# Patient Record
Sex: Female | Born: 1970
Health system: Southern US, Community
[De-identification: ages and names within clinical notes are randomized; demographics above are authoritative.]

## PROBLEM LIST (undated history)

## (undated) DIAGNOSIS — E7800 Pure hypercholesterolemia, unspecified: Secondary | ICD-10-CM

## (undated) DIAGNOSIS — R112 Nausea with vomiting, unspecified: Secondary | ICD-10-CM

## (undated) DIAGNOSIS — G4733 Obstructive sleep apnea (adult) (pediatric): Secondary | ICD-10-CM

## (undated) DIAGNOSIS — K449 Diaphragmatic hernia without obstruction or gangrene: Secondary | ICD-10-CM

## (undated) DIAGNOSIS — E282 Polycystic ovarian syndrome: Secondary | ICD-10-CM

## (undated) DIAGNOSIS — I10 Essential (primary) hypertension: Secondary | ICD-10-CM

## (undated) DIAGNOSIS — G43909 Migraine, unspecified, not intractable, without status migrainosus: Secondary | ICD-10-CM

## (undated) DIAGNOSIS — F419 Anxiety disorder, unspecified: Secondary | ICD-10-CM

## (undated) DIAGNOSIS — F329 Major depressive disorder, single episode, unspecified: Secondary | ICD-10-CM

## (undated) DIAGNOSIS — K219 Gastro-esophageal reflux disease without esophagitis: Secondary | ICD-10-CM

## (undated) DIAGNOSIS — K529 Noninfective gastroenteritis and colitis, unspecified: Secondary | ICD-10-CM

## (undated) DIAGNOSIS — Z9989 Dependence on other enabling machines and devices: Secondary | ICD-10-CM

## (undated) DIAGNOSIS — M1990 Unspecified osteoarthritis, unspecified site: Secondary | ICD-10-CM

## (undated) DIAGNOSIS — Z87820 Personal history of traumatic brain injury: Secondary | ICD-10-CM

## (undated) DIAGNOSIS — E785 Hyperlipidemia, unspecified: Secondary | ICD-10-CM

## (undated) DIAGNOSIS — G4730 Sleep apnea, unspecified: Secondary | ICD-10-CM

## (undated) DIAGNOSIS — Z9889 Other specified postprocedural states: Secondary | ICD-10-CM

## (undated) DIAGNOSIS — E119 Type 2 diabetes mellitus without complications: Secondary | ICD-10-CM

## (undated) DIAGNOSIS — K760 Fatty (change of) liver, not elsewhere classified: Secondary | ICD-10-CM

## (undated) DIAGNOSIS — F319 Bipolar disorder, unspecified: Secondary | ICD-10-CM

## (undated) DIAGNOSIS — I2510 Atherosclerotic heart disease of native coronary artery without angina pectoris: Secondary | ICD-10-CM

## (undated) DIAGNOSIS — R51 Headache: Secondary | ICD-10-CM

## (undated) DIAGNOSIS — K5730 Diverticulosis of large intestine without perforation or abscess without bleeding: Secondary | ICD-10-CM

## (undated) DIAGNOSIS — Z8719 Personal history of other diseases of the digestive system: Secondary | ICD-10-CM

## (undated) DIAGNOSIS — T4145XA Adverse effect of unspecified anesthetic, initial encounter: Secondary | ICD-10-CM

## (undated) HISTORY — PX: ANAL FISSURE REPAIR: SHX2312

## (undated) HISTORY — PX: KNEE ARTHROSCOPY: SUR90

## (undated) HISTORY — PX: LAPAROSCOPIC CHOLECYSTECTOMY: SUR755

## (undated) HISTORY — DX: Sleep apnea, unspecified: G47.30

## (undated) HISTORY — DX: Headache: R51

## (undated) HISTORY — PX: EVALUATION UNDER ANESTHESIA WITH TEAR DUCT PROBING: SHX5620

---

## 1992-07-26 HISTORY — PX: KNEE ARTHROSCOPY: SUR90

## 2002-06-20 ENCOUNTER — Ambulatory Visit (HOSPITAL_COMMUNITY): Admission: RE | Admit: 2002-06-20 | Discharge: 2002-06-20 | Payer: Self-pay | Admitting: General Surgery

## 2002-07-26 HISTORY — PX: CHOLECYSTECTOMY: SHX55

## 2002-08-06 ENCOUNTER — Ambulatory Visit (HOSPITAL_COMMUNITY): Admission: RE | Admit: 2002-08-06 | Discharge: 2002-08-06 | Payer: Self-pay | Admitting: General Surgery

## 2007-07-27 HISTORY — PX: BACK SURGERY: SHX140

## 2007-08-04 ENCOUNTER — Ambulatory Visit (HOSPITAL_COMMUNITY): Admission: RE | Admit: 2007-08-04 | Discharge: 2007-08-05 | Payer: Self-pay | Admitting: Neurosurgery

## 2007-10-04 ENCOUNTER — Encounter: Admission: RE | Admit: 2007-10-04 | Discharge: 2007-10-04 | Payer: Self-pay | Admitting: Neurosurgery

## 2007-11-20 ENCOUNTER — Emergency Department (HOSPITAL_COMMUNITY): Admission: EM | Admit: 2007-11-20 | Discharge: 2007-11-20 | Payer: Self-pay | Admitting: Emergency Medicine

## 2008-03-05 ENCOUNTER — Ambulatory Visit (HOSPITAL_COMMUNITY): Admission: RE | Admit: 2008-03-05 | Discharge: 2008-03-05 | Payer: Self-pay | Admitting: Neurology

## 2009-08-13 ENCOUNTER — Encounter: Admission: RE | Admit: 2009-08-13 | Discharge: 2009-08-13 | Payer: Self-pay | Admitting: Neurology

## 2010-12-08 NOTE — Op Note (Signed)
Jamie [name], Jamie [name]               ACCOUNT NO.:  [id number]   MEDICAL RECORD NO.:  [id number]          PATIENT TYPE:  AMB   LOCATION:  SDS                          FACILITY:  [facility]   PHYSICIAN:  Hilda Lias, M.D.   DATE OF BIRTH:  1971/06/25   DATE OF PROCEDURE:  08/04/2007  DATE OF DISCHARGE:                               OPERATIVE REPORT   PREOPERATIVE DIAGNOSES:  1. Right L4-L5 herniated disk with radiculopathy.  2. Morbid obesity.   POSTOPERATIVE DIAGNOSES:  1. Right L4-L5 herniated disk with radiculopathy.  2. Morbid obesity.   PROCEDURE:  1. Right L4-5 diskectomy.  2. Decompression of the L5 nerve root.  3. Microscope.   SURGEON:  Botero   ASSISTANT:  Dr. Newell Coral.   CLINICAL HISTORY:  [name] is a lady who came to see me in my office a  few days ago because of back pain rushing through the right leg.  The  patient has failed conservative treatment.  X-rays showed that she has a  herniated disk compromising the L5 nerve root.  The patient wanted to  proceed with surgery.  The risks were explained to her in the history  and physical.   PROCEDURE:  [name] was taken to the OR and she was positioned in a  prone manner.  The skin was prepped with DuraPrep.  It was difficult to  feel any spinous process.  Nevertheless, with an incision right below  the iliac crest line, incision was carried down through the skin,  subcutaneous tissues, throughout the deep adipose tissue about 3 inches  all the way down to the fascia.  Muscles and fascia were retracted  laterally.  X-ray showed __________ L4-5 and __________ L5-S1.  We  brought the microscope into the area and with the drill, we did a  laminotomy of L4-5.  The yellow ligament also was excised.  What we  found was that the nerve root was completely adherent to the floor.  There was a large herniated disk with __________ incorporated into one  calcified and one soft.  Incision was made and 3 fragments were removed.  We entered disk space and using the microcurette, we were able to remove  the part of the lateral calcified disk.  At the end, total gross  diskectomy was achieved.  We had plenty of room for the L4 and L5 nerve  root.  The area was irrigated and Valsalva maneuver was negative.  Fentanyl and Depo-Medrol were left in the epidural space and the wound  was closed with Vicryl and Steri-Strips.          ______________________________  Hilda Lias, M.D.    EB/MEDQ  D:  08/04/2007  T:  08/05/2007  Job:  [id number]

## 2010-12-11 NOTE — Op Note (Signed)
Jamie [name], GAVITT                         ACCOUNT NO.:  [id number]   MEDICAL RECORD NO.:  [id number]                   PATIENT TYPE:  AMB   LOCATION:  DAY                                  FACILITY:  [facility]   PHYSICIAN:  Dalia Heading, M.D.               DATE OF BIRTH:  June 07, 1971   DATE OF PROCEDURE:  08/06/2002  DATE OF DISCHARGE:                                 OPERATIVE REPORT   PREOPERATIVE DIAGNOSIS:  Chronic cholecystitis.   POSTOPERATIVE DIAGNOSIS:  Chronic cholecystitis.   PROCEDURE:  Laparoscopic cholecystectomy.   ASSISTANT:  Bernerd Limbo. Leona Carry, M.D.   ANESTHESIA:  General endotracheal.   INDICATIONS:  The patient is a 40 year old white female who presents with  nausea and vomiting secondary to chronic cholecystitis.  The risks and  benefits of the procedure, including bleeding, infection, hepatobiliary  injury, and the possibility of an open procedure were fully explained to the  patient, who gave informed consent.   DESCRIPTION OF PROCEDURE:  The patient was placed in the supine position.  After induction of general endotracheal anesthesia, the abdomen was prepped  and draped using the usual sterile technique with Betadine.   A supraumbilical incision was made down to the fascia.  A Veress needle was  introduced into the abdominal cavity and confirmation of placement was done  using the saline drop test.  The abdomen was then insufflated to 16 mmHg  pressure.  An 11-mm trocar was introduced into the abdominal cavity under  direct visualization without difficulty.  The patient was placed in reversed  Trendelenburg position and an additional 11-mm trocar was placed in the  epigastric region and 5-mm trocars were placed in the right upper quadrant  and right flank regions.  The liver was inspected and noted to be within  normal limits.  The gallbladder was retracted superiorly and laterally.  The  dissection was begun around the infundibulum of the gallbladder.   The cystic  duct was first identified.  Its junction to the infundibulum was well  identified.  Endo clips were placed proximally and distally on the cystic  duct and the cystic duct was divided.  This was likewise done on the cystic  artery.  The gallbladder was then freed away from the gallbladder fossa  using Bovie electrocautery.  The gallbladder was delivered through the  epigastric trocar site without difficulty.  The gallbladder fossa was  inspected and no abnormal bleeding or bile leakage was noted.  Surgicel was  placed on the gallbladder fossa.  The subhepatic space as well as the right  hepatic gutter were irrigated with normal saline.  All fluid and air were  then evacuated from the abdominal cavity prior to removal of the trocars.   All wounds were irrigated with normal saline.  All wounds were injected with  0.5% Sensorcaine.  The supraumbilical fascia was reapproximated using an 0  Vicryl interrupted suture.  All skin incisions were closed using staples.  Betadine ointment and dry sterile dressings were applied.   All instrument and needle counts were correct at the end of the procedure.  The patient was extubated in the operating room and went back to the  recovery room awake and in stable condition.   COMPLICATIONS:  None.   SPECIMEN:  Gallbladder.   BLOOD LOSS:  Minimal.                                                Dalia Heading, M.D.    MAJ/MEDQ  D:  08/06/2002  T:  08/06/2002  Job:  [id number]   cc:   [name]  [address] [place]  [place] [address]  Fax: [phone]

## 2010-12-11 NOTE — H&P (Signed)
   NAMEADALINA, Jamie [name]                           ACCOUNT NO.:  [id number]   MEDICAL RECORD NO.:  [id number]                  PATIENT TYPE:   LOCATION:                                       FACILITY:   PHYSICIAN:  Dalia Heading, M.D.               DATE OF BIRTH:   DATE OF ADMISSION:  DATE OF DISCHARGE:                                HISTORY & PHYSICAL   CHIEF COMPLAINT:  Chronic cholecystitis.   HISTORY OF PRESENT ILLNESS:  The patient is a 40 year old white female who  is referred for evaluation and treatment of chronic cholecystitis.  She has  been having increased nausea and intermittent right upper quadrant abdominal  pain recently.  This was occurring with all foods.  No fever, chills, or  jaundice had been noted.   PAST MEDICAL HISTORY:  1. Non-insulin-dependent diabetes mellitus.  2. Hypertension.   PAST SURGICAL HISTORY:  1. Knee surgery.  2. EGD and colonoscopy in November 2003.   CURRENT MEDICATIONS:  1. Altace 2.5 mg p.o. daily.  2. Nexium 40 mg p.o. daily.  3. Glucophage XR 500 mg p.o. two tablets once daily.   ALLERGIES:  CODEINE but she can take Vicodin without difficulty.   REVIEW OF SYSTEMS:  Noncontributory.   PHYSICAL EXAMINATION:  GENERAL:  The patient is a well-developed, well-  nourished white female in no acute distress.  VITAL SIGNS:  She is afebrile and vital signs are stable.  LUNGS:  Clear to auscultation with equal breath sounds bilaterally.  HEART:  Regular rate and rhythm without S3, S4, or murmurs.  ABDOMEN:  Slightly tender in the right upper quadrant to palpation.  No  hepatosplenomegaly, masses, or hernias are noted.   LABORATORY DATA:  Ultrasound of the gallbladder is negative.  Hepatobiliary  scan reveals chronic cholecystitis with a low gallbladder ejection fraction.   IMPRESSION:  Chronic cholecystitis.   PLAN:  The patient is scheduled for laparoscopic cholecystectomy on August 06, 2002.  The risks and benefits of the  procedure including bleeding,  infection, hepatobiliary injury, and the possibility of an open procedure  were fully explained to the patient, who gave informed consent.                                               Dalia Heading, M.D.    MAJ/MEDQ  D:  08/02/2002  T:  08/02/2002  Job:  [id number]   cc:   [name]  [address] [place]  [place] [address]  Fax: [phone]

## 2010-12-11 NOTE — H&P (Signed)
   Jamie [name], MATNEY                           ACCOUNT NO.:  [id number]   MEDICAL RECORD NO.:  [id number]                  PATIENT TYPE:   LOCATION:                                       FACILITY:   PHYSICIAN:  Dalia Heading, M.D.               DATE OF BIRTH:  05/03/1971   DATE OF ADMISSION:  DATE OF DISCHARGE:                                HISTORY & PHYSICAL   CHIEF COMPLAINT:  Hematochezia, hematemesis.   HISTORY OF PRESENT ILLNESS:  The patient is a 40 year old white female who  is referred for endoscopic evaluation.  She has been having episodes of  hematochezia for the past month.  Also, last week she had two episodes of  hematemesis.  She denies any lightheadedness, weight loss, fever, abdominal  pain, constipation, diarrhea, or melena.  She has never had a colonoscopy.  She denies any hemorrhoidal problems or immediate family history of colon  carcinoma.   PAST MEDICAL HISTORY:  Hypertension, non-insulin-dependent diabetes  mellitus.   PAST SURGICAL HISTORY:  Knee surgery.   CURRENT MEDICATIONS:  Altace, Glucophage.   ALLERGIES:  CODEINE.   REVIEW OF SYSTEMS:  Noncontributory.   PHYSICAL EXAMINATION:  GENERAL:  The patient is a well-developed, well-  nourished white female in no acute distress.  VITAL SIGNS:  She is afebrile and vital signs are stable.  LUNGS:  Clear to auscultation with equal breath sounds bilaterally.  HEART:  Regular rate and rhythm without S3, S4, or murmurs.  ABDOMEN:  Soft, nontender, nondistended.  No hepatosplenomegaly or masses  are noted.  RECTAL:  Deferred until the procedure.   IMPRESSION:  Hematochezia, hematemesis.    PLAN:  The patient was scheduled for an EGD and colonoscopy on June 20, 2002.  The risks and benefits of the procedures including bleeding and  perforation were fully explained to the patient, gave informed consent.                                               Dalia Heading, M.D.    MAJ/MEDQ  D:   06/14/2002  T:  06/14/2002  Job:  [id number]   cc:   Donzetta Sprung  [address]  [place]  [place] [address]  Fax: [phone]

## 2010-12-22 DIAGNOSIS — K219 Gastro-esophageal reflux disease without esophagitis: Secondary | ICD-10-CM | POA: Insufficient documentation

## 2011-04-15 LAB — CBC
HCT: 42.3
MCHC: 34.6
MCV: 83
Platelets: 245
RDW: 14.2

## 2011-04-15 LAB — BASIC METABOLIC PANEL
BUN: 14
CO2: 25
Calcium: 9.7
GFR calc Af Amer: >60
Glucose, Bld: 121 — ABNORMAL HIGH

## 2011-04-20 LAB — PREGNANCY, URINE: Preg Test, Ur: NEGATIVE

## 2011-04-20 LAB — URINALYSIS, ROUTINE W REFLEX MICROSCOPIC
Glucose, UA: NEGATIVE
Specific Gravity, Urine: >1.03 — ABNORMAL HIGH

## 2011-04-20 LAB — URINE MICROSCOPIC-ADD ON

## 2011-04-23 LAB — CREATININE, SERUM: GFR calc non Af Amer: >60

## 2011-04-23 LAB — BUN: BUN: 11

## 2011-06-07 ENCOUNTER — Encounter: Payer: Self-pay | Admitting: *Deleted

## 2011-06-07 ENCOUNTER — Ambulatory Visit (HOSPITAL_COMMUNITY)
Admission: RE | Admit: 2011-06-07 | Discharge: 2011-06-07 | Disposition: A | Discharge status: Home / Self Care | Payer: Medicare Other | Attending: Psychiatry | Admitting: Psychiatry

## 2011-06-07 ENCOUNTER — Emergency Department (HOSPITAL_COMMUNITY)
Admission: EM | Admit: 2011-06-07 | Discharge: 2011-06-08 | Disposition: A | Discharge status: Other Acute Inpatient Hospital | Payer: Medicare Other | Source: Home / Self Care | Attending: Emergency Medicine | Admitting: Emergency Medicine

## 2011-06-07 DIAGNOSIS — E7800 Pure hypercholesterolemia, unspecified: Secondary | ICD-10-CM | POA: Insufficient documentation

## 2011-06-07 DIAGNOSIS — R45851 Suicidal ideations: Secondary | ICD-10-CM | POA: Insufficient documentation

## 2011-06-07 DIAGNOSIS — F341 Dysthymic disorder: Secondary | ICD-10-CM | POA: Insufficient documentation

## 2011-06-07 DIAGNOSIS — I10 Essential (primary) hypertension: Secondary | ICD-10-CM | POA: Insufficient documentation

## 2011-06-07 DIAGNOSIS — F319 Bipolar disorder, unspecified: Secondary | ICD-10-CM | POA: Insufficient documentation

## 2011-06-07 DIAGNOSIS — F332 Major depressive disorder, recurrent severe without psychotic features: Secondary | ICD-10-CM | POA: Insufficient documentation

## 2011-06-07 DIAGNOSIS — Z79899 Other long term (current) drug therapy: Secondary | ICD-10-CM | POA: Insufficient documentation

## 2011-06-07 HISTORY — DX: Essential (primary) hypertension: I10

## 2011-06-07 HISTORY — DX: Major depressive disorder, single episode, unspecified: F32.9

## 2011-06-07 HISTORY — DX: Anxiety disorder, unspecified: F41.9

## 2011-06-07 HISTORY — DX: Bipolar disorder, unspecified: F31.9

## 2011-06-07 HISTORY — DX: Pure hypercholesterolemia, unspecified: E78.00

## 2011-06-07 LAB — CBC
HCT: 38.7 % (ref 36.0–46.0)
MCHC: 33.1 g/dL (ref 30.0–36.0)
MCV: 88.6 fL (ref 78.0–100.0)
RDW: 14.2 % (ref 11.5–15.5)

## 2011-06-07 NOTE — ED Notes (Signed)
Pt in from [facility], requesting medical clearance, pt c/o SI x2 months and increased today, pt states she was recently at [facility]

## 2011-06-07 NOTE — BH Assessment (Signed)
Assessment Note   Jamie [name] is an 40 y.o. female who presents to [facility] for assessment due to suicidal ideation. Patient states that she broke up with her boyfriend 2 months ago and has since had "overwhelming" suicidal thoughts. She states that she has been admitted to [facility] inpatient twice in the past 2 months. She states that each time was due to her feeling overwhelmed with wanting to commit suicide. Patient states that she has never attempted suicide but states that she "wants to die." Patient has no current plan but stated that she has access to a lot of medication. Patient reports that she lives alone. She states that she receives social from her sister, mother, and best friend. Patient reports having a decreased appetite, stating that she has lost 40 lbs in the past 12 months, 20 lbs of which were in the last 2 months. Patient states she has been unable to sleep, stating she is currently getting between 4 and 6 hours a night. Patient denies any homicidal thoughts, any previous abuse, substance abuse, and visiual hallucinations. Patient reports that she use to have auditory hallucinations 2 years ago but has since been put on medication. She states she has not had any auditory hallucinations since that time. Patient was unable to contract for safety. She also states that she is not comfortable with intensive outpatient therapy, stating she feels she needs more structure and supervision at this time.       Axis I: Major Depression, Recurrent severe Axis II: Deferred Axis III: No past medical history on file. Axis IV: problems related to social environment Axis V: 31-40 impairment in reality testing  Past Medical History: No past medical history on file.  No past surgical history on file.  Family History: No family history on file.  Social History:  does not have a smoking history on file. She does not have any smokeless tobacco history on file. Her alcohol and drug histories  not on file.  Allergies:  Allergies  Allergen Reactions  . Codeine     Home Medications:  No current outpatient prescriptions on file as of 06/07/2011.   No current facility-administered medications on file as of 06/07/2011.    OB/GYN Status:  No LMP recorded.  General Assessment Data Assessment Number: 1  Living Arrangements: Alone Can pt return to current living arrangement?: Yes Admission Status: Voluntary Is patient capable of signing voluntary admission?: Yes Transfer from: Home Referral Source: Psychiatrist  Risk to self Suicidal Ideation: Yes-Currently Present Suicidal Intent: Yes-Currently Present Is patient at risk for suicide?: Yes Suicidal Plan?: No (s) Access to Means: Yes Specify Access to Suicidal Means: medication What has been your use of drugs/alcohol within the last 12 months?: none Other Self Harm Risks: none known Triggers for Past Attempts: Other personal contacts (break up with boyfriend) Intentional Self Injurious Behavior: None Factors that decrease suicide risk: Positive social support Family Suicide History: Unknown Recent stressful life event(s): Other (Comment) (break up with boyfriend) Persecutory voices/beliefs?: No Depression: Yes Depression Symptoms: Tearfulness;Loss of interest in usual pleasures Substance abuse history and/or treatment for substance abuse?: No Suicide prevention information given to non-admitted patients: Not applicable  Risk to Others Homicidal Ideation: No Thoughts of Harm to Others: No Current Homicidal Intent: No Current Homicidal Plan: No Access to Homicidal Means: No Identified Victim: none History of harm to others?: No Assessment of Violence: None Noted Violent Behavior Description: none Does patient have access to weapons?: No Criminal Charges Pending?: No Does  patient have a court date: No  Mental Status Report Appear/Hygiene: Disheveled Eye Contact: Fair Motor Activity:  (slowed) Speech:  Logical/coherent Level of Consciousness: Alert;Drowsy Mood: Depressed (hopelessness) Affect:  (flat) Anxiety Level: None Thought Processes: Coherent;Relevant Judgement: Unimpaired Orientation: Person;Place;Time;Situation Obsessive Compulsive Thoughts/Behaviors: None  Cognitive Functioning Concentration: Normal Memory: Recent Intact IQ: Average Insight: Poor Impulse Control: Poor Appetite: Poor Weight Loss: 40  Weight Gain: 0  Sleep: Decreased Total Hours of Sleep: 4  Vegetative Symptoms: None  Prior Inpatient/Outpatient Therapy Prior Therapy: Inpatient Prior Therapy Dates: last 2 months Prior Therapy Facilty/Provider(s): [facility] Reason for Treatment: sucidial ideation  ADL Screening (condition at time of admission) Patient's cognitive ability adequate to safely complete daily activities?: Yes Patient able to express need for assistance with ADLs?: Yes Independently performs ADLs?: Yes Weakness of Legs: None Weakness of Arms/Hands: None  Home Assistive Devices/Equipment Home Assistive Devices/Equipment: None    Abuse/Neglect Assessment (Assessment to be complete while patient is alone) Physical Abuse: Denies Verbal Abuse: Denies Sexual Abuse: Denies Exploitation of patient/patient's resources: Denies Self-Neglect: Denies Values / Beliefs Cultural Requests During Hospitalization: None Spiritual Requests During Hospitalization: None        Additional Information 1:1 In Past 12 Months?: No CIRT Risk: No Elopement Risk: No Does patient have medical clearance?: No     Disposition:  Disposition Disposition of Patient: Referred to [facility] for medical clearence ) Patient referred to:  [facility] for medical clearence)  Discussed patient with Thurman Coyer who cleared patient for Dr. Dan Humphreys at 21:00. Dr.Walker advised patient  Be transported to [facility] for medical clearance at 21:15.     On Site Evaluation by:   Reviewed with Physician:      Marjean Donna 06/07/2011 10:19 PM

## 2011-06-08 ENCOUNTER — Encounter (HOSPITAL_COMMUNITY): Payer: Self-pay | Admitting: *Deleted

## 2011-06-08 ENCOUNTER — Inpatient Hospital Stay (HOSPITAL_COMMUNITY)
Admission: AD | Admit: 2011-06-08 | Discharge: 2011-06-10 | DRG: 885 | Disposition: A | Discharge status: Home / Self Care | Payer: Medicare Other | Source: Ambulatory Visit | Attending: Psychiatry | Admitting: Psychiatry

## 2011-06-08 DIAGNOSIS — Z9181 History of falling: Secondary | ICD-10-CM

## 2011-06-08 DIAGNOSIS — E7800 Pure hypercholesterolemia, unspecified: Secondary | ICD-10-CM

## 2011-06-08 DIAGNOSIS — R51 Headache: Secondary | ICD-10-CM

## 2011-06-08 DIAGNOSIS — F319 Bipolar disorder, unspecified: Secondary | ICD-10-CM

## 2011-06-08 DIAGNOSIS — F3289 Other specified depressive episodes: Secondary | ICD-10-CM

## 2011-06-08 DIAGNOSIS — F251 Schizoaffective disorder, depressive type: Secondary | ICD-10-CM

## 2011-06-08 DIAGNOSIS — F259 Schizoaffective disorder, unspecified: Principal | ICD-10-CM

## 2011-06-08 DIAGNOSIS — M549 Dorsalgia, unspecified: Secondary | ICD-10-CM

## 2011-06-08 DIAGNOSIS — R45851 Suicidal ideations: Secondary | ICD-10-CM

## 2011-06-08 DIAGNOSIS — F411 Generalized anxiety disorder: Secondary | ICD-10-CM | POA: Diagnosis present

## 2011-06-08 DIAGNOSIS — F329 Major depressive disorder, single episode, unspecified: Secondary | ICD-10-CM

## 2011-06-08 DIAGNOSIS — I10 Essential (primary) hypertension: Secondary | ICD-10-CM

## 2011-06-08 DIAGNOSIS — Z87820 Personal history of traumatic brain injury: Secondary | ICD-10-CM

## 2011-06-08 LAB — COMPREHENSIVE METABOLIC PANEL
ALT: 10 U/L (ref 0–35)
Albumin: 3.8 g/dL (ref 3.5–5.2)
Alkaline Phosphatase: 52 U/L (ref 39–117)
Potassium: 4.4 mEq/L (ref 3.5–5.1)
Sodium: 139 mEq/L (ref 135–145)
Total Protein: 7 g/dL (ref 6.0–8.3)

## 2011-06-08 LAB — RAPID URINE DRUG SCREEN, HOSP PERFORMED
Amphetamines: NOT DETECTED
Barbiturates: NOT DETECTED
Cocaine: NOT DETECTED
Tetrahydrocannabinol: NOT DETECTED

## 2011-06-08 MED ORDER — MAGNESIUM HYDROXIDE 400 MG/5ML PO SUSP
30.0000 mL | Freq: Every day | ORAL | Status: DC | PRN
Start: 2011-06-08 — End: 2011-06-10

## 2011-06-08 MED ORDER — ALUM & MAG HYDROXIDE-SIMETH 200-200-20 MG/5ML PO SUSP: 30.0000 mL | ORAL | Status: DC | PRN

## 2011-06-08 MED ORDER — METFORMIN HCL 500 MG PO TABS: 2000.0000 mg | ORAL_TABLET | Freq: Every day | ORAL | Status: DC

## 2011-06-08 MED ORDER — ACETAMINOPHEN 325 MG PO TABS: 650.0000 mg | ORAL_TABLET | Freq: Four times a day (QID) | ORAL | Status: DC | PRN

## 2011-06-08 MED ORDER — CARBAMAZEPINE 200 MG PO TABS
200.0000 mg | ORAL_TABLET | Freq: Two times a day (BID) | ORAL | Status: DC
  Administered 2011-06-08 – 2011-06-10 (×4): 200 mg via ORAL
  Filled 2011-06-08 (×8): qty 1

## 2011-06-08 MED ORDER — CARBAMAZEPINE ER 200 MG PO CP12: 200.0000 mg | ORAL_CAPSULE | Freq: Two times a day (BID) | ORAL | Status: DC

## 2011-06-08 MED ORDER — ONDANSETRON 4 MG PO TBDP
4.0000 mg | ORAL_TABLET | Freq: Once | ORAL | Status: AC
  Administered 2011-06-08: 4 mg via ORAL
  Filled 2011-06-08: qty 1

## 2011-06-08 MED ORDER — PROPRANOLOL HCL 40 MG PO TABS
40.0000 mg | ORAL_TABLET | Freq: Three times a day (TID) | ORAL | Status: DC
  Administered 2011-06-08: 40 mg via ORAL

## 2011-06-08 MED ORDER — TOPIRAMATE 25 MG PO TABS
50.0000 mg | ORAL_TABLET | ORAL | Status: DC
  Administered 2011-06-08 – 2011-06-10 (×5): 50 mg via ORAL
  Filled 2011-06-08 (×2): qty 2
  Filled 2011-06-08: qty 1
  Filled 2011-06-08 (×2): qty 2
  Filled 2011-06-08: qty 1
  Filled 2011-06-08 (×2): qty 2

## 2011-06-08 MED ORDER — LISINOPRIL 20 MG PO TABS
20.0000 mg | ORAL_TABLET | Freq: Every day | ORAL | Status: DC
  Administered 2011-06-08 – 2011-06-10 (×3): 20 mg via ORAL
  Filled 2011-06-08 (×4): qty 1

## 2011-06-08 MED ORDER — CLONAZEPAM 1 MG PO TABS: 1.0000 mg | ORAL_TABLET | Freq: Two times a day (BID) | ORAL | Status: DC | PRN

## 2011-06-08 MED ORDER — CARBAMAZEPINE ER 200 MG PO CP12: 200.0000 mg | ORAL_CAPSULE | ORAL | Status: DC

## 2011-06-08 MED ORDER — METFORMIN HCL ER 500 MG PO TB24
2000.0000 mg | ORAL_TABLET | Freq: Every day | ORAL | Status: DC
  Administered 2011-06-08 – 2011-06-10 (×3): 2000 mg via ORAL
  Filled 2011-06-08 (×3): qty 4
  Filled 2011-06-08 (×2): qty 12
  Filled 2011-06-08: qty 4

## 2011-06-08 MED ORDER — PANTOPRAZOLE SODIUM 40 MG PO TBEC
40.0000 mg | DELAYED_RELEASE_TABLET | Freq: Every day | ORAL | Status: DC
  Administered 2011-06-08 – 2011-06-09 (×2): 40 mg via ORAL
  Filled 2011-06-08 (×3): qty 1

## 2011-06-08 MED ORDER — ROSIGLITAZONE-METFORMIN 2-1000 MG PO TABS: 1.0000 | ORAL_TABLET | Freq: Two times a day (BID) | ORAL | Status: DC

## 2011-06-08 MED ORDER — BUPROPION HCL ER (SR) 150 MG PO TB12
150.0000 mg | ORAL_TABLET | ORAL | Status: DC
  Administered 2011-06-08 – 2011-06-10 (×5): 150 mg via ORAL
  Filled 2011-06-08 (×2): qty 6
  Filled 2011-06-08: qty 1
  Filled 2011-06-08: qty 6
  Filled 2011-06-08 (×2): qty 1
  Filled 2011-06-08: qty 6
  Filled 2011-06-08 (×2): qty 1

## 2011-06-08 MED ORDER — MAGNESIUM HYDROXIDE 400 MG/5ML PO SUSP: 30.0000 mL | Freq: Every day | ORAL | Status: DC | PRN

## 2011-06-08 MED ORDER — PROPRANOLOL HCL 40 MG PO TABS
40.0000 mg | ORAL_TABLET | ORAL | Status: DC
  Administered 2011-06-08 – 2011-06-10 (×7): 40 mg via ORAL
  Filled 2011-06-08 (×12): qty 1

## 2011-06-08 MED ORDER — ROSUVASTATIN CALCIUM 20 MG PO TABS
20.0000 mg | ORAL_TABLET | Freq: Every day | ORAL | Status: DC
  Administered 2011-06-08 – 2011-06-10 (×3): 20 mg via ORAL
  Filled 2011-06-08 (×5): qty 1

## 2011-06-08 MED ORDER — ESCITALOPRAM OXALATE 10 MG PO TABS
10.0000 mg | ORAL_TABLET | Freq: Every day | ORAL | Status: DC
  Administered 2011-06-08 – 2011-06-10 (×3): 10 mg via ORAL
  Filled 2011-06-08 (×7): qty 1

## 2011-06-08 MED ORDER — ZIPRASIDONE HCL 60 MG PO CAPS
60.0000 mg | ORAL_CAPSULE | Freq: Two times a day (BID) | ORAL | Status: DC
  Administered 2011-06-08 – 2011-06-10 (×4): 60 mg via ORAL
  Filled 2011-06-08 (×7): qty 1

## 2011-06-08 NOTE — ED Notes (Signed)
Pt signed E signature to transfer to [facility]. Pt alert and oriented x4, ambulatory. Respirations even and unlabored. In no acute distress. Skin warm and dry. Denies needs.

## 2011-06-08 NOTE — ED Provider Notes (Signed)
History     CSN: [id number] Arrival date & time: 06/07/2011 11:04 PM   First MD Initiated Contact with Patient 06/08/11 0025      Chief Complaint  Patient presents with  . Medical Clearance    (Consider location/radiation/quality/duration/timing/severity/associated sxs/prior treatment) HPI Comments: Patient with chronic suicidal thoughts sees a psychiatrics and therapist on a regular basis was recently at [facility], discharged 10 24/12 Went to [facility] and sent here for labs and medical clerance  The history is provided by the patient.    Past Medical History  Diagnosis Date  . Hypertension   . High cholesterol   . Bipolar 1 disorder   . Depression   . Anxiety     History reviewed. No pertinent past surgical history.  History reviewed. No pertinent family history.  History  Substance Use Topics  . Smoking status: Never Smoker   . Smokeless tobacco: Not on file  . Alcohol Use: No    OB History    Grav Para Term Preterm Abortions TAB SAB Ect Mult Living                  Review of Systems  Constitutional: Negative.   HENT: Negative.   Eyes: Negative.   Respiratory: Negative.   Cardiovascular: Negative.   Genitourinary: Negative.   Musculoskeletal: Negative.   Neurological: Negative.   Hematological: Negative.   Psychiatric/Behavioral: Positive for suicidal ideas.    Allergies  Codeine  Home Medications   Current Outpatient Rx  Name Route Sig Dispense Refill  . BUPROPION HCL 100 MG PO TABS Oral Take 100 mg by mouth 2 (two) times daily.      [name] Kitchen CARBAMAZEPINE (ANTIPSYCHOTIC) 200 MG PO CP12 Oral Take 200 mg by mouth 2 (two) times daily.      [name] Kitchen CLONAZEPAM 1 MG PO TABS Oral Take 1 mg by mouth 2 (two) times daily as needed.      [name] Kitchen ESCITALOPRAM OXALATE 10 MG PO TABS Oral Take 10 mg by mouth daily.      [name] Kitchen LISINOPRIL 20 MG PO TABS Oral Take 20 mg by mouth daily.      [name] Kitchen METFORMIN HCL 500 MG PO TABS Oral Take 500 mg by mouth 2 (two) times daily with a meal.        . OMEPRAZOLE 20 MG PO CPDR Oral Take 20 mg by mouth daily.      [name] Kitchen PROPRANOLOL HCL 40 MG PO TABS Oral Take 40 mg by mouth 3 (three) times daily.      [name] Kitchen ROSUVASTATIN CALCIUM 20 MG PO TABS Oral Take 20 mg by mouth daily.      [name] Kitchen TIZANIDINE HCL 2 MG PO CAPS Oral Take 2 mg by mouth 3 (three) times daily.      . TOPIRAMATE 25 MG PO CPSP Oral Take 25 mg by mouth 2 (two) times daily.      . TRAZODONE HCL 100 MG PO TABS Oral Take 100 mg by mouth at bedtime.      [name] Kitchen ZIPRASIDONE HCL 60 MG PO CAPS Oral Take 60 mg by mouth 2 (two) times daily with a meal.      . ZOLPIDEM TARTRATE 10 MG PO TABS Oral Take 10 mg by mouth at bedtime as needed.      . BUPROPION HCL 100 MG PO TABS Oral Take 100 mg by mouth 2 (two) times daily.      [name] Kitchen CARBAMAZEPINE 200 MG PO TABS Oral Take 200 mg by mouth 2 (two)  times daily.      [name] Kitchen CLONAZEPAM 1 MG PO TABS Oral Take 1 mg by mouth 3 (three) times daily.      [name] Kitchen ESCITALOPRAM OXALATE 10 MG PO TABS Oral Take 10 mg by mouth daily.      [name] Kitchen LISINOPRIL 20 MG PO TABS Oral Take 20 mg by mouth daily.      [name] Kitchen OMEPRAZOLE 20 MG PO CPDR Oral Take 20 mg by mouth daily.      [name] Kitchen PROPRANOLOL HCL 40 MG PO TABS Oral Take 40 mg by mouth 2 (two) times daily.      [name] Kitchen ROSIGLITAZONE-METFORMIN 08-998 MG PO TABS Oral Take 1 tablet by mouth 2 (two) times daily with a meal.      . ROSUVASTATIN CALCIUM 20 MG PO TABS Oral Take 20 mg by mouth daily.      [name] Kitchen TIZANIDINE HCL 2 MG PO CAPS Oral Take 2 mg by mouth 3 (three) times daily.      . TOPIRAMATE 25 MG PO TABS Oral Take 50 mg by mouth 2 times daily at 12 noon and 4 pm.      . ZIPRASIDONE HCL 60 MG PO CAPS Oral Take 60 mg by mouth 2 (two) times daily with a meal.      . ZOLPIDEM TARTRATE 10 MG PO TABS Oral Take 10 mg by mouth at bedtime as needed.        BP 125/84  Pulse 76  Temp(Src) 99.4 F (37.4 C) (Oral)  Resp 20  SpO2 96%  Physical Exam  Constitutional: She is oriented to person, place, and time. She appears well-developed and well-nourished.  HENT:   Head: Atraumatic.  Eyes: EOM are normal.  Cardiovascular: Regular rhythm.   Pulmonary/Chest: Breath sounds normal.  Musculoskeletal: Normal range of motion.  Neurological: She is oriented to person, place, and time.  Skin: Skin is warm and dry.  Psychiatric: She has a normal mood and affect.    ED Course  Procedures (including critical care time)  Labs Reviewed  COMPREHENSIVE METABOLIC PANEL - Abnormal; Notable for the following:    Total Bilirubin 0.1 (*)    All other components within normal limits  CBC  ETHANOL  URINE RAPID DRUG SCREEN (HOSP PERFORMED)  POCT PREGNANCY, URINE  POCT PREGNANCY, URINE   No results found.   No diagnosis found.    MDM  suicidial        Arman Filter, NP 06/08/11 1478

## 2011-06-08 NOTE — ED Notes (Signed)
Pt c/o nausea, NP notified with orders received, upon entering room pt noted to be diaphoretic, pt sat back in bed, given cool cloth, VS obtained and WNL, pt given zofran as ordered, will reassess

## 2011-06-08 NOTE — Progress Notes (Signed)
[facility] Group Notes:  (Counselor/Nursing/MHT/Case Management/Adjunct)  06/08/2011 3:44 PM  Type of Therapy:  Group Therapy  Participation Level:  None  Participation Quality:  Attentive  Affect:  Depressed  Cognitive:  Appropriate  Insight:  None  Engagement in Group:  Limited  Engagement in Therapy:  None  Modes of Intervention:  Support and Exploration  Summary of Progress/Problems: Jamie [name] was attentive but not engaged in group processing. She later stated that she does nol like group work because when she hears others talking about their problems she gets worried about them and doesn't focus on herself.    [name] 06/08/2011, 3:44 PM

## 2011-06-08 NOTE — Progress Notes (Addendum)
  40 yo white female voluntary admission.  Had been in [facility] twice in the last  Two months for two weeks at a time.  Was discharged 05/19/11.   She has chronic SI thought that have increased the last two days. Sister brought her to hospital.  Stated that she has been  Depressed since her break-up two months ago. Says that she hears voices ( none at this time).  [place] Spaniel that this is her 1st time being here.  On admission she was come and cooperative, no c/o distress.   She has a H/O  HTN,High  Cholesterol, Bipolar 1, depresseion, anxiety,  And is on diabetic medication.

## 2011-06-08 NOTE — ED Provider Notes (Signed)
Medical screening examination/treatment/procedure(s) were performed by non-physician practitioner and as supervising physician I was immediately available for consultation/collaboration. Devoria Albe, MD, [facility] Gang   Ward Givens, MD 06/08/11 [phone]

## 2011-06-08 NOTE — ED Provider Notes (Signed)
Per [facility] patient has been accepted at [facility] by Dr Reading. PT is in room with her mother, made aware of disposition and she is agreeable.   Ward Givens, MD 06/08/11 [phone]
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
## 2011-06-08 NOTE — Progress Notes (Addendum)
  Patient has been cooperative and pleasant.  Denied SI and HI.   Denied A/V hallucinations.  Denied pain.  Went to group this afternoon. Patient filled out self inventory sheet.  Has poor appetite, low energy level, good attention span.  Rated depression and hopelessness as #6.  Positive for SI, contracted for safety.  Denied SI while talking to nurse.  Has felt nausea today but no problems when talking to nurse.  Plans to use coping skills to deal with depression.  No problems taking medications after discharge.

## 2011-06-08 NOTE — Progress Notes (Signed)
[facility] Group Notes:  (Counselor/Nursing/MHT/Case Management/Adjunct)  06/08/2011 2:13 PM  Type of Therapy:  Psychoeducational Skills  Participation Level:  None  Participation Quality:  Attentive  Affect:  Flat  Cognitive:  Alert and Oriented  Insight:  None  Engagement in Group:  None  Engagement in Therapy:  None  Modes of Intervention:  Education, Problem-solving and Support  Summary of Progress/Problems: Jamie [name] observed the group session but did not get involved with the process of the group.  She was admitted right before group started and may still be trying to adjust to the atmosphere and her reason for admittance.   [name] 06/08/2011, 2:13 PM  Cosigned by: Angus Palms, LCSW

## 2011-06-08 NOTE — Progress Notes (Signed)
Jamie [name] is a new admit to the unit today.  She reports that she has attended groups today and participated with unit activities.  She denies SI/HI/AV at this time.  She has had increasing depression since breaking up with her boyfriend in September.  She has been at [facility] twice in the last few months.  This is her first admission at [facility].  Pt is soft-spoken and cooperative.  Pt is a fall risk as she had a recent fall in the shower with head trauma.  Pt voices no needs at this time.  Encouraged to make needs known to staff.  Safety maintained with q15 minute checks.
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
## 2011-06-09 DIAGNOSIS — F411 Generalized anxiety disorder: Secondary | ICD-10-CM | POA: Diagnosis present

## 2011-06-09 MED ORDER — TRAZODONE HCL 100 MG PO TABS
200.0000 mg | ORAL_TABLET | Freq: Every day | ORAL | Status: DC
  Administered 2011-06-09: 200 mg via ORAL
  Filled 2011-06-09 (×3): qty 2

## 2011-06-09 MED ORDER — ZOLPIDEM TARTRATE 10 MG PO TABS
10.0000 mg | ORAL_TABLET | Freq: Every day | ORAL | Status: DC
  Administered 2011-06-09: 10 mg via ORAL
  Filled 2011-06-09: qty 1

## 2011-06-09 MED ORDER — ONDANSETRON HCL 4 MG PO TABS
4.0000 mg | ORAL_TABLET | Freq: Three times a day (TID) | ORAL | Status: DC | PRN
  Administered 2011-06-09: 4 mg via ORAL
  Filled 2011-06-09: qty 1

## 2011-06-09 NOTE — Progress Notes (Addendum)
Patient seen during during d/c planning group and or treatment team.  She advised of admitting to the hospital due to [facility] following break up with boyfriend.  She denies any SI/HI today.  She rates her depression at six and anxiety at five.  She rates hopelessness at two and helplessness at five.  She advised of being followed by [facility] and [facility] for outpatient services.  Mccall reports living alone and plans to return to her home at discharge.  She advised her family lives near by and they along with friends are very supportive.  She endorses A/H at times but at this time.

## 2011-06-09 NOTE — Progress Notes (Signed)
Suicide Risk Assessment  Admission Assessment     Demographic factors:  Assessment Details Time of Assessment: Admission Information Obtained From: Patient Current Mental Status:  Current Mental Status: Suicidal ideation indicated by patient;Self-harm thoughts Loss Factors:  Loss Factors: Loss of significant relationship Historical Factors:  Historical Factors: Family history of mental illness or substance abuse Risk Reduction Factors:  Risk Reduction Factors: Sense of responsibility to family;Religious beliefs about death;Positive social support;Positive therapeutic relationship  CLINICAL FACTORS:   Severe Anxiety and/or Agitation Depression:   Anhedonia Hopelessness Impulsivity More than one psychiatric diagnosis Previous Psychiatric Diagnoses and Treatments  COGNITIVE FEATURES THAT CONTRIBUTE TO RISK:  Polarized thinking    Diagnosis:  Axis I: Schizoaffective Disorder - Depressed Type Generalized Anxiety Disorder   The patient was seen today and reports the following:  ADL's: Intact  Sleep: The patient reports to sleeping reasonably well last night. Appetite: The patient reports a decreased appetite.  Mild>(1-10) >Severe  Hopelessness (1-10): 6  Depression (1-10):  6  Anxiety (1-10): 5  Suicidal Ideation: The patient denies any suicidal ideations today but reports they were present prior to admission  Plan: No  Intent: No  Means: No Homicidal Ideation: The patient adamantly denies any homicidal ideations.  Plan: No  Intent: No.  Means: No   Motor Behavior: Normal Level of Consciousness: Alert  Speech: Normal  Eye Contact: Good  General Appearance /Behavior: Neat and Casual  Mental Status: AO x 3  Mood: Depressed - Moderate.  Affect: Moderately Constricted  Anxiety Level: Moderate.  Thought Process: Coherent  Thought Content: WNL. No auditory or visual hallucinations or delusional thinking.  Perception: Normal  Judgment: Fair to Good.  Insight: Fair to  Good  Cognition: Orientation time, place and person   Treatment Plan Summary:  1. Daily contact with patient to assess and evaluate symptoms and progress in treatment  2. Medication management  3. The patient will deny suicidal ideations or homicidal ideations for 48 hours prior to discharge and have a depression and anxiety rating of 3 or less. The patient will also deny any auditory or visual hallucinations or delusional thinking.   Plan:  1. The patient was continued on her current medications.  2. The medication Trazodone will be restarted at 200 mgs po qhs as well as Ambien 10 mgs po qhs for sleep as prescribed prior to admission. 3. Will continue to monitor.   SUICIDE RISK:    Mild:  Suicidal ideation of limited frequency, intensity, duration, and specificity.  There are no identifiable plans, no associated intent, mild dysphoria and related symptoms, good self-control (both objective and subjective assessment), few other risk factors, and identifiable protective factors, including available and accessible social support.   Jamie [name] 06/09/2011, 3:09 PM

## 2011-06-09 NOTE — Progress Notes (Signed)
Interdisciplinary Treatment Plan Update (Adult)  Date:  06/09/2011  Time Reviewed:  10:04 AM   Progress in Treatment: Attending groups:   Yes   Participating in groups:  Yes Taking medication as prescribed:  Yes Tolerating medication:  Yes Family/Significant othe contact made:  Patient understands diagnosis:  Yes Discussing patient identified problems/goals with staff: Yes Medical problems stabilized or resolved: Yes Denies suicidal/homicidal ideation:  Jamie [name] denies SI today Issues/concerns per patient self-inventory: Focus on triggers and avoiding if possible Other:  New problem(s) identified:  Reason for Continuation of Hospitalization: Anxiety Depression Medication stabilization  Interventions implemented related to continuation of hospitalization:  Medication Management; safety checks q 15 mins Additional comments:  Estimated length of stay: 2-3 days  Discharge Plan:  Home alone with follow-up with [facility] and [facility]  New goal(s):  Review of initial/current patient goals per problem list:   1.  Goal(s):  Eliminate SI  Met:  Yes  Target date:D/C  As evidenced by: Jamie [name] is denying SI today  2.  Goal (s): Reduce depression/anxiety  Met:  Yes  Jamie [name] reports depression rating at one today - rated at seven on admission.  She rates her anxiety at six today  Target date: D/C As evidenced by:  Anxiety rating to be reduced to a lower level  3.  Goal(s):Stabilize on meds  Met:  n0  Target date:  D/c  As evidenced by: Report that medications are working   4.  Goal(s):  MetTarget date:  As evidenced by:  Attendees: Patient:     Family:     Physician:  Franchot Gallo, MD 06/09/2011 10:04 AM   Nursing:    06/09/2011 10:04 AM   CaseManager:  Juline Patch, LCSW 06/09/2011 10:04 AM   Counselor:  Angus Palms, LCSW 06/09/2011 10:04 AM    06/09/2011 10:04 AM   Other:     Other:     Other:      Scribe for Treatment Team:     Wynn Banker, LCSW,  06/09/2011 10:04 AM

## 2011-06-09 NOTE — Progress Notes (Signed)
[facility] Group Notes:  (Counselor/Nursing/MHT/Case Management/Adjunct)  06/09/2011 3:33 PM  Type of Therapy:  Group Therapy  Participation Level:  Active  Participation Quality:  Attentive  Affect:  Appropriate  Cognitive:  Alert and Appropriate  Insight:    Engagement in Group:  Good  Engagement in Therapy:  Good  Modes of Intervention:  Support and Exploration  Summary of Progress/Problems: Jamie [name] participated in discussion of anxiety, and reported that she relates fear and being overwhelmed with the emotion. She talked about the need to calm herself before she gets into a panic but wasn't sure how to do so. She seemed receptive to the ideas of deep breathing and grounding techniques.   [name] 06/09/2011, 3:33 PM

## 2011-06-09 NOTE — Progress Notes (Signed)
  Pt is awake and active on the unit this AM.  Pt states that she slept well last night and is doing well at [facility]. Pt denies SI/HI and A/V hallucinations. Pt intends to continue seeing her psychiatrist after discharge and take her medications appropriately. Pt recently separated from her boyfriend of 18 years and they have no contact whatsoever. Pt became tearful when discussing their relationship. However, she states that she is grateful to be here and is optimistic about getting her life back on track.

## 2011-06-10 DIAGNOSIS — F259 Schizoaffective disorder, unspecified: Principal | ICD-10-CM

## 2011-06-10 LAB — GLUCOSE, CAPILLARY: Glucose-Capillary: 97 mg/dL (ref 70–99)

## 2011-06-10 MED ORDER — BUPROPION HCL ER (SR) 150 MG PO TB12: 150.0000 mg | ORAL_TABLET | ORAL | Status: DC

## 2011-06-10 MED ORDER — CARBAMAZEPINE 200 MG PO TABS: 200.0000 mg | ORAL_TABLET | Freq: Two times a day (BID) | ORAL | Status: DC

## 2011-06-10 MED ORDER — OMEPRAZOLE 20 MG PO CPDR: 20.0000 mg | DELAYED_RELEASE_CAPSULE | Freq: Every day | ORAL | Status: DC

## 2011-06-10 MED ORDER — METFORMIN HCL ER (OSM) 1000 MG PO TB24: 2000.0000 mg | ORAL_TABLET | Freq: Every day | ORAL | Status: DC

## 2011-06-10 NOTE — Progress Notes (Addendum)
Interdisciplinary Treatment Plan Update (Adult)  Date:  06/10/2011 Time Reviewed:  10:22 AM  Progress in Treatment: Attending groups: Yes. Participating in groups:  Yes. Taking medication as prescribed:  Yes. Tolerating medication:  Yes. Family/Significant othe contact made:  No, will contact:  sister, Johnny Bridge Patient understands diagnosis:  Yes. Discussing patient identified problems/goals with staff:  Yes. and As evidenced by:  talking about adjusting to breakup with boyfriend Medical problems stabilized or resolved:  Yes. Denies suicidal/homicidal ideation: Yes. Issues/concerns per patient self-inventory:  No. Other:  New problem(s) identified: No, Describe:  no new problems  Reason for Continuation of Hospitalization: Depression - Jamie [name] rates depression at five but feels safe to d/c home  Interventions implemented related to continuation of hospitalization:  Additional comments:  Estimated length of stay: Discharge home today  Discharge Plan:  Patient to discharge home alone and follow up with [facility] in [place] on  Tuesday, June 15, 2011.  [place] will contact her therapist by text to schedule f/u  New goal(s):  Review of initial/current patient goals per problem list:   1.  Goal(s): Eliminate SI  Met:  Yes  Target date: by discharge  As evidenced by: Juliette Alcide denying suicidality  2.  Goal (s): Reduce depression/anxiety  Met:  Yes  Target date: by discharge  As evidenced by: Anxiety reduced rating of anxiety.  Currently rates at six  3.  Goal(s): Stabilize on meds   Met:  No  Target date: by discharge  As evidenced by: Zoraida's report that medications are working without untoleratable side effects.   Attendees: Patient:  Jamie [name] 11/15/201210:22 AM  Family:   11/15/201210:22 AM  Physician:  Franchot Gallo, MD 11/15/201210:22 AM  Nursing:   Cato Mulligan, RN 11/15/201210:22 AM  Case Manager:  Juline Patch, LCSW 11/15/201210:22 AM    Counselor:  Angus Palms, LCSW 11/15/201210:22 AM  Other:  Michele Rockers, RN, Nursing Director 11/15/201210:22 AM  Other:  Estevan Ryder, LCSW-A 11/15/201210:22 AM  Other:  [name], Counseling Intern 11/15/201210:22 AM  Other:   11/15/201210:22 AM   Scribe for Treatment Team:   [name], 06/10/2011, 10:22 AM

## 2011-06-10 NOTE — Progress Notes (Signed)
[facility] Group Notes:  (Counselor/Nursing/MHT/Case Management/Adjunct)  06/10/2011 4:40 PM  Type of Therapy:  Group Therapy  Participation Level:  Active  Participation Quality:  Appropriate and Attentive  Affect:  Blunted  Cognitive:  Alert  Insight:  Limited  Engagement in Group:  Limited  Engagement in Therapy:  Limited  Modes of Intervention:  Support and Exploration  Summary of Progress/Problems: Jennipher was attentive and seemed to relate well to other group members but did not share her personal experiences.    [name] 06/10/2011, 4:40 PM

## 2011-06-10 NOTE — Progress Notes (Addendum)
  Discharge Note: Pt discharged this afternoon with all personal belongings in hand. Writer reviewed discharge instructions with pt including follow up appointments, medications and suicide precautions. Pt verbally acknowledged understanding of discharge instructions. Pt denies SI/HI and A/V hallucinations. Pt states that she has no reservations about leaving [facility] at this time.

## 2011-06-10 NOTE — Progress Notes (Signed)
[facility] Adult Inpatient Family/Significant Other Suicide Prevention Education  Suicide Prevention Education:  Education Completed; [name], sister,  (name of family member/significant other) has been identified by the patient as the family member/significant other with whom the patient will be residing, and identified as the person(s) who will aid the patient in the event of [name] mental health crisis (suicidal ideations/suicide attempt).  With written consent from the patient, the family member/significant other has been provided the following suicide prevention education, prior to the and/or following the discharge of the patient.  The suicide prevention education provided includes the following:  Suicide risk factors  Suicide prevention and interventions  National Suicide Hotline telephone number  [facility] assessment telephone number  [facility] Emergency Assistance 911  [facility] and/or Residential Mobile Crisis Unit telephone number  Request made of family/significant other to:  Remove weapons (e.g., guns, rifles, knives), all items previously/currently identified as safety concern.    Remove drugs/medications (over-the-counter, prescriptions, illicit drugs), all items previously/currently identified as [name] safety concern.  The family member/significant other verbalizes understanding of the suicide prevention education information provided.  The family member/significant other agrees to remove the items of safety concern listed above.  Sister reported she has concerns about Jamie [name]'s safety. She went on to say that Jamie [name] has not made any threats or statements that make her think Jamie [name] is suicidal, but that Jamie [name] never tells anyone until she needs hospitalizing. Sister indicated that Jamie [name] does not display any of the warning signs counselor mentioned, and that the family actually thought she was doing much better right up to the time that she was  hospitalized. She also mentioned that Jamie [name]'s ex boyfriend was married and Jamie [name] has [name] lot of anger at his wife. Counselor asked if Jamie [name] has made any statements about harming his wife, and sister said that she has not. Counselor consulted with psychiatrist, who does not believe that there is any reason to stop the discharge, as Jamie [name] adamantly denies any suicidal or homicidal thoughts. Counselor met with  Jamie [name] and discussed sister's concerns. Jamie [name] stated that she is not homicidal or suicidal, and that she has [name] plan for support if she has suicidal thoughts in the future. She reports having [name] friend she can call, as well as her family. Jamie [name] stated that she has her therapist's phone number to call 24 hours and will use it as needed.   [name] 06/10/2011, 3:27 PM

## 2011-06-10 NOTE — Progress Notes (Signed)
Suicide Risk Assessment  Discharge Assessment     Demographic factors: Assessment Details  Time of Assessment: Admission  Information Obtained From: Patient  Current Mental Status: No suicidal ideations. Loss Factors: Loss Factors: Loss of significant relationship  Historical Factors: Historical Factors: Family history of mental illness or substance abuse  Risk Reduction Factors: Risk Reduction Factors: Sense of responsibility to family;Religious beliefs about death;Positive social support;Positive therapeutic relationship   CLINICAL FACTORS:  Severe Anxiety and/or Agitation  Depression: Anhedonia  Hopelessness  Impulsivity  More than one psychiatric diagnosis  Previous Psychiatric Diagnoses and Treatments   COGNITIVE FEATURES THAT CONTRIBUTE TO RISK:  Polarized thinking   Diagnosis:  Axis I: Schizoaffective Disorder - Depressed Type  Generalized Anxiety Disorder   The patient was seen today and reports the following:   ADL's: Intact  Sleep: The patient reports to sleeping reasonably well last night.  Appetite: The patient reports a decreased appetite.   Mild>(1-10) >Severe  Hopelessness (1-10): 5  Depression (1-10): 5  Anxiety (1-10): 5   Suicidal Ideation: The patient adamantly denies any suicidal ideations today. Plan: No  Intent: No  Means: No Homicidal Ideation: The patient adamantly denies any homicidal ideations.  Plan: No  Intent: No.  Means: No  Speech: Normal  Eye Contact: Good  General Appearance /Behavior: Neat and Casual Motor Behavior: Normal Level of Consciousness: Alert  Mental Status: AO x 3  Mood: Depressed - Moderate.  Affect: Moderately Constricted  Anxiety Level: Moderate.  Thought Process: Coherent  Thought Content: WNL. No auditory or visual hallucinations or delusional thinking.  Perception: Normal  Judgment: Fair to Good.  Insight: Fair to Good  Cognition: Orientation time, place and person.  Treatment Plan Summary:  1. Daily  contact with patient to assess and evaluate symptoms and progress in treatment  2. Medication management  3. The patient will deny suicidal ideations or homicidal ideations for 48 hours prior to discharge and have a depression and anxiety rating of 3 or less. The patient will also deny any auditory or visual hallucinations or delusional thinking.   Plan:  1. The patient was continued on her current medications.  2. Will continue to monitor.  3. The patient will be discharged today at her request to follow-up with outpatient treatment.  SUICIDE RISK:   Minimal: No identifiable suicidal ideation.  Patients presenting with no risk factors but with morbid ruminations; may be classified as minimal risk based on the severity of the depressive symptoms   Randy Readling 06/10/2011, 1:33 PM

## 2011-06-10 NOTE — Progress Notes (Signed)
  Pt was in the hallway at the time of this assessment.  Pt appears sad and depressed, but reports feeling better now then when she was first admitted. Pt c/o having nausea, and was given Zofran 4 mg which did help. Pt denies any SI/HI,AVH, or pain. Support was given, Q15 checks continue, pt remains safe on the unit.

## 2011-06-10 NOTE — Progress Notes (Signed)
  Pt is awake and active on the unit today. Pt affect is sad and mood is depressed but pt states that she is ready to move past her current crisis. Pt stated that the previous relationship she lost is not worth sacrificing her wellbeing and that she is ready to put it all behind her. Pt is tearful when discussing the issue. Writer offered self and reassurance. Pt denies SI/HI and A/V hallucinations. Writer will continue to monitor.

## 2011-06-11 NOTE — Discharge Summary (Signed)
Physician Discharge Summary  Patient ID: Jamie [name] MRN: [id number] DOB/AGE: October 23, 1970 40 y.o.  Admit date: 06/08/2011 Discharge date: 06/11/2011  Admission Diagnoses: Schzoaffectve disorder, depressive type.                                          Generalized anxiety disorder  Discharge Diagnoses:  Principal Problem:  *Schizoaffective disorder, depressive type Active Problems:  Generalized anxiety disorder   Discharged Condition: fully alert, neat and casual. Denied any suicidal or homicidal thoughts. No side effects to medication  Hospital Course: Patient admitted to the adult milieu and assessed suicidality. Obtained collateral information.    Discharge Exam: Blood pressure 95/70, pulse 73, temperature 98.1 F (36.7 C), temperature source Oral, resp. rate 16, height 5' 2.5" (1.588 m), weight 104.327 kg (230 lb), last menstrual period 05/10/2011. Disposition: Home or Self Care  Discharge Orders    Future Orders Please Complete By Expires   Diet - low sodium heart healthy        Discharge Medication List as of 06/11/2011  1:31 PM    START taking these medications   Details  buPROPion (WELLBUTRIN SR) 150 MG 12 hr tablet Take 1 tablet (150 mg total) by mouth 2 (two) times daily in the am and at bedtime.., Starting 06/10/2011, Until Fri 06/09/12, Print    metFORMIN (FORTAMET) 1000 MG (OSM) 24 hr tablet Take 2 tablets (2,000 mg total) by mouth daily before breakfast., Starting 06/10/2011, Until Fri 06/09/12, Print      CONTINUE these medications which have CHANGED   Details  carbamazepine (TEGRETOL) 200 MG tablet Take 1 tablet (200 mg total) by mouth 2 (two) times daily., Starting 06/10/2011, Until Discontinued, Print    !! omeprazole (PRILOSEC) 20 MG capsule Take 1 capsule (20 mg total) by mouth daily., Starting 06/10/2011, Until Discontinued, OTC    !! omeprazole (PRILOSEC) 20 MG capsule Take 1 capsule (20 mg total) by mouth daily., Starting 06/10/2011, Until  Discontinued, OTC     !! - Potential duplicate medications found. Please discuss with provider.    CONTINUE these medications which have NOT CHANGED   Details  clonazePAM (KLONOPIN) 1 MG tablet Take 1 mg by mouth 2 (two) times daily as needed. For anxiety, Until Discontinued, Historical Med    escitalopram (LEXAPRO) 10 MG tablet Take 10 mg by mouth daily.  , Until Discontinued, Historical Med    lisinopril (PRINIVIL,ZESTRIL) 20 MG tablet Take 20 mg by mouth daily.  , Until Discontinued, Historical Med    propranolol (INDERAL) 40 MG tablet Take 40 mg by mouth 3 (three) times daily.  , Until Discontinued, Historical Med    rosuvastatin (CRESTOR) 20 MG tablet Take 20 mg by mouth daily.  , Until Discontinued, Historical Med    traZODone (DESYREL) 100 MG tablet Take 200 mg by mouth at bedtime. , Until Discontinued, Historical Med    ziprasidone (GEODON) 60 MG capsule Take 60 mg by mouth 2 (two) times daily with a meal.  , Until Discontinued, Historical Med    zolpidem (AMBIEN) 10 MG tablet Take 10 mg by mouth at bedtime as needed. For pain, Until Discontinued, Historical Med    topiramate (TOPAMAX) 25 MG capsule Take 50 mg by mouth 2 (two) times daily. , Until Discontinued, Historical Med      STOP taking these medications     buPROPion (WELLBUTRIN) 100 MG tablet  buPROPion (WELLBUTRIN) 100 MG tablet      carbamazepine (ANTIPSYCHOTIC - EQUETRO) 200 MG CP12      metFORMIN (GLUCOPHAGE) 500 MG tablet      rosiglitazone-metformin (AVANDAMET) 08-998 MG per tablet      tizanidine (ZANAFLEX) 2 MG capsule      tizanidine (ZANAFLEX) 2 MG capsule      topiramate (TOPAMAX) 25 MG tablet        Follow-up Information    Follow up with [facility] on 06/16/2011. (Your appointment with Floydene Flock is at 8:30 on Tuesday, June 15, 2011)    Contact information:   [address] [place], [place]  [address]  [phone]         Signed: Mechele Dawley 06/11/2011, 4:41 PM

## 2011-06-11 NOTE — Progress Notes (Signed)
Patient Discharge Instructions:  Dictated admission note faxed, Date faxed:  06/11/2011 D/C instructions faxed, Date faxed:  06/11/2011 Med. Rec. Form faxed, Date faxed:  06/11/2011  Wandra Scot, 06/11/2011, 3:46 PM

## 2011-08-25 DIAGNOSIS — F314 Bipolar disorder, current episode depressed, severe, without psychotic features: Secondary | ICD-10-CM | POA: Diagnosis not present

## 2011-08-30 DIAGNOSIS — R42 Dizziness and giddiness: Secondary | ICD-10-CM | POA: Diagnosis not present

## 2011-08-30 DIAGNOSIS — R55 Syncope and collapse: Secondary | ICD-10-CM | POA: Diagnosis not present

## 2011-08-30 DIAGNOSIS — R51 Headache: Secondary | ICD-10-CM | POA: Diagnosis not present

## 2011-08-30 DIAGNOSIS — R112 Nausea with vomiting, unspecified: Secondary | ICD-10-CM | POA: Diagnosis not present

## 2011-09-16 LAB — DRUG SCREEN, URINE
Barbiturates, Ur Screen: NEGATIVE (ref ?–200)
Benzodiazepine, Ur Scrn: NEGATIVE (ref ?–200)
Cannabinoid 50 Ng, Ur ~~LOC~~: NEGATIVE (ref ?–50)
MDMA (Ecstasy)Ur Screen: POSITIVE (ref ?–500)
Methadone, Ur Screen: NEGATIVE (ref ?–300)
Opiate, Ur Screen: NEGATIVE (ref ?–300)

## 2011-09-16 LAB — COMPREHENSIVE METABOLIC PANEL
Alkaline Phosphatase: 56 U/L (ref 50–136)
Anion Gap: 12 (ref 7–16)
Bilirubin,Total: 0.2 mg/dL (ref 0.2–1.0)
Calcium, Total: 9.1 mg/dL (ref 8.5–10.1)
Creatinine: 0.95 mg/dL (ref 0.60–1.30)
EGFR (Non-African Amer.): >60
Glucose: 93 mg/dL (ref 65–99)
Osmolality: 276 (ref 275–301)
Total Protein: 7.5 g/dL (ref 6.4–8.2)

## 2011-09-16 LAB — CBC
HCT: 39.3 % (ref 35.0–47.0)
MCHC: 33.6 g/dL (ref 32.0–36.0)
Platelet: 204 10*3/uL (ref 150–440)
RDW: 14.4 % (ref 11.5–14.5)
WBC: 8.5 10*3/uL (ref 3.6–11.0)

## 2011-09-16 LAB — URINALYSIS, COMPLETE
Bilirubin,UR: NEGATIVE
Ketone: NEGATIVE
Nitrite: NEGATIVE
Ph: 5 (ref 4.5–8.0)
Specific Gravity: 1.025 (ref 1.003–1.030)
Squamous Epithelial: 15
Transitional Epi: 1

## 2011-09-16 LAB — ETHANOL: Ethanol: <3 mg/dL

## 2011-09-16 LAB — TSH: Thyroid Stimulating Horm: 2.01 u[IU]/mL

## 2011-09-17 ENCOUNTER — Inpatient Hospital Stay: Payer: Self-pay | Admitting: Psychiatry

## 2011-09-17 DIAGNOSIS — Z7902 Long term (current) use of antithrombotics/antiplatelets: Secondary | ICD-10-CM | POA: Diagnosis not present

## 2011-09-17 DIAGNOSIS — R51 Headache: Secondary | ICD-10-CM | POA: Diagnosis not present

## 2011-09-17 DIAGNOSIS — F329 Major depressive disorder, single episode, unspecified: Secondary | ICD-10-CM | POA: Diagnosis not present

## 2011-09-17 DIAGNOSIS — R030 Elevated blood-pressure reading, without diagnosis of hypertension: Secondary | ICD-10-CM | POA: Diagnosis present

## 2011-09-17 DIAGNOSIS — E282 Polycystic ovarian syndrome: Secondary | ICD-10-CM | POA: Diagnosis not present

## 2011-09-17 DIAGNOSIS — R45851 Suicidal ideations: Secondary | ICD-10-CM | POA: Diagnosis not present

## 2011-09-17 DIAGNOSIS — E119 Type 2 diabetes mellitus without complications: Secondary | ICD-10-CM | POA: Diagnosis not present

## 2011-09-17 DIAGNOSIS — I10 Essential (primary) hypertension: Secondary | ICD-10-CM | POA: Diagnosis not present

## 2011-09-17 DIAGNOSIS — E785 Hyperlipidemia, unspecified: Secondary | ICD-10-CM | POA: Diagnosis not present

## 2011-09-17 DIAGNOSIS — K219 Gastro-esophageal reflux disease without esophagitis: Secondary | ICD-10-CM | POA: Diagnosis not present

## 2011-09-17 DIAGNOSIS — Z79899 Other long term (current) drug therapy: Secondary | ICD-10-CM | POA: Diagnosis not present

## 2011-09-17 DIAGNOSIS — F332 Major depressive disorder, recurrent severe without psychotic features: Secondary | ICD-10-CM | POA: Diagnosis not present

## 2011-09-17 DIAGNOSIS — Z885 Allergy status to narcotic agent status: Secondary | ICD-10-CM | POA: Diagnosis not present

## 2011-09-17 DIAGNOSIS — N390 Urinary tract infection, site not specified: Secondary | ICD-10-CM | POA: Diagnosis not present

## 2011-09-17 LAB — CARBAMAZEPINE LEVEL, TOTAL: Carbamazepine: 7.9 ug/mL (ref 4.0–12.0)

## 2011-09-19 LAB — URINE CULTURE

## 2011-09-24 DIAGNOSIS — F339 Major depressive disorder, recurrent, unspecified: Secondary | ICD-10-CM | POA: Diagnosis not present

## 2011-10-04 DIAGNOSIS — E781 Pure hyperglyceridemia: Secondary | ICD-10-CM | POA: Diagnosis not present

## 2011-10-04 DIAGNOSIS — I10 Essential (primary) hypertension: Secondary | ICD-10-CM | POA: Diagnosis not present

## 2011-10-04 DIAGNOSIS — R7309 Other abnormal glucose: Secondary | ICD-10-CM | POA: Diagnosis not present

## 2011-11-23 DIAGNOSIS — F332 Major depressive disorder, recurrent severe without psychotic features: Secondary | ICD-10-CM | POA: Diagnosis not present

## 2012-01-05 DIAGNOSIS — R7309 Other abnormal glucose: Secondary | ICD-10-CM | POA: Diagnosis not present

## 2012-01-05 DIAGNOSIS — I10 Essential (primary) hypertension: Secondary | ICD-10-CM | POA: Diagnosis not present

## 2012-01-05 DIAGNOSIS — E781 Pure hyperglyceridemia: Secondary | ICD-10-CM | POA: Diagnosis not present

## 2012-02-15 DIAGNOSIS — K5289 Other specified noninfective gastroenteritis and colitis: Secondary | ICD-10-CM | POA: Diagnosis not present

## 2012-03-09 DIAGNOSIS — F339 Major depressive disorder, recurrent, unspecified: Secondary | ICD-10-CM | POA: Diagnosis not present

## 2012-04-12 DIAGNOSIS — R45851 Suicidal ideations: Secondary | ICD-10-CM | POA: Diagnosis not present

## 2012-04-12 DIAGNOSIS — E282 Polycystic ovarian syndrome: Secondary | ICD-10-CM | POA: Diagnosis present

## 2012-04-12 DIAGNOSIS — E7800 Pure hypercholesterolemia, unspecified: Secondary | ICD-10-CM | POA: Diagnosis present

## 2012-04-12 DIAGNOSIS — I10 Essential (primary) hypertension: Secondary | ICD-10-CM | POA: Diagnosis present

## 2012-04-12 DIAGNOSIS — F333 Major depressive disorder, recurrent, severe with psychotic symptoms: Secondary | ICD-10-CM | POA: Diagnosis not present

## 2012-04-26 DIAGNOSIS — K219 Gastro-esophageal reflux disease without esophagitis: Secondary | ICD-10-CM | POA: Diagnosis not present

## 2012-04-26 DIAGNOSIS — E782 Mixed hyperlipidemia: Secondary | ICD-10-CM | POA: Diagnosis not present

## 2012-04-26 DIAGNOSIS — I10 Essential (primary) hypertension: Secondary | ICD-10-CM | POA: Diagnosis not present

## 2012-04-26 DIAGNOSIS — R7309 Other abnormal glucose: Secondary | ICD-10-CM | POA: Diagnosis not present

## 2012-04-26 DIAGNOSIS — E781 Pure hyperglyceridemia: Secondary | ICD-10-CM | POA: Diagnosis not present

## 2012-05-03 ENCOUNTER — Encounter (HOSPITAL_COMMUNITY): Payer: Self-pay | Admitting: Psychiatry

## 2012-05-03 ENCOUNTER — Ambulatory Visit (INDEPENDENT_AMBULATORY_CARE_PROVIDER_SITE_OTHER): Payer: Medicare Other | Admitting: Psychiatry

## 2012-05-03 VITALS — BP 80/51 | HR 84 | Ht 63.0 in | Wt 211.6 lb

## 2012-05-03 DIAGNOSIS — F333 Major depressive disorder, recurrent, severe with psychotic symptoms: Secondary | ICD-10-CM

## 2012-05-03 DIAGNOSIS — F323 Major depressive disorder, single episode, severe with psychotic features: Secondary | ICD-10-CM

## 2012-05-03 MED ORDER — ZIPRASIDONE HCL 60 MG PO CAPS: 40.0000 mg | ORAL_CAPSULE | Freq: Two times a day (BID) | ORAL | Status: DC

## 2012-05-03 MED ORDER — DULOXETINE HCL 60 MG PO CPEP: 120.0000 mg | ORAL_CAPSULE | Freq: Every day | ORAL | Status: DC

## 2012-05-03 MED ORDER — ZIPRASIDONE HCL 40 MG PO CAPS: 40.0000 mg | ORAL_CAPSULE | Freq: Two times a day (BID) | ORAL | Status: DC

## 2012-05-03 NOTE — Progress Notes (Signed)
Psychiatric Assessment Adult  Patient Identification:  Jamie [name] Date of Evaluation:  05/03/2012 Chief Complaint: begin care History of Chief Complaint:  No chief complaint on file. this patient is a 41 year old white single female who lives alone in [place]. This patient is diagnosed with a chronic mental illness. Actually she is on disability for back problems as well as psychological issues. The patient has had 5 psychiatric hospitalizations in the last one was 10 days ago in [facility] in [place]. She was hospitalized for 10 days. Previous to that she was hospitalized at [facility]. After being hospitalized at the [facility] she then went to see her outpatient psychiatrist Dr. Levi Aland who apparently she had a conflict with. She became suicidal started to hallucinate and depressed. She was referred and cared for at [facility]. She's been out of the hospital now 10 days. She's actually doing quite well now. She denies auditory visual hallucinations at this time. She denies daily depression. She is sleeping well on multiple medications. Her energy level is good, she can think and concentrate without problems and denies feeling worthless. She enjoys reading, coping with TV a dog 2 cats. She denies being suicidal now and has never made an actual attempt to end her life. The patient denies the use of alcohol or drugs. She denies ever being paranoid. She denies ever having visual hallucinations and is noted her auditory hallucinations have abated. She does describe past episode of major depression. The last one was 2 months ago associated with multiple vegetative symptoms. In a very close evaluation the patient denies symptoms consistent with mania. She denies symptoms of generalized anxiety disorder, panic disorder or OCD. The patient says the biggest trauma she's had was a year ago when she broke up with a boyfriend was unfaithful to her. The patient is in regular psychotherapy  in her community. Presently she's not in a relationship but she has 5 or 6 very) were very supportive. At this time she feels at her baseline. He only possible issue is the fact that she lost 20 pounds in 2 months for reasons that she does not know. Also noted that when she heard voices he was one voice that her boyfriend telling her to hurt herself. At this time she feels that she's back to her baseline and wishing to start a new relationship with another doctor. She's been on multiple psychotropic medications. She's never been on Abilify Remeron nor Effexor. When she was hospitalized the adjustments were that he increase her Geodon from 60 mg to 80 mg and that they increased her Cymbalta from 60 mg to 120 mg.  HPI Review of Systems Physical Exam  Depressive Symptoms: depressed mood,  (Hypo) Manic Symptoms:   Elevated Mood:  No Irritable Mood:  No Grandiosity:  No Distractibility:  No Labiality of Mood:  No Delusions:  No Hallucinations:  No Impulsivity:  No Sexually Inappropriate Behavior:  No Financial Extravagance:  No Flight of Ideas:  No  Anxiety Symptoms: Excessive Worry:  No Panic Symptoms:  No Agoraphobia:  No Obsessive Compulsive: No  Symptoms: None, Specific Phobias:  No Social Anxiety:  No  Psychotic Symptoms:  Hallucinations: No  Delusions:  No Paranoia:  No   Ideas of Reference:  No  PTSD Symptoms: Ever had a traumatic exposure:  No Had a traumatic exposure in the last month:  No Re-experiencing: No None Hypervigilance:  No Hyperarousal: No None Avoidance: No None  Traumatic Brain Injury: No   Past  Psychiatric History:5 x Major deprtession with psychosis  Hospitalizations: 5x  Outpatient Care: Dr Geanie Cooley @ [facility]  Substance Abuse Care:   Self-Mutilation:   Suicidal Attempts:   Violent Behaviors:    Past Medical History:   Past Medical History  Diagnosis Date  . Hypertension   . High cholesterol   . Bipolar 1 disorder   . Depression   . Anxiety     History of Loss of Consciousness:  No Seizure History:  No Cardiac History:  No Allergies:   Allergies  Allergen Reactions  . Codeine Hives and Swelling   Current Medications:  Current Outpatient Prescriptions  Medication Sig Dispense Refill  . buPROPion (WELLBUTRIN SR) 150 MG 12 hr tablet Take 1 tablet (150 mg total) by mouth 2 (two) times daily in the am and at bedtime..  60 tablet  0  . carbamazepine (TEGRETOL) 200 MG tablet Take 1 tablet (200 mg total) by mouth 2 (two) times daily.  60 tablet  0  . clonazePAM (KLONOPIN) 1 MG tablet Take 1 mg by mouth 2 (two) times daily as needed. For anxiety      . escitalopram (LEXAPRO) 10 MG tablet Take 10 mg by mouth daily.        [name] Kitchen lisinopril (PRINIVIL,ZESTRIL) 20 MG tablet Take 20 mg by mouth daily.        . metFORMIN (FORTAMET) 1000 MG (OSM) 24 hr tablet Take 2 tablets (2,000 mg total) by mouth daily before breakfast.  60 tablet  0  . omeprazole (PRILOSEC) 20 MG capsule Take 1 capsule (20 mg total) by mouth daily.  30 capsule  0  . omeprazole (PRILOSEC) 20 MG capsule Take 1 capsule (20 mg total) by mouth daily.  30 capsule  0  . propranolol (INDERAL) 40 MG tablet Take 40 mg by mouth 3 (three) times daily.        . rosuvastatin (CRESTOR) 20 MG tablet Take 20 mg by mouth daily.        [name] Kitchen topiramate (TOPAMAX) 25 MG capsule Take 50 mg by mouth 2 (two) times daily.       . traZODone (DESYREL) 100 MG tablet Take 200 mg by mouth at bedtime.       . ziprasidone (GEODON) 60 MG capsule Take 60 mg by mouth 2 (two) times daily with a meal.        . zolpidem (AMBIEN) 10 MG tablet Take 10 mg by mouth at bedtime as needed. For pain        Previous Psychotropic Medications:  Medication Dose                          Substance Abuse History in the last 12 months:   Medical Consequences of Substance Abuse:   Legal Consequences of Substance Abuse:   Family Consequences of Substance Abuse:   Blackouts:   DT's:   Withdrawal Symptoms:    None  Social History: Current Place of Residence: [place] of Birth:  Family Members:  Marital Status:  Single Children:   Sons:   Daughters:  Relationships:  Education:  [employer] Problems/Performance:  Religious Beliefs/Practices:  History of Abuse:  Teacher, music History:   Legal History:  Hobbies/Interests:   Family History:  No family history on file.  Mental Status Examination/Evaluation: Objective:  Appearance: Casual  Eye Contact::  Good  Speech:  Clear and Coherent  Volume:  Normal  Mood:  flat  Affect:  Flat  Thought Process:  Coherent  Orientation:  Full  Thought Content:  WDL  Suicidal Thoughts:  No  Homicidal Thoughts:  No  Judgement:  Good  Insight:  Good  Psychomotor Activity:  Decreased  Akathisia:  No  Handed:  Right  AIMS (if indicated):    Assets:      Laboratory/X-Ray Psychological Evaluation(s)        Assessment:  Axis I: Major Depression, Recurrent severe  AXIS I Major Depression, Recurrent severe  AXIS II No diagnosis  AXIS III Past Medical History  Diagnosis Date  . Hypertension   . High cholesterol   . Bipolar 1 disorder   . Depression   . Anxiety      AXIS IV problems related to social environment  AXIS V 61-70 mild symptoms   Treatment Plan/Recommendations:  Plan of Care: at this time the patient will continue all her medications. This includes Wellbutrin 150 mg slow release twice a day, Tegretol 200 mg twice a day, Klonopin 1 mg 3 times a day, Cymbalta 60 mg 2 at night, trazodone 100 mg at night, Geodon 80 mg at suppertime and Ambien 10 mg at night. On her next visit we'll obtain blood work including a Tegretol level. At this time she'll continue in her talking therapy and return to see me in 6 weeks.  Laboratory:    Psychotherapy:   Medications: continue Wellbutrin 150 mg slow-release twice a day, Tegretol 200 mg twice a day, Tegretol 1 mg 3 times a day, Cymbalta 60 mg 2 in the  morning, trazodone 100 mg at night, Ambien 10 mg at night and Geodon 80 mg at dinner.  Routine PRN Medications:    Consultations:   Safety Concerns:    Other:      Madicyn Mesina, [place], MD 10/9/20132:43 PM

## 2012-05-07 DIAGNOSIS — E860 Dehydration: Secondary | ICD-10-CM | POA: Diagnosis not present

## 2012-05-07 DIAGNOSIS — R112 Nausea with vomiting, unspecified: Secondary | ICD-10-CM | POA: Diagnosis not present

## 2012-05-08 DIAGNOSIS — K612 Anorectal abscess: Secondary | ICD-10-CM | POA: Diagnosis not present

## 2012-05-08 DIAGNOSIS — A419 Sepsis, unspecified organism: Secondary | ICD-10-CM | POA: Diagnosis not present

## 2012-05-08 DIAGNOSIS — E860 Dehydration: Secondary | ICD-10-CM | POA: Diagnosis present

## 2012-05-08 DIAGNOSIS — M549 Dorsalgia, unspecified: Secondary | ICD-10-CM | POA: Diagnosis present

## 2012-05-08 DIAGNOSIS — Z6839 Body mass index (BMI) 39.0-39.9, adult: Secondary | ICD-10-CM | POA: Diagnosis not present

## 2012-05-08 DIAGNOSIS — E282 Polycystic ovarian syndrome: Secondary | ICD-10-CM | POA: Diagnosis not present

## 2012-05-08 DIAGNOSIS — I10 Essential (primary) hypertension: Secondary | ICD-10-CM | POA: Diagnosis present

## 2012-05-08 DIAGNOSIS — G8929 Other chronic pain: Secondary | ICD-10-CM | POA: Diagnosis present

## 2012-05-08 DIAGNOSIS — Z885 Allergy status to narcotic agent status: Secondary | ICD-10-CM | POA: Diagnosis not present

## 2012-05-08 DIAGNOSIS — F411 Generalized anxiety disorder: Secondary | ICD-10-CM | POA: Diagnosis not present

## 2012-05-08 DIAGNOSIS — R55 Syncope and collapse: Secondary | ICD-10-CM | POA: Diagnosis not present

## 2012-05-08 DIAGNOSIS — F319 Bipolar disorder, unspecified: Secondary | ICD-10-CM | POA: Diagnosis not present

## 2012-05-08 DIAGNOSIS — R3919 Other difficulties with micturition: Secondary | ICD-10-CM | POA: Diagnosis not present

## 2012-05-08 DIAGNOSIS — Z79899 Other long term (current) drug therapy: Secondary | ICD-10-CM | POA: Diagnosis not present

## 2012-05-08 DIAGNOSIS — R112 Nausea with vomiting, unspecified: Secondary | ICD-10-CM | POA: Diagnosis not present

## 2012-05-08 DIAGNOSIS — E7800 Pure hypercholesterolemia, unspecified: Secondary | ICD-10-CM | POA: Diagnosis present

## 2012-05-08 DIAGNOSIS — Z2821 Immunization not carried out because of patient refusal: Secondary | ICD-10-CM | POA: Diagnosis not present

## 2012-05-08 DIAGNOSIS — E785 Hyperlipidemia, unspecified: Secondary | ICD-10-CM | POA: Diagnosis present

## 2012-05-08 DIAGNOSIS — E669 Obesity, unspecified: Secondary | ICD-10-CM | POA: Diagnosis present

## 2012-05-13 DIAGNOSIS — E119 Type 2 diabetes mellitus without complications: Secondary | ICD-10-CM | POA: Diagnosis not present

## 2012-05-13 DIAGNOSIS — I10 Essential (primary) hypertension: Secondary | ICD-10-CM | POA: Diagnosis not present

## 2012-05-13 DIAGNOSIS — F329 Major depressive disorder, single episode, unspecified: Secondary | ICD-10-CM | POA: Diagnosis not present

## 2012-05-13 DIAGNOSIS — K612 Anorectal abscess: Secondary | ICD-10-CM | POA: Diagnosis not present

## 2012-05-13 DIAGNOSIS — F319 Bipolar disorder, unspecified: Secondary | ICD-10-CM | POA: Diagnosis not present

## 2012-05-14 DIAGNOSIS — F319 Bipolar disorder, unspecified: Secondary | ICD-10-CM | POA: Diagnosis not present

## 2012-05-14 DIAGNOSIS — F329 Major depressive disorder, single episode, unspecified: Secondary | ICD-10-CM | POA: Diagnosis not present

## 2012-05-14 DIAGNOSIS — K612 Anorectal abscess: Secondary | ICD-10-CM | POA: Diagnosis not present

## 2012-05-14 DIAGNOSIS — E119 Type 2 diabetes mellitus without complications: Secondary | ICD-10-CM | POA: Diagnosis not present

## 2012-05-14 DIAGNOSIS — I10 Essential (primary) hypertension: Secondary | ICD-10-CM | POA: Diagnosis not present

## 2012-05-15 DIAGNOSIS — I10 Essential (primary) hypertension: Secondary | ICD-10-CM | POA: Diagnosis not present

## 2012-05-15 DIAGNOSIS — K612 Anorectal abscess: Secondary | ICD-10-CM | POA: Diagnosis not present

## 2012-05-15 DIAGNOSIS — F319 Bipolar disorder, unspecified: Secondary | ICD-10-CM | POA: Diagnosis not present

## 2012-05-15 DIAGNOSIS — F329 Major depressive disorder, single episode, unspecified: Secondary | ICD-10-CM | POA: Diagnosis not present

## 2012-05-15 DIAGNOSIS — E119 Type 2 diabetes mellitus without complications: Secondary | ICD-10-CM | POA: Diagnosis not present

## 2012-05-18 DIAGNOSIS — I10 Essential (primary) hypertension: Secondary | ICD-10-CM | POA: Diagnosis not present

## 2012-05-18 DIAGNOSIS — E119 Type 2 diabetes mellitus without complications: Secondary | ICD-10-CM | POA: Diagnosis not present

## 2012-05-18 DIAGNOSIS — F319 Bipolar disorder, unspecified: Secondary | ICD-10-CM | POA: Diagnosis not present

## 2012-05-18 DIAGNOSIS — K612 Anorectal abscess: Secondary | ICD-10-CM | POA: Diagnosis not present

## 2012-05-18 DIAGNOSIS — F329 Major depressive disorder, single episode, unspecified: Secondary | ICD-10-CM | POA: Diagnosis not present

## 2012-05-22 DIAGNOSIS — F319 Bipolar disorder, unspecified: Secondary | ICD-10-CM | POA: Diagnosis not present

## 2012-05-22 DIAGNOSIS — E119 Type 2 diabetes mellitus without complications: Secondary | ICD-10-CM | POA: Diagnosis not present

## 2012-05-22 DIAGNOSIS — F329 Major depressive disorder, single episode, unspecified: Secondary | ICD-10-CM | POA: Diagnosis not present

## 2012-05-22 DIAGNOSIS — I10 Essential (primary) hypertension: Secondary | ICD-10-CM | POA: Diagnosis not present

## 2012-05-22 DIAGNOSIS — K612 Anorectal abscess: Secondary | ICD-10-CM | POA: Diagnosis not present

## 2012-05-25 DIAGNOSIS — F329 Major depressive disorder, single episode, unspecified: Secondary | ICD-10-CM | POA: Diagnosis not present

## 2012-05-25 DIAGNOSIS — I10 Essential (primary) hypertension: Secondary | ICD-10-CM | POA: Diagnosis not present

## 2012-05-25 DIAGNOSIS — K612 Anorectal abscess: Secondary | ICD-10-CM | POA: Diagnosis not present

## 2012-05-25 DIAGNOSIS — E119 Type 2 diabetes mellitus without complications: Secondary | ICD-10-CM | POA: Diagnosis not present

## 2012-05-25 DIAGNOSIS — F319 Bipolar disorder, unspecified: Secondary | ICD-10-CM | POA: Diagnosis not present

## 2012-05-29 DIAGNOSIS — F329 Major depressive disorder, single episode, unspecified: Secondary | ICD-10-CM | POA: Diagnosis not present

## 2012-05-29 DIAGNOSIS — E119 Type 2 diabetes mellitus without complications: Secondary | ICD-10-CM | POA: Diagnosis not present

## 2012-05-29 DIAGNOSIS — K612 Anorectal abscess: Secondary | ICD-10-CM | POA: Diagnosis not present

## 2012-05-29 DIAGNOSIS — F319 Bipolar disorder, unspecified: Secondary | ICD-10-CM | POA: Diagnosis not present

## 2012-05-29 DIAGNOSIS — I10 Essential (primary) hypertension: Secondary | ICD-10-CM | POA: Diagnosis not present

## 2012-05-31 DIAGNOSIS — K612 Anorectal abscess: Secondary | ICD-10-CM | POA: Diagnosis not present

## 2012-05-31 DIAGNOSIS — E119 Type 2 diabetes mellitus without complications: Secondary | ICD-10-CM | POA: Diagnosis not present

## 2012-05-31 DIAGNOSIS — F329 Major depressive disorder, single episode, unspecified: Secondary | ICD-10-CM | POA: Diagnosis not present

## 2012-05-31 DIAGNOSIS — F319 Bipolar disorder, unspecified: Secondary | ICD-10-CM | POA: Diagnosis not present

## 2012-05-31 DIAGNOSIS — I10 Essential (primary) hypertension: Secondary | ICD-10-CM | POA: Diagnosis not present

## 2012-06-07 DIAGNOSIS — F319 Bipolar disorder, unspecified: Secondary | ICD-10-CM | POA: Diagnosis not present

## 2012-06-07 DIAGNOSIS — E119 Type 2 diabetes mellitus without complications: Secondary | ICD-10-CM | POA: Diagnosis not present

## 2012-06-07 DIAGNOSIS — I10 Essential (primary) hypertension: Secondary | ICD-10-CM | POA: Diagnosis not present

## 2012-06-07 DIAGNOSIS — K612 Anorectal abscess: Secondary | ICD-10-CM | POA: Diagnosis not present

## 2012-06-07 DIAGNOSIS — F329 Major depressive disorder, single episode, unspecified: Secondary | ICD-10-CM | POA: Diagnosis not present

## 2012-06-13 DIAGNOSIS — E119 Type 2 diabetes mellitus without complications: Secondary | ICD-10-CM | POA: Diagnosis not present

## 2012-06-13 DIAGNOSIS — I10 Essential (primary) hypertension: Secondary | ICD-10-CM | POA: Diagnosis not present

## 2012-06-13 DIAGNOSIS — F329 Major depressive disorder, single episode, unspecified: Secondary | ICD-10-CM | POA: Diagnosis not present

## 2012-06-13 DIAGNOSIS — F319 Bipolar disorder, unspecified: Secondary | ICD-10-CM | POA: Diagnosis not present

## 2012-06-13 DIAGNOSIS — K612 Anorectal abscess: Secondary | ICD-10-CM | POA: Diagnosis not present

## 2012-06-14 ENCOUNTER — Ambulatory Visit (INDEPENDENT_AMBULATORY_CARE_PROVIDER_SITE_OTHER): Payer: Medicare Other | Admitting: Psychiatry

## 2012-06-14 DIAGNOSIS — F3130 Bipolar disorder, current episode depressed, mild or moderate severity, unspecified: Secondary | ICD-10-CM

## 2012-06-14 NOTE — Progress Notes (Signed)
[facility] MD Progress Note  06/14/2012 4:22 PM Jamie [name]  MRN:  [id number]  Diagnosis:  Axis I: Bipolar, Depressed Patient came today 20 minutes late for a 30 minute session. The patient recently got out of the hospital for an infection and had a cyst. Is in the hospital for approximately one week. She had dehydration. Now she is back home and doing better. She now denies daily depression. She is sleeping and eating well. She is no evidence of psychosis. She is functioning fairly well. This patient will get a set of blood work done at a local hospital and received a prescription for that blood work. ADL's:  Intact  Sleep: Good  Appetite:  Good  Suicidal Ideation:  no Homicidal Ideation:  no  AEB (as evidenced by):  Mental Status Examination/Evaluation: Objective:  Appearance: Disheveled  Eye Contact::  Good  Speech:  Clear and Coherent  Volume:  Normal  Mood:  Euphoric  Affect:  Congruent  Thought Process:  Coherent  Orientation:  Full  Thought Content:  WDL  Suicidal Thoughts:  No  Homicidal Thoughts:  No  Memory:  nkl  Judgement:  Good  Insight:  Good  Psychomotor Activity:  Normal  Concentration:  Good  Recall:  Good  Akathisia:  No  Handed:  Right  AIMS (if indicated):     Assets:  Communication Skills  Sleep:      Vital Signs:There were no vitals taken for this visit. Current Medications: Current Outpatient Prescriptions  Medication Sig Dispense Refill  . buPROPion (WELLBUTRIN SR) 150 MG 12 hr tablet Take 1 tablet (150 mg total) by mouth 2 (two) times daily in the am and at bedtime..  60 tablet  0  . carbamazepine (TEGRETOL) 200 MG tablet Take 1 tablet (200 mg total) by mouth 2 (two) times daily.  60 tablet  0  . clonazePAM (KLONOPIN) 1 MG tablet Take 1 mg by mouth 2 (two) times daily as needed. For anxiety      . DULoxetine (CYMBALTA) 60 MG capsule Take 2 capsules (120 mg total) by mouth daily.  60 capsule  3  . lisinopril (PRINIVIL,ZESTRIL) 20 MG tablet Take  20 mg by mouth daily.        . metFORMIN (FORTAMET) 1000 MG (OSM) 24 hr tablet Take 2 tablets (2,000 mg total) by mouth daily before breakfast.  60 tablet  0  . omeprazole (PRILOSEC) 20 MG capsule Take 1 capsule (20 mg total) by mouth daily.  30 capsule  0  . omeprazole (PRILOSEC) 20 MG capsule Take 1 capsule (20 mg total) by mouth daily.  30 capsule  0  . propranolol (INDERAL) 40 MG tablet Take 40 mg by mouth 3 (three) times daily.        . rosuvastatin (CRESTOR) 20 MG tablet Take 20 mg by mouth daily.        [name] Kitchen topiramate (TOPAMAX) 25 MG capsule Take 50 mg by mouth 2 (two) times daily.       . traZODone (DESYREL) 100 MG tablet Take 200 mg by mouth at bedtime.       . ziprasidone (GEODON) 40 MG capsule Take 1 capsule (40 mg total) by mouth 2 (two) times daily with a meal.  60 capsule  3  . ziprasidone (GEODON) 60 MG capsule Take 1 capsule (60 mg total) by mouth 2 (two) times daily with a meal.  2 capsule  3  . zolpidem (AMBIEN) 10 MG tablet Take 10 mg by mouth at bedtime  as needed. For pain        Lab Results: No results found for this or any previous visit (from the past 48 hour(s)).  Physical Findings: AIMS:  , ,  ,  ,    CIWA:    COWS:     Treatment Plan Summary: At this time the patient will continue all her medications and will go to her local hospital to get a Tegretol blood level, conference of metabolic panel, a CBC and a TSH. The patient return to see me in 2 months. At this time the patient is actually doing quite well she is engaging and friendly and denies any distress at this time. Plan:  Lucas Mallow 06/14/2012, 4:22 PM

## 2012-06-19 DIAGNOSIS — I10 Essential (primary) hypertension: Secondary | ICD-10-CM | POA: Diagnosis not present

## 2012-06-19 DIAGNOSIS — K612 Anorectal abscess: Secondary | ICD-10-CM | POA: Diagnosis not present

## 2012-06-19 DIAGNOSIS — F319 Bipolar disorder, unspecified: Secondary | ICD-10-CM | POA: Diagnosis not present

## 2012-06-19 DIAGNOSIS — F329 Major depressive disorder, single episode, unspecified: Secondary | ICD-10-CM | POA: Diagnosis not present

## 2012-06-19 DIAGNOSIS — E119 Type 2 diabetes mellitus without complications: Secondary | ICD-10-CM | POA: Diagnosis not present

## 2012-07-05 DIAGNOSIS — F329 Major depressive disorder, single episode, unspecified: Secondary | ICD-10-CM | POA: Diagnosis not present

## 2012-07-05 DIAGNOSIS — F319 Bipolar disorder, unspecified: Secondary | ICD-10-CM | POA: Diagnosis not present

## 2012-07-05 DIAGNOSIS — E119 Type 2 diabetes mellitus without complications: Secondary | ICD-10-CM | POA: Diagnosis not present

## 2012-07-05 DIAGNOSIS — K612 Anorectal abscess: Secondary | ICD-10-CM | POA: Diagnosis not present

## 2012-07-05 DIAGNOSIS — I10 Essential (primary) hypertension: Secondary | ICD-10-CM | POA: Diagnosis not present

## 2012-08-09 DIAGNOSIS — F333 Major depressive disorder, recurrent, severe with psychotic symptoms: Secondary | ICD-10-CM | POA: Diagnosis not present

## 2012-08-09 DIAGNOSIS — F259 Schizoaffective disorder, unspecified: Secondary | ICD-10-CM | POA: Diagnosis not present

## 2012-08-09 DIAGNOSIS — Z79899 Other long term (current) drug therapy: Secondary | ICD-10-CM | POA: Diagnosis not present

## 2012-08-16 ENCOUNTER — Ambulatory Visit (INDEPENDENT_AMBULATORY_CARE_PROVIDER_SITE_OTHER): Payer: Medicare Other | Admitting: Psychiatry

## 2012-08-16 DIAGNOSIS — F3130 Bipolar disorder, current episode depressed, mild or moderate severity, unspecified: Secondary | ICD-10-CM | POA: Diagnosis not present

## 2012-08-16 DIAGNOSIS — F3131 Bipolar disorder, current episode depressed, mild: Secondary | ICD-10-CM

## 2012-08-16 MED ORDER — BUPROPION HCL ER (XL) 300 MG PO TB24: 300.0000 mg | ORAL_TABLET | Freq: Every day | ORAL | Status: DC

## 2012-08-16 MED ORDER — ZIPRASIDONE HCL 40 MG PO CAPS: 40.0000 mg | ORAL_CAPSULE | Freq: Two times a day (BID) | ORAL | Status: DC

## 2012-08-16 MED ORDER — CLONAZEPAM 1 MG PO TABS: 3.0000 mg | ORAL_TABLET | Freq: Two times a day (BID) | ORAL | Status: DC | PRN

## 2012-08-16 MED ORDER — DULOXETINE HCL 60 MG PO CPEP: 120.0000 mg | ORAL_CAPSULE | Freq: Every day | ORAL | Status: DC

## 2012-08-16 MED ORDER — TRAZODONE HCL 100 MG PO TABS: 200.0000 mg | ORAL_TABLET | Freq: Every day | ORAL | Status: DC

## 2012-08-16 MED ORDER — CARBAMAZEPINE 200 MG PO TABS: 200.0000 mg | ORAL_TABLET | Freq: Two times a day (BID) | ORAL | Status: DC

## 2012-08-16 MED ORDER — ZOLPIDEM TARTRATE 10 MG PO TABS: 10.0000 mg | ORAL_TABLET | Freq: Every evening | ORAL | Status: DC | PRN

## 2012-08-16 MED ORDER — ZIPRASIDONE HCL 80 MG PO CAPS: 160.0000 mg | ORAL_CAPSULE | Freq: Two times a day (BID) | ORAL | Status: DC

## 2012-08-16 NOTE — Progress Notes (Signed)
[facility] MD Progress Note  08/16/2012 2:34 PM LUNDEN STIEBER  MRN:  [id number] Subjective:  Feels well Diagnosis:  Axis I: Bipolar, Depressed Today the patient was seen one time. She says she feels very well. Physical emotional she is doing great. The patient is somewhat of a loner. She does have a couple of girlfriends presently in no relationship in a romantic way. The patient denies being depressed. She says she is sleeping and eating well. She enjoys quilting and reading and watching TV. She has good energy. She denies being suicidal. She takes all her medicines as prescribed. As patient is on disability for emotional problems and back issues. Patient had somewhat of a rough holidays but that seems to past. ADL's:  Intact  Sleep: Good  Appetite:  Good  Suicidal Ideation:  none Homicidal Ideation:  no AEB (as evidenced by):  Psychiatric Specialty Exam: ROS  There were no vitals taken for this visit.There is no height or weight on file to calculate BMI.  General Appearance: Fairly Groomed  Patent attorney::  Good  Speech:  NA  Volume:  Normal  Mood:  Euthymic  Affect:  Appropriate  Thought Process:  Coherent  Orientation:  Full (Time, Place, and Person)  Thought Content:  WDL  Suicidal Thoughts:  No  Homicidal Thoughts:  No  Memory:  nl  Judgement:  Good  Insight:  Good  Psychomotor Activity:  Normal  Concentration:  Good  Recall:  Good  Akathisia:  No  Handed:  Right  AIMS (if indicated):     Assets:  Desire for Improvement  Sleep:      Current Medications: Current Outpatient Prescriptions  Medication Sig Dispense Refill  . buPROPion (WELLBUTRIN SR) 150 MG 12 hr tablet Take 1 tablet (150 mg total) by mouth 2 (two) times daily in the am and at bedtime..  60 tablet  0  . buPROPion (WELLBUTRIN XL) 300 MG 24 hr tablet Take 1 tablet (300 mg total) by mouth daily.  30 tablet  5  . carbamazepine (TEGRETOL) 200 MG tablet Take 1 tablet (200 mg total) by mouth 2 (two) times daily.   60 tablet  5  . clonazePAM (KLONOPIN) 1 MG tablet Take 3 tablets (3 mg total) by mouth 2 (two) times daily as needed. For anxiety  90 tablet  5  . DULoxetine (CYMBALTA) 60 MG capsule Take 2 capsules (120 mg total) by mouth daily.  60 capsule  5  . lisinopril (PRINIVIL,ZESTRIL) 20 MG tablet Take 20 mg by mouth daily.        . metFORMIN (FORTAMET) 1000 MG (OSM) 24 hr tablet Take 2 tablets (2,000 mg total) by mouth daily before breakfast.  60 tablet  0  . omeprazole (PRILOSEC) 20 MG capsule Take 1 capsule (20 mg total) by mouth daily.  30 capsule  0  . omeprazole (PRILOSEC) 20 MG capsule Take 1 capsule (20 mg total) by mouth daily.  30 capsule  0  . propranolol (INDERAL) 40 MG tablet Take 40 mg by mouth 3 (three) times daily.        . rosuvastatin (CRESTOR) 20 MG tablet Take 20 mg by mouth daily.        [name] Kitchen topiramate (TOPAMAX) 25 MG capsule Take 50 mg by mouth 2 (two) times daily.       . traZODone (DESYREL) 100 MG tablet Take 2 tablets (200 mg total) by mouth at bedtime.  30 tablet  5  . ziprasidone (GEODON) 40 MG capsule Take 1  capsule (40 mg total) by mouth 2 (two) times daily with a meal.  60 capsule  3  . ziprasidone (GEODON) 80 MG capsule Take 2 capsules (160 mg total) by mouth 2 (two) times daily with a meal.  60 capsule  5  . zolpidem (AMBIEN) 10 MG tablet Take 1 tablet (10 mg total) by mouth at bedtime as needed. For pain  30 tablet  5    Lab Results: No results found for this or any previous visit (from the past 48 hour(s)).  Physical Findings: AIMS:  , ,  ,  ,    CIWA:    COWS:     Treatment Plan Summary:   Plan:at this time we'll continue all her medications.the patient will continue taking Tegretol 200 mg twice a day, Geodon 80 mg twice a day, Cymbalta 60 mg 2 in the morning, Wellbutrin 300 mg XL in the morning, Klonopin 1 mg 3 times a day, trazodone 100 mg at night. Recently the patient had all her blood work done we will check on that. The patient is doing quite well and return  to see me in 3 months.  Medical Decision Making Problem Points:  Established problem, worsening (2) Data Points:  Review of new medications or change in dosage (2)  I certify that inpatient services furnished can reasonably be expected to improve the patient's condition.   Lucas Mallow 08/16/2012, 2:34 PM

## 2012-08-22 DIAGNOSIS — Z01419 Encounter for gynecological examination (general) (routine) without abnormal findings: Secondary | ICD-10-CM | POA: Diagnosis not present

## 2012-09-04 DIAGNOSIS — Z1231 Encounter for screening mammogram for malignant neoplasm of breast: Secondary | ICD-10-CM | POA: Diagnosis not present

## 2012-09-16 DIAGNOSIS — Z79899 Other long term (current) drug therapy: Secondary | ICD-10-CM | POA: Diagnosis not present

## 2012-09-16 DIAGNOSIS — R112 Nausea with vomiting, unspecified: Secondary | ICD-10-CM | POA: Diagnosis not present

## 2012-09-16 DIAGNOSIS — I10 Essential (primary) hypertension: Secondary | ICD-10-CM | POA: Diagnosis not present

## 2012-09-17 DIAGNOSIS — I700 Atherosclerosis of aorta: Secondary | ICD-10-CM | POA: Diagnosis not present

## 2012-11-15 ENCOUNTER — Ambulatory Visit (INDEPENDENT_AMBULATORY_CARE_PROVIDER_SITE_OTHER): Payer: Medicare Other | Admitting: Psychiatry

## 2012-11-15 VITALS — BP 106/71 | HR 98 | Ht 62.0 in

## 2012-11-15 DIAGNOSIS — F329 Major depressive disorder, single episode, unspecified: Secondary | ICD-10-CM

## 2012-11-15 DIAGNOSIS — F3130 Bipolar disorder, current episode depressed, mild or moderate severity, unspecified: Secondary | ICD-10-CM

## 2012-11-15 DIAGNOSIS — F315 Bipolar disorder, current episode depressed, severe, with psychotic features: Secondary | ICD-10-CM | POA: Insufficient documentation

## 2012-11-15 DIAGNOSIS — F319 Bipolar disorder, unspecified: Secondary | ICD-10-CM | POA: Diagnosis not present

## 2012-11-15 NOTE — Progress Notes (Signed)
[facility] MD Progress Note  11/15/2012 3:48 PM Jamie [name]  MRN:  [id number] Subjective: well Diagnosis:  Axis I: Bipolar, Depressed Today the patient was seen alone. This patient is actually doing quite well. She does have one significant stressor in that she has a girlfriend who at this time has cancer and is getting chemotherapy. Other than this her social life is stable. The patient is somewhat sedentary in life. Her good news is she is getting another dog. This will make 2 dogs and 2 cats. The patient healthwise is doing great. She's lost 15 pounds. Her blood pressure medications have been reduced because her blood pressure is so well controlled. The patient feels that she's got energy and focus. It is noted that her last psychiatric hospitalization was in 2013 when in that year she had 5 psychiatric hospitalizations. At her last hospitalization they came up with the present regime that she's been on them for the last 2 years she's been completely stable. She takes his medicines regularly, does not feel oversedated and has no side effects. The patient denies being depressed. She is sleeping and eating well. She's got good energy. She didn't think and concentrate well. Her anxiety level is low. She denies auditory hallucinations or visual hallucinations. She denies paranoia. The patient denies any chest pain or shortness of breath. She has chronic back pain. She denies any visual changes or any focal neurological complaints. The patient has not fallen and does not feel oversedated. This patient has never had a seizure. It is suspected that she is on high-dose Cymbalta not only for depression for her pain. Today we reviewed her medications and talked about the pros and cons of these agents. The patient therefore agreed to continue taking his medicines as prescribed. She also agreed to return to see me in 3 months ADL's:  Intact  Sleep: Good  Appetite:  Good  Suicidal Ideation:  no Homicidal  Ideation:  none AEB (as evidenced by):  Psychiatric Specialty Exam: ROS  There were no vitals taken for this visit.There is no weight on file to calculate BMI.  General Appearance: Casual  Eye Contact::  Good  Speech:  Normal Rate  Volume:  Normal  Mood:  Euthymic  Affect:  Appropriate  Thought Process:  Coherent  Orientation:  Full (Time, Place, and Person)  Thought Content:  WDL  Suicidal Thoughts:  No  Homicidal Thoughts:  No  Memory:  NA  Judgement:  Good  Insight:  Good  Psychomotor Activity:  Normal  Concentration:  Good  Recall:  Good  Akathisia:  No  Handed:  Right  AIMS (if indicated):     Assets:  Desire for Improvement  Sleep:      Current Medications: Current Outpatient Prescriptions  Medication Sig Dispense Refill  . buPROPion (WELLBUTRIN SR) 150 MG 12 hr tablet Take 1 tablet (150 mg total) by mouth 2 (two) times daily in the am and at bedtime..  60 tablet  0  . buPROPion (WELLBUTRIN XL) 300 MG 24 hr tablet Take 1 tablet (300 mg total) by mouth daily.  30 tablet  5  . carbamazepine (TEGRETOL) 200 MG tablet Take 1 tablet (200 mg total) by mouth 2 (two) times daily.  60 tablet  5  . clonazePAM (KLONOPIN) 1 MG tablet Take 3 tablets (3 mg total) by mouth 2 (two) times daily as needed. For anxiety  90 tablet  5  . DULoxetine (CYMBALTA) 60 MG capsule Take 2 capsules (120 mg total) by  mouth daily.  60 capsule  5  . lisinopril (PRINIVIL,ZESTRIL) 20 MG tablet Take 20 mg by mouth daily.        . metFORMIN (FORTAMET) 1000 MG (OSM) 24 hr tablet Take 2 tablets (2,000 mg total) by mouth daily before breakfast.  60 tablet  0  . omeprazole (PRILOSEC) 20 MG capsule Take 1 capsule (20 mg total) by mouth daily.  30 capsule  0  . omeprazole (PRILOSEC) 20 MG capsule Take 1 capsule (20 mg total) by mouth daily.  30 capsule  0  . propranolol (INDERAL) 40 MG tablet Take 40 mg by mouth 3 (three) times daily.        . rosuvastatin (CRESTOR) 20 MG tablet Take 20 mg by mouth daily.         [name] Kitchen topiramate (TOPAMAX) 25 MG capsule Take 50 mg by mouth 2 (two) times daily.       . traZODone (DESYREL) 100 MG tablet Take 2 tablets (200 mg total) by mouth at bedtime.  30 tablet  5  . ziprasidone (GEODON) 40 MG capsule Take 1 capsule (40 mg total) by mouth 2 (two) times daily with a meal.  60 capsule  3  . ziprasidone (GEODON) 80 MG capsule Take 2 capsules (160 mg total) by mouth 2 (two) times daily with a meal.  60 capsule  5  . zolpidem (AMBIEN) 10 MG tablet Take 1 tablet (10 mg total) by mouth at bedtime as needed. For pain  30 tablet  5   No current facility-administered medications for this visit.    Lab Results: No results found for this or any previous visit (from the past 48 hour(s)).  Physical Findings: AIMS:  , ,  ,  ,    CIWA:    COWS:     Treatment Plan Summary: At this time the patient shall continue taking Tegretol 200 mg twice a day, Geodon 80 mg twice a day, Cymbalta 60 mg 2 in the morning, Wellbutrin 300 mg in the morning, Klonopin 1 mg 3 times a day and trazodone 100 mg at night. This patient return to see me in approximately 3 months.  Plan:  Medical Decision Making Problem Points:  Established problem, stable/improving (1) Data Points:  Review of new medications or change in dosage (2)  I certify that inpatient services furnished can reasonably be expected to improve the patient's condition.   Lucas Mallow 11/15/2012, 3:48 PM

## 2012-12-06 ENCOUNTER — Ambulatory Visit (INDEPENDENT_AMBULATORY_CARE_PROVIDER_SITE_OTHER): Payer: Medicaid Other | Admitting: Nurse Practitioner

## 2012-12-06 ENCOUNTER — Encounter: Payer: Self-pay | Admitting: Nurse Practitioner

## 2012-12-06 VITALS — BP 102/69 | HR 88 | Ht 63.0 in | Wt 217.0 lb

## 2012-12-06 DIAGNOSIS — R51 Headache: Secondary | ICD-10-CM

## 2012-12-06 DIAGNOSIS — R519 Headache, unspecified: Secondary | ICD-10-CM | POA: Insufficient documentation

## 2012-12-06 MED ORDER — TOPIRAMATE 25 MG PO CPSP: 50.0000 mg | ORAL_CAPSULE | Freq: Two times a day (BID) | ORAL | Status: DC

## 2012-12-06 NOTE — Progress Notes (Signed)
HPI: Patient returns for followup after last visit with Dr. Terrace Arabia 08/30/2011. She has a history of headaches which are currently well controlled on Topamax. She has maybe 2 headaches a month or less. She also has a past medical history of hypertension, diabetes, depression and anxiety treated by Dr. Donell Beers, hyperlipidemia, chronic low back pain, polycystic disease and obesity. She denies any motor or sensory deficits. MRI and MRV of the brain in the past have been normal. She has no new neurologic complaints  ROS:  See about visit information  Physical Exam General: well developed, obese female  seated, in no evident distress Head: head normocephalic and atraumatic. Oropharynx benign Neck: supple with no carotid or supraclavicular bruits Cardiovascular: regular rate and rhythm, no murmurs  Neurologic Exam Mental Status: Awake and fully alert. Oriented to place and time. Follows all commands, speech and language are normal   Cranial Nerves: Fundoscopic exam reveals sharp disc margins. Pupils equal, briskly reactive to light. Extraocular movements full without nystagmus. Visual fields full to confrontation. Hearing intact and symmetric to finger snap. Facial sensation intact. Face, tongue, palate move normally and symmetrically. Neck flexion and extension normal.  Motor: Normal bulk and tone. Normal strength in all tested extremity muscles. No focal weakness Sensory.: intact to touch and pinprick and vibratory.  Coordination: Rapid alternating movements normal in all extremities. Finger-to-nose and heel-to-shin performed accurately bilaterally. Gait and Station: Arises from chair without difficulty. Stance is normal. Gait demonstrates normal stride length and balance . Able to heel, toe and tandem walk without difficulty.  Reflexes: 2+ and symmetric. Toes downgoing.     ASSESSMENT: Headaches in good control on Topamax     PLAN: Continue Topamax 100 mg total dose daily Continue [place] when  necessary Followup yearly and when necessary Prescription will be renewed   Nilda Riggs, GNP-BC APRN

## 2012-12-06 NOTE — Patient Instructions (Addendum)
Headaches are stable Continue Topamax 100 mg total dose daily Continue Cambia when necessary Followup yearly and when necessary Prescriptions will be renewed

## 2012-12-15 DIAGNOSIS — Z01419 Encounter for gynecological examination (general) (routine) without abnormal findings: Secondary | ICD-10-CM | POA: Diagnosis not present

## 2012-12-15 DIAGNOSIS — E282 Polycystic ovarian syndrome: Secondary | ICD-10-CM | POA: Diagnosis not present

## 2012-12-15 DIAGNOSIS — K219 Gastro-esophageal reflux disease without esophagitis: Secondary | ICD-10-CM | POA: Diagnosis not present

## 2012-12-15 DIAGNOSIS — I10 Essential (primary) hypertension: Secondary | ICD-10-CM | POA: Diagnosis not present

## 2012-12-15 DIAGNOSIS — E781 Pure hyperglyceridemia: Secondary | ICD-10-CM | POA: Diagnosis not present

## 2012-12-15 DIAGNOSIS — M545 Low back pain: Secondary | ICD-10-CM | POA: Diagnosis not present

## 2012-12-15 DIAGNOSIS — R7309 Other abnormal glucose: Secondary | ICD-10-CM | POA: Diagnosis not present

## 2012-12-20 DIAGNOSIS — I10 Essential (primary) hypertension: Secondary | ICD-10-CM | POA: Diagnosis not present

## 2012-12-20 DIAGNOSIS — E282 Polycystic ovarian syndrome: Secondary | ICD-10-CM | POA: Diagnosis not present

## 2012-12-20 DIAGNOSIS — K219 Gastro-esophageal reflux disease without esophagitis: Secondary | ICD-10-CM | POA: Diagnosis not present

## 2012-12-20 DIAGNOSIS — R7309 Other abnormal glucose: Secondary | ICD-10-CM | POA: Diagnosis not present

## 2012-12-20 DIAGNOSIS — M545 Low back pain: Secondary | ICD-10-CM | POA: Diagnosis not present

## 2012-12-20 DIAGNOSIS — E781 Pure hyperglyceridemia: Secondary | ICD-10-CM | POA: Diagnosis not present

## 2013-01-05 ENCOUNTER — Ambulatory Visit (HOSPITAL_COMMUNITY)
Admission: RE | Admit: 2013-01-05 | Discharge: 2013-01-05 | Disposition: A | Discharge status: Home / Self Care | Payer: Disability Insurance | Source: Ambulatory Visit | Attending: Family Medicine | Admitting: Family Medicine

## 2013-01-05 ENCOUNTER — Other Ambulatory Visit (HOSPITAL_COMMUNITY): Payer: Self-pay | Admitting: *Deleted

## 2013-01-05 DIAGNOSIS — M545 Low back pain, unspecified: Secondary | ICD-10-CM | POA: Insufficient documentation

## 2013-01-29 DIAGNOSIS — K5732 Diverticulitis of large intestine without perforation or abscess without bleeding: Secondary | ICD-10-CM | POA: Diagnosis present

## 2013-01-29 DIAGNOSIS — E282 Polycystic ovarian syndrome: Secondary | ICD-10-CM | POA: Diagnosis present

## 2013-01-29 DIAGNOSIS — F333 Major depressive disorder, recurrent, severe with psychotic symptoms: Secondary | ICD-10-CM | POA: Diagnosis not present

## 2013-01-29 DIAGNOSIS — E785 Hyperlipidemia, unspecified: Secondary | ICD-10-CM | POA: Diagnosis present

## 2013-01-29 DIAGNOSIS — I10 Essential (primary) hypertension: Secondary | ICD-10-CM | POA: Diagnosis present

## 2013-01-29 DIAGNOSIS — R45851 Suicidal ideations: Secondary | ICD-10-CM | POA: Diagnosis not present

## 2013-01-29 DIAGNOSIS — F319 Bipolar disorder, unspecified: Secondary | ICD-10-CM | POA: Diagnosis present

## 2013-02-14 ENCOUNTER — Ambulatory Visit (INDEPENDENT_AMBULATORY_CARE_PROVIDER_SITE_OTHER): Payer: Medicare Other | Admitting: Psychiatry

## 2013-02-14 DIAGNOSIS — F332 Major depressive disorder, recurrent severe without psychotic features: Secondary | ICD-10-CM

## 2013-02-14 DIAGNOSIS — F333 Major depressive disorder, recurrent, severe with psychotic symptoms: Secondary | ICD-10-CM

## 2013-02-14 MED ORDER — DULOXETINE HCL 60 MG PO CPEP: 120.0000 mg | ORAL_CAPSULE | Freq: Every day | ORAL | Status: DC

## 2013-02-14 MED ORDER — CLONAZEPAM 1 MG PO TABS: 1.0000 mg | ORAL_TABLET | Freq: Three times a day (TID) | ORAL | Status: DC

## 2013-02-14 MED ORDER — ZIPRASIDONE HCL 40 MG PO CAPS: 40.0000 mg | ORAL_CAPSULE | Freq: Two times a day (BID) | ORAL | Status: DC

## 2013-02-14 MED ORDER — CARBAMAZEPINE 200 MG PO TABS: 200.0000 mg | ORAL_TABLET | Freq: Two times a day (BID) | ORAL | Status: DC

## 2013-02-14 MED ORDER — TRAZODONE HCL 100 MG PO TABS: 200.0000 mg | ORAL_TABLET | Freq: Every day | ORAL | Status: DC

## 2013-02-14 MED ORDER — BUPROPION HCL ER (XL) 300 MG PO TB24: 300.0000 mg | ORAL_TABLET | Freq: Every day | ORAL | Status: DC

## 2013-02-14 NOTE — Progress Notes (Signed)
[facility] MD Progress Note  02/14/2013 2:50 PM Jamie [name]  MRN:  [id number] Subjective:  fair Diagnosis:  Axis I: Major Depression, Recurrent severe Today this patient was seen alone and was on time. The patient had significant emotional traumas recently. She is a good friend who attempted suicide but survived. She is another good friend who is receiving chemotherapy. At this time but her friends are doing well. The patient became so upset that she saw a therapist about 2 weeks ago she felt suicidal. Her therapist to help her get into [facility] [place] where she stayed for one week and now has been out for one week. This time she is not suicidal. She denies being depressed. She is sleeping and eating fairly well. In the hospital they changed no medications. She said she really benefited by being in the hospital and talking to people and feeling safe. At this time the patient denies anxiety. She denies psychotic symptoms. This patient never drinks or uses drugs. She is alert and rational. It is noted the patient never made any suicide attempt but simply had suicidal thinking. There are multiple psychiatric hospitalizations her therapist probably felt she needed to be in the hospital. ADL's:  Intact  Sleep: Good  Appetite:  Good  Suicidal Ideation:  no Homicidal Ideation:  no AEB (as evidenced by):  Psychiatric Specialty Exam: ROS  There were no vitals taken for this visit.There is no weight on file to calculate BMI.  General Appearance: Casual  Eye Contact::  Fair  Speech:  Clear and Coherent  Volume:  Normal  Mood:  Euthymic  Affect:  Appropriate  Thought Process:  Coherent  Orientation:  Full (Time, Place, and Person)  Thought Content:  WDL  Suicidal Thoughts:  No  Homicidal Thoughts:  No  Memory:  nl  Judgement:  Good  Insight:  Good  Psychomotor Activity:  Decreased  Concentration:  Good  Recall:  Fair  Akathisia:  No  Handed:  Right  AIMS (if indicated):     Assets:   Communication Skills  Sleep:      Current Medications: Current Outpatient Prescriptions  Medication Sig Dispense Refill  . buPROPion (WELLBUTRIN SR) 150 MG 12 hr tablet Take 1 tablet (150 mg total) by mouth 2 (two) times daily in the am and at bedtime..  60 tablet  0  . buPROPion (WELLBUTRIN XL) 300 MG 24 hr tablet Take 1 tablet (300 mg total) by mouth daily.  30 tablet  5  . carbamazepine (TEGRETOL) 200 MG tablet Take 1 tablet (200 mg total) by mouth 2 (two) times daily.  60 tablet  5  . clonazePAM (KLONOPIN) 1 MG tablet Take 1 tablet (1 mg total) by mouth 3 (three) times daily.  90 tablet  5  . DULoxetine (CYMBALTA) 60 MG capsule Take 2 capsules (120 mg total) by mouth daily.  60 capsule  5  . lisinopril (PRINIVIL,ZESTRIL) 20 MG tablet Take 20 mg by mouth daily.        . metformin (FORTAMET) 1000 MG (OSM) 24 hr tablet Take 500 mg by mouth 3 (three) times daily.      [name] Kitchen omeprazole (PRILOSEC) 20 MG capsule Take 1 capsule (20 mg total) by mouth daily.  30 capsule  0  . propranolol (INDERAL) 40 MG tablet Take 40 mg by mouth 3 (three) times daily.        . rosuvastatin (CRESTOR) 20 MG tablet Take 20 mg by mouth daily.        [name] Kitchen  topiramate (TOPAMAX) 25 MG capsule Take 2 capsules (50 mg total) by mouth 2 (two) times daily.  120 capsule  11  . traZODone (DESYREL) 100 MG tablet Take 2 tablets (200 mg total) by mouth at bedtime.  60 tablet  5  . ziprasidone (GEODON) 40 MG capsule Take 1 capsule (40 mg total) by mouth 2 (two) times daily with a meal.  60 capsule  3  . zolpidem (AMBIEN) 10 MG tablet Take 10 mg by mouth daily.       No current facility-administered medications for this visit.    Lab Results: No results found for this or any previous visit (from the past 48 hour(s)).  Physical Findings: AIMS:  , ,  ,  ,    CIWA:    COWS:     Treatment Plan Summary: At this time this patient will continue taking all her prescribed medications. Includes Tegretol 200 mg twice a day, Geodon 80 mg 2 a  day, Cymbalta 60 mg 2 in the morning, Wellbutrin 300 mg in the morning, Klonopin 1 mg 3 times a day and trazodone 100 mg at night. Patient will stay in therapy and return to see me in approximately 7-8 weeks patient was instructed that should she start feeling suicidal again to call this office as soon as possible. I do believe this patient is doing much better and no longer is suicidal. I do not think she is a threat to herself.  Plan:  Medical Decision Making Problem Points:  Established problem, worsening (2) Data Points:  Review of medication regiment & side effects (2)  I certify that inpatient services furnished can reasonably be expected to improve the patient's condition.   Jamie [name] 02/14/2013, 2:50 PM

## 2013-02-16 ENCOUNTER — Telehealth (HOSPITAL_COMMUNITY): Payer: Self-pay

## 2013-02-20 ENCOUNTER — Other Ambulatory Visit (HOSPITAL_COMMUNITY): Payer: Self-pay | Admitting: *Deleted

## 2013-02-20 NOTE — Telephone Encounter (Signed)
Call [facility] pharmacy to confirm they did have medications on file. They do and will not need refills that were requested on 02/16/13.

## 2013-03-13 DIAGNOSIS — K603 Anal fistula: Secondary | ICD-10-CM | POA: Diagnosis not present

## 2013-03-16 DIAGNOSIS — K603 Anal fistula: Secondary | ICD-10-CM | POA: Diagnosis not present

## 2013-03-16 DIAGNOSIS — Z885 Allergy status to narcotic agent status: Secondary | ICD-10-CM | POA: Diagnosis not present

## 2013-03-16 DIAGNOSIS — E669 Obesity, unspecified: Secondary | ICD-10-CM | POA: Diagnosis not present

## 2013-03-16 DIAGNOSIS — K219 Gastro-esophageal reflux disease without esophagitis: Secondary | ICD-10-CM | POA: Diagnosis not present

## 2013-03-16 DIAGNOSIS — E781 Pure hyperglyceridemia: Secondary | ICD-10-CM | POA: Diagnosis not present

## 2013-03-16 DIAGNOSIS — I10 Essential (primary) hypertension: Secondary | ICD-10-CM | POA: Diagnosis not present

## 2013-03-16 DIAGNOSIS — Z6838 Body mass index (BMI) 38.0-38.9, adult: Secondary | ICD-10-CM | POA: Diagnosis not present

## 2013-03-16 DIAGNOSIS — Z79899 Other long term (current) drug therapy: Secondary | ICD-10-CM | POA: Diagnosis not present

## 2013-03-16 DIAGNOSIS — E282 Polycystic ovarian syndrome: Secondary | ICD-10-CM | POA: Diagnosis not present

## 2013-04-11 ENCOUNTER — Telehealth (HOSPITAL_COMMUNITY): Payer: Self-pay

## 2013-04-11 NOTE — Telephone Encounter (Signed)
04/11/13 1:36PM Patient called requesting a letter for a disability hearing with[employer]... Please call patient at [phone].[name] KitchenMarguerite Olea

## 2013-04-12 DIAGNOSIS — R7309 Other abnormal glucose: Secondary | ICD-10-CM | POA: Diagnosis not present

## 2013-04-12 DIAGNOSIS — K219 Gastro-esophageal reflux disease without esophagitis: Secondary | ICD-10-CM | POA: Diagnosis not present

## 2013-04-12 DIAGNOSIS — E781 Pure hyperglyceridemia: Secondary | ICD-10-CM | POA: Diagnosis not present

## 2013-04-12 DIAGNOSIS — I10 Essential (primary) hypertension: Secondary | ICD-10-CM | POA: Diagnosis not present

## 2013-04-17 DIAGNOSIS — E781 Pure hyperglyceridemia: Secondary | ICD-10-CM | POA: Diagnosis not present

## 2013-04-17 DIAGNOSIS — K219 Gastro-esophageal reflux disease without esophagitis: Secondary | ICD-10-CM | POA: Diagnosis not present

## 2013-04-17 DIAGNOSIS — Z23 Encounter for immunization: Secondary | ICD-10-CM | POA: Diagnosis not present

## 2013-04-17 DIAGNOSIS — I10 Essential (primary) hypertension: Secondary | ICD-10-CM | POA: Diagnosis not present

## 2013-04-17 DIAGNOSIS — E282 Polycystic ovarian syndrome: Secondary | ICD-10-CM | POA: Diagnosis not present

## 2013-04-17 DIAGNOSIS — M545 Low back pain: Secondary | ICD-10-CM | POA: Diagnosis not present

## 2013-04-17 DIAGNOSIS — R7309 Other abnormal glucose: Secondary | ICD-10-CM | POA: Diagnosis not present

## 2013-04-18 ENCOUNTER — Ambulatory Visit (HOSPITAL_COMMUNITY): Payer: Self-pay | Admitting: Psychiatry

## 2013-04-18 DIAGNOSIS — Z79899 Other long term (current) drug therapy: Secondary | ICD-10-CM | POA: Diagnosis not present

## 2013-04-18 DIAGNOSIS — Z885 Allergy status to narcotic agent status: Secondary | ICD-10-CM | POA: Diagnosis not present

## 2013-04-18 DIAGNOSIS — I10 Essential (primary) hypertension: Secondary | ICD-10-CM | POA: Diagnosis not present

## 2013-04-18 DIAGNOSIS — R7309 Other abnormal glucose: Secondary | ICD-10-CM | POA: Diagnosis not present

## 2013-04-18 DIAGNOSIS — E669 Obesity, unspecified: Secondary | ICD-10-CM | POA: Diagnosis not present

## 2013-04-18 DIAGNOSIS — Z6838 Body mass index (BMI) 38.0-38.9, adult: Secondary | ICD-10-CM | POA: Diagnosis not present

## 2013-04-18 DIAGNOSIS — K603 Anal fistula: Secondary | ICD-10-CM | POA: Diagnosis not present

## 2013-04-18 DIAGNOSIS — E781 Pure hyperglyceridemia: Secondary | ICD-10-CM | POA: Diagnosis not present

## 2013-04-18 DIAGNOSIS — M545 Low back pain: Secondary | ICD-10-CM | POA: Diagnosis not present

## 2013-04-18 DIAGNOSIS — E282 Polycystic ovarian syndrome: Secondary | ICD-10-CM | POA: Diagnosis not present

## 2013-04-18 DIAGNOSIS — K219 Gastro-esophageal reflux disease without esophagitis: Secondary | ICD-10-CM | POA: Diagnosis not present

## 2013-04-25 ENCOUNTER — Ambulatory Visit (INDEPENDENT_AMBULATORY_CARE_PROVIDER_SITE_OTHER): Payer: Medicare Other | Admitting: Psychiatry

## 2013-04-25 VITALS — BP 92/67 | HR 107 | Ht 63.0 in | Wt 213.6 lb

## 2013-04-25 DIAGNOSIS — F332 Major depressive disorder, recurrent severe without psychotic features: Secondary | ICD-10-CM | POA: Diagnosis not present

## 2013-04-25 DIAGNOSIS — F333 Major depressive disorder, recurrent, severe with psychotic symptoms: Secondary | ICD-10-CM

## 2013-04-25 MED ORDER — CLONAZEPAM 1 MG PO TABS: 1.0000 mg | ORAL_TABLET | Freq: Three times a day (TID) | ORAL | Status: DC

## 2013-04-25 MED ORDER — BUPROPION HCL ER (XL) 300 MG PO TB24: 300.0000 mg | ORAL_TABLET | Freq: Every day | ORAL | Status: DC

## 2013-04-25 MED ORDER — DULOXETINE HCL 60 MG PO CPEP: 120.0000 mg | ORAL_CAPSULE | Freq: Every day | ORAL | Status: DC

## 2013-04-25 MED ORDER — CARBAMAZEPINE 200 MG PO TABS: 200.0000 mg | ORAL_TABLET | Freq: Two times a day (BID) | ORAL | Status: DC

## 2013-04-25 MED ORDER — TRAZODONE HCL 100 MG PO TABS: 200.0000 mg | ORAL_TABLET | Freq: Every day | ORAL | Status: DC

## 2013-04-25 MED ORDER — ZIPRASIDONE HCL 40 MG PO CAPS: 40.0000 mg | ORAL_CAPSULE | Freq: Two times a day (BID) | ORAL | Status: DC

## 2013-04-25 NOTE — Progress Notes (Signed)
[facility] MD Progress Note  04/25/2013 2:12 PM SHARETA FISHBAUGH  MRN:  [id number] Subjective:  Feels good At this time the only issue do with anal surgery. She's had 3 procedures and sensing her. She is finally starting to heal. Her girlfriend has cancer is still in the middle treatment and unfortunately has metastatic brain. At this time otherwise the patient fairly stable. We reviewed process last time when she became suicidal and utilized her therapist and out of the hospital for one week. I shared with her that is very appropriate and I confirmed that she had a number for a suicide hotline. At this time the patient is sleeping and eating well. She denies being depressed. She enjoys quilting, watching television and enjoys her 2 dogs and 2 cats. The patient denies any problems thinking or concentrating. She denies being suicidal now. Noted is that she's had 2 past suicide attempts one last September. The patient is in therapy on irregular basis and sees her primary care Dr. routinely every 3 months. The patient has an appointment with her primary care doctor in the next and she was in his baseline labs from that visit. Gen. the patient is doing well. At this time she'll continue on all her medicines. Today reviewed pros and cons of her medications and she agreed take them as prescribed. Diagnosis:   DSM5: Schizophrenia Disorders:   Obsessive-Compulsive Disorders:   Trauma-Stressor Disorders:   Substance/Addictive Disorders:   Depressive Disorders:  Major Depressive Disorder - with Psychotic Features (296.24)  Axis I: Major Depression, Recurrent severe  ADL's:  Intact  Sleep: Good  Appetite:  Fair  Suicidal Ideation:  no Homicidal Ideation:  none AEB (as evidenced by):  Psychiatric Specialty Exam: ROS  Blood pressure 92/67, pulse 107, height 5\' 3"  (1.6 m), weight 213 lb 9.6 oz (96.888 kg).Body mass index is 37.85 kg/(m^2).  General Appearance: Casual  Eye Contact::  Good  Speech:  Clear  and Coherent  Volume:  Normal  Mood:  Euthymic  Affect:  Appropriate  Thought Process:  Coherent  Orientation:  Full (Time, Place, and Person)  Thought Content:  WDL  Suicidal Thoughts:  No  Homicidal Thoughts:  No  Memory:  NA  Judgement:  Good  Insight:  Good  Psychomotor Activity:  Normal  Concentration:  Good  Recall:  Good  Akathisia:  No  Handed:  Right  AIMS (if indicated):     Assets:  Desire for Improvement  Sleep:      Current Medications: Current Outpatient Prescriptions  Medication Sig Dispense Refill  . buPROPion (WELLBUTRIN SR) 150 MG 12 hr tablet Take 1 tablet (150 mg total) by mouth 2 (two) times daily in the am and at bedtime..  60 tablet  0  . buPROPion (WELLBUTRIN XL) 300 MG 24 hr tablet Take 1 tablet (300 mg total) by mouth daily.  30 tablet  5  . carbamazepine (TEGRETOL) 200 MG tablet Take 1 tablet (200 mg total) by mouth 2 (two) times daily.  60 tablet  5  . clonazePAM (KLONOPIN) 1 MG tablet Take 1 tablet (1 mg total) by mouth 3 (three) times daily.  90 tablet  3  . DULoxetine (CYMBALTA) 60 MG capsule Take 2 capsules (120 mg total) by mouth daily.  60 capsule  5  . lisinopril (PRINIVIL,ZESTRIL) 20 MG tablet Take 20 mg by mouth daily.        . metformin (FORTAMET) 1000 MG (OSM) 24 hr tablet Take 500 mg by mouth 3 (three)  times daily.      [name] Kitchen omeprazole (PRILOSEC) 20 MG capsule Take 1 capsule (20 mg total) by mouth daily.  30 capsule  0  . propranolol (INDERAL) 40 MG tablet Take 40 mg by mouth 3 (three) times daily.        . rosuvastatin (CRESTOR) 20 MG tablet Take 20 mg by mouth daily.        [name] Kitchen topiramate (TOPAMAX) 25 MG capsule Take 2 capsules (50 mg total) by mouth 2 (two) times daily.  120 capsule  11  . traZODone (DESYREL) 100 MG tablet Take 2 tablets (200 mg total) by mouth at bedtime.  60 tablet  5  . ziprasidone (GEODON) 40 MG capsule Take 1 capsule (40 mg total) by mouth 2 (two) times daily with a meal.  60 capsule  3  . zolpidem (AMBIEN) 10 MG tablet  Take 10 mg by mouth daily.       No current facility-administered medications for this visit.    Lab Results: No results found for this or any previous visit (from the past 48 hour(s)).  Physical Findings: AIMS:  , ,  ,  ,    CIWA:    COWS:     Treatment Plan Summary: At this time the patient shall continue taking all of her medications. This includes Tegretol 200 mg twice a day, Geodon 80 mg twice a day, Cymbalta 60 mg 2 pills in the morning Wellbutrin 300 mg in the morning and Klonopin 1 mg 3 times a day. Patient also takes trazodone for sleep. The patient will continue in one-to-one therapy and she'll return to see me in 3 months. The patient also received confirmation of the suicidal hotline if she needs it.  Plan:  Medical Decision Making Problem Points:  Established problem, stable/improving (1) Data Points:  Review of medication regiment & side effects (2)  I certify that inpatient services furnished can reasonably be expected to improve the patient's condition.   Takeria Marquina IRVING 04/25/2013, 2:12 PM

## 2013-08-01 ENCOUNTER — Ambulatory Visit (HOSPITAL_COMMUNITY): Payer: Self-pay | Admitting: Psychiatry

## 2013-08-06 ENCOUNTER — Other Ambulatory Visit (HOSPITAL_COMMUNITY): Payer: Self-pay | Admitting: Psychiatry

## 2013-08-10 ENCOUNTER — Other Ambulatory Visit (HOSPITAL_COMMUNITY): Payer: Self-pay | Admitting: Psychiatry

## 2013-08-10 MED ORDER — ZIPRASIDONE HCL 40 MG PO CAPS: 80.0000 mg | ORAL_CAPSULE | Freq: Two times a day (BID) | ORAL | Status: DC

## 2013-08-10 NOTE — Telephone Encounter (Signed)
This patient is now on call for times. Twice yesterday and twice today with no answer. Messages were left on her phone. At this time to clarify I would like her to take Geodon 80 mg twice a day. Today we contacted the pharmacist and clarified that she should get an 80 mg pill to a day taking one in the morning one at night. She received her prescription for 60 of them with 3 refills. If the patient should call back please clarify that with her that it's inconsistent with what she's been taking please contact me again.

## 2013-08-16 NOTE — Telephone Encounter (Signed)
Verified Geodon dose with patient - Take 80 mg in AM and 80 mg in PM.Patient states that is what she takes

## 2013-09-28 ENCOUNTER — Ambulatory Visit (HOSPITAL_COMMUNITY): Payer: Self-pay | Admitting: Psychiatry

## 2013-10-16 DIAGNOSIS — K219 Gastro-esophageal reflux disease without esophagitis: Secondary | ICD-10-CM | POA: Diagnosis not present

## 2013-10-16 DIAGNOSIS — R7309 Other abnormal glucose: Secondary | ICD-10-CM | POA: Diagnosis not present

## 2013-10-16 DIAGNOSIS — I10 Essential (primary) hypertension: Secondary | ICD-10-CM | POA: Diagnosis not present

## 2013-10-16 DIAGNOSIS — E282 Polycystic ovarian syndrome: Secondary | ICD-10-CM | POA: Diagnosis not present

## 2013-10-16 DIAGNOSIS — E781 Pure hyperglyceridemia: Secondary | ICD-10-CM | POA: Diagnosis not present

## 2013-10-23 DIAGNOSIS — M545 Low back pain, unspecified: Secondary | ICD-10-CM | POA: Diagnosis not present

## 2013-10-23 DIAGNOSIS — E781 Pure hyperglyceridemia: Secondary | ICD-10-CM | POA: Diagnosis not present

## 2013-10-23 DIAGNOSIS — R7309 Other abnormal glucose: Secondary | ICD-10-CM | POA: Diagnosis not present

## 2013-10-23 DIAGNOSIS — E282 Polycystic ovarian syndrome: Secondary | ICD-10-CM | POA: Diagnosis not present

## 2013-10-23 DIAGNOSIS — Z23 Encounter for immunization: Secondary | ICD-10-CM | POA: Diagnosis not present

## 2013-10-23 DIAGNOSIS — I10 Essential (primary) hypertension: Secondary | ICD-10-CM | POA: Diagnosis not present

## 2013-10-23 DIAGNOSIS — K219 Gastro-esophageal reflux disease without esophagitis: Secondary | ICD-10-CM | POA: Diagnosis not present

## 2013-10-25 DIAGNOSIS — M79609 Pain in unspecified limb: Secondary | ICD-10-CM | POA: Diagnosis not present

## 2013-10-25 DIAGNOSIS — M5126 Other intervertebral disc displacement, lumbar region: Secondary | ICD-10-CM | POA: Diagnosis not present

## 2013-10-25 DIAGNOSIS — M47817 Spondylosis without myelopathy or radiculopathy, lumbosacral region: Secondary | ICD-10-CM | POA: Diagnosis not present

## 2013-10-25 DIAGNOSIS — Z9889 Other specified postprocedural states: Secondary | ICD-10-CM | POA: Diagnosis not present

## 2013-10-25 DIAGNOSIS — M545 Low back pain, unspecified: Secondary | ICD-10-CM | POA: Diagnosis not present

## 2013-11-15 DIAGNOSIS — R55 Syncope and collapse: Secondary | ICD-10-CM | POA: Diagnosis not present

## 2013-11-15 DIAGNOSIS — F411 Generalized anxiety disorder: Secondary | ICD-10-CM | POA: Diagnosis not present

## 2013-11-15 DIAGNOSIS — E7800 Pure hypercholesterolemia, unspecified: Secondary | ICD-10-CM | POA: Diagnosis not present

## 2013-11-15 DIAGNOSIS — Z79899 Other long term (current) drug therapy: Secondary | ICD-10-CM | POA: Diagnosis not present

## 2013-11-15 DIAGNOSIS — E119 Type 2 diabetes mellitus without complications: Secondary | ICD-10-CM | POA: Diagnosis not present

## 2013-11-15 DIAGNOSIS — R5381 Other malaise: Secondary | ICD-10-CM | POA: Diagnosis not present

## 2013-11-15 DIAGNOSIS — Z885 Allergy status to narcotic agent status: Secondary | ICD-10-CM | POA: Diagnosis not present

## 2013-11-15 DIAGNOSIS — R5383 Other fatigue: Secondary | ICD-10-CM | POA: Diagnosis not present

## 2013-11-15 DIAGNOSIS — F319 Bipolar disorder, unspecified: Secondary | ICD-10-CM | POA: Diagnosis not present

## 2013-11-15 DIAGNOSIS — I10 Essential (primary) hypertension: Secondary | ICD-10-CM | POA: Diagnosis not present

## 2013-11-16 DIAGNOSIS — R55 Syncope and collapse: Secondary | ICD-10-CM | POA: Diagnosis not present

## 2013-11-17 DIAGNOSIS — R55 Syncope and collapse: Secondary | ICD-10-CM | POA: Diagnosis not present

## 2013-11-19 DIAGNOSIS — R5381 Other malaise: Secondary | ICD-10-CM | POA: Diagnosis not present

## 2013-11-19 DIAGNOSIS — R5383 Other fatigue: Secondary | ICD-10-CM | POA: Diagnosis not present

## 2013-11-21 ENCOUNTER — Ambulatory Visit (INDEPENDENT_AMBULATORY_CARE_PROVIDER_SITE_OTHER): Payer: Medicare Other | Admitting: Psychiatry

## 2013-11-21 VITALS — BP 109/73 | HR 117 | Ht 63.0 in | Wt 227.0 lb

## 2013-11-21 DIAGNOSIS — F333 Major depressive disorder, recurrent, severe with psychotic symptoms: Secondary | ICD-10-CM

## 2013-11-21 DIAGNOSIS — F332 Major depressive disorder, recurrent severe without psychotic features: Secondary | ICD-10-CM

## 2013-11-21 MED ORDER — CARBAMAZEPINE 200 MG PO TABS: ORAL_TABLET | ORAL | Status: DC

## 2013-11-21 MED ORDER — ZIPRASIDONE HCL 80 MG PO CAPS: ORAL_CAPSULE | ORAL | Status: DC

## 2013-11-21 MED ORDER — BUPROPION HCL ER (XL) 300 MG PO TB24: 300.0000 mg | ORAL_TABLET | Freq: Every day | ORAL | Status: DC

## 2013-11-21 MED ORDER — DULOXETINE HCL 60 MG PO CPEP: 120.0000 mg | ORAL_CAPSULE | Freq: Every day | ORAL | Status: DC

## 2013-11-21 MED ORDER — CLONAZEPAM 1 MG PO TABS: 1.0000 mg | ORAL_TABLET | Freq: Three times a day (TID) | ORAL | Status: DC

## 2013-11-21 MED ORDER — TRAZODONE HCL 100 MG PO TABS: 200.0000 mg | ORAL_TABLET | Freq: Every day | ORAL | Status: DC

## 2013-11-21 NOTE — Progress Notes (Signed)
[facility]  11/21/2013 2:56 PM Jamie BryantMelinda K Moore  MRN:  [id number] Subjective:  Well Patient is doing fairly well. She seems to be fairly stable. She missed her last meeting with me because she was medically ill. Physically she feels much better. The patient still has a good friend who has a brain metastatic disease and is still quite ill. She has another friend who was suicidal in the past but is doing much better now. The patient still enjoys 2 dogs 2 cats he did her mother's house often and enjoys her sister. The patient says she sleeping and eating well. She is good energy. The patient denies being persistently depressed. The patient denies any psychotic symptomatology. Interview overdiagnosis it is evident this patient has never had a manic episode hence I do not believe she is bipolar disorder. Her medications are mostly consistent with bipolar or major depression. The only agent she's taking his mood stabilizers Tegretol. She thinks the Tegretol voices but at its course is not. The patient continues taking Klonopin 1 mg 3 times a day and trazodone to sleep. Overall the patient is fairly stable. Diagnosis:   DSM5: Schizophrenia Disorders:   Obsessive-Compulsive Disorders:   Trauma-Stressor Disorders:   Substance/Addictive Disorders:   Depressive Disorders:  Major Depressive Disorder - with Psychotic Features (296.24) Total Time spent with patient: 30 minutes  Axis I: Major Depression, Recurrent severe  ADL's:  Intact  Sleep: Good  Appetite:  Good  Suicidal Ideation:  no Homicidal Ideation:  none AEB (as evidenced by):  Psychiatric Specialty Exam: Physical Exam  ROS  Blood pressure 109/73, pulse 117, height 5\' 3"  (1.6 m), weight 227 lb (102.967 kg).Body mass index is 40.22 kg/(m^2).  General Appearance: Casual  Eye Contact::  Good  Speech:  Clear and Coherent  Volume:  Normal  Mood:  Euthymic  Affect her l n  Thought Process:  Coherent  Orientation:  Full (Time,  Place, and Person)  Thought Content:  WDL  Suicidal Thoughts:  No  Homicidal Thoughts:  No  Memory:  NA  Judgement:  Good  Insight:  Good  Psychomotor Activity:  Normal  Concentration:  Good  Recall:  Good  Fund of Knowledge:Good  Language: Good  Akathisia:  No  Handed:  Right  AIMS (if indicated):     Assets:  Communication Skills  Sleep:      Musculoskeletal: Strength & Muscle Tone:  Gait & Station:  Patient leans:   Current Medications: Current Outpatient Prescriptions  Medication Sig Dispense Refill  . buPROPion (WELLBUTRIN SR) 150 MG 12 hr tablet Take 1 tablet (150 mg total) by mouth 2 (two) times daily in the am and at bedtime..  60 tablet  0  . buPROPion (WELLBUTRIN XL) 300 MG 24 hr tablet Take 1 tablet (300 mg total) by mouth daily.  30 tablet  5  . carbamazepine (TEGRETOL) 200 MG tablet 1 qhs  30 tablet  5  . clonazePAM (KLONOPIN) 1 MG tablet Take 1 tablet (1 mg total) by mouth 3 (three) times daily.  90 tablet  3  . DULoxetine (CYMBALTA) 60 MG capsule Take 2 capsules (120 mg total) by mouth daily.  60 capsule  5  . lisinopril (PRINIVIL,ZESTRIL) 20 MG tablet Take 20 mg by mouth daily.        . metformin (FORTAMET) 1000 MG (OSM) 24 hr tablet Take 500 mg by mouth 3 (three) times daily.      [name] Kitchen. omeprazole (PRILOSEC) 20 MG capsule Take 1  capsule (20 mg total) by mouth daily.  30 capsule  0  . propranolol (INDERAL) 40 MG tablet Take 40 mg by mouth 3 (three) times daily.        . rosuvastatin (CRESTOR) 20 MG tablet Take 20 mg by mouth daily.        [name] Kitchen. topiramate (TOPAMAX) 25 MG capsule Take 2 capsules (50 mg total) by mouth 2 (two) times daily.  120 capsule  11  . traZODone (DESYREL) 100 MG tablet Take 2 tablets (200 mg total) by mouth at bedtime.  60 tablet  5  . ziprasidone (GEODON) 40 MG capsule Take 2 capsules (80 mg total) by mouth 2 (two) times daily with a meal.  60 capsule  3  . ziprasidone (GEODON) 80 MG capsule TAKE 2 CAPSULES BY MOUTH TWO TIMES DAILY WITH A MEAL  60  capsule  5   No current facility-administered medications for this visit.    Lab Results: No results found for this or any previous visit (from the past 48 hour(s)).  Physical Findings: AIMS:  , ,  ,  ,    CIWA:    COWS:     Treatment Plan Summary: At this time the patient will continue all her medications except she'll reduce Tegretol from 200 mg twice a day just taking 200 mg at night. Her next visit we shall consider discontinuing Tegretol. She's been on it for over a decade for reasons that she's not clear of. The patient takes Geodon 80 mg twice a day with food which I think is probably very beneficial. The patient continues taking Wellbutrin 3 mg in the morning Klonopin 1 mg 3 times a day and trazodone 200 mg. The patient is an active therapy in her community. The issue is his how she will deal with the death of her) but seems inevitable. This patient to be seen again in 3 months.   Plan:  Medical Decision Making Problem Points:  Established problem, stable/improving (1) Data Points:  Review of medication regiment & side effects (2)  I certify that inpatient services furnished can reasonably be expected to improve the patient's condition.   Archer AsaGerald Mirtie Bastyr 11/21/2013, 2:56 PM

## 2013-12-06 ENCOUNTER — Ambulatory Visit: Payer: Medicaid Other | Admitting: Nurse Practitioner

## 2013-12-06 ENCOUNTER — Telehealth: Payer: Self-pay | Admitting: Nurse Practitioner

## 2013-12-06 NOTE — Telephone Encounter (Signed)
No show for scheduled appt 

## 2014-01-17 ENCOUNTER — Other Ambulatory Visit: Payer: Self-pay | Admitting: Neurosurgery

## 2014-01-17 DIAGNOSIS — Z6841 Body Mass Index (BMI) 40.0 and over, adult: Secondary | ICD-10-CM | POA: Diagnosis not present

## 2014-01-17 DIAGNOSIS — M5126 Other intervertebral disc displacement, lumbar region: Secondary | ICD-10-CM | POA: Diagnosis not present

## 2014-01-17 DIAGNOSIS — M545 Low back pain, unspecified: Secondary | ICD-10-CM | POA: Diagnosis not present

## 2014-01-18 NOTE — Pre-Procedure Instructions (Addendum)
Jamie [name]  01/18/2014   Your procedure is scheduled on:  Tuesday, June 30.  [facility]:00 AM.  Call this number if you have problems the morning of surgery: [phone]   Remember:   Do not eat food or drink liquids after midnight    Take these medicines the morning of surgery with A SIP OF WATER: buPROPion (WELLBUTRIN), clonazePAM (KLONOPIN), DULoxetine (CYMBALTA),  omeprazole (PRILOSEC), propranolol (INDERAL), topiramate (TOPAMAX).              DO NOT take medication for Diabetes the morning of surgery.             Stop taking Aspirin, Coumadin, Plavix, Effient and Herbal medications.  Do not take any NSAIDs ie: Ibuprofen,  Advil,Naproxen or any medication containing Aspirin.    Do not wear jewelry, make-up or nail polish.  Do not wear lotions, powders, or perfumes.  Do not shave 48 hours prior to surgery.  Do not bring valuables to the hospital.              [facility] not [facility] any belongings or valuables.               Contacts, dentures or bridgework may not be worn into surgery.  Leave suitcase in the car. After surgery it may be brought to your room.  For patients admitted to the hospital, discharge time is determined by your treatment team.               Patients discharged the day of surgery will not be allowed to drive home.  Name and phone number of your driver: -   Special Instructions: Review  [place] - Preparing For Surgery.   Please read over the following fact sheets that you were given: Pain Booklet, Coughing and Deep Breathing and Surgical Site Infection Prevention

## 2014-01-21 ENCOUNTER — Encounter (HOSPITAL_COMMUNITY): Payer: Self-pay

## 2014-01-21 ENCOUNTER — Encounter (HOSPITAL_COMMUNITY): Payer: Self-pay | Admitting: Pharmacy Technician

## 2014-01-21 ENCOUNTER — Encounter (HOSPITAL_COMMUNITY)
Admission: RE | Admit: 2014-01-21 | Discharge: 2014-01-21 | Disposition: A | Discharge status: Home / Self Care | Payer: Medicare Other | Source: Ambulatory Visit | Attending: Neurosurgery | Admitting: Neurosurgery

## 2014-01-21 DIAGNOSIS — F411 Generalized anxiety disorder: Secondary | ICD-10-CM | POA: Diagnosis present

## 2014-01-21 DIAGNOSIS — M5126 Other intervertebral disc displacement, lumbar region: Secondary | ICD-10-CM | POA: Diagnosis present

## 2014-01-21 DIAGNOSIS — M539 Dorsopathy, unspecified: Secondary | ICD-10-CM | POA: Diagnosis not present

## 2014-01-21 DIAGNOSIS — Z6841 Body Mass Index (BMI) 40.0 and over, adult: Secondary | ICD-10-CM | POA: Diagnosis not present

## 2014-01-21 DIAGNOSIS — M79609 Pain in unspecified limb: Secondary | ICD-10-CM | POA: Diagnosis not present

## 2014-01-21 DIAGNOSIS — F319 Bipolar disorder, unspecified: Secondary | ICD-10-CM | POA: Diagnosis present

## 2014-01-21 DIAGNOSIS — Z01818 Encounter for other preprocedural examination: Secondary | ICD-10-CM | POA: Diagnosis not present

## 2014-01-21 DIAGNOSIS — IMO0002 Reserved for concepts with insufficient information to code with codable children: Secondary | ICD-10-CM | POA: Diagnosis not present

## 2014-01-21 DIAGNOSIS — I10 Essential (primary) hypertension: Secondary | ICD-10-CM | POA: Diagnosis present

## 2014-01-21 DIAGNOSIS — Z01812 Encounter for preprocedural laboratory examination: Secondary | ICD-10-CM | POA: Diagnosis not present

## 2014-01-21 DIAGNOSIS — K219 Gastro-esophageal reflux disease without esophagitis: Secondary | ICD-10-CM | POA: Diagnosis not present

## 2014-01-21 DIAGNOSIS — E7800 Pure hypercholesterolemia, unspecified: Secondary | ICD-10-CM | POA: Diagnosis present

## 2014-01-21 HISTORY — DX: Gastro-esophageal reflux disease without esophagitis: K21.9

## 2014-01-21 HISTORY — DX: Adverse effect of unspecified anesthetic, initial encounter: T41.45XA

## 2014-01-21 HISTORY — DX: Polycystic ovarian syndrome: E28.2

## 2014-01-21 HISTORY — DX: Other specified postprocedural states: R11.2

## 2014-01-21 HISTORY — DX: Nausea with vomiting, unspecified: R11.2

## 2014-01-21 HISTORY — DX: Personal history of other diseases of the digestive system: Z87.19

## 2014-01-21 HISTORY — DX: Other specified postprocedural states: Z98.890

## 2014-01-21 LAB — BASIC METABOLIC PANEL
BUN: 17 mg/dL (ref 6–23)
CHLORIDE: 100 meq/L (ref 96–112)
CO2: 24 meq/L (ref 19–32)
Calcium: 9.6 mg/dL (ref 8.4–10.5)
Creatinine, Ser: 0.72 mg/dL (ref 0.50–1.10)
GFR calc Af Amer: >90 mL/min (ref >90)
GFR calc non Af Amer: >90 mL/min (ref >90)
Glucose, Bld: 96 mg/dL (ref 70–99)
Potassium: 4.5 mEq/L (ref 3.7–5.3)
SODIUM: 140 meq/L (ref 137–147)

## 2014-01-21 LAB — SURGICAL PCR SCREEN
MRSA, PCR: NEGATIVE
STAPHYLOCOCCUS AUREUS: NEGATIVE

## 2014-01-21 LAB — HCG, SERUM, QUALITATIVE: Preg, Serum: NEGATIVE

## 2014-01-21 LAB — CBC
HCT: 39.9 % (ref 36.0–46.0)
HEMOGLOBIN: 12.8 g/dL (ref 12.0–15.0)
MCH: 28.9 pg (ref 26.0–34.0)
MCHC: 32.1 g/dL (ref 30.0–36.0)
MCV: 90.1 fL (ref 78.0–100.0)
Platelets: 197 10*3/uL (ref 150–400)
RBC: 4.43 MIL/uL (ref 3.87–5.11)
RDW: 14 % (ref 11.5–15.5)
WBC: 7.8 10*3/uL (ref 4.0–10.5)
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
## 2014-01-22 ENCOUNTER — Inpatient Hospital Stay (HOSPITAL_COMMUNITY)
Admission: RE | Admit: 2014-01-22 | Discharge: 2014-01-26 | DRG: 519 | Disposition: A | Discharge status: Home / Self Care | Payer: Medicare Other | Source: Ambulatory Visit | Attending: Neurosurgery | Admitting: Neurosurgery

## 2014-01-22 ENCOUNTER — Encounter (HOSPITAL_COMMUNITY): Payer: Self-pay | Admitting: *Deleted

## 2014-01-22 ENCOUNTER — Encounter (HOSPITAL_COMMUNITY): Payer: Medicare Other | Admitting: Certified Registered"

## 2014-01-22 ENCOUNTER — Ambulatory Visit (HOSPITAL_COMMUNITY): Payer: Medicare Other

## 2014-01-22 ENCOUNTER — Encounter (HOSPITAL_COMMUNITY)
Admission: RE | Disposition: A | Discharge status: Home / Self Care | Payer: Self-pay | Source: Ambulatory Visit | Attending: Neurosurgery

## 2014-01-22 ENCOUNTER — Ambulatory Visit (HOSPITAL_COMMUNITY): Payer: Medicare Other | Admitting: Certified Registered"

## 2014-01-22 DIAGNOSIS — Z6841 Body Mass Index (BMI) 40.0 and over, adult: Secondary | ICD-10-CM | POA: Diagnosis not present

## 2014-01-22 DIAGNOSIS — I10 Essential (primary) hypertension: Secondary | ICD-10-CM | POA: Diagnosis present

## 2014-01-22 DIAGNOSIS — M539 Dorsopathy, unspecified: Secondary | ICD-10-CM | POA: Diagnosis not present

## 2014-01-22 DIAGNOSIS — M5126 Other intervertebral disc displacement, lumbar region: Principal | ICD-10-CM | POA: Diagnosis present

## 2014-01-22 DIAGNOSIS — E7800 Pure hypercholesterolemia, unspecified: Secondary | ICD-10-CM | POA: Diagnosis present

## 2014-01-22 DIAGNOSIS — Z01812 Encounter for preprocedural laboratory examination: Secondary | ICD-10-CM

## 2014-01-22 DIAGNOSIS — M79609 Pain in unspecified limb: Secondary | ICD-10-CM | POA: Diagnosis present

## 2014-01-22 DIAGNOSIS — K219 Gastro-esophageal reflux disease without esophagitis: Secondary | ICD-10-CM | POA: Diagnosis not present

## 2014-01-22 DIAGNOSIS — F319 Bipolar disorder, unspecified: Secondary | ICD-10-CM | POA: Diagnosis present

## 2014-01-22 DIAGNOSIS — F411 Generalized anxiety disorder: Secondary | ICD-10-CM | POA: Diagnosis present

## 2014-01-22 DIAGNOSIS — Z01818 Encounter for other preprocedural examination: Secondary | ICD-10-CM | POA: Diagnosis not present

## 2014-01-22 DIAGNOSIS — IMO0002 Reserved for concepts with insufficient information to code with codable children: Secondary | ICD-10-CM | POA: Diagnosis not present

## 2014-01-22 HISTORY — PX: LUMBAR LAMINECTOMY/DECOMPRESSION MICRODISCECTOMY: SHX5026

## 2014-01-22 LAB — GLUCOSE, CAPILLARY: Glucose-Capillary: 94 mg/dL (ref 70–99)

## 2014-01-22 SURGERY — LUMBAR LAMINECTOMY/DECOMPRESSION MICRODISCECTOMY 1 LEVEL
Anesthesia: General | Laterality: Right

## 2014-01-22 MED ORDER — ONDANSETRON HCL 4 MG/2ML IJ SOLN
INTRAMUSCULAR | Status: AC
  Filled 2014-01-22: qty 2

## 2014-01-22 MED ORDER — PHENOL 1.4 % MT LIQD: 1.0000 | OROMUCOSAL | Status: DC | PRN

## 2014-01-22 MED ORDER — OXYCODONE HCL 5 MG/5ML PO SOLN: 5.0000 mg | Freq: Once | ORAL | Status: DC | PRN

## 2014-01-22 MED ORDER — MIDAZOLAM HCL 5 MG/5ML IJ SOLN
INTRAMUSCULAR | Status: DC | PRN
  Administered 2014-01-22 (×2): 1 mg via INTRAVENOUS

## 2014-01-22 MED ORDER — 0.9 % SODIUM CHLORIDE (POUR BTL) OPTIME
TOPICAL | Status: DC | PRN
  Administered 2014-01-22: 1000 mL

## 2014-01-22 MED ORDER — SODIUM CHLORIDE 0.9 % IV SOLN: 250.0000 mL | INTRAVENOUS | Status: DC

## 2014-01-22 MED ORDER — PHENYLEPHRINE HCL 10 MG/ML IJ SOLN
INTRAMUSCULAR | Status: DC | PRN
  Administered 2014-01-22: 40 ug via INTRAVENOUS
  Administered 2014-01-22 (×3): 80 ug via INTRAVENOUS

## 2014-01-22 MED ORDER — CARBAMAZEPINE 200 MG PO TABS
200.0000 mg | ORAL_TABLET | Freq: Every day | ORAL | Status: DC
  Administered 2014-01-22 – 2014-01-25 (×4): 200 mg via ORAL
  Filled 2014-01-22 (×4): qty 1

## 2014-01-22 MED ORDER — LIDOCAINE HCL (CARDIAC) 20 MG/ML IV SOLN
INTRAVENOUS | Status: AC
  Filled 2014-01-22: qty 5

## 2014-01-22 MED ORDER — BACITRACIN ZINC 500 UNIT/GM EX OINT
TOPICAL_OINTMENT | CUTANEOUS | Status: DC | PRN
  Administered 2014-01-22: 1 via TOPICAL

## 2014-01-22 MED ORDER — HYDROMORPHONE HCL PF 1 MG/ML IJ SOLN
INTRAMUSCULAR | Status: AC
  Filled 2014-01-22: qty 1

## 2014-01-22 MED ORDER — TRAZODONE HCL 100 MG PO TABS
200.0000 mg | ORAL_TABLET | Freq: Every day | ORAL | Status: DC
  Administered 2014-01-22 – 2014-01-25 (×4): 200 mg via ORAL
  Filled 2014-01-22 (×4): qty 2

## 2014-01-22 MED ORDER — ROCURONIUM BROMIDE 100 MG/10ML IV SOLN
INTRAVENOUS | Status: DC | PRN
  Administered 2014-01-22: 50 mg via INTRAVENOUS

## 2014-01-22 MED ORDER — ZIPRASIDONE HCL 80 MG PO CAPS
160.0000 mg | ORAL_CAPSULE | Freq: Two times a day (BID) | ORAL | Status: DC
  Administered 2014-01-22 – 2014-01-26 (×8): 160 mg via ORAL
  Filled 2014-01-22 (×11): qty 2

## 2014-01-22 MED ORDER — GLYCOPYRROLATE 0.2 MG/ML IJ SOLN
INTRAMUSCULAR | Status: AC
  Filled 2014-01-22: qty 3

## 2014-01-22 MED ORDER — LIDOCAINE HCL (CARDIAC) 20 MG/ML IV SOLN
INTRAVENOUS | Status: DC | PRN
  Administered 2014-01-22: 40 mg via INTRAVENOUS

## 2014-01-22 MED ORDER — BUPROPION HCL ER (XL) 150 MG PO TB24
300.0000 mg | ORAL_TABLET | Freq: Every day | ORAL | Status: DC
  Administered 2014-01-22 – 2014-01-26 (×5): 300 mg via ORAL
  Filled 2014-01-22: qty 1
  Filled 2014-01-22 (×5): qty 2
  Filled 2014-01-22: qty 1

## 2014-01-22 MED ORDER — MEPERIDINE HCL 25 MG/ML IJ SOLN: 6.2500 mg | INTRAMUSCULAR | Status: DC | PRN

## 2014-01-22 MED ORDER — CEFAZOLIN SODIUM-DEXTROSE 2-3 GM-% IV SOLR
INTRAVENOUS | Status: AC
  Administered 2014-01-22: 2 g via INTRAVENOUS
  Filled 2014-01-22: qty 50

## 2014-01-22 MED ORDER — LISINOPRIL 20 MG PO TABS
20.0000 mg | ORAL_TABLET | Freq: Every day | ORAL | Status: DC
  Administered 2014-01-22: 20 mg via ORAL
  Filled 2014-01-22 (×2): qty 1

## 2014-01-22 MED ORDER — PROPRANOLOL HCL 40 MG PO TABS
40.0000 mg | ORAL_TABLET | Freq: Three times a day (TID) | ORAL | Status: DC
  Administered 2014-01-26: 40 mg via ORAL
  Filled 2014-01-22 (×14): qty 1

## 2014-01-22 MED ORDER — FENTANYL CITRATE 0.05 MG/ML IJ SOLN
INTRAMUSCULAR | Status: AC
  Filled 2014-01-22: qty 5

## 2014-01-22 MED ORDER — SODIUM CHLORIDE 0.9 % IJ SOLN: 3.0000 mL | INTRAMUSCULAR | Status: DC | PRN

## 2014-01-22 MED ORDER — LACTATED RINGERS IV SOLN
INTRAVENOUS | Status: DC
  Administered 2014-01-22: 08:00:00 via INTRAVENOUS

## 2014-01-22 MED ORDER — TOPIRAMATE 25 MG PO CPSP: 50.0000 mg | ORAL_CAPSULE | Freq: Two times a day (BID) | ORAL | Status: DC

## 2014-01-22 MED ORDER — OXYCODONE HCL 5 MG PO TABS: 5.0000 mg | ORAL_TABLET | Freq: Once | ORAL | Status: DC | PRN

## 2014-01-22 MED ORDER — GLYCOPYRROLATE 0.2 MG/ML IJ SOLN
INTRAMUSCULAR | Status: DC | PRN
  Administered 2014-01-22: .8 mg via INTRAVENOUS

## 2014-01-22 MED ORDER — HEMOSTATIC AGENTS (NO CHARGE) OPTIME
TOPICAL | Status: DC | PRN
  Administered 2014-01-22: 1 via TOPICAL

## 2014-01-22 MED ORDER — MIDAZOLAM HCL 2 MG/2ML IJ SOLN: 0.5000 mg | Freq: Once | INTRAMUSCULAR | Status: DC | PRN

## 2014-01-22 MED ORDER — SODIUM CHLORIDE 0.9 % IV SOLN
INTRAVENOUS | Status: DC
  Administered 2014-01-22 – 2014-01-25 (×5): via INTRAVENOUS

## 2014-01-22 MED ORDER — HYDROMORPHONE HCL PF 1 MG/ML IJ SOLN
0.2500 mg | INTRAMUSCULAR | Status: DC | PRN
  Administered 2014-01-22: 0.25 mg via INTRAVENOUS

## 2014-01-22 MED ORDER — ACETAMINOPHEN 325 MG PO TABS
650.0000 mg | ORAL_TABLET | ORAL | Status: DC | PRN
Start: 2014-01-22 — End: 2014-01-26
  Administered 2014-01-23 (×2): 650 mg via ORAL
  Filled 2014-01-22 (×2): qty 2

## 2014-01-22 MED ORDER — PANTOPRAZOLE SODIUM 40 MG PO TBEC
40.0000 mg | DELAYED_RELEASE_TABLET | Freq: Every day | ORAL | Status: DC
Start: 2014-01-22 — End: 2014-01-26
  Administered 2014-01-23 – 2014-01-26 (×4): 40 mg via ORAL
  Filled 2014-01-22 (×6): qty 1

## 2014-01-22 MED ORDER — ATORVASTATIN CALCIUM 10 MG PO TABS
20.0000 mg | ORAL_TABLET | Freq: Every day | ORAL | Status: DC
  Administered 2014-01-22 – 2014-01-25 (×4): 20 mg via ORAL
  Filled 2014-01-22: qty 1
  Filled 2014-01-22 (×2): qty 2
  Filled 2014-01-22: qty 1
  Filled 2014-01-22: qty 2

## 2014-01-22 MED ORDER — DULOXETINE HCL 60 MG PO CPEP
120.0000 mg | ORAL_CAPSULE | Freq: Every day | ORAL | Status: DC
  Administered 2014-01-23 – 2014-01-26 (×4): 120 mg via ORAL
  Filled 2014-01-22 (×4): qty 2

## 2014-01-22 MED ORDER — SCOPOLAMINE 1 MG/3DAYS TD PT72
MEDICATED_PATCH | TRANSDERMAL | Status: AC
  Administered 2014-01-22: 1.5 mg via TRANSDERMAL
  Filled 2014-01-22: qty 1

## 2014-01-22 MED ORDER — CEFAZOLIN SODIUM 1-5 GM-% IV SOLN
1.0000 g | Freq: Three times a day (TID) | INTRAVENOUS | Status: AC
  Administered 2014-01-22 – 2014-01-23 (×2): 1 g via INTRAVENOUS
  Filled 2014-01-22 (×2): qty 50

## 2014-01-22 MED ORDER — TOPIRAMATE 25 MG PO TABS
50.0000 mg | ORAL_TABLET | Freq: Two times a day (BID) | ORAL | Status: DC
  Administered 2014-01-22 – 2014-01-26 (×8): 50 mg via ORAL
  Filled 2014-01-22 (×8): qty 2

## 2014-01-22 MED ORDER — ONDANSETRON HCL 4 MG/2ML IJ SOLN
4.0000 mg | INTRAMUSCULAR | Status: DC | PRN
  Administered 2014-01-22 – 2014-01-25 (×10): 4 mg via INTRAVENOUS
  Filled 2014-01-22 (×10): qty 2

## 2014-01-22 MED ORDER — MIDAZOLAM HCL 2 MG/2ML IJ SOLN
INTRAMUSCULAR | Status: AC
  Filled 2014-01-22: qty 2

## 2014-01-22 MED ORDER — ALBUMIN HUMAN 5 % IV SOLN
INTRAVENOUS | Status: DC | PRN
  Administered 2014-01-22: 13:00:00 via INTRAVENOUS

## 2014-01-22 MED ORDER — NEOSTIGMINE METHYLSULFATE 10 MG/10ML IV SOLN
INTRAVENOUS | Status: AC
  Filled 2014-01-22: qty 1

## 2014-01-22 MED ORDER — PROPOFOL 10 MG/ML IV BOLUS
INTRAVENOUS | Status: AC
  Filled 2014-01-22: qty 20

## 2014-01-22 MED ORDER — METFORMIN HCL ER 750 MG PO TB24
1500.0000 mg | ORAL_TABLET | Freq: Every day | ORAL | Status: DC
  Administered 2014-01-23 – 2014-01-26 (×4): 1500 mg via ORAL
  Filled 2014-01-22 (×5): qty 2

## 2014-01-22 MED ORDER — PROMETHAZINE HCL 25 MG/ML IJ SOLN: 6.2500 mg | INTRAMUSCULAR | Status: DC | PRN

## 2014-01-22 MED ORDER — ROCURONIUM BROMIDE 50 MG/5ML IV SOLN
INTRAVENOUS | Status: AC
  Filled 2014-01-22: qty 1

## 2014-01-22 MED ORDER — SODIUM CHLORIDE 0.9 % IJ SOLN
3.0000 mL | Freq: Two times a day (BID) | INTRAMUSCULAR | Status: DC
  Administered 2014-01-22 – 2014-01-25 (×3): 3 mL via INTRAVENOUS

## 2014-01-22 MED ORDER — PROPOFOL 10 MG/ML IV BOLUS
INTRAVENOUS | Status: DC | PRN
  Administered 2014-01-22: 150 mg via INTRAVENOUS

## 2014-01-22 MED ORDER — MORPHINE SULFATE 2 MG/ML IJ SOLN
1.0000 mg | INTRAMUSCULAR | Status: DC | PRN
  Administered 2014-01-24: 2 mg via INTRAVENOUS
  Filled 2014-01-22: qty 1

## 2014-01-22 MED ORDER — PROPRANOLOL HCL 40 MG PO TABS
40.0000 mg | ORAL_TABLET | Freq: Once | ORAL | Status: AC
  Administered 2014-01-22: 40 mg via ORAL
  Filled 2014-01-22 (×2): qty 1

## 2014-01-22 MED ORDER — CLONAZEPAM 1 MG PO TABS
1.0000 mg | ORAL_TABLET | Freq: Three times a day (TID) | ORAL | Status: DC
  Administered 2014-01-22 – 2014-01-26 (×12): 1 mg via ORAL
  Filled 2014-01-22 (×12): qty 1

## 2014-01-22 MED ORDER — MENTHOL 3 MG MT LOZG: 1.0000 | LOZENGE | OROMUCOSAL | Status: DC | PRN

## 2014-01-22 MED ORDER — THROMBIN 5000 UNITS EX SOLR
CUTANEOUS | Status: DC | PRN
  Administered 2014-01-22 (×2): 5000 [IU] via TOPICAL

## 2014-01-22 MED ORDER — NEOSTIGMINE METHYLSULFATE 10 MG/10ML IV SOLN
INTRAVENOUS | Status: DC | PRN
  Administered 2014-01-22: 4 mg via INTRAVENOUS

## 2014-01-22 MED ORDER — OXYCODONE-ACETAMINOPHEN 5-325 MG PO TABS
1.0000 | ORAL_TABLET | ORAL | Status: DC | PRN
  Administered 2014-01-22 – 2014-01-23 (×3): 2 via ORAL
  Administered 2014-01-23: 1 via ORAL
  Administered 2014-01-23 (×2): 2 via ORAL
  Administered 2014-01-24 (×3): 1 via ORAL
  Administered 2014-01-25: 2 via ORAL
  Filled 2014-01-22: qty 1
  Filled 2014-01-22 (×2): qty 2
  Filled 2014-01-22: qty 1
  Filled 2014-01-22 (×2): qty 2
  Filled 2014-01-22 (×2): qty 1
  Filled 2014-01-22 (×3): qty 2

## 2014-01-22 MED ORDER — FENTANYL CITRATE 0.05 MG/ML IJ SOLN
INTRAMUSCULAR | Status: DC | PRN
  Administered 2014-01-22: 150 ug via INTRAVENOUS
  Administered 2014-01-22: 50 ug via INTRAVENOUS
  Administered 2014-01-22 (×2): 25 ug via INTRAVENOUS
  Administered 2014-01-22: 50 ug via INTRAVENOUS

## 2014-01-22 MED ORDER — DIAZEPAM 5 MG PO TABS
5.0000 mg | ORAL_TABLET | Freq: Four times a day (QID) | ORAL | Status: DC | PRN
  Administered 2014-01-23 – 2014-01-25 (×3): 5 mg via ORAL
  Filled 2014-01-22 (×3): qty 1

## 2014-01-22 MED ORDER — ONDANSETRON HCL 4 MG/2ML IJ SOLN
INTRAMUSCULAR | Status: DC | PRN
  Administered 2014-01-22: 4 mg via INTRAVENOUS

## 2014-01-22 MED ORDER — ACETAMINOPHEN 650 MG RE SUPP: 650.0000 mg | RECTAL | Status: DC | PRN

## 2014-01-22 MED ORDER — SCOPOLAMINE 1 MG/3DAYS TD PT72
1.0000 | MEDICATED_PATCH | TRANSDERMAL | Status: DC
  Administered 2014-01-22: 1.5 mg via TRANSDERMAL

## 2014-01-22 MED ORDER — LACTATED RINGERS IV SOLN
INTRAVENOUS | Status: DC | PRN
  Administered 2014-01-22 (×3): via INTRAVENOUS

## 2014-01-22 SURGICAL SUPPLY — 66 items
BENZOIN TINCTURE PRP APPL 2/3 (GAUZE/BANDAGES/DRESSINGS) ×2 IMPLANT
BLADE 10 SAFETY STRL DISP (BLADE) ×2 IMPLANT
BLADE SURG ROTATE 9660 (MISCELLANEOUS) IMPLANT
BUR ACORN 6.0 (BURR) ×2 IMPLANT
BUR MATCHSTICK NEURO 3.0 LAGG (BURR) IMPLANT
CANISTER SUCT 3000ML (MISCELLANEOUS) ×2 IMPLANT
CONT SPEC 4OZ CLIKSEAL STRL BL (MISCELLANEOUS) ×2 IMPLANT
DRAPE LAPAROTOMY 100X72X124 (DRAPES) ×2 IMPLANT
DRAPE MICROSCOPE LEICA (MISCELLANEOUS) ×2 IMPLANT
DRAPE POUCH INSTRU U-SHP 10X18 (DRAPES) ×2 IMPLANT
DURAPREP 26ML APPLICATOR (WOUND CARE) ×2 IMPLANT
DURASEAL APPLICATOR TIP (TIP) ×2 IMPLANT
DURASEAL SPINE SEALANT 3ML (MISCELLANEOUS) ×2 IMPLANT
ELECT BLADE 4.0 EZ CLEAN MEGAD (MISCELLANEOUS) ×2
ELECT REM PT RETURN 9FT ADLT (ELECTROSURGICAL) ×2
ELECTRODE BLDE 4.0 EZ CLN MEGD (MISCELLANEOUS) ×1 IMPLANT
ELECTRODE REM PT RTRN 9FT ADLT (ELECTROSURGICAL) ×1 IMPLANT
GAUZE SPONGE 4X4 16PLY XRAY LF (GAUZE/BANDAGES/DRESSINGS) IMPLANT
GLOVE BIOGEL M 8.0 STRL (GLOVE) ×2 IMPLANT
GLOVE BIOGEL PI IND STRL 7.0 (GLOVE) ×2 IMPLANT
GLOVE BIOGEL PI IND STRL 8 (GLOVE) ×1 IMPLANT
GLOVE BIOGEL PI IND STRL 8.5 (GLOVE) ×1 IMPLANT
GLOVE BIOGEL PI INDICATOR 7.0 (GLOVE) ×2
GLOVE BIOGEL PI INDICATOR 8 (GLOVE) ×1
GLOVE BIOGEL PI INDICATOR 8.5 (GLOVE) ×1
GLOVE ECLIPSE 7.5 STRL STRAW (GLOVE) ×10 IMPLANT
GLOVE ECLIPSE 8.5 STRL (GLOVE) ×2 IMPLANT
GLOVE EXAM NITRILE LRG STRL (GLOVE) IMPLANT
GLOVE EXAM NITRILE MD LF STRL (GLOVE) IMPLANT
GLOVE EXAM NITRILE XL STR (GLOVE) IMPLANT
GLOVE EXAM NITRILE XS STR PU (GLOVE) IMPLANT
GOWN STRL REUS W/ TWL LRG LVL3 (GOWN DISPOSABLE) ×1 IMPLANT
GOWN STRL REUS W/ TWL XL LVL3 (GOWN DISPOSABLE) IMPLANT
GOWN STRL REUS W/TWL 2XL LVL3 (GOWN DISPOSABLE) ×6 IMPLANT
GOWN STRL REUS W/TWL LRG LVL3 (GOWN DISPOSABLE) ×1
GOWN STRL REUS W/TWL XL LVL3 (GOWN DISPOSABLE)
KIT BASIN OR (CUSTOM PROCEDURE TRAY) ×2 IMPLANT
KIT ROOM TURNOVER OR (KITS) ×2 IMPLANT
NEEDLE HYPO 18GX1.5 BLUNT FILL (NEEDLE) IMPLANT
NEEDLE HYPO 21X1.5 SAFETY (NEEDLE) IMPLANT
NEEDLE HYPO 25X1 1.5 SAFETY (NEEDLE) IMPLANT
NEEDLE SPNL 20GX3.5 QUINCKE YW (NEEDLE) IMPLANT
NS IRRIG 1000ML POUR BTL (IV SOLUTION) ×2 IMPLANT
PACK LAMINECTOMY NEURO (CUSTOM PROCEDURE TRAY) ×2 IMPLANT
PAD ABD 8X10 STRL (GAUZE/BANDAGES/DRESSINGS) IMPLANT
PAD ARMBOARD 7.5X6 YLW CONV (MISCELLANEOUS) ×6 IMPLANT
PATTIES SURGICAL .5 X1 (DISPOSABLE) ×2 IMPLANT
RUBBERBAND STERILE (MISCELLANEOUS) ×4 IMPLANT
SPONGE GAUZE 4X4 12PLY (GAUZE/BANDAGES/DRESSINGS) ×2 IMPLANT
SPONGE LAP 4X18 X RAY DECT (DISPOSABLE) IMPLANT
SPONGE SURGIFOAM ABS GEL SZ50 (HEMOSTASIS) ×2 IMPLANT
STRIP CLOSURE SKIN 1/2X4 (GAUZE/BANDAGES/DRESSINGS) ×2 IMPLANT
SUT ETHILON 3 0 FSL (SUTURE) ×2 IMPLANT
SUT PROLENE 6 0 BV (SUTURE) ×8 IMPLANT
SUT VIC AB 0 CT1 18XCR BRD8 (SUTURE) ×1 IMPLANT
SUT VIC AB 0 CT1 8-18 (SUTURE) ×1
SUT VIC AB 2-0 CP2 18 (SUTURE) ×2 IMPLANT
SUT VIC AB 3-0 CP2 18 (SUTURE) ×2 IMPLANT
SUT VIC AB 3-0 SH 8-18 (SUTURE) ×2 IMPLANT
SYR 20CC LL (SYRINGE) IMPLANT
SYR 20ML ECCENTRIC (SYRINGE) ×2 IMPLANT
SYR 5ML LL (SYRINGE) IMPLANT
TAPE CLOTH SURG 4X10 WHT LF (GAUZE/BANDAGES/DRESSINGS) ×2 IMPLANT
TOWEL OR 17X24 6PK STRL BLUE (TOWEL DISPOSABLE) ×2 IMPLANT
TOWEL OR 17X26 10 PK STRL BLUE (TOWEL DISPOSABLE) ×2 IMPLANT
WATER STERILE IRR 1000ML POUR (IV SOLUTION) ×2 IMPLANT

## 2014-01-22 NOTE — Progress Notes (Signed)
Called office for Dr. Jeral FruitBotero to be paged. He has not signed orders in epic.

## 2014-01-22 NOTE — Anesthesia Postprocedure Evaluation (Signed)
  Anesthesia Post-op Note  Patient: Jamie [name]  Procedure(s) Performed: Procedure(s) with comments: LUMBAR FOUR TO FIVE LUMBAR LAMINECTOMY/DECOMPRESSION MICRODISCECTOMY 1 LEVEL (Right) - Right L45 diskectomy  Patient Location: PACU  Anesthesia Type:General  Level of Consciousness: awake, alert , oriented and patient cooperative  Airway and Oxygen Therapy: Patient Spontanous Breathing and Patient connected to nasal cannula oxygen  Post-op Pain: mild  Post-op Assessment: Post-op Vital signs reviewed, Patient's Cardiovascular Status Stable, Respiratory Function Stable, Patent Airway, No signs of Nausea or vomiting and Pain level controlled  Post-op Vital Signs: Reviewed and stable  Last Vitals:  Filed Vitals:   01/22/14 1430  BP: 104/69  Pulse: 83  Temp: 37.1 C  Resp: 18    Complications: No apparent anesthesia complications

## 2014-01-22 NOTE — Transfer of Care (Signed)
Immediate Anesthesia Transfer of Care Note  Patient: Jamie [name]  Procedure(s) Performed: Procedure(s) with comments: LUMBAR FOUR TO FIVE LUMBAR LAMINECTOMY/DECOMPRESSION MICRODISCECTOMY 1 LEVEL (Right) - Right L45 diskectomy  Patient Location: PACU  Anesthesia Type:General  Level of Consciousness: awake and alert   Airway & Oxygen Therapy: Patient Spontanous Breathing and Patient connected to face mask oxygen  Post-op Assessment: Report given to PACU RN and Post -op Vital signs reviewed and stable  Post vital signs: Reviewed and stable  Complications: No apparent anesthesia complications

## 2014-01-22 NOTE — Progress Notes (Signed)
Pt arrived to the unit via stretcher at 1425. Pt A&O x4; IV line intact and infusing; back incision dsg clean dry and intact; pain assessed; pt oriented to the room and unit policies & protocols; pt given pt hand book; pt due to void per report; pt on 4 liters oxygen d/t  Low O2 saturations; pt in bed with call light within reach will continue to monitor. Jamie MerlesP. Amo Kuffour RN.

## 2014-01-22 NOTE — Anesthesia Procedure Notes (Signed)
Procedure Name: Intubation Date/Time: 01/22/2014 10:38 AM Performed by: Armandina GemmaMIRARCHI, ANGELA Pre-anesthesia Checklist: Patient identified, Timeout performed, Emergency Drugs available, Suction available and Patient being monitored Patient Re-evaluated:Patient Re-evaluated prior to inductionOxygen Delivery Method: Circle system utilized Preoxygenation: Pre-oxygenation with 100% oxygen Intubation Type: IV induction Ventilation: Mask ventilation without difficulty Grade View: Grade I Tube type: Oral Tube size: 7.0 mm Number of attempts: 1 Airway Equipment and Method: Stylet and Video-laryngoscopy Placement Confirmation: breath sounds checked- equal and bilateral,  ETT inserted through vocal cords under direct vision and positive ETCO2 Secured at: 22 cm Tube secured with: Tape Dental Injury: Teeth and Oropharynx as per pre-operative assessment  Comments: IV induction Jackson - intubation AM CRNA- atraumatic- teeth and mouth as preop- + BS bilat Jackson- + ETCO2

## 2014-01-22 NOTE — Progress Notes (Signed)
1800 pt ambulated to BR with supervision; voided x1. Reported off to incoming RN. Dionne BucyP. Amo Kuffour RN

## 2014-01-22 NOTE — Anesthesia Preprocedure Evaluation (Addendum)
Anesthesia Evaluation  Patient identified by MRN, date of birth, ID band Patient awake    Reviewed: Allergy & Precautions, H&P , NPO status , Patient's Chart, lab work & pertinent test results, reviewed documented beta blocker date and time   History of Anesthesia Complications (+) PONV and history of anesthetic complications  Airway Mallampati: III TM Distance: >3 FB Neck ROM: Full  Mouth opening: Limited Mouth Opening  Dental  (+) Dental Advisory Given, Chipped   Pulmonary neg pulmonary ROS,  breath sounds clear to auscultation  Pulmonary exam normal       Cardiovascular hypertension, Pt. on medications and Pt. on home beta blockers - anginaRhythm:Regular Rate:Normal     Neuro/Psych Anxiety Depression Bipolar Disorder Chronic back pain: tylenol    GI/Hepatic Neg liver ROS, hiatal hernia, GERD-  Medicated and Controlled,  Endo/Other  negative endocrine ROS  Renal/GU negative Renal ROS     Musculoskeletal   Abdominal (+) + obese,   Peds  Hematology negative hematology ROS (+)   Anesthesia Other Findings   Reproductive/Obstetrics LMP 01/07/14 01/21/14 preg test: NEG Polycystic ovarian: metformin                          Anesthesia Physical Anesthesia Plan  ASA: III  Anesthesia Plan: General   Post-op Pain Management:    Induction: Intravenous  Airway Management Planned: Oral ETT and Video Laryngoscope Planned  Additional Equipment:   Intra-op Plan:   Post-operative Plan: Extubation in OR  Informed Consent: I have reviewed the patients History and Physical, chart, labs and discussed the procedure including the risks, benefits and alternatives for the proposed anesthesia with the patient or authorized representative who has indicated his/her understanding and acceptance.   Dental advisory given  Plan Discussed with: CRNA and Surgeon  Anesthesia Plan Comments: (Plan routine  monitors, GETA)        Anesthesia Quick Evaluation

## 2014-01-23 ENCOUNTER — Encounter (HOSPITAL_COMMUNITY): Payer: Self-pay | Admitting: Neurosurgery

## 2014-01-23 LAB — CBC WITH DIFFERENTIAL/PLATELET
Basophils Absolute: 0 10*3/uL (ref 0.0–0.1)
Basophils Relative: 0 % (ref 0–1)
Eosinophils Absolute: 0 10*3/uL (ref 0.0–0.7)
Eosinophils Relative: 0 % (ref 0–5)
HEMATOCRIT: 31 % — AB (ref 36.0–46.0)
Hemoglobin: 9.7 g/dL — ABNORMAL LOW (ref 12.0–15.0)
LYMPHS ABS: 1.8 10*3/uL (ref 0.7–4.0)
LYMPHS PCT: 22 % (ref 12–46)
MCH: 29 pg (ref 26.0–34.0)
MCHC: 31.3 g/dL (ref 30.0–36.0)
MCV: 92.8 fL (ref 78.0–100.0)
Monocytes Absolute: 0.9 10*3/uL (ref 0.1–1.0)
Monocytes Relative: 11 % (ref 3–12)
Neutro Abs: 5.5 10*3/uL (ref 1.7–7.7)
Neutrophils Relative %: 67 % (ref 43–77)
Platelets: 155 10*3/uL (ref 150–400)
RBC: 3.34 MIL/uL — ABNORMAL LOW (ref 3.87–5.11)
RDW: 14.5 % (ref 11.5–15.5)
WBC: 8.1 10*3/uL (ref 4.0–10.5)

## 2014-01-23 MED ORDER — ALBUMIN HUMAN 5 % IV SOLN
12.5000 g | Freq: Once | INTRAVENOUS | Status: AC
  Administered 2014-01-23: 12.5 g via INTRAVENOUS
  Filled 2014-01-23: qty 250

## 2014-01-23 MED ORDER — SODIUM CHLORIDE 0.9 % IV SOLN
Freq: Once | INTRAVENOUS | Status: AC
Start: 2014-01-23 — End: 2014-01-24
  Administered 2014-01-24: 04:00:00 via INTRAVENOUS

## 2014-01-23 MED ORDER — SODIUM CHLORIDE 0.9 % IV BOLUS (SEPSIS)
500.0000 mL | Freq: Once | INTRAVENOUS | Status: AC
  Administered 2014-01-23: 500 mL via INTRAVENOUS

## 2014-01-23 NOTE — Progress Notes (Signed)
Read, reviewed, edited and agree with student's findings and recommendations.  Doyt Castellana B. Normal Recinos, PT, DPT #[phone]  

## 2014-01-23 NOTE — Progress Notes (Signed)
Pt BP 81/49 with 101 pulse; pt asymptomatic and denies any dizziness as well. Dr Jeral FruitBotero notified and new orders received for CBC and bolus. Will continue to monitor pt quietly. P. Amo Allea Kassner Rn.

## 2014-01-23 NOTE — Progress Notes (Signed)
Patient ID: [name], female   DOB: 1971/05/07, 43 y.o.   MRN: [id number] C/o incisional pain with some numbnee right leg, no weakness. Blood pressure in the 80s. Cbc [pending. No headache

## 2014-01-23 NOTE — Progress Notes (Signed)
UR complete.  Naome Brigandi RN, MSN 

## 2014-01-23 NOTE — Progress Notes (Signed)
OT Cancellation Note  Patient Details Name: Jefm BryantMelinda K Moore MRN: [id number] DOB: Nov 28, 1970   Cancelled Treatment:    Reason Eval/Treat Not Completed: Patient not medically ready (low BP 73/39 and symptomatic currently)  Harolyn RutherfordJones, Laneka Mcgrory B Pager: [phone]: [phone]  01/23/2014, 2:16 PM

## 2014-01-23 NOTE — Progress Notes (Signed)
Pt BP continue to remain in the 80's; MD in to see pt and asked to call back with CBC results. MD called back with CBC results and new order received for Albumin 5% . Awaiting on medication and will continue to monitor pt quietly as pt continue to remain asymptomatic. P. Amo Yorel Redder RN.

## 2014-01-23 NOTE — Op Note (Signed)
[name], Jamie               ACCOUNT NO.:  [id number]  MEDICAL RECORD NO.:  [id number]  LOCATION:  [id number]                        FACILITY:  [facility]  PHYSICIAN:  Hilda LiasErnesto Jennifr Gaeta, M.D.   DATE OF BIRTH:  [birth year]  DATE OF PROCEDURE:  01/22/2014 DATE OF DISCHARGE:                              OPERATIVE REPORT   PREOPERATIVE DIAGNOSES:  Recurrent right L4-5 herniated disk.  Morbid obesity.  POSTOPERATIVE DIAGNOSES:  Recurrent right L4-5 herniated disk.  Morbid obesity.  PROCEDURE:  Right L4-5 diskectomy, decompression of the thecal sac. Foraminotomy.  Repair of the duratotomy.  Microscope.  SURGEON:  Hilda LiasErnesto Aceyn Kathol, M.D.  ASSISTANT:  Dr. Danielle DessElsner.  CLINICAL HISTORY:  Jamie [name] is a 43 year old female who about 5 years ago underwent right L4-5 diskectomy.  The patient did well but lately for the past 6 months she is getting worse.  The pain gets worse when she sits and when she walks.  It is mostly going to the right side.  She has failed with conservative treatment.  MRI shows recurrent calcified disk at the level of 4-5.  She knew the risk of the surgery including infection, CSF leak, no improvement whatsoever, and need for further surgery.  PROCEDURE:  The patient was taken to the OR, and after intubation, she was positioned on prone manner.  The skin was cleaned with DuraPrep. Because of her obesity, we were unable to feel any bony structure.  We opened following the previous incision through the skin, through a thick adipose tissue down to the lumbar spine.  We took 3 x-rays to be sure where we are.  The last one we were right at the 3-4.  From then on, after we did a lysis of adhesions, we dropped 1 level below.  Because of the depthness, we brought the microscope into the area.  With the drill, we drilled the lower lamina of L5 and the upper of L4.  Lysis of adhesions with decompression of the thecal sac was done.  Right at the level of the disk, the thecal sac was  completely adherent to the floor. Lysis was accomplished, and there was an opening into the dura with protrusion of the arachnoid.  We continued our retraction and we were able to get into the disk space.  We found after we did the retraction that patient has a large recurrent calcified disk.  We had to use chisel as well as the drill to be able to decompress the area.  We opened the disk at the end.  We did a total gross medial and lateral diskectomy. At the end, we had a good decompression of the L4, L5 as well as the thecal sac.  Because of the opening in the dura, we used a 6- 0 Prolene to close the dura matter.  This was accomplished and the Valsalva was up to 40 was negative.  Then, surgical glue was left in the epidural space and the wound was closed with different layer of Vicryl and nylon.  The patient is going to go to the recovery room.          ______________________________ Hilda LiasErnesto Yadier Bramhall, M.D.     EB/MEDQ  D:  01/22/2014  T:  01/23/2014  Job:  [id number]

## 2014-01-23 NOTE — Progress Notes (Signed)
PT Cancellation Note  Patient Details Name: Jamie BryantMelinda K [name] MRN: [id number] DOB: 06/16/1971   Cancelled Treatment:    Reason Eval/Treat Not Completed: Medical issues which prohibited therapy (Bp of 73/39 and symptomatic: dizziness, nausea)   Mardi MainlandMackenzie Grey Schlauch, SPT [phone]

## 2014-01-24 MED ORDER — MORPHINE SULFATE 2 MG/ML IJ SOLN
1.0000 mg | INTRAMUSCULAR | Status: DC | PRN
  Administered 2014-01-24 – 2014-01-25 (×4): 2 mg via INTRAVENOUS
  Filled 2014-01-24 (×5): qty 1

## 2014-01-24 NOTE — Progress Notes (Signed)
Patient ID: Jamie [name], female   DOB: [birth year], 43 y.o.   MRN: [id number] less incisional pain, noleg pain or headache. bp around 80. To stop lisinopril. Dc in am

## 2014-01-24 NOTE — Evaluation (Signed)
Physical Therapy Evaluation Patient Details Name: Jamie BryantMelinda K [name] MRN: [id number] DOB: Jul 25, 1971 Today's Date: 01/24/2014   History of Present Illness  s/p L4L5 diskectomy secondary to herniated disc   Clinical Impression  Patient is s/p surgery listed above resulting in the deficits listed below (see PT Problem List). Patient will benefit from skilled PT to increase their independence and safety with mobility (while adhering to their precautions) to allow discharge home with mother. No c/o dizziness or lightheadedness this session. Pt at supervision level for mobility; requires min (A) for bed mobility to achieve sitting position due to increased pain. Pt hopeful to D/C home with mother today. Will benefit from OT evaluation to address ADLs prior to D/C.      Follow Up Recommendations No PT follow up;Supervision/Assistance - 24 hour    Equipment Recommendations  Rolling walker with 5" wheels    Recommendations for Other Services OT consult     Precautions / Restrictions Precautions Precautions: Back Precaution Booklet Issued: Yes (comment) Precaution Comments: educated throughout on back precautions  Restrictions Weight Bearing Restrictions: No      Mobility  Bed Mobility Overal bed mobility: Needs Assistance Bed Mobility: Rolling;Sidelying to Sit;Sit to Sidelying Rolling: Supervision Sidelying to sit: Min assist     Sit to sidelying: Supervision General bed mobility comments: cues for log rolling technique; pt with difficulty elevating trunk due to pain; required min (A) to achieve sitting position  Transfers Overall transfer level: Needs assistance Equipment used: None;Rolling walker (2 wheeled) Transfers: Sit to/from Stand Sit to Stand: Supervision         General transfer comment: cues for hand placement and safety with RW  Ambulation/Gait Ambulation/Gait assistance: Supervision Ambulation Distance (Feet): 110 Feet Assistive device: None;Rolling walker (2  wheeled) Gait Pattern/deviations: Step-through pattern;Decreased stride length;Shuffle;Narrow base of support Gait velocity: decreased due to pain Gait velocity interpretation: Below normal speed for age/gender General Gait Details: initially ambulated with AD; pt with very significant short shuffled steps; demo increased stability and slightly longer step length with RW; cues for sequencing and to adhere to back precautions with directional changes  Stairs            Wheelchair Mobility    Modified Rankin (Stroke Patients Only)       Balance Overall balance assessment: No apparent balance deficits (not formally assessed) Sitting-balance support: Feet supported;No upper extremity supported Sitting balance-Leahy Scale: Fair     Standing balance support: During functional activity;No upper extremity supported Standing balance-Leahy Scale: Fair Standing balance comment: able to stand and weightshift minimally without UE support; guarded due to pain                              Pertinent Vitals/Pain 6/10 premedicated     Home Living Family/patient expects to be discharged to:: Private residence Living Arrangements: Parent Available Help at Discharge: Family;Available 24 hours/day Type of Home: House Home Access: Level entry     Home Layout: One level Home Equipment: Shower seat - built in Additional Comments: handicap bathroom at [employer] [facility] house    Prior Function Level of Independence: Independent               Hand Dominance   Dominant Hand: Right    Extremity/Trunk Assessment   Upper Extremity Assessment: Defer to OT evaluation           Lower Extremity Assessment: Generalized weakness (secondary to surgery)      Cervical /  Trunk Assessment: Other exceptions  Communication   Communication: No difficulties  Cognition Arousal/Alertness: Awake/alert Behavior During Therapy: WFL for tasks assessed/performed Overall Cognitive Status:  Within Functional Limits for tasks assessed                      General Comments      Exercises        Assessment/Plan    PT Assessment Patient needs continued PT services  PT Diagnosis Difficulty walking;Abnormality of gait   PT Problem List Decreased strength;Decreased mobility;Decreased knowledge of use of DME;Decreased knowledge of precautions;Pain  PT Treatment Interventions DME instruction;Gait training;Functional mobility training;Therapeutic activities;Therapeutic exercise;Balance training;Neuromuscular re-education;Patient/family education   PT Goals (Current goals can be found in the Care Plan section) Acute Rehab PT Goals Patient Stated Goal: to go home today PT Goal Formulation: With patient Time For Goal Achievement: 01/27/14 Potential to Achieve Goals: Good    Frequency Min 5X/week   Barriers to discharge        Co-evaluation               End of Session Equipment Utilized During Treatment: Gait belt Activity Tolerance: Patient tolerated treatment well Patient left: in bed;with call bell/phone within reach Nurse Communication: Mobility status         Time: 0823-0839 PT Time Calculation (min): 16 min   Charges:   PT Evaluation $Initial PT Evaluation Tier I: 1 Procedure PT Treatments $Gait Training: 8-22 mins   PT G CodesDonell Sievert:          [name], [place]  [phone] 01/24/2014, 9:22 AM

## 2014-01-24 NOTE — Evaluation (Signed)
Occupational Therapy Evaluation Patient Details Name: Jamie [name] MRN: [id number] DOB: 1971-01-28 Today's Date: 01/24/2014    History of Present Illness s/p L4L5 diskectomy secondary to herniated disc    Clinical Impression   Eval limited due to back pain (8/10) reported during bed mobility. Pt demonstrates decline in function and safety with ADLs and ADL mobility with decreased strength, balance and endurance. Pt would benefit from acute OT services to address impairments to increase level of function and safety    Follow Up Recommendations  No OT follow up;Supervision/Assistance - 24 hour    Equipment Recommendations  3 in 1 bedside comode;None recommended by OT;Other (comment) (ADL A/E kit)    Recommendations for Other Services       Precautions / Restrictions Precautions Precautions: Back Precaution Booklet Issued: Yes (comment) Precaution Comments: pt able to recall 2/3 back precautions, reviewed all back precautions with pt and her mother. Pt able to verbalize log roll technique to get in and out of bed Restrictions Weight Bearing Restrictions: No      Mobility Bed Mobility Overal bed mobility: Needs Assistance Bed Mobility: Rolling;Sidelying to Sit;Sit to Sidelying Rolling: Supervision Sidelying to sit: Min assist     Sit to sidelying: Supervision General bed mobility comments: attempted sup - sit x 3 before being about to sit upright due to pain, Pt sat EOB > 30 seconds before requesitng to return to supine  Transfers                 General transfer comment: pt declined due to back pain. Per PT note pt is sup with transfers using RW    Balance   Sitting-balance support: No upper extremity supported;Feet supported Sitting balance-Leahy Scale: Fair         Standing balance comment: declined standing                            ADL Overall ADL's : Needs assistance/impaired     Grooming: Min guard;Set up;Sitting   Upper Body  Bathing: Min guard;Set up;Sitting   Lower Body Bathing: Maximal assistance   Upper Body Dressing : Min guard;Set up;Sitting   Lower Body Dressing: Maximal assistance     Toilet Transfer Details (indicate cue type and reason): pt declined due to back pain. Per PT note pt is sup with transfers using RW       Tub/Shower Transfer Details (indicate cue type and reason): pt declined due to back pain. Per PT note pt is sup with transfers using RW   General ADL Comments: Pt reports 8/10 back pain during bed mobiluty to sit EOB. Pt moving at slow pace and require increased time to complete EOB tasks. Pt and her mother provided with education and demo of ADL A/E kit, educated on toileting aid and 3 in 1 for use at home     Vision  no change in baseline per pt report                   Perception Perception Perception Tested?: No   Praxis Praxis Praxis tested?: Not tested    Pertinent Vitals/Pain 8/10 back pain, VSS     Hand Dominance Right   Extremity/Trunk Assessment Upper Extremity Assessment Upper Extremity Assessment: Overall WFL for tasks assessed   Lower Extremity Assessment Lower Extremity Assessment: Defer to PT evaluation       Communication Communication Communication: No difficulties   Cognition Arousal/Alertness: Lethargic Behavior During Therapy:  WFL for tasks assessed/performed Overall Cognitive Status: Within Functional Limits for tasks assessed                     General Comments   Pt pleasant an cooperative                 Home Living Family/patient expects to be discharged to:: Private residence Living Arrangements: Parent Available Help at Discharge: Family;Available 24 hours/day Type of Home: House Home Access: Level entry     Home Layout: One level     Bathroom Shower/Tub: Occupational psychologist: Handicapped height     Home Equipment: Shower seat - built in;Cane - single point   Additional Comments:  handicap bathroom at [facility]      Prior Functioning/Environment  independent with assistive devices             OT Diagnosis: Generalized weakness;Acute pain   OT Problem List: Decreased strength;Decreased knowledge of use of DME or AE;Decreased activity tolerance;Pain;Impaired balance (sitting and/or standing)   OT Treatment/Interventions: Self-care/ADL training;Therapeutic exercise;Patient/family education;Neuromuscular education;Balance training;Therapeutic activities;DME and/or AE instruction    OT Goals(Current goals can be found in the care plan section) Acute Rehab OT Goals Patient Stated Goal: to go home today OT Goal Formulation: With patient/family Time For Goal Achievement: 01/31/14 Potential to Achieve Goals: Good ADL Goals Pt Will Perform Grooming: with min guard assist;with supervision;with set-up;standing Pt Will Perform Lower Body Bathing: with mod assist;with adaptive equipment Pt Will Perform Lower Body Dressing: with mod assist;with adaptive equipment;sit to/from stand Pt Will Transfer to Toilet: with supervision;with modified independence;ambulating;regular height toilet;grab bars (3 in 1 over toilet) Pt Will Perform Toileting - Clothing Manipulation and hygiene: with min assist;sit to/from stand Additional ADL Goal #1: pt will recall 3/3 back precautuions  OT Frequency: Min 2X/week   Barriers to D/C:    none                     End of Session    Activity Tolerance: Patient limited by pain Patient left: in bed;with call bell/phone within reach;with bed alarm set;with family/visitor present   Time: 4827-0786 OT Time Calculation (min): 26 min Charges:  OT General Charges $OT Visit: 1 Procedure OT Evaluation $Initial OT Evaluation Tier I: 1 Procedure OT Treatments $Therapeutic Activity: 8-22 mins G-Codes:    Britt Bottom 01/24/2014, 2:30 PM

## 2014-01-24 NOTE — Progress Notes (Signed)
Pt reporting of headache pain 9 on 0-10 scale; pt given prn percocet and valium but no relieve. Pt also c/o n/v and Dr. Jeral FruitBotero notified he ordered to give pt her prn morphine dose which has been modified. Morphine and zofran given. Pt instructed to lay flat with HOB at 0. Pt comfortable in bed with mother at bedside. Will continue to monitor pt quietly. Jamie MerlesP. Amo Kiondre Grenz RN.

## 2014-01-25 NOTE — Progress Notes (Signed)
Physical Therapy Treatment Patient Details Name: Jamie BryantMelinda K [name] MRN: [id number] DOB: Dec 24, 1970 Today's Date: 01/25/2014    History of Present Illness s/p L4L5 diskectomy secondary to herniated disc     PT Comments    Pt continues to progress slowly but is highly motivated to progress mobility and hopeful to D/C home today. Pt c/o nausea throughout night. C/o 8/10 back pain during session. Pt is at supervision level for mobility at this time. Will cont to follow as pt is in acute setting per POC.   Follow Up Recommendations  No PT follow up;Supervision/Assistance - 24 hour     Equipment Recommendations  Rolling walker with 5" wheels    Recommendations for Other Services OT consult     Precautions / Restrictions Precautions Precautions: Back Precaution Booklet Issued: Yes (comment) Precaution Comments: pt able to independently recall 1/3 back precautions; reviewed with pt Restrictions Weight Bearing Restrictions: No    Mobility  Bed Mobility Overal bed mobility: Modified Independent             General bed mobility comments: pt demo good log rolling technique; no physical (A) needed  Transfers Overall transfer level: Needs assistance Equipment used: Rolling walker (2 wheeled) Transfers: Sit to/from Stand Sit to Stand: Supervision         General transfer comment: cues for hadn placemen and safety with RW   Ambulation/Gait Ambulation/Gait assistance: Supervision Ambulation Distance (Feet): 140 Feet Assistive device: Rolling walker (2 wheeled) Gait Pattern/deviations: Step-through pattern;Decreased stride length;Narrow base of support Gait velocity: decreased due to pain Gait velocity interpretation: Below normal speed for age/gender General Gait Details: cues for upright posture and gt sequencing; pt continues to ambulate with short shuffled steps; guarded due to pain   Stairs            Wheelchair Mobility    Modified Rankin (Stroke Patients  Only)       Balance Overall balance assessment: Needs assistance Sitting-balance support: Feet supported;No upper extremity supported Sitting balance-Leahy Scale: Good     Standing balance support: During functional activity;Bilateral upper extremity supported;Single extremity supported Standing balance-Leahy Scale: Fair Standing balance comment: able to remove UEs from RW at times; no sway or LOB noted                    Cognition Arousal/Alertness: Awake/alert Behavior During Therapy: WFL for tasks assessed/performed Overall Cognitive Status: Within Functional Limits for tasks assessed       Memory: Decreased recall of precautions              Exercises General Exercises - Lower Extremity Ankle Circles/Pumps: AROM;Both;10 reps Long Arc Quad: AROM;Both;5 reps    General Comments        Pertinent Vitals/Pain 8/10; premedicated     Home Living                      Prior Function            PT Goals (current goals can now be found in the care plan section) Acute Rehab PT Goals Patient Stated Goal: to go home today PT Goal Formulation: With patient Time For Goal Achievement: 01/27/14 Potential to Achieve Goals: Good Progress towards PT goals: Progressing toward goals    Frequency  Min 5X/week    PT Plan Current plan remains appropriate    Co-evaluation             End of Session Equipment Utilized During Treatment: Gait belt  Activity Tolerance: Other (comment) (c/o nausea ) Patient left: in bed;with call bell/phone within reach;with bed alarm set     Time: [id number] PT Time Calculation (min): 24 min  Charges:  $Gait Training: 23-37 mins                    G Codes:      Donell SievertWest, Martyn Timme N , [place]  [phone]  01/25/2014, 8:41 AM

## 2014-01-25 NOTE — Progress Notes (Signed)
OT Cancellation Note  Patient Details Name: Jamie BryantMelinda K Moore MRN: [id number] DOB: 04-15-1971   Cancelled Treatment:    Reason Eval/Treat Not Completed: Patient's level of consciousness. Attempted to see pt x2 today, however pt asleep both times and unable to arouse fully for therapy. Will continue to follow-up with pt to progress towards OT goals.   Rae LipsMiller, Alain Deschene M [phone] 01/25/2014, 5:44 PM

## 2014-01-25 NOTE — Progress Notes (Signed)
Patient ID: [name], female   DOB: December 30, 1970, 43 y.o.   MRN: [id number] Afeb,vss No new neuro issues. Does not feel well today, and has a headache that is not particularly positional. Will keep another day, and see if she feels better tomorrow.

## 2014-01-26 NOTE — Discharge Summary (Signed)
Physician Discharge Summary  Patient ID: Jamie BryantMelinda K [name] MRN: [id number] DOB/AGE: 1970/08/29 43 y.o.  Admit date: 01/22/2014 Discharge date: 01/26/2014  Admission Diagnoses:recurrent lumbar HNP  Discharge Diagnoses:  Active Problems:   Lumbar herniated disc HIPOTENSION  Discharged Condition:NO LEG PAIN  Hospital Course: surgery  Consults:NONE  Significant Diagnostic Studies: mri  Treatments: discectomy. Repair duratotomy  Discharge Exam: Blood pressure 104/73, pulse 97, temperature 97.3 F (36.3 C), temperature source Oral, resp. rate 20, height 5\' 3"  (1.6 m), last menstrual period 01/07/2014, SpO2 99.00%. Ambulating.  Disposition: to see me in 10 days    Medication List    ASK your doctor about these medications       acetaminophen 500 MG tablet  Commonly known as:  TYLENOL  Take 1,000 mg by mouth every 6 (six) hours as needed for mild pain or moderate pain.     buPROPion 300 MG 24 hr tablet  Commonly known as:  WELLBUTRIN XL  Take 1 tablet (300 mg total) by mouth daily.     carbamazepine 200 MG tablet  Commonly known as:  TEGRETOL  Take 200 mg by mouth at bedtime.     clonazePAM 1 MG tablet  Commonly known as:  KLONOPIN  Take 1 tablet (1 mg total) by mouth 3 (three) times daily.     DULoxetine 60 MG capsule  Commonly known as:  CYMBALTA  Take 2 capsules (120 mg total) by mouth daily.     lisinopril 20 MG tablet  Commonly known as:  PRINIVIL,ZESTRIL  Take 20 mg by mouth daily.     metformin 500 MG (OSM) 24 hr tablet  Commonly known as:  FORTAMET  Take 1,500 mg by mouth daily with breakfast.     omeprazole 20 MG capsule  Commonly known as:  PRILOSEC  Take 1 capsule (20 mg total) by mouth daily.     propranolol 40 MG tablet  Commonly known as:  INDERAL  Take 40 mg by mouth 3 (three) times daily.     rosuvastatin 20 MG tablet  Commonly known as:  CRESTOR  Take 20 mg by mouth daily.     topiramate 25 MG capsule  Commonly known as:  TOPAMAX  Take  2 capsules (50 mg total) by mouth 2 (two) times daily.     traZODone 100 MG tablet  Commonly known as:  DESYREL  Take 2 tablets (200 mg total) by mouth at bedtime.     ziprasidone 80 MG capsule  Commonly known as:  GEODON  Take 160 mg by mouth 2 (two) times daily with a meal.         Signed: Moxie Kalil M 01/26/2014, 9:34 AM

## 2014-01-26 NOTE — Progress Notes (Signed)
Occupational Therapy Treatment Patient Details Name: Jamie [name] MRN: [id number] DOB: 1971-01-10 Today's Date: 01/26/2014    History of present illness Jamie [name] is a 43 y.o. Female s/p L4-L5 discectomy secondary to herniated disc on 01/22/14.   OT comments  Pt seen today for ADL session and functional mobility. Pt is moving slow and requires Supervision and verbal cues to maintain back precautions. Pt is motivated to go home today. Educated pt on incorporating back precautions into ADLs and fall prevention strategies.    Follow Up Recommendations  No OT follow up;Supervision/Assistance - 24 hour    Equipment Recommendations  3 in 1 bedside comode;None recommended by OT;Other (comment) (AE (hip kit))       Precautions / Restrictions Precautions Precautions: Back Precaution Comments: pt able to recall 2/3 back precautions. Educated pt on back precautions and incorporating into ADLs.  Restrictions Weight Bearing Restrictions: No       Mobility Bed Mobility Overal bed mobility: Needs Assistance Bed Mobility: Rolling;Sidelying to Sit;Sit to Sidelying Rolling: Supervision Sidelying to sit: Min assist (for truncal support off bed)     Sit to sidelying: Supervision General bed mobility comments: Pt attempted to get OOB using flat spin method, however pt was close to breaking back precautions (twisting) and verbally walked pt through sequencing of log roll. Pt requires min (A) for truncal support off bed to achieve sitting position.  Transfers Overall transfer level: Needs assistance Equipment used: Rolling walker (2 wheeled) Transfers: Sit to/from Stand Sit to Stand: Supervision         General transfer comment: VC's for hand placement and safety with RW        ADL Overall ADL's : Needs assistance/impaired     Grooming: Supervision/safety;Standing                   Toilet Transfer: Supervision/safety;Ambulation;BSC;RW Toilet Transfer Details  (indicate cue type and reason): VC's for hand placement and safety.  Toileting- Clothing Manipulation and Hygiene: Supervision/safety;Sit to/from stand Toileting - Clothing Manipulation Details (indicate cue type and reason): Pt able to perform hygiene while maintaining back precautions. Educated pt on use of tongs and baby wipes to increase access for toilet hygiene.       General ADL Comments: Pt moves slowly and requires multiple VC's for hand placement. Educated pt with demonstration on correct transfers and safety with DME. Pt required VC's throughout session to maintain back precautions.                Cognition  Arousal/Alertness: Awake/Alert Behavior During Therapy: Flat affect Overall Cognitive Status: Within Functional Limits for tasks assessed       Memory: Decreased recall of precautions                            Pertinent Vitals/ Pain       NAD; SPO2 95% (off oxygen, during activity), HR 100bpm during activity         Frequency Min 2X/week     Progress Toward Goals  OT Goals(current goals can now be found in the care plan section)  Progress towards OT goals: Progressing toward goals  Acute Rehab OT Goals Patient Stated Goal: to go home today  Plan Discharge plan remains appropriate       End of Session Equipment Utilized During Treatment: Rolling walker   Activity Tolerance Patient tolerated treatment well   Patient Left in bed;with call bell/phone within reach;with family/visitor present  Time: 2505-3976 OT Time Calculation (min): 23 min  Charges: OT General Charges $OT Visit: 1 Procedure OT Treatments $Self Care/Home Management : 23-37 mins  Juluis Rainier [phone] 01/26/2014, 9:57 AM

## 2014-01-26 NOTE — Progress Notes (Signed)
Wanted to D/C patient, but AVS will not print, med rec not completed. MD'S office notified.

## 2014-01-26 NOTE — Progress Notes (Signed)
Patient d/c home, discharge instruction given. Patient verbalized understanding.

## 2014-01-26 NOTE — Progress Notes (Signed)
AVS still not able to print, MD'S office called again for him to fix it. Will let patient know.

## 2014-02-01 DIAGNOSIS — I10 Essential (primary) hypertension: Secondary | ICD-10-CM | POA: Insufficient documentation

## 2014-02-20 ENCOUNTER — Ambulatory Visit (INDEPENDENT_AMBULATORY_CARE_PROVIDER_SITE_OTHER): Payer: Medicare Other | Admitting: Psychiatry

## 2014-02-20 VITALS — BP 125/91 | HR 109 | Ht 63.0 in | Wt 228.8 lb

## 2014-02-20 DIAGNOSIS — F323 Major depressive disorder, single episode, severe with psychotic features: Secondary | ICD-10-CM

## 2014-02-20 MED ORDER — CLONAZEPAM 1 MG PO TABS: 1.0000 mg | ORAL_TABLET | Freq: Three times a day (TID) | ORAL | Status: DC

## 2014-02-20 MED ORDER — BUPROPION HCL ER (XL) 300 MG PO TB24: 300.0000 mg | ORAL_TABLET | Freq: Every day | ORAL | Status: DC

## 2014-02-20 MED ORDER — TRAZODONE HCL 100 MG PO TABS: 200.0000 mg | ORAL_TABLET | Freq: Every day | ORAL | Status: DC

## 2014-02-20 MED ORDER — DULOXETINE HCL 60 MG PO CPEP: 120.0000 mg | ORAL_CAPSULE | Freq: Every day | ORAL | Status: DC

## 2014-02-20 MED ORDER — ZIPRASIDONE HCL 80 MG PO CAPS: 160.0000 mg | ORAL_CAPSULE | Freq: Two times a day (BID) | ORAL | Status: DC

## 2014-02-20 NOTE — Progress Notes (Signed)
[facility]  02/20/2014 2:05 PM Jefm BryantMelinda K Moore  MRN:  [id number] Subjective: Fair Today the patient actually shows significant resilience. Since I've seen her her father died. This is expected. He had end-stage heart disease. The patient moved in with her mom is living with her for now. The patient ultimately me back to her own home. Her mother actually is doing better. In fact the patient says that while she was very depressed when her dad died her mood has improved. At this time she sleeping and eating well. She gets good energy. She virtually still has a close friend who has metastatic disease is given 6 months to live. It should be noted the patient was hospitalized just year ago in a psychiatric unit. Therefore she's tolerated significant stresses. This also includes that last month she had back surgery. She was hospitalized medically for 4 days and is getting physical therapy at this time. The patient is able to concentrate well and has a good sense of worth. She denies being suicidal now. The patient has no evidence of psychosis. The patient seems to be surviving well. The patient is taking her medicines as prescribed. Today we reviewed all her medications in the pros and cons of each agent. As planned today we will discontinue her Tegretol. The patient will continue on many psychiatric medications.  Diagnosis:   DSM5: Schizophrenia Disorders:   Obsessive-Compulsive Disorders:   Trauma-Stressor Disorders:   Substance/Addictive Disorders:   Depressive Disorders:  Major Depressive Disorder - with Psychotic Features (296.24) Total Time spent with patient: 45 minutes  Axis I: Major Depression, Recurrent severe  ADL's:  Intact  Sleep: Good  Appetite:  Good  Suicidal Ideation:  no Homicidal Ideation:  none AEB (as evidenced by):  Psychiatric Specialty Exam: Physical Exam  ROS  Last menstrual period 01/07/2014.There is no weight on file to calculate BMI.  General Appearance:  Casual  Eye Contact::  Good  Speech:  Clear and Coherent  Volume:  Normal  Mood:  Depressed and Dysphoric  Affect:  Appropriate  Thought Process:  Coherent  Orientation:  Full (Time, Place, and Person)  Thought Content:  NA  Suicidal Thoughts:  No  Homicidal Thoughts:  No  Memory:  NA  Judgement:  Good  Insight:  Good  Psychomotor Activity:  Normal  Concentration:  Good  Recall:  Good  Fund of Knowledge:Good  Language: Good  Akathisia:  No  Handed:  Right  AIMS (if indicated):     Assets:  Communication Skills  Sleep:      Musculoskeletal: Strength & Muscle Tone:  Gait & Station:  Patient leans:   Current Medications: Current Outpatient Prescriptions  Medication Sig Dispense Refill  . acetaminophen (TYLENOL) 500 MG tablet Take 1,000 mg by mouth every 6 (six) hours as needed for mild pain or moderate pain.      [name] Kitchen. buPROPion (WELLBUTRIN XL) 300 MG 24 hr tablet Take 1 tablet (300 mg total) by mouth daily.  30 tablet  5  . carbamazepine (TEGRETOL) 200 MG tablet Take 200 mg by mouth at bedtime.      . clonazePAM (KLONOPIN) 1 MG tablet Take 1 tablet (1 mg total) by mouth 3 (three) times daily.  90 tablet  4  . DULoxetine (CYMBALTA) 60 MG capsule Take 2 capsules (120 mg total) by mouth daily.  60 capsule  5  . lisinopril (PRINIVIL,ZESTRIL) 20 MG tablet Take 20 mg by mouth daily.        . metformin (  FORTAMET) 500 MG (OSM) 24 hr tablet Take 1,500 mg by mouth daily with breakfast.      . omeprazole (PRILOSEC) 20 MG capsule Take 1 capsule (20 mg total) by mouth daily.  30 capsule  0  . propranolol (INDERAL) 40 MG tablet Take 40 mg by mouth 3 (three) times daily.        . rosuvastatin (CRESTOR) 20 MG tablet Take 20 mg by mouth daily.        [name] Kitchen topiramate (TOPAMAX) 25 MG capsule Take 2 capsules (50 mg total) by mouth 2 (two) times daily.  120 capsule  11  . traZODone (DESYREL) 100 MG tablet Take 2 tablets (200 mg total) by mouth at bedtime.  60 tablet  5  . ziprasidone (GEODON) 80 MG  capsule Take 2 capsules (160 mg total) by mouth 2 (two) times daily with a meal.  60 capsule  6   No current facility-administered medications for this visit.    Lab Results: No results found for this or any previous visit (from the past 48 hour(s)).  Physical Findings: AIMS:  , ,  ,  ,    CIWA:    COWS:     Treatment Plan Summary: This patient will continue in therapy with her community therapist. Doreatha Lew continue taking all her medications which included Klonopin 1 mg 3 times a day, Cymbalta 60 mg 2 at night, trazodone 200 mg, Geodon 80 mg twice a day and Wellbutrin 300 mg each morning. The patient is not suicidal. She is very engaging. This patient return to see me in approximately 2 and half months.  Plan:  Medical Decision Making Problem Points:  Established problem, stable/improving (1) Data Points:  Review of medication regiment & side effects (2)  I certify that inpatient services furnished can reasonably be expected to improve the patient's condition.   Senai Ramnath IRVING 02/20/2014, 2:05 PM

## 2014-02-22 ENCOUNTER — Other Ambulatory Visit (HOSPITAL_COMMUNITY): Payer: Self-pay | Admitting: *Deleted

## 2014-02-22 MED ORDER — ZIPRASIDONE HCL 80 MG PO CAPS: 80.0000 mg | ORAL_CAPSULE | Freq: Two times a day (BID) | ORAL | Status: DC

## 2014-02-22 NOTE — Telephone Encounter (Signed)
02/20/14:Received faxed request for PA for Ziprasidone 80 mg, TWO capsules BID, #60, 15 day supply, Reviewed chart, Dr. Donell BeersPlovsky note from 02/20/14-- "Geodon 80 mg twice a day" 02/22/14:Confirmed with Dr.Plovsky that RX - Ziprasidone 80 mg, TWO capsules BID, #60, 15 day supply was ordered in error Per Dr.Plovsky's orders, send new RX for  Ziprasidone 80 mg, ONE capsules BID, #60, 30 day supply

## 2014-03-29 ENCOUNTER — Telehealth: Payer: Self-pay | Admitting: Nurse Practitioner

## 2014-03-29 ENCOUNTER — Ambulatory Visit: Payer: Medicare Other | Admitting: Nurse Practitioner

## 2014-03-29 NOTE — Telephone Encounter (Signed)
No show for scheduled appointment 

## 2014-04-09 DIAGNOSIS — R7309 Other abnormal glucose: Secondary | ICD-10-CM | POA: Diagnosis not present

## 2014-04-09 DIAGNOSIS — I10 Essential (primary) hypertension: Secondary | ICD-10-CM | POA: Diagnosis not present

## 2014-04-09 DIAGNOSIS — K219 Gastro-esophageal reflux disease without esophagitis: Secondary | ICD-10-CM | POA: Diagnosis not present

## 2014-04-09 DIAGNOSIS — E781 Pure hyperglyceridemia: Secondary | ICD-10-CM | POA: Diagnosis not present

## 2014-04-23 DIAGNOSIS — I10 Essential (primary) hypertension: Secondary | ICD-10-CM | POA: Diagnosis not present

## 2014-04-23 DIAGNOSIS — E781 Pure hyperglyceridemia: Secondary | ICD-10-CM | POA: Diagnosis not present

## 2014-04-23 DIAGNOSIS — M545 Low back pain, unspecified: Secondary | ICD-10-CM | POA: Diagnosis not present

## 2014-04-23 DIAGNOSIS — Z23 Encounter for immunization: Secondary | ICD-10-CM | POA: Diagnosis not present

## 2014-04-23 DIAGNOSIS — R7309 Other abnormal glucose: Secondary | ICD-10-CM | POA: Diagnosis not present

## 2014-04-23 DIAGNOSIS — E282 Polycystic ovarian syndrome: Secondary | ICD-10-CM | POA: Diagnosis not present

## 2014-04-23 DIAGNOSIS — K219 Gastro-esophageal reflux disease without esophagitis: Secondary | ICD-10-CM | POA: Diagnosis not present

## 2014-04-29 DIAGNOSIS — Z1231 Encounter for screening mammogram for malignant neoplasm of breast: Secondary | ICD-10-CM | POA: Diagnosis not present

## 2014-05-01 ENCOUNTER — Encounter: Payer: Self-pay | Admitting: Nurse Practitioner

## 2014-05-01 ENCOUNTER — Encounter (INDEPENDENT_AMBULATORY_CARE_PROVIDER_SITE_OTHER): Payer: Self-pay

## 2014-05-01 ENCOUNTER — Ambulatory Visit (INDEPENDENT_AMBULATORY_CARE_PROVIDER_SITE_OTHER): Payer: Medicare Other | Admitting: Psychiatry

## 2014-05-01 ENCOUNTER — Ambulatory Visit (INDEPENDENT_AMBULATORY_CARE_PROVIDER_SITE_OTHER): Payer: Medicare Other | Admitting: Nurse Practitioner

## 2014-05-01 VITALS — BP 106/72 | HR 100 | Ht 63.0 in | Wt 239.4 lb

## 2014-05-01 VITALS — BP 103/72 | HR 94 | Ht 63.0 in | Wt 237.8 lb

## 2014-05-01 DIAGNOSIS — F411 Generalized anxiety disorder: Secondary | ICD-10-CM

## 2014-05-01 DIAGNOSIS — F319 Bipolar disorder, unspecified: Secondary | ICD-10-CM | POA: Diagnosis not present

## 2014-05-01 DIAGNOSIS — R51 Headache: Secondary | ICD-10-CM | POA: Diagnosis not present

## 2014-05-01 DIAGNOSIS — F3132 Bipolar disorder, current episode depressed, moderate: Secondary | ICD-10-CM

## 2014-05-01 DIAGNOSIS — R519 Headache, unspecified: Secondary | ICD-10-CM

## 2014-05-01 MED ORDER — DULOXETINE HCL 60 MG PO CPEP: 120.0000 mg | ORAL_CAPSULE | Freq: Every day | ORAL | Status: DC

## 2014-05-01 MED ORDER — ZIPRASIDONE HCL 80 MG PO CAPS
80.0000 mg | ORAL_CAPSULE | Freq: Two times a day (BID) | ORAL | Status: DC
Start: 2014-05-01 — End: 2014-08-07

## 2014-05-01 MED ORDER — TOPIRAMATE 50 MG PO TABS: 50.0000 mg | ORAL_TABLET | Freq: Two times a day (BID) | ORAL | Status: DC

## 2014-05-01 MED ORDER — TRAZODONE HCL 100 MG PO TABS: 200.0000 mg | ORAL_TABLET | Freq: Every day | ORAL | Status: DC

## 2014-05-01 MED ORDER — BUPROPION HCL ER (XL) 300 MG PO TB24: 300.0000 mg | ORAL_TABLET | Freq: Every day | ORAL | Status: DC

## 2014-05-01 MED ORDER — CLONAZEPAM 1 MG PO TABS: 1.0000 mg | ORAL_TABLET | Freq: Three times a day (TID) | ORAL | Status: DC

## 2014-05-01 NOTE — Patient Instructions (Signed)
Continue Topamax 50 mg twice daily Will refill Call if  headaches worsen Followup when necessary

## 2014-05-01 NOTE — Progress Notes (Signed)
[facility]  05/01/2014 3:18 PM Jamie BryantMelinda K [name]  MRN:  [id number] Subjective:  Great Today the patient is doing quite well. She's done well grieving the death of her father. She no longer is with her mother and now is back to her own home. She does check her mother regularly. The patient is not dating. The patient's good friend with metastatic cancer is actually stable at this time. The patient denies depression. She is sleeping and eating well. She's been good energy. She enjoys her 2 dogs 3 cats and quilting. The patient reads watches TV and enjoys life. She denies worthlessness. She denies suicidal thinking. The patient denies any psychotic symptoms at this time. She denies the use of alcohol or drugs. The patient is financially stable. Her current home or paid for. The patient is on so security disability for a psychotic disorder most likely that of schizoaffective disorder. The patient feels calm and stable. The patient does not have diabetes but she does have cholesterol which is well controlled. It is noted the patient has been hospitalized psychiatrically 6 times. The last time was in the summer of 2013. In essence she's been out of the hospital over a year and a half. In the previous years before that she been hospitalized almost a half a dozen times over a two-year period. Therefore this patient seems to have her chronic severe mental illness but actually at this time is doing very well. We reviewed all her medications. All her medicines seem reasonable but we discussed the possibility one day perhaps reducing her Geodon. The patient had aims scale which was negative. The patient is not oversedated. The patient takes Cymbalta at a high dose for her back pain. The patient recently recovered from some back surgery. She's doing very well with this. At this time the patient denies shortness of breath or chest pain or any neurological symptomatology. Diagnosis:   DSM5: Schizophrenia Disorders:    Obsessive-Compulsive Disorders:   Trauma-Stressor Disorders:   Substance/Addictive Disorders:   Depressive Disorders:   Total Time spent with patient:   Axis I: Bipolar, Depressed  ADL's:  Intact  Sleep: Good  Appetite:  Good  Suicidal Ideation:  no Homicidal Ideation:  none AEB (as evidenced by):  Psychiatric Specialty Exam: Physical Exam  ROS  Blood pressure 106/72, pulse 100, height 5\' 3"  (1.6 m), weight 239 lb 6.4 oz (108.591 kg).Body mass index is 42.42 kg/(m^2).  General Appearance: Casual  Eye Contact::  Good  Speech:  Clear and Coherent  Volume:  Normal  Mood:  NA  Affect:  Appropriate  Thought Process:  Coherent  Orientation:  Full (Time, Place, and Person)  Thought Content:  WDL  Suicidal Thoughts:  No  Homicidal Thoughts:  No  Memory:  NA  Judgement:  Good  Insight:  Good  Psychomotor Activity:  Normal  Concentration:  Good  Recall:  Good  Fund of Knowledge:Good  Language: Good  Akathisia:  No  Handed:  Right  AIMS (if indicated):     Assets:  Communication Skills  Sleep:      Musculoskeletal: Strength & Muscle Tone:  Gait & Station:  Patient leans:   Current Medications: Current Outpatient Prescriptions  Medication Sig Dispense Refill  . acetaminophen (TYLENOL) 500 MG tablet Take 1,000 mg by mouth every 6 (six) hours as needed for mild pain or moderate pain.      [name] Kitchen. buPROPion (WELLBUTRIN XL) 300 MG 24 hr tablet Take 1 tablet (300 mg total) by  mouth daily.  30 tablet  5  . clonazePAM (KLONOPIN) 1 MG tablet Take 1 tablet (1 mg total) by mouth 3 (three) times daily.  90 tablet  5  . DULoxetine (CYMBALTA) 60 MG capsule Take 2 capsules (120 mg total) by mouth daily.  60 capsule  5  . lisinopril (PRINIVIL,ZESTRIL) 5 MG tablet Take 5 mg by mouth daily.      . metformin (FORTAMET) 500 MG (OSM) 24 hr tablet Take 1,500 mg by mouth daily with breakfast.      . omeprazole (PRILOSEC) 20 MG capsule Take 1 capsule (20 mg total) by mouth daily.  30 capsule   0  . rosuvastatin (CRESTOR) 20 MG tablet Take 20 mg by mouth daily.        [name] Kitchen topiramate (TOPAMAX) 50 MG tablet Take 1 tablet (50 mg total) by mouth 2 (two) times daily.  60 tablet  11  . traZODone (DESYREL) 100 MG tablet Take 2 tablets (200 mg total) by mouth at bedtime.  60 tablet  5  . ziprasidone (GEODON) 80 MG capsule Take 1 capsule (80 mg total) by mouth 2 (two) times daily with a meal.  60 capsule  6   No current facility-administered medications for this visit.    Lab Results: No results found for this or any previous visit (from the past 48 hour(s)).  Physical Findings: AIMS:  , ,  ,  ,    CIWA:    COWS:     Treatment Plan Summary: At this time this patient will continue taking all her medications. This includes Klonopin 1 mg 3 times a day, Cymbalta 60 mg 2 at night, trazodone 200 mg at night Geodon 80 mg twice a day and Wellbutrin 300 mg each morning. The patient is very stable at this time. She'll return to see me in 3 months.  Plan:  Medical Decision Making Problem Points:  Established problem, stable/improving (1) Data Points:  Review of medication regiment & side effects (2)  I certify that inpatient services furnished can reasonably be expected to improve the patient's condition.   Jamie [name] 05/01/2014, 3:18 PM

## 2014-05-01 NOTE — Progress Notes (Signed)
[facility]  PATIENT: Jamie [name] DOB: [birth year]   REASON FOR VISIT: followup for headache    HISTORY OF PRESENT ILLNESS: Ms. Jamie [name], 43 year old female returns for followup. She has a history of headaches. She was last seen in this office 12/06/2012 her headaches are in fairly good control. She is currently taking Topamax 50 twice a day. She has significant bipolar disorder.She has maybe 2 headaches a month or less. She also has a past medical history of hypertension, diabetes, depression and anxiety treated by Dr. Donell Beers, hyperlipidemia, chronic low back pain, lumbar laminectomy in June of this year by Dr. Jeral Fruit. She also has a history of polycystic disease and obesity. She denies any motor or sensory deficits. MRI and MRV of the brain in the past have been normal. She has no new neurologic complaints. She returns for refill and reevaluation      REVIEW OF SYSTEMS: Full 14 system review of systems performed and notable only for those listed, all others are neg:  Constitutional: [name]  Cardiovascular: [name]  Ear/Nose/Throat: [name]  Skin: [name]  Eyes: [name]  Respiratory: [name]  Gastroitestinal: [name]  Hematology/Lymphatic: [name]  Endocrine: [name] Musculoskeletal: Back pain Allergy/Immunology: [name]  Neurological: [name] Psychiatric: Depression anxiety Sleep : NA   ALLERGIES: Allergies  Allergen Reactions  . Codeine Hives and Swelling  . Morphine And Related Swelling and Rash    7/2: Pt state is OK in small doses and is willing to take Morphine this admission    HOME MEDICATIONS: Outpatient Prescriptions Prior to Visit  Medication Sig Dispense Refill  . acetaminophen (TYLENOL) 500 MG tablet Take 1,000 mg by mouth every 6 (six) hours as needed for mild pain or moderate pain.      [name] Kitchen buPROPion (WELLBUTRIN XL) 300 MG 24 hr tablet Take 1 tablet (300 mg total) by mouth daily.  30 tablet  5  . clonazePAM (KLONOPIN) 1 MG tablet Take 1 tablet (1 mg total) by mouth 3 (three)  times daily.  90 tablet  4  . DULoxetine (CYMBALTA) 60 MG capsule Take 2 capsules (120 mg total) by mouth daily.  60 capsule  5  . metformin (FORTAMET) 500 MG (OSM) 24 hr tablet Take 1,500 mg by mouth daily with breakfast.      . omeprazole (PRILOSEC) 20 MG capsule Take 1 capsule (20 mg total) by mouth daily.  30 capsule  0  . rosuvastatin (CRESTOR) 20 MG tablet Take 20 mg by mouth daily.        [name] Kitchen topiramate (TOPAMAX) 25 MG capsule Take 2 capsules (50 mg total) by mouth 2 (two) times daily.  120 capsule  11  . traZODone (DESYREL) 100 MG tablet Take 2 tablets (200 mg total) by mouth at bedtime.  60 tablet  5  . ziprasidone (GEODON) 80 MG capsule Take 1 capsule (80 mg total) by mouth 2 (two) times daily with a meal.  60 capsule  6  . carbamazepine (TEGRETOL) 200 MG tablet Take 200 mg by mouth at bedtime.      [name] Kitchen lisinopril (PRINIVIL,ZESTRIL) 20 MG tablet Take 20 mg by mouth daily.       . propranolol (INDERAL) 40 MG tablet Take 40 mg by mouth 3 (three) times daily.         No facility-administered medications prior to visit.    PAST MEDICAL HISTORY: Past Medical History  Diagnosis Date  . Hypertension   . High cholesterol   . Bipolar 1 disorder   . Depression   .  Anxiety   . Headache(784.0)   . Complication of anesthesia   . PONV (postoperative nausea and vomiting)     nausea  . PCOS (polycystic ovarian syndrome)   . GERD (gastroesophageal reflux disease)   . H/O hiatal hernia     PAST SURGICAL HISTORY: Past Surgical History  Procedure Laterality Date  . Back surgery  2009  . Cholecystectomy  07/2002  . Anal fissure repair      x 3  . Knee arthroscopy Right   . Lumbar laminectomy/decompression microdiscectomy Right 01/22/2014    Procedure: LUMBAR FOUR TO FIVE LUMBAR LAMINECTOMY/DECOMPRESSION MICRODISCECTOMY 1 LEVEL;  Surgeon: Karn CassisErnesto M Botero, MD;  Location: [facility];  Service: Neurosurgery;  Laterality: Right;  Right L45 diskectomy    FAMILY HISTORY: Family History    Problem Relation Age of Onset  . Family history unknown: Yes    SOCIAL HISTORY: History   Social History  . Marital Status: Single    Spouse Name: [name]    Number of Children: 0  . Years of Education: college   Occupational History  . disabled    Social History Main Topics  . Smoking status: Never Smoker   . Smokeless tobacco: Never Used  . Alcohol Use: No  . Drug Use: No  . Sexual Activity: No   Other Topics Concern  . Not on file   Social History Narrative   Patient lives at home alone    Patient is right handed   Patient drinks soda's daily     PHYSICAL EXAM  Filed Vitals:   05/01/14 0844  BP: 103/72  Pulse: 94  Height: 5\' 3"  (1.6 m)  Weight: 237 lb 12.8 oz (107.865 kg)   Body mass index is 42.13 kg/(m^2). General: well developed, obese female seated, in no evident distress  Head: head normocephalic and atraumatic. Oropharynx benign  Neck: supple with no carotid or supraclavicular bruits  Cardiovascular: regular rate and rhythm, no murmurs  Neurologic Exam  Mental Status: Awake and fully alert. Oriented to place and time. Follows all commands, speech and language are normal  Cranial Nerves:  Pupils equal, briskly reactive to light. Extraocular movements full without nystagmus. Visual fields full to confrontation. Hearing intact and symmetric to finger snap. Facial sensation intact. Face, tongue, palate move normally and symmetrically. Neck flexion and extension normal.  Motor: Normal bulk and tone. Normal strength in all tested extremity muscles. No focal weakness  Sensory.: intact to touch and pinprick and vibratory.  Coordination: Rapid alternating movements normal in all extremities. Finger-to-nose and heel-to-shin performed accurately bilaterally.  Gait and Station: Arises from chair without difficulty. Stance is normal. Gait demonstrates normal stride length and balance . Able to heel, toe and tandem walk without difficulty.  Reflexes: 2+ and symmetric.  Toes downgoing.     DIAGNOSTIC DATA (LABS, IMAGING, TESTING) - I reviewed patient records, labs, notes, testing and imaging myself where available.  Lab Results  Component Value Date   WBC 8.1 01/23/2014   HGB 9.7* 01/23/2014   HCT 31.0* 01/23/2014   MCV 92.8 01/23/2014   PLT 155 01/23/2014      Component Value Date/Time   NA 140 01/21/2014 1415   K 4.5 01/21/2014 1415   CL 100 01/21/2014 1415   CO2 24 01/21/2014 1415   GLUCOSE 96 01/21/2014 1415   BUN 17 01/21/2014 1415   CREATININE 0.72 01/21/2014 1415   CALCIUM 9.6 01/21/2014 1415   PROT 7.0 06/07/2011 2324   ALBUMIN 3.8 06/07/2011 2324  AST 14 06/07/2011 2324   ALT 10 06/07/2011 2324   ALKPHOS 52 06/07/2011 2324   BILITOT 0.1* 06/07/2011 2324   GFRNONAA >90 01/21/2014 1415   GFRAA >90 01/21/2014 1415    ASSESSMENT AND PLAN  43 y.o. year old female  has a past medical history of Hypertension; High cholesterol; Bipolar 1 disorder; Depression; Anxiety; Headache(784.0); ; PCOS (polycystic ovarian syndrome); GERD (gastroesophageal reflux disease); and H/O hiatal hernia. here to followup for her headaches.  Continue Topamax 50 mg twice daily Will refill Call if  headaches worsen Followup when necessary Nilda Riggs, [facility], [facility], APRN  [facility] [address], [address] [place], [place] [address] [phone]

## 2014-06-10 DIAGNOSIS — M25572 Pain in left ankle and joints of left foot: Secondary | ICD-10-CM | POA: Diagnosis not present

## 2014-06-10 DIAGNOSIS — S298XXA Other specified injuries of thorax, initial encounter: Secondary | ICD-10-CM | POA: Diagnosis not present

## 2014-06-10 DIAGNOSIS — S90812A Abrasion, left foot, initial encounter: Secondary | ICD-10-CM | POA: Diagnosis not present

## 2014-06-10 DIAGNOSIS — E119 Type 2 diabetes mellitus without complications: Secondary | ICD-10-CM | POA: Diagnosis not present

## 2014-06-10 DIAGNOSIS — S99912A Unspecified injury of left ankle, initial encounter: Secondary | ICD-10-CM | POA: Diagnosis not present

## 2014-06-10 DIAGNOSIS — S2220XA Unspecified fracture of sternum, initial encounter for closed fracture: Secondary | ICD-10-CM | POA: Diagnosis not present

## 2014-06-10 DIAGNOSIS — S8991XA Unspecified injury of right lower leg, initial encounter: Secondary | ICD-10-CM | POA: Diagnosis not present

## 2014-06-10 DIAGNOSIS — Z79899 Other long term (current) drug therapy: Secondary | ICD-10-CM | POA: Diagnosis not present

## 2014-06-10 DIAGNOSIS — M25562 Pain in left knee: Secondary | ICD-10-CM | POA: Diagnosis not present

## 2014-06-10 DIAGNOSIS — F418 Other specified anxiety disorders: Secondary | ICD-10-CM | POA: Diagnosis not present

## 2014-06-10 DIAGNOSIS — S8992XA Unspecified injury of left lower leg, initial encounter: Secondary | ICD-10-CM | POA: Diagnosis not present

## 2014-06-10 DIAGNOSIS — M25561 Pain in right knee: Secondary | ICD-10-CM | POA: Diagnosis not present

## 2014-06-10 DIAGNOSIS — I10 Essential (primary) hypertension: Secondary | ICD-10-CM | POA: Diagnosis not present

## 2014-06-10 DIAGNOSIS — Z8249 Family history of ischemic heart disease and other diseases of the circulatory system: Secondary | ICD-10-CM | POA: Diagnosis not present

## 2014-06-10 DIAGNOSIS — M549 Dorsalgia, unspecified: Secondary | ICD-10-CM | POA: Diagnosis not present

## 2014-06-10 DIAGNOSIS — S299XXA Unspecified injury of thorax, initial encounter: Secondary | ICD-10-CM | POA: Diagnosis not present

## 2014-06-10 DIAGNOSIS — Z885 Allergy status to narcotic agent status: Secondary | ICD-10-CM | POA: Diagnosis not present

## 2014-06-10 DIAGNOSIS — Z836 Family history of other diseases of the respiratory system: Secondary | ICD-10-CM | POA: Diagnosis not present

## 2014-06-10 DIAGNOSIS — E780 Pure hypercholesterolemia: Secondary | ICD-10-CM | POA: Diagnosis not present

## 2014-06-10 DIAGNOSIS — S3991XA Unspecified injury of abdomen, initial encounter: Secondary | ICD-10-CM | POA: Diagnosis not present

## 2014-06-10 DIAGNOSIS — M545 Low back pain: Secondary | ICD-10-CM | POA: Diagnosis not present

## 2014-06-10 DIAGNOSIS — S3630XA Unspecified injury of stomach, initial encounter: Secondary | ICD-10-CM | POA: Diagnosis not present

## 2014-06-10 DIAGNOSIS — S99922A Unspecified injury of left foot, initial encounter: Secondary | ICD-10-CM | POA: Diagnosis not present

## 2014-06-10 DIAGNOSIS — M546 Pain in thoracic spine: Secondary | ICD-10-CM | POA: Diagnosis not present

## 2014-06-10 DIAGNOSIS — F319 Bipolar disorder, unspecified: Secondary | ICD-10-CM | POA: Diagnosis not present

## 2014-06-10 DIAGNOSIS — E282 Polycystic ovarian syndrome: Secondary | ICD-10-CM | POA: Diagnosis not present

## 2014-06-10 DIAGNOSIS — Z9889 Other specified postprocedural states: Secondary | ICD-10-CM | POA: Diagnosis not present

## 2014-06-26 DIAGNOSIS — S2220XD Unspecified fracture of sternum, subsequent encounter for fracture with routine healing: Secondary | ICD-10-CM | POA: Diagnosis not present

## 2014-08-01 DIAGNOSIS — M545 Low back pain: Secondary | ICD-10-CM | POA: Diagnosis not present

## 2014-08-01 DIAGNOSIS — Z6841 Body Mass Index (BMI) 40.0 and over, adult: Secondary | ICD-10-CM | POA: Diagnosis not present

## 2014-08-07 ENCOUNTER — Ambulatory Visit (INDEPENDENT_AMBULATORY_CARE_PROVIDER_SITE_OTHER): Payer: Medicare Other | Admitting: Psychiatry

## 2014-08-07 VITALS — BP 120/75 | HR 124 | Ht 63.0 in | Wt 242.6 lb

## 2014-08-07 DIAGNOSIS — F3162 Bipolar disorder, current episode mixed, moderate: Secondary | ICD-10-CM

## 2014-08-07 DIAGNOSIS — F3130 Bipolar disorder, current episode depressed, mild or moderate severity, unspecified: Secondary | ICD-10-CM | POA: Diagnosis not present

## 2014-08-07 MED ORDER — BUPROPION HCL ER (XL) 300 MG PO TB24: 300.0000 mg | ORAL_TABLET | Freq: Every day | ORAL | Status: DC

## 2014-08-07 MED ORDER — ZIPRASIDONE HCL 80 MG PO CAPS: 80.0000 mg | ORAL_CAPSULE | Freq: Two times a day (BID) | ORAL | Status: DC

## 2014-08-07 MED ORDER — DULOXETINE HCL 60 MG PO CPEP: 120.0000 mg | ORAL_CAPSULE | Freq: Every day | ORAL | Status: DC

## 2014-08-07 MED ORDER — TRAZODONE HCL 100 MG PO TABS: 200.0000 mg | ORAL_TABLET | Freq: Every day | ORAL | Status: DC

## 2014-08-07 MED ORDER — CLONAZEPAM 1 MG PO TABS: 1.0000 mg | ORAL_TABLET | Freq: Three times a day (TID) | ORAL | Status: DC
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
## 2014-08-08 DIAGNOSIS — M5126 Other intervertebral disc displacement, lumbar region: Secondary | ICD-10-CM | POA: Diagnosis not present

## 2014-08-08 DIAGNOSIS — M5186 Other intervertebral disc disorders, lumbar region: Secondary | ICD-10-CM | POA: Diagnosis not present

## 2014-08-08 DIAGNOSIS — M4806 Spinal stenosis, lumbar region: Secondary | ICD-10-CM | POA: Diagnosis not present

## 2014-08-08 DIAGNOSIS — Z9889 Other specified postprocedural states: Secondary | ICD-10-CM | POA: Diagnosis not present

## 2014-08-14 DIAGNOSIS — E781 Pure hyperglyceridemia: Secondary | ICD-10-CM | POA: Diagnosis not present

## 2014-08-14 DIAGNOSIS — K219 Gastro-esophageal reflux disease without esophagitis: Secondary | ICD-10-CM | POA: Diagnosis not present

## 2014-08-14 DIAGNOSIS — E282 Polycystic ovarian syndrome: Secondary | ICD-10-CM | POA: Diagnosis not present

## 2014-08-14 DIAGNOSIS — I10 Essential (primary) hypertension: Secondary | ICD-10-CM | POA: Diagnosis not present

## 2014-08-14 DIAGNOSIS — R739 Hyperglycemia, unspecified: Secondary | ICD-10-CM | POA: Diagnosis not present

## 2014-09-02 DIAGNOSIS — Z6841 Body Mass Index (BMI) 40.0 and over, adult: Secondary | ICD-10-CM | POA: Diagnosis not present

## 2014-09-02 DIAGNOSIS — M5126 Other intervertebral disc displacement, lumbar region: Secondary | ICD-10-CM | POA: Diagnosis not present

## 2014-09-03 DIAGNOSIS — E282 Polycystic ovarian syndrome: Secondary | ICD-10-CM | POA: Diagnosis not present

## 2014-09-03 DIAGNOSIS — R739 Hyperglycemia, unspecified: Secondary | ICD-10-CM | POA: Diagnosis not present

## 2014-09-03 DIAGNOSIS — Z1389 Encounter for screening for other disorder: Secondary | ICD-10-CM | POA: Diagnosis not present

## 2014-09-03 DIAGNOSIS — R7309 Other abnormal glucose: Secondary | ICD-10-CM | POA: Diagnosis not present

## 2014-09-03 DIAGNOSIS — I10 Essential (primary) hypertension: Secondary | ICD-10-CM | POA: Diagnosis not present

## 2014-09-03 DIAGNOSIS — K219 Gastro-esophageal reflux disease without esophagitis: Secondary | ICD-10-CM | POA: Diagnosis not present

## 2014-09-03 DIAGNOSIS — Z01419 Encounter for gynecological examination (general) (routine) without abnormal findings: Secondary | ICD-10-CM | POA: Diagnosis not present

## 2014-09-03 DIAGNOSIS — E782 Mixed hyperlipidemia: Secondary | ICD-10-CM | POA: Diagnosis not present

## 2014-09-03 DIAGNOSIS — M545 Low back pain: Secondary | ICD-10-CM | POA: Diagnosis not present

## 2014-10-07 DIAGNOSIS — M5416 Radiculopathy, lumbar region: Secondary | ICD-10-CM | POA: Diagnosis not present

## 2014-10-07 DIAGNOSIS — M4806 Spinal stenosis, lumbar region: Secondary | ICD-10-CM | POA: Diagnosis not present

## 2014-10-07 DIAGNOSIS — F329 Major depressive disorder, single episode, unspecified: Secondary | ICD-10-CM | POA: Diagnosis not present

## 2014-10-07 DIAGNOSIS — K210 Gastro-esophageal reflux disease with esophagitis: Secondary | ICD-10-CM | POA: Diagnosis not present

## 2014-10-07 DIAGNOSIS — F410 Panic disorder [episodic paroxysmal anxiety] without agoraphobia: Secondary | ICD-10-CM | POA: Diagnosis not present

## 2014-10-08 DIAGNOSIS — M4806 Spinal stenosis, lumbar region: Secondary | ICD-10-CM | POA: Diagnosis not present

## 2014-10-08 DIAGNOSIS — M5416 Radiculopathy, lumbar region: Secondary | ICD-10-CM | POA: Diagnosis not present

## 2014-11-06 ENCOUNTER — Ambulatory Visit (HOSPITAL_COMMUNITY): Payer: Self-pay | Admitting: Psychiatry

## 2014-11-06 DIAGNOSIS — M5416 Radiculopathy, lumbar region: Secondary | ICD-10-CM | POA: Diagnosis not present

## 2014-11-06 DIAGNOSIS — M4806 Spinal stenosis, lumbar region: Secondary | ICD-10-CM | POA: Diagnosis not present

## 2014-11-17 NOTE — Consult Note (Signed)
PATIENT NAME:  Jamie [name], TELFORD MR#:  [id number] DATE OF BIRTH:  1971-03-02  DATE OF CONSULTATION:  09/17/2011  REFERRING PHYSICIAN:  Dr. Weber Cooks CONSULTING PHYSICIAN:  Kelsie Kramp H. Posey Pronto, MD  PRIMARY CARE PHYSICIAN:  Nonlocal.   REASON FOR CONSULTATION: Abnormal urinalysis, opinion regarding the patient's hypertension, hyperlipidemia and gastroesophageal reflux disease.    HISTORY OF PRESENT ILLNESS: The patient is a 44 year old white female who is being hospitalized with severe major depression symptoms who was noted to have a urinalysis which suggested a urinary tract infection. The patient reports that she was treated a for urinary tract infection about one month ago. She thinks it was Bactrim prior to that. She was treated with another antibiotic. She has not had any urinary symptoms including burning, frequency, or hesitancy tendency. She denies any fevers or chills. Her, otherwise, main complaint is psychiatric related with depression. She, otherwise, denies any other complaints including abdominal pain, nausea, vomiting, or flank pain.   PAST MEDICAL HISTORY:  1. Depression and anxiety. 2. Bipolar disorder. 3. Hypercholesterolemia. 4. Hypertension. 5. Polycystic ovarian syndrome.   ALLERGIES: Codeine.   CURRENT MEDICATIONS:  1. Tylenol 650 mg every four hours p.r.n.  2. Maalox 30 mL every four hours p.r.n.  3. Lorazepam 1 mg every four hours p.r.n.  4. Milk of magnesia 30 mL p.r.n. for constipation.  5. Nicotine oral inhaler kit.  6. Ambien 5 at bedtime p.r.n.  7. Bupropion 150 mg daily.  8. Carbamazepine 200 mg p.o. b.i.d.  9. Cipro 500 p.o. daily.  10. Clonazepam 1 mg p.o. t.i.d.  11. Lexapro 20 daily.  12. Lisinopril 20 daily.  13. Metformin XR 200 daily.  14. Omeprazole 20 daily.  15. Propranolol 40 t.i.d.  16. Crestor 20 mg daily.  17. Tizanidine 2 mg p.o. b.i.d.  18. Topamax 50 mg p.o. b.i.d.  19. Trazodone 200 mg at bedtime.  20. Geodon 60 mg p.o. b.i.d.   21. Ambien 10 mg at bedtime.   SOCIAL HISTORY: She denies any alcohol use or recreational drugs. No smoking.   FAMILY HISTORY: Positive for hypertension.   REVIEW OF SYSTEMS: CONSTITUTIONAL: Denies any fevers or chills. No weight loss. No weight gain. No pain. HEENT: Denies any visual deficits. No double vision. No redness. No drainage. Nasal exam: Denies any nasal epistaxis. No nasal drainage. Oropharynx: Denies any mouth lesions. No difficulty with swallowing. CARDIOVASCULAR: Denies any chest pains, palpitations, has history of hypertension. No syncope. No arrhythmias. PULMONARY: Denies any wheezing, coughing. No hemoptysis. No chronic obstructive pulmonary disease. No tuberculosis. No pneumonia. GASTROINTESTINAL: Denies any nausea, vomiting, abdominal pain. No changes in bowel habits. No hematemesis or hematochezia. GU: Denies any burning, frequency, or urgency. Denies any history of renal stones. SKIN: Denies any skin rash or changes in mole, hair or skin lesions. MUSCULOSKELETAL: Denies any erythema, swelling or gout. NEUROLOGIC: Denies any numbness, cerebrovascular accident, transient ischemic attack or seizure disorder. PSYCHIATRIC: Has depression, bipolar disorder, anxiety disorder.   PHYSICAL EXAMINATION:  VITAL SIGNS: Temperature 98.2, pulse 97, respirations 20, blood pressure 112/80.   GENERAL: The patient is awake, alert, oriented, in no acute distress.   HEENT: Head atraumatic, normocephalic. Pupils equally round, reactive to light and accommodation. No scleral icterus. No conjunctival pallor. Extraocular movements intact. Nasal exam shows no ulceration or drainage. Oropharynx is clear without any exudates. Ear exam shows no erythema or swelling.   NECK: No thyromegaly. No carotid bruits.   HEART: Regular rate and rhythm. No murmurs, rubs, clicks, or gallops. PMI is  not displaced.   LUNGS: No accessory muscle use. Clear to auscultation bilaterally without any rales, rhonchi, or  wheezing.   ABDOMEN: Soft, nontender, nondistended. No hepatosplenomegaly.   EXTREMITIES: No clubbing, cyanosis, or edema.   SKIN: There is no rash.   LYMPHATICS: No lymph nodes palpable.   VASCULAR: Good DP, PT pulses.   NEUROLOGIC: Awake, alert, oriented x3. No focal deficits.   PSYCHIATRIC: Currently not anxious or depressed.   LABORATORY, DIAGNOSTIC, AND RADIOLOGICAL DATA: BMP: Glucose 93, BUN 14, creatinine 0.95, sodium 138, potassium 3.9, chloride 103, CO2 23, calcium 9.1. LFTs were normal. TSH 2.01. Tegretol level 7.9. TADs were negative except MDMA positive. WBC 8.5, hemoglobin 13.2, platelet count 204. Urinalysis showed 2+ blood, protein negative, leukocyte esterase 2+, RBCs 8, WBCs 22.   ASSESSMENT AND PLAN: Patient with severe depression noted to have abnormal urinalysis with possible urinary tract infection, has had treatment for urinary tract infection recently.  1. Urinary tract infection. The patient is asymptomatic, has been recently treated at this time. We will go ahead and check a urine culture. Place her on p.o. Cipro. If her cultures fail to grow anything, I would continue the antibiotics after three days.  2. Hypertension. The patient is on propranolol which she does not like the liquid form. Will change to p.o. Continue lisinopril as taking at home.  3. Polycystic ovarian disease. Continue metformin at the current dose that she was taking at home.  4. Hyperlipidemia. Continue Crestor at 20 mg daily.  5. Gastroesophageal reflux disease. Continue omeprazole as taking at home.  6. Miscellaneous. The patient is ambulatory so there is no need for deep vein thrombosis prophylaxis.   TIME SPENT: 35 minutes were spent on this consult.    ____________________________ Lafonda Mosses. Posey Pronto, MD shp:ap D: 09/17/2011 18:36:32 ET T: 09/18/2011 10:19:13 ET JOB#: [id number]  cc: Lief Palmatier H. Posey Pronto, MD, <Dictator> Alric Seton MD ELECTRONICALLY SIGNED 10/01/2011 7:52

## 2014-11-17 NOTE — Discharge Summary (Signed)
PATIENT NAME:  Jamie [name], [name] MR#:  [id number] DATE OF BIRTH:  1971-03-19  DATE OF ADMISSION:  09/17/2011 DATE OF DISCHARGE:  09/21/2011  HOSPITAL COURSE: See dictated History and Physical for details of admission. A 44 year old woman with a history of depression and anxiety, was referred to the hospital by her therapist because of suicidal ideation. On admission the patient reported that she was having worsening suicidal ideation, felt out of control. In the hospital she was initially more withdrawn and depressed but she did participate in groups appropriately. Some medication changes were made, especially the initiation of Cymbalta. Other medications were continued. The patient showed significant improvement in her affect. At no time did she engage in any suicidal behavior. She showed good insight and within a few days felt comfortable with discharge. She already has outpatient followup with a therapist arranged in the community, psychiatric followup with medication management to be arranged in the community as well.   DISCHARGE MEDICATIONS:  1. Ambien 5 mg at night as needed for sleep and 10 mg standing at night for sleep.  2. Geodon 60 mg twice a day.  3. Trazodone 200 mg at bedtime.  4. Topamax 50 mg twice a day.  5. Klonopin 1 mg 3 times a day.   6. Carbamazepine 200 mg twice a day.   7. Metformin 2000 mg extended release in the morning.   8. Zanaflex 2 mg twice a day.   9. Prilosec 20 mg in the morning.  10. Crestor 20 mg per day.  11. Wellbutrin SR 150 mg twice a day.   12. Cymbalta 30 mg per day.   13. Inderal 20 mg 3 times a day.   14. Zofran 4 mg q.4 hours p.r.n. for nausea and vomiting.   LABORATORY DATA:  Urinalysis possibly showed infection. Culture was mixed flora. Tegretol level 7.9. Thyroid stimulating hormone normal. Alcohol undetectable. Chemistry panel unremarkable. CBC unremarkable. Drug screen positive for MDMA which I would guess is probably due to the Wellbutrin. The  urinalysis did show 22 white blood cells in it. As noted previously it had only cultured out mixed flora. We initially were going to treat this but she has a history of recurrent urinary tract infections, currently with no symptoms. Consultation had been obtained and did not recommend further antibiotics.   MENTAL STATUS EXAM AT DISCHARGE: Somewhat disheveled woman. Looks older than her stated age. Decreased eye contact. Flattish affect. Speech quiet but easy to understand. Thoughts lucid. No evidence of loosening of associations or delusional thinking. Denies suicidal or homicidal ideation. Mood stated as better. Insight and judgment improved.   DIAGNOSIS PRINCIPLE AND PRIMARY:  AXIS I: Major depression, severe, recurrent.   SECONDARY DIAGNOSES:  AXIS I: Rule out bipolar disorder.  AXIS II: Deferred.  AXIS III: Chronic headaches, borderline diabetes, high blood pressure, elevated cholesterol, gastric reflux symptoms.  AXIS IV: Moderate to severe from chronic social isolation.  AXIS V: Functioning at time of discharge 56.     ____________________________ Audery AmelJohn T. Tritia Endo, MD jtc:vtd D: 09/24/2011 16:38:39 ET T: 09/25/2011 09:10:51 ET JOB#: [id number]  cc: Audery AmelJohn T. Corynn Solberg, MD, <Dictator> Audery AmelJOHN T Jojo Geving MD ELECTRONICALLY SIGNED 09/25/2011 9:55

## 2014-11-17 NOTE — H&P (Signed)
PATIENT NAME:  Jamie [name], Jamie [name] MR#:  [id number] DATE OF BIRTH:  [birth year]  DATE OF ADMISSION:  09/17/2011  IDENTIFYING INFORMATION: A 44 year old woman sent to the Emergency Room.   CHIEF COMPLAINT: "My therapist referred me."   HISTORY OF PRESENT ILLNESS: The patient went to see her therapist for her usual appointment yesterday and reported that she was having suicidal thoughts. She had been having thoughts about overdosing on her medicine but had not acted on it. Her depression has been worse. She has been feeling depressed almost all the time. Energy level is chronically low. Sleep is usually all right given the medication that she takes. Appetite is stable. She has very little enjoyment of anything in her life. She cries frequently. She feels hopeless. She is not currently having hallucinations or other psychotic symptoms. She has been compliant with her prescribed medications and has seen her therapist but feels that things have been getting worse or at least staying really bad ever since September. In September of 2012, she had a severe major depression that was occasioned by a breakup with a previous relationship. Since then, she has had four hospitalizations but does not feel like any of them have provided any consistent improvement. Her current psychiatric medications include Lexapro, Wellbutrin, Tegretol, Klonopin and Geodon. See full list later in dictation. She does not report any acute new stress in her life. She does not abuse substances.   PAST PSYCHIATRIC HISTORY: The patient says she has had problems with depression for at least 10 years, has had multiple hospitalizations. Over the last 10 years, she says she has been on "every medicine you can imagine." She feels like they would all help briefly but then stopped helping. She says she has never actually tried to kill herself. She seems to have had some improvement until September of 2012, and since then things have been worse. She has gone  through periods in the past of having auditory hallucinations that made negative comments about her. Now that she is taking an antipsychotic, she says that that no longer happens. We do not have old records yet, so I do not know specifically what diagnoses have been given to her. She denies any history of violence, denies any history of substance abuse.   SUBSTANCE ABUSE HISTORY: She denies that she drinks, or uses any recreational drugs, or has any history of abuse of alcohol or drugs.   SOCIAL HISTORY: The patient lives by herself with pets. Her mother lives across the street, and she has extended family who live in the area. She says her relationship with her family is great and not a stress for her. They are one of the positive things in her life. She was in a relationship that had been long-term until it broke up this summer and since then has been more depressed. No children of her own, never been married. She used to work until about three years ago when she had back surgery. Since that time she has been disabled.   FAMILY HISTORY: Not reported.   REVIEW OF SYSTEMS: The patient complains of feeling fatigued, feeling depressed, feeling hopeless. She is not currently having hallucinations, no evidence of delusions or paranoia. She has chronic headaches, no specific pain right now. No specific GI complaints.   MENTAL STATUS EXAM: The patient is a somewhat disheveled, not very well groomed, a woman who looks her stated age or older. She is cooperative with the interview. Eye contact is okay. Psychomotor activity is lethargic and  limited. Speech is quiet but easy to understand, decreased in total amount. Thoughts are slow and somewhat concrete. Mood is stated as depressed. Affect is flat. She denies hallucinations of any sort. She says she has suicidal thoughts but no intent of acting on it currently. No homicidal ideation. Questionable judgment, seems to have adequate insight.   PHYSICAL EXAMINATION:   VITAL SIGNS: Currently, temperature is 98.2, pulse 97, respirations 20, blood pressure 112/80.   GENERAL: The patient does not appear in any physical distress.   SKIN: No acute skin lesions are identified.   HEENT: Pupils are equal and reactive. Face is symmetric. Cranial nerves are all intact. Full range of motion at extremities. Normal gait.   LUNGS: Clear to auscultation with no wheezes.   HEART: Regular rate and rhythm.   ABDOMEN: Soft, nontender, normal bowel sounds.   CURRENT MEDICATIONS:  1. Ambien 10 mg per day.  2. Geodon 60 mg twice a day.  3. Trazodone 200 mg at night.  4. Topamax 50 mg twice a day.  5. Tizanidine  2 mg twice a day.  6. Propranolol 40 mg three times a day.  7. Omeprazole 20 mg per day.  8. Metformin Extended Release 2000 mg per day.  9. Lisinopril 20 mg per day.  10. Lexapro 10 mg per day.  11. Tegretol 200 mg twice a day.  12. Klonopin 1 mg three times a day. 13. Crestor 20 mg per day.  14. Wellbutrin 150 mg twice a day.   ALLERGIES: Codeine.   LABORATORY, DIAGNOSTIC AND RADIOLOGICAL DATA:  Drug screen is positive for MDMA. I would bet that is probably from the Wellbutrin.  Tegretol level is 7.9, which is in the normal range.  TSH is normal.  Alcohol level is negative.  Chemistries are all normal.  CBC is all normal.  Urinalysis is consistent with a urinary tract infection with positive white cells and red cells, trace bacteria and positive leukocyte esterase,   ASSESSMENT: A 44 year old woman with multiple symptoms of severe major depression. Symptoms have been persistent or worsened for the last several months. She is not currently psychotic. She has been compliant with the current medication regimen. She also has a recent significant social loss and some stress from chronic illness and disability. She is on a large number of medications. She currently is having active suicidal thoughts requiring hospitalization.   TREATMENT PLAN: Medication  regimen is quite complicated. I am not entirely sure what the Tegretol is for, although she says that it helps her with "racing thoughts." The Klonopin seems to be well-established. As far as the antidepressants, I would suggest that we increase the Lexapro to 20 mg a day. We can try making some changes to antidepressants. Currently ECT does not seem to be an easy option given the number of anticonvulsant medicines she is taking. The patient will be included in groups and activities on the unit. Psychoeducation and supportive therapy will be done. We will try and get collateral information and be in touch, if possible, with her family and outpatient providers.   DIAGNOSIS, PRINCIPAL AND PRIMARY:  AXIS I: Major depressive episode, severe, recurrent.    SECONDARY DIAGNOSIS:  AXIS I:  No further.   AXIS II: No diagnosis.   AXIS III:  1. Chronic headache. 2. Borderline diabetes. 3. High blood pressure. 4. Elevated cholesterol. 5. Gastric reflux.   AXIS IV: Moderate-to-severe  stress from social isolation.   AXIS V: Functioning at time of evaluation 26.  ____________________________  Audery AmelJohn T. Clapacs, MD jtc:cbb D: 09/17/2011 15:27:32 ET T: 09/17/2011 16:35:56 ET JOB#: [id number]  cc: Audery AmelJohn T. Clapacs, MD, <Dictator> Audery AmelJOHN T CLAPACS MD ELECTRONICALLY SIGNED 09/17/2011 22:16

## 2014-12-27 ENCOUNTER — Encounter (HOSPITAL_COMMUNITY): Payer: Self-pay | Admitting: Psychiatry

## 2014-12-27 ENCOUNTER — Ambulatory Visit (INDEPENDENT_AMBULATORY_CARE_PROVIDER_SITE_OTHER): Payer: Medicare Other | Admitting: Psychiatry

## 2014-12-27 VITALS — BP 116/78 | HR 112 | Ht 63.0 in | Wt 249.6 lb

## 2014-12-27 DIAGNOSIS — F3130 Bipolar disorder, current episode depressed, mild or moderate severity, unspecified: Secondary | ICD-10-CM

## 2014-12-27 DIAGNOSIS — F332 Major depressive disorder, recurrent severe without psychotic features: Secondary | ICD-10-CM

## 2014-12-27 MED ORDER — DULOXETINE HCL 60 MG PO CPEP: 120.0000 mg | ORAL_CAPSULE | Freq: Every day | ORAL | Status: DC

## 2014-12-27 MED ORDER — ZIPRASIDONE HCL 80 MG PO CAPS: 80.0000 mg | ORAL_CAPSULE | Freq: Two times a day (BID) | ORAL | Status: DC

## 2014-12-27 MED ORDER — BUPROPION HCL ER (XL) 300 MG PO TB24: 300.0000 mg | ORAL_TABLET | Freq: Every day | ORAL | Status: DC

## 2014-12-27 MED ORDER — TRAZODONE HCL 100 MG PO TABS: 200.0000 mg | ORAL_TABLET | Freq: Every day | ORAL | Status: DC

## 2014-12-27 MED ORDER — CLONAZEPAM 1 MG PO TABS: 1.0000 mg | ORAL_TABLET | Freq: Three times a day (TID) | ORAL | Status: DC
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
## 2015-01-03 DIAGNOSIS — R739 Hyperglycemia, unspecified: Secondary | ICD-10-CM | POA: Diagnosis not present

## 2015-01-03 DIAGNOSIS — K219 Gastro-esophageal reflux disease without esophagitis: Secondary | ICD-10-CM | POA: Diagnosis not present

## 2015-01-03 DIAGNOSIS — E282 Polycystic ovarian syndrome: Secondary | ICD-10-CM | POA: Diagnosis not present

## 2015-01-03 DIAGNOSIS — I10 Essential (primary) hypertension: Secondary | ICD-10-CM | POA: Diagnosis not present

## 2015-01-03 DIAGNOSIS — E782 Mixed hyperlipidemia: Secondary | ICD-10-CM | POA: Diagnosis not present

## 2015-01-08 DIAGNOSIS — E782 Mixed hyperlipidemia: Secondary | ICD-10-CM | POA: Diagnosis not present

## 2015-01-08 DIAGNOSIS — M545 Low back pain: Secondary | ICD-10-CM | POA: Diagnosis not present

## 2015-01-08 DIAGNOSIS — E282 Polycystic ovarian syndrome: Secondary | ICD-10-CM | POA: Diagnosis not present

## 2015-01-08 DIAGNOSIS — I10 Essential (primary) hypertension: Secondary | ICD-10-CM | POA: Diagnosis not present

## 2015-01-08 DIAGNOSIS — K219 Gastro-esophageal reflux disease without esophagitis: Secondary | ICD-10-CM | POA: Diagnosis not present

## 2015-01-08 DIAGNOSIS — Z01419 Encounter for gynecological examination (general) (routine) without abnormal findings: Secondary | ICD-10-CM | POA: Diagnosis not present

## 2015-02-04 DIAGNOSIS — M4806 Spinal stenosis, lumbar region: Secondary | ICD-10-CM | POA: Diagnosis not present

## 2015-02-04 DIAGNOSIS — M5416 Radiculopathy, lumbar region: Secondary | ICD-10-CM | POA: Diagnosis not present

## 2015-03-03 DIAGNOSIS — H6090 Unspecified otitis externa, unspecified ear: Secondary | ICD-10-CM | POA: Diagnosis not present

## 2015-04-08 DIAGNOSIS — M5416 Radiculopathy, lumbar region: Secondary | ICD-10-CM | POA: Diagnosis not present

## 2015-04-08 DIAGNOSIS — Z6841 Body Mass Index (BMI) 40.0 and over, adult: Secondary | ICD-10-CM | POA: Diagnosis not present

## 2015-04-08 DIAGNOSIS — M4806 Spinal stenosis, lumbar region: Secondary | ICD-10-CM | POA: Diagnosis not present

## 2015-04-09 ENCOUNTER — Ambulatory Visit (INDEPENDENT_AMBULATORY_CARE_PROVIDER_SITE_OTHER): Payer: Medicare Other | Admitting: Psychiatry

## 2015-04-09 VITALS — BP 126/84 | HR 103 | Ht 63.0 in | Wt 239.2 lb

## 2015-04-09 DIAGNOSIS — F323 Major depressive disorder, single episode, severe with psychotic features: Secondary | ICD-10-CM | POA: Diagnosis not present

## 2015-04-09 MED ORDER — TRAZODONE HCL 100 MG PO TABS: 200.0000 mg | ORAL_TABLET | Freq: Every day | ORAL | Status: DC

## 2015-04-09 MED ORDER — BUPROPION HCL ER (XL) 300 MG PO TB24: 300.0000 mg | ORAL_TABLET | Freq: Every day | ORAL | Status: DC

## 2015-04-09 MED ORDER — ZIPRASIDONE HCL 80 MG PO CAPS: 80.0000 mg | ORAL_CAPSULE | Freq: Two times a day (BID) | ORAL | Status: DC

## 2015-04-09 MED ORDER — CLONAZEPAM 1 MG PO TABS: 1.0000 mg | ORAL_TABLET | Freq: Three times a day (TID) | ORAL | Status: DC

## 2015-04-09 MED ORDER — DULOXETINE HCL 60 MG PO CPEP: 120.0000 mg | ORAL_CAPSULE | Freq: Every day | ORAL | Status: DC

## 2015-04-09 NOTE — Progress Notes (Signed)
[facility] MD Progress Note  04/09/2015 2:36 PM Jamie [name]  MRN:  [id number] Subjective: Doing well Principal Problem: Major depression with psychotic features Diagnosis:  Major depression with psychotic features At this time the patient is doing very well. She is cleaning up her mother's home. Her mother was hospitalized and is now back to her own setting once to sell the house and move into an apartment. The patient therefore is going through with her mother all their belongings and selling them off. This is a second traumatic event for the patient has her own reminds her of her father who died a year and a half ago. Other than that the patient seems to be stable. She is socially active. She takes her medicines as prescribed. She denies daily depression or anhedonia. She enjoys her 3 cats and 2 dogs. The patient likes TV and reads. She sleeping and eating well and has good concentration. The patient denies the use of alcohol or drugs. She denies any psychotic symptoms at all. She's not suicidal. She has some mild anxiety but she knows it's that short situationally based on her mom's situation. Eventually her mom move into an apartment and the patient will continue living in her own home with a half acre of which she owns. Patient is on disability is on a fixed income but does very well. The patient will continue taking her medications as prescribed. Patient Active Problem List   Diagnosis Date Noted  . Severe major depression with psychotic features, mood-congruent [F32.3] 05/03/2012    Priority: Medium  . Lumbar herniated disc [M51.26] 01/22/2014  . Major depressive disorder, recurrent episode, severe, specified as with psychotic behavior [F33.3] 02/14/2013  . Headache [R51] 12/06/2012  . Bipolar I disorder, most recent episode (or current) depressed, unspecified [F31.30] 11/15/2012  . Bipolar affective disorder, depressed, mild degree [F31.31] 08/16/2012  . Bipolar 1 disorder, depressed, mild  [F31.31] 08/16/2012  . Schizoaffective disorder, depressive type [F25.1] 06/09/2011  . Generalized anxiety disorder [F41.1] 06/09/2011   Total Time spent with patient: 30 minutes   Past Medical History:  Past Medical History  Diagnosis Date  . Hypertension   . High cholesterol   . Bipolar 1 disorder   . Depression   . Anxiety   . Headache(784.0)   . Complication of anesthesia   . PONV (postoperative nausea and vomiting)     nausea  . PCOS (polycystic ovarian syndrome)   . GERD (gastroesophageal reflux disease)   . H/O hiatal hernia     Past Surgical History  Procedure Laterality Date  . Back surgery  2009  . Cholecystectomy  07/2002  . Anal fissure repair      x 3  . Knee arthroscopy Right   . Lumbar laminectomy/decompression microdiscectomy Right 01/22/2014    Procedure: LUMBAR FOUR TO FIVE LUMBAR LAMINECTOMY/DECOMPRESSION MICRODISCECTOMY 1 LEVEL;  Surgeon: Karn Cassis, MD;  Location: [facility];  Service: Neurosurgery;  Laterality: Right;  Right L45 diskectomy   Family History:  Family History  Problem Relation Age of Onset  . Anxiety disorder Maternal Grandmother   . Depression Maternal Grandmother    Social History:  History  Alcohol Use No     History  Drug Use No    Social History   Social History  . Marital Status: Single    Spouse Name: [name]  . Number of Children: 0  . Years of Education: college   Occupational History  . disabled    Social History Main Topics  .  Smoking status: Never Smoker   . Smokeless tobacco: Never Used  . Alcohol Use: No  . Drug Use: No  . Sexual Activity: No   Other Topics Concern  . Not on file   Social History Narrative   Patient lives at home alone    Patient is right handed   Patient drinks soda's daily   Additional History:    Sleep: Good  Appetite:  Good   Assessment:   Musculoskeletal: Strength & Muscle Tone: within normal limits Gait & Station: normal Patient leans: Right   Psychiatric  Specialty Exam: Physical Exam  ROS  Blood pressure 126/84, pulse 103, height  (1.6 m), weight 239 lb 3.2 oz (108.5 kg).Body mass index is 42.38 kg/(m^2).  General Appearance: Casual  Eye Contact::  Good  Speech:  Clear and Coherent  Volume:  Normal  Mood:  Dysphoric  Affect:  NA and Appropriate  Thought Process:  Coherent  Orientation:  Full (Time, Place, and Person)  Thought Content:  WDL  Suicidal Thoughts:  No  Homicidal Thoughts:  No  Memory:  NA  Judgement:  Good  Insight:  Good  Psychomotor Activity:  Normal  Concentration:  Good  Recall:  Fair  Fund of Knowledge:Good  Language: Good  Akathisia:  No  Handed:  Right  AIMS (if indicated):     Assets:  Communication Skills  ADL's:  Good   Cognition: WNL  Sleep:        Current Medications: Current Outpatient Prescriptions  Medication Sig Dispense Refill  . acetaminophen (TYLENOL) 500 MG tablet Take 1,000 mg by mouth every 6 (six) hours as needed for mild pain or moderate pain.    [name] Kitchen buPROPion (WELLBUTRIN XL) 300 MG 24 hr tablet Take 1 tablet (300 mg total) by mouth daily. 30 tablet 5  . clonazePAM (KLONOPIN) 1 MG tablet Take 1 tablet (1 mg total) by mouth 3 (three) times daily. 90 tablet 5  . DULoxetine (CYMBALTA) 60 MG capsule Take 2 capsules (120 mg total) by mouth daily. 60 capsule 5  . lisinopril (PRINIVIL,ZESTRIL) 5 MG tablet Take 5 mg by mouth daily.    . metformin (FORTAMET) 500 MG (OSM) 24 hr tablet Take 1,500 mg by mouth daily with breakfast.    . omeprazole (PRILOSEC) 20 MG capsule Take 1 capsule (20 mg total) by mouth daily. 30 capsule 0  . rosuvastatin (CRESTOR) 20 MG tablet Take 20 mg by mouth daily.      [name] Kitchen topiramate (TOPAMAX) 50 MG tablet Take 1 tablet (50 mg total) by mouth 2 (two) times daily. 60 tablet 11  . traZODone (DESYREL) 100 MG tablet Take 2 tablets (200 mg total) by mouth at bedtime. 60 tablet 5  . ziprasidone (GEODON) 80 MG capsule Take 1 capsule (80 mg total) by mouth 2 (two) times daily  with a meal. 60 capsule 6   No current facility-administered medications for this visit.    Lab Results: No results found for this or any previous visit (from the past 48 hour(s)).  Physical Findings: AIMS:  , ,  ,  ,    CIWA:    COWS:     Treatment Plan Summary: At this time the patient continue taking all her medications are noted 1 probably of course be psychosis associated with depression. Her treatment is to take Geodon 80 mg twice a day. The patient shows no evidence of tardive dyskinesia. Given the fact the patient had a significant psychiatric hospitalization just last year I would  continue this medicine without any adjustments for at least another 1 or 2 years and consider lowering it. She was completely understanding this. It should be noted the patient has controlled hypertension and no diabetes no hypercholesterolemia. The patient will continue taking Cymbalta and Wellbutrin as prescribed for her depression which is her second problem. Her third problem is that of anxiety and poor sleep. The patient will continue taking Klonopin 1 mg 3 times a day and trazodone as ordered. Patient is doing very well. She is stable. She denies any chest pain or shortness of breath or neurological symptoms. The patient return to see me in 5 months.   Medical Decision Making:  Established Problem, Stable/Improving (1)     Lillyahna Hemberger IRVING 04/09/2015, 2:36 PM

## 2015-05-01 ENCOUNTER — Telehealth (HOSPITAL_COMMUNITY): Payer: Self-pay

## 2015-05-01 DIAGNOSIS — F323 Major depressive disorder, single episode, severe with psychotic features: Secondary | ICD-10-CM

## 2015-05-01 MED ORDER — DULOXETINE HCL 60 MG PO CPEP: 120.0000 mg | ORAL_CAPSULE | Freq: Every day | ORAL | Status: DC

## 2015-05-01 MED ORDER — ZIPRASIDONE HCL 80 MG PO CAPS: 80.0000 mg | ORAL_CAPSULE | Freq: Two times a day (BID) | ORAL | Status: DC

## 2015-05-01 MED ORDER — BUPROPION HCL ER (XL) 300 MG PO TB24: 300.0000 mg | ORAL_TABLET | Freq: Every day | ORAL | Status: DC

## 2015-05-01 NOTE — Telephone Encounter (Signed)
Medication refill request for 90 day orders of patient's prescribed Duloxetine, Bupropion XL, and Ziprasidone.  Dr. Donell Beers approved both orders and new 90 day orders e-scribed to patient's [facility] in [place], [place].  New orders for patient's Cymbalta, Wellbutrin XL and Geodon all e-scribed to patient's [facility] in [place] plus one refill as authorized by [facility] and patient request to change to 90 day prescription orders due to insurance.

## 2015-05-05 ENCOUNTER — Ambulatory Visit (INDEPENDENT_AMBULATORY_CARE_PROVIDER_SITE_OTHER): Payer: Medicare Other | Admitting: Nurse Practitioner

## 2015-05-05 ENCOUNTER — Encounter: Payer: Self-pay | Admitting: Nurse Practitioner

## 2015-05-05 VITALS — BP 120/78 | HR 101 | Ht 63.0 in | Wt 244.4 lb

## 2015-05-05 DIAGNOSIS — R51 Headache: Secondary | ICD-10-CM

## 2015-05-05 DIAGNOSIS — R519 Headache, unspecified: Secondary | ICD-10-CM

## 2015-05-05 MED ORDER — TOPIRAMATE 50 MG PO TABS
50.0000 mg | ORAL_TABLET | Freq: Two times a day (BID) | ORAL | Status: DC
Start: 2015-05-05 — End: 2015-07-03

## 2015-05-05 NOTE — Patient Instructions (Signed)
Continue Topamax at current dose will refill for one year Call for increase in headaches Follow-up yearly and when necessary

## 2015-05-05 NOTE — Progress Notes (Signed)
[facility]  PATIENT: Jamie [name] DOB: 04-Mar-1971   REASON FOR VISIT: episodic headache HISTORY FROM:patient    HISTORY OF PRESENT ILLNESS:Ms. Jamie [name], 44 year old female returns for followup. She has a history of headaches. She was last seen in this office 05/01/14.Headaches  are in good control. She is currently taking Topamax 50 twice a day. She has significant bipolar disorder and sees psychiatry Dr. Donell Beers.She has maybe 2 headaches a month or less. She also has a past medical history of hypertension, , depression and anxiety, hyperlipidemia, chronic low back pain, lumbar laminectomy in June 2016 of this year by Dr. Jeral Fruit. She also has a history of polycystic disease and obesity. She denies any motor or sensory deficits. MRI and MRV of the brain in the past have been normal. She has no new neurologic complaints. She returns for refill and reevaluation     REVIEW OF SYSTEMS: Full 14 system review of systems performed and notable only for those listed, all others are neg:  Constitutional: neg  Cardiovascular: neg Ear/Nose/Throat: neg  Skin: neg Eyes: neg Respiratory: neg Gastroitestinal: neg  Hematology/Lymphatic: neg  Endocrine: neg Musculoskeletal:back pain Allergy/Immunology: neg Neurological: neg Psychiatric: Depression anxiety, bipolar disorder Sleep : neg   ALLERGIES: Allergies  Allergen Reactions  . Codeine Hives and Swelling  . Morphine And Related Swelling and Rash    7/2: Pt state is OK in small doses and is willing to take Morphine this admission    HOME MEDICATIONS: Outpatient Prescriptions Prior to Visit  Medication Sig Dispense Refill  . acetaminophen (TYLENOL) 500 MG tablet Take 1,000 mg by mouth every 6 (six) hours as needed for mild pain or moderate pain.    [name] Kitchen buPROPion (WELLBUTRIN XL) 300 MG 24 hr tablet Take 1 tablet (300 mg total) by mouth daily. 90 tablet 1  . clonazePAM (KLONOPIN) 1 MG tablet Take 1 tablet (1 mg total) by  mouth 3 (three) times daily. 90 tablet 5  . DULoxetine (CYMBALTA) 60 MG capsule Take 2 capsules (120 mg total) by mouth daily. 180 capsule 1  . lisinopril (PRINIVIL,ZESTRIL) 5 MG tablet Take 5 mg by mouth daily.    . metformin (FORTAMET) 500 MG (OSM) 24 hr tablet Take 1,500 mg by mouth daily with breakfast.    . omeprazole (PRILOSEC) 20 MG capsule Take 1 capsule (20 mg total) by mouth daily. 30 capsule 0  . rosuvastatin (CRESTOR) 20 MG tablet Take 20 mg by mouth daily.      [name] Kitchen topiramate (TOPAMAX) 50 MG tablet Take 1 tablet (50 mg total) by mouth 2 (two) times daily. 60 tablet 11  . traZODone (DESYREL) 100 MG tablet Take 2 tablets (200 mg total) by mouth at bedtime. 60 tablet 5  . ziprasidone (GEODON) 80 MG capsule Take 1 capsule (80 mg total) by mouth 2 (two) times daily with a meal. 180 capsule 1   No facility-administered medications prior to visit.    PAST MEDICAL HISTORY: Past Medical History  Diagnosis Date  . Hypertension   . High cholesterol   . Bipolar 1 disorder (HCC)   . Depression   . Anxiety   . Headache(784.0)   . Complication of anesthesia   . PONV (postoperative nausea and vomiting)     nausea  . PCOS (polycystic ovarian syndrome)   . GERD (gastroesophageal reflux disease)   . H/O hiatal hernia     PAST SURGICAL HISTORY: Past Surgical History  Procedure Laterality Date  . Back surgery  2009  .  Cholecystectomy  07/2002  . Anal fissure repair      x 3  . Knee arthroscopy Right   . Lumbar laminectomy/decompression microdiscectomy Right 01/22/2014    Procedure: LUMBAR FOUR TO FIVE LUMBAR LAMINECTOMY/DECOMPRESSION MICRODISCECTOMY 1 LEVEL;  Surgeon: Karn Cassis, MD;  Location: [facility];  Service: Neurosurgery;  Laterality: Right;  Right L45 diskectomy    FAMILY HISTORY: Family History  Problem Relation Age of Onset  . Anxiety disorder Maternal Grandmother   . Depression Maternal Grandmother     SOCIAL HISTORY: Social History   Social History  .  Marital Status: Single    Spouse Name: [name]  . Number of Children: 0  . Years of Education: college   Occupational History  . disabled    Social History Main Topics  . Smoking status: Never Smoker   . Smokeless tobacco: Never Used  . Alcohol Use: No  . Drug Use: No  . Sexual Activity: No   Other Topics Concern  . Not on file   Social History Narrative   Patient lives at home alone    Patient is right handed   Patient drinks soda's daily     PHYSICAL EXAM  Filed Vitals:   05/05/15 1429  BP: 120/78  Pulse: 101  Height:  (1.6 m)  Weight: 244 lb 6.4 oz (110.859 kg)   Body mass index is 43.3 kg/(m^2). General: well developed, obese female seated, in no evident distress  Head: head normocephalic and atraumatic. Oropharynx benign  Neck: supple with no carotid or supraclavicular bruits  Cardiovascular: regular rate and rhythm, no murmurs  Neurologic Exam  Mental Status: Awake and fully alert. Oriented to place and time. Follows all commands, speech and language are normal  Cranial Nerves: Pupils equal, briskly reactive to light. Extraocular movements full without nystagmus. Visual fields full to confrontation. Hearing intact and symmetric to finger snap. Facial sensation intact. Face, tongue, palate move normally and symmetrically. Neck flexion and extension normal.  Motor: Normal bulk and tone. Normal strength in all tested extremity muscles. No focal weakness  Sensory.: intact to touch and pinprick and vibratory.  Coordination: Rapid alternating movements normal in all extremities. Finger-to-nose and heel-to-shin performed accurately bilaterally.  Gait and Station: Arises from chair without difficulty. Stance is normal. Gait demonstrates normal stride length and balance . Able to heel, toe and tandem walk without difficulty.  Reflexes: 2+ and symmetric. Toes downgoing.   DIAGNOSTIC DATA (LABS, IMAGING, TESTING)  ASSESSMENT AND PLAN  44 y.o. year old  female  has a past medical history of Hypertension; High cholesterol; Bipolar 1 disorder (HCC); Depression; Anxiety; Headache(784.0); PCOS (polycystic ovarian syndrome); GERD (gastroesophageal reflux disease); and H/O hiatal hernia. Here to follow up. Her headaches are well controlled on Topamax  Continue Topamax at current dose will refill for one year Call for increase in headaches Follow-up yearly and when necessary Nilda Riggs, [facility], [facility], APRN  [facility] [address] [place], [place] [address] [phone]

## 2015-05-06 DIAGNOSIS — M4806 Spinal stenosis, lumbar region: Secondary | ICD-10-CM | POA: Diagnosis not present

## 2015-05-06 DIAGNOSIS — K219 Gastro-esophageal reflux disease without esophagitis: Secondary | ICD-10-CM | POA: Diagnosis not present

## 2015-05-06 DIAGNOSIS — I10 Essential (primary) hypertension: Secondary | ICD-10-CM | POA: Diagnosis not present

## 2015-05-06 DIAGNOSIS — E782 Mixed hyperlipidemia: Secondary | ICD-10-CM | POA: Diagnosis not present

## 2015-05-06 DIAGNOSIS — R739 Hyperglycemia, unspecified: Secondary | ICD-10-CM | POA: Diagnosis not present

## 2015-05-06 DIAGNOSIS — M5416 Radiculopathy, lumbar region: Secondary | ICD-10-CM | POA: Diagnosis not present

## 2015-05-06 DIAGNOSIS — Z6841 Body Mass Index (BMI) 40.0 and over, adult: Secondary | ICD-10-CM | POA: Diagnosis not present

## 2015-05-07 DIAGNOSIS — E782 Mixed hyperlipidemia: Secondary | ICD-10-CM | POA: Diagnosis not present

## 2015-05-09 NOTE — Progress Notes (Signed)
I have reviewed and agreed above plan. 

## 2015-05-13 DIAGNOSIS — I10 Essential (primary) hypertension: Secondary | ICD-10-CM | POA: Diagnosis not present

## 2015-05-13 DIAGNOSIS — K219 Gastro-esophageal reflux disease without esophagitis: Secondary | ICD-10-CM | POA: Diagnosis not present

## 2015-05-13 DIAGNOSIS — Z23 Encounter for immunization: Secondary | ICD-10-CM | POA: Diagnosis not present

## 2015-05-13 DIAGNOSIS — E282 Polycystic ovarian syndrome: Secondary | ICD-10-CM | POA: Diagnosis not present

## 2015-05-13 DIAGNOSIS — M545 Low back pain: Secondary | ICD-10-CM | POA: Diagnosis not present

## 2015-05-19 DIAGNOSIS — Z1231 Encounter for screening mammogram for malignant neoplasm of breast: Secondary | ICD-10-CM | POA: Diagnosis not present

## 2015-05-21 ENCOUNTER — Other Ambulatory Visit: Payer: Self-pay | Admitting: Nurse Practitioner

## 2015-05-27 DIAGNOSIS — M4806 Spinal stenosis, lumbar region: Secondary | ICD-10-CM | POA: Diagnosis not present

## 2015-05-27 DIAGNOSIS — Z6841 Body Mass Index (BMI) 40.0 and over, adult: Secondary | ICD-10-CM | POA: Diagnosis not present

## 2015-05-27 DIAGNOSIS — M5416 Radiculopathy, lumbar region: Secondary | ICD-10-CM | POA: Diagnosis not present

## 2015-07-03 ENCOUNTER — Other Ambulatory Visit: Payer: Self-pay

## 2015-07-03 MED ORDER — TOPIRAMATE 50 MG PO TABS: 50.0000 mg | ORAL_TABLET | Freq: Two times a day (BID) | ORAL | Status: DC

## 2015-07-10 ENCOUNTER — Telehealth (HOSPITAL_COMMUNITY): Payer: Self-pay

## 2015-07-10 DIAGNOSIS — F323 Major depressive disorder, single episode, severe with psychotic features: Secondary | ICD-10-CM

## 2015-07-10 NOTE — Telephone Encounter (Signed)
Medication refill request - Fax request to change patient's Trazodone order to a 90 day supply. Last ordered 04/09/15 as a 30 day order plus 5 refills and patient returns on 09/10/15.

## 2015-07-11 MED ORDER — TRAZODONE HCL 100 MG PO TABS: 200.0000 mg | ORAL_TABLET | Freq: Every day | ORAL | Status: DC

## 2015-07-11 NOTE — Telephone Encounter (Signed)
Met with Dr. Casimiro Needle who approved a 90 day order for patient's requested Trazodone.  A new 90 day order [facility] [place], [place] as approved with request to discontinue any past orders.

## 2015-09-01 DIAGNOSIS — N390 Urinary tract infection, site not specified: Secondary | ICD-10-CM | POA: Diagnosis not present

## 2015-09-10 ENCOUNTER — Ambulatory Visit (INDEPENDENT_AMBULATORY_CARE_PROVIDER_SITE_OTHER): Payer: Medicare Other | Admitting: Psychiatry

## 2015-09-10 VITALS — BP 111/77 | HR 98 | Ht 63.0 in | Wt 230.6 lb

## 2015-09-10 DIAGNOSIS — F323 Major depressive disorder, single episode, severe with psychotic features: Secondary | ICD-10-CM

## 2015-09-10 DIAGNOSIS — F333 Major depressive disorder, recurrent, severe with psychotic symptoms: Secondary | ICD-10-CM

## 2015-09-10 MED ORDER — BUPROPION HCL ER (XL) 300 MG PO TB24: 300.0000 mg | ORAL_TABLET | Freq: Every day | ORAL | Status: DC

## 2015-09-10 MED ORDER — CLONAZEPAM 1 MG PO TABS: 1.0000 mg | ORAL_TABLET | Freq: Three times a day (TID) | ORAL | Status: DC

## 2015-09-10 MED ORDER — DULOXETINE HCL 60 MG PO CPEP: 120.0000 mg | ORAL_CAPSULE | Freq: Every day | ORAL | Status: DC

## 2015-09-10 MED ORDER — TRAZODONE HCL 100 MG PO TABS: ORAL_TABLET | ORAL | Status: DC

## 2015-09-10 NOTE — Progress Notes (Signed)
[facility] MD Progress Note  09/10/2015 1:42 PM Jamie [name]  MRN:  [id number] Subjective:  Feeling well. Principal Problem: Severe major depression with psychotic features Diagnosis:  Severe major depression with psychotic features Today the patient is doing very well. She's here with her mother. Her mother recently moved in with her. They together for the last year is doing actually quite well. The patient is happy with life. Resume she's in no interpersonal relationships. She remembers her last relationship was very traumatic made her very depressed and she got suicidal. She is very cautious about starting in a relationship. The patient denies daily depression. She is  eating very well. She's got good energy. She can concentrate without any problems. She is a good sense of worth. She denies being suicidal at this time. She denies any psychotic symptoms at all. She is alert lucid very friendly very engaging and doing great. She's making a special room for her 3 cats. She takes her medicines just as prescribed. The patient has some mild pain he gets an injection into her back. The patient has polycystic ovarian disease and takes metformin for this condition. The patient does claim however that she is having some problems sleeping a full night. The patient says she wakes up about 3:00 and take it back to bed. He says it does make her sleepy during the day. He said that just began about a month ago. The patient cannot define any significant psychosocial stressors. Medically she feels well. She handled the holidays well. She handled the snowstorm well. Overall she's doing quite well except for some minor problems with sleep. Patient Active Problem List   Diagnosis Date Noted  . Severe major depression with psychotic features, mood-congruent (HCC) [F32.3] 05/03/2012    Priority: Medium  . Lumbar herniated disc [M51.26] 01/22/2014  . Major depressive disorder, recurrent episode, severe, specified as with  psychotic behavior [F33.3] 02/14/2013  . Headache [R51] 12/06/2012  . Bipolar I disorder, most recent episode (or current) depressed, unspecified [F31.30] 11/15/2012  . Bipolar affective disorder, depressed, mild degree (HCC) [F31.31] 08/16/2012  . Bipolar 1 disorder, depressed, mild (HCC) [F31.31] 08/16/2012  . Schizoaffective disorder, depressive type (HCC) [F25.1] 06/09/2011  . Generalized anxiety disorder [F41.1] 06/09/2011   Total Time spent with patient: 30 minutes  Past Psychiatric History:   Past Medical History:  Past Medical History  Diagnosis Date  . Hypertension   . High cholesterol   . Bipolar 1 disorder (HCC)   . Depression   . Anxiety   . Headache(784.0)   . Complication of anesthesia   . PONV (postoperative nausea and vomiting)     nausea  . PCOS (polycystic ovarian syndrome)   . GERD (gastroesophageal reflux disease)   . H/O hiatal hernia     Past Surgical History  Procedure Laterality Date  . Back surgery  2009  . Cholecystectomy  07/2002  . Anal fissure repair      x 3  . Knee arthroscopy Right   . Lumbar laminectomy/decompression microdiscectomy Right 01/22/2014    Procedure: LUMBAR FOUR TO FIVE LUMBAR LAMINECTOMY/DECOMPRESSION MICRODISCECTOMY 1 LEVEL;  Surgeon: Karn Cassis, MD;  Location: [facility];  Service: Neurosurgery;  Laterality: Right;  Right L45 diskectomy   Family History:  Family History  Problem Relation Age of Onset  . Anxiety disorder Maternal Grandmother   . Depression Maternal Grandmother    Family Psychiatric  History:  Social History:  History  Alcohol Use No     History  Drug Use No    Social History   Social History  . Marital Status: Single    Spouse Name: [name]  . Number of Children: 0  . Years of Education: college   Occupational History  . disabled    Social History Main Topics  . Smoking status: Never Smoker   . Smokeless tobacco: Never Used  . Alcohol Use: No  . Drug Use: No  . Sexual Activity: No    Other Topics Concern  . Not on file   Social History Narrative   Patient lives at home alone    Patient is right handed   Patient drinks soda's daily   Additional Social History:                         Sleep: Fair  Appetite:  Good  Current Medications: Current Outpatient Prescriptions  Medication Sig Dispense Refill  . acetaminophen (TYLENOL) 500 MG tablet Take 1,000 mg by mouth every 6 (six) hours as needed for mild pain or moderate pain.    [name] Kitchen buPROPion (WELLBUTRIN XL) 300 MG 24 hr tablet Take 1 tablet (300 mg total) by mouth daily. 90 tablet 1  . clonazePAM (KLONOPIN) 1 MG tablet Take 1 tablet (1 mg total) by mouth 3 (three) times daily. 90 tablet 5  . DULoxetine (CYMBALTA) 60 MG capsule Take 2 capsules (120 mg total) by mouth daily. 180 capsule 1  . lisinopril (PRINIVIL,ZESTRIL) 5 MG tablet Take 5 mg by mouth daily.    . metformin (FORTAMET) 500 MG (OSM) 24 hr tablet Take 1,500 mg by mouth daily with breakfast.    . omeprazole (PRILOSEC) 20 MG capsule Take 1 capsule (20 mg total) by mouth daily. 30 capsule 0  . rosuvastatin (CRESTOR) 20 MG tablet Take 20 mg by mouth daily.      [name] Kitchen topiramate (TOPAMAX) 50 MG tablet TAKE 1 TABLET (50 MG TOTAL) BY MOUTH 2 (TWO) TIMES DAILY. 60 tablet 11  . topiramate (TOPAMAX) 50 MG tablet Take 1 tablet (50 mg total) by mouth 2 (two) times daily. 180 tablet 3  . traZODone (DESYREL) 100 MG tablet 3  qhs 270 tablet 1  . ziprasidone (GEODON) 80 MG capsule Take 1 capsule (80 mg total) by mouth 2 (two) times daily with a meal. 180 capsule 1   No current facility-administered medications for this visit.    Lab Results: No results found for this or any previous visit (from the past 48 hour(s)).  Physical Findings: AIMS:  , ,  ,  ,    CIWA:    COWS:     Musculoskeletal: Strength & Muscle Tone: within normal limits Gait & Station: normal Patient leans: Right  Psychiatric Specialty Exam: ROS  Blood pressure 111/77, pulse 98, height   (1.6 m), weight 230 lb 9.6 oz (104.599 kg).Body mass index is 40.86 kg/(m^2).  General Appearance: Casual  Eye Contact::  Good  Speech:  Clear and Coherent  Volume:  Normal  Mood:  Euthymic  Affect:  Appropriate  Thought Process:  Coherent  Orientation:  Full (Time, Place, and Person)  Thought Content:  NA and WDL  Suicidal Thoughts:  No  Homicidal Thoughts:  No  Memory:  Immediate;   NA  Judgement:  Good  Insight:  Good  Psychomotor Activity:  NA  Concentration:  Good  Recall:  Good  Fund of Knowledge:Good  Language: Good  Akathisia:  No  Handed:  Right  AIMS (if indicated):  Assets:  Desire for Improvement  ADL's:  Intact  Cognition: WNL  Sleep:      Treatment Plan Summary: At this time the patient is doing well. Her first problem is that of depression with psychosis. Since taking her medicines regularly as she says she is no problems at all with her mood or hallucinations. She'll continue taking Geodon 80 mg twice a day. The patient actually is lost some weight because she try to control her diet done a good job. Patient also takes Cymbalta 120 mg which is used also for her back pain. She does also for her psychotic symptoms. Her first problem is that of clinical major depression with psychosis is well-controlled Geodon and Cymbalta. She also takes Wellbutrin for her depression. The patient will continue taking Klonopin 1 mg 3 at night which do help today we will adjust her trazodone from taking 200 mg total for dose of 300 mg. This patient is doing very well and return to see me in 3 months. She denies any chest pain or shortness of breath and she has no neurological symptoms at this time.  Lucas Mallow, MD 09/10/2015, 1:42 PM

## 2015-09-11 DIAGNOSIS — E781 Pure hyperglyceridemia: Secondary | ICD-10-CM | POA: Diagnosis not present

## 2015-09-11 DIAGNOSIS — I10 Essential (primary) hypertension: Secondary | ICD-10-CM | POA: Diagnosis not present

## 2015-09-11 DIAGNOSIS — E782 Mixed hyperlipidemia: Secondary | ICD-10-CM | POA: Diagnosis not present

## 2015-09-11 DIAGNOSIS — R739 Hyperglycemia, unspecified: Secondary | ICD-10-CM | POA: Diagnosis not present

## 2015-09-17 DIAGNOSIS — I10 Essential (primary) hypertension: Secondary | ICD-10-CM | POA: Diagnosis not present

## 2015-09-17 DIAGNOSIS — K219 Gastro-esophageal reflux disease without esophagitis: Secondary | ICD-10-CM | POA: Diagnosis not present

## 2015-09-17 DIAGNOSIS — M545 Low back pain: Secondary | ICD-10-CM | POA: Diagnosis not present

## 2015-09-17 DIAGNOSIS — E282 Polycystic ovarian syndrome: Secondary | ICD-10-CM | POA: Diagnosis not present

## 2015-09-30 DIAGNOSIS — M4806 Spinal stenosis, lumbar region: Secondary | ICD-10-CM | POA: Diagnosis not present

## 2015-09-30 DIAGNOSIS — M5416 Radiculopathy, lumbar region: Secondary | ICD-10-CM | POA: Diagnosis not present

## 2015-09-30 DIAGNOSIS — Z6841 Body Mass Index (BMI) 40.0 and over, adult: Secondary | ICD-10-CM | POA: Diagnosis not present

## 2015-10-29 DIAGNOSIS — R51 Headache: Secondary | ICD-10-CM | POA: Diagnosis not present

## 2015-12-10 ENCOUNTER — Ambulatory Visit (INDEPENDENT_AMBULATORY_CARE_PROVIDER_SITE_OTHER): Payer: Medicare Other | Admitting: Psychiatry

## 2015-12-10 ENCOUNTER — Encounter (HOSPITAL_COMMUNITY): Payer: Self-pay | Admitting: Psychiatry

## 2015-12-10 VITALS — BP 122/70 | HR 109 | Ht 62.0 in | Wt 216.6 lb

## 2015-12-10 DIAGNOSIS — F323 Major depressive disorder, single episode, severe with psychotic features: Secondary | ICD-10-CM | POA: Diagnosis not present

## 2015-12-10 DIAGNOSIS — F3342 Major depressive disorder, recurrent, in full remission: Secondary | ICD-10-CM

## 2015-12-10 MED ORDER — DULOXETINE HCL 60 MG PO CPEP: 120.0000 mg | ORAL_CAPSULE | Freq: Every day | ORAL | Status: DC

## 2015-12-10 MED ORDER — CLONAZEPAM 1 MG PO TABS: 1.0000 mg | ORAL_TABLET | Freq: Three times a day (TID) | ORAL | Status: DC

## 2015-12-10 MED ORDER — ZIPRASIDONE HCL 80 MG PO CAPS: 80.0000 mg | ORAL_CAPSULE | Freq: Two times a day (BID) | ORAL | Status: DC

## 2015-12-10 MED ORDER — TRAZODONE HCL 100 MG PO TABS: ORAL_TABLET | ORAL | Status: DC

## 2015-12-11 NOTE — Progress Notes (Signed)
Patient ID: Jamie [name], female   DOB: 10-18-1970, 45 y.o.   MRN: [id number] [facility] MD Progress Note  12/11/2015 3:05 PM Jamie [name]  MRN:  [id number] Subjective:  Feeling well. Principal Problem: Severe major depression with psychotic features Diagnosis:  Severe major depression with psychotic features Today the patient is doing well. She's in good spirits. Her mother still getting over some illness that she is recovering. The patient is eating and sleeping well. She takes her medicines as prescribed. She is a good amount of energy. She still enjoys her cats. The patient has a small social life but she is connected to one or 2 close friends. She demonstrates no evidence of psychosis. The patient denies chest pain or shortness of breath. She denies any neurological symptoms. The patient is active very engaging and very friendly. She is no complaints today she takes her medicine as prescribed. She is very compliant with her care. The patient is certainly not suicidal he is functioning very well. Total Time spent with patient: 30 minutes  Past Psychiatric History:   Past Medical History:  Past Medical History  Diagnosis Date  . Hypertension   . High cholesterol   . Bipolar 1 disorder (HCC)   . Depression   . Anxiety   . Headache(784.0)   . Complication of anesthesia   . PONV (postoperative nausea and vomiting)     nausea  . PCOS (polycystic ovarian syndrome)   . GERD (gastroesophageal reflux disease)   . H/O hiatal hernia     Past Surgical History  Procedure Laterality Date  . Back surgery  2009  . Cholecystectomy  07/2002  . Anal fissure repair      x 3  . Knee arthroscopy Right   . Lumbar laminectomy/decompression microdiscectomy Right 01/22/2014    Procedure: LUMBAR FOUR TO FIVE LUMBAR LAMINECTOMY/DECOMPRESSION MICRODISCECTOMY 1 LEVEL;  Surgeon: Karn Cassis, MD;  Location: [facility];  Service: Neurosurgery;  Laterality: Right;  Right L45 diskectomy   Family History:   Family History  Problem Relation Age of Onset  . Anxiety disorder Maternal Grandmother   . Depression Maternal Grandmother    Family Psychiatric  History:  Social History:  History  Alcohol Use No     History  Drug Use No    Social History   Social History  . Marital Status: Single    Spouse Name: [name]  . Number of Children: 0  . Years of Education: college   Occupational History  . disabled    Social History Main Topics  . Smoking status: Never Smoker   . Smokeless tobacco: Never Used  . Alcohol Use: No  . Drug Use: No  . Sexual Activity: No   Other Topics Concern  . None   Social History Narrative   Patient lives at home alone    Patient is right handed   Patient drinks soda's daily   Additional Social History:                         Sleep: Fair  Appetite:  Good  Current Medications: Current Outpatient Prescriptions  Medication Sig Dispense Refill  . acetaminophen (TYLENOL) 500 MG tablet Take 1,000 mg by mouth every 6 (six) hours as needed for mild pain or moderate pain.    [name] Kitchen buPROPion (WELLBUTRIN XL) 300 MG 24 hr tablet Take 1 tablet (300 mg total) by mouth daily. 90 tablet 1  . clonazePAM (KLONOPIN) 1 MG tablet  Take 1 tablet (1 mg total) by mouth 3 (three) times daily. 90 tablet 5  . DULoxetine (CYMBALTA) 60 MG capsule Take 2 capsules (120 mg total) by mouth daily. 180 capsule 1  . lisinopril (PRINIVIL,ZESTRIL) 5 MG tablet Take 5 mg by mouth daily.    . metformin (FORTAMET) 500 MG (OSM) 24 hr tablet Take 1,500 mg by mouth daily with breakfast.    . omeprazole (PRILOSEC) 20 MG capsule Take 1 capsule (20 mg total) by mouth daily. 30 capsule 0  . rosuvastatin (CRESTOR) 20 MG tablet Take 20 mg by mouth daily.      [name] Kitchen. topiramate (TOPAMAX) 50 MG tablet TAKE 1 TABLET (50 MG TOTAL) BY MOUTH 2 (TWO) TIMES DAILY. 60 tablet 11  . topiramate (TOPAMAX) 50 MG tablet Take 1 tablet (50 mg total) by mouth 2 (two) times daily. 180 tablet 3  . traZODone  (DESYREL) 100 MG tablet 3  qhs 270 tablet 1  . ziprasidone (GEODON) 80 MG capsule Take 1 capsule (80 mg total) by mouth 2 (two) times daily with a meal. 180 capsule 1   No current facility-administered medications for this visit.    Lab Results: No results found for this or any previous visit (from the past 48 hour(s)).  Physical Findings: AIMS:  , ,  ,  ,    CIWA:    COWS:     Musculoskeletal: Strength & Muscle Tone: within normal limits Gait & Station: normal Patient leans: Right  Psychiatric Specialty Exam: ROS  Blood pressure 122/70, pulse 109, height 5\' 2"  (1.575 m), weight 216 lb 9.6 oz (98.249 kg).Body mass index is 39.61 kg/(m^2).  General Appearance: Casual  Eye Contact::  Good  Speech:  Clear and Coherent  Volume:  Normal  Mood:  Euthymic  Affect:  Appropriate  Thought Process:  Coherent  Orientation:  Full (Time, Place, and Person)  Thought Content:  NA and WDL  Suicidal Thoughts:  No  Homicidal Thoughts:  No  Memory:  Immediate;   NA  Judgement:  Good  Insight:  Good  Psychomotor Activity:  NA  Concentration:  Good  Recall:  Good  Fund of Knowledge:Good  Language: Good  Akathisia:  No  Handed:  Right  AIMS (if indicated):     Assets:  Desire for Improvement  ADL's:  Intact  Cognition: WNL  Sleep:      Treatment Plan Summary: Today the patient is doing very well. She's very pleased with herself that she lost 20 pounds. Her mood is good. She takes her Geodon 80 mg twice a day on a regular basis. She takes her antidepressant Cymbalta which seems to help her pain to some degree. At this time the patient is not in any psychotherapy. She is functioning independently and is doing very well. She's involved in the care of her mother she lives with. At this time we'll continue all her medications. We did suggest the possibility of reducing her trazodone from 300 mg down to 100 mg and she'll consider that. Other natural continue all her medications as prescribed.  Today we spent more than 50% of the time talking about her medications her disease condition and she agreed to continue taking her medicines as prescribed.  Lucas MallowGerald Irving Latica Hohmann, MD 12/11/2015, 3:05 PM

## 2016-01-12 DIAGNOSIS — R739 Hyperglycemia, unspecified: Secondary | ICD-10-CM | POA: Diagnosis not present

## 2016-01-12 DIAGNOSIS — E782 Mixed hyperlipidemia: Secondary | ICD-10-CM | POA: Diagnosis not present

## 2016-01-12 DIAGNOSIS — K219 Gastro-esophageal reflux disease without esophagitis: Secondary | ICD-10-CM | POA: Diagnosis not present

## 2016-01-12 DIAGNOSIS — I10 Essential (primary) hypertension: Secondary | ICD-10-CM | POA: Diagnosis not present

## 2016-01-14 DIAGNOSIS — K219 Gastro-esophageal reflux disease without esophagitis: Secondary | ICD-10-CM | POA: Diagnosis not present

## 2016-01-14 DIAGNOSIS — I10 Essential (primary) hypertension: Secondary | ICD-10-CM | POA: Diagnosis not present

## 2016-01-14 DIAGNOSIS — E282 Polycystic ovarian syndrome: Secondary | ICD-10-CM | POA: Diagnosis not present

## 2016-01-14 DIAGNOSIS — M545 Low back pain: Secondary | ICD-10-CM | POA: Diagnosis not present

## 2016-01-14 DIAGNOSIS — F331 Major depressive disorder, recurrent, moderate: Secondary | ICD-10-CM | POA: Diagnosis not present

## 2016-01-14 DIAGNOSIS — E782 Mixed hyperlipidemia: Secondary | ICD-10-CM | POA: Diagnosis not present

## 2016-01-14 DIAGNOSIS — Z1389 Encounter for screening for other disorder: Secondary | ICD-10-CM | POA: Diagnosis not present

## 2016-05-04 ENCOUNTER — Ambulatory Visit (INDEPENDENT_AMBULATORY_CARE_PROVIDER_SITE_OTHER): Payer: Medicare Other | Admitting: Nurse Practitioner

## 2016-05-04 ENCOUNTER — Encounter: Payer: Self-pay | Admitting: Nurse Practitioner

## 2016-05-04 VITALS — BP 114/81 | HR 87 | Ht 62.0 in | Wt 219.2 lb

## 2016-05-04 DIAGNOSIS — R519 Headache, unspecified: Secondary | ICD-10-CM

## 2016-05-04 DIAGNOSIS — R400 Somnolence: Secondary | ICD-10-CM | POA: Diagnosis not present

## 2016-05-04 DIAGNOSIS — R51 Headache: Secondary | ICD-10-CM | POA: Diagnosis not present

## 2016-05-04 MED ORDER — TOPIRAMATE 50 MG PO TABS: 50.0000 mg | ORAL_TABLET | Freq: Two times a day (BID) | ORAL | 3 refills | Status: DC

## 2016-05-04 NOTE — Patient Instructions (Addendum)
Continue Topamax at current dose will refill for one year Will obtain sleep study  Follow-up 6 months

## 2016-05-04 NOTE — Progress Notes (Signed)
[facility]  PATIENT: Jamie [name] DOB: [birth year]   REASON FOR VISIT: episodic headache, morning headaches  HISTORY FROM:patient    HISTORY OF PRESENT ILLNESS:Ms. Jamie [name], 45 year old female returns for yearly followup. She has a history of headaches. .Headaches  are in fair  control. She is having 5-6 headaches per month usually waking up with headache. She also has daytime drowsiness. She is currently taking Topamax 50 twice a day. She has significant bipolar disorder and sees psychiatry Dr. Donell Beers. She also has a past medical history of hypertension, , depression and anxiety, hyperlipidemia, chronic low back pain, lumbar laminectomy in June 2016  by Dr. Jeral Fruit. She also has a history of polycystic disease and obesity. She denies any motor or sensory deficits. MRI and MRV of the brain in the past have been normal.  She returns for refill and reevaluation . She claims she had a sleep study years ago was negative    REVIEW OF SYSTEMS: Full 14 system review of systems performed and notable only for those listed, all others are neg:  Constitutional: neg  Cardiovascular: neg Ear/Nose/Throat: neg  Skin: neg Eyes: Blurred vision Respiratory: neg Gastroitestinal: neg  Hematology/Lymphatic: neg  Endocrine: neg Musculoskeletal:back pain Allergy/Immunology: neg Neurological: neg Psychiatric: Depression anxiety, bipolar disorder Sleep : Daytime somnolence   ALLERGIES: Allergies  Allergen Reactions  . Codeine Hives and Swelling  . Morphine And Related Swelling and Rash    7/2: Pt state is OK in small doses and is willing to take Morphine this admission    HOME MEDICATIONS: Outpatient Medications Prior to Visit  Medication Sig Dispense Refill  . acetaminophen (TYLENOL) 500 MG tablet Take 1,000 mg by mouth every 6 (six) hours as needed for mild pain or moderate pain.    [name] Kitchen buPROPion (WELLBUTRIN XL) 300 MG 24 hr tablet Take 1 tablet (300 mg total) by mouth  daily. 90 tablet 1  . clonazePAM (KLONOPIN) 1 MG tablet Take 1 tablet (1 mg total) by mouth 3 (three) times daily. 90 tablet 5  . DULoxetine (CYMBALTA) 60 MG capsule Take 2 capsules (120 mg total) by mouth daily. 180 capsule 1  . lisinopril (PRINIVIL,ZESTRIL) 5 MG tablet Take 5 mg by mouth daily.    . metformin (FORTAMET) 500 MG (OSM) 24 hr tablet Take 1,500 mg by mouth daily with breakfast.    . omeprazole (PRILOSEC) 20 MG capsule Take 1 capsule (20 mg total) by mouth daily. 30 capsule 0  . rosuvastatin (CRESTOR) 20 MG tablet Take 20 mg by mouth daily.      [name] Kitchen topiramate (TOPAMAX) 50 MG tablet TAKE 1 TABLET (50 MG TOTAL) BY MOUTH 2 (TWO) TIMES DAILY. 60 tablet 11  . topiramate (TOPAMAX) 50 MG tablet Take 1 tablet (50 mg total) by mouth 2 (two) times daily. 180 tablet 3  . traZODone (DESYREL) 100 MG tablet 3  qhs 270 tablet 1  . ziprasidone (GEODON) 80 MG capsule Take 1 capsule (80 mg total) by mouth 2 (two) times daily with a meal. 180 capsule 1   No facility-administered medications prior to visit.     PAST MEDICAL HISTORY: Past Medical History:  Diagnosis Date  . Anxiety   . Bipolar 1 disorder (HCC)   . Complication of anesthesia   . Depression   . GERD (gastroesophageal reflux disease)   . H/O hiatal hernia   . Headache(784.0)   . High cholesterol   . Hypertension   . PCOS (polycystic ovarian syndrome)   . PONV (postoperative  nausea and vomiting)    nausea    PAST SURGICAL HISTORY: Past Surgical History:  Procedure Laterality Date  . ANAL FISSURE REPAIR     x 3  . BACK SURGERY  2009  . CHOLECYSTECTOMY  07/2002  . KNEE ARTHROSCOPY Right   . LUMBAR LAMINECTOMY/DECOMPRESSION MICRODISCECTOMY Right 01/22/2014   Procedure: LUMBAR FOUR TO FIVE LUMBAR LAMINECTOMY/DECOMPRESSION MICRODISCECTOMY 1 LEVEL;  Surgeon: Karn Cassis, MD;  Location: [facility];  Service: Neurosurgery;  Laterality: Right;  Right L45 diskectomy    FAMILY HISTORY: Family History  Problem Relation Age  of Onset  . Anxiety disorder Maternal Grandmother   . Depression Maternal Grandmother     SOCIAL HISTORY: Social History   Social History  . Marital status: Single    Spouse name: [name]  . Number of children: 0  . Years of education: college   Occupational History  . disabled    Social History Main Topics  . Smoking status: Never Smoker  . Smokeless tobacco: Never Used  . Alcohol use No  . Drug use: No  . Sexual activity: No   Other Topics Concern  . Not on file   Social History Narrative   Patient lives at home alone    Patient is right handed   Patient drinks soda's daily     PHYSICAL EXAM  Vitals:   05/04/16 1349  BP: 114/81  Pulse: 87  Weight: 219 lb 3.2 oz (99.4 kg)  Height: 5\' 2"  (1.575 m)   Body mass index is 40.09 kg/m. General: well developed, obese female seated, in no evident distress  Head: head normocephalic and atraumatic. Oropharynx benign  Neck: supple with no carotid  bruits  Cardiovascular: regular rate and rhythm, no murmurs  Neurologic Exam  Mental Status: Awake and fully alert. Oriented to place and time. Follows all commands, speech and language are normal ESS 12 Cranial Nerves: Pupils equal, briskly reactive to light. Extraocular movements full without nystagmus. Visual fields full to confrontation. Hearing intact and symmetric to finger snap. Facial sensation intact. Face, tongue, palate move normally and symmetrically. Neck flexion and extension normal.  Motor: Normal bulk and tone. Normal strength in all tested extremity muscles. No focal weakness  Sensory.: intact to touch and pinprick and vibratory in the upper and lower extremities.  Coordination: Rapid alternating movements normal in all extremities. Finger-to-nose and heel-to-shin performed accurately bilaterally.  Gait and Station: Arises from chair without difficulty. Stance is normal. Gait demonstrates normal stride length and balance . Able to heel, toe and tandem walk  without difficulty.  Reflexes: 2+ and symmetric. Toes downgoing.   DIAGNOSTIC DATA (LABS, IMAGING, TESTING)  ASSESSMENT AND PLAN  45 y.o. year old female  has a past medical history of Hypertension; High cholesterol; Bipolar 1 disorder (HCC); Depression; Anxiety; Headache(784.0); PCOS (polycystic ovarian syndrome); here to follow up. Her headaches are in fair control  on Topamax. She has a new complaint of morning headaches and daytime somnolence. ESS 12, BMI 40.09  PLAN:Continue Topamax at current dose will refill for one year Will obtain sleep study  I explained in particular the risks and ramifications of untreated moderate to severe OSA, especially with respect to cardiovascular disease  including congestive heart failure, difficult to treat hypertension, cardiac arrhythmias, or stroke. Even type 2 diabetes has, in part, been linked to untreated OSA. Symptoms of untreated OSA include daytime sleepiness, memory problems, mood irritability and mood disorder such as depression and anxiety, lack of energy, as well as recurrent  headaches, especially morning headaches. We talked about trying to maintain a healthy lifestyle in general, as well as the importance of weight control. She was congratulated for her 30 pound weight loss I encouraged the patient to eat healthy, exercise daily and keep well hydrated, to keep a scheduled bedtime and wake time routine, to not skip any meals and eat healthy snacks in between meals Follow-up 6 months Vst time 25 min Nilda RiggsNancy Carolyn Tinsley Lomas, [facility] HospitalGNP, [facility] LtdBC, APRN  [facility] [address], [address] [place], [place] [address] [phone]

## 2016-05-12 ENCOUNTER — Ambulatory Visit (HOSPITAL_COMMUNITY): Payer: Self-pay | Admitting: Psychiatry

## 2016-05-14 NOTE — Progress Notes (Signed)
I have reviewed and agreed above plan. 

## 2016-05-17 ENCOUNTER — Institutional Professional Consult (permissible substitution): Payer: Medicare Other | Admitting: Neurology

## 2016-05-19 DIAGNOSIS — E781 Pure hyperglyceridemia: Secondary | ICD-10-CM | POA: Diagnosis not present

## 2016-05-19 DIAGNOSIS — R739 Hyperglycemia, unspecified: Secondary | ICD-10-CM | POA: Diagnosis not present

## 2016-05-19 DIAGNOSIS — E782 Mixed hyperlipidemia: Secondary | ICD-10-CM | POA: Diagnosis not present

## 2016-05-19 DIAGNOSIS — I10 Essential (primary) hypertension: Secondary | ICD-10-CM | POA: Diagnosis not present

## 2016-05-19 DIAGNOSIS — F331 Major depressive disorder, recurrent, moderate: Secondary | ICD-10-CM | POA: Diagnosis not present

## 2016-05-19 DIAGNOSIS — K219 Gastro-esophageal reflux disease without esophagitis: Secondary | ICD-10-CM | POA: Diagnosis not present

## 2016-05-21 DIAGNOSIS — M545 Low back pain: Secondary | ICD-10-CM | POA: Diagnosis not present

## 2016-05-21 DIAGNOSIS — Z23 Encounter for immunization: Secondary | ICD-10-CM | POA: Diagnosis not present

## 2016-05-21 DIAGNOSIS — I10 Essential (primary) hypertension: Secondary | ICD-10-CM | POA: Diagnosis not present

## 2016-05-21 DIAGNOSIS — K219 Gastro-esophageal reflux disease without esophagitis: Secondary | ICD-10-CM | POA: Diagnosis not present

## 2016-05-21 DIAGNOSIS — E782 Mixed hyperlipidemia: Secondary | ICD-10-CM | POA: Diagnosis not present

## 2016-05-21 DIAGNOSIS — E282 Polycystic ovarian syndrome: Secondary | ICD-10-CM | POA: Diagnosis not present

## 2016-05-25 ENCOUNTER — Ambulatory Visit (INDEPENDENT_AMBULATORY_CARE_PROVIDER_SITE_OTHER): Payer: Medicaid Other | Admitting: Psychiatry

## 2016-05-25 ENCOUNTER — Encounter (HOSPITAL_COMMUNITY): Payer: Self-pay | Admitting: Psychiatry

## 2016-05-25 VITALS — BP 104/66 | HR 104 | Ht 63.0 in | Wt 215.6 lb

## 2016-05-25 DIAGNOSIS — F333 Major depressive disorder, recurrent, severe with psychotic symptoms: Secondary | ICD-10-CM | POA: Diagnosis not present

## 2016-05-25 DIAGNOSIS — F323 Major depressive disorder, single episode, severe with psychotic features: Secondary | ICD-10-CM

## 2016-05-25 DIAGNOSIS — Z818 Family history of other mental and behavioral disorders: Secondary | ICD-10-CM | POA: Diagnosis not present

## 2016-05-25 DIAGNOSIS — Z79899 Other long term (current) drug therapy: Secondary | ICD-10-CM

## 2016-05-25 MED ORDER — DULOXETINE HCL 60 MG PO CPEP: 120.0000 mg | ORAL_CAPSULE | Freq: Every day | ORAL | 7 refills | Status: DC

## 2016-05-25 MED ORDER — BUPROPION HCL ER (XL) 300 MG PO TB24: 300.0000 mg | ORAL_TABLET | Freq: Every day | ORAL | 8 refills | Status: DC

## 2016-05-25 MED ORDER — ZIPRASIDONE HCL 80 MG PO CAPS: 80.0000 mg | ORAL_CAPSULE | Freq: Two times a day (BID) | ORAL | 8 refills | Status: DC

## 2016-05-25 MED ORDER — TRAZODONE HCL 100 MG PO TABS: ORAL_TABLET | ORAL | 8 refills | Status: DC

## 2016-05-25 MED ORDER — CLONAZEPAM 1 MG PO TABS: 1.0000 mg | ORAL_TABLET | Freq: Three times a day (TID) | ORAL | 5 refills | Status: DC

## 2016-05-25 MED ORDER — TRAZODONE HCL 100 MG PO TABS: ORAL_TABLET | ORAL | 1 refills | Status: DC

## 2016-05-25 NOTE — Progress Notes (Signed)
Patient ID: Jamie [name], female   DOB: 1970-10-29, 45 y.o.   MRN: [id number] [facility] MD Progress Note  05/25/2016 3:24 PM Jamie [name]  MRN:  [id number] Subjective:  Feeling well. Principal Problem: Past Psychiatric History:  Today the patient is doing well. She did have a trauma and that her aunt who lives next door at age 61 died suddenly. She apparently was not medically well but her death was unexpected. The funeral or any occur 2 weeks ago the patient did well. The patient's mother who is the sister of her aunt is doing well. Noted is the patient does actually have a boyfriend since March of this year. She's never met him personally but she communicated or Line. He is in the [employer]. In the next few months he'll call back to this country.at this time the patient's mood is good. She denies daily depression. She is sleeping and eating well. She's got good energy. She is a good sense of worth. She is not suicidal. She enjoys her cats and quilting. She enjoys TV she likes to read. She likes fiction and not infection. The patient denies any symptoms of psychosis. She denies any paranoia. Patient denies use of alcohol or drugs. The patient is lost weight by reducing her intake of food but she does not really exercise. The patient continues in psychotherapy with [facility]. This interview she seen for 5 years and she's been psychiatrically discharged from hospital. Patient is actually very stable. She continues to lose weight her efforts. She's got good energy. Today the patient had an aims scale which demonstrated no evidence of tardive dyskinesia. Past Medical History:  Past Medical History:  Diagnosis Date  . Anxiety   . Bipolar 1 disorder ([place])   . Complication of anesthesia   . Depression   . GERD (gastroesophageal reflux disease)   . H/O hiatal hernia   . Headache(784.0)   . High cholesterol   . Hypertension   . PCOS (polycystic ovarian syndrome)   . PONV (postoperative nausea and  vomiting)    nausea    Past Surgical History:  Procedure Laterality Date  . ANAL FISSURE REPAIR     x 3  . BACK SURGERY  2009  . CHOLECYSTECTOMY  07/2002  . KNEE ARTHROSCOPY Right   . LUMBAR LAMINECTOMY/DECOMPRESSION MICRODISCECTOMY Right 01/22/2014   Procedure: LUMBAR FOUR TO FIVE LUMBAR LAMINECTOMY/DECOMPRESSION MICRODISCECTOMY 1 LEVEL;  Surgeon: Floyce Stakes, MD;  Location: [place] [facility];  Service: Neurosurgery;  Laterality: Right;  Right L45 diskectomy   Family History:  Family History  Problem Relation Age of Onset  . Anxiety disorder Maternal Grandmother   . Depression Maternal Grandmother    Family Psychiatric  History:  Social History:  History  Alcohol Use No     History  Drug Use No    Social History   Social History  . Marital status: Single    Spouse name: [name]  . Number of children: 0  . Years of education: college   Occupational History  . disabled    Social History Main Topics  . Smoking status: Never Smoker  . Smokeless tobacco: Never Used  . Alcohol use No  . Drug use: No  . Sexual activity: No   Other Topics Concern  . None   Social History Narrative   Patient lives at home alone    Patient is right handed   Patient drinks soda's daily   Additional Social History:  Sleep: Fair  Appetite:  Good  Current Medications: Current Outpatient Prescriptions  Medication Sig Dispense Refill  . acetaminophen (TYLENOL) 500 MG tablet Take 1,000 mg by mouth every 6 (six) hours as needed for mild pain or moderate pain.    [name] Kitchen buPROPion (WELLBUTRIN XL) 300 MG 24 hr tablet Take 1 tablet (300 mg total) by mouth daily. 90 tablet 8  . clonazePAM (KLONOPIN) 1 MG tablet Take 1 tablet (1 mg total) by mouth 3 (three) times daily. 90 tablet 5  . DULoxetine (CYMBALTA) 60 MG capsule Take 2 capsules (120 mg total) by mouth daily. 180 capsule 7  . lisinopril (PRINIVIL,ZESTRIL) 5 MG tablet Take 5 mg by mouth daily.    . metformin  (FORTAMET) 500 MG (OSM) 24 hr tablet Take 1,500 mg by mouth daily with breakfast.    . omeprazole (PRILOSEC) 20 MG capsule Take 1 capsule (20 mg total) by mouth daily. 30 capsule 0  . rosuvastatin (CRESTOR) 20 MG tablet Take 20 mg by mouth daily.      [name] Kitchen topiramate (TOPAMAX) 50 MG tablet Take 1 tablet (50 mg total) by mouth 2 (two) times daily. 180 tablet 3  . traZODone (DESYREL) 100 MG tablet 3  qhs 270 tablet 1  . traZODone (DESYREL) 100 MG tablet 3  qhs 270 tablet 8  . ziprasidone (GEODON) 80 MG capsule Take 1 capsule (80 mg total) by mouth 2 (two) times daily with a meal. 180 capsule 8   No current facility-administered medications for this visit.     Lab Results: No results found for this or any previous visit (from the past 48 hour(s)).  Physical Findings: AIMS:  , ,  ,  ,    CIWA:    COWS:     Musculoskeletal: Strength & Muscle Tone: within normal limits Gait & Station: normal Patient leans: Right  Psychiatric Specialty Exam: ROS  Blood pressure 104/66, pulse (!) 104, height '5\' 3"'  (1.6 m), weight 215 lb 9.6 oz (97.8 kg).Body mass index is 38.19 kg/m.  General Appearance: Casual  Eye Contact::  Good  Speech:  Clear and Coherent  Volume:  Normal  Mood:  Euthymic  Affect:  Appropriate  Thought Process:  Coherent  Orientation:  Full (Time, Place, and Person)  Thought Content:  NA and WDL  Suicidal Thoughts:  No  Homicidal Thoughts:  No  Memory:  Immediate;   NA  Judgement:  Good  Insight:  Good  Psychomotor Activity:  NA  Concentration:  Good  Recall:  Good  Fund of Knowledge:Good  Language: Good  Akathisia:  No  Handed:  Right  AIMS (if indicated):     Assets:  Desire for Improvement  ADL's:  Intact  Cognition: WNL  Sleep:      Treatment Plan Summary: At this timethis patient lives with her mother. She had her mother move in. Patient is tolerating the death of her aunt who she was close with fairly well. Her problem of breathing seems to be relatively  controlled by being in therapy at this time. The patient continues taking Geodon 80 mg twice a day. She continues on Cymbalta 60 mg 2 a day. She continues taking trazodone 300 mg which helps her sleep a great deal. The patient takes Klonopin 1 mg 3 times a day. The patient is #1 problem is clinical depression with psychosis. The patient shows no evidence of psychosis at this time her mood is well-controlled. The patient demonstrates significant resilience in that she's not gotten depressed  because her aunt's death. The patient is maintaining a relationship through the computer was some individual who she'll likely meet next 6 months. Generally she is enjoying life. Patient will continue taking her medicines as prescribed. She'll return to see me in 4 months for a 30 minute visit.  Haskel Schroeder, MD 05/25/2016, 3:24 PM

## 2016-06-01 ENCOUNTER — Encounter: Payer: Self-pay | Admitting: Neurology

## 2016-06-01 ENCOUNTER — Ambulatory Visit (INDEPENDENT_AMBULATORY_CARE_PROVIDER_SITE_OTHER): Payer: Medicare Other | Admitting: Neurology

## 2016-06-01 VITALS — BP 110/80 | HR 92 | Resp 20 | Ht 63.0 in | Wt 215.0 lb

## 2016-06-01 DIAGNOSIS — R0683 Snoring: Secondary | ICD-10-CM | POA: Diagnosis not present

## 2016-06-01 DIAGNOSIS — G4733 Obstructive sleep apnea (adult) (pediatric): Secondary | ICD-10-CM

## 2016-06-01 DIAGNOSIS — G4719 Other hypersomnia: Secondary | ICD-10-CM | POA: Diagnosis not present

## 2016-06-01 DIAGNOSIS — R51 Headache: Secondary | ICD-10-CM | POA: Diagnosis not present

## 2016-06-01 DIAGNOSIS — R519 Headache, unspecified: Secondary | ICD-10-CM

## 2016-06-01 NOTE — Patient Instructions (Signed)
Please remember to try to maintain good sleep hygiene, which means: Keep a regular sleep and wake schedule, try not to exercise or have a meal within 2 hours of your bedtime, try to keep your bedroom conducive for sleep, that is, cool and dark, without light distractors such as an illuminated alarm clock, and refrain from watching TV right before sleep or in the middle of the night and do not keep the TV or radio on during the night. Also, try not to use or play on electronic devices at bedtime, such as your cell phone, tablet PC or laptop. If you like to read at bedtime on an electronic device, try to dim the background light as much as possible. Do not eat in the middle of the night.   We will request a sleep study.    We will look for leg twitching and snoring or sleep apnea.   For chronic insomnia, you are best followed by a psychiatrist and/or sleep psychologist.   We will call you with the sleep study results and make a follow up appointment if needed.     Insomnia Insomnia is a sleep disorder that makes it difficult to fall asleep or to stay asleep. Insomnia can cause tiredness (fatigue), low energy, difficulty concentrating, mood swings, and poor performance at work or school.  There are three different ways to classify insomnia:  Difficulty falling asleep.  Difficulty staying asleep.  Waking up too early in the morning. Any type of insomnia can be long-term (chronic) or short-term (acute). Both are common. Short-term insomnia usually lasts for three months or less. Chronic insomnia occurs at least three times a week for longer than three months. CAUSES  Insomnia may be caused by another condition, situation, or substance, such as:  Anxiety.  Certain medicines.  Gastroesophageal reflux disease (GERD) or other gastrointestinal conditions.  Asthma or other breathing conditions.  Restless legs syndrome, sleep apnea, or other sleep disorders.  Chronic pain.  Menopause.  This may include hot flashes.  Stroke.  Abuse of alcohol, tobacco, or illegal drugs.  Depression.  Caffeine.   Neurological disorders, such as Alzheimer disease.  An overactive thyroid (hyperthyroidism). The cause of insomnia may not be known. RISK FACTORS Risk factors for insomnia include:  Gender. Women are more commonly affected than men.  Age. Insomnia is more common as you get older.  Stress. This may involve your professional or personal life.  Income. Insomnia is more common in people with lower income.  Lack of exercise.   Irregular work schedule or night shifts.  Traveling between different time zones. SIGNS AND SYMPTOMS If you have insomnia, trouble falling asleep or trouble staying asleep is the main symptom. This may lead to other symptoms, such as:  Feeling fatigued.  Feeling nervous about going to sleep.  Not feeling rested in the morning.  Having trouble concentrating.  Feeling irritable, anxious, or depressed. TREATMENT  Treatment for insomnia depends on the cause. If your insomnia is caused by an underlying condition, treatment will focus on addressing the condition. Treatment may also include:   Medicines to help you sleep.  Counseling or therapy.  Lifestyle adjustments. HOME CARE INSTRUCTIONS   Take medicines only as directed by your health care provider.  Keep regular sleeping and waking hours. Avoid naps.  Keep a sleep diary to help you and your health care provider figure out what could be causing your insomnia. Include:   When you sleep.  When you wake up during the night.    How well you sleep.   How rested you feel the next day.  Any side effects of medicines you are taking.  What you eat and drink.   Make your bedroom a comfortable place where it is easy to fall asleep:  Put up shades or special blackout curtains to block light from outside.  Use a white noise machine to block noise.  Keep the temperature cool.    Exercise regularly as directed by your health care provider. Avoid exercising right before bedtime.  Use relaxation techniques to manage stress. Ask your health care provider to suggest some techniques that may work well for you. These may include:  Breathing exercises.  Routines to release muscle tension.  Visualizing peaceful scenes.  Cut back on alcohol, caffeinated beverages, and cigarettes, especially close to bedtime. These can disrupt your sleep.  Do not overeat or eat spicy foods right before bedtime. This can lead to digestive discomfort that can make it hard for you to sleep.  Limit screen use before bedtime. This includes:  Watching TV.  Using your smartphone, tablet, and computer.  Stick to a routine. This can help you fall asleep faster. Try to do a quiet activity, brush your teeth, and go to bed at the same time each night.  Get out of bed if you are still awake after 15 minutes of trying to sleep. Keep the lights down, but try reading or doing a quiet activity. When you feel sleepy, go back to bed.  Make sure that you drive carefully. Avoid driving if you feel very sleepy.  Keep all follow-up appointments as directed by your health care provider. This is important. SEEK MEDICAL CARE IF:   You are tired throughout the day or have trouble in your daily routine due to sleepiness.  You continue to have sleep problems or your sleep problems get worse. SEEK IMMEDIATE MEDICAL CARE IF:   You have serious thoughts about hurting yourself or someone else.   This information is not intended to replace advice given to you by your health care provider. Make sure you discuss any questions you have with your health care provider.   Document Released: 07/09/2000 Document Revised: 04/02/2015 Document Reviewed: 04/12/2014 Elsevier Interactive Patient Education 2016 Elsevier Inc[employer]  

## 2016-06-01 NOTE — Progress Notes (Signed)
SLEEP MEDICINE CLINIC   Provider:  Melvyn Novasarmen  Birtie Fellman, M D  Referring Provider:  Dr. Terrace ArabiaYan / Darrol Angelarolyn Martin, NP  Primary Care Physician:  Estanislado PandySASSER,PAUL W, MD  Chief Complaint  Patient presents with  . New Patient (Initial Visit)    has had sleep study 10 years ago    HPI:  Jamie BryantMelinda K [name] is a 45 y.o. female , seen here as a referral from Dr. Terrace ArabiaYan and Darrol Angelarolyn Martin, NP,  [name] has been established patient of Dr. Zannie CoveYan's whom she sees yearly for follow-up. She has a history of chronic headaches which are only fairly controlled. Usually 5-6 times a month she wakes up with a headache. She also has daytime excessive sleepiness endorsing the Epworth score at 13 points, she is currently treated with Topamax 50 mg twice a day and she is treated for bipolar disorder by Dr. Donell BeersPlovsky,  further carries a diagnosis of hypertension, anxiety, hyperlipidemia, morbid obesity, chronic lower back pain, status post lumbar laminectomy in June 2016 performed by Dr. Hilda LiasErnesto Botero. She has a history of polycystic ovarian disease. Recent MRIs and MR venogram of the brain have been normal she saw Rayfield CitizenCaroline last on 05/04/2016 and received refills for her medications. She is here today to be evaluated for possible sleep apnea-insomnia.  She has primary insomnia having trouble to go to sleep as well as having trouble staying asleep. This is a chronic insomnia it has been present for longer than 10 years, and has been associated with her bipolar disorder. Dr. Olevia BowensPoplawski is treating her for it. She has been prescribed trazodone helps with sleep initiation but it doesn't help as well with sleep maintenance. She is also treated with Geodon, Topamax, Prilosec, Crestor, metformin, Cymbalta, Klonopin, Wellbutrin, Tylenol, and Prinivil.   Sleep habits are as follows: She goes to bed as early as 7:30 PM and is usually asleep within 2 hours after taking trazodone. She usually watches TV before going to the bedroom. Her bedroom as core,  quiet and dark, she sleeps on her sides, she is using one pillow for head support. She does not complain of heartburn or acid reflux, since taking medicine for it. She wakes up at night twice to go to the bathroom, sometimes she cannot go back to sleep. She does not wake up from pain or headaches, but she also wakes up with a feeling of shortness of breath , breath holding spells. She rises in the morning at about 7 AM her dog usually wakes her at that time. She does have headaches and a dry mouth usually in the morning at that time. Usually she has to take medication for the headache and the headaches will resolve as the day goes on. Usually she feels energized, refreshed and restored in the mornings. She was told by friends and family that she snores loudly, that she talks in her sleep, sometimes stops to breath - she is not known to act out or is restless in her sleep,she has never slept walked.   Sleep medical history and family sleep history: Her father suffered from sleep apnea, Mrs. [name] was evaluated over 6 years ago in a sleep study - referred by Dr. Terrace ArabiaYan on 03/19/2010. At the time she had a BMI of 43, Becks inventory endorsed at 22 points, and slept for 90% of the recorded time. There was no significant apnea noted the overall AHI was 0.1 and the oxygen nadir 92, there were no PLMS noted. No follow up. Insomnia was not  verified.  Social history: single, living with her widowed mother, the patient is on disability. 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 She does not use tobacco products, does not use are cool, she does not drink caffeine in any form. He does not eat chocolates.  [name] referral nore :  45 y.o. year old female  has a past medical history of Hypertension; High cholesterol; Bipolar 1 disorder ([facility]); Depression; Anxiety; Headache(784.0); PCOS (polycystic ovarian syndrome); here to follow up. Her headaches are in fair control  on Topamax. She has a new  complaint of morning headaches and daytime somnolence. ESS 12, BMI 40.09  PLAN:Continue Topamax at current dose will refill for one year Will obtain sleep study  I explained in particular the risks and ramifications of untreated moderate to severe OSA, especially with respect to cardiovascular disease  including congestive heart failure, difficult to treat hypertension, cardiac arrhythmias, or stroke. Even type 2 diabetes has, in part, been linked to untreated OSA. Symptoms of untreated OSA include daytime sleepiness, memory problems, mood irritability and mood disorder such as depression and anxiety, lack of energy, as well as recurrent headaches, especially morning headaches. We talked about trying to maintain a healthy lifestyle in general, as well as the importance of weight control. She was congratulated for her 30 pound weight loss I encouraged the patient to eat healthy, exercise daily and keep well hydrated, to keep a scheduled bedtime and wake time routine, to not skip any meals and eat healthy snacks in between meals   Review of Systems: Out of a complete 14 system review, the patient complains of only the following symptoms, and all other reviewed systems are negative.   Epworth score 13  Fatigue severity score 56  , depression score n/a . See Dr.  Caprice Renshaw reports.   Social History   Social History  . Marital status: Single    Spouse name: [name]  . Number of children: 0  . Years of education: college   Occupational History  . disabled    Social History Main Topics  . Smoking status: Never Smoker  . Smokeless tobacco: Never Used  . Alcohol use No  . Drug use: No  . Sexual activity: No   Other Topics Concern  . Not on file   Social History Narrative   Patient lives at home alone    Patient is right handed   Patient drinks soda's daily    Family History  Problem Relation Age of Onset  . Anxiety disorder Maternal Grandmother   . Depression Maternal Grandmother      Past Medical History:  Diagnosis Date  . Anxiety   . Bipolar 1 disorder ([facility])   . Complication of anesthesia   . Depression   . GERD (gastroesophageal reflux disease)   . H/O hiatal hernia   . Headache(784.0)   . High cholesterol   . Hypertension   . PCOS (polycystic ovarian syndrome)   . PONV (postoperative nausea and vomiting)    nausea    Past Surgical History:  Procedure Laterality Date  . ANAL FISSURE REPAIR     x 3  . BACK SURGERY  2009  . CHOLECYSTECTOMY  07/2002  . KNEE ARTHROSCOPY Right   . LUMBAR LAMINECTOMY/DECOMPRESSION MICRODISCECTOMY Right 01/22/2014   Procedure: LUMBAR FOUR TO FIVE LUMBAR LAMINECTOMY/DECOMPRESSION MICRODISCECTOMY 1 LEVEL;  Surgeon: Karn Cassis, MD;  Location: [facility];  Service: Neurosurgery;  Laterality: Right;  Right L45 diskectomy    Current  Outpatient Prescriptions  Medication Sig Dispense Refill  . acetaminophen (TYLENOL) 500 MG tablet Take 1,000 mg by mouth every 6 (six) hours as needed for mild pain or moderate pain.    [name] Kitchen. buPROPion (WELLBUTRIN XL) 300 MG 24 hr tablet Take 1 tablet (300 mg total) by mouth daily. 90 tablet 8  . clonazePAM (KLONOPIN) 1 MG tablet Take 1 tablet (1 mg total) by mouth 3 (three) times daily. 90 tablet 5  . DULoxetine (CYMBALTA) 60 MG capsule Take 2 capsules (120 mg total) by mouth daily. 180 capsule 7  . lisinopril (PRINIVIL,ZESTRIL) 5 MG tablet Take 5 mg by mouth daily.    . metformin (FORTAMET) 500 MG (OSM) 24 hr tablet Take 1,500 mg by mouth daily with breakfast.    . omeprazole (PRILOSEC) 20 MG capsule Take 1 capsule (20 mg total) by mouth daily. 30 capsule 0  . rosuvastatin (CRESTOR) 20 MG tablet Take 20 mg by mouth daily.      [name] Kitchen. topiramate (TOPAMAX) 50 MG tablet Take 1 tablet (50 mg total) by mouth 2 (two) times daily. 180 tablet 3  . traZODone (DESYREL) 100 MG tablet 3  qhs 270 tablet 8  . ziprasidone (GEODON) 80 MG capsule Take 1 capsule (80 mg total) by mouth 2 (two) times daily with a meal.  180 capsule 8   No current facility-administered medications for this visit.     Allergies as of 06/01/2016 - Review Complete 06/01/2016  Allergen Reaction Noted  . Codeine Hives and Swelling 06/07/2011  . Morphine and related Swelling and Rash 01/21/2014    Vitals: BP 110/80   Pulse 92   Resp 20   Ht 5\' 3"  (1.6 m)   Wt 215 lb (97.5 kg)   BMI 38.09 kg/m  Last Weight:  Wt Readings from Last 1 Encounters:  06/01/16 215 lb (97.5 kg)   MVH:QIONBMI:Body mass index is 38.09 kg/m.     Last Height:   Ht Readings from Last 1 Encounters:  06/01/16 5\' 3"  (1.6 m)    Physical exam:  General: The patient is awake, alert and appears not in acute distress. The patient is well groomed. Head: Normocephalic, atraumatic. Neck is supple. Mallampati 5, uvula not visible. ,  neck circumference: 19 inches . Nasal airflow congested , patient has rhinophyma.  Crowded dental status, never worn braces.  Cardiovascular:  Regular rate and rhythm, without  murmurs or carotid bruit, and without distended neck veins. Respiratory: Lungs are clear to auscultation. Skin:  Without evidence of edema, or rash Trunk: BMI is 38 now.   Neurologic exam : The patient is demure, not talking.     Attention span & concentration ability appears normal.  Speech is fluent,  With  dysphonia .  Mood and affect are depressed .  Cranial nerves: Pupils are equal and briskly reactive to light.  Visual fields by finger perimetry are intact. Hearing to finger rub intact. Facial sensation intact to fine touch.Facial motor strength is symmetric and tongue and uvula move midline. Shoulder shrug was symmetrical.    The patient was advised of the nature of the diagnosed sleep disorder , the treatment options and risks for general a health and wellness arising from not treating the condition.  I spent more than 30 minutes of face to face time with the patient. Greater than 50% of time was spent in counseling and coordination of care. We  have discussed the diagnosis and differential and I answered the patient's questions.     Assessment:  After physical and neurologic examination, review of laboratory studies,  Personal review of imaging studies, reports of other /same  Imaging studies ,  Results of polysomnography/ neurophysiology testing and pre-existing records as far as provided in visit., my assessment is   1) Mrs. Jamie Constant has lost 30 pounds since she underwent a sleep study in August 2011, at the time she already presented with headaches, bipolar depression and anxiety, chronic pain, and reported excessive daytime sleepiness. New is at this time but her mother who is now living with her has witnessed her to stop breathing in her sleep and has witnessed her to snore. Her excessive daytime sleepiness is again confirmed and actually to a higher degree than 6 years ago her Epworth score rose from 10 to now 13 points. I would like for the patient to undergo unattended sleep study was possible split-night polysomnography. Since she has sleep related headaches headaches that wake her and headaches that she wakes up with in the mornings, I will order a capnography. Attention to hypoxemia.    Plan:  Treatment plan and additional workup :  SPLIt with CO2, Rv after sleep study.      Porfirio Mylar Kashara Blocher MD  06/01/2016   CC: Dr Terrace Arabia and Darrol Angel, NP  Dr Donell Beers Dr Neita Carp

## 2016-06-28 ENCOUNTER — Ambulatory Visit (INDEPENDENT_AMBULATORY_CARE_PROVIDER_SITE_OTHER): Payer: Medicare Other | Admitting: Neurology

## 2016-06-28 DIAGNOSIS — R519 Headache, unspecified: Secondary | ICD-10-CM

## 2016-06-28 DIAGNOSIS — G4733 Obstructive sleep apnea (adult) (pediatric): Secondary | ICD-10-CM

## 2016-06-28 DIAGNOSIS — G4719 Other hypersomnia: Secondary | ICD-10-CM

## 2016-06-28 DIAGNOSIS — R0683 Snoring: Secondary | ICD-10-CM

## 2016-06-28 DIAGNOSIS — R51 Headache: Secondary | ICD-10-CM

## 2016-07-06 ENCOUNTER — Telehealth: Payer: Self-pay | Admitting: Neurology

## 2016-07-06 DIAGNOSIS — R0902 Hypoxemia: Secondary | ICD-10-CM

## 2016-07-06 DIAGNOSIS — G4733 Obstructive sleep apnea (adult) (pediatric): Secondary | ICD-10-CM

## 2016-07-06 NOTE — Procedures (Signed)
PATIENT'S NAME:  Jamie [name], Jamie [name] DOB:      [birth year]      MR#:    [id number]     DATE OF RECORDING: 06/28/2016 REFERRING M.D.:  Fara ChutePaul Sasser, MD Study Performed:   Baseline Polysomnogram HISTORY:  Patient is a 45 year old female with a diagnosis of hypertension, anxiety, hyperlipidemia, morbid obesity, chronic lower back pain, status post lumbar laminectomy in June 2016, performed by Dr. Hilda LiasErnesto Botero. She has a history of polycystic ovarian disease. She is here today to be evaluated for possible sleep apnea-insomnia.  (Anxiety, bipolar depression, GERD, hernia, headaches, hypertension, PCOS and PONV)  She has primary insomnia having trouble to go to sleep as well as having trouble staying asleep. This is a chronic insomnia it has been present for longer than 10 years, and has been associated with her bipolar disorder. Dr. Donell BeersPlovsky is treating her - he prescribed trazodone to help with sleep initiation.  The patient endorsed the Epworth Sleepiness Scale at 13/24 points.  The patient's weight 215 pounds with a height of 63 (inches), resulting in a BMI of 38.3 kg/m2.The patient's neck circumference measured 19 inches.  CURRENT MEDICATIONS: Acetaminophen, Bupropion, Clonazepam, Duloxetine, Lisinopril, Metformin, Omeprazole, Rosuvastatin, Topiramate, Trazodone and Ziprasidone   PROCEDURE:  This is a multichannel digital polysomnogram utilizing the Somnostar 11.2 system.  Electrodes and sensors were applied and monitored per AASM Specifications.   EEG, EOG, Chin and Limb EMG, were sampled at 200 Hz.  ECG, Snore and Nasal Pressure, Thermal Airflow, Respiratory Effort, CPAP Flow and Pressure, Oximetry was sampled at 50 Hz. Digital video and audio were recorded.      BASELINE STUDY  Lights Out was at 21:09 and Lights On at 05:08.  Total recording time (TRT) was 479.5 minutes, with a total sleep time (TST) of 431.5 minutes.  The patient's sleep latency was 30 minutes.  REM latency was 425.5 minutes.  The sleep  efficiency was 90. 1%.     SLEEP ARCHITECTURE: WASO (Wake after sleep onset) was 33 minutes.  There were 29.5 minutes in Stage N1, 364.5 minutes Stage N2, 0 minutes Stage N3 and 37.5 minutes in Stage REM.  The percentage of Stage N1 was 6.8%, Stage N2 was 84.5%, Stage N3 was 0% and Stage R (REM sleep) was 8.7%.   RESPIRATORY ANALYSIS:  There were a total of 69 respiratory events:  0 apneas and 69 hypopneas 0 respiratory event related arousals (RERAs).  The total APNEA/HYPOPNEA INDEX (AHI) was 9.6/hour and the total RESPIRATORY DISTURBANCE INDEX was 9.6 /hour.  9 events occurred in REM sleep and 120 events in NREM. The REM AHI was 14.4 /hour, versus a non-REM AHI of 9.1. The patient spent 97 minutes of total sleep time in the supine position and 335 minutes in non-supine. The supine AHI was 13.0 versus a non-supine AHI of 8.6.  OXYGEN SATURATION & C02:  The Wake baseline 02 saturation was 90%, with the lowest being 85%. Time spent below 89% saturation equaled 62 minutes.  PERIODIC LIMB MOVEMENTS:   The patient had a total of 0 Periodic Limb Movements.    EKG was in keeping with normal sinus rhythm (NSR). The patient snored.  The patient had no bathroom breaks.   IMPRESSION:  1. Mild Obstructive Sleep Apnea (OSA), with supine and REM accentuation and prolonged hypoxemia. 2. Primary Snoring, independent of sleep position. 3. No evidence of insomnia- sleep perception may be disturbed. 4. Reduced REM sleep proportion, likely medication related ( SSRI )   RECOMMENDATIONS:  1. Advise full-night, attended, CPAP titration study to optimize therapy. This will be an opportunity to titrate to eliminate apnea . If hypoxemia does not improve under CPAP use, oxygen can be titrated in lab.   2. Further information regarding OSA may be obtained from [employer] (www.sleepfoundation.org) or American Sleep Apnea Association (www.sleepapnea.org). 3. Oxygen therapy at 2-4 l/min. may be of  help. 4. Reduce narcotic or tranquilizer dose or discontinue if possible. 5. Avoid caffeine-containing beverages and chocolate. 6. Dedicated sleep psychology/ psychiatry follow up if insomnia is of clinical concern.   7. A follow up appointment will be scheduled in [facility]. The referring provider will be notified of the results.      I certify that I have reviewed the entire raw data recording prior to the issuance of this report in accordance with the Standards of Accreditation of the American Academy of Sleep Medicine (AASM)     Melvyn Novasarmen Bitania Shankland, MD 07-06-2016 Diplomat, American Board of Psychiatry and Neurology  Diplomat, American Board of Sleep Medicine Medical Director, [facility]

## 2016-07-07 ENCOUNTER — Encounter: Payer: Self-pay | Admitting: Neurology

## 2016-07-07 NOTE — Telephone Encounter (Signed)
Note closed. Results for sleep study were filed.

## 2016-07-08 NOTE — Telephone Encounter (Signed)
-----   Message from Melvyn Novasarmen Dohmeier, MD sent at 07/07/2016 12:20 PM EST ----- I ordered CPAP titration based on this patients  OSA pattern and co-morbidities and symptoms. CD

## 2016-07-08 NOTE — Telephone Encounter (Signed)
I spoke to pt and advised her that Dr. Vickey Hugerohmeier found mild osa with hypoxemia during her sleep study and that Dr. Vickey Hugerohmeier recommends coming in for a cpap titration because of this and her co-morbidities and symptoms. Pt is agreeable to this study. I explained the process for a cpap titration and that our sleep lab will call her to get it scheduled. Pt verbalized understanding of results. Pt had no questions at this time but was encouraged to call back if questions arise.

## 2016-07-22 ENCOUNTER — Ambulatory Visit (INDEPENDENT_AMBULATORY_CARE_PROVIDER_SITE_OTHER): Payer: Medicare Other | Admitting: Neurology

## 2016-07-22 DIAGNOSIS — G4733 Obstructive sleep apnea (adult) (pediatric): Secondary | ICD-10-CM | POA: Diagnosis not present

## 2016-07-22 DIAGNOSIS — R0902 Hypoxemia: Secondary | ICD-10-CM

## 2016-07-23 ENCOUNTER — Telehealth: Payer: Self-pay | Admitting: Neurology

## 2016-07-23 DIAGNOSIS — G4733 Obstructive sleep apnea (adult) (pediatric): Secondary | ICD-10-CM

## 2016-07-23 NOTE — Telephone Encounter (Signed)
Please call:  OSA treated with CPAP. I ordered  CPAP at 7 cm water with a Res Med Air Fit P10 nasal pillows, small apparatus. CD

## 2016-07-23 NOTE — Procedures (Signed)
PATIENT'S NAME:  Jamie [name], Jamie K. DOB:      Sep 27, 1970      MR#:    [id number]     DATE OF RECORDING: 07/22/2016 REFERRING M.D.:  Fara ChutePaul Sasser, MD and Dr. Donell BeersPlovsky Study Performed:   CPAP  Titration HISTORY:  Pt returning for titration following a [facility]  on 06/28/2016, which resulted in an  AHI of 9.6, REM AHI of 14.4 and supine AHI of 13.0 and SPO2 nadir of 85%.The patient had prolonged periods of hypoxemia , total desaturation time was  62 minutes at or below 88% SpO2. The patient endorsed the Epworth Sleepiness Scale at 13/24 points.   The patient's weight 216 pounds with a height of 63 (inches), resulting in a BMI of 38.3 kg/m2. The patient's neck circumference measured 19 inches.  CURRENT MEDICATIONS: Acetaminophen, Bupropion, Clonazepam, Duloxetine, Lisinopril, Metformin, Omeprazole, Rosuvastatin, Topiramate, Trazodone and Ziprasidone  PROCEDURE:  This is a multichannel digital polysomnogram utilizing the SomnoStar 11.2 system.  Electrodes and sensors were applied and monitored per AASM Specifications.   EEG, EOG, Chin and Limb EMG, were sampled at 200 Hz.  ECG, Snore and Nasal Pressure, Thermal Airflow, Respiratory Effort, CPAP Flow and Pressure, Oximetry was sampled at 50 Hz. Digital video and audio were recorded.      CPAP was initiated at 5 cmH20 with heated humidity per AASM split night standards and pressure was advanced to 7/7cmH20 because of hypopneas, apneas and desaturations.  At a PAP pressure of 7 cmH20, there was a reduction of the AHI to 0.0 with improvement of the above symptoms of obstructive sleep apnea.    Lights Out was at 21:10 and Lights On at 05:07. Total recording time (TRT) was 478 minutes, with a total sleep time (TST) of 441.5 minutes. The patient's sleep latency was 22.5 minutes with 0 minutes of wake time after sleep onset. REM latency was 176.5 minutes.  The sleep efficiency was 92.4 %.    SLEEP ARCHITECTURE: WASO (Wake after sleep  onset) was 14 minutes.  There were 24.5 minutes in Stage N1, 157.5 minutes Stage N2, 189.5 minutes Stage N3 and 70 minutes in Stage REM.  The percentage of Stage N1 was 5.5%, Stage N2 was 35.7%, Stage N3 was 42.9% and Stage R (REM sleep) was 15.9%.   The arousals were noted as: 13 were spontaneous, 0 were associated with PLMs, and 0 were associated with respiratory events. Audio and video analysis did not show any abnormal or unusual movements, behaviors, phonations or vocalizations.   EKG was in keeping with normal sinus rhythm (NSR).  RESPIRATORY ANALYSIS:  There were 0 respiratory events. The patient also had 0 respiratory event related arousals (RERAs).     The total APNEA/HYPOPNEA INDEX  (AHI) was 0.0 /hour and the total RESPIRATORY DISTURBANCE INDEX was 0 .hour  0 events occurred in REM sleep and 0 events in NREM. The REM AHI was 0 /hour versus a non-REM AHI of 0.0 /hour.  The patient spent 177 minutes of total sleep time in the supine position and 265 minutes in non-supine. The supine AHI was 0.0, versus a non-supine AHI of 0.0.  OXYGEN SATURATION & C02:  The baseline 02 saturation was 94%, with the lowest being 90%. Time spent below 89% saturation equaled 0 minutes.  PERIODIC LIMB MOVEMENTS:    The patient had a total of 0 Periodic Limb Movements   DIAGNOSIS : OSA treated with CPAP. Excellent response to CPAP titration with complete alleviation of  OSA, AHI was 0.0 in REM and in supine sleep. CPAP at 7 cm water with a Res Med Air Fit P10 nasal pillows, small apparatus.    PLANS/RECOMMENDATIONS:  A follow up appointment will be scheduled within 30-65 days after CPAP was issued  in the Sleep Clinic at [facility].   Please call [phone] with any questions.      I certify that I have reviewed the entire raw data recording prior to the issuance of this report in accordance with the Standards of Accreditation of the American Academy of Sleep Medicine  (AASM)   Melvyn Novasarmen Guenther Dunshee, M.D.  07-23-2016 Diplomat, American Board of Psychiatry and Neurology  Diplomat, American Board of Sleep Medicine Medical Director, [facility]

## 2016-07-30 ENCOUNTER — Other Ambulatory Visit (HOSPITAL_COMMUNITY): Payer: Self-pay | Admitting: Psychiatry

## 2016-08-02 NOTE — Telephone Encounter (Signed)
I spoke to patient and she is aware of results and recommendations. She is willing to start treatment. I sent copy to PCP. We will [facility]. Patient will get a letter reminding her to make f/u appt and stress the importance of compliance.

## 2016-08-02 NOTE — Telephone Encounter (Signed)
-----   Message from Melvyn Novasarmen Dohmeier, MD sent at 07/23/2016 10:53 AM EST ----- DIAGNOSIS : OSA treated with CPAP. Excellent response to CPAP titration with complete alleviation of OSA, AHI was 0.0 in REM and in supine sleep. CPAP at 7 cm water with a Res Med Air Fit P10 nasal pillows, small apparatus. Orders placed on 07-23-2016

## 2016-08-03 NOTE — Telephone Encounter (Signed)
[facility] is unable to accept Medicare so pt will need to switch to another DME. I called pt to discuss. No answer, left a message asking her to call me back.

## 2016-08-04 NOTE — Telephone Encounter (Signed)
Pt returned my call. I advised her that [facility] does not accept Medicare and that I would need to switch her DME to one that does accept Medicare. Pt is agreeable to using [facility].  I advised her that I will send this referral to [facility] and that she should hear from them within a week. If she does not hear from me, she will call me back. Pt is also agreeable to a follow up appt with Dr. Vickey Hugerohmeier on 10/18/2016 at 3:30pm. Pt verbalized understanding of new appt date and time. Will send pt an updated letter.

## 2016-09-13 DIAGNOSIS — I10 Essential (primary) hypertension: Secondary | ICD-10-CM | POA: Diagnosis not present

## 2016-09-13 DIAGNOSIS — E782 Mixed hyperlipidemia: Secondary | ICD-10-CM | POA: Diagnosis not present

## 2016-09-13 DIAGNOSIS — K219 Gastro-esophageal reflux disease without esophagitis: Secondary | ICD-10-CM | POA: Diagnosis not present

## 2016-09-13 DIAGNOSIS — R739 Hyperglycemia, unspecified: Secondary | ICD-10-CM | POA: Diagnosis not present

## 2016-09-15 DIAGNOSIS — I10 Essential (primary) hypertension: Secondary | ICD-10-CM | POA: Diagnosis not present

## 2016-09-15 DIAGNOSIS — E782 Mixed hyperlipidemia: Secondary | ICD-10-CM | POA: Diagnosis not present

## 2016-09-15 DIAGNOSIS — F331 Major depressive disorder, recurrent, moderate: Secondary | ICD-10-CM | POA: Diagnosis not present

## 2016-09-15 DIAGNOSIS — M545 Low back pain: Secondary | ICD-10-CM | POA: Diagnosis not present

## 2016-09-15 DIAGNOSIS — E282 Polycystic ovarian syndrome: Secondary | ICD-10-CM | POA: Diagnosis not present

## 2016-09-15 DIAGNOSIS — K219 Gastro-esophageal reflux disease without esophagitis: Secondary | ICD-10-CM | POA: Diagnosis not present

## 2016-09-22 ENCOUNTER — Encounter (HOSPITAL_COMMUNITY): Payer: Self-pay | Admitting: Psychiatry

## 2016-09-22 ENCOUNTER — Ambulatory Visit (INDEPENDENT_AMBULATORY_CARE_PROVIDER_SITE_OTHER): Payer: Medicare Other | Admitting: Psychiatry

## 2016-09-22 VITALS — BP 122/74 | HR 93 | Ht 63.0 in | Wt 214.6 lb

## 2016-09-22 DIAGNOSIS — Z79899 Other long term (current) drug therapy: Secondary | ICD-10-CM

## 2016-09-22 DIAGNOSIS — Z818 Family history of other mental and behavioral disorders: Secondary | ICD-10-CM

## 2016-09-22 DIAGNOSIS — F3342 Major depressive disorder, recurrent, in full remission: Secondary | ICD-10-CM

## 2016-09-22 DIAGNOSIS — F323 Major depressive disorder, single episode, severe with psychotic features: Secondary | ICD-10-CM

## 2016-09-22 MED ORDER — ZIPRASIDONE HCL 80 MG PO CAPS: 80.0000 mg | ORAL_CAPSULE | Freq: Two times a day (BID) | ORAL | 8 refills | Status: DC

## 2016-09-22 MED ORDER — BUPROPION HCL ER (XL) 300 MG PO TB24: 300.0000 mg | ORAL_TABLET | Freq: Every day | ORAL | 8 refills | Status: DC

## 2016-09-22 MED ORDER — CLONAZEPAM 1 MG PO TABS: ORAL_TABLET | ORAL | 5 refills | Status: DC

## 2016-09-22 MED ORDER — TRAZODONE HCL 100 MG PO TABS: ORAL_TABLET | ORAL | 8 refills | Status: DC

## 2016-09-22 NOTE — Progress Notes (Signed)
Patient ID: Jamie [name], female   DOB: 08/20/1970, 46 y.o.   MRN: [id number] [facility] MD Progress Note  09/22/2016 2:53 PM JOYCELYNN FRITSCHE  MRN:  [id number] Subjective:  Feeling well. Principal Problem: Past Psychiatric History:  The patient is doing well. Today she feels healthy. She has met the man she's been talking on line yet. But is [place]. He isn't Science writer in [place]. He lives 0 and will be her next month or 2. Patient says she doing well. She denies being depressed. The patient is sleeping and eating well. She's good energy. She enjoys reading watching TV and playing with her daughter 1. The patient is a good sense of worth. She denies being suicidal. Her pain in her lower back is significantly reduced taking Cymbalta. She denies any psychotic symptoms this time. She denies the use of alcohol or drugs. She is engaging and friendly. Her anxiety is very well controlled. The patient continues in therapy monthly with Vernell Leep.  Past Medical History:  Past Medical History:  Diagnosis Date  . Anxiety   . Bipolar 1 disorder ([place])   . Complication of anesthesia   . Depression   . GERD (gastroesophageal reflux disease)   . H/O hiatal hernia   . Headache(784.0)   . High cholesterol   . Hypertension   . PCOS (polycystic ovarian syndrome)   . PONV (postoperative nausea and vomiting)    nausea    Past Surgical History:  Procedure Laterality Date  . ANAL FISSURE REPAIR     x 3  . BACK SURGERY  2009  . CHOLECYSTECTOMY  07/2002  . KNEE ARTHROSCOPY Right   . LUMBAR LAMINECTOMY/DECOMPRESSION MICRODISCECTOMY Right 01/22/2014   Procedure: LUMBAR FOUR TO FIVE LUMBAR LAMINECTOMY/DECOMPRESSION MICRODISCECTOMY 1 LEVEL;  Surgeon: Floyce Stakes, MD;  Location: [facility];  Service: Neurosurgery;  Laterality: Right;  Right L45 diskectomy   Family History:  Family History  Problem Relation Age of Onset  . Anxiety disorder Maternal Grandmother   . Depression Maternal  Grandmother    Family Psychiatric  History:  Social History:  History  Alcohol Use No     History  Drug Use No    Social History   Social History  . Marital status: Single    Spouse name: [name]  . Number of children: 0  . Years of education: college   Occupational History  . disabled    Social History Main Topics  . Smoking status: Never Smoker  . Smokeless tobacco: Never Used  . Alcohol use No  . Drug use: No  . Sexual activity: No   Other Topics Concern  . None   Social History Narrative   Patient lives at home alone    Patient is right handed   Patient drinks soda's daily   Additional Social History:                         Sleep: Fair  Appetite:  Good  Current Medications: Current Outpatient Prescriptions  Medication Sig Dispense Refill  . acetaminophen (TYLENOL) 500 MG tablet Take 1,000 mg by mouth every 6 (six) hours as needed for mild pain or moderate pain.    [name] Kitchen buPROPion (WELLBUTRIN XL) 300 MG 24 hr tablet Take 1 tablet (300 mg total) by mouth daily. 30 tablet 8  . clonazePAM (KLONOPIN) 1 MG tablet 1  qam   1  q midday   Half qhs 75 tablet 5  .  DULoxetine (CYMBALTA) 60 MG capsule Take 2 capsules (120 mg total) by mouth daily. 180 capsule 7  . lisinopril (PRINIVIL,ZESTRIL) 5 MG tablet Take 5 mg by mouth daily.    . metformin (FORTAMET) 500 MG (OSM) 24 hr tablet Take 1,500 mg by mouth daily with breakfast.    . omeprazole (PRILOSEC) 20 MG capsule Take 1 capsule (20 mg total) by mouth daily. 30 capsule 0  . rosuvastatin (CRESTOR) 20 MG tablet Take 20 mg by mouth daily.      [name] Kitchen topiramate (TOPAMAX) 50 MG tablet Take 1 tablet (50 mg total) by mouth 2 (two) times daily. 180 tablet 3  . traZODone (DESYREL) 100 MG tablet 3  qhs 90 tablet 8  . ziprasidone (GEODON) 80 MG capsule Take 1 capsule (80 mg total) by mouth 2 (two) times daily with a meal. 60 capsule 8   No current facility-administered medications for this visit.     Lab Results: No results  found for this or any previous visit (from the past 48 hour(s)).  Physical Findings: AIMS:  , ,  ,  ,    CIWA:    COWS:     Musculoskeletal: Strength & Muscle Tone: within normal limits Gait & Station: normal Patient leans: Right  Psychiatric Specialty Exam: ROS  Blood pressure 122/74, pulse 93, height '5\' 3"'  (1.6 m), weight 214 lb 9.6 oz (97.3 kg).Body mass index is 38.01 kg/m.  General Appearance: Casual  Eye Contact::  Good  Speech:  Clear and Coherent  Volume:  Normal  Mood:  Euthymic  Affect:  Appropriate  Thought Process:  Coherent  Orientation:  Full (Time, Place, and Person)  Thought Content:  NA and WDL  Suicidal Thoughts:  No  Homicidal Thoughts:  No  Memory:  Immediate;   NA  Judgement:  Good  Insight:  Good  Psychomotor Activity:  NA  Concentration:  Good  Recall:  Good  Fund of Knowledge:Good  Language: Good  Akathisia:  No  Handed:  Right  AIMS (if indicated):     Assets:  Desire for Improvement  ADL's:  Intact  Cognition: WNL  Sleep:      Treatment Plan Summary: 09/22/2016, 2:53 PM Today the patient is stable. She said she has felt this well for about a year now. Today we talked about the possibility reducing her medicines. Interestingly the patient has been on this group of medicines 2013 when she was psychiatric hospital she's not had any changes at all. Today we'll start reducing her Klonopin to taking 1 mg morning day and a half a milligram. When she returns in 3 months we will slowly reduce her down to a half of a 1 mg 3 times a day after this visit. For now the patient continue Geodon 80 mg twice a day. For the years out and start reducing Geodon as well. The patient will continue taking her Cymbalta 60 mg the believe she is also on Wellbutrin which will continue. The patient takes trazodone 300 mg. The patient feels good about life. It should be noted that she's lost 5 more pounds. She shows no evidence of tardive dyskinesia. She is very stable.

## 2016-10-16 ENCOUNTER — Encounter: Payer: Self-pay | Admitting: Nurse Practitioner

## 2016-10-18 ENCOUNTER — Ambulatory Visit (INDEPENDENT_AMBULATORY_CARE_PROVIDER_SITE_OTHER): Payer: Medicare Other | Admitting: Neurology

## 2016-10-18 ENCOUNTER — Encounter: Payer: Self-pay | Admitting: Neurology

## 2016-10-18 VITALS — BP 117/80 | HR 96 | Resp 20 | Ht 63.0 in | Wt 211.0 lb

## 2016-10-18 DIAGNOSIS — Z9989 Dependence on other enabling machines and devices: Secondary | ICD-10-CM | POA: Diagnosis not present

## 2016-10-18 DIAGNOSIS — G4733 Obstructive sleep apnea (adult) (pediatric): Secondary | ICD-10-CM

## 2016-10-18 DIAGNOSIS — G4734 Idiopathic sleep related nonobstructive alveolar hypoventilation: Secondary | ICD-10-CM | POA: Diagnosis not present

## 2016-10-18 NOTE — Progress Notes (Signed)
[facility]   Provider:  Melvyn [name], M D  Referring Provider:  Dr. Terrace [name] / Jamie Angel, NP  Primary Care Physician:  Jamie Pandy, MD  Chief Complaint  Patient presents with  . Follow-up    feels better with cpap    HPI:  Jamie [name] is a 46 y.o. female , seen here as a referral from Dr. Terrace [name] and Jamie Angel, NP,  I had the pleasure of seeing Jamie [name] today following up on 10/18/2016 from a baseline polysomnogram from 06/28/2016. The patient presented for an in lab sleep study with chronic daily headaches, excessive daytime sleepiness, morbid obesity, observed apnea and snoring. She had endorsed the Epworth score at 13 points and was diagnosed with a mild dish apnea of only 9.6 per hour AHI. In supine sleep position there was a slight exacerbation to 13 apneas in our, during REM sleep to 14.4 per hour. It was the REM sleep distribution and 62 minutes of desaturation time that make me recommend CPAP therapy for the patient. She returned for an attended titration on 07/22/2016 and was titrated to 7 cm water. Besides reviewing the 2 sleep studies with her I can also today discussed her first compliance download. She has used the machine every day but she often fails to use it for 4 hours or longer. Overall she feels better than she uses CPAP and her sleepiness and daytime has decreased. Her average user time is only 4 hours and 45 minutes for the last 30 days she still has a lot of obstructive apneas and HEENT the seems to be artificially related to high air leaks. My goal is today to review the mask fit and to place her on an auto titrate her. Her machine should be able to provide this service for her I will ask her to use the machine every day for at least 4 hours at night.      Last visit note , CD  Jamie [name] has been established patient of Dr. Zannie [name] whom she sees yearly for follow-up. She has a history of chronic headaches which are only fairly controlled. Usually  5-6 times a month she wakes up with a headache. She also has daytime excessive sleepiness endorsing the Epworth score at 13 points, she is currently treated with Topamax 50 mg twice a day and she is treated for bipolar disorder by Dr. Donell [name],  further carries a diagnosis of hypertension, anxiety, hyperlipidemia, morbid obesity, chronic lower back pain, status post lumbar laminectomy in June 2016 performed by Dr. Hilda [name]. She has a history of polycystic ovarian disease. Recent MRIs and MR venogram of the brain have been normal she saw Jamie [name] last on 05/04/2016 and received refills for her medications. She is here today to be evaluated for possible sleep apnea-insomnia.  She has primary insomnia having trouble to go to sleep as well as having trouble staying asleep. This is a chronic insomnia it has been present for longer than 10 years, and has been associated with her bipolar disorder. Dr. Olevia [name] is treating her for it. She has been prescribed trazodone helps with sleep initiation but it doesn't help as well with sleep maintenance. She is also treated with Geodon, Topamax, Prilosec, Crestor, metformin, Cymbalta, Klonopin, Wellbutrin, Tylenol, and Prinivil. Sleep habits are as follows: She goes to bed as early as 7:30 PM and is usually asleep within 2 hours after taking trazodone. She usually watches TV before going to the bedroom. Her bedroom as core, quiet  and dark, she sleeps on her sides, she is using one pillow for head support. She does not complain of heartburn or acid reflux, since taking medicine for it. She wakes up at night twice to go to the bathroom, sometimes she cannot go back to sleep. She does not wake up from pain or headaches, but she also wakes up with a feeling of shortness of breath , breath holding spells. She rises in the morning at about 7 AM her dog usually wakes her at that time. She does have headaches and a dry mouth usually in the morning at that time. Usually she has to  take medication for the headache and the headaches will resolve as the day goes on. Usually she feels energized, refreshed and restored in the mornings. She was told by friends and family that she snores loudly, that she talks in her sleep, sometimes stops to breath - she is not known to act out or is restless in her sleep,she has never slept walked. Sleep medical history and family sleep history: Her father suffered from sleep apnea, [name] was evaluated over 6 years ago in a sleep study - referred by Dr. Terrace ArabiaYan on 03/19/2010. At the time she had a BMI of 43, Becks inventory endorsed at 22 points, and slept for 90% of the recorded time. There was no significant apnea noted the overall AHI was 0.1 and the oxygen nadir 92, there were no PLMS noted. No follow up. Insomnia was not verified.  Social history: single, living with her widowed mother, the patient is on disability. 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 She does not use tobacco products, does not use are cool, she does not drink caffeine in any form. He does not eat chocolates.  [name] referral nore :  46 y.o. year old female  has a past medical history of Hypertension; High cholesterol; Bipolar 1 disorder (HCC); Depression; Anxiety; Headache(784.0); PCOS (polycystic ovarian syndrome); here to follow up. Her headaches are in fair control  on Topamax. She has a new complaint of morning headaches and daytime somnolence. ESS 12, BMI 40.09  PLAN:Continue Topamax at current dose will refill for one year Will obtain sleep study  I explained in particular the risks and ramifications of untreated moderate to severe OSA, especially with respect to cardiovascular disease  including congestive heart failure, difficult to treat hypertension, cardiac arrhythmias, or stroke. Even type 2 diabetes has, in part, been linked to untreated OSA. Symptoms of untreated OSA include daytime sleepiness, memory problems, mood irritability and mood  disorder such as depression and anxiety, lack of energy, as well as recurrent headaches, especially morning headaches. We talked about trying to maintain a healthy lifestyle in general, as well as the importance of weight control. She was congratulated for her 30 pound weight loss I encouraged the patient to eat healthy, exercise daily and keep well hydrated, to keep a scheduled bedtime and wake time routine, to not skip any meals and eat healthy snacks in between meals   Review of Systems: Out of a complete 14 system review, the patient complains of only the following symptoms, and all other reviewed systems are negative. She still reports some nocturia, Epworth on CPAP at 8 points, from 13 and a fatigue severity score at 38 points, down from 56.    Social History   Social History  . Marital status: Single    Spouse name: [name]  . Number of children: 0  . Years  of education: college   Occupational History  . disabled    Social History Main Topics  . Smoking status: Never Smoker  . Smokeless tobacco: Never Used  . Alcohol use No  . Drug use: No  . Sexual activity: No   Other Topics Concern  . Not on file   Social History Narrative   Patient lives at home alone    Patient is right handed   Patient drinks soda's daily    Family History  Problem Relation Age of Onset  . Anxiety disorder Maternal Grandmother   . Depression Maternal Grandmother     Past Medical History:  Diagnosis Date  . Anxiety   . Bipolar 1 disorder (HCC)   . Complication of anesthesia   . Depression   . GERD (gastroesophageal reflux disease)   . H/O hiatal hernia   . Headache(784.0)   . High cholesterol   . Hypertension   . PCOS (polycystic ovarian syndrome)   . PONV (postoperative nausea and vomiting)    nausea    Past Surgical History:  Procedure Laterality Date  . ANAL FISSURE REPAIR     x 3  . BACK SURGERY  2009  . CHOLECYSTECTOMY  07/2002  . KNEE ARTHROSCOPY Right   . LUMBAR  LAMINECTOMY/DECOMPRESSION MICRODISCECTOMY Right 01/22/2014   Procedure: LUMBAR FOUR TO FIVE LUMBAR LAMINECTOMY/DECOMPRESSION MICRODISCECTOMY 1 LEVEL;  Surgeon: Karn Cassis, MD;  Location: [facility];  Service: Neurosurgery;  Laterality: Right;  Right L45 diskectomy    Current Outpatient Prescriptions  Medication Sig Dispense Refill  . acetaminophen (TYLENOL) 500 MG tablet Take 1,000 mg by mouth every 6 (six) hours as needed for mild pain or moderate pain.    [name] Kitchen buPROPion (WELLBUTRIN XL) 300 MG 24 hr tablet Take 1 tablet (300 mg total) by mouth daily. 30 tablet 8  . clonazePAM (KLONOPIN) 1 MG tablet 1  qam   1  q midday   Half qhs 75 tablet 5  . DULoxetine (CYMBALTA) 60 MG capsule Take 2 capsules (120 mg total) by mouth daily. 180 capsule 7  . lisinopril (PRINIVIL,ZESTRIL) 5 MG tablet Take 5 mg by mouth daily.    . metformin (FORTAMET) 500 MG (OSM) 24 hr tablet Take 1,500 mg by mouth daily with breakfast.    . omeprazole (PRILOSEC) 20 MG capsule Take 1 capsule (20 mg total) by mouth daily. 30 capsule 0  . rosuvastatin (CRESTOR) 20 MG tablet Take 20 mg by mouth daily.      [name] Kitchen topiramate (TOPAMAX) 50 MG tablet Take 1 tablet (50 mg total) by mouth 2 (two) times daily. 180 tablet 3  . traZODone (DESYREL) 100 MG tablet 3  qhs 90 tablet 8  . ziprasidone (GEODON) 80 MG capsule Take 1 capsule (80 mg total) by mouth 2 (two) times daily with a meal. 60 capsule 8   No current facility-administered medications for this visit.     Allergies as of 10/18/2016 - Review Complete 10/18/2016  Allergen Reaction Noted  . Codeine Hives and Swelling 06/07/2011  . Morphine and related Swelling and Rash 01/21/2014    Vitals: BP 117/80   Pulse 96   Resp 20   Ht 5\' 3"  (1.6 m)   Wt 211 lb (95.7 kg)   BMI 37.38 kg/m  Last Weight:  Wt Readings from Last 1 Encounters:  10/18/16 211 lb (95.7 kg)   ZOX:WRUE mass index is 37.38 kg/m.     Last Height:   Ht Readings from Last 1 Encounters:  10/18/16 5\' 3"   (1.6 m)    Physical exam:  General: The patient is awake, alert and appears not in acute distress. The patient is well groomed. Head: Normocephalic, atraumatic. Neck is supple. Mallampati 5, uvula not visible. neck circumference: 19 inches . Nasal airflow congested , patient has rhinophyma. Crowded dental status, never worn braces.  Trunk: BMI is 38 now.   Neurologic exam :  Mood and affect are depressed .  Cranial nerves: Pupils are equal and briskly reactive to light.  Visual fields by finger perimetry are intact. Hearing to finger rub intact. Facial sensation intact to fine touch.Facial motor strength is symmetric and tongue and uvula move midline. Shoulder shrug was symmetrical.   The patient was advised of the nature of the diagnosed sleep disorder , the treatment options and risks for general a health and wellness arising from not treating the condition.  I spent more than 20 minutes of face to face time with the patient. Greater than 50% of time was spent in counseling and coordination of care. We have discussed the diagnosis and differential and I answered the patient's questions.     Assessment:  After physical and neurologic examination, review of laboratory studies,  Personal review of imaging studies, reports of other /same  Imaging studies ,  Results of polysomnography/ neurophysiology testing and pre-existing records as far as provided in visit., my assessment is   1)  OSA ; Jamie [name] has mild apnea, hypoxemia, and her apnea was supine and REM sleep dependent. I will ask her to continue CPAP, but I changed today CPAP from Aricept pressure of 7 cm water to an AutoSet treating 5 and 10 cm water. 2) compliance and air leaks)  Since all residual apnea seem to be obstructive in nature on higher pressure should be eliminating those.  The patient will use the machine for more than 5 hours each day.  We will follow-up at 90 days either with me or the nurse practitioner to assure  compliance on the new settings. I will offer her an alternative interface as she has very high air leaks. Order will go out to her DME, namely  [facility]   RV with [employer] , Np in 60-90 days.    Porfirio Mylar Tyrie Porzio MD  10/18/2016   CC: Dr Jamie [name] and Jamie Angel, NP  Dr Jamie [name] Dr Neita Carp

## 2016-10-18 NOTE — Patient Instructions (Signed)
Congratulation on your weight loss of 50 pounds !    CPAP and BiPAP Information CPAP and BiPAP are methods of helping a person breathe with the use of air pressure. CPAP stands for "continuous positive airway pressure." BiPAP stands for "bi-level positive airway pressure." In both methods, air is blown through your nose or mouth and into your air passages to help you breathe well. CPAP and BiPAP use different amounts of pressure to blow air. With CPAP, the amount of pressure stays the same while you breathe in and out. With BiPAP, the amount of pressure is increased when you breathe in (inhale) so that you can take larger breaths. Your health care provider will recommend whether CPAP or BiPAP would be more helpful for you. Why are CPAP and BiPAP treatments used? CPAP or BiPAP can be helpful if you have:  Sleep apnea.  Chronic obstructive pulmonary disease (COPD).  Heart failure.  Medical conditions that weaken the muscles of the chest including muscular dystrophy, or neurological diseases such as amyotrophic lateral sclerosis (ALS).  Other problems that cause breathing to be weak, abnormal, or difficult. CPAP is most commonly used for obstructive sleep apnea (OSA) to keep the airways from collapsing when the muscles relax during sleep. How is CPAP or BiPAP administered? Both CPAP and BiPAP are provided by a small machine with a flexible plastic tube that attaches to a plastic mask. You wear the mask. Air is blown through the mask into your nose or mouth. The amount of pressure that is used to blow the air can be adjusted on the machine. Your health care provider will determine the pressure setting that should be used based on your individual needs. When should CPAP or BiPAP be used? In most cases, the mask only needs to be worn during sleep. Generally, the mask needs to be worn throughout the night and during any daytime naps. People with certain medical conditions may also need to wear the  mask at other times when they are awake. Follow instructions from your health care provider about when to use the machine. What are some tips for using the mask?  Because the mask needs to be snug, some people feel trapped or closed-in (claustrophobic) when first using the mask. If you feel this way, you may need to get used to the mask. One way to do this is by holding the mask loosely over your nose or mouth and then gradually applying the mask more snugly. You can also gradually increase the amount of time that you use the mask.  Masks are available in various types and sizes. Some fit over your mouth and nose while others fit over just your nose. If your mask does not fit well, talk with your health care provider about getting a different one.  If you are using a mask that fits over your nose and you tend to breathe through your mouth, a chin strap may be applied to help keep your mouth closed.  The CPAP and BiPAP machines have alarms that may sound if the mask comes off or develops a leak.  If you have trouble with the mask, it is very important that you talk with your health care provider about finding a way to make the mask easier to tolerate. Do not stop using the mask. Stopping the use of the mask could have a negative impact on your health. What are some tips for using the machine?  Place your CPAP or BiPAP machine on a secure table  or stand near an electrical outlet.  Know where the on/off switch is located on the machine.  Follow instructions from your health care provider about how to set the pressure on your machine and when you should use it.  Do not eat or drink while the CPAP or BiPAP machine is on. Food or fluids could get pushed into your lungs by the pressure of the CPAP or BiPAP.  Do not smoke. Tobacco smoke residue can damage the machine.  For home use, CPAP and BiPAP machines can be rented or purchased through home health care companies. Many different brands of  machines are available. Renting a machine before purchasing may help you find out which particular machine works well for you.  Keep the CPAP or BiPAP machine and attachments clean. Ask your health care provider for specific instructions. Get help right away if:  You have redness or open areas around your nose or mouth where the mask fits.  You have trouble using the CPAP or BiPAP machine.  You cannot tolerate wearing the CPAP or BiPAP mask.  You have pain, discomfort, and bloating in your abdomen. Summary  CPAP and BiPAP are methods of helping a person breathe with the use of air pressure.  Both CPAP and BiPAP are provided by a small machine with a flexible plastic tube that attaches to a plastic mask.  If you have trouble with the mask, it is very important that you talk with your health care provider about finding a way to make the mask easier to tolerate. This information is not intended to replace advice given to you by your health care provider. Make sure you discuss any questions you have with your health care provider. Document Released: 04/09/2004 Document Revised: 05/31/2016 Document Reviewed: 05/31/2016 Elsevier Interactive Patient Education  2017 [employer]

## 2016-11-03 ENCOUNTER — Encounter: Payer: Self-pay | Admitting: Nurse Practitioner

## 2016-11-03 ENCOUNTER — Ambulatory Visit (INDEPENDENT_AMBULATORY_CARE_PROVIDER_SITE_OTHER): Payer: Medicare Other | Admitting: Nurse Practitioner

## 2016-11-03 DIAGNOSIS — G4733 Obstructive sleep apnea (adult) (pediatric): Secondary | ICD-10-CM | POA: Diagnosis not present

## 2016-11-03 DIAGNOSIS — Z9989 Dependence on other enabling machines and devices: Secondary | ICD-10-CM

## 2016-11-03 DIAGNOSIS — G43009 Migraine without aura, not intractable, without status migrainosus: Secondary | ICD-10-CM

## 2016-11-03 NOTE — Patient Instructions (Signed)
Continue Topamax at current dose  Continue CPAP at current settings Call for worsening of headaches Exercise for overall health and well being  Congratulations on weight loss F/U with me in 6 months

## 2016-11-03 NOTE — Progress Notes (Signed)
[facility]  PATIENT: Jamie [name] DOB: [birth year]   REASON FOR VISIT: episodic headache, morning headaches  HISTORY FROM:patient    HISTORY OF PRESENT ILLNESS:Ms. Jamie [name], 46 year old female returns for  followup. She has a history of headaches. .Headaches  are in good  control. She was asked to get a sleep study at her last visit . She does have obstructive sleep apnea and now she is using CPAP. Overall her sleepiness and daytime drowsiness has decreased significantly. Her headaches are much better control  She is currently taking Topamax 50 twice a day. She has significant bipolar disorder and sees psychiatry Dr. Donell Beers. She also has a past medical history of hypertension, , depression and anxiety, hyperlipidemia, chronic low back pain, lumbar laminectomy in June 2016  by Dr. Jeral Fruit. She also has a history of polycystic disease and obesity. She denies any motor or sensory deficits.   She returns for  reevaluation .   REVIEW OF SYSTEMS: Full 14 system review of systems performed and notable only for those listed, all others are neg:  Constitutional: neg  Cardiovascular: neg Ear/Nose/Throat: neg  Skin: neg Eyes: Blurred vision Respiratory: neg Gastroitestinal: neg  Hematology/Lymphatic: neg  Endocrine: neg Musculoskeletal:back pain Allergy/Immunology: neg Neurological: Headache improved Psychiatric: Depression anxiety, bipolar disorder Sleep : Obstructive sleep apnea with CPAP   ALLERGIES: Allergies  Allergen Reactions  . Codeine Hives and Swelling  . Morphine And Related Swelling and Rash    7/2: Pt state is OK in small doses and is willing to take Morphine this admission    HOME MEDICATIONS: Outpatient Medications Prior to Visit  Medication Sig Dispense Refill  . acetaminophen (TYLENOL) 500 MG tablet Take 1,000 mg by mouth every 6 (six) hours as needed for mild pain or moderate pain.    [name] Kitchen buPROPion (WELLBUTRIN XL) 300 MG 24 hr tablet Take 1 tablet  (300 mg total) by mouth daily. 30 tablet 8  . clonazePAM (KLONOPIN) 1 MG tablet 1  qam   1  q midday   Half qhs 75 tablet 5  . DULoxetine (CYMBALTA) 60 MG capsule Take 2 capsules (120 mg total) by mouth daily. 180 capsule 7  . lisinopril (PRINIVIL,ZESTRIL) 5 MG tablet Take 5 mg by mouth daily.    . metformin (FORTAMET) 500 MG (OSM) 24 hr tablet Take 1,500 mg by mouth daily with breakfast.    . omeprazole (PRILOSEC) 20 MG capsule Take 1 capsule (20 mg total) by mouth daily. 30 capsule 0  . rosuvastatin (CRESTOR) 20 MG tablet Take 20 mg by mouth daily.      [name] Kitchen topiramate (TOPAMAX) 50 MG tablet Take 1 tablet (50 mg total) by mouth 2 (two) times daily. 180 tablet 3  . traZODone (DESYREL) 100 MG tablet 3  qhs 90 tablet 8  . ziprasidone (GEODON) 80 MG capsule Take 1 capsule (80 mg total) by mouth 2 (two) times daily with a meal. 60 capsule 8   No facility-administered medications prior to visit.     PAST MEDICAL HISTORY: Past Medical History:  Diagnosis Date  . Anxiety   . Bipolar 1 disorder (HCC)   . Complication of anesthesia   . Depression   . GERD (gastroesophageal reflux disease)   . H/O hiatal hernia   . Headache(784.0)   . High cholesterol   . Hypertension   . PCOS (polycystic ovarian syndrome)   . PONV (postoperative nausea and vomiting)    nausea  . Sleep apnea     PAST SURGICAL  HISTORY: Past Surgical History:  Procedure Laterality Date  . ANAL FISSURE REPAIR     x 3  . BACK SURGERY  2009  . CHOLECYSTECTOMY  07/2002  . KNEE ARTHROSCOPY Right   . LUMBAR LAMINECTOMY/DECOMPRESSION MICRODISCECTOMY Right 01/22/2014   Procedure: LUMBAR FOUR TO FIVE LUMBAR LAMINECTOMY/DECOMPRESSION MICRODISCECTOMY 1 LEVEL;  Surgeon: Karn Cassis, MD;  Location: [facility];  Service: Neurosurgery;  Laterality: Right;  Right L45 diskectomy    FAMILY HISTORY: Family History  Problem Relation Age of Onset  . Anxiety disorder Maternal Grandmother   . Depression Maternal Grandmother      SOCIAL HISTORY: Social History   Social History  . Marital status: Single    Spouse name: [name]  . Number of children: 0  . Years of education: college   Occupational History  . disabled    Social History Main Topics  . Smoking status: Never Smoker  . Smokeless tobacco: Never Used  . Alcohol use No  . Drug use: No  . Sexual activity: No   Other Topics Concern  . Not on file   Social History Narrative   Patient lives at home alone    Patient is right handed   Patient drinks soda's daily     PHYSICAL EXAM  Vitals:   11/03/16 1345  BP: 113/78  Pulse: 97  Weight: 211 lb 3.2 oz (95.8 kg)  Height:  (1.6 m)   Body mass index is 37.41 kg/m. General: well developed, obese female seated, in no evident distress  Head: head normocephalic and atraumatic. Oropharynx benign  Neck: supple with no carotid  bruits  Cardiovascular: regular rate and rhythm, no murmurs  Neurologic Exam  Mental Status: Awake and fully alert. Oriented to place and time. Follows all commands, speech and language are normal  Cranial Nerves: Pupils equal, briskly reactive to light. Extraocular movements full without nystagmus. Visual fields full to confrontation. Hearing intact and symmetric to finger snap. Facial sensation intact. Face, tongue, palate move normally and symmetrically. Neck flexion and extension normal.  Motor: Normal bulk and tone. Normal strength in all tested extremity muscles. No focal weakness  Sensory.: intact to touch and pinprick and vibratory in the upper and lower extremities.  Coordination: Rapid alternating movements normal in all extremities. Finger-to-nose and heel-to-shin performed accurately bilaterally.  Gait and Station: Arises from chair without difficulty. Stance is normal. Gait demonstrates normal stride length and balance . Able to heel, toe and tandem walk without difficulty.  Reflexes: 2+ and symmetric. Toes downgoing.   DIAGNOSTIC DATA (LABS,  IMAGING, TESTING)  ASSESSMENT AND PLAN  46 y.o. year old female  has a past medical history of Hypertension; High cholesterol; Bipolar 1 disorder (HCC); Depression; Anxiety; Headache(784.0); PCOS (polycystic ovarian syndrome); here to follow up. Her headaches are in good  control  on Topamax. Sleep study confirmed obstructive sleep apnea she is now on CPAP    PLAN:Continue Topamax at current dose  Continue CPAP at current settings Call for worsening of headaches Exercise for overall health and well being  Congratulations on weight loss F/U with me in 6 months I spent 20 min  in total face to face time with the patient more than 50% of which was spent counseling and coordination of care, reviewing test results reviewing medications and discussing and reviewing the diagnosis of migraine and we may try to reduce her medications in the future once she has adjusted to her CPAP since her headaches are so much better , Harriett Sine  Cecille Rubin, [facility], [facility], APRN  [facility] [address], [place], [place] [address] [phone]

## 2016-11-10 NOTE — Progress Notes (Signed)
I have reviewed and agreed above plan. 

## 2016-11-14 ENCOUNTER — Encounter: Payer: Self-pay | Admitting: Nurse Practitioner

## 2016-11-25 ENCOUNTER — Other Ambulatory Visit (HOSPITAL_COMMUNITY): Payer: Self-pay

## 2016-11-25 ENCOUNTER — Other Ambulatory Visit (HOSPITAL_COMMUNITY): Payer: Self-pay | Admitting: Psychiatry

## 2016-11-25 NOTE — Telephone Encounter (Signed)
Medication managememt - Faxes received from [facility] in [place] requesting 90 day medication orders for patient's prescribed WellbutrinXL and Geodon.  Last orders were 30 days for both with 8 refills on 09/22/16.

## 2016-11-25 NOTE — Telephone Encounter (Signed)
Not by patient.  Please ask Dr Driscilla GrammesPolosky

## 2016-11-30 NOTE — Telephone Encounter (Signed)
I called the pharmacy and converted the refills to 90 day supplies

## 2017-01-07 DIAGNOSIS — R739 Hyperglycemia, unspecified: Secondary | ICD-10-CM | POA: Diagnosis not present

## 2017-01-07 DIAGNOSIS — E782 Mixed hyperlipidemia: Secondary | ICD-10-CM | POA: Diagnosis not present

## 2017-01-07 DIAGNOSIS — K219 Gastro-esophageal reflux disease without esophagitis: Secondary | ICD-10-CM | POA: Diagnosis not present

## 2017-01-07 DIAGNOSIS — F331 Major depressive disorder, recurrent, moderate: Secondary | ICD-10-CM | POA: Diagnosis not present

## 2017-01-07 DIAGNOSIS — I10 Essential (primary) hypertension: Secondary | ICD-10-CM | POA: Diagnosis not present

## 2017-01-07 DIAGNOSIS — E781 Pure hyperglyceridemia: Secondary | ICD-10-CM | POA: Diagnosis not present

## 2017-01-12 DIAGNOSIS — Z23 Encounter for immunization: Secondary | ICD-10-CM | POA: Diagnosis not present

## 2017-01-12 DIAGNOSIS — Z6838 Body mass index (BMI) 38.0-38.9, adult: Secondary | ICD-10-CM | POA: Diagnosis not present

## 2017-01-12 DIAGNOSIS — E282 Polycystic ovarian syndrome: Secondary | ICD-10-CM | POA: Diagnosis not present

## 2017-01-12 DIAGNOSIS — K219 Gastro-esophageal reflux disease without esophagitis: Secondary | ICD-10-CM | POA: Diagnosis not present

## 2017-01-12 DIAGNOSIS — M545 Low back pain: Secondary | ICD-10-CM | POA: Diagnosis not present

## 2017-01-12 DIAGNOSIS — I10 Essential (primary) hypertension: Secondary | ICD-10-CM | POA: Diagnosis not present

## 2017-01-12 DIAGNOSIS — E782 Mixed hyperlipidemia: Secondary | ICD-10-CM | POA: Diagnosis not present

## 2017-01-17 ENCOUNTER — Encounter: Payer: Self-pay | Admitting: Nurse Practitioner

## 2017-01-18 ENCOUNTER — Encounter: Payer: Self-pay | Admitting: Nurse Practitioner

## 2017-01-18 ENCOUNTER — Ambulatory Visit (INDEPENDENT_AMBULATORY_CARE_PROVIDER_SITE_OTHER): Payer: Medicare Other | Admitting: Nurse Practitioner

## 2017-01-18 VITALS — BP 102/69 | HR 96 | Ht 63.0 in | Wt 210.4 lb

## 2017-01-18 DIAGNOSIS — G43009 Migraine without aura, not intractable, without status migrainosus: Secondary | ICD-10-CM | POA: Diagnosis not present

## 2017-01-18 DIAGNOSIS — Z9989 Dependence on other enabling machines and devices: Secondary | ICD-10-CM

## 2017-01-18 DIAGNOSIS — G4733 Obstructive sleep apnea (adult) (pediatric): Secondary | ICD-10-CM

## 2017-01-18 NOTE — Patient Instructions (Addendum)
Continue Topamax at current dose  Continue CPAP at current settings 83% compliance, encouraged to use every night F/U with me as already planned in 6 months will recheck compliance at that time

## 2017-01-18 NOTE — Progress Notes (Signed)
[facility]  PATIENT: FRANCENA ZENDER DOB: 1970/09/27   REASON FOR VISIT: episodic headache, morning headaches obstructive sleep apnea with CPAP HISTORY FROM:patient    HISTORY OF PRESENT ILLNESS:[name], 46 year old female returns for  followup. She has a history of headaches. .Headaches  are in good  control. She was asked to get a sleep study at her last visit and  her sleep study shows mild sleep apnea,  hypoxemia and her apnea was supine and REM sleep dependent. .  Overall her sleepiness and daytime drowsiness has decreased significantly. Her headaches are much better control  She is currently taking Topamax 50 twice a day. She has significant bipolar disorder and sees psychiatry Dr. Donell Beers. She also has a past medical history of hypertension, , depression and anxiety, hyperlipidemia, chronic low back pain, lumbar laminectomy in June 2016  by Dr. Jeral Fruit. She also has a history of polycystic disease and obesity. She denies any motor or sensory deficits.   She is here today for CPAP compliance. Data dated 05/27/20182 01/17/2017 shows 83% compliance.25 out of 30 , greater than 4 hours 43% less than 4 hours 40%. Average hours used 4 hours 52 minutes. AutoSet 5 cm to 10 cm. EPR level 3. AHI of 4.8 no leaks. She reports that she has still getting used to the machine and that some nights she falls asleep before putting on. In addition she had a cold and did not use it.She claims she has less fatigue.She returns for  reevaluation .   REVIEW OF SYSTEMS: Full 14 system review of systems performed and notable only for those listed, all others are neg:  Constitutional: neg  Cardiovascular: neg Ear/Nose/Throat: neg  Skin: neg Eyes: Blurred vision Respiratory: neg Gastroitestinal: neg  Hematology/Lymphatic: neg  Endocrine: neg Musculoskeletal:back pain Allergy/Immunology: neg Neurological: Headache improved Psychiatric: Depression anxiety, bipolar disorder Sleep :  Obstructive sleep apnea with CPAP   ALLERGIES: Allergies  Allergen Reactions  . Codeine Hives and Swelling  . Morphine And Related Swelling and Rash    7/2: Pt state is OK in small doses and is willing to take Morphine this admission    HOME MEDICATIONS: Outpatient Medications Prior to Visit  Medication Sig Dispense Refill  . acetaminophen (TYLENOL) 500 MG tablet Take 1,000 mg by mouth every 6 (six) hours as needed for mild pain or moderate pain.    [name] Kitchen buPROPion (WELLBUTRIN XL) 300 MG 24 hr tablet Take 1 tablet (300 mg total) by mouth daily. 30 tablet 8  . clonazePAM (KLONOPIN) 1 MG tablet 1  qam   1  q midday   Half qhs 75 tablet 5  . DULoxetine (CYMBALTA) 60 MG capsule Take 2 capsules (120 mg total) by mouth daily. 180 capsule 7  . lisinopril (PRINIVIL,ZESTRIL) 5 MG tablet Take 5 mg by mouth daily.    . metformin (FORTAMET) 500 MG (OSM) 24 hr tablet Take 1,500 mg by mouth daily with breakfast.    . omeprazole (PRILOSEC) 20 MG capsule Take 1 capsule (20 mg total) by mouth daily. 30 capsule 0  . rosuvastatin (CRESTOR) 20 MG tablet Take 20 mg by mouth daily.      [name] Kitchen topiramate (TOPAMAX) 50 MG tablet Take 1 tablet (50 mg total) by mouth 2 (two) times daily. 180 tablet 3  . traZODone (DESYREL) 100 MG tablet 3  qhs 90 tablet 8  . ziprasidone (GEODON) 80 MG capsule Take 1 capsule (80 mg total) by mouth 2 (two) times daily with a meal. 60  capsule 8   No facility-administered medications prior to visit.     PAST MEDICAL HISTORY: Past Medical History:  Diagnosis Date  . Anxiety   . Bipolar 1 disorder (HCC)   . Complication of anesthesia   . Depression   . GERD (gastroesophageal reflux disease)   . H/O hiatal hernia   . Headache(784.0)   . High cholesterol   . Hypertension   . PCOS (polycystic ovarian syndrome)   . PONV (postoperative nausea and vomiting)    nausea  . Sleep apnea     PAST SURGICAL HISTORY: Past Surgical History:  Procedure Laterality Date  . ANAL FISSURE REPAIR      x 3  . BACK SURGERY  2009  . CHOLECYSTECTOMY  07/2002  . KNEE ARTHROSCOPY Right   . LUMBAR LAMINECTOMY/DECOMPRESSION MICRODISCECTOMY Right 01/22/2014   Procedure: LUMBAR FOUR TO FIVE LUMBAR LAMINECTOMY/DECOMPRESSION MICRODISCECTOMY 1 LEVEL;  Surgeon: Karn Cassis, MD;  Location: [facility];  Service: Neurosurgery;  Laterality: Right;  Right L45 diskectomy    FAMILY HISTORY: Family History  Problem Relation Age of Onset  . Anxiety disorder Maternal Grandmother   . Depression Maternal Grandmother     SOCIAL HISTORY: Social History   Social History  . Marital status: Single    Spouse name: [name]  . Number of children: 0  . Years of education: college   Occupational History  . disabled    Social History Main Topics  . Smoking status: Never Smoker  . Smokeless tobacco: Never Used  . Alcohol use No  . Drug use: No  . Sexual activity: No   Other Topics Concern  . Not on file   Social History Narrative   Patient lives at home alone    Patient is right handed   Patient drinks soda's daily     PHYSICAL EXAM  Vitals:   01/18/17 1343  BP: 102/69  Pulse: 96  Weight: 210 lb 6.4 oz (95.4 kg)  Height: 5\' 3"  (1.6 m)   Body mass index is 37.27 kg/m. General: well developed, obese female seated, in no evident distress  Head: head normocephalic and atraumatic. Oropharynx benign  Neck: supple   Cardiovascular: regular rate and rhythm, no murmurs  Neurologic Exam  Mental Status: Awake and fully alert. Oriented to place and time. Follows all commands, speech and language are normal  Cranial Nerves: Pupils equal, briskly reactive to light. Extraocular movements full without nystagmus. Visual fields full to confrontation. Hearing intact and symmetric to finger snap. Facial sensation intact. Face, tongue, palate move normally and symmetrically. Neck flexion and extension normal.  Motor: Normal bulk and tone. Normal strength in all tested extremity muscles. No focal  weakness  Sensory.: intact to touch and pinprick and vibratory in the upper and lower extremities.  Coordination: Rapid alternating movements normal in all extremities. Finger-to-nose and heel-to-shin performed accurately bilaterally.  Gait and Station: Arises from chair without difficulty. Stance is normal. Gait demonstrates normal stride length and balance . Able to heel, toe and tandem walk without difficulty.  Reflexes: 2+ and symmetric. Toes downgoing.   DIAGNOSTIC DATA (LABS, IMAGING, TESTING)  ASSESSMENT AND PLAN  46 y.o. year old female  has a past medical history of Hypertension; High cholesterol; Bipolar 1 disorder (HCC); Depression; Anxiety; Headache(784.0); PCOS (polycystic ovarian syndrome); here to follow up. Her headaches are in good  control  on Topamax. Sleep study confirmed obstructive sleep apnea she is now on CPAP CPAP compliance Data dated 05/27/20182 01/17/2017 shows 83% compliance.25  out of 30 days , greater than 4 hours 43% less than 4 hours 40%. Average hours used 4 hours 52 minutes. AutoSet 5 cm to 10 cm. EPR level 3. AHI of 4.8 no leaks.patient claims she did not use the machine for over a week due to a bad cold.She also says that many nights she falls asleep before putting the mask on   PLAN:Continue Topamax at current dose  Continue CPAP at current settings 83% compliance however patient needs to increase her hours used at night and make  certain that she uses CPAP every night, she claims she is still adapting to the machine F/U with me as already planned in 6 months will recheck compliance at that time I spent 15min  in total face to face time with the patient more than 50% of which was spent counseling and coordination of care, reviewing test results reviewing medications and discussing and reviewing the diagnosis of obstructive sleep apnea and the importance of being compliant. Reviewed compliance data with  her Nilda RiggsNancy Carolyn Veva Grimley, [facility]NP, [facility], APRN  [facility] [address], [address] [place], [place] [address] [phone]

## 2017-01-19 NOTE — Progress Notes (Signed)
I agree with the assessment and plan as directed by NP .The patient is known to me .   Savian Mazon, MD  

## 2017-01-20 ENCOUNTER — Ambulatory Visit (HOSPITAL_COMMUNITY): Payer: Self-pay | Admitting: Psychiatry

## 2017-01-21 ENCOUNTER — Ambulatory Visit (HOSPITAL_COMMUNITY): Payer: Self-pay | Admitting: Psychiatry

## 2017-01-31 DIAGNOSIS — Z1231 Encounter for screening mammogram for malignant neoplasm of breast: Secondary | ICD-10-CM | POA: Diagnosis not present

## 2017-02-24 ENCOUNTER — Other Ambulatory Visit (HOSPITAL_COMMUNITY): Payer: Self-pay | Admitting: Psychiatry

## 2017-03-30 ENCOUNTER — Ambulatory Visit (INDEPENDENT_AMBULATORY_CARE_PROVIDER_SITE_OTHER): Payer: Medicare Other | Admitting: Psychiatry

## 2017-03-30 ENCOUNTER — Encounter (HOSPITAL_COMMUNITY): Payer: Self-pay | Admitting: Psychiatry

## 2017-03-30 VITALS — BP 104/73 | HR 105 | Ht 63.0 in | Wt 209.0 lb

## 2017-03-30 DIAGNOSIS — F323 Major depressive disorder, single episode, severe with psychotic features: Secondary | ICD-10-CM | POA: Diagnosis not present

## 2017-03-30 DIAGNOSIS — Z818 Family history of other mental and behavioral disorders: Secondary | ICD-10-CM | POA: Diagnosis not present

## 2017-03-30 MED ORDER — BUPROPION HCL ER (XL) 300 MG PO TB24: 300.0000 mg | ORAL_TABLET | Freq: Every day | ORAL | 8 refills | Status: DC

## 2017-03-30 MED ORDER — DULOXETINE HCL 60 MG PO CPEP: 120.0000 mg | ORAL_CAPSULE | Freq: Every day | ORAL | 7 refills | Status: DC

## 2017-03-30 MED ORDER — TRAZODONE HCL 100 MG PO TABS: ORAL_TABLET | ORAL | 8 refills | Status: DC

## 2017-03-30 MED ORDER — CLONAZEPAM 1 MG PO TABS: ORAL_TABLET | ORAL | 5 refills | Status: DC

## 2017-03-30 MED ORDER — ZIPRASIDONE HCL 80 MG PO CAPS: 80.0000 mg | ORAL_CAPSULE | Freq: Two times a day (BID) | ORAL | 8 refills | Status: DC

## 2017-03-30 NOTE — Progress Notes (Signed)
Patient ID: Jamie [name], female   DOB: November 16, 1970, 46 y.o.   MRN: [id number] [facility] MD Progress Note  03/30/2017 1:36 PM Jamie [name]  MRN:  [id number] Subjective:  Feeling well. Principal Problem: Past Psychiatric History:  At this time the patient is actually doing very well. She actually has developed a new relationship. She's been going out with a new boyfriend named [name] for the last 6 months. He apparently proposed her plan is to get married in December in 2 months. The patient feels very strongly about [name]. She says she loves and communicates and negotiate well. She trusted. He has been married before. He is on disability. He's a few years younger than her age 41. The patient says her mood is good. She is sleeping and eating well. She's got good energy. She has a good ability to enjoy reading walking and quilting. The patient is no evidence of psychosis. The patient is being treated for hypercholesterolemia and hypertension but has no diabetes. The patient denies the use of alcohol or drugs and does not smoke cigarettes. The patient takes her medicine as prescribed. Today we talked about her plans to reduce her medicines. She had no problems coming out of her Klonopin down to taking 1 mg one in the morning one midday at night. We were planning to reduce it further but for now will continue all her medications the present dose until after the wedding.  t Medical History:  Past Medical History:  Diagnosis Date  . Anxiety   . Bipolar 1 disorder (HCC)   . Complication of anesthesia   . Depression   . GERD (gastroesophageal reflux disease)   . H/O hiatal hernia   . Headache(784.0)   . High cholesterol   . Hypertension   . PCOS (polycystic ovarian syndrome)   . PONV (postoperative nausea and vomiting)    nausea  . Sleep apnea     Past Surgical History:  Procedure Laterality Date  . ANAL FISSURE REPAIR     x 3  . BACK SURGERY  2009  . CHOLECYSTECTOMY  07/2002  . KNEE ARTHROSCOPY  Right   . LUMBAR LAMINECTOMY/DECOMPRESSION MICRODISCECTOMY Right 01/22/2014   Procedure: LUMBAR FOUR TO FIVE LUMBAR LAMINECTOMY/DECOMPRESSION MICRODISCECTOMY 1 LEVEL;  Surgeon: Karn Cassis, MD;  Location: [facility];  Service: Neurosurgery;  Laterality: Right;  Right L45 diskectomy   Family History:  Family History  Problem Relation Age of Onset  . Anxiety disorder Maternal Grandmother   . Depression Maternal Grandmother    Family Psychiatric  History:  Social History:  History  Alcohol Use No     History  Drug Use No    Social History   Social History  . Marital status: Single    Spouse name: [name]  . Number of children: 0  . Years of education: college   Occupational History  . disabled    Social History Main Topics  . Smoking status: Never Smoker  . Smokeless tobacco: Never Used  . Alcohol use No  . Drug use: No  . Sexual activity: No   Other Topics Concern  . None   Social History Narrative   Patient lives at home alone    Patient is right handed   Patient drinks soda's daily   Additional Social History:                         Sleep: Fair  Appetite:  Good  Current Medications:  Current Outpatient Prescriptions  Medication Sig Dispense Refill  . acetaminophen (TYLENOL) 500 MG tablet Take 1,000 mg by mouth every 6 (six) hours as needed for mild pain or moderate pain.    [name] Kitchen buPROPion (WELLBUTRIN XL) 300 MG 24 hr tablet Take 1 tablet (300 mg total) by mouth daily. 30 tablet 8  . clonazePAM (KLONOPIN) 1 MG tablet 1  qam   1  q midday   Half qhs 75 tablet 5  . DULoxetine (CYMBALTA) 60 MG capsule Take 2 capsules (120 mg total) by mouth daily. 180 capsule 7  . lisinopril (PRINIVIL,ZESTRIL) 5 MG tablet Take 5 mg by mouth daily.    . metformin (FORTAMET) 500 MG (OSM) 24 hr tablet Take 1,500 mg by mouth daily with breakfast.    . omeprazole (PRILOSEC) 20 MG capsule Take 1 capsule (20 mg total) by mouth daily. 30 capsule 0  . rosuvastatin (CRESTOR) 20  MG tablet Take 20 mg by mouth daily.      [name] Kitchen topiramate (TOPAMAX) 50 MG tablet Take 1 tablet (50 mg total) by mouth 2 (two) times daily. 180 tablet 3  . traZODone (DESYREL) 100 MG tablet 3  qhs 90 tablet 8  . ziprasidone (GEODON) 80 MG capsule Take 1 capsule (80 mg total) by mouth 2 (two) times daily with a meal. 60 capsule 8   No current facility-administered medications for this visit.     Lab Results: No results found for this or any previous visit (from the past 48 hour(s)).  Physical Findings: AIMS:  , ,  ,  ,    CIWA:    COWS:     Musculoskeletal: Strength & Muscle Tone: within normal limits Gait & Station: normal Patient leans: Right  Psychiatric Specialty Exam: ROS  Blood pressure 104/73, pulse (!) 105, height 5\' 3"  (1.6 m), weight 209 lb (94.8 kg).Body mass index is 37.02 kg/m.  General Appearance: Casual  Eye Contact::  Good  Speech:  Clear and Coherent  Volume:  Normal  Mood:  Euthymic  Affect:  Appropriate  Today the patient is doing very well. Thought Process:  Coherent  Orientation:  Full (Time, Place, and Person)  Thought Content:  NA and WDL  Suicidal Thoughts:  No  Homicidal Thoughts:  No  Memory:  Immediate;   NA  Judgement:  Good  Insight:  Good  Psychomotor Activity:  NA  Concentration:  Good  Recall:  Good  Fund of Knowledge:Good  Language: Good  Akathisia:  No  Handed:  Right  AIMS (if indicated):     Assets:  Desire for Improvement  ADL's:  Intact  Cognition: WNL  Sleep:      Treatment Plan Summary: 03/30/2017, 1:36 PM  Today the patient is doing very well. Eventually we'll plan to reduce her medications. It should be noted that she's had 6 psychiatric hospitalizations. They were very tense hospitalizations. They're characterized by depression suicidal hallucinations she's been well since 2013 taking the medication she's taking atenolol has been changing her medicines specifically when she's going to getting married next few months. We'll  continue the lower dose of Klonopin 1 mg taking 1 in the morning one midday and a half  at night . It is future will plan to change to a half 3 times a day. For now continue taking Geodon 80 mg. That'll be our next medicines also consider reducing. She'll continue taking trazodone 300 mg she'll continue taking Wellbutrin 300 mg she'll continue taking Cymbalta 60 mg 2 at day.  See her back she'll have been married been on meter husband. She seems to be very stable. Although this does be quick marriage and that she's only known  mon in just 5 months  she seems to believe what she's doing.  this patient return to see me in 4 months. We will get her to sign a release to get all her blood work from her primary care doctor Dr.  [place]  [place]the patient is not suicidal. She is very stable. She is no evidence of psychosis.

## 2017-04-29 DIAGNOSIS — E781 Pure hyperglyceridemia: Secondary | ICD-10-CM | POA: Diagnosis not present

## 2017-04-29 DIAGNOSIS — F331 Major depressive disorder, recurrent, moderate: Secondary | ICD-10-CM | POA: Diagnosis not present

## 2017-04-29 DIAGNOSIS — R739 Hyperglycemia, unspecified: Secondary | ICD-10-CM | POA: Diagnosis not present

## 2017-04-29 DIAGNOSIS — E782 Mixed hyperlipidemia: Secondary | ICD-10-CM | POA: Diagnosis not present

## 2017-04-29 DIAGNOSIS — I10 Essential (primary) hypertension: Secondary | ICD-10-CM | POA: Diagnosis not present

## 2017-04-29 DIAGNOSIS — K219 Gastro-esophageal reflux disease without esophagitis: Secondary | ICD-10-CM | POA: Diagnosis not present

## 2017-05-04 DIAGNOSIS — R739 Hyperglycemia, unspecified: Secondary | ICD-10-CM | POA: Diagnosis not present

## 2017-05-04 DIAGNOSIS — E282 Polycystic ovarian syndrome: Secondary | ICD-10-CM | POA: Diagnosis not present

## 2017-05-04 DIAGNOSIS — I10 Essential (primary) hypertension: Secondary | ICD-10-CM | POA: Diagnosis not present

## 2017-05-04 DIAGNOSIS — Z6837 Body mass index (BMI) 37.0-37.9, adult: Secondary | ICD-10-CM | POA: Diagnosis not present

## 2017-05-04 DIAGNOSIS — M545 Low back pain: Secondary | ICD-10-CM | POA: Diagnosis not present

## 2017-05-04 DIAGNOSIS — E782 Mixed hyperlipidemia: Secondary | ICD-10-CM | POA: Diagnosis not present

## 2017-05-04 DIAGNOSIS — K219 Gastro-esophageal reflux disease without esophagitis: Secondary | ICD-10-CM | POA: Diagnosis not present

## 2017-05-04 DIAGNOSIS — Z01419 Encounter for gynecological examination (general) (routine) without abnormal findings: Secondary | ICD-10-CM | POA: Diagnosis not present

## 2017-05-04 DIAGNOSIS — Z23 Encounter for immunization: Secondary | ICD-10-CM | POA: Diagnosis not present

## 2017-05-09 ENCOUNTER — Ambulatory Visit: Payer: Medicare Other | Admitting: Nurse Practitioner

## 2017-05-27 ENCOUNTER — Other Ambulatory Visit: Payer: Self-pay | Admitting: Nurse Practitioner

## 2017-05-30 DIAGNOSIS — H9203 Otalgia, bilateral: Secondary | ICD-10-CM | POA: Diagnosis not present

## 2017-05-30 DIAGNOSIS — H6093 Unspecified otitis externa, bilateral: Secondary | ICD-10-CM | POA: Diagnosis not present

## 2017-05-30 DIAGNOSIS — R509 Fever, unspecified: Secondary | ICD-10-CM | POA: Diagnosis not present

## 2017-06-21 DIAGNOSIS — R300 Dysuria: Secondary | ICD-10-CM | POA: Diagnosis not present

## 2017-06-21 DIAGNOSIS — Z6838 Body mass index (BMI) 38.0-38.9, adult: Secondary | ICD-10-CM | POA: Diagnosis not present

## 2017-07-11 ENCOUNTER — Ambulatory Visit: Payer: Medicare Other | Admitting: Nurse Practitioner

## 2017-08-03 ENCOUNTER — Ambulatory Visit (INDEPENDENT_AMBULATORY_CARE_PROVIDER_SITE_OTHER): Payer: Medicare Other | Admitting: Psychiatry

## 2017-08-03 ENCOUNTER — Encounter (HOSPITAL_COMMUNITY): Payer: Self-pay | Admitting: Psychiatry

## 2017-08-03 VITALS — BP 126/80 | HR 100 | Ht 63.0 in | Wt 218.0 lb

## 2017-08-03 DIAGNOSIS — F323 Major depressive disorder, single episode, severe with psychotic features: Secondary | ICD-10-CM

## 2017-08-03 DIAGNOSIS — F333 Major depressive disorder, recurrent, severe with psychotic symptoms: Secondary | ICD-10-CM | POA: Diagnosis not present

## 2017-08-03 DIAGNOSIS — Z736 Limitation of activities due to disability: Secondary | ICD-10-CM | POA: Diagnosis not present

## 2017-08-03 DIAGNOSIS — Z818 Family history of other mental and behavioral disorders: Secondary | ICD-10-CM

## 2017-08-03 MED ORDER — ZIPRASIDONE HCL 80 MG PO CAPS
80.0000 mg | ORAL_CAPSULE | Freq: Two times a day (BID) | ORAL | 8 refills | Status: DC
Start: 2017-08-03 — End: 2018-01-17

## 2017-08-03 MED ORDER — CLONAZEPAM 1 MG PO TABS: ORAL_TABLET | ORAL | 5 refills | Status: DC

## 2017-08-03 MED ORDER — DULOXETINE HCL 60 MG PO CPEP: 120.0000 mg | ORAL_CAPSULE | Freq: Every day | ORAL | 7 refills | Status: DC

## 2017-08-03 MED ORDER — TRAZODONE HCL 100 MG PO TABS: ORAL_TABLET | ORAL | 8 refills | Status: DC

## 2017-08-03 MED ORDER — BUPROPION HCL ER (XL) 150 MG PO TB24: 300.0000 mg | ORAL_TABLET | Freq: Every day | ORAL | 5 refills | Status: DC
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
## 2017-08-13 ENCOUNTER — Other Ambulatory Visit: Payer: Self-pay | Admitting: Neurology

## 2017-08-26 ENCOUNTER — Telehealth (HOSPITAL_COMMUNITY): Payer: Self-pay

## 2017-08-26 NOTE — Telephone Encounter (Signed)
Medication refill request - Fax from patient's [facility] pharmacy requesting 90 day refill orders for patient's prescribed Bupropion XL 300 mg.

## 2017-09-02 DIAGNOSIS — M545 Low back pain: Secondary | ICD-10-CM | POA: Diagnosis not present

## 2017-09-02 DIAGNOSIS — R7309 Other abnormal glucose: Secondary | ICD-10-CM | POA: Diagnosis not present

## 2017-09-02 DIAGNOSIS — K219 Gastro-esophageal reflux disease without esophagitis: Secondary | ICD-10-CM | POA: Diagnosis not present

## 2017-09-02 DIAGNOSIS — Z1389 Encounter for screening for other disorder: Secondary | ICD-10-CM | POA: Diagnosis not present

## 2017-09-02 DIAGNOSIS — I10 Essential (primary) hypertension: Secondary | ICD-10-CM | POA: Diagnosis not present

## 2017-09-02 DIAGNOSIS — Z6837 Body mass index (BMI) 37.0-37.9, adult: Secondary | ICD-10-CM | POA: Diagnosis not present

## 2017-09-02 DIAGNOSIS — E782 Mixed hyperlipidemia: Secondary | ICD-10-CM | POA: Diagnosis not present

## 2017-09-02 DIAGNOSIS — J0100 Acute maxillary sinusitis, unspecified: Secondary | ICD-10-CM | POA: Diagnosis not present

## 2017-09-02 DIAGNOSIS — R739 Hyperglycemia, unspecified: Secondary | ICD-10-CM | POA: Diagnosis not present

## 2017-09-02 DIAGNOSIS — E282 Polycystic ovarian syndrome: Secondary | ICD-10-CM | POA: Diagnosis not present

## 2017-10-05 ENCOUNTER — Encounter (HOSPITAL_COMMUNITY): Payer: Self-pay | Admitting: Psychiatry

## 2017-10-05 ENCOUNTER — Ambulatory Visit (INDEPENDENT_AMBULATORY_CARE_PROVIDER_SITE_OTHER): Payer: Medicare Other | Admitting: Psychiatry

## 2017-10-05 VITALS — BP 104/68 | HR 94 | Ht 63.0 in | Wt 220.0 lb

## 2017-10-05 DIAGNOSIS — Z736 Limitation of activities due to disability: Secondary | ICD-10-CM | POA: Diagnosis not present

## 2017-10-05 DIAGNOSIS — F323 Major depressive disorder, single episode, severe with psychotic features: Secondary | ICD-10-CM

## 2017-10-05 DIAGNOSIS — F324 Major depressive disorder, single episode, in partial remission: Secondary | ICD-10-CM | POA: Diagnosis not present

## 2017-10-05 DIAGNOSIS — Z818 Family history of other mental and behavioral disorders: Secondary | ICD-10-CM

## 2017-10-05 MED ORDER — TRAZODONE HCL 100 MG PO TABS: ORAL_TABLET | ORAL | 8 refills | Status: DC

## 2017-10-05 MED ORDER — DULOXETINE HCL 60 MG PO CPEP: 120.0000 mg | ORAL_CAPSULE | Freq: Every day | ORAL | 7 refills | Status: DC

## 2017-10-05 MED ORDER — BUPROPION HCL ER (XL) 150 MG PO TB24: 300.0000 mg | ORAL_TABLET | Freq: Every day | ORAL | 5 refills | Status: DC

## 2017-10-05 MED ORDER — CLONAZEPAM 1 MG PO TABS: ORAL_TABLET | ORAL | 5 refills | Status: DC

## 2017-10-05 MED ORDER — DULOXETINE HCL 60 MG PO CPEP
120.0000 mg | ORAL_CAPSULE | Freq: Every day | ORAL | 7 refills | Status: DC
Start: 2017-10-05 — End: 2017-10-05
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
## 2017-10-18 ENCOUNTER — Ambulatory Visit: Payer: Medicare Other | Admitting: Nurse Practitioner

## 2017-11-11 ENCOUNTER — Ambulatory Visit: Payer: Self-pay | Admitting: Nurse Practitioner

## 2017-11-23 ENCOUNTER — Other Ambulatory Visit (HOSPITAL_COMMUNITY): Payer: Self-pay | Admitting: Psychiatry

## 2017-11-23 DIAGNOSIS — F323 Major depressive disorder, single episode, severe with psychotic features: Secondary | ICD-10-CM

## 2017-12-12 ENCOUNTER — Telehealth: Payer: Self-pay | Admitting: Neurology

## 2017-12-12 NOTE — Telephone Encounter (Signed)
Pt called to advise her CPAP is gurgling when turned on for the past 2 weeks. Pt said she has tried to get in touch with [facility], has left several msg but has not rec'd a call back. Pt has not been able to use the CPAP for the past 2 sweeks. Please call to advise

## 2017-12-12 NOTE — Telephone Encounter (Signed)
I called the pt to let her know I have reached out to [employer] and they should be contacting her soon. She didn't answer. LVM for her informing her that I have contacted them personally and someone should be reaching out to her within 24 hours. If not she should call us back.

## 2018-01-02 DIAGNOSIS — E782 Mixed hyperlipidemia: Secondary | ICD-10-CM | POA: Diagnosis not present

## 2018-01-02 DIAGNOSIS — K219 Gastro-esophageal reflux disease without esophagitis: Secondary | ICD-10-CM | POA: Diagnosis not present

## 2018-01-02 DIAGNOSIS — F331 Major depressive disorder, recurrent, moderate: Secondary | ICD-10-CM | POA: Diagnosis not present

## 2018-01-02 DIAGNOSIS — R7301 Impaired fasting glucose: Secondary | ICD-10-CM | POA: Diagnosis not present

## 2018-01-02 DIAGNOSIS — E781 Pure hyperglyceridemia: Secondary | ICD-10-CM | POA: Diagnosis not present

## 2018-01-02 DIAGNOSIS — I10 Essential (primary) hypertension: Secondary | ICD-10-CM | POA: Diagnosis not present

## 2018-01-05 ENCOUNTER — Ambulatory Visit (HOSPITAL_COMMUNITY): Payer: Self-pay | Admitting: Psychiatry

## 2018-01-10 DIAGNOSIS — Z6841 Body Mass Index (BMI) 40.0 and over, adult: Secondary | ICD-10-CM | POA: Diagnosis not present

## 2018-01-10 DIAGNOSIS — R7301 Impaired fasting glucose: Secondary | ICD-10-CM | POA: Diagnosis not present

## 2018-01-10 DIAGNOSIS — E782 Mixed hyperlipidemia: Secondary | ICD-10-CM | POA: Diagnosis not present

## 2018-01-10 DIAGNOSIS — J0100 Acute maxillary sinusitis, unspecified: Secondary | ICD-10-CM | POA: Diagnosis not present

## 2018-01-10 DIAGNOSIS — I10 Essential (primary) hypertension: Secondary | ICD-10-CM | POA: Diagnosis not present

## 2018-01-10 DIAGNOSIS — E282 Polycystic ovarian syndrome: Secondary | ICD-10-CM | POA: Diagnosis not present

## 2018-01-10 DIAGNOSIS — K219 Gastro-esophageal reflux disease without esophagitis: Secondary | ICD-10-CM | POA: Diagnosis not present

## 2018-01-10 DIAGNOSIS — M545 Low back pain: Secondary | ICD-10-CM | POA: Diagnosis not present

## 2018-01-17 ENCOUNTER — Encounter (HOSPITAL_COMMUNITY): Payer: Self-pay | Admitting: Psychiatry

## 2018-01-17 ENCOUNTER — Ambulatory Visit (INDEPENDENT_AMBULATORY_CARE_PROVIDER_SITE_OTHER): Payer: Medicare Other | Admitting: Psychiatry

## 2018-01-17 VITALS — BP 132/80 | HR 114 | Ht 63.0 in | Wt 226.0 lb

## 2018-01-17 DIAGNOSIS — Z79899 Other long term (current) drug therapy: Secondary | ICD-10-CM | POA: Diagnosis not present

## 2018-01-17 DIAGNOSIS — F3340 Major depressive disorder, recurrent, in remission, unspecified: Secondary | ICD-10-CM | POA: Diagnosis not present

## 2018-01-17 DIAGNOSIS — F323 Major depressive disorder, single episode, severe with psychotic features: Secondary | ICD-10-CM | POA: Diagnosis not present

## 2018-01-17 DIAGNOSIS — Z818 Family history of other mental and behavioral disorders: Secondary | ICD-10-CM | POA: Diagnosis not present

## 2018-01-17 MED ORDER — ZIPRASIDONE HCL 80 MG PO CAPS: 80.0000 mg | ORAL_CAPSULE | Freq: Two times a day (BID) | ORAL | 8 refills | Status: DC

## 2018-01-17 MED ORDER — BUPROPION HCL ER (XL) 150 MG PO TB24: 300.0000 mg | ORAL_TABLET | Freq: Every day | ORAL | 5 refills | Status: DC

## 2018-01-17 MED ORDER — CLONAZEPAM 1 MG PO TABS: ORAL_TABLET | ORAL | 5 refills | Status: DC

## 2018-01-17 MED ORDER — TRAZODONE HCL 100 MG PO TABS: ORAL_TABLET | ORAL | 8 refills | Status: DC

## 2018-01-17 MED ORDER — DULOXETINE HCL 60 MG PO CPEP: 120.0000 mg | ORAL_CAPSULE | Freq: Every day | ORAL | 7 refills | Status: DC

## 2018-01-17 NOTE — Progress Notes (Signed)
Patient ID: Jamie [name], female   DOB: 1970-11-21, 47 y.o.   MRN: [id number] [facility]  01/17/2018 2:38 PM Jamie FloridaMelinda K Tregre  MRN:  [id number] Subjective:  Feeling well. Principal Problem: Past Psychiatric History:  Today the patient is doing fairly well. She is seen alone. Peyton NajjarLarry her husband is in the waiting room. The patient's mood is stable. She is sleeping and eating well. Her biggest stress is that her nephew who was living with her has gone back prison because of parole violation. The patient is active and energetic watches TV and reads and walks. She can think and concentrate without a problem. Is a good sense of worth. She denies being suicidal. The last time she was psychiatrically hospitalized 2013 when she was depressed and suicidal. The patient demonstrates no evidence of tardive dyskinesia. She does not have diabetes has well-controlled hypertension poorly controlled hypercholesterolemia. The patient's relationship with her husband Peyton NajjarLarry is good overall the patient is happy with life. Patient takes her medicines just as prescribed. She denies chest pain or shortness of breath. She does not smoke. She is no family history of heart disease. t Medical History:  Past Medical History:  Diagnosis Date  . Anxiety   . Bipolar 1 disorder (HCC)   . Complication of anesthesia   . Depression   . GERD (gastroesophageal reflux disease)   . H/O hiatal hernia   . Headache(784.0)   . High cholesterol   . Hypertension   . PCOS (polycystic ovarian syndrome)   . PONV (postoperative nausea and vomiting)    nausea  . Sleep apnea     Past Surgical History:  Procedure Laterality Date  . ANAL FISSURE REPAIR     x 3  . BACK SURGERY  2009  . CHOLECYSTECTOMY  07/2002  . KNEE ARTHROSCOPY Right   . LUMBAR LAMINECTOMY/DECOMPRESSION MICRODISCECTOMY Right 01/22/2014   Procedure: LUMBAR FOUR TO FIVE LUMBAR LAMINECTOMY/DECOMPRESSION MICRODISCECTOMY 1 LEVEL;  Surgeon: Karn CassisErnesto M Botero, MD;   Location: [facility];  Service: Neurosurgery;  Laterality: Right;  Right L45 diskectomy   Family History:  Family History  Problem Relation Age of Onset  . Anxiety disorder Maternal Grandmother   . Depression Maternal Grandmother    Family Psychiatric  History:  Social History:  Social History   Substance and Sexual Activity  Alcohol Use No     Social History   Substance and Sexual Activity  Drug Use No    Social History   Socioeconomic History  . Marital status: Married    Spouse name: Not on file  . Number of children: 0  . Years of education: college  . Highest education level: Not on file  Occupational History  . Occupation: disabled  Social Needs  . Financial resource strain: Not on file  . Food insecurity:    Worry: Not on file    Inability: Not on file  . Transportation needs:    Medical: Not on file    Non-medical: Not on file  Tobacco Use  . Smoking status: Never Smoker  . Smokeless tobacco: Never Used  Substance and Sexual Activity  . Alcohol use: No  . Drug use: No  . Sexual activity: Yes    Partners: Male    Birth control/protection: None  Lifestyle  . Physical activity:    Days per week: Not on file    Minutes per session: Not on file  . Stress: Not on file  Relationships  . Social connections:  Talks on phone: Not on file    Gets together: Not on file    Attends religious service: Not on file    Active member of club or organization: Not on file    Attends meetings of clubs or organizations: Not on file    Relationship status: Not on file  Other Topics Concern  . Not on file  Social History Narrative   Patient lives at home alone    Patient is right handed   Patient drinks soda's daily   Additional Social History:                         Sleep: Fair  Appetite:  Good  Current Medications: Current Outpatient Medications  Medication Sig Dispense Refill  . acetaminophen (TYLENOL) 500 MG tablet Take 1,000 mg by mouth  every 6 (six) hours as needed for mild pain or moderate pain.    [name] Kitchen buPROPion (WELLBUTRIN XL) 150 MG 24 hr tablet Take 2 tablets (300 mg total) by mouth daily. 90 tablet 5  . clonazePAM (KLONOPIN) 1 MG tablet 1 qam 2  qhs 90 tablet 5  . DULoxetine (CYMBALTA) 60 MG capsule Take 2 capsules (120 mg total) by mouth daily. 180 capsule 7  . lisinopril (PRINIVIL,ZESTRIL) 5 MG tablet Take 5 mg by mouth daily.    . metformin (FORTAMET) 500 MG (OSM) 24 hr tablet Take 1,500 mg by mouth daily with breakfast.    . omeprazole (PRILOSEC) 20 MG capsule Take 1 capsule (20 mg total) by mouth daily. 30 capsule 0  . rosuvastatin (CRESTOR) 20 MG tablet Take 20 mg by mouth daily.      [name] Kitchen topiramate (TOPAMAX) 50 MG tablet TAKE 1 TABLET BY MOUTH TWICE A DAY 180 tablet 0  . traZODone (DESYREL) 100 MG tablet 4 qhs 120 tablet 8  . ziprasidone (GEODON) 80 MG capsule Take 1 capsule (80 mg total) by mouth 2 (two) times daily with a meal. 60 capsule 8   No current facility-administered medications for this visit.     Lab Results: No results found for this or any previous visit (from the past 48 hour(s)).  Physical Findings: AIMS:  , ,  ,  ,    CIWA:    COWS:     Musculoskeletal: Strength & Muscle Tone: within normal limits Gait & Station: normal Patient leans: Right  Psychiatric Specialty Exam: ROS  Blood pressure 132/80, pulse (!) 114, height 5\' 3"  (1.6 m), weight 226 lb (102.5 kg).Body mass index is 40.03 kg/m.  General Appearance: Casual  Eye Contact::  Good  Speech:  Clear and Coherent  Volume:  Normal  Mood:  Euthymic  Affect:  Appropriate  Today the patient is doing very well. Thought Process:  Coherent  Orientation:  Full (Time, Place, and Person)  Thought Content:  NA and WDL  Suicidal Thoughts:  No  Homicidal Thoughts:  No  Memory:  Immediate;   NA  Judgement:  Good  Insight:  Good  Psychomotor Activity:  NA  Concentration:  Good  Recall:  Good  Fund of Knowledge:Good  Language: Good   Akathisia:  No  Handed:  Right  AIMS (if indicated):     Assets:  Desire for Improvement  ADL's:  Intact  Cognition: WNL  Sleep:      Treatment Plan Summary: 01/17/2018, 2:38 PM  At this time the patient is fairly stable. Her major problem is that of major clinical depression and takes all medications  she takes the maximum dose of Wellbutrin 450 mg and takes Cymbalta 120 mg G used for her depression as well as from her back pain. The patient had 2 back surgery and his chronic well-controlle she also takes a opiate prescribed by a pain clinic. The patient clearly feels better taking Klonopin 1 mg one in the morning and 2 at night. She is no history or evidence of using drugs or alcohol. Today we clarify that she takes Geodon 80 mg twice a day. I believe this is more so for depression and overt psychosis today we talked about the possibility of starting to reduce her next visit we'll likely reduce 60 twice a day. The patient is doing fairly well. She is 2 dogs. It should be also noted that her right hand I just a week and the patient seems to be pretty resilient. The patient also is just getting over a strep the throat and took antibiotics which affected her GI tract. Her sore throat is much better but she still having some nausea probably related to the past she should be finishing in the next few days. Emotionally the patient is very stable she does show some resilience.

## 2018-02-02 ENCOUNTER — Telehealth (HOSPITAL_COMMUNITY): Payer: Self-pay | Admitting: Psychiatry

## 2018-02-02 NOTE — Telephone Encounter (Signed)
02/02/18- tried to lvm to reschedule Dr. Donell BeersPlovsky appointment due to provider being out. Patients phone would ring then say that the call couldn't be completed due to number being out of service. ZB

## 2018-02-02 NOTE — Progress Notes (Signed)
[facility]  PATIENT: Jamie [name] DOB: 07-10-1971   REASON FOR VISIT: episodic headache, morning headaches obstructive sleep apnea with CPAP HISTORY FROM:patient    HISTORY OF PRESENT ILLNESS:UPDATE 7/15/2019CM Jamie [name], 47 year old female returns for follow-up with history of obstructive sleep apnea and headaches.  Her headaches are in good control on Topamax she needs refills.  She claims she is doing well with her CPAP however her compliance is suboptimal.  Data dated 12/07/2017-02/04/2018( 3 months) shows compliance greater than 4 hours at 35 days or 58%.  Usage days 75%.  Average usage 6 hours 39 minutes.  Set pressure 5 to 10 cm EPR level 3 AHI 2.7 ESS 6. She returns for reevaluation    01/18/17 [name]. Jamie [name], 47 year old female returns for  followup. She has a history of headaches. .Headaches  are in good  control. She was asked to get a sleep study at her last visit and  her sleep study shows mild sleep apnea,  hypoxemia and her apnea was supine and REM sleep dependent. .  Overall her sleepiness and daytime drowsiness has decreased significantly. Her headaches are much better control  She is currently taking Topamax 50 twice a day. She has significant bipolar disorder and sees psychiatry Dr. Donell Beers. She also has a past medical history of hypertension, , depression and anxiety, hyperlipidemia, chronic low back pain, lumbar laminectomy in June 2016  by Dr. Jeral Fruit. She also has a history of polycystic disease and obesity. She denies any motor or sensory deficits.   She is here today for CPAP compliance. Data dated 05/27/20182 01/17/2017 shows 83% compliance.25 out of 30 , greater than 4 hours 43% less than 4 hours 40%. Average hours used 4 hours 52 minutes. AutoSet 5 cm to 10 cm. EPR level 3. AHI of 4.8 no leaks. She reports that she has still getting used to the machine and that some nights she falls asleep before putting on. In addition she had a cold and did not use  it.She claims she has less fatigue.She returns for  reevaluation .   REVIEW OF SYSTEMS: Full 14 system review of systems performed and notable only for those listed, all others are neg:  Constitutional: neg  Cardiovascular: neg Ear/Nose/Throat: neg  Skin: neg Eyes: Blurred vision Respiratory: neg Gastroitestinal: neg  Hematology/Lymphatic: neg  Endocrine: neg Musculoskeletal:neg Allergy/Immunology: neg Neurological: Headache improved Psychiatric: Depression anxiety, bipolar disorder followed by psychiatry Sleep : Obstructive sleep apnea with CPAP   ALLERGIES: Allergies  Allergen Reactions  . Codeine Hives and Swelling  . Morphine And Related Swelling and Rash    7/2: Pt state is OK in small doses and is willing to take Morphine this admission    HOME MEDICATIONS: Outpatient Medications Prior to Visit  Medication Sig Dispense Refill  . acetaminophen (TYLENOL) 500 MG tablet Take 1,000 mg by mouth every 6 (six) hours as needed for mild pain or moderate pain.    [name] Kitchen buPROPion (WELLBUTRIN XL) 150 MG 24 hr tablet Take 2 tablets (300 mg total) by mouth daily. 90 tablet 5  . clonazePAM (KLONOPIN) 1 MG tablet 1 qam 2  qhs 90 tablet 5  . DULoxetine (CYMBALTA) 60 MG capsule Take 2 capsules (120 mg total) by mouth daily. 180 capsule 7  . lisinopril (PRINIVIL,ZESTRIL) 5 MG tablet Take 5 mg by mouth daily.    . metformin (FORTAMET) 500 MG (OSM) 24 hr tablet Take 1,500 mg by mouth daily with breakfast.    . omeprazole (PRILOSEC) 20 MG  capsule Take 1 capsule (20 mg total) by mouth daily. 30 capsule 0  . rosuvastatin (CRESTOR) 20 MG tablet Take 20 mg by mouth daily.      [name] Kitchen. topiramate (TOPAMAX) 50 MG tablet TAKE 1 TABLET BY MOUTH TWICE A DAY 180 tablet 0  . traZODone (DESYREL) 100 MG tablet 4 qhs 120 tablet 8  . ziprasidone (GEODON) 80 MG capsule Take 1 capsule (80 mg total) by mouth 2 (two) times daily with a meal. 60 capsule 8   No facility-administered medications prior to visit.      PAST MEDICAL HISTORY: Past Medical History:  Diagnosis Date  . Anxiety   . Bipolar 1 disorder (HCC)   . Complication of anesthesia   . Depression   . GERD (gastroesophageal reflux disease)   . H/O hiatal hernia   . Headache(784.0)   . High cholesterol   . Hypertension   . PCOS (polycystic ovarian syndrome)   . PONV (postoperative nausea and vomiting)    nausea  . Sleep apnea    CPAP    PAST SURGICAL HISTORY: Past Surgical History:  Procedure Laterality Date  . ANAL FISSURE REPAIR     x 3  . BACK SURGERY  2009  . CHOLECYSTECTOMY  07/2002  . KNEE ARTHROSCOPY Right   . LUMBAR LAMINECTOMY/DECOMPRESSION MICRODISCECTOMY Right 01/22/2014   Procedure: LUMBAR FOUR TO FIVE LUMBAR LAMINECTOMY/DECOMPRESSION MICRODISCECTOMY 1 LEVEL;  Surgeon: Karn CassisErnesto M Botero, MD;  Location: [facility];  Service: Neurosurgery;  Laterality: Right;  Right L45 diskectomy    FAMILY HISTORY: Family History  Problem Relation Age of Onset  . Anxiety disorder Maternal Grandmother   . Depression Maternal Grandmother     SOCIAL HISTORY: Social History   Socioeconomic History  . Marital status: Married    Spouse name: Not on file  . Number of children: 0  . Years of education: college  . Highest education level: Not on file  Occupational History  . Occupation: disabled  Social Needs  . Financial resource strain: Not on file  . Food insecurity:    Worry: Not on file    Inability: Not on file  . Transportation needs:    Medical: Not on file    Non-medical: Not on file  Tobacco Use  . Smoking status: Never Smoker  . Smokeless tobacco: Never Used  Substance and Sexual Activity  . Alcohol use: No  . Drug use: No  . Sexual activity: Yes    Partners: Male    Birth control/protection: None  Lifestyle  . Physical activity:    Days per week: Not on file    Minutes per session: Not on file  . Stress: Not on file  Relationships  . Social connections:    Talks on phone: Not on file    Gets  together: Not on file    Attends religious service: Not on file    Active member of club or organization: Not on file    Attends meetings of clubs or organizations: Not on file    Relationship status: Not on file  . Intimate partner violence:    Fear of current or ex partner: Not on file    Emotionally abused: Not on file    Physically abused: Not on file    Forced sexual activity: Not on file  Other Topics Concern  . Not on file  Social History Narrative   Patient lives at home alone    Patient is right handed   Patient drinks soda's  daily     PHYSICAL EXAM  Vitals:   02/06/18 1358  BP: 102/72  Pulse: 92  Weight: 234 lb 6.4 oz (106.3 kg)  Height: 5\' 3"  (1.6 m)   Body mass index is 41.52 kg/m. General: well developed, obese female seated, in no evident distress  Head: head normocephalic and atraumatic. Oropharynx benign mallopatti5  Neck: supple  ,Circumference 19 Lungs clear Skin no peripheral edema Neurologic Exam  Mental Status: Awake and fully alert. Oriented to place and time. Follows all commands, speech and language are normal  Cranial Nerves: Pupils equal, briskly reactive to light. Extraocular movements full without nystagmus. Visual fields full to confrontation. Hearing intact and symmetric to finger snap. Facial sensation intact. Face, tongue, palate move normally and symmetrically. Neck flexion and extension normal.  Motor: Normal bulk and tone. Normal strength in all tested extremity muscles. Sensory.: intact to touch  Coordination: Rapid alternating movements normal in all extremities. Finger-to-nose and heel-to-shin performed accurately bilaterally.  Gait and Station: Arises from chair without difficulty. Stance is normal. Gait demonstrates normal stride length and balance . Able to heel, toe and tandem walk without difficulty.  DIAGNOSTIC DATA (LABS, IMAGING, TESTING)  ASSESSMENT AND PLAN  47 y.o. year old female  has a past medical history of  Hypertension; High cholesterol; Bipolar 1 disorder (HCC); Depression; Anxiety; Headache(784.0); PCOS (polycystic ovarian syndrome); here to follow up. Her headaches are in good  control  on Topamax. Sleep study confirmed obstructive sleep apnea. Data dated 12/07/2017-02/04/2018( 3 months) shows compliance greater than 4 hours at 35 days or 58%.  Usage days 75%.  Average usage 6 hours 39 minutes.  Set pressure 5 to 10 cm EPR level 3 AHI 2.7 ESS 6.   PLAN:Continue Topamax at current dose will refill  CPAP compliance 58% needs to be greater than 70  Continue same settings, please use the machine every night F/U in 4 months I spent  in total face to face time with the patient more than 50% of which was spent counseling and coordination of care, reviewing test results reviewing medications and discussing and reviewing the diagnosis of obstructive sleep apnea and the importance of being compliant. Reviewed compliance data with  her Nilda Riggs, [facility], [facility], APRN  [facility] [address], [address] [place], [place] [address] [phone]

## 2018-02-04 ENCOUNTER — Encounter: Payer: Self-pay | Admitting: Nurse Practitioner

## 2018-02-06 ENCOUNTER — Encounter: Payer: Self-pay | Admitting: Nurse Practitioner

## 2018-02-06 ENCOUNTER — Ambulatory Visit (INDEPENDENT_AMBULATORY_CARE_PROVIDER_SITE_OTHER): Payer: Medicare Other | Admitting: Nurse Practitioner

## 2018-02-06 VITALS — BP 102/72 | HR 92 | Ht 63.0 in | Wt 234.4 lb

## 2018-02-06 DIAGNOSIS — G4733 Obstructive sleep apnea (adult) (pediatric): Secondary | ICD-10-CM | POA: Diagnosis not present

## 2018-02-06 DIAGNOSIS — G43009 Migraine without aura, not intractable, without status migrainosus: Secondary | ICD-10-CM

## 2018-02-06 DIAGNOSIS — Z9989 Dependence on other enabling machines and devices: Secondary | ICD-10-CM | POA: Diagnosis not present

## 2018-02-06 MED ORDER — TOPIRAMATE 50 MG PO TABS: 50.0000 mg | ORAL_TABLET | Freq: Two times a day (BID) | ORAL | 3 refills | Status: DC

## 2018-02-06 NOTE — Patient Instructions (Signed)
Continue Topamax at current dose will refill  CPAP compliance 58% needs to be greater than 70  Continue same settings, please use the machine every night F/U in 4 months

## 2018-02-24 NOTE — Progress Notes (Signed)
I agree with the assessment and plan as directed by NP .The patient is known to me .   Fernandez Kenley, MD  

## 2018-03-08 DIAGNOSIS — Z6841 Body Mass Index (BMI) 40.0 and over, adult: Secondary | ICD-10-CM | POA: Diagnosis not present

## 2018-03-08 DIAGNOSIS — R609 Edema, unspecified: Secondary | ICD-10-CM | POA: Diagnosis not present

## 2018-04-27 ENCOUNTER — Other Ambulatory Visit (HOSPITAL_COMMUNITY): Payer: Self-pay | Admitting: Psychiatry

## 2018-04-27 DIAGNOSIS — F323 Major depressive disorder, single episode, severe with psychotic features: Secondary | ICD-10-CM

## 2018-05-03 ENCOUNTER — Ambulatory Visit (HOSPITAL_COMMUNITY): Payer: Self-pay | Admitting: Psychiatry

## 2018-05-09 DIAGNOSIS — K219 Gastro-esophageal reflux disease without esophagitis: Secondary | ICD-10-CM | POA: Diagnosis not present

## 2018-05-09 DIAGNOSIS — R7301 Impaired fasting glucose: Secondary | ICD-10-CM | POA: Diagnosis not present

## 2018-05-09 DIAGNOSIS — E782 Mixed hyperlipidemia: Secondary | ICD-10-CM | POA: Diagnosis not present

## 2018-05-09 DIAGNOSIS — I10 Essential (primary) hypertension: Secondary | ICD-10-CM | POA: Diagnosis not present

## 2018-05-09 DIAGNOSIS — E781 Pure hyperglyceridemia: Secondary | ICD-10-CM | POA: Diagnosis not present

## 2018-05-16 DIAGNOSIS — K219 Gastro-esophageal reflux disease without esophagitis: Secondary | ICD-10-CM | POA: Diagnosis not present

## 2018-05-16 DIAGNOSIS — I10 Essential (primary) hypertension: Secondary | ICD-10-CM | POA: Diagnosis not present

## 2018-05-16 DIAGNOSIS — R7301 Impaired fasting glucose: Secondary | ICD-10-CM | POA: Diagnosis not present

## 2018-05-16 DIAGNOSIS — Z23 Encounter for immunization: Secondary | ICD-10-CM | POA: Diagnosis not present

## 2018-05-16 DIAGNOSIS — E782 Mixed hyperlipidemia: Secondary | ICD-10-CM | POA: Diagnosis not present

## 2018-05-17 ENCOUNTER — Ambulatory Visit (INDEPENDENT_AMBULATORY_CARE_PROVIDER_SITE_OTHER): Payer: Medicare Other | Admitting: Psychiatry

## 2018-05-17 ENCOUNTER — Encounter (HOSPITAL_COMMUNITY): Payer: Self-pay | Admitting: Emergency Medicine

## 2018-05-17 ENCOUNTER — Other Ambulatory Visit: Payer: Self-pay

## 2018-05-17 ENCOUNTER — Encounter (HOSPITAL_COMMUNITY): Payer: Self-pay | Admitting: Psychiatry

## 2018-05-17 ENCOUNTER — Inpatient Hospital Stay (HOSPITAL_COMMUNITY)
Admission: AD | Admit: 2018-05-17 | Discharge: 2018-05-22 | DRG: 885 | Disposition: A | Discharge status: Home / Self Care | Payer: Medicare Other | Source: Intra-hospital | Attending: Psychiatry | Admitting: Psychiatry

## 2018-05-17 ENCOUNTER — Emergency Department (HOSPITAL_COMMUNITY)
Admission: EM | Admit: 2018-05-17 | Discharge: 2018-05-17 | Disposition: A | Discharge status: Other Acute Inpatient Hospital | Payer: Medicare Other | Source: Home / Self Care | Attending: Emergency Medicine | Admitting: Emergency Medicine

## 2018-05-17 VITALS — BP 122/78 | HR 93 | Ht 63.0 in | Wt 248.0 lb

## 2018-05-17 DIAGNOSIS — E282 Polycystic ovarian syndrome: Secondary | ICD-10-CM | POA: Diagnosis present

## 2018-05-17 DIAGNOSIS — Z885 Allergy status to narcotic agent status: Secondary | ICD-10-CM

## 2018-05-17 DIAGNOSIS — F419 Anxiety disorder, unspecified: Secondary | ICD-10-CM

## 2018-05-17 DIAGNOSIS — E119 Type 2 diabetes mellitus without complications: Secondary | ICD-10-CM | POA: Diagnosis present

## 2018-05-17 DIAGNOSIS — F315 Bipolar disorder, current episode depressed, severe, with psychotic features: Secondary | ICD-10-CM | POA: Diagnosis present

## 2018-05-17 DIAGNOSIS — F333 Major depressive disorder, recurrent, severe with psychotic symptoms: Secondary | ICD-10-CM

## 2018-05-17 DIAGNOSIS — E7800 Pure hypercholesterolemia, unspecified: Secondary | ICD-10-CM | POA: Diagnosis present

## 2018-05-17 DIAGNOSIS — G4733 Obstructive sleep apnea (adult) (pediatric): Secondary | ICD-10-CM | POA: Diagnosis present

## 2018-05-17 DIAGNOSIS — I10 Essential (primary) hypertension: Secondary | ICD-10-CM

## 2018-05-17 DIAGNOSIS — Z599 Problem related to housing and economic circumstances, unspecified: Secondary | ICD-10-CM | POA: Diagnosis not present

## 2018-05-17 DIAGNOSIS — Z9049 Acquired absence of other specified parts of digestive tract: Secondary | ICD-10-CM | POA: Insufficient documentation

## 2018-05-17 DIAGNOSIS — F411 Generalized anxiety disorder: Secondary | ICD-10-CM | POA: Diagnosis present

## 2018-05-17 DIAGNOSIS — Z79899 Other long term (current) drug therapy: Secondary | ICD-10-CM

## 2018-05-17 DIAGNOSIS — G4700 Insomnia, unspecified: Secondary | ICD-10-CM | POA: Diagnosis present

## 2018-05-17 DIAGNOSIS — Z818 Family history of other mental and behavioral disorders: Secondary | ICD-10-CM | POA: Diagnosis not present

## 2018-05-17 DIAGNOSIS — F332 Major depressive disorder, recurrent severe without psychotic features: Secondary | ICD-10-CM

## 2018-05-17 DIAGNOSIS — F322 Major depressive disorder, single episode, severe without psychotic features: Secondary | ICD-10-CM

## 2018-05-17 DIAGNOSIS — R45851 Suicidal ideations: Secondary | ICD-10-CM

## 2018-05-17 DIAGNOSIS — G43909 Migraine, unspecified, not intractable, without status migrainosus: Secondary | ICD-10-CM | POA: Diagnosis present

## 2018-05-17 DIAGNOSIS — R451 Restlessness and agitation: Secondary | ICD-10-CM | POA: Diagnosis not present

## 2018-05-17 DIAGNOSIS — J309 Allergic rhinitis, unspecified: Secondary | ICD-10-CM | POA: Diagnosis present

## 2018-05-17 DIAGNOSIS — Z7984 Long term (current) use of oral hypoglycemic drugs: Secondary | ICD-10-CM | POA: Diagnosis not present

## 2018-05-17 DIAGNOSIS — K219 Gastro-esophageal reflux disease without esophagitis: Secondary | ICD-10-CM | POA: Diagnosis not present

## 2018-05-17 LAB — RAPID URINE DRUG SCREEN, HOSP PERFORMED
Amphetamines: NOT DETECTED
BARBITURATES: NOT DETECTED
Benzodiazepines: NOT DETECTED
COCAINE: NOT DETECTED
Opiates: NOT DETECTED
Tetrahydrocannabinol: NOT DETECTED

## 2018-05-17 LAB — BASIC METABOLIC PANEL
ANION GAP: 12 (ref 5–15)
BUN: 21 mg/dL — ABNORMAL HIGH (ref 6–20)
CALCIUM: 9.2 mg/dL (ref 8.9–10.3)
CO2: 23 mmol/L (ref 22–32)
Chloride: 103 mmol/L (ref 98–111)
Creatinine, Ser: 1.06 mg/dL — ABNORMAL HIGH (ref 0.44–1.00)
Glucose, Bld: 136 mg/dL — ABNORMAL HIGH (ref 70–99)
Potassium: 4.5 mmol/L (ref 3.5–5.1)
Sodium: 138 mmol/L (ref 135–145)

## 2018-05-17 LAB — ETHANOL: Alcohol, Ethyl (B): <10 mg/dL (ref <10)

## 2018-05-17 LAB — CBC
HCT: 43.8 % (ref 36.0–46.0)
Hemoglobin: 13.6 g/dL (ref 12.0–15.0)
MCH: 28.3 pg (ref 26.0–34.0)
MCHC: 31.1 g/dL (ref 30.0–36.0)
MCV: 91.1 fL (ref 80.0–100.0)
NRBC: 0 % (ref 0.0–0.2)
PLATELETS: 152 10*3/uL (ref 150–400)
RBC: 4.81 MIL/uL (ref 3.87–5.11)
RDW: 14.8 % (ref 11.5–15.5)
WBC: 7.5 10*3/uL (ref 4.0–10.5)

## 2018-05-17 LAB — I-STAT BETA HCG BLOOD, ED (MC, WL, AP ONLY)

## 2018-05-17 MED ORDER — ROSUVASTATIN CALCIUM 20 MG PO TABS: 20.0000 mg | ORAL_TABLET | Freq: Every day | ORAL | Status: DC

## 2018-05-17 MED ORDER — PANTOPRAZOLE SODIUM 40 MG PO TBEC
40.0000 mg | DELAYED_RELEASE_TABLET | Freq: Every day | ORAL | Status: DC
  Administered 2018-05-17: 40 mg via ORAL
  Filled 2018-05-17: qty 1

## 2018-05-17 MED ORDER — ONDANSETRON HCL 4 MG PO TABS: 4.0000 mg | ORAL_TABLET | Freq: Three times a day (TID) | ORAL | Status: DC | PRN

## 2018-05-17 MED ORDER — BUPROPION HCL ER (XL) 150 MG PO TB24
300.0000 mg | ORAL_TABLET | Freq: Every day | ORAL | Status: DC
  Administered 2018-05-17: 300 mg via ORAL
  Filled 2018-05-17: qty 2

## 2018-05-17 MED ORDER — ACETAMINOPHEN 325 MG PO TABS: 650.0000 mg | ORAL_TABLET | ORAL | Status: DC | PRN

## 2018-05-17 MED ORDER — CLONAZEPAM 1 MG PO TABS
1.0000 mg | ORAL_TABLET | Freq: Two times a day (BID) | ORAL | Status: DC
  Administered 2018-05-17: 1 mg via ORAL
  Filled 2018-05-17: qty 1

## 2018-05-17 MED ORDER — ZOLPIDEM TARTRATE 5 MG PO TABS: 5.0000 mg | ORAL_TABLET | Freq: Every evening | ORAL | Status: DC | PRN

## 2018-05-17 MED ORDER — ZIPRASIDONE HCL 20 MG PO CAPS: 80.0000 mg | ORAL_CAPSULE | Freq: Two times a day (BID) | ORAL | Status: DC

## 2018-05-17 MED ORDER — TRAZODONE HCL 100 MG PO TABS
100.0000 mg | ORAL_TABLET | Freq: Every day | ORAL | Status: DC
  Administered 2018-05-17: 100 mg via ORAL
  Filled 2018-05-17: qty 1

## 2018-05-17 MED ORDER — TOPIRAMATE 25 MG PO TABS
50.0000 mg | ORAL_TABLET | Freq: Two times a day (BID) | ORAL | Status: DC
  Administered 2018-05-17: 50 mg via ORAL
  Filled 2018-05-17: qty 2

## 2018-05-17 MED ORDER — DULOXETINE HCL 30 MG PO CPEP
120.0000 mg | ORAL_CAPSULE | Freq: Every day | ORAL | Status: DC
  Administered 2018-05-17: 120 mg via ORAL
  Filled 2018-05-17: qty 4

## 2018-05-17 MED ORDER — METFORMIN HCL ER 750 MG PO TB24
1500.0000 mg | ORAL_TABLET | Freq: Every day | ORAL | Status: DC
  Filled 2018-05-17: qty 2

## 2018-05-17 MED ORDER — ALUM & MAG HYDROXIDE-SIMETH 200-200-20 MG/5ML PO SUSP: 30.0000 mL | Freq: Four times a day (QID) | ORAL | Status: DC | PRN

## 2018-05-17 MED ORDER — LISINOPRIL 10 MG PO TABS
5.0000 mg | ORAL_TABLET | Freq: Every day | ORAL | Status: DC
  Administered 2018-05-17: 5 mg via ORAL
  Filled 2018-05-17: qty 1

## 2018-05-17 NOTE — ED Provider Notes (Signed)
[facility]-EMERGENCY DEPT Provider Note   CSN: [id number] Arrival date & time: 05/17/18  1828     History   Chief Complaint Chief Complaint  Patient presents with  . Suicidal    HPI Jamie [name] is a 47 y.o. female.  HPI Patient is a 47 year old female who presents to the emergency department with suicidal thoughts.  She was seen by her psychiatrist today who recommended that she come to the ER for evaluation for psychiatric placement for suicidal ideation.  No new medications.  No other complaints.   Past Medical History:  Diagnosis Date  . Anxiety   . Bipolar 1 disorder (HCC)   . Complication of anesthesia   . Depression   . GERD (gastroesophageal reflux disease)   . H/O hiatal hernia   . Headache(784.0)   . High cholesterol   . Hypertension   . PCOS (polycystic ovarian syndrome)   . PONV (postoperative nausea and vomiting)    nausea  . Sleep apnea    CPAP    Patient Active Problem List   Diagnosis Date Noted  . Common migraine 11/03/2016  . OSA on CPAP 11/03/2016  . Lumbar herniated disc 01/22/2014  . Major depressive disorder, recurrent episode, severe, specified as with psychotic behavior 02/14/2013  . Headache 12/06/2012  . Bipolar I disorder, most recent episode (or current) depressed, unspecified 11/15/2012  . Bipolar affective disorder, depressed, mild degree (HCC) 08/16/2012  . Bipolar 1 disorder, depressed, mild (HCC) 08/16/2012  . Severe major depression with psychotic features, mood-congruent (HCC) 05/03/2012  . Schizoaffective disorder, depressive type (HCC) 06/09/2011  . Generalized anxiety disorder 06/09/2011    Past Surgical History:  Procedure Laterality Date  . ANAL FISSURE REPAIR     x 3  . BACK SURGERY  2009  . CHOLECYSTECTOMY  07/2002  . KNEE ARTHROSCOPY Right   . LUMBAR LAMINECTOMY/DECOMPRESSION MICRODISCECTOMY Right 01/22/2014   Procedure: LUMBAR FOUR TO FIVE LUMBAR LAMINECTOMY/DECOMPRESSION  MICRODISCECTOMY 1 LEVEL;  Surgeon: Karn Cassis, MD;  Location: [facility];  Service: Neurosurgery;  Laterality: Right;  Right L45 diskectomy     OB History   None      Home Medications    Prior to Admission medications   Medication Sig Start Date End Date Taking? Authorizing Provider  acetaminophen (TYLENOL) 500 MG tablet Take 1,000 mg by mouth every 6 (six) hours as needed for mild pain or moderate pain.    [provider]  buPROPion (WELLBUTRIN XL) 150 MG 24 hr tablet Take 2 tablets (300 mg total) by mouth daily. 01/17/18   Plovsky, Earvin Hansen, MD  clonazePAM (KLONOPIN) 1 MG tablet 1 qam 2  qhs 01/17/18   Plovsky, Earvin Hansen, MD  DULoxetine (CYMBALTA) 60 MG capsule Take 2 capsules (120 mg total) by mouth daily. 01/17/18   Plovsky, Earvin Hansen, MD  lisinopril (PRINIVIL,ZESTRIL) 5 MG tablet Take 5 mg by mouth daily.    [provider]  metformin (FORTAMET) 500 MG (OSM) 24 hr tablet Take 1,500 mg by mouth daily with breakfast.    [provider]  omeprazole (PRILOSEC) 20 MG capsule Take 1 capsule (20 mg total) by mouth daily. 06/10/11   Mechele Dawley, NP  rosuvastatin (CRESTOR) 20 MG tablet Take 20 mg by mouth daily.      [provider]  topiramate (TOPAMAX) 50 MG tablet Take 1 tablet (50 mg total) by mouth 2 (two) times daily. 02/06/18   Nilda Riggs, NP  traZODone (DESYREL) 100 MG tablet 4  qhs 01/17/18   Plovsky, Earvin Hansen, MD  ziprasidone (GEODON) 80 MG capsule Take 1 capsule (80 mg total) by mouth 2 (two) times daily with a meal. 01/17/18   Archer Asa, MD    Family History Family History  Problem Relation Age of Onset  . Anxiety disorder Maternal Grandmother   . Depression Maternal Grandmother     Social History Social History   Tobacco Use  . Smoking status: Never Smoker  . Smokeless tobacco: Never Used  Substance Use Topics  . Alcohol use: No  . Drug use: No     Allergies   Codeine and Morphine and related   Review of  Systems Review of Systems  All other systems reviewed and are negative.    Physical Exam Updated Vital Signs BP 128/88 (BP Location: Left Arm)   Pulse (!) 115   Temp 98.3 F (36.8 C) (Oral)   Resp 18   SpO2 96%   Physical Exam  Constitutional: She is oriented to person, place, and time. She appears well-developed and well-nourished.  HENT:  Head: Normocephalic.  Eyes: EOM are normal.  Neck: Normal range of motion.  Pulmonary/Chest: Effort normal.  Abdominal: She exhibits no distension.  Musculoskeletal: Normal range of motion.  Neurological: She is alert and oriented to person, place, and time.  Psychiatric:  Suicidal ideation  Nursing note and vitals reviewed.    ED Treatments / Results  Labs (all labs ordered are listed, but only abnormal results are displayed) Labs Reviewed  I-STAT BETA HCG BLOOD, ED ([facility], [facility], [facility] ONLY)    EKG None  Radiology No results found.  Procedures Procedures (including critical care time)  Medications Ordered in ED Medications  acetaminophen (TYLENOL) tablet 650 mg (has no administration in time range)  zolpidem (AMBIEN) tablet 5 mg (has no administration in time range)  ondansetron (ZOFRAN) tablet 4 mg (has no administration in time range)  alum & mag hydroxide-simeth (MAALOX/MYLANTA) 200-200-20 MG/5ML suspension 30 mL (has no administration in time range)     Initial Impression / Assessment and Plan / ED Course  I have reviewed the triage vital signs and the nursing notes.  Pertinent labs & imaging results that were available during my care of the patient were reviewed by me and considered in my medical decision making (see chart for details).     Patient is medically clear at this time.  Sent to the emergency department by her psychiatrist for psychiatric inpatient management  Final Clinical Impressions(s) / ED Diagnoses   Final diagnoses:  None    ED Discharge Orders    None       Azalia Bilis, MD 05/17/18  1859

## 2018-05-17 NOTE — BH Assessment (Signed)
Tele Assessment Note   Patient Name: Jamie [name] MRN: [id number] Referring Physician: Dr. Azalia Bilis, MD Location of Patient: [name] Location of Provider: Behavioral Health TTS Department  Jamie [name] is a 47 y.o. female who arrived to [name] voluntarily with her husband at the recommendation of her psychiatrist due to ongoing SI she has been experiencing over the last month with an active plan to kill herself by overdosing on her prescription medication. Pt shares she's had SI off-and-on over the past 6 years--since her last hospitalization--though it's never been this prominent. Pt states she had 6 hospitalizations between 2012 and 2013 due to a difficult break-up with a partner. Pt states she is unable to contract for safety at this time. She denies HI, VH, and NSSIB. She states she has been experiencing AH for the last 6 months, though it's been especially bad the last month. She states she hears her own voice being especially critical of herself and telling her to kill herself. Pt states she had hear this voice before--3 years prior to being hospitalized--but that she hadn't heard it since being hospitalized until 6 months ago.  Pt denies access to weapons, any SA, or any involvement in the legal system.  Pt states her maternal grandmother suffered from anxiety and that her nephew has suffered with addiction from heroin and methamphetamine; she shares no family members have had SI. Pt denies any abuse inflicted onto her as a child or adult. She states her greatest support is her mother.  Pt states her appetite has been normal and that she has been able to sleep approximately 9 hours. She states her depression presents in the form of increased time in bed, being more quiet, more time to herself, an increase in guilt, worthlessness, and irritability. She shares she is able to independently able to complete her ADLs, though she does experience foot and leg pain.   Pt states she  has been seeing Merlene Pulling at [facility] since 2013. She states she has been seeing Dr. Archer Asa through [name] since 2013.  Pt is oriented x4. Her recent and remote memory is intact. Pt was cooperative throughout the assessment process. Pt's insight, judgement, and impulse control is impaired at this time.   Diagnosis: F33.3, Major depressive disorder, Recurrent episode, With psychotic features   Past Medical History:  Past Medical History:  Diagnosis Date  . Anxiety   . Bipolar 1 disorder (HCC)   . Complication of anesthesia   . Depression   . GERD (gastroesophageal reflux disease)   . H/O hiatal hernia   . Headache(784.0)   . High cholesterol   . Hypertension   . PCOS (polycystic ovarian syndrome)   . PONV (postoperative nausea and vomiting)    nausea  . Sleep apnea    CPAP    Past Surgical History:  Procedure Laterality Date  . ANAL FISSURE REPAIR     x 3  . BACK SURGERY  2009  . CHOLECYSTECTOMY  07/2002  . KNEE ARTHROSCOPY Right   . LUMBAR LAMINECTOMY/DECOMPRESSION MICRODISCECTOMY Right 01/22/2014   Procedure: LUMBAR FOUR TO FIVE LUMBAR LAMINECTOMY/DECOMPRESSION MICRODISCECTOMY 1 LEVEL;  Surgeon: Jamie Cassis, MD;  Location: [facility];  Service: Neurosurgery;  Laterality: Right;  Right L45 diskectomy    Family History:  Family History  Problem Relation Age of Onset  . Anxiety disorder Maternal Grandmother   . Depression Maternal Grandmother     Social History:  reports that she  has never smoked. She has never used smokeless tobacco. She reports that she does not drink alcohol or use drugs.  Additional Social History:  Alcohol / Drug Use Pain Medications: Please see MAR Prescriptions: Please see MAR Over the Counter: Please see MAR History of alcohol / drug use?: No history of alcohol / drug abuse Longest period of sobriety (when/how long): [name]  CIWA: CIWA-Ar BP: 91/77 Pulse Rate: (!) 104 COWS:     Allergies:  Allergies  Allergen Reactions  . Codeine Hives and Swelling  . Morphine And Related Swelling and Rash    7/2: Pt state is OK in small doses and is willing to take Morphine this admission    Home Medications:  (Not in a [name] admission)  OB/GYN Status:  No LMP recorded. (Menstrual status: Other).  General Assessment Data Location of Assessment: [facility] TTS Assessment: In system Is this a Tele or Face-to-Face Assessment?: Face-to-Face Is this an Initial Assessment or a Re-assessment for this encounter?: Initial Assessment Patient Accompanied by:: [name] Language Other than English: No Living Arrangements: Other (Comment)(Pt lives with her husband, mother, sister, & brother-in-law) What gender do you identify as?: Female Marital status: Married Artist name: Armed forces technical officer Pregnancy Status: No Living Arrangements: Spouse/significant other, Parent, Other relatives Can pt return to current living arrangement?: Yes Admission Status: Voluntary Is patient capable of signing voluntary admission?: Yes Referral Source: Self/Family/Friend Insurance type: [employer]     Crisis Care Plan Living Arrangements: Spouse/significant other, Parent, Other relatives Legal Guardian: Other:([name]) Name of Psychiatrist: Dr. Archer Asa - [facility] Name of Therapist: Merlene Pulling - [facility]  Education Status Is patient currently in school?: No Is the patient employed, unemployed or receiving disability?: Receiving disability income  Risk to self with the past 6 months Suicidal Ideation: Yes-Currently Present Has patient been a risk to self within the past 6 months prior to admission? : Yes Suicidal Intent: Yes-Currently Present Has patient had any suicidal intent within the past 6 months prior to admission? : Yes Is patient at risk for suicide?: Yes Suicidal Plan?: Yes-Currently Present Has patient had any suicidal plan within the  past 6 months prior to admission? : Yes Specify Current Suicidal Plan: Pt plans to overdose on her medications Access to Means: Yes Specify Access to Suicidal Means: Pt has access to medication What has been your use of drugs/alcohol within the last 12 months?: Pt denies Previous Attempts/Gestures: Yes How many times?: (Unknown) Other Self Harm Risks: None noted Triggers for Past Attempts: Other personal contacts, Unknown Intentional Self Injurious Behavior: None Family Suicide History: No Recent stressful life event(s): Loss (Comment)(Pt & husband lost their home last year) Persecutory voices/beliefs?: Yes Depression: Yes Depression Symptoms: Despondent, Insomnia, Isolating, Guilt, Feeling worthless/self pity, Feeling angry/irritable Substance abuse history and/or treatment for substance abuse?: No Suicide prevention information given to non-admitted patients: Not applicable  Risk to Others within the past 6 months Homicidal Ideation: No Does patient have any lifetime risk of violence toward others beyond the six months prior to admission? : No Thoughts of Harm to Others: No Current Homicidal Intent: No Current Homicidal Plan: No Access to Homicidal Means: No Identified Victim: None noted History of harm to others?: No Assessment of Violence: On admission Violent Behavior Description: None noted Does patient have access to weapons?: No(Pt denied) Criminal Charges Pending?: No Does patient have a court date: No Is patient on probation?: No  Psychosis Hallucinations: Auditory Delusions: None noted  Mental Status Report Appearance/Hygiene: In  scrubs Eye Contact: Good Motor Activity: Unremarkable(Pt sitting up in [name] bed) Speech: Logical/coherent Level of Consciousness: Alert Mood: Anxious, Empty Affect: Anxious, Appropriate to circumstance, Sullen Anxiety Level: Minimal Thought Processes: Coherent, Relevant Judgement: Impaired Orientation: Person, Place, Time,  Situation Obsessive Compulsive Thoughts/Behaviors: None  Cognitive Functioning Concentration: Normal Memory: Recent Intact, Remote Intact Is patient IDD: No Insight: Fair Impulse Control: Poor Appetite: Good Have you had any weight changes? : No Change Sleep: No Change Total Hours of Sleep: 9 Vegetative Symptoms: Staying in bed  ADLScreening [facility] Assessment Services) Patient's cognitive ability adequate to safely complete daily activities?: Yes Patient able to express need for assistance with ADLs?: Yes Independently performs ADLs?: Yes (appropriate for developmental age)  Prior Inpatient Therapy Prior Inpatient Therapy: Yes Prior Therapy Dates: 2012 - 2013 Prior Therapy Facilty[facility]Provider[facility] [place] [name] Reason for Treatment: SI  Prior Outpatient Therapy Prior Outpatient Therapy: Yes Prior Therapy Dates: 2012 -  Prior Therapy Facilty/Provider(s): [facility] Reason for Treatment: SI, depression Does patient have an ACCT team?: No Does patient have Intensive In-House Services?  : No Does patient have [facility] services? : No Does patient have P4CC services?: No  ADL Screening (condition at time of admission) Patient's cognitive ability adequate to safely complete daily activities?: Yes Is the patient deaf or have difficulty hearing?: No Does the patient have difficulty seeing, even when wearing glasses/contacts?: No Does the patient have difficulty concentrating, remembering, or making decisions?: No Patient able to express need for assistance with ADLs?: Yes Does the patient have difficulty dressing or bathing?: No Independently performs ADLs?: Yes (appropriate for developmental age) Does the patient have difficulty walking or climbing stairs?: No Weakness of Legs: Both Weakness of Arms/Hands: None     Therapy Consults (therapy consults require a physician order) PT Evaluation Needed: No OT Evalulation Needed: No SLP Evaluation Needed:  No Abuse/Neglect Assessment (Assessment to be complete while patient is alone) Abuse/Neglect Assessment Can Be Completed: Yes Physical Abuse: Denies Verbal Abuse: Denies Sexual Abuse: Denies Exploitation of patient/patient's resources: Denies Self-Neglect: Denies Values / Beliefs Cultural Requests During Hospitalization: None Spiritual Requests During Hospitalization: None Consults Spiritual Care Consult Needed: No Social Work Consult Needed: No Merchant navy officer (For Healthcare) Does Patient Have a Medical Advance Directive?: No Would patient like information on creating a medical advance directive?: No - Patient declined       Disposition: [name] PA reviewed pt's chart and information and determined pt meets inpatient hospitalization criteria. Pt has been accepted at [facility] into Room 508-1. This information was provided to pt's nurse at 2245. Disposition Initial Assessment Completed for this Encounter: Yes Patient referred to: Other (Comment)(Pt has been accepted at [facility] Room 784-6)  This service was provided via telemedicine using a 2-way, interactive audio and video technology.  Names of all persons participating in this telemedicine service and their role in this encounter. Name: Tekeyah Santiago Role: Patient  Name: Duard Brady Role: Clinician    Ralph Dowdy 05/17/2018 11:42 PM

## 2018-05-17 NOTE — ED Notes (Signed)
Pt transported to [facility] by [facility] transportation service for continuation of specialized care. Belongings given to driver after patient signed for them. Pt left in no acute distress. 

## 2018-05-17 NOTE — ED Notes (Signed)
Patient reports SI and depression. Patient denies HI/AVH at this time. Plan of care discussed. Encouragement and support provided and safety maintain. Q 15 min safety checks in place and video monitoring.

## 2018-05-17 NOTE — ED Notes (Signed)
Bed: [facility] Expected date:  Expected time:  Means of arrival:  Comments: 

## 2018-05-17 NOTE — ED Triage Notes (Addendum)
Patient reports SI "everyday." Denies plan. Reports taking psychiatric medications as prescribed. Denies HI. Does report hearing "negative voices." Cooperative in triage. Reports she was referred by psychiatrist after appointment today.

## 2018-05-17 NOTE — Progress Notes (Signed)
Patient ID: [name], female   DOB: 1970/11/23, 47 y.o.   MRN: [id number] [facility] MD Progress Note  05/17/2018 3:38 PM KELCE BOUTON  MRN:  [id number] Subjective:  Feeling well. Principal Problem: At this time this patient presents having persistent depression for the last 2 months.  Unfortunately for the last 1 month she experiencing persistent suicidal ideation.  Patient admits to persistent increasing depression and excessive sleep.  Her energy level is very low.  She is psychomotor slowed.  Patient has been psychiatrically hospitalized 6 times last one in 2013 for depression and suicidal ideation.  At this time the patient acknowledges the plan to end her life by taking pills.  She does not possess a gun.  The patient stresses seem to be related to financial issues.  Patient was seen with her husband [name].  While he acknowledges that she seems to be worse in the last month or so he had no idea that she was suicidal every day.  He was very alarmed and felt it was a sign that she was dangerous and needed to be hospitalized.  This patient is completely willing to come into the hospital at this time.  She describes anhedonia.  She takes her medicines just as prescribed.  This includes maximum doses of Wellbutrin 450 mg, both 120 mg, Klonopin 1 mg 1 in the morning and 2 at night, Geodon 80 mg twice daily.  The patient has been on this regime for well over a year.  At this time the patient can make no commitment to keep herself safe.  She is requesting together with her husband for psychiatric hospitalization.  Her husband has agreed to be certain that she will appear at the [facility] emergency room. Past Psychiatric History:   Past Medical History:  Diagnosis Date  . Anxiety   . Bipolar 1 disorder (HCC)   . Complication of anesthesia   . Depression   . GERD (gastroesophageal reflux disease)   . H/O hiatal hernia   . Headache(784.0)   . High cholesterol   . Hypertension   . PCOS  (polycystic ovarian syndrome)   . PONV (postoperative nausea and vomiting)    nausea  . Sleep apnea    CPAP    Past Surgical History:  Procedure Laterality Date  . ANAL FISSURE REPAIR     x 3  . BACK SURGERY  2009  . CHOLECYSTECTOMY  07/2002  . KNEE ARTHROSCOPY Right   . LUMBAR LAMINECTOMY/DECOMPRESSION MICRODISCECTOMY Right 01/22/2014   Procedure: LUMBAR FOUR TO FIVE LUMBAR LAMINECTOMY/DECOMPRESSION MICRODISCECTOMY 1 LEVEL;  Surgeon: Karn Cassis, MD;  Location: [facility];  Service: Neurosurgery;  Laterality: Right;  Right L45 diskectomy   Family History:  Family History  Problem Relation Age of Onset  . Anxiety disorder Maternal Grandmother   . Depression Maternal Grandmother    Family Psychiatric  History:  Social History:  Social History   Substance and Sexual Activity  Alcohol Use No     Social History   Substance and Sexual Activity  Drug Use No    Social History   Socioeconomic History  . Marital status: Married    Spouse name: Not on file  . Number of children: 0  . Years of education: college  . Highest education level: Not on file  Occupational History  . Occupation: disabled  Social Needs  . Financial resource strain: Not on file  . Food insecurity:    Worry: Not on file  Inability: Not on file  . Transportation needs:    Medical: Not on file    Non-medical: Not on file  Tobacco Use  . Smoking status: Never Smoker  . Smokeless tobacco: Never Used  Substance and Sexual Activity  . Alcohol use: No  . Drug use: No  . Sexual activity: Yes    Partners: Male    Birth control/protection: None  Lifestyle  . Physical activity:    Days per week: Not on file    Minutes per session: Not on file  . Stress: Not on file  Relationships  . Social connections:    Talks on phone: Not on file    Gets together: Not on file    Attends religious service: Not on file    Active member of club or organization: Not on file    Attends meetings of clubs  or organizations: Not on file    Relationship status: Not on file  Other Topics Concern  . Not on file  Social History Narrative   Patient lives at home alone    Patient is right handed   Patient drinks soda's daily   Additional Social History:                         Sleep: Fair  Appetite:  Good  Current Medications: Current Outpatient Medications  Medication Sig Dispense Refill  . acetaminophen (TYLENOL) 500 MG tablet Take 1,000 mg by mouth every 6 (six) hours as needed for mild pain or moderate pain.    [name] Kitchen buPROPion (WELLBUTRIN XL) 150 MG 24 hr tablet Take 2 tablets (300 mg total) by mouth daily. 90 tablet 5  . clonazePAM (KLONOPIN) 1 MG tablet 1 qam 2  qhs 90 tablet 5  . DULoxetine (CYMBALTA) 60 MG capsule Take 2 capsules (120 mg total) by mouth daily. 180 capsule 7  . lisinopril (PRINIVIL,ZESTRIL) 5 MG tablet Take 5 mg by mouth daily.    . metformin (FORTAMET) 500 MG (OSM) 24 hr tablet Take 1,500 mg by mouth daily with breakfast.    . omeprazole (PRILOSEC) 20 MG capsule Take 1 capsule (20 mg total) by mouth daily. 30 capsule 0  . rosuvastatin (CRESTOR) 20 MG tablet Take 20 mg by mouth daily.      [name] Kitchen topiramate (TOPAMAX) 50 MG tablet Take 1 tablet (50 mg total) by mouth 2 (two) times daily. 180 tablet 3  . traZODone (DESYREL) 100 MG tablet 4 qhs 120 tablet 8  . ziprasidone (GEODON) 80 MG capsule Take 1 capsule (80 mg total) by mouth 2 (two) times daily with a meal. 60 capsule 8   No current facility-administered medications for this visit.     Lab Results: No results found for this or any previous visit (from the past 48 hour(s)).  Physical Findings: AIMS:  , ,  ,  ,    CIWA:    COWS:     Musculoskeletal: Strength & Muscle Tone: within normal limits Gait & Station: normal Patient leans: Right  Psychiatric Specialty Exam: ROS  Blood pressure 122/78, pulse 93, height 5\' 3"  (1.6 m), weight 248 lb (112.5 kg), SpO2 96 %.Body mass index is 43.93 kg/m.  General  Appearance: Casual  Eye Contact::  Good  Speech:  Clear and Coherent  Volume:  Normal  Mood:Depressed  Affect:  Appropriate  Today the patient is doing very well. Thought Process:  Coherent  Orientation:  Full (Time, Place, and Person)  Thought Content:  NA and WDL  Suicidal Thoughts:  yes  Homicidal Thoughts:  No  Memory:  Immediate;   NA  Judgement:  Good  Insight:  Good  Psychomotor Activity:  NA  Concentration:  Good  Recall:  Good  Fund of Knowledge:Good  Language: Good  Akathisia:  No  Handed:  Right  AIMS (if indicated):     Assets:  Desire for Improvement  ADL's:  Intact  Cognition: WNL  Sleep:      Treatment Plan Summary: 05/17/2018, 3:38 PM  At this time it is evident the patient is acutely suicidal.  While she is never made an attempt in the past she been psychiatrically hospitalized multiple times for depression and suicidal ideation.  Today the patient agreed to come into the hospital and her husband will assist her in this process.  We have contacted [facility] room and shared with him the report for this patient will be appearing in the emergency room in the next 1 to 2 hours.  Her husband would like to take her home to get some clothing and take care of some other things.  There is no gun at home and the patient is completely willing to come into the hospital.  The husband has assured [place] that he will bring her in together with the patient's mother who will come with him.  This patient clearly has major clinical depression and takes multiple psychiatric medications.  The husband has our number to call if there are any problems.  At this time she is on high dose of medications and there is a need to adjust these medications and to keep this patient safe.  It is noted that the husband does not drive and requires the patient with the husband to go home first to get the patient's mother to bring them both back to the hospital.  The patient seems very committed to  getting treatment.

## 2018-05-17 NOTE — ED Notes (Signed)
Patient changed into paper scrubs and wanded by security. 

## 2018-05-17 NOTE — ED Notes (Signed)
One labeled patient belongings bag transferred with patient. 

## 2018-05-18 ENCOUNTER — Other Ambulatory Visit: Payer: Self-pay

## 2018-05-18 ENCOUNTER — Other Ambulatory Visit: Payer: Self-pay | Admitting: Registered Nurse

## 2018-05-18 ENCOUNTER — Encounter (HOSPITAL_COMMUNITY): Payer: Self-pay

## 2018-05-18 DIAGNOSIS — F419 Anxiety disorder, unspecified: Secondary | ICD-10-CM

## 2018-05-18 DIAGNOSIS — R45851 Suicidal ideations: Secondary | ICD-10-CM

## 2018-05-18 DIAGNOSIS — G4700 Insomnia, unspecified: Secondary | ICD-10-CM

## 2018-05-18 DIAGNOSIS — F315 Bipolar disorder, current episode depressed, severe, with psychotic features: Principal | ICD-10-CM

## 2018-05-18 MED ORDER — ZIPRASIDONE MESYLATE 20 MG IM SOLR: 20.0000 mg | Freq: Two times a day (BID) | INTRAMUSCULAR | Status: DC | PRN

## 2018-05-18 MED ORDER — ALUM & MAG HYDROXIDE-SIMETH 200-200-20 MG/5ML PO SUSP: 30.0000 mL | ORAL | Status: DC | PRN

## 2018-05-18 MED ORDER — ACETAMINOPHEN 325 MG PO TABS
650.0000 mg | ORAL_TABLET | Freq: Four times a day (QID) | ORAL | Status: DC | PRN
  Administered 2018-05-19 – 2018-05-20 (×2): 650 mg via ORAL
  Filled 2018-05-18: qty 2

## 2018-05-18 MED ORDER — BUPROPION HCL ER (XL) 300 MG PO TB24
300.0000 mg | ORAL_TABLET | Freq: Every day | ORAL | Status: DC
  Administered 2018-05-18 – 2018-05-22 (×5): 300 mg via ORAL
  Filled 2018-05-18 (×9): qty 1

## 2018-05-18 MED ORDER — LORAZEPAM 2 MG/ML IJ SOLN: 2.0000 mg | Freq: Four times a day (QID) | INTRAMUSCULAR | Status: DC | PRN

## 2018-05-18 MED ORDER — TOPIRAMATE 25 MG PO TABS
50.0000 mg | ORAL_TABLET | Freq: Two times a day (BID) | ORAL | Status: DC
  Administered 2018-05-18 – 2018-05-22 (×9): 50 mg via ORAL
  Filled 2018-05-18 (×14): qty 2

## 2018-05-18 MED ORDER — OLANZAPINE 5 MG PO TBDP: 5.0000 mg | ORAL_TABLET | Freq: Three times a day (TID) | ORAL | Status: DC | PRN

## 2018-05-18 MED ORDER — CLONAZEPAM 1 MG PO TABS
2.0000 mg | ORAL_TABLET | Freq: Every day | ORAL | Status: DC
  Administered 2018-05-18 – 2018-05-21 (×4): 2 mg via ORAL
  Filled 2018-05-18 (×4): qty 2

## 2018-05-18 MED ORDER — BENZTROPINE MESYLATE 1 MG/ML IJ SOLN: 1.0000 mg | Freq: Two times a day (BID) | INTRAMUSCULAR | Status: DC | PRN

## 2018-05-18 MED ORDER — TRAZODONE HCL 50 MG PO TABS
50.0000 mg | ORAL_TABLET | Freq: Every evening | ORAL | Status: DC | PRN
  Filled 2018-05-18 (×4): qty 1

## 2018-05-18 MED ORDER — ARIPIPRAZOLE 10 MG PO TABS
10.0000 mg | ORAL_TABLET | Freq: Every day | ORAL | Status: DC
  Administered 2018-05-18 – 2018-05-22 (×5): 10 mg via ORAL
  Filled 2018-05-18 (×8): qty 1

## 2018-05-18 MED ORDER — LORAZEPAM 1 MG PO TABS: 2.0000 mg | ORAL_TABLET | Freq: Four times a day (QID) | ORAL | Status: DC | PRN

## 2018-05-18 MED ORDER — HYDROXYZINE HCL 25 MG PO TABS
25.0000 mg | ORAL_TABLET | Freq: Four times a day (QID) | ORAL | Status: DC | PRN
  Administered 2018-05-18: 25 mg via ORAL
  Filled 2018-05-18: qty 1

## 2018-05-18 MED ORDER — HYDROXYZINE HCL 50 MG PO TABS: 50.0000 mg | ORAL_TABLET | Freq: Four times a day (QID) | ORAL | Status: DC | PRN

## 2018-05-18 MED ORDER — CLONAZEPAM 1 MG PO TABS
1.0000 mg | ORAL_TABLET | ORAL | Status: DC
  Administered 2018-05-19 – 2018-05-22 (×4): 1 mg via ORAL
  Filled 2018-05-18 (×4): qty 1

## 2018-05-18 MED ORDER — LISINOPRIL 5 MG PO TABS
5.0000 mg | ORAL_TABLET | Freq: Every day | ORAL | Status: DC
  Administered 2018-05-18 – 2018-05-22 (×5): 5 mg via ORAL
  Filled 2018-05-18 (×8): qty 1

## 2018-05-18 MED ORDER — METFORMIN HCL 500 MG PO TABS
1500.0000 mg | ORAL_TABLET | Freq: Every day | ORAL | Status: DC
  Administered 2018-05-18 – 2018-05-22 (×5): 1500 mg via ORAL
  Filled 2018-05-18 (×8): qty 3

## 2018-05-18 MED ORDER — TRAZODONE HCL 150 MG PO TABS
400.0000 mg | ORAL_TABLET | Freq: Every day | ORAL | Status: DC
  Administered 2018-05-18 – 2018-05-21 (×4): 400 mg via ORAL
  Filled 2018-05-18 (×6): qty 1

## 2018-05-18 MED ORDER — MAGNESIUM HYDROXIDE 400 MG/5ML PO SUSP: 30.0000 mL | Freq: Every day | ORAL | Status: DC | PRN

## 2018-05-18 MED ORDER — DULOXETINE HCL 60 MG PO CPEP
120.0000 mg | ORAL_CAPSULE | Freq: Every day | ORAL | Status: DC
  Administered 2018-05-18: 120 mg via ORAL
  Filled 2018-05-18 (×4): qty 2

## 2018-05-18 MED ORDER — BENZTROPINE MESYLATE 1 MG PO TABS: 1.0000 mg | ORAL_TABLET | Freq: Two times a day (BID) | ORAL | Status: DC | PRN

## 2018-05-18 MED ORDER — DULOXETINE HCL 60 MG PO CPEP
120.0000 mg | ORAL_CAPSULE | Freq: Every day | ORAL | Status: DC
  Administered 2018-05-19 – 2018-05-21 (×3): 120 mg via ORAL
  Filled 2018-05-18 (×5): qty 2

## 2018-05-18 NOTE — Progress Notes (Signed)
Adult Psychoeducational Group Note  Date:  05/18/2018 Time:  9:42 PM  Group Topic/Focus:  Wrap-Up Group:   The focus of this group is to help patients review their daily goal of treatment and discuss progress on daily workbooks.  Participation Level:  Active  Participation Quality:  Appropriate  Affect:  Appropriate  Cognitive:  Alert  Insight: Appropriate  Engagement in Group:  Engaged  Modes of Intervention:  Discussion  Additional Comments:  Patient stated having an okay day. Patient stated she did not get any sleep last night. Patient's goal for today was to stop having suicidal thoughts. Patient did not meet her goal, but writer suggested her to come up with coping skills to distract her mind off the suicidal thoughts.  Sohan Potvin L Seymone Forlenza 05/18/2018, 9:42 PM

## 2018-05-18 NOTE — Tx Team (Signed)
Initial Treatment Plan 05/18/2018 1:50 AM [name] ZOX:[id number]    PATIENT STRESSORS: Financial difficulties Health problems Marital or family conflict   PATIENT STRENGTHS: Ability for insight Communication skills Motivation for treatment/growth Supportive family/friends   PATIENT IDENTIFIED PROBLEMS: "suicidal thoughts"  "anger"                   DISCHARGE CRITERIA:  Improved stabilization in mood, thinking, and/or behavior Need for constant or close observation no longer present Reduction of life-threatening or endangering symptoms to within safe limits  PRELIMINARY DISCHARGE PLAN: Return to previous living arrangement  PATIENT/FAMILY INVOLVEMENT: This treatment plan has been presented to and reviewed with the patient, Jamie [name]. The patient and family have been given the opportunity to ask questions and make suggestions.  Edwyna Perfect, RN 05/18/2018, 1:50 AM

## 2018-05-18 NOTE — [facility] Suicide Risk Assessment (Signed)
[facility] Admission Suicide Risk Assessment   Nursing information obtained from:  Patient Demographic factors:  Low socioeconomic status, Unemployed, Caucasian Current Mental Status:  Suicidal ideation indicated by patient, Suicide plan, Plan includes specific time, place, or method Loss Factors:  Financial problems / change in socioeconomic status Historical Factors:  NA Risk Reduction Factors:  Living with another person, especially a relative  Total Time spent with patient: 1 hour Principal Problem: Bipolar I disorder, severe, current or most recent episode depressed, with psychotic features (HCC) Diagnosis:   Patient Active Problem List   Diagnosis Date Noted  . Common migraine [G43.009] 11/03/2016  . OSA on CPAP [G47.33, Z99.89] 11/03/2016  . Lumbar herniated disc [M51.26] 01/22/2014  . Headache [R51] 12/06/2012  . Bipolar I disorder, severe, current or most recent episode depressed, with psychotic features (HCC) [F31.5] 11/15/2012  . Severe major depression with psychotic features, mood-congruent (HCC) [F32.3] 05/03/2012  . Generalized anxiety disorder [F41.1] 06/09/2011   Subjective Data: see H&P  Continued Clinical Symptoms:  Alcohol Use Disorder Identification Test Final Score (AUDIT): 0 The "Alcohol Use Disorders Identification Test", Guidelines for Use in Primary Care, Second Edition.  World Science writer [facility]). Score between 0-7:  no or low risk or alcohol related problems. Score between 8-15:  moderate risk of alcohol related problems. Score between 16-19:  high risk of alcohol related problems. Score 20 or above:  warrants further diagnostic evaluation for alcohol dependence and treatment.  Psychiatric Specialty Exam: Physical Exam  Nursing note and vitals reviewed.     Blood pressure (!) 139/96, pulse (!) 108, temperature 98.4 F (36.9 C), temperature source Oral, resp. rate 18, height 5\' 3"  (1.6 m), weight 112.5 kg.Body mass index is 43.93 kg/m.      COGNITIVE FEATURES THAT CONTRIBUTE TO RISK:  None    SUICIDE RISK:   Severe:  Frequent, intense, and enduring suicidal ideation, specific plan, no subjective intent, but some objective markers of intent (i.e., choice of lethal method), the method is accessible, some limited preparatory behavior, evidence of impaired self-control, severe dysphoria/symptomatology, multiple risk factors present, and few if any protective factors, particularly a lack of social support.  PLAN OF CARE: see H&P  I certify that inpatient services furnished can reasonably be expected to improve the patient's condition.   Micheal Likens, MD 05/18/2018, 5:40 PM
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
## 2018-05-18 NOTE — Progress Notes (Signed)
Recreation Therapy Notes  Date: 10.24.19 Time: 1000 Location: 500 Hall Dayroom  Group Topic: Leisure Education  Goal Area(s) Addresses:  Patient will identify positive leisure activities.  Patient will identify one positive benefit of participation in leisure activities.   Behavioral Response: None  Intervention: Magazines, scissors, Holiday representative paper, glue sticks  Activity: Leisure Collage.  Patients were to create a leisure collage of activities they can do to relax or to engage with family/friends.  Education:  Leisure Education, Building control surveyor  Education Outcome: Acknowledges education/In group clarification offered/Needs additional education  Clinical Observations/Feedback: Pt sat in group but did not participate.    Caroll Rancher, LRT/CTRS         Caroll Rancher A 05/18/2018 12:10 PM

## 2018-05-18 NOTE — Progress Notes (Signed)
D: Pt passive SI/ AH- contracts for safety,   Pt is pleasant and cooperative. Pt visible in the dayroom watching TV with peers this evening, pt seen with visitors for a little while this evening. Pt presents with flat affect  A: Pt was offered support and encouragement. Pt was given scheduled medications. Pt was encourage to attend groups. Q 15 minute checks were done for safety.   R:Pt attends groups and interacts well with peers and staff. Pt is taking medication. Pt has no complaints.Pt receptive to treatment and safety maintained on unit.   Problem: Education: Goal: Emotional status will improve Outcome: Progressing   Problem: Education: Goal: Mental status will improve Outcome: Progressing   Problem: Activity: Goal: Interest or engagement in activities will improve Outcome: Progressing

## 2018-05-18 NOTE — [facility] Suicide Risk Assessment (Signed)
[facility] INPATIENT:  Family/Significant Other Suicide Prevention Education  Suicide Prevention Education:  Education Completed; Jamie [name], Jamie [name] [phone],  (name of family member/significant other) has been identified by the patient as the family member/significant other with whom the patient will be residing, and identified as the person(s) who will aid the patient in the event of a mental health crisis (suicidal ideations/suicide attempt).  With written consent from the patient, the family member/significant other has been provided the following suicide prevention education, prior to the and/or following the discharge of the patient.  The suicide prevention education provided includes the following:  Suicide risk factors  Suicide prevention and interventions  National Suicide Hotline telephone number  [facility] assessment telephone number  [facility] Emergency Assistance 911  [facility] and/or Residential Mobile Crisis Unit telephone number  Request made of family/significant other to:  Remove weapons (e.g., guns, rifles, knives), all items previously/currently identified as safety concern.  [name] stated there is an antique gun in the home that does not work.   Remove drugs/medications (over-the-counter, prescriptions, illicit drugs), all items previously/currently identified as a safety concern.  The family member/significant other verbalizes understanding of the suicide prevention education information provided.  The family member/significant other agrees to remove the items of safety concern listed above.  Cherie Bohaboy 05/18/2018, 4:31 PM
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
## 2018-05-18 NOTE — Progress Notes (Signed)
Recreation Therapy Notes  INPATIENT RECREATION THERAPY ASSESSMENT  Patient Details Name: CALAYAH GUADARRAMA MRN: [id number] DOB: [birth year] Today's Date: 05/18/2018       Information Obtained From: Patient  Able to Participate in Assessment/Interview: Yes  Patient Presentation: Alert  Reason for Admission (Per Patient): Suicidal Ideation  Patient Stressors: Other (Comment)(Living arrangement; Bills)  Coping Skills:   Isolation, Journal, TV, Music, Exercise, Meditate, Deep Breathing, Talk, Prayer, Avoidance, Read, Hot Bath/Shower, Art  Leisure Interests (2+):  Individual - Reading, Individual - TV, Individual - Other (Comment), Crafts - Quilting(Play with animals)  Frequency of Recreation/Participation: Other (Comment)(Daily)  Awareness of Community Resources:  Yes  Community Resources:  Library, [employer], Other (Comment)([employer] golf)  Current Use: Yes  If no, Barriers?:    Expressed Interest in [employer] Information: No  [place] of Residence:  [place]  Patient Main Form of Transportation: Car  Patient Strengths:  Compassion; Faith  Patient Identified Areas of Improvement:  Anger Management; Tolerence of others  Patient Goal for Hospitalization:  "Quit having suicidal thoughts"  Current SI (including self-harm):  No  Current HI:  No  Current AVH: No  Staff Intervention Plan: Group Attendance, Collaborate with Interdisciplinary Treatment Team  Consent to Intern Participation: [name]    Caroll Rancher, LRT/CTRS  Caroll Rancher A 05/18/2018, 12:30 PM

## 2018-05-18 NOTE — [facility] Group Notes (Signed)
[facility] LCSW Group Therapy Note  Date/Time: 05/18/18, 1315  Type of Therapy/Topic:  Group Therapy:  Balance in Life  Participation Level:  minimal  Description of Group:    This group will address the concept of balance and how it feels and looks when one is unbalanced. Patients will be encouraged to process areas in their lives that are out of balance, and identify reasons for remaining unbalanced. Facilitators will guide patients utilizing problem- solving interventions to address and correct the stressor making their life unbalanced. Understanding and applying boundaries will be explored and addressed for obtaining  and maintaining a balanced life. Patients will be encouraged to explore ways to assertively make their unbalanced needs known to significant others in their lives, using other group members and facilitator for support and feedback.  Therapeutic Goals: 1. Patient will identify two or more emotions or situations they have that consume much of in their lives. 2. Patient will identify signs/triggers that life has become out of balance:  3. Patient will identify two ways to set boundaries in order to achieve balance in their lives:  4. Patient will demonstrate ability to communicate their needs through discussion and/or role plays  Summary of Patient Progress:Pt was attentive during group but did not participate in the discussion.  Pt answered CSW questions when asked and said that mental/emotional and financial are areas of her life that are currently out of balance.           Therapeutic Modalities:   Cognitive Behavioral Therapy Solution-Focused Therapy Assertiveness Training  Daleen Squ[place], [place]

## 2018-05-18 NOTE — Plan of Care (Signed)
Problem: Safety: Goal: Ability to disclose and discuss suicidal ideas will improve Outcome: Progressing   Problem: Coping: Goal: Coping ability will improve Outcome: Progressing   Problem: Medication: Goal: Compliance with prescribed medication regimen will improve Outcome: Progressing DAR Note: Pt visible in dayroom majority of this shift. Denies SI, HI, AVh and pain when assessed. Cooperative with care and unit routines. Reports poor sleep, good appetite, low energy and good concentration. Rates her depression 5/10,  hopelessness 2/10 and anxiety 6/10. Pt's goal this shift "try and stop suicidal thoughts". Scheduled medications administered as ordered with verbal education and effects monitored. Encouraged pt to voice concerns, attend to ADLs and comply with current treatment regimen. EKG done and placed in chart. Q 15 minutes safety checks maintained without self harm gestures / outburst. Pt receptive to care. Cooperative with unit groups. Tolerates all PO intake well. POC continues for safety and mood stability.
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
## 2018-05-19 DIAGNOSIS — R451 Restlessness and agitation: Secondary | ICD-10-CM

## 2018-05-19 LAB — HEMOGLOBIN A1C
HEMOGLOBIN A1C: 6.2 % — AB (ref 4.8–5.6)
Mean Plasma Glucose: 131.24 mg/dL

## 2018-05-19 LAB — LIPID PANEL
Cholesterol: 158 mg/dL (ref 0–200)
HDL: 38 mg/dL — AB (ref >40)
LDL CALC: UNDETERMINED mg/dL (ref 0–99)
TRIGLYCERIDES: 448 mg/dL — AB (ref <150)
Total CHOL/HDL Ratio: 4.2 RATIO
VLDL: UNDETERMINED mg/dL (ref 0–40)

## 2018-05-19 LAB — TSH: TSH: 4.08 u[IU]/mL (ref 0.350–4.500)

## 2018-05-19 NOTE — Progress Notes (Signed)
DAR NOTE: Pt present with calmt affect and depressed mood in the unit. Pt has been in the bed most of the day. Pt complained of headache, took all her meds as scheduled. As per self inventory, pt had a good night sleep, good appetite, normal energy, and good concentration. Pt rate depression at 5, hopeless ness at 2, and anxiety at 2. Pt's safety ensured with 15 minute and environmental checks. Pt currently denies SI/HI and A/V hallucinations. Pt verbally agrees to seek staff if SI/HI or A/VH occurs and to consult with staff before acting on these thoughts. Will continue POC.

## 2018-05-19 NOTE — Progress Notes (Signed)
Adult Psychoeducational Group Note  Date:  05/19/2018 Time:  11:27 PM  Group Topic/Focus:  Wrap-Up Group:   The focus of this group is to help patients review their daily goal of treatment and discuss progress on daily workbooks.  Participation Level:  Active  Participation Quality:  Appropriate  Affect:  Appropriate  Cognitive:  Appropriate  Insight: Appropriate  Engagement in Group:  Engaged  Modes of Intervention:  Discussion  Additional Comments:  Patient attended group and said that her day was a 7.  Her coping skills were sledping and talking to husband.   Wassim Kirksey W Danielly Ackerley 05/19/2018, 11:27 PM
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
## 2018-05-19 NOTE — Progress Notes (Signed)
Recreation Therapy Notes  Date: 10.25.19 Time: 1000 Location: 500 Hall Dayroom  Group Topic: Communication, Team Building, Problem Solving  Goal Area(s) Addresses:  Patient will effectively work with peer towards shared goal.  Patient will identify skill used to make activity successful.  Patient will identify how skills used during activity can be used to reach post d/c goals.   Intervention: STEM Activity   Activity: In team's, using 10 red plastic cups, patients were asked to build a pyramid.    Education: Pharmacist, community, Building control surveyor.   Education Outcome: Acknowledges education/In group clarification offered/Needs additional education.   Clinical Observations/Feedback: Pt did not attend group.    Caroll Rancher, LRT/CTRS         Caroll Rancher A 05/19/2018 11:33 AM

## 2018-05-19 NOTE — Progress Notes (Signed)
D: Pt denies SI/HI/AVH. Pt is pleasant and cooperative. Pt visible on the unit this evening in the dayroom and on the phone, pt stated she was feeling better since switching from the Geodon, pt stated the Abilify seems to be helping.   A: Pt was offered support and encouragement. Pt was given scheduled medications. Pt was encourage to attend groups. Q 15 minute checks were done for safety.   R:Pt attends groups and interacts well with peers and staff. Pt is taking medication. Pt has no complaints.Pt receptive to treatment and safety maintained on unit.   Problem: Education: Goal: Mental status will improve Outcome: Progressing   Problem: Activity: Goal: Sleeping patterns will improve Outcome: Progressing   Problem: Health Behavior/Discharge Planning: Goal: Compliance with treatment plan for underlying cause of condition will improve Outcome: Progressing   Problem: Safety: Goal: Periods of time without injury will increase Outcome: Progressing   Problem: Activity: Goal: Interest or engagement in leisure activities will improve Outcome: Progressing

## 2018-05-19 NOTE — Tx Team (Signed)
Interdisciplinary Treatment and Diagnostic Plan Update  05/19/2018 Time of Session: 0949 Jamie [name] MRN: [id number]  Principal Diagnosis: Bipolar I disorder, severe, current or most recent episode depressed, with psychotic features (HCC)  Secondary Diagnoses: Principal Problem:   Bipolar I disorder, severe, current or most recent episode depressed, with psychotic features (HCC)   Current Medications:  Current Facility-Administered Medications  Medication Dose Route Frequency Provider Last Rate Last Dose  . acetaminophen (TYLENOL) tablet 650 mg  650 mg Oral Q6H PRN Kerry Hough, PA-C      . alum & mag hydroxide-simeth (MAALOX/MYLANTA) 200-200-20 MG/5ML suspension 30 mL  30 mL Oral Q4H PRN [name] E, PA-C      . ARIPiprazole (ABILIFY) tablet 10 mg  10 mg Oral Daily Micheal Likens, MD   10 mg at 05/19/18 0819  . benztropine (COGENTIN) tablet 1 mg  1 mg Oral Q12H PRN Micheal Likens, MD       Or  . benztropine mesylate (COGENTIN) injection 1 mg  1 mg Intramuscular Q12H PRN Micheal Likens, MD      . buPROPion (WELLBUTRIN XL) 24 hr tablet 300 mg  300 mg Oral Daily Micheal Likens, MD   300 mg at 05/19/18 0818  . clonazePAM (KLONOPIN) tablet 1 mg  1 mg Oral Veatrice Kells, MD   1 mg at 05/19/18 0631   And  . clonazePAM (KLONOPIN) tablet 2 mg  2 mg Oral QHS Micheal Likens, MD   2 mg at 05/18/18 2100  . DULoxetine (CYMBALTA) DR capsule 120 mg  120 mg Oral QHS Jolyne Loa T, MD      . hydrOXYzine (ATARAX/VISTARIL) tablet 50 mg  50 mg Oral Q6H PRN Micheal Likens, MD      . lisinopril (PRINIVIL,ZESTRIL) tablet 5 mg  5 mg Oral Daily Micheal Likens, MD   5 mg at 05/19/18 0818  . LORazepam (ATIVAN) tablet 2 mg  2 mg Oral Q6H PRN Micheal Likens, MD       Or  . LORazepam (ATIVAN) injection 2 mg  2 mg Intramuscular Q6H PRN Micheal Likens, MD      . magnesium hydroxide  (MILK OF MAGNESIA) suspension 30 mL  30 mL Oral Daily PRN [name] E, PA-C      . metFORMIN (GLUCOPHAGE) tablet 1,500 mg  1,500 mg Oral Q breakfast Micheal Likens, MD   1,500 mg at 05/19/18 0818  . OLANZapine zydis (ZYPREXA) disintegrating tablet 5 mg  5 mg Oral Q8H PRN Micheal Likens, MD       Or  . ziprasidone (GEODON) injection 20 mg  20 mg Intramuscular Q12H PRN Micheal Likens, MD      . topiramate (TOPAMAX) tablet 50 mg  50 mg Oral BID Micheal Likens, MD   50 mg at 05/19/18 0818  . traZODone (DESYREL) tablet 400 mg  400 mg Oral QHS Micheal Likens, MD   400 mg at 05/18/18 2059   PTA Medications: Medications Prior to Admission  Medication Sig Dispense Refill Last Dose  . acetaminophen (TYLENOL) 500 MG tablet Take 1,000 mg by mouth every 6 (six) hours as needed for mild pain or moderate pain.   unknown  . buPROPion (WELLBUTRIN XL) 150 MG 24 hr tablet Take 2 tablets (300 mg total) by mouth daily. 90 tablet 5 05/17/2018 at Unknown time  . clonazePAM (KLONOPIN) 1 MG tablet 1 qam 2  qhs 90 tablet 5 05/17/2018  at Unknown time  . DULoxetine (CYMBALTA) 60 MG capsule Take 2 capsules (120 mg total) by mouth daily. 180 capsule 7 05/16/2018 at Unknown time  . fluticasone (FLONASE) 50 MCG/ACT nasal spray Place 1 spray into both nostrils daily as needed for allergies or rhinitis.   05/16/2018 at Unknown time  . lisinopril (PRINIVIL,ZESTRIL) 5 MG tablet Take 5 mg by mouth daily.   05/17/2018 at Unknown time  . metformin (FORTAMET) 500 MG (OSM) 24 hr tablet Take 1,500 mg by mouth daily with breakfast.   05/17/2018 at Unknown time  . omeprazole (PRILOSEC) 20 MG capsule Take 1 capsule (20 mg total) by mouth daily. 30 capsule 0 05/17/2018 at Unknown time  . rosuvastatin (CRESTOR) 20 MG tablet Take 20 mg by mouth daily.     05/17/2018 at Unknown time  . topiramate (TOPAMAX) 50 MG tablet Take 1 tablet (50 mg total) by mouth 2 (two) times daily. 180 tablet 3  05/17/2018 at  0900  . traZODone (DESYREL) 100 MG tablet 4 qhs (Patient taking differently: Take 400 mg by mouth at bedtime. 4 qhs) 120 tablet 8 05/16/2018 at Unknown time  . ziprasidone (GEODON) 80 MG capsule Take 1 capsule (80 mg total) by mouth 2 (two) times daily with a meal. 60 capsule 8 05/17/2018 at Unknown time    Patient Stressors: Financial difficulties Health problems Marital or family conflict  Patient Strengths: Ability for insight Barrister's clerk for treatment/growth Supportive family/friends  Treatment Modalities: Medication Management, Group therapy, Case management,  1 to 1 session with clinician, Psychoeducation, Recreational therapy.   Physician Treatment Plan for Primary Diagnosis: Bipolar I disorder, severe, current or most recent episode depressed, with psychotic features (HCC) Long Term Goal(s): Improvement in symptoms so as ready for discharge Improvement in symptoms so as ready for discharge   Short Term Goals: Ability to identify and develop effective coping behaviors will improve Ability to demonstrate self-control will improve  Medication Management: Evaluate patient's response, side effects, and tolerance of medication regimen.  Therapeutic Interventions: 1 to 1 sessions, Unit Group sessions and Medication administration.  Evaluation of Outcomes: Progressing  Physician Treatment Plan for Secondary Diagnosis: Principal Problem:   Bipolar I disorder, severe, current or most recent episode depressed, with psychotic features (HCC)  Long Term Goal(s): Improvement in symptoms so as ready for discharge Improvement in symptoms so as ready for discharge   Short Term Goals: Ability to identify and develop effective coping behaviors will improve Ability to demonstrate self-control will improve     Medication Management: Evaluate patient's response, side effects, and tolerance of medication regimen.  Therapeutic Interventions: 1 to 1  sessions, Unit Group sessions and Medication administration.  Evaluation of Outcomes: Progressing   RN Treatment Plan for Primary Diagnosis: Bipolar I disorder, severe, current or most recent episode depressed, with psychotic features (HCC) Long Term Goal(s): Knowledge of disease and therapeutic regimen to maintain health will improve  Short Term Goals: Ability to identify and develop effective coping behaviors will improve and Compliance with prescribed medications will improve  Medication Management: RN will administer medications as ordered by provider, will assess and evaluate patient's response and provide education to patient for prescribed medication. RN will report any adverse and/or side effects to prescribing provider.  Therapeutic Interventions: 1 on 1 counseling sessions, Psychoeducation, Medication administration, Evaluate responses to treatment, Monitor vital signs and CBGs as ordered, Perform/monitor CIWA, COWS, AIMS and Fall Risk screenings as ordered, Perform wound care treatments as ordered.  Evaluation of Outcomes: Progressing  LCSW Treatment Plan for Primary Diagnosis: Bipolar I disorder, severe, current or most recent episode depressed, with psychotic features (HCC) Long Term Goal(s): Safe transition to appropriate next level of care at discharge, Engage patient in therapeutic group addressing interpersonal concerns.  Short Term Goals: Engage patient in aftercare planning with referrals and resources, Increase social support and Increase skills for wellness and recovery  Therapeutic Interventions: Assess for all discharge needs, 1 to 1 time with Social worker, Explore available resources and support systems, Assess for adequacy in community support network, Educate family and significant other(s) on suicide prevention, Complete Psychosocial Assessment, Interpersonal group therapy.  Evaluation of Outcomes: Progressing   Progress in Treatment: Attending groups:  Yes. Participating in groups: Yes. Taking medication as prescribed: Yes. Toleration medication: Yes. Family/Significant other contact made: Yes, individual(s) contacted:  with husband Patient understands diagnosis: Yes. Discussing patient identified problems/goals with staff: Yes. Medical problems stabilized or resolved: Yes. Denies suicidal/homicidal ideation: Yes. Issues/concerns per patient self-inventory: No. Other: none  New problem(s) identified: No, Describe:  none  New Short Term/Long Term Goal(s):  Patient Goals:  "get over suicidal thoughts, anger management"  Discharge Plan or Barriers:   Reason for Continuation of Hospitalization: Depression Medication stabilization  Estimated Length of Stay: 2-4 days.  Attendees: Patient: Jamie [name] 05/19/2018   Physician: Dr. Altamese [place], MD 05/19/2018   Nursing: Roddie Mc, RN 05/19/2018   RN Care Manager: 05/19/2018   Social Worker: Daleen Squibb, LCSW 05/19/2018   Recreational Therapist:  05/19/2018   Other:  05/19/2018   Other:  05/19/2018   Other: 05/19/2018        Scribe for Treatment Team: Lorri Frederick, LCSW 05/19/2018 11:27 AM

## 2018-05-20 LAB — PROLACTIN: Prolactin: 3.7 ng/mL — ABNORMAL LOW (ref 4.8–23.3)

## 2018-05-20 MED ORDER — ONDANSETRON 4 MG PO TBDP
4.0000 mg | ORAL_TABLET | Freq: Four times a day (QID) | ORAL | Status: DC | PRN
  Administered 2018-05-20 – 2018-05-21 (×2): 4 mg via ORAL
  Filled 2018-05-20 (×2): qty 1

## 2018-05-20 MED ORDER — PANTOPRAZOLE SODIUM 40 MG PO TBEC
40.0000 mg | DELAYED_RELEASE_TABLET | Freq: Every day | ORAL | Status: DC
  Administered 2018-05-21 – 2018-05-22 (×2): 40 mg via ORAL
  Filled 2018-05-20 (×4): qty 1

## 2018-05-20 NOTE — Progress Notes (Signed)
DAR NOTE: Pt present with flat affect and depressed mood in the unit. Pt has been isolating a lot. Pt complained of back pain, took all her meds as scheduled. As per self inventory, pt had a good night sleep, good appetite, low energy, and good concentration. Pt rate depression at 02, hopeless ness at 02, and anxiety at 2.  Pt's safety ensured with 15 minute and environmental checks. Pt currently denies SI/HI and A/V hallucinations. Pt verbally agrees to seek staff if SI/HI or A/VH occurs and to consult with staff before acting on these thoughts. Will continue POC.

## 2018-05-20 NOTE — [facility] Group Notes (Signed)
  [facility] LCSW Group Therapy Note  Date/Time:  05/20/2018 11:15AM-12:00PM  Type of Therapy and Topic:  Group Therapy:  Feelings About Hospitalization  Participation Level:  Active   Description of Group This process group involved patients discussing their feelings related to being hospitalized, as well as the benefits they see to being in the hospital.  These feelings and benefits were itemized.  The group then brainstormed specific ways in which they could seek those same benefits when they discharge and return home.  Therapeutic Goals 1. Patient will identify and describe positive and negative feelings related to hospitalization 2. Patient will verbalize benefits of hospitalization to themselves personally 3. Patients will brainstorm together ways they can obtain similar benefits in the outpatient setting, identify barriers to wellness and possible solutions  Summary of Patient Progress:  The patient expressed her primary feelings about being hospitalized are "grateful to be somewhere and getting treatment."  She was very attentive throughout group even though she did not talk.  Therapeutic Modalities Cognitive Behavioral Therapy Motivational Interviewing    Ambrose Mantle, LCSW 05/20/2018, 1:16 PM

## 2018-05-20 NOTE — Progress Notes (Signed)
D    Pt depressed and sad    Pt complained of nausea and after trying gingerale with no success PA was notified and she received orders for Zofran   Pt is cooperative and her behavior is appropriate    She tends to stay in her room and did so this evening A   Verbal support and encouragement given    Medications administered and effectiveness monitored   Q 15 min checks  R   Pt is safe at this time
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
## 2018-05-21 NOTE — [facility] Group Notes (Signed)
[facility] Group Notes:  (Nursing)  Date:  05/21/2018  Time: 400 PM Type of Therapy:  Nurse Education  Participation Level:  Active  Participation Quality:  Appropriate and Attentive  Affect:  Appropriate  Cognitive:  Appropriate  Insight:  Appropriate  Engagement in Group:  Engaged  Modes of Intervention:  Discussion and Education  Summary of Progress/Problems: Nurse led relaxation group Shela Nevin 05/21/2018, 7:43 PM

## 2018-05-21 NOTE — Progress Notes (Signed)
D:  Patient's self inventory sheet, patient sleeps good, no sleep medication.  Good appetite, low energy level, good concentration.  Rated depression and anxiety 2, denied hopeless.  Denied withdrawals.  Denied SI.  Physical problems, back pain.  Worst pain in past 24 hours #6, back, no pain medicine.  Goal is attend group.  Plans to stay awake.  Does have discharge plans. A:  Medications administered per MD orders.  Emotional support and encouragement given patient. R:  Denied SI and HI, contracts for safety.  Denied A/V hallucinations.  Safety maintained with 15 minute checks.

## 2018-05-21 NOTE — [facility] Group Notes (Signed)
[facility] LCSW Group Therapy Note  Date/Time:  05/21/2018  11:00AM-12:00PM  Type of Therapy and Topic:  Group Therapy:  Music and Mood  Participation Level:  Active   Description of Group: In this process group, members listened to a variety of genres of music and identified that different types of music evoke different responses.  Patients were encouraged to identify music that was soothing for them and music that was energizing for them.  Patients discussed how this knowledge can help with wellness and recovery in various ways including managing depression and anxiety as well as encouraging healthy sleep habits.    Therapeutic Goals: 1. Patients will explore the impact of different varieties of music on mood 2. Patients will verbalize the thoughts they have when listening to different types of music 3. Patients will identify music that is soothing to them as well as music that is energizing to them 4. Patients will discuss how to use this knowledge to assist in maintaining wellness and recovery 5. Patients will explore the use of music as a coping skill  Summary of Patient Progress:  At the beginning of group, patient expressed that she felt tired and at the end of group said she felt the same.  She showed no affective changes throughout group.  Therapeutic Modalities: Solution Focused Brief Therapy Activity   Ambrose Mantle, LCSW
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
## 2018-05-21 NOTE — [facility] Group Notes (Signed)
[facility] Group Notes:  (Nursing/MHT/Case Management/Adjunct)  Date:  05/21/2018  Time:  9-10:00 am  Type of Therapy:  Psychoeducational Skills and Goals Group  Participation Level:  Did Not Attend  Participation Quality:    Affect:    Cognitive:    Insight:    Engagement in Group:    Modes of Intervention:    Summary of Progress/Problems:  Jamie [name] 05/21/2018, 10:56 AM

## 2018-05-21 NOTE — Plan of Care (Signed)
Nurse discussed anxiety, depression, coping skills with patient. 

## 2018-05-22 MED ORDER — ARIPIPRAZOLE 10 MG PO TABS: 10.0000 mg | ORAL_TABLET | Freq: Every day | ORAL | 0 refills | Status: DC

## 2018-05-22 MED ORDER — TRAZODONE HCL 100 MG PO TABS: 400.0000 mg | ORAL_TABLET | Freq: Every day | ORAL | 1 refills | Status: DC

## 2018-05-22 NOTE — Plan of Care (Signed)
Pt attended group in a calm and appropriate mood at completion of recreation therapy group sessions.  Caroll Rancher, LRT/CTRS

## 2018-05-22 NOTE — [facility] Group Notes (Signed)
[facility] LCSW Group Therapy Note  Date/Time:05/22/18, 1315  Type of Therapy and Topic:  Group Therapy:  Overcoming Obstacles  Participation Level:  Did not attend  Description of Group:    In this group patients will be encouraged to explore what they see as obstacles to their own wellness and recovery. They will be guided to discuss their thoughts, feelings, and behaviors related to these obstacles. The group will process together ways to cope with barriers, with attention given to specific choices patients can make. Each patient will be challenged to identify changes they are motivated to make in order to overcome their obstacles. This group will be process-oriented, with patients participating in exploration of their own experiences as well as giving and receiving support and challenge from other group members.  Therapeutic Goals: 1. Patient will identify personal and current obstacles as they relate to admission. 2. Patient will identify barriers that currently interfere with their wellness or overcoming obstacles.  3. Patient will identify feelings, thought process and behaviors related to these barriers. 4. Patient will identify two changes they are willing to make to overcome these obstacles:    Summary of Patient Progress      Therapeutic Modalities:   Cognitive Behavioral Therapy Solution Focused Therapy Motivational Interviewing Relapse Prevention Therapy  Greg Rosalyn Archambault, LCSW 

## 2018-05-22 NOTE — Progress Notes (Signed)
D: Pt denies SI/HI/AVH. Pt is pleasant and cooperative. Pt stated she was doing better . Pt was visible on the milieu with peers.  A: Pt was offered support and encouragement. Pt was given scheduled medications. Pt was encourage to attend groups. Q 15 minute checks were done for safety.  R:Pt attends groups and interacts well with peers and staff. Pt is taking medication. Pt has no complaints.Pt receptive to treatment and safety maintained on unit.  Problem: Activity: Goal: Sleeping patterns will improve Outcome: Progressing   Problem: Health Behavior/Discharge Planning: Goal: Compliance with treatment plan for underlying cause of condition will improve Outcome: Progressing   Problem: Safety: Goal: Periods of time without injury will increase Outcome: Progressing   Problem: Coping: Goal: Coping ability will improve Outcome: Progressing

## 2018-05-22 NOTE — Discharge Summary (Signed)
Physician Discharge Summary Note  Patient:  Jamie [name] is an 47 y.o., female MRN:  [id number] DOB:  September 16, 1970 Patient phone:  [phone] (home)  Patient address:   [address] [place] [address],  Total Time spent with patient: 30 minutes  Date of Admission:  05/17/2018 Date of Discharge: 05/22/2018  Reason for Admission: worsening depression, SI, CAH  Principal Problem: Bipolar I disorder, severe, current or most recent episode depressed, with psychotic features [facility]) Discharge Diagnoses: Patient Active Problem List   Diagnosis Date Noted  . Common migraine [G43.009] 11/03/2016  . OSA on CPAP [G47.33, Z99.89] 11/03/2016  . Lumbar herniated disc [M51.26] 01/22/2014  . Headache [R51] 12/06/2012  . Bipolar I disorder, severe, current or most recent episode depressed, with psychotic features (HCC) [F31.5] 11/15/2012  . Severe major depression with psychotic features, mood-congruent (HCC) [F32.3] 05/03/2012  . Generalized anxiety disorder [F41.1] 06/09/2011    Past Psychiatric History: see H&P  Past Medical History:  Past Medical History:  Diagnosis Date  . Anxiety   . Bipolar 1 disorder (HCC)   . Complication of anesthesia   . Depression   . GERD (gastroesophageal reflux disease)   . H/O hiatal hernia   . Headache(784.0)   . High cholesterol   . Hypertension   . PCOS (polycystic ovarian syndrome)   . PONV (postoperative nausea and vomiting)    nausea  . Sleep apnea    CPAP    Past Surgical History:  Procedure Laterality Date  . ANAL FISSURE REPAIR     x 3  . BACK SURGERY  2009  . CHOLECYSTECTOMY  07/2002  . KNEE ARTHROSCOPY Right   . LUMBAR LAMINECTOMY/DECOMPRESSION MICRODISCECTOMY Right 01/22/2014   Procedure: LUMBAR FOUR TO FIVE LUMBAR LAMINECTOMY/DECOMPRESSION MICRODISCECTOMY 1 LEVEL;  Surgeon: Karn Cassis, MD;  Location: [facility];  Service: Neurosurgery;  Laterality: Right;  Right L45 diskectomy   Family History:  Family History   Problem Relation Age of Onset  . Anxiety disorder Maternal Grandmother   . Depression Maternal Grandmother    Family Psychiatric  History: see H&P Social History:  Social History   Substance and Sexual Activity  Alcohol Use No     Social History   Substance and Sexual Activity  Drug Use No    Social History   Socioeconomic History  . Marital status: Married    Spouse name: Not on file  . Number of children: 0  . Years of education: college  . Highest education level: Not on file  Occupational History  . Occupation: disabled  Social Needs  . Financial resource strain: Not on file  . Food insecurity:    Worry: Not on file    Inability: Not on file  . Transportation needs:    Medical: Not on file    Non-medical: Not on file  Tobacco Use  . Smoking status: Never Smoker  . Smokeless tobacco: Never Used  Substance and Sexual Activity  . Alcohol use: No  . Drug use: No  . Sexual activity: Yes    Partners: Male    Birth control/protection: Pill  Lifestyle  . Physical activity:    Days per week: Not on file    Minutes per session: Not on file  . Stress: Not on file  Relationships  . Social connections:    Talks on phone: Not on file    Gets together: Not on file    Attends religious service: Not on file    Active  member of club or organization: Not on file    Attends meetings of clubs or organizations: Not on file    Relationship status: Not on file  Other Topics Concern  . Not on file  Social History Narrative   Patient lives at home alone    Patient is right handed   Patient drinks soda's daily    Hospital Course:    Jamie [name] is a 47 y/o F with history of bipolar disorder who was admitted voluntarily from [facility] where she presented after she was directed by her outpatient provider after she had expressed worsening depression and SI with plan to overdose and inability to contract for safety at an outpatient follow up appointment. Pt was medically  cleared and then transferred to [facility] for additional treatment and stabilization. She reported that she has been following up closely with her outpatient provider, and she is able to name all of her medications and doses, which she reports have not been changed recently. She was continued on Wellbutrin XL 300mg  qDay, klonopin 1mg  qAM + 2mg  qhs, cymbalta 120mg  qhs, and trazodone 400mg  po qhs. She also had been taking geodon 80mg  po BID (with adequate calories) and she agreed to discontinuing geodon and start a trial of abilify. Pt tolerated her medication changes well and she had improvement of her presenting symptoms.  Today upon evaluation, pt shares, "I'm feeling much better." She denies any specific concerns. She is sleeping well. Her appetite is good. She denies other physical complaints. She denies SI/HI/AH/VH. She is tolerating her medications well, and she is in agreement to continue her current regimen without changes. She plans to return to having follow up with her previous outpatient provider. She was able to engage in safety planning including plan to return to [facility] or contact emergency services if she feels unable to maintain her own safety or the safety of others. Pt had no further questions, comments, or concerns.   The patient is at low risk of imminent suicide. Patient denied thoughts, intent, or plan for harm to self or others, expressed significant future orientation, and expressed an ability to mobilize assistance for her needs. She is presently void of any contributing psychiatric symptoms, cognitive difficulties, or substance use which would elevate her risk for lethality. Chronic risk for lethality is elevated in light of poor social support and impulsivity. The chronic risk is presently mitigated by her ongoing desire and engagement in [facility] treatment and mobilization of support from family and friends. Chronic risk may elevate if she experiences any significant loss or worsening of symptoms,  which can be managed and monitored through outpatient providers. At this time, acute risk for lethality is low and she is stable for ongoing outpatient management.    Modifiable risk factors were addressed during this hospitalization through appropriate pharmacotherapy and establishment of outpatient follow-up treatment. Some risk factors for suicide are situational (i.e. Unstable social support) or related personality pathology (i.e. Poor coping mechanisms) and thus cannot be further mitigated by continued hospitalization in this setting.  Physical Findings: AIMS: Facial and Oral Movements Muscles of Facial Expression: None, normal Lips and Perioral Area: None, normal Jaw: None, normal Tongue: None, normal,Extremity Movements Upper (arms, wrists, hands, fingers): None, normal Lower (legs, knees, ankles, toes): None, normal, Trunk Movements Neck, shoulders, hips: None, normal, Overall Severity Severity of abnormal movements (highest score from questions above): None, normal Incapacitation due to abnormal movements: None, normal Patient's awareness of abnormal movements (rate only patient's report): No Awareness, Dental Status  Current problems with teeth and/or dentures?: No Does patient usually wear dentures?: No  CIWA:  CIWA-Ar Total: 1 COWS:     Musculoskeletal: Strength & Muscle Tone: within normal limits Gait & Station: normal Patient leans: [name]  Psychiatric Specialty Exam: Physical Exam  Nursing note and vitals reviewed.   Review of Systems  Constitutional: Negative for chills and fever.  Respiratory: Negative for cough and shortness of breath.   Cardiovascular: Negative for chest pain.  Gastrointestinal: Negative for abdominal pain, heartburn, nausea and vomiting.  Psychiatric/Behavioral: Negative for depression, hallucinations and suicidal ideas. The patient is not nervous/anxious and does not have insomnia.     Blood pressure 114/82, pulse (!) 114, temperature 97.7 F  (36.5 C), temperature source Oral, resp. rate 20, height 5\' 3"  (1.6 m), weight 112.5 kg.Body mass index is 43.93 kg/m.  General Appearance: Casual and Fairly Groomed  Eye Contact:  Good  Speech:  Clear and Coherent and Normal Rate  Volume:  Normal  Mood:  Euthymic  Affect:  Appropriate and Congruent  Thought Process:  Coherent and Goal Directed  Orientation:  Full (Time, Place, and Person)  Thought Content:  Logical  Suicidal Thoughts:  No  Homicidal Thoughts:  No  Memory:  Immediate;   Fair Recent;   Fair Remote;   Fair  Judgement:  Fair  Insight:  Lacking  Psychomotor Activity:  Normal  Concentration:  Concentration: Fair  Recall:  Fair  Fund of Knowledge:  Fair  Language:  Fair  Akathisia:  No  Handed:    AIMS (if indicated):     Assets:  Desire for Improvement Resilience  ADL's:  Intact  Cognition:  WNL  Sleep:  Number of Hours: 6.75     Have you used any form of tobacco in the last 30 days? (Cigarettes, Smokeless Tobacco, Cigars, and/or Pipes): No  Has this patient used any form of tobacco in the last 30 days? (Cigarettes, Smokeless Tobacco, Cigars, and/or Pipes) Yes, Yes, A prescription for an FDA-approved tobacco cessation medication was offered at discharge and the patient refused  Blood Alcohol level:  Lab Results  Component Value Date   [facility] <10 05/17/2018   ETH <11 06/07/2011    Metabolic Disorder Labs:  Lab Results  Component Value Date   HGBA1C 6.2 (H) 05/19/2018   MPG 131.24 05/19/2018   Lab Results  Component Value Date   PROLACTIN 3.7 (L) 05/19/2018   Lab Results  Component Value Date   CHOL 158 05/19/2018   TRIG 448 (H) 05/19/2018   HDL 38 (L) 05/19/2018   CHOLHDL 4.2 05/19/2018   VLDL UNABLE TO CALCULATE IF TRIGLYCERIDE OVER 400 mg/dL 16/04/9603   LDLCALC UNABLE TO CALCULATE IF TRIGLYCERIDE OVER 400 mg/dL 54/03/8118   See Psychiatric Specialty Exam and Suicide Risk Assessment completed by Attending Physician prior to  discharge.  Discharge destination:  Home  Is patient on multiple antipsychotic therapies at discharge:  No   Has Patient had three or more failed trials of antipsychotic monotherapy by history:  No  Recommended Plan for Multiple Antipsychotic Therapies: NA  Allergies as of 05/22/2018      Reactions   Codeine Hives, Swelling   Morphine And Related Swelling, Rash   7/2: Pt state is OK in small doses and is willing to take Morphine this admission      Medication List    STOP taking these medications   acetaminophen 500 MG tablet Commonly known as:  TYLENOL   ziprasidone 80 MG capsule Commonly known  as:  GEODON     TAKE these medications     Indication  ARIPiprazole 10 MG tablet Commonly known as:  ABILIFY Take 1 tablet (10 mg total) by mouth daily. Start taking on:  05/23/2018  Indication:  MIXED BIPOLAR AFFECTIVE DISORDER   buPROPion 150 MG 24 hr tablet Commonly known as:  WELLBUTRIN XL Take 2 tablets (300 mg total) by mouth daily.  Indication:  Bipolar Disorder   clonazePAM 1 MG tablet Commonly known as:  KLONOPIN 1 qam 2  qhs  Indication:  anxiety   DULoxetine 60 MG capsule Commonly known as:  CYMBALTA Take 2 capsules (120 mg total) by mouth daily.  Indication:  Bipolar   fluticasone 50 MCG/ACT nasal spray Commonly known as:  FLONASE Place 1 spray into both nostrils daily as needed for allergies or rhinitis.  Indication:  Allergic Rhinitis   lisinopril 5 MG tablet Commonly known as:  PRINIVIL,ZESTRIL Take 5 mg by mouth daily.  Indication:  High Blood Pressure Disorder   metformin 500 MG (OSM) 24 hr tablet Commonly known as:  FORTAMET Take 1,500 mg by mouth daily with breakfast.  Indication:  Type 2 Diabetes   omeprazole 20 MG capsule Commonly known as:  PRILOSEC Take 1 capsule (20 mg total) by mouth daily.  Indication:  Gastroesophageal Reflux Disease   rosuvastatin 20 MG tablet Commonly known as:  CRESTOR Take 20 mg by mouth daily.   Indication:  High Amount of Fats in the Blood   topiramate 50 MG tablet Commonly known as:  TOPAMAX Take 1 tablet (50 mg total) by mouth 2 (two) times daily.  Indication:  Migraine Headache   traZODone 100 MG tablet Commonly known as:  DESYREL Take 4 tablets (400 mg total) by mouth at bedtime. What changed:    how much to take  how to take this  when to take this  additional instructions  Indication:  Trouble Sleeping       Follow-up recommendations:  Activity:  as tolerated Diet:  normal Tests:  NA Other:  see above for DC plan  Comments:    Signed: Micheal Likens, MD 05/22/2018, 10:45 AM

## 2018-05-22 NOTE — Progress Notes (Signed)
Patient discharged to lobby. Patient was stable and appreciative at that time. All papers and prescriptions were given and valuables returned. Verbal understanding expressed. Denies SI/HI and A/VH. Patient given opportunity to express concerns and ask questions.  

## 2018-05-22 NOTE — [facility] Suicide Risk Assessment (Signed)
[facility] Discharge Suicide Risk Assessment   Principal Problem: Bipolar I disorder, severe, current or most recent episode depressed, with psychotic features [facility]) Discharge Diagnoses:  Patient Active Problem List   Diagnosis Date Noted  . Common migraine [G43.009] 11/03/2016  . OSA on CPAP [G47.33, Z99.89] 11/03/2016  . Lumbar herniated disc [M51.26] 01/22/2014  . Headache [R51] 12/06/2012  . Bipolar I disorder, severe, current or most recent episode depressed, with psychotic features (HCC) [F31.5] 11/15/2012  . Severe major depression with psychotic features, mood-congruent (HCC) [F32.3] 05/03/2012  . Generalized anxiety disorder [F41.1] 06/09/2011    Total Time spent with patient: 30 minutes   Psychiatric Specialty Exam: Review of Systems  Constitutional: Negative for chills and fever.  Respiratory: Negative for cough and shortness of breath.   Cardiovascular: Negative for chest pain.  Gastrointestinal: Negative for abdominal pain, heartburn, nausea and vomiting.  Psychiatric/Behavioral: Negative for depression, hallucinations and suicidal ideas. The patient is not nervous/anxious and does not have insomnia.     Blood pressure 114/82, pulse (!) 114, temperature 97.7 F (36.5 C), temperature source Oral, resp. rate 20, height 5\' 3"  (1.6 m), weight 112.5 kg.Body mass index is 43.93 kg/m.   Mental Status Per Nursing Assessment::   On Admission:  Suicidal ideation indicated by patient, Suicide plan, Plan includes specific time, place, or method  Demographic Factors:  Caucasian, Low socioeconomic status and Unemployed  Loss Factors: Decrease in vocational status and Financial problems/change in socioeconomic status  Historical Factors: Impulsivity  Risk Reduction Factors:   Sense of responsibility to family, Living with another person, especially a relative, Positive social support, Positive therapeutic relationship and Positive coping skills or problem solving  skills  Continued Clinical Symptoms:  Bipolar Disorder:   Mixed State  Cognitive Features That Contribute To Risk:  None    Suicide Risk:  Minimal: No identifiable suicidal ideation.  Patients presenting with no risk factors but with morbid ruminations; may be classified as minimal risk based on the severity of the depressive symptoms  Follow-up Information    [facility]. Go on 06/28/2018.   Specialty:  Behavioral Health Why:  You are scheduled to see Dr Donell Beers on Wed, 06/28/18, at 4:15pm.  Please contact the office after discharge as they have you on the list for an earlier appt once one becomes available.  Contact information: [address] [place] [address] [phone]          Plan Of Care/Follow-up recommendations:  Activity:  as tolerated Diet:  normal Tests:  NA Other:  see above for DC plan  Micheal Likens, MD 05/22/2018, 11:18 AM

## 2018-05-22 NOTE — Progress Notes (Signed)
  [facility] Adult Case Management Discharge Plan :  Will you be returning to the same living situation after discharge:  Yes,  with husband At discharge, do you have transportation home?: Yes,  mother Do you have the ability to pay for your medications: Yes,  [facility] Medicare  Release of information consent forms completed and in the chart;  Patient's signature needed at discharge.  Patient to Follow up at: Follow-up Information    [facility]. Go on 06/28/2018.   Specialty:  Behavioral Health Why:  You are scheduled to see Dr Donell Beers on Wed, 06/28/18, at 4:15pm.  Please contact the office after discharge as they have you on the list for an earlier appt once one becomes available.  Contact information: [address] [place] [address] [phone]       [facility] Follow up.   Why:  Your Child psychotherapist will call you when Merlene Pulling gives a follow up appointment date.  Please follow up by texting Jasmine December as we discussed as well.   Contact information: [address] [place], [place] [address] P: [phone]          Next level of care provider has access to [facility] Link:yes  Safety Planning and Suicide Prevention discussed: Yes,  with husband  Have you used any form of tobacco in the last 30 days? (Cigarettes, Smokeless Tobacco, Cigars, and/or Pipes): No  Has patient been referred to the Quitline?: [name] patient is not a smoker  Patient has been referred for addiction treatment: [name]  Lorri Frederick, LCSW 05/22/2018, 12:57 PM

## 2018-05-22 NOTE — Progress Notes (Signed)
Recreation Therapy Notes  Date: 10.28.19 Time: 1000 Location: [address] Dayroom  Group Topic:  Goal Setting  Goal Area(s) Addresses:  Patient will be able to identify at least 3 life goals.   Patient will be able to identify benefit of investing in life goals.  Patient will be able to identify benefit of setting life goals.   Behavioral Response:  Engaged  Intervention: Worksheet, pencils  Activity: Life Goals.  Patients were to identify goals they wanted to accomplish in a wee, month, year and five years.  Patients were to then identify the obstacles they would face, what they needed in order to be successful and what they could start doing to work towards their goals.  Education:  Discharge Planning, Coping Skills, Life Goals   Education Outcome: Acknowledges Education/In Group Clarification Provided/Needs Additional Education  Clinical Observations: Pt stated goals "give you something to look forward to".  Pt stated in a week she wants to get adjusted to her medications; in a month go Christmas shopping; in a year go on a trip and in five years be debt free.  Pt identified her obstacles as financial emergencies; needs to cut back on unnecessary spending and start by taking her medications.     Caroll Rancher, LRT/CTRS       Lillia Abed, Etoile Looman A 05/22/2018 11:17 AM

## 2018-05-22 NOTE — Progress Notes (Signed)
Recreation Therapy Notes  INPATIENT RECREATION TR PLAN  Patient Details Name: Jamie [name] MRN: [id number] DOB: March 21, 1971 Today's Date: 05/22/2018  Rec Therapy Plan Is patient appropriate for Therapeutic Recreation?: Yes Treatment times per week: about 3 days Estimated Length of Stay: 5-7 days TR Treatment/Interventions: Group participation (Comment)  Discharge Criteria Pt will be discharged from therapy if:: Discharged Treatment plan/goals/alternatives discussed and agreed upon by:: Patient/family  Discharge Summary Short term goals set: See patient care plan Short term goals met: Adequate for discharge Progress toward goals comments: Groups attended Which groups?: Goal setting, Leisure education Reason goals not met: None Therapeutic equipment acquired: [name] Reason patient discharged from therapy: Discharge from hospital Pt/family agrees with progress & goals achieved: Yes Date patient discharged from therapy: 05/22/18    Victorino Sparrow, LRT/CTRS  Ria Comment, Leighton Brickley A 05/22/2018, 11:44 AM

## 2018-05-25 NOTE — Progress Notes (Signed)
CSW spoke to pt regarding follow up appt with therapist.  Pt reports she was able to contact Jasmine December by phone and has an appt on Monday.  All set. Garner Nash, MSW, LCSW Clinical Social Worker 05/25/2018 10:54 AM

## 2018-06-28 ENCOUNTER — Encounter (HOSPITAL_COMMUNITY): Payer: Self-pay | Admitting: Psychiatry

## 2018-06-28 ENCOUNTER — Encounter

## 2018-06-28 ENCOUNTER — Ambulatory Visit (INDEPENDENT_AMBULATORY_CARE_PROVIDER_SITE_OTHER): Payer: Medicare Other | Admitting: Psychiatry

## 2018-06-28 VITALS — BP 136/80 | Ht 63.0 in | Wt 246.0 lb

## 2018-06-28 DIAGNOSIS — F323 Major depressive disorder, single episode, severe with psychotic features: Secondary | ICD-10-CM

## 2018-06-28 DIAGNOSIS — F325 Major depressive disorder, single episode, in full remission: Secondary | ICD-10-CM

## 2018-06-28 MED ORDER — TRAZODONE HCL 100 MG PO TABS: 400.0000 mg | ORAL_TABLET | Freq: Every day | ORAL | 6 refills | Status: DC

## 2018-06-28 MED ORDER — ARIPIPRAZOLE 10 MG PO TABS: 10.0000 mg | ORAL_TABLET | Freq: Every day | ORAL | 4 refills | Status: DC

## 2018-06-28 MED ORDER — BUPROPION HCL ER (XL) 150 MG PO TB24: 300.0000 mg | ORAL_TABLET | Freq: Every day | ORAL | 5 refills | Status: DC

## 2018-06-28 MED ORDER — DULOXETINE HCL 60 MG PO CPEP: 120.0000 mg | ORAL_CAPSULE | Freq: Every day | ORAL | 7 refills | Status: DC

## 2018-06-28 MED ORDER — CLONAZEPAM 1 MG PO TABS: ORAL_TABLET | ORAL | 5 refills | Status: DC

## 2018-06-28 NOTE — Progress Notes (Signed)
Patient ID: Jamie [name], female   DOB: [birth year], 47 y.o.   MRN: [id number] [facility] MD Progress Note  06/28/2018 4:41 PM Jamie [name]  MRN:  [id number] Subjective:  Feeling well. Principal Problem: This patient was hospitalized on her last visit.  He was hospitalized at [facility] for 6 days.  She is been out of the hospital for 1 month.  Today she seen her husband.  Patient is doing very well.  She feels much much better.  The change discontinued her Geodon and begin her on Abilify at a good dose.  Clearly has a beneficial mood.  It clearly helps her great deal.  Patient denies being depressed.  She is sleeping and eating well.  She no longer suicidal.  She denies any psychomotor changes.  She drinks no alcohol and uses no drugs.  It seems the stressor was related to her sister and brother-in-law who are living with her and was screaming and yelling.  They are making the environment very chaotic and very tense.  They seem to have calmed down now as there is still all living together.  I do believe that the environment was the stressor and it is somewhat adjusted.  When she came back from being in the hospital they were much nicer to her and she felt much better.  The tension in the environment is somewhat increasing as were heading towards the holidays.  The patient will discuss all this with her therapist about ways to try to avoid decompensating.  At this time we will continue all medications that she was prescribed.  This patient came 20 minutes late for her 30-minute visit. Past Psychiatric History:   Past Medical History:  Diagnosis Date  . Anxiety   . Bipolar 1 disorder (HCC)   . Complication of anesthesia   . Depression   . GERD (gastroesophageal reflux disease)   . H/O hiatal hernia   . Headache(784.0)   . High cholesterol   . Hypertension   . PCOS (polycystic ovarian syndrome)   . PONV (postoperative nausea and vomiting)    nausea  . Sleep apnea    CPAP     Past Surgical History:  Procedure Laterality Date  . ANAL FISSURE REPAIR     x 3  . BACK SURGERY  2009  . CHOLECYSTECTOMY  07/2002  . KNEE ARTHROSCOPY Right   . LUMBAR LAMINECTOMY/DECOMPRESSION MICRODISCECTOMY Right 01/22/2014   Procedure: LUMBAR FOUR TO FIVE LUMBAR LAMINECTOMY/DECOMPRESSION MICRODISCECTOMY 1 LEVEL;  Surgeon: Karn Cassis, MD;  Location: [facility];  Service: Neurosurgery;  Laterality: Right;  Right L45 diskectomy   Family History:  Family History  Problem Relation Age of Onset  . Anxiety disorder Maternal Grandmother   . Depression Maternal Grandmother    Family Psychiatric  History:  Social History:  Social History   Substance and Sexual Activity  Alcohol Use No     Social History   Substance and Sexual Activity  Drug Use No    Social History   Socioeconomic History  . Marital status: Married    Spouse name: Not on file  . Number of children: 0  . Years of education: college  . Highest education level: Not on file  Occupational History  . Occupation: disabled  Social Needs  . Financial resource strain: Not on file  . Food insecurity:    Worry: Not on file    Inability: Not on file  . Transportation needs:    Medical: Not on  file    Non-medical: Not on file  Tobacco Use  . Smoking status: Never Smoker  . Smokeless tobacco: Never Used  Substance and Sexual Activity  . Alcohol use: No  . Drug use: No  . Sexual activity: Yes    Partners: Male    Birth control/protection: Pill  Lifestyle  . Physical activity:    Days per week: Not on file    Minutes per session: Not on file  . Stress: Not on file  Relationships  . Social connections:    Talks on phone: Not on file    Gets together: Not on file    Attends religious service: Not on file    Active member of club or organization: Not on file    Attends meetings of clubs or organizations: Not on file    Relationship status: Not on file  Other Topics Concern  . Not on file  Social  History Narrative   Patient lives at home alone    Patient is right handed   Patient drinks soda's daily   Additional Social History:                         Sleep: Fair  Appetite:  Good  Current Medications: Current Outpatient Medications  Medication Sig Dispense Refill  . ARIPiprazole (ABILIFY) 10 MG tablet Take 1 tablet (10 mg total) by mouth daily. 30 tablet 4  . buPROPion (WELLBUTRIN XL) 150 MG 24 hr tablet Take 2 tablets (300 mg total) by mouth daily. 90 tablet 5  . clonazePAM (KLONOPIN) 1 MG tablet 1 qam 2  qhs 90 tablet 5  . DULoxetine (CYMBALTA) 60 MG capsule Take 2 capsules (120 mg total) by mouth daily. 180 capsule 7  . fluticasone (FLONASE) 50 MCG/ACT nasal spray Place 1 spray into both nostrils daily as needed for allergies or rhinitis.    [name] Kitchen. lisinopril (PRINIVIL,ZESTRIL) 5 MG tablet Take 5 mg by mouth daily.    . metformin (FORTAMET) 500 MG (OSM) 24 hr tablet Take 1,500 mg by mouth daily with breakfast.    . omeprazole (PRILOSEC) 20 MG capsule Take 1 capsule (20 mg total) by mouth daily. 30 capsule 0  . rosuvastatin (CRESTOR) 20 MG tablet Take 20 mg by mouth daily.      [name] Kitchen. topiramate (TOPAMAX) 50 MG tablet Take 1 tablet (50 mg total) by mouth 2 (two) times daily. 180 tablet 3  . traZODone (DESYREL) 100 MG tablet Take 4 tablets (400 mg total) by mouth at bedtime. 30 tablet 6   No current facility-administered medications for this visit.     Lab Results: No results found for this or any previous visit (from the past 48 hour(s)).  Physical Findings: AIMS:  , ,  ,  ,    CIWA:    COWS:     Musculoskeletal: Strength & Muscle Tone: within normal limits Gait & Station: normal Patient leans: Right  Psychiatric Specialty Exam: ROS  Blood pressure 136/80, height 5\' 3"  (1.6 m), weight 246 lb (111.6 kg).Body mass index is 43.58 kg/m.  General Appearance: Casual  Eye Contact::  Good  Speech:  Clear and Coherent  Volume:  Normal  Mood:Depressed  Affect:   Appropriate  Today the patient is doing very well. Thought Process:  Coherent  Orientation:  Full (Time, Place, and Person)  Thought Content:  NA and WDL  Suicidal Thoughts:  yes  Homicidal Thoughts:  No  Memory:  Immediate;   NA  Judgement:  Good  Insight:  Good  Psychomotor Activity:  NA  Concentration:  Good  Recall:  Good  Fund of Knowledge:Good  Language: Good  Akathisia:  No  Handed:  Right  AIMS (if indicated):     Assets:  Desire for Improvement  ADL's:  Intact  Cognition: WNL  Sleep:      Treatment Plan Summary: 06/28/2018, 4:41 PM  At this time the patient's #1 problem is major depression.  She had an episode of decline that she was suicidal.  At this time all of her symptoms seem to have remitted.  At this time she will continue taking her Cymbalta, Wellbutrin, Klonopin 1 mg in the morning and 2 mg at night.  She continue taking her Abilify 10 mg.  The patient is very stable.  She will continue in therapy as prescribed.  She will return to see me in about 2 or 3 months.  Today she is seen with her husband who agrees that she is doing very well.

## 2018-07-20 DIAGNOSIS — G4733 Obstructive sleep apnea (adult) (pediatric): Secondary | ICD-10-CM | POA: Diagnosis not present

## 2018-07-28 ENCOUNTER — Other Ambulatory Visit (HOSPITAL_COMMUNITY): Payer: Self-pay | Admitting: Psychiatry

## 2018-08-22 ENCOUNTER — Ambulatory Visit: Payer: Medicare Other | Admitting: Nurse Practitioner

## 2018-08-26 DIAGNOSIS — Z79899 Other long term (current) drug therapy: Secondary | ICD-10-CM | POA: Diagnosis not present

## 2018-08-26 DIAGNOSIS — E785 Hyperlipidemia, unspecified: Secondary | ICD-10-CM | POA: Diagnosis not present

## 2018-08-26 DIAGNOSIS — I10 Essential (primary) hypertension: Secondary | ICD-10-CM | POA: Diagnosis not present

## 2018-08-26 DIAGNOSIS — Z0389 Encounter for observation for other suspected diseases and conditions ruled out: Secondary | ICD-10-CM | POA: Diagnosis not present

## 2018-08-26 DIAGNOSIS — R079 Chest pain, unspecified: Secondary | ICD-10-CM | POA: Diagnosis not present

## 2018-08-26 DIAGNOSIS — M549 Dorsalgia, unspecified: Secondary | ICD-10-CM | POA: Diagnosis not present

## 2018-08-26 DIAGNOSIS — E781 Pure hyperglyceridemia: Secondary | ICD-10-CM | POA: Diagnosis not present

## 2018-08-26 DIAGNOSIS — E119 Type 2 diabetes mellitus without complications: Secondary | ICD-10-CM | POA: Diagnosis not present

## 2018-08-26 DIAGNOSIS — R0602 Shortness of breath: Secondary | ICD-10-CM | POA: Diagnosis not present

## 2018-09-06 DIAGNOSIS — R0789 Other chest pain: Secondary | ICD-10-CM | POA: Diagnosis not present

## 2018-09-14 DIAGNOSIS — E781 Pure hyperglyceridemia: Secondary | ICD-10-CM | POA: Diagnosis not present

## 2018-09-14 DIAGNOSIS — K219 Gastro-esophageal reflux disease without esophagitis: Secondary | ICD-10-CM | POA: Diagnosis not present

## 2018-09-14 DIAGNOSIS — E782 Mixed hyperlipidemia: Secondary | ICD-10-CM | POA: Diagnosis not present

## 2018-09-14 DIAGNOSIS — I10 Essential (primary) hypertension: Secondary | ICD-10-CM | POA: Diagnosis not present

## 2018-09-19 DIAGNOSIS — I10 Essential (primary) hypertension: Secondary | ICD-10-CM | POA: Diagnosis not present

## 2018-09-19 DIAGNOSIS — E782 Mixed hyperlipidemia: Secondary | ICD-10-CM | POA: Diagnosis not present

## 2018-09-19 DIAGNOSIS — M545 Low back pain: Secondary | ICD-10-CM | POA: Diagnosis not present

## 2018-09-19 DIAGNOSIS — K219 Gastro-esophageal reflux disease without esophagitis: Secondary | ICD-10-CM | POA: Diagnosis not present

## 2018-09-27 ENCOUNTER — Ambulatory Visit (HOSPITAL_COMMUNITY): Payer: Medicare Other | Admitting: Psychiatry

## 2018-09-27 ENCOUNTER — Encounter (HOSPITAL_COMMUNITY): Payer: Self-pay | Admitting: Psychiatry

## 2018-09-27 VITALS — BP 112/72 | HR 102 | Ht 63.0 in | Wt 262.0 lb

## 2018-09-27 DIAGNOSIS — R45851 Suicidal ideations: Secondary | ICD-10-CM | POA: Diagnosis not present

## 2018-09-27 DIAGNOSIS — F3342 Major depressive disorder, recurrent, in full remission: Secondary | ICD-10-CM

## 2018-09-27 DIAGNOSIS — F323 Major depressive disorder, single episode, severe with psychotic features: Secondary | ICD-10-CM | POA: Diagnosis not present

## 2018-09-27 DIAGNOSIS — G4709 Other insomnia: Secondary | ICD-10-CM

## 2018-09-27 MED ORDER — ARIPIPRAZOLE 10 MG PO TABS: 10.0000 mg | ORAL_TABLET | Freq: Every day | ORAL | 5 refills | Status: DC

## 2018-09-27 MED ORDER — BUPROPION HCL ER (XL) 150 MG PO TB24: 300.0000 mg | ORAL_TABLET | Freq: Every day | ORAL | 5 refills | Status: DC

## 2018-09-27 MED ORDER — TRAZODONE HCL 100 MG PO TABS: 400.0000 mg | ORAL_TABLET | Freq: Every day | ORAL | 6 refills | Status: DC

## 2018-09-27 MED ORDER — CLONAZEPAM 1 MG PO TABS: ORAL_TABLET | ORAL | 5 refills | Status: DC

## 2018-09-27 MED ORDER — DULOXETINE HCL 60 MG PO CPEP: 120.0000 mg | ORAL_CAPSULE | Freq: Every day | ORAL | 7 refills | Status: DC

## 2018-09-27 NOTE — Progress Notes (Signed)
Patient ID: Jamie [name], female   DOB: September 08, 1970, 48 y.o.   MRN: [id number] [facility] MD Progress Note  09/27/2018 3:19 PM Jamie [name]  MRN:  [id number] Subjective:  Feeling well. Principal Problem: Major depression recurrent  Today the patient is actually doing well.  Her mood is stable.  She still has the family members who are a bit chaotic in her environment but she is trying not to let them get to her.  The patient actually lives in a large home.  She has 5 bedrooms.  She lives with her sister her sister's boyfriend her mother and her nephew.  The patient states is active as she can.  She also has 3 dogs in the house of 4 dogs out of the house.  The patient drinks no alcohol uses no drugs.  She denies feeling worthless.  She is able to think and concentrate without a problem.  She shows no evidence of psychosis.  She uses no drugs.  Patient takes her medicines just as prescribed.  Her energy level is good.  Patient continue seeing her therapist's name is Merlene Pulling.  She sees her on a regular basis.  The patient is not suicidal.  She denies any chest pain or shortness of breath.  She denies any neurological symptoms.  Her only real complaint is some back pain and she is going to be seeing her pain specialist in the next week.   Past Medical History:  Diagnosis Date  . Anxiety   . Bipolar 1 disorder (HCC)   . Complication of anesthesia   . Depression   . GERD (gastroesophageal reflux disease)   . H/O hiatal hernia   . Headache(784.0)   . High cholesterol   . Hypertension   . PCOS (polycystic ovarian syndrome)   . PONV (postoperative nausea and vomiting)    nausea  . Sleep apnea    CPAP    Past Surgical History:  Procedure Laterality Date  . ANAL FISSURE REPAIR     x 3  . BACK SURGERY  2009  . CHOLECYSTECTOMY  07/2002  . KNEE ARTHROSCOPY Right   . LUMBAR LAMINECTOMY/DECOMPRESSION MICRODISCECTOMY Right 01/22/2014   Procedure: LUMBAR FOUR TO FIVE LUMBAR  LAMINECTOMY/DECOMPRESSION MICRODISCECTOMY 1 LEVEL;  Surgeon: Karn Cassis, MD;  Location: [facility];  Service: Neurosurgery;  Laterality: Right;  Right L45 diskectomy   Family History:  Family History  Problem Relation Age of Onset  . Anxiety disorder Maternal Grandmother   . Depression Maternal Grandmother    Family Psychiatric  History:  Social History:  Social History   Substance and Sexual Activity  Alcohol Use No     Social History   Substance and Sexual Activity  Drug Use No    Social History   Socioeconomic History  . Marital status: Married    Spouse name: Not on file  . Number of children: 0  . Years of education: college  . Highest education level: Not on file  Occupational History  . Occupation: disabled  Social Needs  . Financial resource strain: Not on file  . Food insecurity:    Worry: Not on file    Inability: Not on file  . Transportation needs:    Medical: Not on file    Non-medical: Not on file  Tobacco Use  . Smoking status: Never Smoker  . Smokeless tobacco: Never Used  Substance and Sexual Activity  . Alcohol use: No  . Drug use: No  . Sexual activity:  Yes    Partners: Male    Birth control/protection: Pill  Lifestyle  . Physical activity:    Days per week: Not on file    Minutes per session: Not on file  . Stress: Not on file  Relationships  . Social connections:    Talks on phone: Not on file    Gets together: Not on file    Attends religious service: Not on file    Active member of club or organization: Not on file    Attends meetings of clubs or organizations: Not on file    Relationship status: Not on file  Other Topics Concern  . Not on file  Social History Narrative   Patient lives at home alone    Patient is right handed   Patient drinks soda's daily   Additional Social History:                         Sleep: Fair  Appetite:  Good  Current Medications: Current Outpatient Medications  Medication  Sig Dispense Refill  . ARIPiprazole (ABILIFY) 10 MG tablet Take 1 tablet (10 mg total) by mouth daily. 30 tablet 5  . buPROPion (WELLBUTRIN XL) 150 MG 24 hr tablet Take 2 tablets (300 mg total) by mouth daily. 90 tablet 5  . clonazePAM (KLONOPIN) 1 MG tablet 1 qam 2  qhs 90 tablet 5  . DULoxetine (CYMBALTA) 60 MG capsule Take 2 capsules (120 mg total) by mouth daily. 180 capsule 7  . fluticasone (FLONASE) 50 MCG/ACT nasal spray Place 1 spray into both nostrils daily as needed for allergies or rhinitis.    [name] Kitchen. lisinopril (PRINIVIL,ZESTRIL) 5 MG tablet Take 5 mg by mouth daily.    . metformin (FORTAMET) 500 MG (OSM) 24 hr tablet Take 1,500 mg by mouth daily with breakfast.    . omeprazole (PRILOSEC) 20 MG capsule Take 1 capsule (20 mg total) by mouth daily. 30 capsule 0  . rosuvastatin (CRESTOR) 20 MG tablet Take 20 mg by mouth daily.      [name] Kitchen. topiramate (TOPAMAX) 50 MG tablet Take 1 tablet (50 mg total) by mouth 2 (two) times daily. 180 tablet 3  . traZODone (DESYREL) 100 MG tablet Take 4 tablets (400 mg total) by mouth at bedtime. 30 tablet 6  . TRULICITY 0.75 MG/0.5ML SOPN      No current facility-administered medications for this visit.     Lab Results: No results found for this or any previous visit (from the past 48 hour(s)).  Physical Findings: AIMS:  , ,  ,  ,    CIWA:    COWS:     Musculoskeletal: Strength & Muscle Tone: within normal limits Gait & Station: normal Patient leans: Right  Psychiatric Specialty Exam: ROS  Blood pressure 112/72, pulse (!) 102, height 5\' 3"  (1.6 m), weight 262 lb (118.8 kg), SpO2 97 %.Body mass index is 46.41 kg/m.  General Appearance: Casual  Eye Contact::  Good  Speech:  Clear and Coherent  Volume:  Normal  Mood:Depressed  Affect:  Appropriate  Today the patient is doing very well. Thought Process:  Coherent  Orientation:  Full (Time, Place, and Person)  Thought Content:  NA and WDL  Suicidal Thoughts:  yes  Homicidal Thoughts:  No  Memory:   Immediate;   NA  Judgement:  Good  Insight:  Good  Psychomotor Activity:  NA  Concentration:  Good  Recall:  Good  Fund of Knowledge:Good  Language:  Good  Akathisia:  No  Handed:  Right  AIMS (if indicated):     Assets:  Desire for Improvement  ADL's:  Intact  Cognition: WNL  Sleep:      Treatment Plan Summary: 09/27/2018, 3:19 PM This patient's first problem is that she has major depression with psychosis.  At this time she is doing very well and will continue taking Cymbalta and Wellbutrin.  Successfully she has been changed from Geodon to Abilify and it seems to be doing better.  She feels calmer yet not sedated.  Her second problem is that of insomnia.  She takes trazodone for this condition and does well.  She shows no vegetative symptoms at this time.  She is functioning well.  Her symptomatology would be described as minimal.

## 2018-10-02 NOTE — Progress Notes (Deleted)
[facility]  PATIENT: Jamie FloridaMelinda K [name] DOB: 18-Sep-1970   REASON FOR VISIT: episodic headache, morning headaches obstructive sleep apnea with CPAP HISTORY FROM:patient    HISTORY OF PRESENT ILLNESS:UPDATE 7/15/2019CM Jamie [name], 48 year old female returns for follow-up with history of obstructive sleep apnea and headaches.  Her headaches are in good control on Topamax she needs refills.  She claims she is doing well with her CPAP however her compliance is suboptimal.  Data dated 12/07/2017-02/04/2018( 3 months) shows compliance greater than 4 hours at 35 days or 58%.  Usage days 75%.  Average usage 6 hours 39 minutes.  Set pressure 5 to 10 cm EPR level 3 AHI 2.7 ESS 6. She returns for reevaluation    01/18/17 [name]. Jamie [name], 48 year old female returns for  followup. She has a history of headaches. .Headaches  are in good  control. She was asked to get a sleep study at her last visit and  her sleep study shows mild sleep apnea,  hypoxemia and her apnea was supine and REM sleep dependent. .  Overall her sleepiness and daytime drowsiness has decreased significantly. Her headaches are much better control  She is currently taking Topamax 50 twice a day. She has significant bipolar disorder and sees psychiatry Jamie [name]. She also has a past medical history of hypertension, , depression and anxiety, hyperlipidemia, chronic low back pain, lumbar laminectomy in June 2016  by Dr. Jeral FruitBotero. She also has a history of polycystic disease and obesity. She denies any motor or sensory deficits.   She is here today for CPAP compliance. Data dated 05/27/20182 01/17/2017 shows 83% compliance.25 out of 30 , greater than 4 hours 43% less than 4 hours 40%. Average hours used 4 hours 52 minutes. AutoSet 5 cm to 10 cm. EPR level 3. AHI of 4.8 no leaks. She reports that she has still getting used to the machine and that some nights she falls asleep before putting on. In addition she had a cold and did not use  it.She claims she has less fatigue.She returns for  reevaluation .   REVIEW OF SYSTEMS: Full 14 system review of systems performed and notable only for those listed, all others are neg:  Constitutional: neg  Cardiovascular: neg Ear/Nose/Throat: neg  Skin: neg Eyes: Blurred vision Respiratory: neg Gastroitestinal: neg  Hematology/Lymphatic: neg  Endocrine: neg Musculoskeletal:neg Allergy/Immunology: neg Neurological: Headache improved Psychiatric: Depression anxiety, bipolar disorder followed by psychiatry Sleep : Obstructive sleep apnea with CPAP   ALLERGIES: Allergies  Allergen Reactions  . Codeine Hives and Swelling  . Morphine And Related Swelling and Rash    7/2: Pt state is OK in small doses and is willing to take Morphine this admission    HOME MEDICATIONS: Outpatient Medications Prior to Visit  Medication Sig Dispense Refill  . ARIPiprazole (ABILIFY) 10 MG tablet Take 1 tablet (10 mg total) by mouth daily. 30 tablet 5  . buPROPion (WELLBUTRIN XL) 150 MG 24 hr tablet Take 2 tablets (300 mg total) by mouth daily. 90 tablet 5  . clonazePAM (KLONOPIN) 1 MG tablet 1 qam 2  qhs 90 tablet 5  . DULoxetine (CYMBALTA) 60 MG capsule Take 2 capsules (120 mg total) by mouth daily. 180 capsule 7  . fluticasone (FLONASE) 50 MCG/ACT nasal spray Place 1 spray into both nostrils daily as needed for allergies or rhinitis.    [name] Kitchen. lisinopril (PRINIVIL,ZESTRIL) 5 MG tablet Take 5 mg by mouth daily.    . metformin (FORTAMET) 500 MG (OSM) 24 hr tablet  Take 1,500 mg by mouth daily with breakfast.    . omeprazole (PRILOSEC) 20 MG capsule Take 1 capsule (20 mg total) by mouth daily. 30 capsule 0  . rosuvastatin (CRESTOR) 20 MG tablet Take 20 mg by mouth daily.      [name] Kitchen topiramate (TOPAMAX) 50 MG tablet Take 1 tablet (50 mg total) by mouth 2 (two) times daily. 180 tablet 3  . traZODone (DESYREL) 100 MG tablet Take 4 tablets (400 mg total) by mouth at bedtime. 30 tablet 6  . TRULICITY 0.75 MG/0.5ML  SOPN      No facility-administered medications prior to visit.     PAST MEDICAL HISTORY: Past Medical History:  Diagnosis Date  . Anxiety   . Bipolar 1 disorder (HCC)   . Complication of anesthesia   . Depression   . GERD (gastroesophageal reflux disease)   . H/O hiatal hernia   . Headache(784.0)   . High cholesterol   . Hypertension   . PCOS (polycystic ovarian syndrome)   . PONV (postoperative nausea and vomiting)    nausea  . Sleep apnea    CPAP    PAST SURGICAL HISTORY: Past Surgical History:  Procedure Laterality Date  . ANAL FISSURE REPAIR     x 3  . BACK SURGERY  2009  . CHOLECYSTECTOMY  07/2002  . KNEE ARTHROSCOPY Right   . LUMBAR LAMINECTOMY/DECOMPRESSION MICRODISCECTOMY Right 01/22/2014   Procedure: LUMBAR FOUR TO FIVE LUMBAR LAMINECTOMY/DECOMPRESSION MICRODISCECTOMY 1 LEVEL;  Surgeon: Karn Cassis, MD;  Location: [facility];  Service: Neurosurgery;  Laterality: Right;  Right L45 diskectomy    FAMILY HISTORY: Family History  Problem Relation Age of Onset  . Anxiety disorder Maternal Grandmother   . Depression Maternal Grandmother     SOCIAL HISTORY: Social History   Socioeconomic History  . Marital status: Married    Spouse name: Not on file  . Number of children: 0  . Years of education: college  . Highest education level: Not on file  Occupational History  . Occupation: disabled  Social Needs  . Financial resource strain: Not on file  . Food insecurity:    Worry: Not on file    Inability: Not on file  . Transportation needs:    Medical: Not on file    Non-medical: Not on file  Tobacco Use  . Smoking status: Never Smoker  . Smokeless tobacco: Never Used  Substance and Sexual Activity  . Alcohol use: No  . Drug use: No  . Sexual activity: Yes    Partners: Male    Birth control/protection: Pill  Lifestyle  . Physical activity:    Days per week: Not on file    Minutes per session: Not on file  . Stress: Not on file    Relationships  . Social connections:    Talks on phone: Not on file    Gets together: Not on file    Attends religious service: Not on file    Active member of club or organization: Not on file    Attends meetings of clubs or organizations: Not on file    Relationship status: Not on file  . Intimate partner violence:    Fear of current or ex partner: Not on file    Emotionally abused: Not on file    Physically abused: Not on file    Forced sexual activity: Not on file  Other Topics Concern  . Not on file  Social History Narrative   Patient lives at home  alone    Patient is right handed   Patient drinks soda's daily     PHYSICAL EXAM  There were no vitals filed for this visit. There is no height or weight on file to calculate BMI. General: well developed, obese female seated, in no evident distress  Head: head normocephalic and atraumatic. Oropharynx benign mallopatti5  Neck: supple  ,Circumference 19 Lungs clear Skin no peripheral edema Neurologic Exam  Mental Status: Awake and fully alert. Oriented to place and time. Follows all commands, speech and language are normal  Cranial Nerves: Pupils equal, briskly reactive to light. Extraocular movements full without nystagmus. Visual fields full to confrontation. Hearing intact and symmetric to finger snap. Facial sensation intact. Face, tongue, palate move normally and symmetrically. Neck flexion and extension normal.  Motor: Normal bulk and tone. Normal strength in all tested extremity muscles. Sensory.: intact to touch  Coordination: Rapid alternating movements normal in all extremities. Finger-to-nose and heel-to-shin performed accurately bilaterally.  Gait and Station: Arises from chair without difficulty. Stance is normal. Gait demonstrates normal stride length and balance . Able to heel, toe and tandem walk without difficulty.  DIAGNOSTIC DATA (LABS, IMAGING, TESTING)  ASSESSMENT AND PLAN  48 y.o. year old  female  has a past medical history of Hypertension; High cholesterol; Bipolar 1 disorder (HCC); Depression; Anxiety; Headache(784.0); PCOS (polycystic ovarian syndrome); here to follow up. Her headaches are in good  control  on Topamax. Sleep study confirmed obstructive sleep apnea. Data dated 12/07/2017-02/04/2018( 3 months) shows compliance greater than 4 hours at 35 days or 58%.  Usage days 75%.  Average usage 6 hours 39 minutes.  Set pressure 5 to 10 cm EPR level 3 AHI 2.7 ESS 6.   PLAN:Continue Topamax at current dose will refill  CPAP compliance 58% needs to be greater than 70  Continue same settings, please use the machine every night F/U in 4 months I spent  in total face to face time with the patient more than 50% of which was spent counseling and coordination of care, reviewing test results reviewing medications and discussing and reviewing the diagnosis of obstructive sleep apnea and the importance of being compliant. Reviewed compliance data with  her Nilda Riggs, [facility], [facility], APRN  [facility] [address] [place], [place] [address] [phone]

## 2018-10-03 ENCOUNTER — Ambulatory Visit: Payer: Medicare Other | Admitting: Nurse Practitioner

## 2018-10-31 ENCOUNTER — Telehealth: Payer: Self-pay | Admitting: Nurse Practitioner

## 2018-10-31 NOTE — Telephone Encounter (Signed)
Pt has called back to let RN Katrina know that she successfully got into software for upcoming appointment,no call back requested.

## 2018-11-06 ENCOUNTER — Other Ambulatory Visit: Payer: Self-pay

## 2018-11-06 ENCOUNTER — Ambulatory Visit (INDEPENDENT_AMBULATORY_CARE_PROVIDER_SITE_OTHER): Payer: Medicare Other | Admitting: Adult Health

## 2018-11-06 ENCOUNTER — Encounter: Payer: Self-pay | Admitting: Adult Health

## 2018-11-06 DIAGNOSIS — G43009 Migraine without aura, not intractable, without status migrainosus: Secondary | ICD-10-CM | POA: Diagnosis not present

## 2018-11-06 DIAGNOSIS — G4733 Obstructive sleep apnea (adult) (pediatric): Secondary | ICD-10-CM | POA: Diagnosis not present

## 2018-11-06 DIAGNOSIS — Z9989 Dependence on other enabling machines and devices: Secondary | ICD-10-CM | POA: Diagnosis not present

## 2018-11-06 NOTE — Progress Notes (Signed)
[facility]  PATIENT: Jamie FloridaMelinda K Merwin DOB: 06-17-1971   REASON FOR VISIT: episodic headache, morning headaches obstructive sleep apnea with CPAP   Virtual Visit via Video Note  I connected with Jamie [name] on 11/06/18 at  2:15 PM EDT by a video enabled telemedicine application located remotely in my own home and verified that I am speaking with the correct person using two identifiers who was located at their own home.   I discussed the limitations of evaluation and management by telemedicine and the availability of in person appointments. The patient expressed understanding and agreed to proceed.    HISTORY OF PRESENT ILLNESS:  Jamie [name] is a 48 year old female CPAP download from 10/07/2018 -11/05/2018 shows suboptimal compliance of 8 out of 30 usage days per 27% compliance rate.  Average usage 6 hours and 5 minutes with residual AHI 0.0.  Leaks in the 95th percentile 6.0 and pressure in the 95th percentile 10.  Set pressure 5 cm H2O -10 cm H2O with a EPR level 3.  States she has not been using machine in the past couple of months due to inflammation in her chest walls leading to difficulty breathing and pain in her chest and back.  She does feel as though this is starting to clear up and and plans on restarting use of CPAP machine. Headaches stable with topamax 50mg  BID with 1-2 headaches per month.  She continues to follow with behavioral health routinely for depression management.  No further concerns at this time.     REVIEW OF SYSTEMS: Full 14 system review of systems performed and notable only for those listed, all others are neg:  Constitutional: neg  Cardiovascular: neg Ear/Nose/Throat: neg  Skin: neg Eyes: neg Respiratory: neg Gastroitestinal: neg  Hematology/Lymphatic: neg  Endocrine: neg Musculoskeletal:neg Allergy/Immunology: neg Neurological: Occasional headache Psychiatric: Depression anxiety, bipolar disorder followed by psychiatry  Sleep : Apnea   ALLERGIES: Allergies  Allergen Reactions  . Codeine Hives and Swelling  . Morphine And Related Swelling and Rash    7/2: Pt state is OK in small doses and is willing to take Morphine this admission    HOME MEDICATIONS: Outpatient Medications Prior to Visit  Medication Sig Dispense Refill  . ARIPiprazole (ABILIFY) 10 MG tablet Take 1 tablet (10 mg total) by mouth daily. 30 tablet 5  . buPROPion (WELLBUTRIN XL) 150 MG 24 hr tablet Take 2 tablets (300 mg total) by mouth daily. 90 tablet 5  . clonazePAM (KLONOPIN) 1 MG tablet 1 qam 2  qhs 90 tablet 5  . DULoxetine (CYMBALTA) 60 MG capsule Take 2 capsules (120 mg total) by mouth daily. 180 capsule 7  . fluticasone (FLONASE) 50 MCG/ACT nasal spray Place 1 spray into both nostrils daily as needed for allergies or rhinitis.    [name] Kitchen. lisinopril (PRINIVIL,ZESTRIL) 5 MG tablet Take 5 mg by mouth daily.    . metformin (FORTAMET) 500 MG (OSM) 24 hr tablet Take 1,500 mg by mouth daily with breakfast.    . omeprazole (PRILOSEC) 20 MG capsule Take 1 capsule (20 mg total) by mouth daily. 30 capsule 0  . rosuvastatin (CRESTOR) 20 MG tablet Take 20 mg by mouth daily.      [name] Kitchen. topiramate (TOPAMAX) 50 MG tablet Take 1 tablet (50 mg total) by mouth 2 (two) times daily. 180 tablet 3  . traZODone (DESYREL) 100 MG tablet Take 4 tablets (400 mg total) by mouth at bedtime. 30 tablet 6  . TRULICITY 0.75 MG/0.5ML SOPN  No facility-administered medications prior to visit.     PAST MEDICAL HISTORY: Past Medical History:  Diagnosis Date  . Anxiety   . Bipolar 1 disorder (HCC)   . Complication of anesthesia   . Depression   . GERD (gastroesophageal reflux disease)   . H/O hiatal hernia   . Headache(784.0)   . High cholesterol   . Hypertension   . PCOS (polycystic ovarian syndrome)   . PONV (postoperative nausea and vomiting)    nausea  . Sleep apnea    CPAP    PAST SURGICAL HISTORY: Past Surgical History:  Procedure Laterality Date   . ANAL FISSURE REPAIR     x 3  . BACK SURGERY  2009  . CHOLECYSTECTOMY  07/2002  . KNEE ARTHROSCOPY Right   . LUMBAR LAMINECTOMY/DECOMPRESSION MICRODISCECTOMY Right 01/22/2014   Procedure: LUMBAR FOUR TO FIVE LUMBAR LAMINECTOMY/DECOMPRESSION MICRODISCECTOMY 1 LEVEL;  Surgeon: Karn Cassis, MD;  Location: [facility];  Service: Neurosurgery;  Laterality: Right;  Right L45 diskectomy    FAMILY HISTORY: Family History  Problem Relation Age of Onset  . Anxiety disorder Maternal Grandmother   . Depression Maternal Grandmother     SOCIAL HISTORY: Social History   Socioeconomic History  . Marital status: Married    Spouse name: Not on file  . Number of children: 0  . Years of education: college  . Highest education level: Not on file  Occupational History  . Occupation: disabled  Social Needs  . Financial resource strain: Not on file  . Food insecurity:    Worry: Not on file    Inability: Not on file  . Transportation needs:    Medical: Not on file    Non-medical: Not on file  Tobacco Use  . Smoking status: Never Smoker  . Smokeless tobacco: Never Used  Substance and Sexual Activity  . Alcohol use: No  . Drug use: No  . Sexual activity: Yes    Partners: Male    Birth control/protection: Pill  Lifestyle  . Physical activity:    Days per week: Not on file    Minutes per session: Not on file  . Stress: Not on file  Relationships  . Social connections:    Talks on phone: Not on file    Gets together: Not on file    Attends religious service: Not on file    Active member of club or organization: Not on file    Attends meetings of clubs or organizations: Not on file    Relationship status: Not on file  . Intimate partner violence:    Fear of current or ex partner: Not on file    Emotionally abused: Not on file    Physically abused: Not on file    Forced sexual activity: Not on file  Other Topics Concern  . Not on file  Social History Narrative   Patient lives  at home alone    Patient is right handed   Patient drinks soda's daily     PHYSICAL EXAM  *Limited exam due to visit type*  General: well developed, pleasant middle-aged obese Caucasian female seated, in no evident distress   Neurologic Exam  Mental Status: Awake and fully alert. Oriented to place and time. Follows all commands, speech and language are normal  Cranial Nerves:  Extraocular movements full without nystagmus.  Hearing intact and symmetric to finger snap. Face, tongue, palate move normally and symmetrically. Neck flexion and extension normal. Shoulder shrug symmetric. Motor: No evidence  of weakness project assessment Sensory.:UTA Coordination: Rapid alternating movements normal in all extremities. Finger-to-nose and heel-to-shin performed accurately bilaterally.  Gait and Station: Arises from chair without difficulty. Stance is normal. Gait demonstrates normal stride length and balance .      ASSESSMENT AND PLAN  48 y.o. year old female  has a past medical history of Hypertension; High cholesterol; Bipolar 1 disorder (HCC); Depression; Anxiety; Headache(784.0); PCOS (polycystic ovarian syndrome); here to follow up regarding headaches and OSA on CPAP management.  She has been noncompliant with CPAP over the past couple months due to patient reported chest inflammation and difficulty tolerating CPAP.  Headaches have been stable with ongoing use of Topamax.   PLAN:Continue Topamax 50 mg twice daily for headache management Highly encouraged restarting CPAP for OSA management for overall health benefits and needed compliance for insurance purposes No indication for setting changes at this time and advised patient to call office after 1 month of continuous use to pull download report to show adequate compliance when she restarts  Follow-up in 6 months with Aundra Millet, NP or call earlier if needed  Greater than 50% of this 15-minute visit was spent reviewing CPAP compliance  download and importance of adequate compliance and usage along with ongoing use of Topamax for headache management  George Hugh, AGNP-BC  [facility] [address] [place], [place] [address]  Phone [phone] Fax [phone] Note: This document was prepared with digital dictation and possible smart phrase technology. Any transcriptional errors that result from this process are unintentional.

## 2018-11-09 DIAGNOSIS — M5416 Radiculopathy, lumbar region: Secondary | ICD-10-CM | POA: Diagnosis not present

## 2018-11-28 DIAGNOSIS — M48062 Spinal stenosis, lumbar region with neurogenic claudication: Secondary | ICD-10-CM | POA: Diagnosis not present

## 2018-11-28 DIAGNOSIS — M5416 Radiculopathy, lumbar region: Secondary | ICD-10-CM | POA: Diagnosis not present

## 2018-12-14 ENCOUNTER — Other Ambulatory Visit: Payer: Self-pay

## 2018-12-14 NOTE — Patient Outreach (Signed)
[facility]) Care Management  12/14/2018  Jamie [name] 10-Jun-1971 [id number]   Medication Adherence call to Mr. [name] HIPPA Compliant Voice message left with a call back number. [name] is showing past due on Rosuvastatin 20 mg under [facility] Ins.   Lillia Abed CPhT Pharmacy Technician [facility] Management Direct Dial [phone]  Fax [phone] [email or web address]

## 2019-01-05 ENCOUNTER — Other Ambulatory Visit: Payer: Self-pay

## 2019-01-05 DIAGNOSIS — G4733 Obstructive sleep apnea (adult) (pediatric): Secondary | ICD-10-CM | POA: Diagnosis not present

## 2019-01-05 NOTE — Patient Outreach (Signed)
[facility]) Care Management  01/05/2019  Jamie [name] 1971-07-24 [id number]   Medication Adherence call to [name] Compliant Voice message left with a call back number. [name] is showing past due on Rosuvastatin 20 mg under [place].   [facility] Management Direct Dial [phone]  Fax [phone] [name]

## 2019-01-15 DIAGNOSIS — E119 Type 2 diabetes mellitus without complications: Secondary | ICD-10-CM | POA: Diagnosis not present

## 2019-01-15 DIAGNOSIS — K219 Gastro-esophageal reflux disease without esophagitis: Secondary | ICD-10-CM | POA: Diagnosis not present

## 2019-01-15 DIAGNOSIS — E782 Mixed hyperlipidemia: Secondary | ICD-10-CM | POA: Diagnosis not present

## 2019-01-15 DIAGNOSIS — E781 Pure hyperglyceridemia: Secondary | ICD-10-CM | POA: Diagnosis not present

## 2019-01-15 DIAGNOSIS — I10 Essential (primary) hypertension: Secondary | ICD-10-CM | POA: Diagnosis not present

## 2019-01-16 DIAGNOSIS — Z1389 Encounter for screening for other disorder: Secondary | ICD-10-CM | POA: Diagnosis not present

## 2019-01-16 DIAGNOSIS — I10 Essential (primary) hypertension: Secondary | ICD-10-CM | POA: Diagnosis not present

## 2019-01-16 DIAGNOSIS — E781 Pure hyperglyceridemia: Secondary | ICD-10-CM | POA: Diagnosis not present

## 2019-01-16 DIAGNOSIS — K219 Gastro-esophageal reflux disease without esophagitis: Secondary | ICD-10-CM | POA: Diagnosis not present

## 2019-02-07 ENCOUNTER — Other Ambulatory Visit: Payer: Self-pay

## 2019-02-07 ENCOUNTER — Ambulatory Visit (INDEPENDENT_AMBULATORY_CARE_PROVIDER_SITE_OTHER): Payer: Medicare Other | Admitting: Psychiatry

## 2019-02-07 DIAGNOSIS — F333 Major depressive disorder, recurrent, severe with psychotic symptoms: Secondary | ICD-10-CM

## 2019-02-07 DIAGNOSIS — F323 Major depressive disorder, single episode, severe with psychotic features: Secondary | ICD-10-CM | POA: Diagnosis not present

## 2019-02-07 MED ORDER — CLONAZEPAM 1 MG PO TABS: ORAL_TABLET | ORAL | 5 refills | Status: DC

## 2019-02-07 MED ORDER — ARIPIPRAZOLE 10 MG PO TABS: 10.0000 mg | ORAL_TABLET | Freq: Every day | ORAL | 5 refills | Status: DC

## 2019-02-07 MED ORDER — TRAZODONE HCL 100 MG PO TABS: 400.0000 mg | ORAL_TABLET | Freq: Every day | ORAL | 6 refills | Status: DC

## 2019-02-07 MED ORDER — BUPROPION HCL ER (XL) 150 MG PO TB24: ORAL_TABLET | ORAL | 5 refills | Status: DC

## 2019-02-07 NOTE — Progress Notes (Signed)
Patient ID: Jamie FloridaMelinda K [name], female   DOB: [birth year], 48 y.o.   MR[facility] [id number] [facility]  02/07/2019 3:32 PM Jamie FloridaMelinda K [name]  MRN:  [id number] Subjective:  Feeling well. Principal Problem: Major depression recurrent  Today the patient is not doing well.  She describes an increase sense of depression since she put her dog down 3 weeks ago.  Previous to this she was doing fairly well.  The patient is not suicidal as she has been in the past.  She knows when to ask for help but she says she is actually not interested in ending her life.  She is eating well and she is got fairly good energy.  She is close to her husband.  He knows what is going on.  The patient was in therapy but she has not seen her therapist in a while.  Today she agreed to call her therapist and get an appointment.  The patient is having somewhat of a reduction in her ability to enjoy things.  She still has other animals and she enjoys them.  But overall she feels badly about herself.  She hears voices actually every day and sometimes they say bad things about her.  She says she is experienced this many times in the past and she knows to ignore them.  She seems to be pretty experienced about hearing voices.  Patient drinks no alcohol uses no drugs.  She is functioning actually fairly well.  Interestingly the patient recently just adopted a new puppy.  I think she is positive about the future.  She is not letting the virus get to her.  She says it really has not changed her lifestyle very much.  Past Medical History:  Diagnosis Date  . Anxiety   . Bipolar 1 disorder (HCC)   . Complication of anesthesia   . Depression   . GERD (gastroesophageal reflux disease)   . H/O hiatal hernia   . Headache(784.0)   . High cholesterol   . Hypertension   . PCOS (polycystic ovarian syndrome)   . PONV (postoperative nausea and vomiting)    nausea  . Sleep apnea    CPAP    Past Surgical History:  Procedure Laterality Date  .  ANAL FISSURE REPAIR     x 3  . BACK SURGERY  2009  . CHOLECYSTECTOMY  07/2002  . KNEE ARTHROSCOPY Right   . LUMBAR LAMINECTOMY/DECOMPRESSION MICRODISCECTOMY Right 01/22/2014   Procedure: LUMBAR FOUR TO FIVE LUMBAR LAMINECTOMY/DECOMPRESSION MICRODISCECTOMY 1 LEVEL;  Surgeon: Karn CassisErnesto M Botero, MD;  Location: [facility];  Service: Neurosurgery;  Laterality: Right;  Right L45 diskectomy   Family History:  Family History  Problem Relation Age of Onset  . Anxiety disorder Maternal Grandmother   . Depression Maternal Grandmother    Family Psychiatric  History:  Social History:  Social History   Substance and Sexual Activity  Alcohol Use No     Social History   Substance and Sexual Activity  Drug Use No    Social History   Socioeconomic History  . Marital status: Married    Spouse name: Not on file  . Number of children: 0  . Years of education: college  . Highest education level: Not on file  Occupational History  . Occupation: disabled  Social Needs  . Financial resource strain: Not on file  . Food insecurity    Worry: Not on file    Inability: Not on file  . Transportation needs  Medical: Not on file    Non-medical: Not on file  Tobacco Use  . Smoking status: Never Smoker  . Smokeless tobacco: Never Used  Substance and Sexual Activity  . Alcohol use: No  . Drug use: No  . Sexual activity: Yes    Partners: Male    Birth control/protection: Pill  Lifestyle  . Physical activity    Days per week: Not on file    Minutes per session: Not on file  . Stress: Not on file  Relationships  . Social Musicianconnections    Talks on phone: Not on file    Gets together: Not on file    Attends religious service: Not on file    Active member of club or organization: Not on file    Attends meetings of clubs or organizations: Not on file    Relationship status: Not on file  Other Topics Concern  . Not on file  Social History Narrative   Patient lives at home alone    Patient  is right handed   Patient drinks soda's daily   Additional Social History:                         Sleep: Fair  Appetite:  Good  Current Medications: Current Outpatient Medications  Medication Sig Dispense Refill  . ARIPiprazole (ABILIFY) 10 MG tablet Take 1 tablet (10 mg total) by mouth daily. 30 tablet 5  . buPROPion (WELLBUTRIN XL) 150 MG 24 hr tablet 3  qam 90 tablet 5  . clonazePAM (KLONOPIN) 1 MG tablet 1 qam 2  qhs 90 tablet 5  . DULoxetine (CYMBALTA) 60 MG capsule Take 2 capsules (120 mg total) by mouth daily. 180 capsule 7  . fluticasone (FLONASE) 50 MCG/ACT nasal spray Place 1 spray into both nostrils daily as needed for allergies or rhinitis.    [name] Kitchen. lisinopril (PRINIVIL,ZESTRIL) 5 MG tablet Take 5 mg by mouth daily.    . metformin (FORTAMET) 500 MG (OSM) 24 hr tablet Take 1,500 mg by mouth daily with breakfast.    . omeprazole (PRILOSEC) 20 MG capsule Take 1 capsule (20 mg total) by mouth daily. 30 capsule 0  . rosuvastatin (CRESTOR) 20 MG tablet Take 20 mg by mouth daily.      [name] Kitchen. topiramate (TOPAMAX) 50 MG tablet Take 1 tablet (50 mg total) by mouth 2 (two) times daily. 180 tablet 3  . traZODone (DESYREL) 100 MG tablet Take 4 tablets (400 mg total) by mouth at bedtime. 30 tablet 6  . TRULICITY 0.75 MG/0.5ML SOPN      No current facility-administered medications for this visit.     Lab Results: No results found for this or any previous visit (from the past 48 hour(s)).  Physical Findings: AIMS:  , ,  ,  ,    CIWA:    COWS:     Musculoskeletal: Strength & Muscle Tone: within normal limits Gait & Station: normal Patient leans: Right  Psychiatric Specialty Exam: ROS  There were no vitals taken for this visit.There is no height or weight on file to calculate BMI.  General Appearance: Casual  Eye Contact::  Good  Speech:  Clear and Coherent  Volume:  Normal  Mood:Depressed  Affect:  Appropriate  Today the patient is doing very well. Thought Process:   Coherent  Orientation:  Full (Time, Place, and Person)  Thought Content:  NA and WDL  Suicidal Thoughts:  yes  Homicidal Thoughts:  No  Memory:  Immediate;   NA  Judgement:  Good  Insight:  Good  Psychomotor Activity:  NA  Concentration:  Good  Recall:  Good  Fund of Knowledge:Good  Language: Good  Akathisia:  No  Handed:  Right  AIMS (if indicated):     Assets:  Desire for Improvement  ADL's:  Intact  Cognition: WNL  Sleep:      Treatment Plan Summary: 02/07/2019, 3:32 PM  This patient's first problem is that of major clinical depression.  She is been changed to Abilify 10 mg which I think has been helpful.  She is taking 300 mg of Wellbutrin which today were going to change.  We are going to increase it to the maximum dose of 450 mg.  Should continue taking 120 mg of Cymbalta.  Should continue taking Klonopin as it is prescribed.  Generally she claims she is sleeping well.  She really does not have that much anxiety.  She has some suicidal considerations but she has no true intent to act.  She has no plan and she wants to get better.  She is agreed to call her therapist and to increase her Wellbutrin as described above.  I do not think this patient is acutely suicidal.  I do think she is showing signs of increasing depression which may in fact be sparked by the death of her dog.  I think it is important that she got a new dog and she feels good about this.  Her husband is very supportive and available able to work.  Patient is agreed to make these recommendations to call her therapist to increase her antidepressant and return to see me either by phone or in person in 5 weeks.

## 2019-02-15 DIAGNOSIS — M7070 Other bursitis of hip, unspecified hip: Secondary | ICD-10-CM | POA: Diagnosis not present

## 2019-02-20 DIAGNOSIS — M25552 Pain in left hip: Secondary | ICD-10-CM | POA: Diagnosis not present

## 2019-03-01 DIAGNOSIS — M25552 Pain in left hip: Secondary | ICD-10-CM | POA: Diagnosis not present

## 2019-03-11 ENCOUNTER — Other Ambulatory Visit (HOSPITAL_COMMUNITY): Payer: Self-pay | Admitting: Psychiatry

## 2019-03-11 DIAGNOSIS — F323 Major depressive disorder, single episode, severe with psychotic features: Secondary | ICD-10-CM

## 2019-03-12 ENCOUNTER — Other Ambulatory Visit: Payer: Self-pay | Admitting: Orthopedic Surgery

## 2019-03-12 DIAGNOSIS — M545 Low back pain, unspecified: Secondary | ICD-10-CM

## 2019-03-12 DIAGNOSIS — M25552 Pain in left hip: Secondary | ICD-10-CM

## 2019-03-28 ENCOUNTER — Ambulatory Visit (INDEPENDENT_AMBULATORY_CARE_PROVIDER_SITE_OTHER): Payer: Medicare Other | Admitting: Psychiatry

## 2019-03-28 ENCOUNTER — Other Ambulatory Visit: Payer: Self-pay

## 2019-03-28 DIAGNOSIS — F323 Major depressive disorder, single episode, severe with psychotic features: Secondary | ICD-10-CM | POA: Diagnosis not present

## 2019-03-28 DIAGNOSIS — F324 Major depressive disorder, single episode, in partial remission: Secondary | ICD-10-CM

## 2019-03-28 MED ORDER — ARIPIPRAZOLE 10 MG PO TABS: 10.0000 mg | ORAL_TABLET | Freq: Every day | ORAL | 5 refills | Status: DC

## 2019-03-28 MED ORDER — BUPROPION HCL ER (XL) 150 MG PO TB24: ORAL_TABLET | ORAL | 5 refills | Status: DC

## 2019-03-28 MED ORDER — DULOXETINE HCL 60 MG PO CPEP: 120.0000 mg | ORAL_CAPSULE | Freq: Every day | ORAL | 7 refills | Status: DC

## 2019-03-28 MED ORDER — CLONAZEPAM 1 MG PO TABS: ORAL_TABLET | ORAL | 5 refills | Status: DC

## 2019-03-28 NOTE — Progress Notes (Signed)
Patient ID: Jamie [name], female   DOB: [birth year], 48 y.o.   MRN: [id number] [facility] MD Progress Note  03/28/2019 4:36 PM REFUGIO MCCONICO  MRN:  [id number] Subjective:  Feeling well. Principal Problem: Major depression recurrent  This patient has been treated here for major depression with psychosis.  Her last hospitalization was in October 2019 when she was admitted for depression and suicidal ideation from our office.  Since then the patient is doing fairly well.  Her last visit she had somewhat of a decline following the death of her dog.  Today she is doing better.  They have adopted a new dog and the dog is doing well the patient feels better.  She is starting to enjoy life more and more.  The patient's biggest stress likely is the fact that she is living in over problem environment.  There are 8 people living in her home.  This includes someone who just gotten out of prison.  The patient can get away from the mall and spend time alone which she does.  Generally she is sleeping and eating fairly well.  She is back to enjoying life.  The increase of Wellbutrin to a dose of 450 mg has been helpful.  Her husband is back to working part-time.  The patient continues in therapy.  This month she is going to have an MRI of her back.  She is chronic back pain.  Other than this her health is fairly good.  She denies shortness of breath or chest pain.  She denies any specific neurological symptoms other than back pain.  The patient denies use of drugs or alcohol.  Overall she is actually quite stable and functioning well.  She clearly has improved.  Past Medical History:  Diagnosis Date  . Anxiety   . Bipolar 1 disorder ([place])   . Complication of anesthesia   . Depression   . GERD (gastroesophageal reflux disease)   . H/O hiatal hernia   . Headache(784.0)   . High cholesterol   . Hypertension   . PCOS (polycystic ovarian syndrome)   . PONV (postoperative nausea and vomiting)    nausea  . Sleep apnea     CPAP    Past Surgical History:  Procedure Laterality Date  . ANAL FISSURE REPAIR     x 3  . BACK SURGERY  2009  . CHOLECYSTECTOMY  07/2002  . KNEE ARTHROSCOPY Right   . LUMBAR LAMINECTOMY/DECOMPRESSION MICRODISCECTOMY Right 01/22/2014   Procedure: LUMBAR FOUR TO FIVE LUMBAR LAMINECTOMY/DECOMPRESSION MICRODISCECTOMY 1 LEVEL;  Surgeon: Floyce Stakes, MD;  Location: [place] [facility];  Service: Neurosurgery;  Laterality: Right;  Right L45 diskectomy   Family History:  Family History  Problem Relation Age of Onset  . Anxiety disorder Maternal Grandmother   . Depression Maternal Grandmother    Family Psychiatric  History:  Social History:  Social History   Substance and Sexual Activity  Alcohol Use No     Social History   Substance and Sexual Activity  Drug Use No    Social History   Socioeconomic History  . Marital status: Married    Spouse name: Not on file  . Number of children: 0  . Years of education: college  . Highest education level: Not on file  Occupational History  . Occupation: disabled  Social Needs  . Financial resource strain: Not on file  . Food insecurity    Worry: Not on file    Inability: Not on file  .  Transportation needs    Medical: Not on file    Non-medical: Not on file  Tobacco Use  . Smoking status: Never Smoker  . Smokeless tobacco: Never Used  Substance and Sexual Activity  . Alcohol use: No  . Drug use: No  . Sexual activity: Yes    Partners: Male    Birth control/protection: Pill  Lifestyle  . Physical activity    Days per week: Not on file    Minutes per session: Not on file  . Stress: Not on file  Relationships  . Social Musicianconnections    Talks on phone: Not on file    Gets together: Not on file    Attends religious service: Not on file    Active member of club or organization: Not on file    Attends meetings of clubs or organizations: Not on file    Relationship status: Not on file  Other Topics Concern  . Not on file   Social History Narrative   Patient lives at home alone    Patient is right handed   Patient drinks soda's daily   Additional Social History:                         Sleep: Fair  Appetite:  Good  Current Medications: Current Outpatient Medications  Medication Sig Dispense Refill  . ARIPiprazole (ABILIFY) 10 MG tablet Take 1 tablet (10 mg total) by mouth daily. 30 tablet 5  . buPROPion (WELLBUTRIN XL) 150 MG 24 hr tablet 3  qam 90 tablet 5  . clonazePAM (KLONOPIN) 1 MG tablet 1 qam 2  qhs 90 tablet 5  . DULoxetine (CYMBALTA) 60 MG capsule Take 2 capsules (120 mg total) by mouth daily. 180 capsule 7  . fluticasone (FLONASE) 50 MCG/ACT nasal spray Place 1 spray into both nostrils daily as needed for allergies or rhinitis.    [name] Kitchen. lisinopril (PRINIVIL,ZESTRIL) 5 MG tablet Take 5 mg by mouth daily.    . metformin (FORTAMET) 500 MG (OSM) 24 hr tablet Take 1,500 mg by mouth daily with breakfast.    . omeprazole (PRILOSEC) 20 MG capsule Take 1 capsule (20 mg total) by mouth daily. 30 capsule 0  . rosuvastatin (CRESTOR) 20 MG tablet Take 20 mg by mouth daily.      [name] Kitchen. topiramate (TOPAMAX) 50 MG tablet Take 1 tablet (50 mg total) by mouth 2 (two) times daily. 180 tablet 3  . traZODone (DESYREL) 100 MG tablet Take 4 tablets (400 mg total) by mouth at bedtime. 30 tablet 6  . TRULICITY 0.75 MG/0.5ML SOPN      No current facility-administered medications for this visit.     Lab Results: No results found for this or any previous visit (from the past 48 hour(s)).  Physical Findings: AIMS:  , ,  ,  ,    CIWA:    COWS:     Musculoskeletal: Strength & Muscle Tone: within normal limits Gait & Station: normal Patient leans: Right  Psychiatric Specialty Exam: ROS  There were no vitals taken for this visit.There is no height or weight on file to calculate BMI.  General Appearance: Casual  Eye Contact::  Good  Speech:  Clear and Coherent  Volume:  Normal  Mood:Depressed  Affect:   Appropriate  Today the patient is doing very well. Thought Process:  Coherent  Orientation:  Full (Time, Place, and Person)  Thought Content:  NA and WDL  Suicidal Thoughts:  yes  Homicidal Thoughts:  No  Memory:  Immediate;   NA  Judgement:  Good  Insight:  Good  Psychomotor Activity:  NA  Concentration:  Good  Recall:  Good  Fund of Knowledge:Good  Language: Good  Akathisia:  No  Handed:  Right  AIMS (if indicated):     Assets:  Desire for Improvement  ADL's:  Intact  Cognition: WNL  Sleep:      Treatment Plan Summary: 03/28/2019, 4:36 PM  This patient's first problem is that of major depression.  She is treated with 450 mg of Wellbutrin, Cymbalta 120 mg and Abilify 10 mg.  She was started on the Abilify when she was last hospitalized in October 2019.  Ultimately we will attempt to reduce it in the coming year.  Her second problem is that of insomnia.  The patient takes Klonopin 1 mg in the morning and 2 mg at night.  With a combination of Abilify and the rest of the psychotropic medicines the patient at this time is stable.  She actually sees a therapist by the name of Jasmine December in a local center.  She will continue in therapy.  Today we had a discussion about the importance of not needing to go to the hospital.  We talked about the importance of coming in early when she started to feel depressed.  The patient has been psychiatrically hospitalized 8 times in her life.  We will do everything we can to prevent any future hospitalizations.  This patient will be seen again in 3 months.

## 2019-04-06 DIAGNOSIS — J020 Streptococcal pharyngitis: Secondary | ICD-10-CM | POA: Diagnosis not present

## 2019-04-06 DIAGNOSIS — H109 Unspecified conjunctivitis: Secondary | ICD-10-CM | POA: Diagnosis not present

## 2019-04-10 ENCOUNTER — Other Ambulatory Visit (HOSPITAL_COMMUNITY): Payer: Self-pay

## 2019-04-10 MED ORDER — TRAZODONE HCL 100 MG PO TABS: 400.0000 mg | ORAL_TABLET | Freq: Every day | ORAL | 4 refills | Status: DC

## 2019-04-11 ENCOUNTER — Ambulatory Visit
Admission: RE | Admit: 2019-04-11 | Discharge: 2019-04-11 | Disposition: A | Discharge status: Home / Self Care | Payer: Medicare Other | Source: Ambulatory Visit | Attending: Orthopedic Surgery | Admitting: Orthopedic Surgery

## 2019-04-11 ENCOUNTER — Other Ambulatory Visit: Payer: Self-pay

## 2019-04-11 DIAGNOSIS — M1612 Unilateral primary osteoarthritis, left hip: Secondary | ICD-10-CM | POA: Diagnosis not present

## 2019-04-11 DIAGNOSIS — M545 Low back pain, unspecified: Secondary | ICD-10-CM

## 2019-04-11 DIAGNOSIS — M25552 Pain in left hip: Secondary | ICD-10-CM

## 2019-04-11 DIAGNOSIS — M48061 Spinal stenosis, lumbar region without neurogenic claudication: Secondary | ICD-10-CM | POA: Diagnosis not present

## 2019-04-23 DIAGNOSIS — M25552 Pain in left hip: Secondary | ICD-10-CM | POA: Diagnosis not present

## 2019-05-03 ENCOUNTER — Telehealth: Payer: Self-pay | Admitting: Adult Health

## 2019-05-03 DIAGNOSIS — M545 Low back pain: Secondary | ICD-10-CM | POA: Diagnosis not present

## 2019-05-03 DIAGNOSIS — M5416 Radiculopathy, lumbar region: Secondary | ICD-10-CM | POA: Diagnosis not present

## 2019-05-03 DIAGNOSIS — I10 Essential (primary) hypertension: Secondary | ICD-10-CM | POA: Diagnosis not present

## 2019-05-03 NOTE — Telephone Encounter (Signed)
Pt is asking for a call re: a referral to a neurosurgeon

## 2019-05-03 NOTE — Telephone Encounter (Signed)
I called pt and she is having some workup done on her back/ hip.  She wanted [place] to refer her to NS.  I told her that her pcp, or ortho who have done initial work up on her would need to refer her to NS as they have what they have done (testing and exams) to send to NS.  She verbalized understanding.  I relayed that we see her for cpap, and migraines.  She understood and will call Dr. French Ana back.

## 2019-05-10 ENCOUNTER — Ambulatory Visit: Payer: Medicare Other | Admitting: Adult Health

## 2019-05-10 DIAGNOSIS — E782 Mixed hyperlipidemia: Secondary | ICD-10-CM | POA: Diagnosis not present

## 2019-05-10 DIAGNOSIS — R7301 Impaired fasting glucose: Secondary | ICD-10-CM | POA: Diagnosis not present

## 2019-05-10 DIAGNOSIS — I10 Essential (primary) hypertension: Secondary | ICD-10-CM | POA: Diagnosis not present

## 2019-05-10 DIAGNOSIS — E781 Pure hyperglyceridemia: Secondary | ICD-10-CM | POA: Diagnosis not present

## 2019-05-10 DIAGNOSIS — E119 Type 2 diabetes mellitus without complications: Secondary | ICD-10-CM | POA: Diagnosis not present

## 2019-05-15 DIAGNOSIS — Z23 Encounter for immunization: Secondary | ICD-10-CM | POA: Diagnosis not present

## 2019-05-15 DIAGNOSIS — E119 Type 2 diabetes mellitus without complications: Secondary | ICD-10-CM | POA: Diagnosis not present

## 2019-05-15 DIAGNOSIS — Z0000 Encounter for general adult medical examination without abnormal findings: Secondary | ICD-10-CM | POA: Diagnosis not present

## 2019-05-21 DIAGNOSIS — R197 Diarrhea, unspecified: Secondary | ICD-10-CM | POA: Diagnosis not present

## 2019-05-25 DIAGNOSIS — I10 Essential (primary) hypertension: Secondary | ICD-10-CM | POA: Diagnosis not present

## 2019-05-25 DIAGNOSIS — E782 Mixed hyperlipidemia: Secondary | ICD-10-CM | POA: Diagnosis not present

## 2019-05-30 ENCOUNTER — Encounter: Payer: Self-pay | Admitting: Internal Medicine

## 2019-05-30 DIAGNOSIS — M542 Cervicalgia: Secondary | ICD-10-CM | POA: Diagnosis not present

## 2019-05-30 DIAGNOSIS — Z79899 Other long term (current) drug therapy: Secondary | ICD-10-CM | POA: Diagnosis not present

## 2019-05-30 DIAGNOSIS — E7800 Pure hypercholesterolemia, unspecified: Secondary | ICD-10-CM | POA: Diagnosis not present

## 2019-05-30 DIAGNOSIS — R519 Headache, unspecified: Secondary | ICD-10-CM | POA: Diagnosis not present

## 2019-05-30 DIAGNOSIS — Z885 Allergy status to narcotic agent status: Secondary | ICD-10-CM | POA: Diagnosis not present

## 2019-05-30 DIAGNOSIS — E119 Type 2 diabetes mellitus without complications: Secondary | ICD-10-CM | POA: Diagnosis not present

## 2019-05-30 DIAGNOSIS — I10 Essential (primary) hypertension: Secondary | ICD-10-CM | POA: Diagnosis not present

## 2019-05-30 DIAGNOSIS — S0990XA Unspecified injury of head, initial encounter: Secondary | ICD-10-CM | POA: Diagnosis not present

## 2019-05-30 DIAGNOSIS — S80211A Abrasion, right knee, initial encounter: Secondary | ICD-10-CM | POA: Diagnosis not present

## 2019-05-30 DIAGNOSIS — S161XXA Strain of muscle, fascia and tendon at neck level, initial encounter: Secondary | ICD-10-CM | POA: Diagnosis not present

## 2019-05-30 DIAGNOSIS — S199XXA Unspecified injury of neck, initial encounter: Secondary | ICD-10-CM | POA: Diagnosis not present

## 2019-05-30 DIAGNOSIS — Z7984 Long term (current) use of oral hypoglycemic drugs: Secondary | ICD-10-CM | POA: Diagnosis not present

## 2019-05-30 DIAGNOSIS — S80212A Abrasion, left knee, initial encounter: Secondary | ICD-10-CM | POA: Diagnosis not present

## 2019-06-12 ENCOUNTER — Other Ambulatory Visit: Payer: Self-pay

## 2019-06-12 ENCOUNTER — Encounter: Payer: Self-pay | Admitting: *Deleted

## 2019-06-12 ENCOUNTER — Other Ambulatory Visit: Payer: Self-pay | Admitting: *Deleted

## 2019-06-12 ENCOUNTER — Ambulatory Visit: Payer: Medicare Other | Admitting: Gastroenterology

## 2019-06-12 ENCOUNTER — Encounter: Payer: Self-pay | Admitting: Gastroenterology

## 2019-06-12 DIAGNOSIS — K529 Noninfective gastroenteritis and colitis, unspecified: Secondary | ICD-10-CM

## 2019-06-12 MED ORDER — PANTOPRAZOLE SODIUM 40 MG PO TBEC: 40.0000 mg | DELAYED_RELEASE_TABLET | Freq: Every day | ORAL | 3 refills | Status: DC

## 2019-06-12 MED ORDER — ONDANSETRON 4 MG PO TBDP: 4.0000 mg | ORAL_TABLET | Freq: Three times a day (TID) | ORAL | 3 refills | Status: DC | PRN

## 2019-06-12 MED ORDER — DICYCLOMINE HCL 10 MG PO CAPS: 10.0000 mg | ORAL_CAPSULE | Freq: Three times a day (TID) | ORAL | 3 refills | Status: DC

## 2019-06-12 MED ORDER — PEG 3350-KCL-NA BICARB-NACL 420 G PO SOLR: ORAL | 0 refills | Status: DC

## 2019-06-12 NOTE — Patient Instructions (Signed)
Let's stop omeprazole. Start Protonix once each morning, 30 minutes before breakfast.  I sent in Zofran for nausea to take every 8 hours.  For diarrhea and abdominal cramping: take Bentyl once 30 minutes before meals and at bedtime. No more than 4 times per day. This can cause drowsiness, dry mouth, constipation.  We are arranging a colonoscopy in the near future with Dr. Gala Romney.  If you have worsening pain, please let me know, and we will need to do a CT.  It was a pleasure to see you today. I want to create trusting relationships with patients to provide genuine, compassionate, and quality care. I value your feedback. If you receive a survey regarding your visit,  I greatly appreciate you taking time to fill this out.   Annitta Needs, PhD, ANP-BC [facility]

## 2019-06-12 NOTE — Progress Notes (Signed)
Primary Care Physician:  Joeseph Amor Primary Gastroenterologist:  Dr. [name]  Chief Complaint  Patient presents with  . Nausea    x6 months  . Diarrhea    HPI:   Jamie [name] is a 48 y.o. female presenting today at the request of Roxine Caddy, [place], due to nausea and diarrhea.    Notes diarrhea for 6 months. 10-12 watery stools per day. Prior to this would have soft BM daily. Acute onset 6 months ago. No fever/chills. Felt nauseated at that time. Thought had a stomach bug. Significant abdominal discomfort, sharp. Located in epigastric region. Pain is intermittent. Tylenol will help relieve pain. Not associated with eating/drinking. No rectal bleeding. Pain worsened postprandially. Nocturnal episodes, fecal incontinence at night. Sometimes greasy looking. No formed stool. Nothing relieves diarrhea. Nausea daily. Lasts all day. Only saltine crackers and sugar-free gatorade. Stool studies including culture, O&P, and Cdiff were negative in Oct 2020.   +GERD: omeprazole started many years ago.  EGD/colonoscopy in 2004 at [facility]. Dr. Lovell Sheehan. Not available in epic. Has been on metformin for many years. No recent medication additions. Stopped Trulicity without improvement. topamax chronically for headaches. Rare NSAIDs.   Nausea for 16 years. No prior GES. Present since prior to cholecystectomy. Lost 10 lbs over past 6 months. Sometimes can eat, sometimes not.   Past Medical History:  Diagnosis Date  . Anxiety   . Bipolar 1 disorder (HCC)   . Complication of anesthesia   . Depression   . Diabetes (HCC)   . GERD (gastroesophageal reflux disease)   . H/O hiatal hernia   . Headache(784.0)   . High cholesterol   . Hypertension   . PCOS (polycystic ovarian syndrome)   . PONV (postoperative nausea and vomiting)    nausea  . Sleep apnea    CPAP    Past Surgical History:  Procedure Laterality Date  . ANAL FISSURE REPAIR     x 3  . BACK SURGERY  2009  . CHOLECYSTECTOMY   07/2002  . KNEE ARTHROSCOPY Right   . LUMBAR LAMINECTOMY/DECOMPRESSION MICRODISCECTOMY Right 01/22/2014   Procedure: LUMBAR FOUR TO FIVE LUMBAR LAMINECTOMY/DECOMPRESSION MICRODISCECTOMY 1 LEVEL;  Surgeon: Karn Cassis, MD;  Location: [facility];  Service: Neurosurgery;  Laterality: Right;  Right L45 diskectomy    Current Outpatient Medications  Medication Sig Dispense Refill  . ARIPiprazole (ABILIFY) 10 MG tablet Take 1 tablet (10 mg total) by mouth daily. 30 tablet 5  . buPROPion (WELLBUTRIN) 100 MG tablet Take 300 mg by mouth daily.    . clonazePAM (KLONOPIN) 1 MG tablet 1 qam 2  qhs 90 tablet 5  . DULoxetine (CYMBALTA) 60 MG capsule Take 2 capsules (120 mg total) by mouth daily. 180 capsule 7  . fluticasone (FLONASE) 50 MCG/ACT nasal spray Place 1 spray into both nostrils daily as needed for allergies or rhinitis.    [name] Kitchen lisinopril (PRINIVIL,ZESTRIL) 5 MG tablet Take 5 mg by mouth daily.    . metformin (FORTAMET) 500 MG (OSM) 24 hr tablet Take 2,000 mg by mouth daily with breakfast.     . omeprazole (PRILOSEC) 20 MG capsule Take 1 capsule (20 mg total) by mouth daily. 30 capsule 0  . rosuvastatin (CRESTOR) 20 MG tablet Take 20 mg by mouth daily.      [name] Kitchen topiramate (TOPAMAX) 50 MG tablet Take 1 tablet (50 mg total) by mouth 2 (two) times daily. 180 tablet 3  . traZODone (DESYREL) 100 MG tablet Take  4 tablets (400 mg total) by mouth at bedtime. 120 tablet 4  . dicyclomine (BENTYL) 10 MG capsule Take 1 capsule (10 mg total) by mouth 4 (four) times daily -  before meals and at bedtime. 120 capsule 3  . ondansetron (ZOFRAN ODT) 4 MG disintegrating tablet Take 1 tablet (4 mg total) by mouth every 8 (eight) hours as needed for nausea or vomiting. 40 tablet 3  . pantoprazole (PROTONIX) 40 MG tablet Take 1 tablet (40 mg total) by mouth daily. Take 30 minutes before breakfast 90 tablet 3  . polyethylene glycol-electrolytes (NULYTELY/GOLYTELY) 420 g solution As directed 4000 mL 0   No current  facility-administered medications for this visit.     Allergies as of 06/12/2019 - Review Complete 06/12/2019  Allergen Reaction Noted  . Codeine Hives and Swelling 06/07/2011  . Morphine and related Swelling and Rash 01/21/2014    Family History  Problem Relation Age of Onset  . Anxiety disorder Maternal Grandmother   . Depression Maternal Grandmother   . Colon cancer Neg Hx   . Colon polyps Neg Hx     Social History   Socioeconomic History  . Marital status: Married    Spouse name: Not on file  . Number of children: 0  . Years of education: college  . Highest education level: Not on file  Occupational History  . Occupation: disabled  Social Needs  . Financial resource strain: Not on file  . Food insecurity    Worry: Not on file    Inability: Not on file  . Transportation needs    Medical: Not on file    Non-medical: Not on file  Tobacco Use  . Smoking status: Never Smoker  . Smokeless tobacco: Never Used  Substance and Sexual Activity  . Alcohol use: No  . Drug use: No  . Sexual activity: Yes    Partners: Male    Birth control/protection: Pill  Lifestyle  . Physical activity    Days per week: Not on file    Minutes per session: Not on file  . Stress: Not on file  Relationships  . Social Herbalist on phone: Not on file    Gets together: Not on file    Attends religious service: Not on file    Active member of club or organization: Not on file    Attends meetings of clubs or organizations: Not on file    Relationship status: Not on file  . Intimate partner violence    Fear of current or ex partner: Not on file    Emotionally abused: Not on file    Physically abused: Not on file    Forced sexual activity: Not on file  Other Topics Concern  . Not on file  Social History Narrative   Patient lives at home alone    Patient is right handed   Patient drinks soda's daily    Review of Systems: Gen: see HPI  CV: Denies chest pain, heart  palpitations, peripheral edema, syncope.  Resp: Denies shortness of breath at rest or with exertion. Denies wheezing or cough.  GI: see HPI GU : Denies urinary burning, urinary frequency, urinary hesitancy MS: Denies joint pain, muscle weakness, cramps, or limitation of movement.  Derm: Denies rash, itching, dry skin Psych: Denies depression, anxiety, memory loss, and confusion Heme: Denies bruising, bleeding, and enlarged lymph nodes.  Physical Exam: BP 136/77   Pulse (!) 103   Temp (!) 96.9 F (36.1 C) (Temporal)  Ht 5\' 3"  (1.6 m)   Wt 253 lb 3.2 oz (114.9 kg)   BMI 44.85 kg/m  General:   Alert and oriented. Pleasant and cooperative. Well-nourished and well-developed.  Head:  Normocephalic and atraumatic. Eyes:  Without icterus, sclera clear and conjunctiva pink.  Ears:  Normal auditory acuity. Lungs:  Clear to auscultation bilaterally. No wheezes, rales, or rhonchi. No distress.  Heart:  S1, S2 present without murmurs appreciated.  Abdomen:  +BS, soft, non-tender and non-distended. No HSM noted. No guarding or rebound. No masses appreciated. Obese.  Rectal:  Deferred  Msk:  Symmetrical without gross deformities. Normal posture.. Extremities:  Without clubbing or edema. Neurologic:  Alert and  oriented x4;  grossly normal neurologically. Psych:  Alert and cooperative. Normal mood and affect.  ASSESSMENT: Jamie [name] is a 48 y.o. female presenting today with 6 month history of diarrhea, significantly changed from baseline of BMs daily. Stool studies from PCP including Cdiff are negative. No rectal bleeding. Last colonoscopy in 2004 by Dr. Lovell SheehanJenkins, but this is not available in the system. Unclear if she may have had a self-limiting gastroenteritis months ago and now dealing with post-infectious IBS. Med effect less likely, as she has had no changes and notably has been on metformin chronically without any issues. Will need diagnostic colonoscopy in near future.  Dyspepsia:  with GERD, no dysphagia. Has been on omeprazole for many years. Will trial Protonix. Zofran for nausea. As of note, she has had long-standing nausea dating back to the early 2000s without clear etiology.    PLAN: 1. Proceed with TCS with random colonic biopsies with Dr. Jena Gaussourk in near future: the risks, benefits, and alternatives have been discussed with the patient in detail. The patient states understanding and desires to proceed. Will use Propofol due to polypharmacy.  2. Stop omeprazole. Start Protonix once each morning. Zofran prn sent to pharmacy.  3. Bentyl provided to take before meals and at bedtime.  4. Call if worsening symptoms.   5. Follow-up thereafter.  Gelene MinkAnna W. Boone, PhD, ANP-BC [facility]

## 2019-06-12 NOTE — Progress Notes (Deleted)
.  asn

## 2019-06-13 ENCOUNTER — Encounter: Payer: Self-pay | Admitting: *Deleted

## 2019-06-18 ENCOUNTER — Telehealth: Payer: Self-pay | Admitting: Internal Medicine

## 2019-06-18 NOTE — Telephone Encounter (Signed)
No metformin day of procedure.  

## 2019-06-18 NOTE — Telephone Encounter (Signed)
fowarding to AB. Checked encounter form and no meds adjusted

## 2019-06-18 NOTE — Telephone Encounter (Signed)
Patient aware and new instructions mailed to her

## 2019-06-18 NOTE — Progress Notes (Signed)
cc'ed to pcp °

## 2019-06-18 NOTE — Telephone Encounter (Signed)
Pt was calling to let nurse know that she is a diabetic. Her procedure is with RMR on 09/06/2019  [phone]

## 2019-06-25 DIAGNOSIS — E782 Mixed hyperlipidemia: Secondary | ICD-10-CM | POA: Diagnosis not present

## 2019-06-25 DIAGNOSIS — I10 Essential (primary) hypertension: Secondary | ICD-10-CM | POA: Diagnosis not present

## 2019-06-27 ENCOUNTER — Telehealth: Payer: Self-pay | Admitting: Gastroenterology

## 2019-06-27 NOTE — Telephone Encounter (Signed)
Outside labs from May 10, 2019:  BUN 13, creatinine 0.92, Tbili 0.2, Alk Phos 67, AST 44, ALT 26, Hgb 13.4, Hct 41.2, platelets 258, A1c 7.2, TSH 3.750.  Diabetes not ideally controlled. Could have delayed gastric emptying. Continue with plans for colonoscopy. Will see in follow-up thereafter.

## 2019-06-28 ENCOUNTER — Ambulatory Visit (HOSPITAL_COMMUNITY): Payer: Medicare Other | Admitting: Psychiatry

## 2019-06-29 ENCOUNTER — Other Ambulatory Visit: Payer: Self-pay

## 2019-06-29 ENCOUNTER — Ambulatory Visit (INDEPENDENT_AMBULATORY_CARE_PROVIDER_SITE_OTHER): Payer: Medicare Other | Admitting: Psychiatry

## 2019-06-29 DIAGNOSIS — R45851 Suicidal ideations: Secondary | ICD-10-CM | POA: Diagnosis not present

## 2019-06-29 DIAGNOSIS — F333 Major depressive disorder, recurrent, severe with psychotic symptoms: Secondary | ICD-10-CM | POA: Diagnosis not present

## 2019-06-29 DIAGNOSIS — F323 Major depressive disorder, single episode, severe with psychotic features: Secondary | ICD-10-CM

## 2019-06-29 DIAGNOSIS — F330 Major depressive disorder, recurrent, mild: Secondary | ICD-10-CM

## 2019-06-29 MED ORDER — TRAZODONE HCL 100 MG PO TABS: 400.0000 mg | ORAL_TABLET | Freq: Every day | ORAL | 4 refills | Status: DC

## 2019-06-29 MED ORDER — DULOXETINE HCL 60 MG PO CPEP: 120.0000 mg | ORAL_CAPSULE | Freq: Every day | ORAL | 7 refills | Status: DC

## 2019-06-29 MED ORDER — BUPROPION HCL ER (XL) 150 MG PO TB24: ORAL_TABLET | ORAL | 2 refills | Status: DC

## 2019-06-29 MED ORDER — ARIPIPRAZOLE 10 MG PO TABS: 10.0000 mg | ORAL_TABLET | Freq: Every day | ORAL | 5 refills | Status: DC

## 2019-06-29 MED ORDER — CLONAZEPAM 1 MG PO TABS: ORAL_TABLET | ORAL | 5 refills | Status: DC
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
## 2019-07-05 DIAGNOSIS — R0602 Shortness of breath: Secondary | ICD-10-CM | POA: Diagnosis not present

## 2019-07-05 DIAGNOSIS — J209 Acute bronchitis, unspecified: Secondary | ICD-10-CM | POA: Diagnosis not present

## 2019-07-17 DIAGNOSIS — Z1231 Encounter for screening mammogram for malignant neoplasm of breast: Secondary | ICD-10-CM | POA: Diagnosis not present

## 2019-07-18 ENCOUNTER — Ambulatory Visit: Payer: Medicare Other | Admitting: Adult Health

## 2019-07-18 DIAGNOSIS — R0602 Shortness of breath: Secondary | ICD-10-CM | POA: Diagnosis not present

## 2019-07-18 DIAGNOSIS — E7800 Pure hypercholesterolemia, unspecified: Secondary | ICD-10-CM | POA: Diagnosis not present

## 2019-07-18 DIAGNOSIS — E119 Type 2 diabetes mellitus without complications: Secondary | ICD-10-CM | POA: Diagnosis not present

## 2019-07-18 DIAGNOSIS — Z7984 Long term (current) use of oral hypoglycemic drugs: Secondary | ICD-10-CM | POA: Diagnosis not present

## 2019-07-18 DIAGNOSIS — Z79899 Other long term (current) drug therapy: Secondary | ICD-10-CM | POA: Diagnosis not present

## 2019-07-18 DIAGNOSIS — J40 Bronchitis, not specified as acute or chronic: Secondary | ICD-10-CM | POA: Diagnosis not present

## 2019-07-18 DIAGNOSIS — I10 Essential (primary) hypertension: Secondary | ICD-10-CM | POA: Diagnosis not present

## 2019-07-26 DIAGNOSIS — K219 Gastro-esophageal reflux disease without esophagitis: Secondary | ICD-10-CM | POA: Diagnosis not present

## 2019-07-28 DIAGNOSIS — I10 Essential (primary) hypertension: Secondary | ICD-10-CM | POA: Diagnosis not present

## 2019-07-28 DIAGNOSIS — R0602 Shortness of breath: Secondary | ICD-10-CM | POA: Diagnosis not present

## 2019-07-28 DIAGNOSIS — E119 Type 2 diabetes mellitus without complications: Secondary | ICD-10-CM | POA: Diagnosis not present

## 2019-07-28 DIAGNOSIS — Z7952 Long term (current) use of systemic steroids: Secondary | ICD-10-CM | POA: Diagnosis not present

## 2019-07-28 DIAGNOSIS — Z7984 Long term (current) use of oral hypoglycemic drugs: Secondary | ICD-10-CM | POA: Diagnosis not present

## 2019-07-28 DIAGNOSIS — E7800 Pure hypercholesterolemia, unspecified: Secondary | ICD-10-CM | POA: Diagnosis not present

## 2019-07-28 DIAGNOSIS — Z885 Allergy status to narcotic agent status: Secondary | ICD-10-CM | POA: Diagnosis not present

## 2019-07-28 DIAGNOSIS — Z79899 Other long term (current) drug therapy: Secondary | ICD-10-CM | POA: Diagnosis not present

## 2019-07-28 DIAGNOSIS — R05 Cough: Secondary | ICD-10-CM | POA: Diagnosis not present

## 2019-08-03 ENCOUNTER — Institutional Professional Consult (permissible substitution): Payer: Medicare Other | Admitting: Pulmonary Disease

## 2019-08-06 DIAGNOSIS — G4733 Obstructive sleep apnea (adult) (pediatric): Secondary | ICD-10-CM | POA: Diagnosis not present

## 2019-08-20 ENCOUNTER — Other Ambulatory Visit: Payer: Self-pay

## 2019-08-20 ENCOUNTER — Encounter: Payer: Self-pay | Admitting: Pulmonary Disease

## 2019-08-20 ENCOUNTER — Ambulatory Visit: Payer: Medicare Other | Admitting: Pulmonary Disease

## 2019-08-20 VITALS — BP 130/78 | HR 112 | Temp 97.6°F | Ht 63.0 in | Wt 252.6 lb

## 2019-08-20 DIAGNOSIS — R0602 Shortness of breath: Secondary | ICD-10-CM

## 2019-08-20 MED ORDER — BENZONATATE 200 MG PO CAPS: 200.0000 mg | ORAL_CAPSULE | Freq: Three times a day (TID) | ORAL | 1 refills | Status: DC | PRN

## 2019-08-20 NOTE — Patient Instructions (Signed)
We will try and obtain a copy of the result from the chest x-ray and CT scan  Order breathing study  We will give you benzonatate for your cough  Call with significant concerns  We will see you back in 4 weeks

## 2019-08-20 NOTE — Progress Notes (Signed)
Subjective:    Patient ID: Jamie [name], female    DOB: 08-04-1970, 49 y.o.   MRN: [id number]  Patient being seen for complaints of cough, shortness of breath  Has had multiple evaluations recently for cough She does not feel acutely ill at present  Has a history of reflux for which he is on Protonix  History of obstructive sleep apnea Compliant with CPAP use Tolerating CPAP well with no significant concerns  Weight has fluctuated from 216 in 2018 2/260, currently down about 6 to 10 pounds  History of hypertension, diabetes, hypercholesterolemia, allergies and sinuses trouble Sleep apnea   Past Medical History:  Diagnosis Date  . Anxiety   . Bipolar 1 disorder ([place])   . Complication of anesthesia   . Depression   . Diabetes ([place])   . GERD (gastroesophageal reflux disease)   . H/O hiatal hernia   . Headache(784.0)   . High cholesterol   . Hypertension   . PCOS (polycystic ovarian syndrome)   . PONV (postoperative nausea and vomiting)    nausea  . Sleep apnea    CPAP   Social History   Socioeconomic History  . Marital status: Married    Spouse name: Not on file  . Number of children: 0  . Years of education: college  . Highest education level: Not on file  Occupational History  . Occupation: disabled  Tobacco Use  . Smoking status: Never Smoker  . Smokeless tobacco: Never Used  Substance and Sexual Activity  . Alcohol use: No  . Drug use: No  . Sexual activity: Yes    Partners: Male    Birth control/protection: Pill  Other Topics Concern  . Not on file  Social History Narrative   Patient lives at home alone    Patient is right handed   Patient drinks soda's daily   Social Determinants of Health   Financial Resource Strain:   . Difficulty of Paying Living Expenses: Not on file  Food Insecurity:   . Worried About Charity fundraiser in the Last Year: Not on file  . Ran Out of Food in the Last Year: Not on file  Transportation Needs:   . Lack  of Transportation (Medical): Not on file  . Lack of Transportation (Non-Medical): Not on file  Physical Activity:   . Days of Exercise per Week: Not on file  . Minutes of Exercise per Session: Not on file  Stress:   . Feeling of Stress : Not on file  Social Connections:   . Frequency of Communication with Friends and Family: Not on file  . Frequency of Social Gatherings with Friends and Family: Not on file  . Attends Religious Services: Not on file  . Active Member of Clubs or Organizations: Not on file  . Attends Archivist Meetings: Not on file  . Marital Status: Not on file  Intimate Partner Violence:   . Fear of Current or Ex-Partner: Not on file  . Emotionally Abused: Not on file  . Physically Abused: Not on file  . Sexually Abused: Not on file   Family History  Problem Relation Age of Onset  . Anxiety disorder Maternal Grandmother   . Depression Maternal Grandmother   . Colon cancer Neg Hx   . Colon polyps Neg Hx    Review of Systems  Constitutional: Negative for fever and unexpected weight change.  HENT: Negative for congestion, dental problem, ear pain, nosebleeds, postnasal drip, rhinorrhea, sinus pressure, sneezing,  sore throat and trouble swallowing.   Eyes: Negative for redness and itching.  Respiratory: Positive for cough and shortness of breath. Negative for chest tightness and wheezing.   Cardiovascular: Negative for palpitations and leg swelling.  Gastrointestinal: Negative for nausea and vomiting.  Genitourinary: Negative for dysuria.  Musculoskeletal: Negative for joint swelling.  Skin: Negative for rash.  Allergic/Immunologic: Negative.  Negative for environmental allergies, food allergies and immunocompromised state.  Neurological: Positive for headaches.  Hematological: Does not bruise/bleed easily.  Psychiatric/Behavioral: Positive for dysphoric mood. The patient is nervous/anxious.       Objective:   Physical Exam Constitutional:       Appearance: She is obese.  HENT:     Head: Normocephalic.     Nose: Nose normal. No congestion or rhinorrhea.  Eyes:     Pupils: Pupils are equal, round, and reactive to light.  Cardiovascular:     Rate and Rhythm: Normal rate and regular rhythm.     Pulses: Normal pulses.     Heart sounds: Normal heart sounds. No murmur. No friction rub.  Pulmonary:     Effort: Pulmonary effort is normal. No respiratory distress.     Breath sounds: Normal breath sounds. No stridor. No wheezing.  Musculoskeletal:        General: Normal range of motion.     Cervical back: Normal range of motion and neck supple. No rigidity.  Skin:    General: Skin is warm and dry.  Neurological:     General: No focal deficit present.     Mental Status: She is alert.  Psychiatric:        Mood and Affect: Mood normal.    Vitals:   08/20/19 1508  BP: 130/78  Pulse: (!) 112  Temp: 97.6 F (36.4 C)  SpO2: 99%   No flowsheet data found.   Patient did have a chest x-ray and a CT scan of the chest performed recently-results not available, Assessment & Plan:  .  Subacute cough -Benzonatate seems to help -Prescribed benzonatate, cannot tolerate codeine  .  History of significant secondhand smoke exposure -May benefit from PFT  Denies history of asthma growing up  No feeling acutely ill at present  .  Obstructive sleep apnea -Has no concerns or complaints -Claims excellent compliance with CPAP use  Plan  Obtain PFT  Obtain chest x-ray and CT scan results  Continue weight loss efforts  Optimize control of reflux symptoms  I will see her back in about 4 weeks -

## 2019-08-22 ENCOUNTER — Ambulatory Visit (INDEPENDENT_AMBULATORY_CARE_PROVIDER_SITE_OTHER): Payer: Medicare Other | Admitting: Psychiatry

## 2019-08-22 ENCOUNTER — Other Ambulatory Visit: Payer: Self-pay

## 2019-08-22 DIAGNOSIS — F324 Major depressive disorder, single episode, in partial remission: Secondary | ICD-10-CM | POA: Diagnosis not present

## 2019-08-22 DIAGNOSIS — F4322 Adjustment disorder with anxiety: Secondary | ICD-10-CM
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
## 2019-08-24 DIAGNOSIS — E1165 Type 2 diabetes mellitus with hyperglycemia: Secondary | ICD-10-CM | POA: Diagnosis not present

## 2019-08-24 DIAGNOSIS — I10 Essential (primary) hypertension: Secondary | ICD-10-CM | POA: Diagnosis not present

## 2019-08-24 DIAGNOSIS — E7849 Other hyperlipidemia: Secondary | ICD-10-CM | POA: Diagnosis not present

## 2019-08-30 ENCOUNTER — Telehealth: Payer: Self-pay | Admitting: Internal Medicine

## 2019-08-30 NOTE — Patient Instructions (Signed)
Your procedure is scheduled on: 09/06/2019  Report to [facility] at 11:30    AM.  Call this number if you have problems the morning of surgery: [phone]   Remember:              Follow Directions on the letter you received from Your Physician's office regarding the Bowel Prep              No Smoking the day of Procedure :   Take these medicines the morning of surgery with A SIP OF WATER: Wellbutrin, klonipin, Abilify, Flonase, and protonix   Do not wear jewelry, make-up or nail polish.    Do not bring valuables to the hospital.  Contacts, dentures or bridgework may not be worn into surgery.  .   Patients discharged the day of surgery will not be allowed to drive home.     Colonoscopy, Adult, Care After This sheet gives you information about how to care for yourself after your procedure. Your health care provider may also give you more specific instructions. If you have problems or questions, contact your health care provider. What can I expect after the procedure? After the procedure, it is common to have:  A small amount of blood in your stool for 24 hours after the procedure.  Some gas.  Mild abdominal cramping or bloating.  Follow these instructions at home: General instructions   For the first 24 hours after the procedure: ? Do not drive or use machinery. ? Do not sign important documents. ? Do not drink alcohol. ? Do your regular daily activities at a slower pace than normal. ? Eat soft, easy-to-digest foods. ? Rest often.  Take over-the-counter or prescription medicines only as told by your health care provider.  It is up to you to get the results of your procedure. Ask your health care provider, or the department performing the procedure, when your results will be ready. Relieving cramping and bloating  Try walking around when you have cramps or feel bloated.  Apply heat to your abdomen as told by your health care provider. Use a heat source that your  health care provider recommends, such as a moist heat pack or a heating pad. ? Place a towel between your skin and the heat source. ? Leave the heat on for 20-30 minutes. ? Remove the heat if your skin turns bright red. This is especially important if you are unable to feel pain, heat, or cold. You may have a greater risk of getting burned. Eating and drinking  Drink enough fluid to keep your urine clear or pale yellow.  Resume your normal diet as instructed by your health care provider. Avoid heavy or fried foods that are hard to digest.  Avoid drinking alcohol for as long as instructed by your health care provider. Contact a health care provider if:  You have blood in your stool 2-3 days after the procedure. Get help right away if:  You have more than a small spotting of blood in your stool.  You pass large blood clots in your stool.  Your abdomen is swollen.  You have nausea or vomiting.  You have a fever.  You have increasing abdominal pain that is not relieved with medicine. This information is not intended to replace advice given to you by your health care provider. Make sure you discuss any questions you have with your health care provider. Document Released: 02/24/2004 Document Revised: 04/05/2016 Document Reviewed: 09/23/2015 Elsevier Interactive Patient Education  2018 [place].

## 2019-08-30 NOTE — Telephone Encounter (Signed)
Katie from [facility] called to say patient is schedule procedure on 2/11 and her prep is on back order and did we want to change it and send them another prescription with new instructions. Please advise

## 2019-08-30 NOTE — Telephone Encounter (Signed)
Called pt, Miralax instructions completed. She has access to MyChart.

## 2019-09-01 ENCOUNTER — Other Ambulatory Visit: Payer: Self-pay | Admitting: Gastroenterology

## 2019-09-04 ENCOUNTER — Other Ambulatory Visit (HOSPITAL_COMMUNITY)
Admission: RE | Admit: 2019-09-04 | Discharge: 2019-09-04 | Disposition: A | Discharge status: Home / Self Care | Payer: Medicare Other | Source: Ambulatory Visit | Attending: Internal Medicine | Admitting: Internal Medicine

## 2019-09-04 ENCOUNTER — Encounter (HOSPITAL_COMMUNITY): Payer: Self-pay

## 2019-09-04 ENCOUNTER — Encounter (HOSPITAL_COMMUNITY)
Admission: RE | Admit: 2019-09-04 | Discharge: 2019-09-04 | Disposition: A | Discharge status: Home / Self Care | Payer: Medicare Other | Source: Ambulatory Visit | Attending: Internal Medicine | Admitting: Internal Medicine

## 2019-09-04 ENCOUNTER — Other Ambulatory Visit: Payer: Self-pay

## 2019-09-04 DIAGNOSIS — Z20822 Contact with and (suspected) exposure to covid-19: Secondary | ICD-10-CM | POA: Insufficient documentation

## 2019-09-04 DIAGNOSIS — Z01812 Encounter for preprocedural laboratory examination: Secondary | ICD-10-CM | POA: Insufficient documentation

## 2019-09-04 HISTORY — DX: Unspecified osteoarthritis, unspecified site: M19.90

## 2019-09-04 LAB — HCG, SERUM, QUALITATIVE: Preg, Serum: NEGATIVE

## 2019-09-04 LAB — SARS CORONAVIRUS 2 (TAT 6-24 HRS): SARS Coronavirus 2: NEGATIVE

## 2019-09-06 ENCOUNTER — Ambulatory Visit (HOSPITAL_COMMUNITY): Payer: Medicare Other | Admitting: Anesthesiology

## 2019-09-06 ENCOUNTER — Encounter (HOSPITAL_COMMUNITY)
Admission: RE | Disposition: A | Discharge status: Home / Self Care | Payer: Self-pay | Source: Home / Self Care | Attending: Internal Medicine

## 2019-09-06 ENCOUNTER — Encounter (HOSPITAL_COMMUNITY): Payer: Self-pay | Admitting: Internal Medicine

## 2019-09-06 ENCOUNTER — Ambulatory Visit (HOSPITAL_COMMUNITY)
Admission: RE | Admit: 2019-09-06 | Discharge: 2019-09-06 | Disposition: A | Discharge status: Home / Self Care | Payer: Medicare Other | Attending: Internal Medicine | Admitting: Internal Medicine

## 2019-09-06 DIAGNOSIS — K529 Noninfective gastroenteritis and colitis, unspecified: Secondary | ICD-10-CM | POA: Diagnosis not present

## 2019-09-06 DIAGNOSIS — M1990 Unspecified osteoarthritis, unspecified site: Secondary | ICD-10-CM | POA: Diagnosis not present

## 2019-09-06 DIAGNOSIS — Z7984 Long term (current) use of oral hypoglycemic drugs: Secondary | ICD-10-CM | POA: Diagnosis not present

## 2019-09-06 DIAGNOSIS — E7800 Pure hypercholesterolemia, unspecified: Secondary | ICD-10-CM | POA: Insufficient documentation

## 2019-09-06 DIAGNOSIS — K5730 Diverticulosis of large intestine without perforation or abscess without bleeding: Secondary | ICD-10-CM | POA: Diagnosis not present

## 2019-09-06 DIAGNOSIS — K219 Gastro-esophageal reflux disease without esophagitis: Secondary | ICD-10-CM | POA: Diagnosis not present

## 2019-09-06 DIAGNOSIS — R197 Diarrhea, unspecified: Secondary | ICD-10-CM | POA: Diagnosis not present

## 2019-09-06 DIAGNOSIS — Z885 Allergy status to narcotic agent status: Secondary | ICD-10-CM | POA: Insufficient documentation

## 2019-09-06 DIAGNOSIS — G4730 Sleep apnea, unspecified: Secondary | ICD-10-CM | POA: Diagnosis not present

## 2019-09-06 DIAGNOSIS — E119 Type 2 diabetes mellitus without complications: Secondary | ICD-10-CM | POA: Diagnosis not present

## 2019-09-06 DIAGNOSIS — I10 Essential (primary) hypertension: Secondary | ICD-10-CM | POA: Diagnosis not present

## 2019-09-06 DIAGNOSIS — Z79899 Other long term (current) drug therapy: Secondary | ICD-10-CM | POA: Insufficient documentation

## 2019-09-06 DIAGNOSIS — F419 Anxiety disorder, unspecified: Secondary | ICD-10-CM | POA: Insufficient documentation

## 2019-09-06 DIAGNOSIS — F319 Bipolar disorder, unspecified: Secondary | ICD-10-CM | POA: Diagnosis not present

## 2019-09-06 DIAGNOSIS — Z6841 Body Mass Index (BMI) 40.0 and over, adult: Secondary | ICD-10-CM | POA: Insufficient documentation

## 2019-09-06 DIAGNOSIS — E282 Polycystic ovarian syndrome: Secondary | ICD-10-CM | POA: Insufficient documentation

## 2019-09-06 HISTORY — PX: COLONOSCOPY WITH PROPOFOL: SHX5780

## 2019-09-06 HISTORY — PX: BIOPSY: SHX5522

## 2019-09-06 LAB — GLUCOSE, CAPILLARY: Glucose-Capillary: 133 mg/dL — ABNORMAL HIGH (ref 70–99)

## 2019-09-06 SURGERY — COLONOSCOPY WITH PROPOFOL
Anesthesia: General

## 2019-09-06 MED ORDER — PROPOFOL 10 MG/ML IV BOLUS
INTRAVENOUS | Status: DC | PRN
  Administered 2019-09-06: 100 mg via INTRAVENOUS

## 2019-09-06 MED ORDER — CHLORHEXIDINE GLUCONATE CLOTH 2 % EX PADS: 6.0000 | MEDICATED_PAD | Freq: Once | CUTANEOUS | Status: DC

## 2019-09-06 MED ORDER — PROPOFOL 10 MG/ML IV BOLUS
INTRAVENOUS | Status: AC
  Filled 2019-09-06: qty 40

## 2019-09-06 MED ORDER — PROPOFOL 10 MG/ML IV BOLUS
INTRAVENOUS | Status: AC
  Filled 2019-09-06: qty 20

## 2019-09-06 MED ORDER — STERILE WATER FOR IRRIGATION IR SOLN
Status: DC | PRN
  Administered 2019-09-06: 1.5 mL

## 2019-09-06 MED ORDER — LACTATED RINGERS IV SOLN: INTRAVENOUS | Status: DC | PRN

## 2019-09-06 MED ORDER — LACTATED RINGERS IV SOLN
Freq: Once | INTRAVENOUS | Status: AC
  Administered 2019-09-06: 12:00:00 1000 mL via INTRAVENOUS

## 2019-09-06 MED ORDER — LIDOCAINE HCL (CARDIAC) PF 100 MG/5ML IV SOSY
PREFILLED_SYRINGE | INTRAVENOUS | Status: DC | PRN
  Administered 2019-09-06: 50 mg via INTRAVENOUS

## 2019-09-06 MED ORDER — PROPOFOL 500 MG/50ML IV EMUL
INTRAVENOUS | Status: DC | PRN
  Administered 2019-09-06: 200 ug/kg/min via INTRAVENOUS

## 2019-09-06 NOTE — Op Note (Signed)
[facility] Patient Name: Jamie [name] Procedure Date: 09/06/2019 10:07 AM MRN: [id number] Date of Birth: [birth year] Attending MD: Norvel Richards , MD CSN: [id number] Age: 49 Admit Type: Outpatient Procedure:                Colonoscopy Indications:              Chronic diarrhea Providers:                Norvel Richards, MD, Charlsie Quest. Theda Sers RN, RN,                            Aram Candela Referring MD:              Medicines:                Propofol per Anesthesia Complications:            No immediate complications. Estimated Blood Loss:     Estimated blood loss was minimal. Procedure:                After obtaining informed consent, the colonoscope                            was passed under direct vision. Throughout the                            procedure, the patient's blood pressure, pulse, and                            oxygen saturations were monitored continuously. The                            [id number]-[id number] ([id number]) scope was introduced through                            the anus and advanced to the the cecum, identified                            by appendiceal orifice and ileocecal valve. Scope In: 12:27:34 PM Scope Out: 12:42:40 PM Scope Withdrawal Time: 0 hours 9 minutes 33 seconds  Total Procedure Duration: 0 hours 15 minutes 6 seconds  Findings:      The perianal and digital rectal examinations were normal.      Scattered medium-mouthed diverticula were found in the entire colon.       Distal 5 cm's of TI appeared normal      The exam was otherwise without abnormality on direct and retroflexion       views. Segmental biopsies of the right and left colon taken to further       evaluate for chronic diarrhea. Impression:               - Diverticulosis in the entire examined colon.                            Status post segmental biopsy.                           - The examination was  otherwise normal on direct                            and  retroflexion views. Moderate Sedation:      Moderate (conscious) sedation was personally administered by an       anesthesia professional. The following parameters were monitored: oxygen       saturation, heart rate, blood pressure, respiratory rate, EKG, adequacy       of pulmonary ventilation, and response to care. Recommendation:           - Patient has a contact number available for                            emergencies. The signs and symptoms of potential                            delayed complications were discussed with the                            patient. Return to normal activities tomorrow.                            Written discharge instructions were provided to the                            patient.                           - Advance diet as tolerated. Follow-up on pathology.                           - Repeat colonoscopy in 10 years for screening                            purposes.                           - Return to GI clinic in 6 weeks. Procedure Code(s):        --- Professional ---                           [phone], Colonoscopy, flexible; diagnostic, including                            collection of specimen(s) by brushing or washing,                            when performed (separate procedure) Diagnosis Code(s):        --- Professional ---                           K52.9, Noninfective gastroenteritis and colitis,                            unspecified  K57.30, Diverticulosis of large intestine without                            perforation or abscess without bleeding CPT copyright 2019 American Medical Association. All rights reserved. The codes documented in this report are preliminary and upon coder review may  be revised to meet current compliance requirements. Gerrit Friends. Maree Ainley, MD Gennette Pac, MD 09/06/2019 12:58:18 PM This report has been signed electronically. Number of Addenda: 0

## 2019-09-06 NOTE — Discharge Instructions (Signed)
Diverticulosis  Diverticulosis is a condition that develops when small pouches (diverticula) form in the wall of the large intestine (colon). The colon is where water is absorbed and stool (feces) is formed. The pouches form when the inside layer of the colon pushes through weak spots in the outer layers of the colon. You may have a few pouches or many of them. The pouches usually do not cause problems unless they become inflamed or infected. When this happens, the condition is called diverticulitis. What are the causes? The cause of this condition is not known. What increases the risk? The following factors may make you more likely to develop this condition: Being older than age 45. Your risk for this condition increases with age. Diverticulosis is rare among people younger than age 42. By age 45, many people have it. Eating a low-fiber diet. Having frequent constipation. Being overweight. Not getting enough exercise. Smoking. Taking over-the-counter pain medicines, like aspirin and ibuprofen. Having a family history of diverticulosis. What are the signs or symptoms? In most people, there are no symptoms of this condition. If you do have symptoms, they may include: Bloating. Cramps in the abdomen. Constipation or diarrhea. Pain in the lower left side of the abdomen. How is this diagnosed? Because diverticulosis usually has no symptoms, it is most often diagnosed during an exam for other colon problems. The condition may be diagnosed by: Using a flexible scope to examine the colon (colonoscopy). Taking an X-ray of the colon after dye has been put into the colon (barium enema). Having a CT scan. How is this treated? You may not need treatment for this condition. Your health care provider may recommend treatment to prevent problems. You may need treatment if you have symptoms or if you previously had diverticulitis. Treatment may include: Eating a high-fiber diet. Taking a fiber  supplement. Taking a live bacteria supplement (probiotic). Taking medicine to relax your colon. Follow these instructions at home: Medicines Take over-the-counter and prescription medicines only as told by your health care provider. If told by your health care provider, take a fiber supplement or probiotic. Constipation prevention Your condition may cause constipation. To prevent or treat constipation, you may need to: Drink enough fluid to keep your urine pale yellow. Take over-the-counter or prescription medicines. Eat foods that are high in fiber, such as beans, whole grains, and fresh fruits and vegetables. Limit foods that are high in fat and processed sugars, such as fried or sweet foods.  General instructions Try not to strain when you have a bowel movement. Keep all follow-up visits as told by your health care provider. This is important. Contact a health care provider if you: Have pain in your abdomen. Have bloating. Have cramps. Have not had a bowel movement in 3 days. Get help right away if: Your pain gets worse. Your bloating becomes very bad. You have a fever or chills, and your symptoms suddenly get worse. You vomit. You have bowel movements that are bloody or black. You have bleeding from your rectum. Summary Diverticulosis is a condition that develops when small pouches (diverticula) form in the wall of the large intestine (colon). You may have a few pouches or many of them. This condition is most often diagnosed during an exam for other colon problems. Treatment may include increasing the fiber in your diet, taking supplements, or taking medicines. This information is not intended to replace advice given to you by your health care provider. Make sure you discuss any questions you have with  your health care provider. Document Revised: 02/08/2019 Document Reviewed: 02/08/2019 Elsevier Patient Education  2020 [employer] Inc[employer]  Colonoscopy Discharge  Instructions  Read the instructions outlined below and refer to this sheet in the next few weeks. These discharge instructions provide you with general information on caring for yourself after you leave the hospital. Your doctor may also give you specific instructions. While your treatment has been planned according to the most current medical practices available, unavoidable complications occasionally occur. If you have any problems or questions after discharge, call Dr. [name] at [phone]. ACTIVITY  You may resume your regular activity, but move at a slower pace for the next 24 hours.   Take frequent rest periods for the next 24 hours.   Walking will help get rid of the air and reduce the bloated feeling in your belly (abdomen).   No driving for 24 hours (because of the medicine (anesthesia) used during the test).    Do not sign any important legal documents or operate any machinery for 24 hours (because of the anesthesia used during the test).  NUTRITION  Drink plenty of fluids.   You may resume your normal diet as instructed by your doctor.   Begin with a light meal and progress to your normal diet. Heavy or fried foods are harder to digest and may make you feel sick to your stomach (nauseated).   Avoid alcoholic beverages for 24 hours or as instructed.  MEDICATIONS  You may resume your normal medications unless your doctor tells you otherwise.  WHAT YOU CAN EXPECT TODAY  Some feelings of bloating in the abdomen.   Passage of more gas than usual.   Spotting of blood in your stool or on the toilet paper.  IF YOU HAD POLYPS REMOVED DURING THE COLONOSCOPY:  No aspirin products for 7 days or as instructed.   No alcohol for 7 days or as instructed.   Eat a soft diet for the next 24 hours.  FINDING OUT THE RESULTS OF YOUR TEST Not all test results are available during your visit. If your test results are not back during the visit, make an appointment with your caregiver to find  out the results. Do not assume everything is normal if you have not heard from your caregiver or the medical facility. It is important for you to follow up on all of your test results.  SEEK IMMEDIATE MEDICAL ATTENTION IF:  You have more than a spotting of blood in your stool.   Your belly is swollen (abdominal distention).   You are nauseated or vomiting.   You have a temperature over 101.   You have abdominal pain or discomfort that is severe or gets worse throughout the day.   Diverticulosis information provided  Further recommendations to follow pending review of pathology report  Office visit with [place] in 2 months  At patient request, I called Kollyns Mickelson at [phone] -no contact

## 2019-09-06 NOTE — Anesthesia Postprocedure Evaluation (Signed)
Anesthesia Post Note  Patient: Jamie [name]  Procedure(s) Performed: COLONOSCOPY WITH PROPOFOL ([name] ) BIOPSY  Patient location during evaluation: PACU Anesthesia Type: MAC Level of consciousness: awake, awake and alert, oriented and patient cooperative Pain management: pain level controlled Vital Signs Assessment: post-procedure vital signs reviewed and stable Respiratory status: spontaneous breathing, nonlabored ventilation and respiratory function stable Cardiovascular status: stable Postop Assessment: no apparent nausea or vomiting Anesthetic complications: no     Last Vitals:  Vitals:   09/06/19 1152  BP: 127/85  Resp: (!) 22  Temp: 36.8 C  SpO2: 96%    Last Pain:  Vitals:   09/06/19 1231  TempSrc:   PainSc: 0-No pain                 Jalisa Sacco

## 2019-09-06 NOTE — Anesthesia Preprocedure Evaluation (Addendum)
Anesthesia Evaluation  Patient identified by MRN, date of birth, ID band Patient awake    Reviewed: Allergy & Precautions, NPO status , Patient's Chart, lab work & pertinent test results  History of Anesthesia Complications (+) PONV and history of anesthetic complications  Airway Mallampati: II  TM Distance: >3 FB Neck ROM: Full    Dental  (+) Teeth Intact   Pulmonary sleep apnea and Continuous Positive Airway Pressure Ventilation ,    Pulmonary exam normal breath sounds clear to auscultation       Cardiovascular hypertension, Pt. on medications  Rhythm:Regular Rate:Normal     Neuro/Psych  Headaches, PSYCHIATRIC DISORDERS Anxiety Depression Bipolar Disorder    GI/Hepatic Neg liver ROS, hiatal hernia, GERD (no reflux today)  Medicated and Poorly Controlled,  Endo/Other  diabetes (FSBS - 133), Well Controlled, Type 2, Oral Hypoglycemic AgentsMorbid obesity  Renal/GU negative Renal ROS     Musculoskeletal  (+) Arthritis , Osteoarthritis,    Abdominal   Peds  Hematology   Anesthesia Other Findings   Reproductive/Obstetrics                           Anesthesia Physical Anesthesia Plan  ASA: III  Anesthesia Plan: General   Post-op Pain Management:    Induction: Intravenous  PONV Risk Score and Plan: TIVA and Treatment may vary due to age or medical condition  Airway Management Planned: Natural Airway, Nasal Cannula and Simple Face Mask  Additional Equipment:   Intra-op Plan:   Post-operative Plan:   Informed Consent: I have reviewed the patients History and Physical, chart, labs and discussed the procedure including the risks, benefits and alternatives for the proposed anesthesia with the patient or authorized representative who has indicated his/her understanding and acceptance.     Dental advisory given  Plan Discussed with: CRNA and Surgeon  Anesthesia Plan Comments:         Anesthesia Quick Evaluation

## 2019-09-06 NOTE — Addendum Note (Signed)
Addendum  created 09/06/19 1327 by Shanon Payor, CRNA   Review and Sign - Signed

## 2019-09-06 NOTE — H&P (Signed)
@LOGO @   Primary Care Physician:  Jalene Mullet, PA-C Primary Gastroenterologist:  Dr. Gala Romney  Pre-Procedure History & Physical: HPI:  Jamie [name] is a 49 y.o. female here for further evaluation of chronic diarrhea via colonoscopy. Protonix has helped her reflux considerably but still with episodes of nausea.  Diarrhea symptoms have not changed.  Past Medical History:  Diagnosis Date  . Anxiety   . Arthritis   . Bipolar 1 disorder ([place])   . Complication of anesthesia   . Depression   . Diabetes ([place])   . GERD (gastroesophageal reflux disease)   . H/O hiatal hernia   . Headache(784.0)   . High cholesterol   . Hypertension   . PCOS (polycystic ovarian syndrome)   . PONV (postoperative nausea and vomiting)    nausea  . Sleep apnea    CPAP    Past Surgical History:  Procedure Laterality Date  . ANAL FISSURE REPAIR     x 3  . BACK SURGERY  2009   x2  . CHOLECYSTECTOMY  07/2002  . KNEE ARTHROSCOPY Right   . LUMBAR LAMINECTOMY/DECOMPRESSION MICRODISCECTOMY Right 01/22/2014   Procedure: LUMBAR FOUR TO FIVE LUMBAR LAMINECTOMY/DECOMPRESSION MICRODISCECTOMY 1 LEVEL;  Surgeon: Floyce Stakes, MD;  Location: [facility];  Service: Neurosurgery;  Laterality: Right;  Right L45 diskectomy    Prior to Admission medications   Medication Sig Start Date End Date Taking? Authorizing Provider  albuterol (VENTOLIN HFA) 108 (90 Base) MCG/ACT inhaler Inhale 2 puffs into the lungs every 6 (six) hours as needed for wheezing or shortness of breath.   Yes [provider]  ARIPiprazole (ABILIFY) 10 MG tablet Take 1 tablet (10 mg total) by mouth daily. 06/29/19  Yes Plovsky, Berneta Sages, MD  benzonatate (TESSALON) 200 MG capsule Take 1 capsule (200 mg total) by mouth 3 (three) times daily as needed for cough. 08/20/19  Yes Olalere, Adewale A, MD  buPROPion (WELLBUTRIN XL) 150 MG 24 hr tablet 3  qam Patient taking differently: Take 450 mg by mouth daily.  06/29/19  Yes Plovsky, Berneta Sages, MD   clonazePAM (KLONOPIN) 1 MG tablet 1 qam 2  qhs Patient taking differently: Take 1-2 mg by mouth See admin instructions. Take 1 mg in the morning and 2 mg at night 06/29/19  Yes Plovsky, Berneta Sages, MD  dicyclomine (BENTYL) 10 MG capsule Take 1 capsule (10 mg total) by mouth 4 (four) times daily -  before meals and at bedtime. 06/12/19  Yes Annitta Needs, NP  DULoxetine (CYMBALTA) 60 MG capsule Take 2 capsules (120 mg total) by mouth daily. Patient taking differently: Take 120 mg by mouth at bedtime.  06/29/19  Yes Plovsky, Berneta Sages, MD  fluticasone (FLONASE) 50 MCG/ACT nasal spray Place 1 spray into both nostrils daily as needed for allergies or rhinitis.    Yes [provider]  lisinopril (PRINIVIL,ZESTRIL) 5 MG tablet Take 5 mg by mouth daily.   Yes [provider]  Menthol, Topical Analgesic, (BIOFREEZE EX) Apply 1 application topically daily as needed (back pain).   Yes [provider]  metFORMIN (GLUCOPHAGE-XR) 500 MG 24 hr tablet Take 2,000 mg by mouth daily. 07/29/19  Yes [provider]  ondansetron (ZOFRAN-ODT) 4 MG disintegrating tablet TAKE 1 TABLET BY MOUTH EVERY 8 HOURS AS NEEDED FOR NAUSEA AND VOMITING 09/03/19  Yes Annitta Needs, NP  pantoprazole (PROTONIX) 40 MG tablet Take 1 tablet (40 mg total) by mouth daily. Take 30 minutes before breakfast 06/12/19  Yes Roseanne Kaufman  W, NP  polyethylene glycol-electrolytes (NULYTELY/GOLYTELY) 420 g solution As directed 06/12/19  Yes Johniya Durfee, Gerrit Friends, MD  rosuvastatin (CRESTOR) 20 MG tablet Take 10 mg by mouth daily.    Yes [provider]  topiramate (TOPAMAX) 50 MG tablet Take 1 tablet (50 mg total) by mouth 2 (two) times daily. 02/06/18  Yes Nilda Riggs, NP  traZODone (DESYREL) 100 MG tablet Take 4 tablets (400 mg total) by mouth at bedtime. 06/29/19  Yes Archer Asa, MD    Allergies as of 06/12/2019 - Review Complete 06/12/2019  Allergen Reaction Noted  . Codeine Hives and Swelling 06/07/2011  .  Morphine and related Swelling and Rash 01/21/2014    Family History  Problem Relation Age of Onset  . Anxiety disorder Maternal Grandmother   . Depression Maternal Grandmother   . Colon cancer Neg Hx   . Colon polyps Neg Hx     Social History   Socioeconomic History  . Marital status: Married    Spouse name: Not on file  . Number of children: 0  . Years of education: college  . Highest education level: Not on file  Occupational History  . Occupation: disabled  Tobacco Use  . Smoking status: Never Smoker  . Smokeless tobacco: Never Used  Substance and Sexual Activity  . Alcohol use: No  . Drug use: No  . Sexual activity: Yes    Partners: Male    Birth control/protection: Pill  Other Topics Concern  . Not on file  Social History Narrative   Patient lives at home alone    Patient is right handed   Patient drinks soda's daily   Social Determinants of Health   Financial Resource Strain:   . Difficulty of Paying Living Expenses: Not on file  Food Insecurity:   . Worried About Programme researcher, broadcasting/film/video in the Last Year: Not on file  . Ran Out of Food in the Last Year: Not on file  Transportation Needs:   . Lack of Transportation (Medical): Not on file  . Lack of Transportation (Non-Medical): Not on file  Physical Activity:   . Days of Exercise per Week: Not on file  . Minutes of Exercise per Session: Not on file  Stress:   . Feeling of Stress : Not on file  Social Connections:   . Frequency of Communication with Friends and Family: Not on file  . Frequency of Social Gatherings with Friends and Family: Not on file  . Attends Religious Services: Not on file  . Active Member of Clubs or Organizations: Not on file  . Attends Banker Meetings: Not on file  . Marital Status: Not on file  Intimate Partner Violence:   . Fear of Current or Ex-Partner: Not on file  . Emotionally Abused: Not on file  . Physically Abused: Not on file  . Sexually Abused: Not on file     Review of Systems: See HPI, otherwise negative ROS  Physical Exam: BP 127/85   Temp 98.2 F (36.8 C) (Oral)   Resp (!) 22   SpO2 96%  General:   Alert,  Well-developed, well-nourished, pleasant and cooperative in NAD Neck:  Supple; no masses or thyromegaly. No significant cervical adenopathy. Lungs:  Clear throughout to auscultation.   No wheezes, crackles, or rhonchi. No acute distress. Heart:  Regular rate and rhythm; no murmurs, clicks, rubs,  or gallops. Abdomen: Non-distended, normal bowel sounds.  Soft and nontender without appreciable mass or hepatosplenomegaly.  Pulses:  Normal  pulses noted. Extremities:  Without clubbing or edema.  Impression/Plan: 49 year old lady here for evaluation of chronic diarrhea.  Stool studies negative.  Of offer the patient a diagnostic colonoscopy today.  The risks, benefits, limitations, alternatives and imponderables have been reviewed with the patient. Questions have been answered. All parties are agreeable.      Notice: This dictation was prepared with Dragon dictation along with smaller phrase technology. Any transcriptional errors that result from this process are unintentional and may not be corrected upon review.

## 2019-09-06 NOTE — Transfer of Care (Signed)
Immediate Anesthesia Transfer of Care Note  Patient: Jamie [name]  Procedure(s) Performed: COLONOSCOPY WITH PROPOFOL ([name] ) BIOPSY  Patient Location: PACU  Anesthesia Type:MAC  Level of Consciousness: awake, alert , oriented and patient cooperative  Airway & Oxygen Therapy: Patient Spontanous Breathing  Post-op Assessment: Report given to RN and Post -op Vital signs reviewed and stable  Post vital signs: Reviewed and stable  Last Vitals:  Vitals Value Taken Time  BP    Temp    Pulse    Resp    SpO2      Last Pain:  Vitals:   09/06/19 1231  TempSrc:   PainSc: 0-No pain         Complications: No apparent anesthesia complications

## 2019-09-07 LAB — SURGICAL PATHOLOGY

## 2019-09-11 ENCOUNTER — Encounter: Payer: Self-pay | Admitting: Internal Medicine

## 2019-09-17 ENCOUNTER — Telehealth: Payer: Self-pay

## 2019-09-17 ENCOUNTER — Other Ambulatory Visit (HOSPITAL_COMMUNITY): Payer: Self-pay | Admitting: Psychiatry

## 2019-09-17 DIAGNOSIS — E119 Type 2 diabetes mellitus without complications: Secondary | ICD-10-CM | POA: Diagnosis not present

## 2019-09-17 DIAGNOSIS — K219 Gastro-esophageal reflux disease without esophagitis: Secondary | ICD-10-CM | POA: Diagnosis not present

## 2019-09-17 DIAGNOSIS — R7301 Impaired fasting glucose: Secondary | ICD-10-CM | POA: Diagnosis not present

## 2019-09-17 DIAGNOSIS — E781 Pure hyperglyceridemia: Secondary | ICD-10-CM | POA: Diagnosis not present

## 2019-09-17 DIAGNOSIS — I10 Essential (primary) hypertension: Secondary | ICD-10-CM | POA: Diagnosis not present

## 2019-09-17 NOTE — Telephone Encounter (Signed)
Pt called with c/o with mid lower abdominal pain, sour tasting belching, nausea which worsens when pt eats. Abdominal pain doesn't improve after having a BM. Pt is having a watery BM daily in the mornings and after eating. No solid BM's in the last 6 months. Pt reports having accidents when sleeping. Pt doesn't take any medicine to have BM. Pt takes Dicyclomine four times daily which helps some. Pt isn't eating any foods that are greasy or oily.

## 2019-09-17 NOTE — Telephone Encounter (Signed)
Spoke with pt and she was made aware of AB's recommendations.  She was informed to make sure she is taking bentyl before meals and at bedtime.  She was advised to contact her PCP to see if she can have a trial off metformin.  Pt voiced understanding.   She scheduled an appointment for tomorrow 09/18/2019.

## 2019-09-17 NOTE — Telephone Encounter (Addendum)
Reviewed chart. Colonoscopy completed 2/11. pancolonic diverticulosis. Segmental biopsy completed with benign biopsies.   Please arrange for an office visit this week. I recommend imaging at this point. Can further sort out at office visit.   Please make sure she is taking bentyl before meals and at bedtime.  I strongly recommend having her contact PCP to see if she can have a trial off metformin to see if this helps symptoms.

## 2019-09-18 ENCOUNTER — Other Ambulatory Visit: Payer: Self-pay

## 2019-09-18 ENCOUNTER — Telehealth: Payer: Self-pay | Admitting: Internal Medicine

## 2019-09-18 ENCOUNTER — Ambulatory Visit: Payer: Medicare Other | Admitting: Gastroenterology

## 2019-09-18 ENCOUNTER — Encounter: Payer: Self-pay | Admitting: Gastroenterology

## 2019-09-18 VITALS — BP 124/85 | HR 101 | Temp 97.3°F | Ht 62.0 in | Wt 248.2 lb

## 2019-09-18 DIAGNOSIS — K529 Noninfective gastroenteritis and colitis, unspecified: Secondary | ICD-10-CM | POA: Diagnosis not present

## 2019-09-18 DIAGNOSIS — R1013 Epigastric pain: Secondary | ICD-10-CM

## 2019-09-18 DIAGNOSIS — G8929 Other chronic pain: Secondary | ICD-10-CM | POA: Diagnosis not present

## 2019-09-18 NOTE — Telephone Encounter (Signed)
Patient called back and said her pcp said it was ok for her to stop metformin for a month

## 2019-09-18 NOTE — Patient Instructions (Signed)
We are scheduling a CT scan in near future. The day of the scan DO NOT TAKE METFORMIN. Do not take metformin following the scan for 48 hours.   Continue dicyclomine.  I have given samples of pancreas enzymes. Take 2 capsules with meals and 1 with snacks. No more than 8 a day.   Further recommendations to follow!  I enjoyed seeing you again today! As you know, I value our relationship and want to provide genuine, compassionate, and quality care. I welcome your feedback. If you receive a survey regarding your visit,  I greatly appreciate you taking time to fill this out. See you next time!  Gelene Mink, PhD, ANP-BC [facility] G[facility]enter[facility]

## 2019-09-18 NOTE — Progress Notes (Signed)
Referring Provider: Riley Lam Primary Care Physician:  Jalene Mullet, PA-C  Primary GI: Dr. Gala Romney   Chief Complaint  Patient presents with  . Abdominal Pain  . Diarrhea    HPI:   Jamie [name] is a 49 y.o. female presenting today with a history of chronic diarrhea, nausea, and GERD. Stool studies including culture, O&P, and Cdiff were negative in Oct 2020. In interim from last visit, she underwent colonoscopy with negative segmental biopsies.   Dyspepsia: with GERD. Trial of Protonix at last visit, with Zofran prn. Nausea dating back to early 2000s without clear etiology. No prior GES. Not waking up with GERD at night any longer. No more esophageal burning. Protonix helping. Nausea some improved. No vomiting.   Diarrhea: Bentyl provided previously. Went from 10 loose stools per day to 3. Worst thing is at night when she has fecal incontinence. Able to control during the day. Takes Bentyl QID.   Weight 253 in Nov 2020. Today 248.   Upper abdominal discomfort. Had to place a heating pad yesterday, which helped some. Always present, waxing and waning. No relief after BM. No postprandial pain. States she called her PCP to request if holding off on metformin for a trial run and has left a message. Appetite is not good.      Past Medical History:  Diagnosis Date  . Anxiety   . Arthritis   . Bipolar 1 disorder ([place])   . Complication of anesthesia   . Depression   . Diabetes ([place])   . GERD (gastroesophageal reflux disease)   . H/O hiatal hernia   . Headache(784.0)   . High cholesterol   . Hypertension   . PCOS (polycystic ovarian syndrome)   . PONV (postoperative nausea and vomiting)    nausea  . Sleep apnea    CPAP    Past Surgical History:  Procedure Laterality Date  . ANAL FISSURE REPAIR     x 3  . BACK SURGERY  2009   x2  . BIOPSY  09/06/2019   Procedure: BIOPSY;  Surgeon: Daneil Dolin, MD;  Location: [facility];  Service: Endoscopy;;  .  CHOLECYSTECTOMY  07/2002  . COLONOSCOPY WITH PROPOFOL [name] 09/06/2019   pancolonic diverticulosis. Segmental biopsy completed with benign biopsies.   [name] Kitchen KNEE ARTHROSCOPY Right   . LUMBAR LAMINECTOMY/DECOMPRESSION MICRODISCECTOMY Right 01/22/2014   Procedure: LUMBAR FOUR TO FIVE LUMBAR LAMINECTOMY/DECOMPRESSION MICRODISCECTOMY 1 LEVEL;  Surgeon: Floyce Stakes, MD;  Location: [place] [facility];  Service: Neurosurgery;  Laterality: Right;  Right L45 diskectomy    Current Outpatient Medications  Medication Sig Dispense Refill  . albuterol (VENTOLIN HFA) 108 (90 Base) MCG/ACT inhaler Inhale 2 puffs into the lungs every 6 (six) hours as needed for wheezing or shortness of breath.    . ARIPiprazole (ABILIFY) 10 MG tablet Take 1 tablet (10 mg total) by mouth daily. 30 tablet 5  . benzonatate (TESSALON) 200 MG capsule Take 1 capsule (200 mg total) by mouth 3 (three) times daily as needed for cough. 60 capsule 1  . buPROPion (WELLBUTRIN XL) 150 MG 24 hr tablet 3  qam (Patient taking differently: Take 450 mg by mouth daily. ) 90 tablet 2  . clonazePAM (KLONOPIN) 1 MG tablet 1 qam 2  qhs (Patient taking differently: Take 1-2 mg by mouth See admin instructions. Take 1 mg in the morning and 2 mg at night) 90 tablet 5  . dicyclomine (BENTYL) 10 MG capsule Take 1  capsule (10 mg total) by mouth 4 (four) times daily -  before meals and at bedtime. 120 capsule 3  . DULoxetine (CYMBALTA) 60 MG capsule Take 2 capsules (120 mg total) by mouth daily. (Patient taking differently: Take 120 mg by mouth at bedtime. ) 180 capsule 7  . fluticasone (FLONASE) 50 MCG/ACT nasal spray Place 1 spray into both nostrils daily as needed for allergies or rhinitis.     [name] Kitchen lisinopril (PRINIVIL,ZESTRIL) 5 MG tablet Take 5 mg by mouth daily.    . Menthol, Topical Analgesic, (BIOFREEZE EX) Apply 1 application topically daily as needed (back pain).    . metFORMIN (GLUCOPHAGE-XR) 500 MG 24 hr tablet Take 2,000 mg by mouth daily.    . ondansetron  (ZOFRAN-ODT) 4 MG disintegrating tablet TAKE 1 TABLET BY MOUTH EVERY 8 HOURS AS NEEDED FOR NAUSEA AND VOMITING 40 tablet 3  . pantoprazole (PROTONIX) 40 MG tablet Take 1 tablet (40 mg total) by mouth daily. Take 30 minutes before breakfast 90 tablet 3  . rosuvastatin (CRESTOR) 20 MG tablet Take 10 mg by mouth daily.     [name] Kitchen topiramate (TOPAMAX) 50 MG tablet Take 1 tablet (50 mg total) by mouth 2 (two) times daily. 180 tablet 3  . traZODone (DESYREL) 100 MG tablet Take 4 tablets (400 mg total) by mouth at bedtime. 120 tablet 4  . polyethylene glycol-electrolytes (NULYTELY/GOLYTELY) 420 g solution As directed (Patient not taking: Reported on 09/18/2019) 4000 mL 0   No current facility-administered medications for this visit.    Allergies as of 09/18/2019 - Review Complete 09/18/2019  Allergen Reaction Noted  . Codeine Hives and Swelling 06/07/2011  . Morphine and related Swelling and Rash 01/21/2014    Family History  Problem Relation Age of Onset  . Anxiety disorder Maternal Grandmother   . Depression Maternal Grandmother   . Colon cancer Neg Hx   . Colon polyps Neg Hx     Social History   Socioeconomic History  . Marital status: Married    Spouse name: Not on file  . Number of children: 0  . Years of education: college  . Highest education level: Not on file  Occupational History  . Occupation: disabled  Tobacco Use  . Smoking status: Never Smoker  . Smokeless tobacco: Never Used  Substance and Sexual Activity  . Alcohol use: No  . Drug use: No  . Sexual activity: Yes    Partners: Male    Birth control/protection: Pill  Other Topics Concern  . Not on file  Social History Narrative   Patient lives at home alone    Patient is right handed   Patient drinks soda's daily   Social Determinants of Health   Financial Resource Strain:   . Difficulty of Paying Living Expenses: Not on file  Food Insecurity:   . Worried About Programme researcher, broadcasting/film/video in the Last Year: Not on file    . Ran Out of Food in the Last Year: Not on file  Transportation Needs:   . Lack of Transportation (Medical): Not on file  . Lack of Transportation (Non-Medical): Not on file  Physical Activity:   . Days of Exercise per Week: Not on file  . Minutes of Exercise per Session: Not on file  Stress:   . Feeling of Stress : Not on file  Social Connections:   . Frequency of Communication with Friends and Family: Not on file  . Frequency of Social Gatherings with Friends and Family: Not on file  .  Attends Religious Services: Not on file  . Active Member of Clubs or Organizations: Not on file  . Attends Banker Meetings: Not on file  . Marital Status: Not on file    Review of Systems: Gen: see HPI CV: Denies chest pain, palpitations, syncope, peripheral edema, and claudication. Resp: Denies dyspnea at rest, cough, wheezing, coughing up blood, and pleurisy. GI: see HPI Derm: Denies rash, itching, dry skin Psych: Denies depression, anxiety, memory loss, confusion. No homicidal or suicidal ideation.  Heme: Denies bruising, bleeding, and enlarged lymph nodes.  Physical Exam: BP 124/85   Pulse (!) 101   Temp (!) 97.3 F (36.3 C) (Temporal)   Ht 5\' 2"  (1.575 m)   Wt 248 lb 3.2 oz (112.6 kg)   BMI 45.40 kg/m  General:   Alert and oriented. No distress noted. Pleasant and cooperative.  Head:  Normocephalic and atraumatic. Eyes:  Conjuctiva clear without scleral icterus. Abdomen:  +BS, soft, non-tender and non-distended. No rebound or guarding. No HSM or masses noted. Msk:  Symmetrical without gross deformities. Normal posture. Extremities:  Without edema. Neurologic:  Alert and  oriented x4 Psych:  Alert and cooperative. Normal mood and affect.  ASSESSMENT: Jamie [name] is a 49 y.o. female presenting today with history of chronic diarrhea, nausea, and GERD, recently undergoing colonoscopy in interim from last visit.  Chronic diarrhea: negative stool studies,  colonoscopy with biopsy unrevealing, noting some improvement with Bentyl but still with nocturnal incontinence episodes. Needs to trial off metformin, will continue Bentyl, and add pancreatic enzymes 2 capsules with meals and 1 with snacks. Noting upper abdominal pain unrelated to diarrhea. Will pursue CT in near future. May need EGD with small bowel biopsies.   GERD: improvement with Protonix. Nausea improved as well. With upper abdominal pain, will likely need EGD if CT unrevealing.    PLAN:   Continue Protonix daily  CT abd/pelvis ordered  Trial off metformin  Pancreatic enzymes 2 with meals and 1 with snacks  May need EGD  Continue dicyclomine  58, PhD, [facility] Gastroenterology

## 2019-09-18 NOTE — Telephone Encounter (Signed)
Fabulous! Noted.

## 2019-09-19 ENCOUNTER — Ambulatory Visit (HOSPITAL_COMMUNITY)
Admission: RE | Admit: 2019-09-19 | Discharge: 2019-09-19 | Disposition: A | Discharge status: Home / Self Care | Payer: Medicare Other | Source: Ambulatory Visit | Attending: Gastroenterology | Admitting: Gastroenterology

## 2019-09-19 DIAGNOSIS — E782 Mixed hyperlipidemia: Secondary | ICD-10-CM | POA: Diagnosis not present

## 2019-09-19 DIAGNOSIS — R1013 Epigastric pain: Secondary | ICD-10-CM | POA: Insufficient documentation

## 2019-09-19 DIAGNOSIS — M545 Low back pain: Secondary | ICD-10-CM | POA: Diagnosis not present

## 2019-09-19 DIAGNOSIS — I10 Essential (primary) hypertension: Secondary | ICD-10-CM | POA: Diagnosis not present

## 2019-09-19 DIAGNOSIS — R109 Unspecified abdominal pain: Secondary | ICD-10-CM | POA: Diagnosis not present

## 2019-09-19 DIAGNOSIS — K219 Gastro-esophageal reflux disease without esophagitis: Secondary | ICD-10-CM | POA: Diagnosis not present

## 2019-09-19 DIAGNOSIS — R197 Diarrhea, unspecified: Secondary | ICD-10-CM | POA: Diagnosis not present

## 2019-09-19 DIAGNOSIS — G8929 Other chronic pain: Secondary | ICD-10-CM | POA: Insufficient documentation

## 2019-09-19 LAB — POCT I-STAT CREATININE: Creatinine, Ser: 0.9 mg/dL (ref 0.44–1.00)

## 2019-09-19 MED ORDER — IOHEXOL 300 MG/ML  SOLN
100.0000 mL | Freq: Once | INTRAMUSCULAR | Status: AC | PRN
  Administered 2019-09-19: 100 mL via INTRAVENOUS

## 2019-09-20 ENCOUNTER — Telehealth: Payer: Self-pay | Admitting: Internal Medicine

## 2019-09-20 NOTE — Telephone Encounter (Signed)
Pt was given results. See result notes.  

## 2019-09-20 NOTE — Telephone Encounter (Signed)
AB pt is inquiring about CT results. Pt is aware that we will discuss results when reviewed by AB.

## 2019-09-20 NOTE — Telephone Encounter (Signed)
See result note.  

## 2019-09-20 NOTE — Telephone Encounter (Signed)
Pt seen her CT results on Mychart and doesn't understand it. Please call her at [phone]

## 2019-09-21 DIAGNOSIS — M47816 Spondylosis without myelopathy or radiculopathy, lumbar region: Secondary | ICD-10-CM | POA: Diagnosis not present

## 2019-09-21 DIAGNOSIS — I10 Essential (primary) hypertension: Secondary | ICD-10-CM | POA: Diagnosis not present

## 2019-09-21 DIAGNOSIS — E7849 Other hyperlipidemia: Secondary | ICD-10-CM | POA: Diagnosis not present

## 2019-09-24 ENCOUNTER — Telehealth: Payer: Self-pay | Admitting: *Deleted

## 2019-09-24 ENCOUNTER — Encounter: Payer: Self-pay | Admitting: *Deleted

## 2019-09-24 ENCOUNTER — Other Ambulatory Visit: Payer: Self-pay | Admitting: *Deleted

## 2019-09-24 DIAGNOSIS — K529 Noninfective gastroenteritis and colitis, unspecified: Secondary | ICD-10-CM

## 2019-09-24 DIAGNOSIS — G8929 Other chronic pain: Secondary | ICD-10-CM

## 2019-09-24 NOTE — Telephone Encounter (Signed)
Checked [facility] website and no PA is required for MRI pelvis w w/o contrast.

## 2019-09-24 NOTE — Telephone Encounter (Signed)
PA for EGD approved via [facility] website. Auth# [id number] Dates 12/10/2019-03/09/2020

## 2019-10-04 ENCOUNTER — Ambulatory Visit: Payer: Medicare Other | Admitting: Adult Health

## 2019-10-11 ENCOUNTER — Other Ambulatory Visit (HOSPITAL_COMMUNITY): Payer: Self-pay | Admitting: *Deleted

## 2019-10-12 ENCOUNTER — Other Ambulatory Visit (HOSPITAL_COMMUNITY)
Admission: RE | Admit: 2019-10-12 | Discharge: 2019-10-12 | Disposition: A | Discharge status: Home / Self Care | Payer: Medicare Other | Source: Ambulatory Visit | Attending: Pulmonary Disease | Admitting: Pulmonary Disease

## 2019-10-12 ENCOUNTER — Other Ambulatory Visit: Payer: Self-pay

## 2019-10-12 DIAGNOSIS — Z01812 Encounter for preprocedural laboratory examination: Secondary | ICD-10-CM | POA: Insufficient documentation

## 2019-10-12 DIAGNOSIS — Z20822 Contact with and (suspected) exposure to covid-19: Secondary | ICD-10-CM | POA: Insufficient documentation

## 2019-10-13 LAB — SARS CORONAVIRUS 2 (TAT 6-24 HRS): SARS Coronavirus 2: NEGATIVE

## 2019-10-16 ENCOUNTER — Ambulatory Visit (HOSPITAL_COMMUNITY)
Admission: RE | Admit: 2019-10-16 | Discharge: 2019-10-16 | Disposition: A | Discharge status: Home / Self Care | Payer: Medicare Other | Source: Ambulatory Visit | Attending: Gastroenterology | Admitting: Gastroenterology

## 2019-10-16 ENCOUNTER — Other Ambulatory Visit: Payer: Self-pay

## 2019-10-16 ENCOUNTER — Ambulatory Visit: Payer: Medicare Other | Admitting: Pulmonary Disease

## 2019-10-16 DIAGNOSIS — R1013 Epigastric pain: Secondary | ICD-10-CM | POA: Diagnosis not present

## 2019-10-16 DIAGNOSIS — G8929 Other chronic pain: Secondary | ICD-10-CM | POA: Diagnosis not present

## 2019-10-16 DIAGNOSIS — K603 Anal fistula: Secondary | ICD-10-CM | POA: Diagnosis not present

## 2019-10-16 DIAGNOSIS — K529 Noninfective gastroenteritis and colitis, unspecified: Secondary | ICD-10-CM | POA: Diagnosis not present

## 2019-10-16 DIAGNOSIS — R1030 Lower abdominal pain, unspecified: Secondary | ICD-10-CM | POA: Diagnosis not present

## 2019-10-16 MED ORDER — GADOBUTROL 1 MMOL/ML IV SOLN
10.0000 mL | Freq: Once | INTRAVENOUS | Status: AC | PRN
  Administered 2019-10-16: 10 mL via INTRAVENOUS

## 2019-10-17 ENCOUNTER — Ambulatory Visit: Payer: Medicare Other | Admitting: Gastroenterology

## 2019-10-17 ENCOUNTER — Telehealth: Payer: Self-pay | Admitting: Internal Medicine

## 2019-10-17 NOTE — Telephone Encounter (Signed)
Pt is reading her results of her MRI on MyChart and wants the nurse to explain it to her. [phone]

## 2019-10-18 NOTE — Telephone Encounter (Signed)
Spoke with pt. Pt was notified of results. 

## 2019-10-24 ENCOUNTER — Ambulatory Visit (INDEPENDENT_AMBULATORY_CARE_PROVIDER_SITE_OTHER): Payer: Medicare Other | Admitting: Psychiatry

## 2019-10-24 ENCOUNTER — Telehealth: Payer: Self-pay | Admitting: Internal Medicine

## 2019-10-24 ENCOUNTER — Other Ambulatory Visit: Payer: Self-pay

## 2019-10-24 DIAGNOSIS — F329 Major depressive disorder, single episode, unspecified: Secondary | ICD-10-CM

## 2019-10-24 DIAGNOSIS — K219 Gastro-esophageal reflux disease without esophagitis: Secondary | ICD-10-CM | POA: Diagnosis not present

## 2019-10-24 DIAGNOSIS — I10 Essential (primary) hypertension: Secondary | ICD-10-CM | POA: Diagnosis not present

## 2019-10-24 DIAGNOSIS — F330 Major depressive disorder, recurrent, mild: Secondary | ICD-10-CM

## 2019-10-24 DIAGNOSIS — F323 Major depressive disorder, single episode, severe with psychotic features: Secondary | ICD-10-CM

## 2019-10-24 DIAGNOSIS — K603 Anal fistula: Secondary | ICD-10-CM

## 2019-10-24 MED ORDER — BUPROPION HCL ER (XL) 150 MG PO TB24: 450.0000 mg | ORAL_TABLET | Freq: Every day | ORAL | 5 refills | Status: DC

## 2019-10-24 MED ORDER — DULOXETINE HCL 60 MG PO CPEP: 120.0000 mg | ORAL_CAPSULE | Freq: Every day | ORAL | 4 refills | Status: DC

## 2019-10-24 MED ORDER — CLONAZEPAM 1 MG PO TABS: 1.0000 mg | ORAL_TABLET | ORAL | 4 refills | Status: DC

## 2019-10-24 MED ORDER — ARIPIPRAZOLE 10 MG PO TABS: 10.0000 mg | ORAL_TABLET | Freq: Every day | ORAL | 5 refills | Status: DC

## 2019-10-24 NOTE — Telephone Encounter (Signed)
Did not receive any orders for surgery referral. Please advise AB thanks

## 2019-10-24 NOTE — Telephone Encounter (Signed)
PATIENT CALLED STATING SHE WAS SUPPOSED TO BE REFERRED TO A SURGEON AND SHE HAS NOT HEARD ANYTHING YET.   PLEASE ADVISE

## 2019-10-24 NOTE — Progress Notes (Signed)
Patient ID: Jamie [name], female   DOB: 1970-12-24, 49 y.o.   MRN: [id number] [facility] MD Progress Note  10/24/2019 3:23 PM Jamie [name]  MRN:  [id number] Subjective:  Feeling well. Principal Problem: Major depression recurrent  Today the patient is doing fairly well.  Her mood is stable.  Unfortunately by CAT scan they found an abdominal mass.  She is been having diarrhea which is better now.  Unfortunately she has lost weight.  She is of course frightened this might be cancer.  She recently had an MRI and is waiting the results.  Generally she is sleeping it fairly well.  Her appetite is fair.  Her anxiety is mild.  She denies daily depression.  She has no psychotic symptoms.  She stays active as she can.  She is obviously on edge.  Her family situation is the same.  The only difference is that he seemed to be a little quieter around her.  The seem to respect her just somewhat more.  The patient takes her medicines just as prescribed.  She denies chest pain or shortness of breath.  Past Medical History:  Diagnosis Date  . Anxiety   . Arthritis   . Bipolar 1 disorder (HCC)   . Complication of anesthesia   . Depression   . Diabetes (HCC)   . GERD (gastroesophageal reflux disease)   . H/O hiatal hernia   . Headache(784.0)   . High cholesterol   . Hypertension   . PCOS (polycystic ovarian syndrome)   . PONV (postoperative nausea and vomiting)    nausea  . Sleep apnea    CPAP    Past Surgical History:  Procedure Laterality Date  . ANAL FISSURE REPAIR     x 3  . BACK SURGERY  2009   x2  . BIOPSY  09/06/2019   Procedure: BIOPSY;  Surgeon: [name], MD;  Location: [facility];  Service: Endoscopy;;  . CHOLECYSTECTOMY  07/2002  . COLONOSCOPY WITH PROPOFOL [name] 09/06/2019   pancolonic diverticulosis. Segmental biopsy completed with benign biopsies.   [name] Kitchen KNEE ARTHROSCOPY Right   . LUMBAR LAMINECTOMY/DECOMPRESSION MICRODISCECTOMY Right 01/22/2014   Procedure: LUMBAR FOUR TO FIVE  LUMBAR LAMINECTOMY/DECOMPRESSION MICRODISCECTOMY 1 LEVEL;  Surgeon: Karn Cassis, MD;  Location: [facility];  Service: Neurosurgery;  Laterality: Right;  Right L45 diskectomy   Family History:  Family History  Problem Relation Age of Onset  . Anxiety disorder Maternal Grandmother   . Depression Maternal Grandmother   . Colon cancer Neg Hx   . Colon polyps Neg Hx    Family Psychiatric  History:  Social History:  Social History   Substance and Sexual Activity  Alcohol Use No     Social History   Substance and Sexual Activity  Drug Use No    Social History   Socioeconomic History  . Marital status: Married    Spouse name: Not on file  . Number of children: 0  . Years of education: college  . Highest education level: Not on file  Occupational History  . Occupation: disabled  Tobacco Use  . Smoking status: Never Smoker  . Smokeless tobacco: Never Used  Substance and Sexual Activity  . Alcohol use: No  . Drug use: No  . Sexual activity: Yes    Partners: Male    Birth control/protection: Pill  Other Topics Concern  . Not on file  Social History Narrative   Patient lives at home alone    Patient  is right handed   Patient drinks soda's daily   Social Determinants of Health   Financial Resource Strain:   . Difficulty of Paying Living Expenses:   Food Insecurity:   . Worried About Programme researcher, broadcasting/film/video in the Last Year:   . Barista in the Last Year:   Transportation Needs:   . Freight forwarder (Medical):   [name] Kitchen Lack of Transportation (Non-Medical):   Physical Activity:   . Days of Exercise per Week:   . Minutes of Exercise per Session:   Stress:   . Feeling of Stress :   Social Connections:   . Frequency of Communication with Friends and Family:   . Frequency of Social Gatherings with Friends and Family:   . Attends Religious Services:   . Active Member of Clubs or Organizations:   . Attends Banker Meetings:   [name] Kitchen Marital Status:     Additional Social History:                         Sleep: Fair  Appetite:  Good  Current Medications: Current Outpatient Medications  Medication Sig Dispense Refill  . albuterol (VENTOLIN HFA) 108 (90 Base) MCG/ACT inhaler Inhale 2 puffs into the lungs every 6 (six) hours as needed for wheezing or shortness of breath.    . ARIPiprazole (ABILIFY) 10 MG tablet Take 1 tablet (10 mg total) by mouth daily. 30 tablet 5  . benzonatate (TESSALON) 200 MG capsule Take 1 capsule (200 mg total) by mouth 3 (three) times daily as needed for cough. 60 capsule 1  . buPROPion (WELLBUTRIN XL) 150 MG 24 hr tablet Take 3 tablets (450 mg total) by mouth daily. 90 tablet 5  . clonazePAM (KLONOPIN) 1 MG tablet Take 1-2 tablets (1-2 mg total) by mouth See admin instructions. Take 1 mg in the morning and 2 mg at night 90 tablet 4  . dicyclomine (BENTYL) 10 MG capsule Take 1 capsule (10 mg total) by mouth 4 (four) times daily -  before meals and at bedtime. 120 capsule 3  . DULoxetine (CYMBALTA) 60 MG capsule Take 2 capsules (120 mg total) by mouth at bedtime. 60 capsule 4  . fluticasone (FLONASE) 50 MCG/ACT nasal spray Place 1 spray into both nostrils daily as needed for allergies or rhinitis.     [name] Kitchen lisinopril (PRINIVIL,ZESTRIL) 5 MG tablet Take 5 mg by mouth daily.    . Menthol, Topical Analgesic, (BIOFREEZE EX) Apply 1 application topically daily as needed (back pain).    . metFORMIN (GLUCOPHAGE-XR) 500 MG 24 hr tablet Take 2,000 mg by mouth daily.    . ondansetron (ZOFRAN-ODT) 4 MG disintegrating tablet TAKE 1 TABLET BY MOUTH EVERY 8 HOURS AS NEEDED FOR NAUSEA AND VOMITING 40 tablet 3  . pantoprazole (PROTONIX) 40 MG tablet Take 1 tablet (40 mg total) by mouth daily. Take 30 minutes before breakfast 90 tablet 3  . rosuvastatin (CRESTOR) 20 MG tablet Take 10 mg by mouth daily.     [name] Kitchen topiramate (TOPAMAX) 50 MG tablet Take 1 tablet (50 mg total) by mouth 2 (two) times daily. 180 tablet 3  . traZODone  (DESYREL) 100 MG tablet Take 4 tablets (400 mg total) by mouth at bedtime. 120 tablet 4   No current facility-administered medications for this visit.    Lab Results: No results found for this or any previous visit (from the past 48 hour(s)).  Physical Findings: AIMS:  , ,  ,  ,  CIWA:    COWS:     Musculoskeletal: Strength & Muscle Tone: within normal limits Gait & Station: normal Patient leans: Right  Psychiatric Specialty Exam: ROS  There were no vitals taken for this visit.There is no height or weight on file to calculate BMI.  General Appearance: Casual  Eye Contact::  Good  Speech:  Clear and Coherent  Volume:  Normal  Mood:Depressed  Affect:  Appropriate  Today the patient is doing very well. Thought Process:  Coherent  Orientation:  Full (Time, Place, and Person)  Thought Content:  NA and WDL  Suicidal Thoughts:  yes  Homicidal Thoughts:  No  Memory:  Immediate;   NA  Judgement:  Good  Insight:  Good  Psychomotor Activity:  NA  Concentration:  Good  Recall:  Good  Fund of Knowledge:Good  Language: Good  Akathisia:  No  Handed:  Right  AIMS (if indicated):     Assets:  Desire for Improvement  ADL's:  Intact  Cognition: WNL  Sleep:      Treatment Plan Summary: 10/24/2019, 3:23 PM This patient's first problem is that of major depression.  The patient takes Cymbalta 120 mg and Wellbutrin 450 mg.  She also is taking Abilify.  Patient is on Klonopin 1 mg 1 in the morning and 2 at night.  Patient is relatively well controlled on these medications.  She is functioning fair.  She will be seen again in 2 to 3 months.

## 2019-10-29 NOTE — Telephone Encounter (Signed)
I apologize, I thought the result note had been routed that was already completed. I added to it. Needs surgery referral for perianal fistula.

## 2019-10-29 NOTE — Addendum Note (Signed)
Addended by: Armstead Peaks on: 10/29/2019 01:46 PM   Modules accepted: Orders

## 2019-10-29 NOTE — Telephone Encounter (Signed)
Referral sent to [facility] 

## 2019-11-06 DIAGNOSIS — K603 Anal fistula: Secondary | ICD-10-CM | POA: Diagnosis not present

## 2019-11-06 NOTE — Telephone Encounter (Signed)
Patient called in as an FYI she is going to see [facility] tomorrow morning.

## 2019-11-07 ENCOUNTER — Ambulatory Visit: Payer: Self-pay | Admitting: Surgery

## 2019-11-07 DIAGNOSIS — K644 Residual hemorrhoidal skin tags: Secondary | ICD-10-CM | POA: Diagnosis not present

## 2019-11-07 DIAGNOSIS — K529 Noninfective gastroenteritis and colitis, unspecified: Secondary | ICD-10-CM | POA: Diagnosis not present

## 2019-11-07 DIAGNOSIS — K641 Second degree hemorrhoids: Secondary | ICD-10-CM | POA: Diagnosis not present

## 2019-11-07 DIAGNOSIS — Z8719 Personal history of other diseases of the digestive system: Secondary | ICD-10-CM | POA: Diagnosis not present

## 2019-11-15 DIAGNOSIS — K219 Gastro-esophageal reflux disease without esophagitis: Secondary | ICD-10-CM | POA: Diagnosis not present

## 2019-11-15 DIAGNOSIS — I10 Essential (primary) hypertension: Secondary | ICD-10-CM | POA: Diagnosis not present

## 2019-11-19 ENCOUNTER — Ambulatory Visit: Payer: Self-pay | Admitting: Surgery

## 2019-11-19 DIAGNOSIS — K644 Residual hemorrhoidal skin tags: Secondary | ICD-10-CM | POA: Diagnosis not present

## 2019-11-19 DIAGNOSIS — K641 Second degree hemorrhoids: Secondary | ICD-10-CM | POA: Diagnosis not present

## 2019-11-19 DIAGNOSIS — Z8719 Personal history of other diseases of the digestive system: Secondary | ICD-10-CM | POA: Diagnosis not present

## 2019-11-19 DIAGNOSIS — K529 Noninfective gastroenteritis and colitis, unspecified: Secondary | ICD-10-CM | POA: Diagnosis not present

## 2019-11-19 NOTE — H&P (View-Only) (Signed)
Jamie [name] Appointment: 11/19/2019 10:45 AM Location: [facility] Patient #: [id number] DOB: 05/14/1971 Married / Language: [place] / Race: White Female  History of Present Illness [name] MD; 11/19/2019 11:08 AM) The patient is a 49 year old female who presents with anal fistula. Note for "Anal fistula": ` ` ` Patient sent for surgical consultation at the request of Dr Eula Listen / Lewie Loron, NP  Chief Complaint: Anal pain and drainage. Probable fistula. ` ` Patient returns 2 weeks after banding. She notes she got little relief with that. Still having some irritation and bleeding. She noted the bands passed rather quickly. She maintains her bowel movements and a more normal range. No flares. She is disappointed it did not work but does not want to stay like this in comes in to discuss other options such as surgery. She denies any fevers chills or sweats. No control bleeding. No purulent drainage. No moisture nor leaking.  PRIOR NOTE: The patient is a patient struggle with loose bowel movements and probable irritable bowel. History of anorectal surgery in 2008 by Dr. Gabriel Cirri. She thought it was a fissure but I'm wondering if it was an anal fistula repair and the way she describes needing a deeper incision and an open wound on her left side that took a while to close down. History of cholecystectomy by Dr. Lovell Sheehan 15 years ago. Followed by Dr.Rourk with [facility].   Patient noted that she was having worsening diarrhea last fall. Had negative stool studies. Underwent colonoscopy that showed some diverticulosis but was otherwise underwhelming. Biopsy showing no inflammation or colitis. Has had some anal pain. CT scan done and MRI confirming suspicion for left-sided perirectal fistula. Surgical consultation requested. Patient notes that since she's been on Bentyl and gotten off metformin, her diarrhea has slowed down markedly.  She is moving her bowels once or twice a day.  She does have issues of irritation after bowel movements. No major bleeding. No incontinence. No chronic drainage. Not pain wear a pad. She's not been on any antibiotic regimens. She is not needed any other anorectal surgery since 2008. Her Crohn's or colitis. No dysphagia to solids or liquids. No family history of any gastrology issues. About 10 minutes before she has to stop. Struggles with chronic low back pain and uses narcotics occasionally.  (Review of systems as stated in this history (HPI) or in the review of systems. Otherwise all other 12 point ROS are negative) ` ` `  This patient encounter took 50 minutes today to perform the following: obtain history, perform exam, review outside records, interpret tests & imaging, counsel the patient on their diagnosis; and, document this encounter, including findings & plan in the electronic health record (EHR).   Problem List/Past Medical [name], MD; 11/19/2019 10:39 AM) CHRONIC DIARRHEA (K52.9) HISTORY OF ANAL FISSURES (Z87.19) EXTERNAL HEMORRHOIDS WITH COMPLICATION (K64.4) PROLAPSED INTERNAL HEMORRHOIDS, GRADE 2 (K64.1)  Past Surgical History ([name], RMA; 11/19/2019 10:27 AM) Anal Fissure Repair Gallbladder Surgery - Laparoscopic Knee Surgery Right. Spinal Surgery - Lower Back  Diagnostic Studies History ([name], RMA; 11/19/2019 10:27 AM) Colonoscopy within last year Mammogram within last year Pap Smear 1-5 years ago  Allergies ([name], RMA; 11/19/2019 10:27 AM) Codeine Phosphate *ANALGESICS - OPIOID* Morphine Sulfate (Concentrate) *ANALGESICS - OPIOID* Allergies Reconciled  Medication History ([name], RMA; 11/19/2019 10:27 AM) Albuterol (90MCG/ACT Aerosol Soln, Inhalation) Active. ARIPiprazole (10MG  Tablet, Oral) Active. Benzonatate (200MG  Capsule, Oral) Active. clonazePAM (  1MG  Tablet, Oral)  Active. Dicyclomine HCl (10MG  Capsule, Oral) Active. DULoxetine HCl (60MG  Capsule DR Part, Oral) Active. Flonase (50MCG/DOSE Inhaler, Nasal) Active. Lisinopril (5MG  Tablet, Oral) Active. metFORMIN HCl ER (500MG  Tablet ER 24HR, Oral) Active. Ondansetron (4MG  Tablet Disint, Oral) Active. Pantoprazole Sodium (40MG  Tablet DR, Oral) Active. Rosuvastatin Calcium (20MG  Tablet, Oral) Active. Topamax (50MG  Tablet, Oral) Active. traZODone HCl (100MG  Tablet, Oral) Active. Medications Reconciled  Social History (Tanisha A. , RMA; 11/19/2019 10:27 AM) Alcohol use Remotely quit alcohol use. Caffeine use Carbonated beverages. No drug use Tobacco use Never smoker.  Family History (Tanisha A. , RMA; 11/19/2019 10:27 AM) Diabetes Mellitus Father. Heart Disease Father. Migraine Headache Mother, Sister. Respiratory Condition Father.  Pregnancy / Birth History (Tanisha A. , RMA; 11/19/2019 10:27 AM) Age at menarche 13 years. Age of menopause 33-50 Contraceptive History Oral contraceptives. Gravida 0 Irregular periods Para 0  Other Problems , MD; 11/19/2019 10:39 AM) Anxiety Disorder Back Pain Depression Diabetes Mellitus Diverticulosis Gastroesophageal Reflux Disease Hemorrhoids High blood pressure Hypercholesterolemia Migraine Headache Other disease, cancer, significant illness Sleep Apnea     Review of Systems ([name] RMA; 11/19/2019 10:27 AM) General Present- Fatigue. Not Present- Appetite Loss, Chills, Fever, Night Sweats, Weight Gain and Weight Loss. Skin Present- Dryness. Not Present- Change in Wart/Mole, Hives, Jaundice, New Lesions, Non-Healing Wounds, Rash and Ulcer. HEENT Not Present- Earache, Hearing Loss, Hoarseness, Nose Bleed, Oral Ulcers, Ringing in the Ears, Seasonal Allergies, Sinus Pain, Sore Throat, Visual Disturbances, Wears glasses/contact lenses and Yellow Eyes. Respiratory Not Present-  Bloody sputum, Chronic Cough, Difficulty Breathing, Snoring and Wheezing. Breast Not Present- Breast Mass, Breast Pain, Nipple Discharge and Skin Changes. Cardiovascular Not Present- Chest Pain, Difficulty Breathing Lying Down, Leg Cramps, Palpitations, Rapid Heart Rate, Shortness of Breath and Swelling of Extremities. Gastrointestinal Present- Abdominal Pain, Excessive gas, Hemorrhoids and Nausea. Not Present- Bloating, Bloody Stool, Change in Bowel Habits, Chronic diarrhea, Constipation, Difficulty Swallowing, Gets full quickly at meals, Indigestion, Rectal Pain and Vomiting. Female Genitourinary Present- Pelvic Pain. Not Present- Frequency, Nocturia, Painful Urination and Urgency. Musculoskeletal Present- Back Pain, Muscle Pain and Muscle Weakness. Not Present- Joint Pain, Joint Stiffness and Swelling of Extremities. Neurological Present- Weakness. Not Present- Decreased Memory, Fainting, Headaches, Numbness, Seizures, Tingling, Tremor and Trouble walking. Endocrine Present- Heat Intolerance and New Diabetes. Not Present- Cold Intolerance, Excessive Hunger, Hair Changes and Hot flashes. Hematology Not Present- Blood Thinners, Easy Bruising, Excessive bleeding, Gland problems, HIV and Persistent Infections.  Vitals ([name] RMA; 11/19/2019 10:27 AM) 11/19/2019 10:27 AM Weight: 250.6 lb Height: 63in Body Surface Area: 2.13 m Body Mass Index: 44.39 kg/m  Temp.: 98.33F  Pulse: 116 (Regular)  BP: 132/86(Sitting, Left Arm, Standard)        Physical Exam Manson Passey MD; 11/19/2019 10:56 AM)  General Mental Status-Alert. General Appearance-Not in acute distress, Not Sickly. Orientation-Oriented X3. Hydration-Well hydrated. Voice-Normal.  Integumentary Global Assessment Upon inspection and palpation of skin surfaces of the - Axillae: non-tender, no inflammation or ulceration, no drainage. and Distribution of scalp and body hair is normal. General  Characteristics Temperature - normal warmth is noted.  Head and Neck Head-normocephalic, atraumatic with no lesions or palpable masses. Face Global Assessment - atraumatic, no absence of expression. Neck Global Assessment - no abnormal movements, no bruit auscultated on the right, no bruit auscultated on the left, no decreased range of motion, non-tender. Trachea-midline. Thyroid Gland Characteristics - non-tender.  Eye Eyeball - Left-Extraocular movements intact, No Nystagmus - Left. Eyeball -  Right-Extraocular movements intact, No Nystagmus - Right. Cornea - Left-No Hazy - Left. Cornea - Right-No Hazy - Right. Sclera/Conjunctiva - Left-No scleral icterus, No Discharge - Left. Sclera/Conjunctiva - Right-No scleral icterus, No Discharge - Right. Pupil - Left-Direct reaction to light normal. Pupil - Right-Direct reaction to light normal.  ENMT Ears Pinna - Left - no drainage observed, no generalized tenderness observed. Pinna - Right - no drainage observed, no generalized tenderness observed. Nose and Sinuses External Inspection of the Nose - no destructive lesion observed. Inspection of the nares - Left - quiet respiration. Inspection of the nares - Right - quiet respiration. Mouth and Throat Lips - Upper Lip - no fissures observed, no pallor noted. Lower Lip - no fissures observed, no pallor noted. Nasopharynx - no discharge present. Oral Cavity/Oropharynx - Tongue - no dryness observed. Oral Mucosa - no cyanosis observed. Hypopharynx - no evidence of airway distress observed.  Chest and Lung Exam Inspection Movements - Normal and Symmetrical. Accessory muscles - No use of accessory muscles in breathing. Palpation Palpation of the chest reveals - Non-tender. Auscultation Breath sounds - Normal and Clear.  Cardiovascular Auscultation Rhythm - Regular. Murmurs & Other Heart Sounds - Auscultation of the heart reveals - No Murmurs and No Systolic  Clicks.  Abdomen Inspection Inspection of the abdomen reveals - No Visible peristalsis and No Abnormal pulsations. Umbilicus - No Bleeding, No Urine drainage. Palpation/Percussion Palpation and Percussion of the abdomen reveal - Soft, Non Tender, No Rebound tenderness, No Rigidity (guarding) and No Cutaneous hyperesthesia. Note: Abdomen obese but soft. Not severely distended. No diastasis recti. Good hygiene under panniculus. No rash. No umbilical or other anterior abdominal wall hernias  Female Genitourinary Sexual Maturity Tanner 5 - Adult hair pattern. Note: No vaginal bleeding nor discharge  Rectal Note: Some hypopigmentation and thinning. Handily. Most likely evidence of prior pruritus and prior surgery.  She does have some external tags including left posterior. Mild fullness in the soft tissue left posterior midline. However there is no active opening or sinus. I see no active abscess nor fistula.  Perianal skin clean with good hygiene. No active pruritis ani. No pilonidal disease. No fissure. No abscess/fistula. Anal sphincter somewhat stretched out but has good squeeze and otherwise no sphincter defects. No condyloma warts. I held off on internal exam given anoscopy and banding 2 weeks ago and no evidence of any major external issues.  Peripheral Vascular Upper Extremity Inspection - Left - No Cyanotic nailbeds - Left, Not Ischemic. Inspection - Right - No Cyanotic nailbeds - Right, Not Ischemic.  Neurologic Neurologic evaluation reveals -normal attention span and ability to concentrate, able to name objects and repeat phrases. Appropriate fund of knowledge , normal sensation and normal coordination. Mental Status Affect - not angry, not paranoid. Cranial Nerves-Normal Bilaterally. Gait-Normal.  Neuropsychiatric Mental status exam performed with findings of-able to articulate well with normal speech/language, rate, volume and coherence, thought  content normal with ability to perform basic computations and apply abstract reasoning and no evidence of hallucinations, delusions, obsessions or homicidal/suicidal ideation.  Musculoskeletal Global Assessment Spine, Ribs and Pelvis - no instability, subluxation or laxity. Right Upper Extremity - no instability, subluxation or laxity. Note: Moderate kyphosis/buffalo hump. Mild tenderness lumbar spine  Lymphatic Head & Neck General Head & Neck Lymphatics: Bilateral - Description - No Localized lymphadenopathy. Axillary General Axillary Region: Bilateral - Description - No Localized lymphadenopathy. Femoral & Inguinal Generalized Femoral & Inguinal Lymphatics: Left - Description - No Localized lymphadenopathy. Right - Description -  No Localized lymphadenopathy.    Assessment & Plan [name] MD; 11/19/2019 11:02 AM)  PROLAPSED INTERNAL HEMORRHOIDS, GRADE 2 (K64.1) Impression: Patient with persistent hemorrhoidal discomfort with burning and irritation. No durable improvement with hemorrhoidal banding 2. This point recommend examination under anesthesia with hemorrhoidal ligation/pexy him possible hemorrhoidectomies. Consider removing some of the external tags. Make sure there are no other issues going on. She is leaning towards that since banding did not help.  Current Plans You are being scheduled for surgery- Our schedulers will call you.  You should hear from our office's scheduling department within 5 working days about the location, date, and time of surgery. We try to make accommodations for patient's preferences in scheduling surgery, but sometimes the OR schedule or the surgeon's schedule prevents [place] from making those accommodations.  If you have not heard from our office [phone]) in 5 working days, call the office and ask for your surgeon's nurse.  If you have other questions about your diagnosis, plan, or surgery, call the office and ask for your surgeon's  nurse.  The anatomy & physiology of the anorectal region was discussed. The pathophysiology of hemorrhoids and differential diagnosis was discussed. Natural history risks without surgery was discussed. I stressed the importance of a bowel regimen to have daily soft bowel movements to minimize progression of disease. Interventions such as sclerotherapy & banding were discussed.  The patient's symptoms are not adequately controlled by medicines and other non-operative treatments. I feel the risks & problems of no surgery outweigh the operative risks; therefore, I recommended surgery to treat the hemorrhoids by ligation, pexy, and possible resection.  Risks such as bleeding, infection, urinary difficulties, need for further treatment, heart attack, death, and other risks were discussed. I noted a good likelihood this will help address the problem. Goals of post-operative recovery were discussed as well. Possibility that this will not correct all symptoms was explained. Post-operative pain, bleeding, constipation, and other problems after surgery were discussed. We will work to minimize complications. Educational handouts further explaining the pathology, treatment options, and bowel regimen were given as well. Questions were answered. The patient expresses understanding & wishes to proceed with surgery.   EXTERNAL HEMORRHOIDS WITH COMPLICATION (K64.4) Impression: She does have a few external hemorrhoids but they're not particularly inflammatory. Should banding at work and proceed to surgery, can try and excise at the time surgery. Trying hold off for now given her prior anorectal surgery  Current Plans Pt Education - [facility] Hemorrhoids (Lokelani Lutes): discussed with patient and provided information.  HISTORY OF ANAL FISSURES (Z87.19) Impression: Some questionable history of anal fissures. I suspect she had an anal fistula that was repaired in 2008 and we are seeing is the scarring from that. I  detect no active opening or sinus. She gives me no symptoms of chronic drainage or pain. In reviewing the films I do see some scarring that correlates with the left posterior region but it is subtle at best. I suspect they're seeing evidence of an old fistula repair.  We will obtain operative records from 2008 to see what exactly was done.  If she truly has a fistula, she will get recurrent symptoms. Pleasant case, I can offer examination under anesthesia in the operating room with possible more aggressive evaluation. However feeling get a good exam today and is improved since her severe diarrhea is now under control.   CHRONIC DIARRHEA (K52.9) Impression: Chronic diarrhea of noninfectious or inflammatory etiology seems to be better control of bowel regimen  and Bentyl under the care of gastroenterology. I noted this is extremely important to avoid future diarrhea which will flare and hemorrhoids aren't risk recurrent infection/fistula   OBESITY, MORBID, BMI 40.0-49.9 (E66.01)   OSA ON CPAP (G47.33) Impression: Compliant on CPAP at night.  Yoshiye Kraft C. Diamone Whistler, MD, FACS, MASCRS Gastrointestinal and Minimally Invasive Surgery  [facility] [address], [address] [place], [place] [address] [phone] Main / Paging [phone] Fax     

## 2019-11-19 NOTE — H&P (Signed)
Jamie [name] Appointment: 11/19/2019 10:45 AM Location: [facility] Patient #: [id number] DOB: 05/14/1971 Married / Language: [place] / Race: White Female  History of Present Illness Jamie Sportsman MD; 11/19/2019 11:08 AM) The patient is a 49 year old female who presents with anal fistula. Note for "Anal fistula": ` ` ` Patient sent for surgical consultation at the request of Dr Eula Listen / Lewie Loron, NP  Chief Complaint: Anal pain and drainage. Probable fistula. ` ` Patient returns 2 weeks after banding. She notes she got little relief with that. Still having some irritation and bleeding. She noted the bands passed rather quickly. She maintains her bowel movements and a more normal range. No flares. She is disappointed it did not work but does not want to stay like this in comes in to discuss other options such as surgery. She denies any fevers chills or sweats. No control bleeding. No purulent drainage. No moisture nor leaking.  PRIOR NOTE: The patient is a patient struggle with loose bowel movements and probable irritable bowel. History of anorectal surgery in 2008 by Dr. Gabriel Cirri. She thought it was a fissure but I'm wondering if it was an anal fistula repair and the way she describes needing a deeper incision and an open wound on her left side that took a while to close down. History of cholecystectomy by Dr. Lovell Sheehan 15 years ago. Followed by Dr.Rourk with [facility].   Patient noted that she was having worsening diarrhea last fall. Had negative stool studies. Underwent colonoscopy that showed some diverticulosis but was otherwise underwhelming. Biopsy showing no inflammation or colitis. Has had some anal pain. CT scan done and MRI confirming suspicion for left-sided perirectal fistula. Surgical consultation requested. Patient notes that since she's been on Bentyl and gotten off metformin, her diarrhea has slowed down markedly.  She is moving her bowels once or twice a day.  She does have issues of irritation after bowel movements. No major bleeding. No incontinence. No chronic drainage. Not pain wear a pad. She's not been on any antibiotic regimens. She is not needed any other anorectal surgery since 2008. Her Crohn's or colitis. No dysphagia to solids or liquids. No family history of any gastrology issues. About 10 minutes before she has to stop. Struggles with chronic low back pain and uses narcotics occasionally.  (Review of systems as stated in this history (HPI) or in the review of systems. Otherwise all other 12 point ROS are negative) ` ` `  This patient encounter took 50 minutes today to perform the following: obtain history, perform exam, review outside records, interpret tests & imaging, counsel the patient on their diagnosis; and, document this encounter, including findings & plan in the electronic health record (EHR).   Problem List/Past Medical Jamie Sportsman, MD; 11/19/2019 10:39 AM) CHRONIC DIARRHEA (K52.9) HISTORY OF ANAL FISSURES (Z87.19) EXTERNAL HEMORRHOIDS WITH COMPLICATION (K64.4) PROLAPSED INTERNAL HEMORRHOIDS, GRADE 2 (K64.1)  Past Surgical History ([name], RMA; 11/19/2019 10:27 AM) Anal Fissure Repair Gallbladder Surgery - Laparoscopic Knee Surgery Right. Spinal Surgery - Lower Back  Diagnostic Studies History ([name], RMA; 11/19/2019 10:27 AM) Colonoscopy within last year Mammogram within last year Pap Smear 1-5 years ago  Allergies ([name], RMA; 11/19/2019 10:27 AM) Codeine Phosphate *ANALGESICS - OPIOID* Morphine Sulfate (Concentrate) *ANALGESICS - OPIOID* Allergies Reconciled  Medication History ([name], RMA; 11/19/2019 10:27 AM) Albuterol (90MCG/ACT Aerosol Soln, Inhalation) Active. ARIPiprazole (10MG  Tablet, Oral) Active. Benzonatate (200MG  Capsule, Oral) Active. clonazePAM (  1MG  Tablet, Oral)  Active. Dicyclomine HCl (10MG  Capsule, Oral) Active. DULoxetine HCl (60MG  Capsule DR Part, Oral) Active. Flonase (50MCG/DOSE Inhaler, Nasal) Active. Lisinopril (5MG  Tablet, Oral) Active. metFORMIN HCl ER (500MG  Tablet ER 24HR, Oral) Active. Ondansetron (4MG  Tablet Disint, Oral) Active. Pantoprazole Sodium (40MG  Tablet DR, Oral) Active. Rosuvastatin Calcium (20MG  Tablet, Oral) Active. Topamax (50MG  Tablet, Oral) Active. traZODone HCl (100MG  Tablet, Oral) Active. Medications Reconciled  Social History (Tanisha A. , RMA; 11/19/2019 10:27 AM) Alcohol use Remotely quit alcohol use. Caffeine use Carbonated beverages. No drug use Tobacco use Never smoker.  Family History (Tanisha A. , RMA; 11/19/2019 10:27 AM) Diabetes Mellitus Father. Heart Disease Father. Migraine Headache Mother, Sister. Respiratory Condition Father.  Pregnancy / Birth History (Tanisha A. , RMA; 11/19/2019 10:27 AM) Age at menarche 13 years. Age of menopause 34-50 Contraceptive History Oral contraceptives. Gravida 0 Irregular periods Para 0  Other Problems , MD; 11/19/2019 10:39 AM) Anxiety Disorder Back Pain Depression Diabetes Mellitus Diverticulosis Gastroesophageal Reflux Disease Hemorrhoids High blood pressure Hypercholesterolemia Migraine Headache Other disease, cancer, significant illness Sleep Apnea     Review of Systems ([name] RMA; 11/19/2019 10:27 AM) General Present- Fatigue. Not Present- Appetite Loss, Chills, Fever, Night Sweats, Weight Gain and Weight Loss. Skin Present- Dryness. Not Present- Change in Wart/Mole, Hives, Jaundice, New Lesions, Non-Healing Wounds, Rash and Ulcer. HEENT Not Present- Earache, Hearing Loss, Hoarseness, Nose Bleed, Oral Ulcers, Ringing in the Ears, Seasonal Allergies, Sinus Pain, Sore Throat, Visual Disturbances, Wears glasses/contact lenses and Yellow Eyes. Respiratory Not Present-  Bloody sputum, Chronic Cough, Difficulty Breathing, Snoring and Wheezing. Breast Not Present- Breast Mass, Breast Pain, Nipple Discharge and Skin Changes. Cardiovascular Not Present- Chest Pain, Difficulty Breathing Lying Down, Leg Cramps, Palpitations, Rapid Heart Rate, Shortness of Breath and Swelling of Extremities. Gastrointestinal Present- Abdominal Pain, Excessive gas, Hemorrhoids and Nausea. Not Present- Bloating, Bloody Stool, Change in Bowel Habits, Chronic diarrhea, Constipation, Difficulty Swallowing, Gets full quickly at meals, Indigestion, Rectal Pain and Vomiting. Female Genitourinary Present- Pelvic Pain. Not Present- Frequency, Nocturia, Painful Urination and Urgency. Musculoskeletal Present- Back Pain, Muscle Pain and Muscle Weakness. Not Present- Joint Pain, Joint Stiffness and Swelling of Extremities. Neurological Present- Weakness. Not Present- Decreased Memory, Fainting, Headaches, Numbness, Seizures, Tingling, Tremor and Trouble walking. Endocrine Present- Heat Intolerance and New Diabetes. Not Present- Cold Intolerance, Excessive Hunger, Hair Changes and Hot flashes. Hematology Not Present- Blood Thinners, Easy Bruising, Excessive bleeding, Gland problems, HIV and Persistent Infections.  Vitals ([name] RMA; 11/19/2019 10:27 AM) 11/19/2019 10:27 AM Weight: 250.6 lb Height: 63in Body Surface Area: 2.13 m Body Mass Index: 44.39 kg/m  Temp.: 98.33F  Pulse: 116 (Regular)  BP: 132/86(Sitting, Left Arm, Standard)        Physical Exam Manson Passey MD; 11/19/2019 10:56 AM)  General Mental Status-Alert. General Appearance-Not in acute distress, Not Sickly. Orientation-Oriented X3. Hydration-Well hydrated. Voice-Normal.  Integumentary Global Assessment Upon inspection and palpation of skin surfaces of the - Axillae: non-tender, no inflammation or ulceration, no drainage. and Distribution of scalp and body hair is normal. General  Characteristics Temperature - normal warmth is noted.  Head and Neck Head-normocephalic, atraumatic with no lesions or palpable masses. Face Global Assessment - atraumatic, no absence of expression. Neck Global Assessment - no abnormal movements, no bruit auscultated on the right, no bruit auscultated on the left, no decreased range of motion, non-tender. Trachea-midline. Thyroid Gland Characteristics - non-tender.  Eye Eyeball - Left-Extraocular movements intact, No Nystagmus - Left. Eyeball -  Right-Extraocular movements intact, No Nystagmus - Right. Cornea - Left-No Hazy - Left. Cornea - Right-No Hazy - Right. Sclera/Conjunctiva - Left-No scleral icterus, No Discharge - Left. Sclera/Conjunctiva - Right-No scleral icterus, No Discharge - Right. Pupil - Left-Direct reaction to light normal. Pupil - Right-Direct reaction to light normal.  ENMT Ears Pinna - Left - no drainage observed, no generalized tenderness observed. Pinna - Right - no drainage observed, no generalized tenderness observed. Nose and Sinuses External Inspection of the Nose - no destructive lesion observed. Inspection of the nares - Left - quiet respiration. Inspection of the nares - Right - quiet respiration. Mouth and Throat Lips - Upper Lip - no fissures observed, no pallor noted. Lower Lip - no fissures observed, no pallor noted. Nasopharynx - no discharge present. Oral Cavity/Oropharynx - Tongue - no dryness observed. Oral Mucosa - no cyanosis observed. Hypopharynx - no evidence of airway distress observed.  Chest and Lung Exam Inspection Movements - Normal and Symmetrical. Accessory muscles - No use of accessory muscles in breathing. Palpation Palpation of the chest reveals - Non-tender. Auscultation Breath sounds - Normal and Clear.  Cardiovascular Auscultation Rhythm - Regular. Murmurs & Other Heart Sounds - Auscultation of the heart reveals - No Murmurs and No Systolic  Clicks.  Abdomen Inspection Inspection of the abdomen reveals - No Visible peristalsis and No Abnormal pulsations. Umbilicus - No Bleeding, No Urine drainage. Palpation/Percussion Palpation and Percussion of the abdomen reveal - Soft, Non Tender, No Rebound tenderness, No Rigidity (guarding) and No Cutaneous hyperesthesia. Note: Abdomen obese but soft. Not severely distended. No diastasis recti. Good hygiene under panniculus. No rash. No umbilical or other anterior abdominal wall hernias  Female Genitourinary Sexual Maturity Tanner 5 - Adult hair pattern. Note: No vaginal bleeding nor discharge  Rectal Note: Some hypopigmentation and thinning. Handily. Most likely evidence of prior pruritus and prior surgery.  She does have some external tags including left posterior. Mild fullness in the soft tissue left posterior midline. However there is no active opening or sinus. I see no active abscess nor fistula.  Perianal skin clean with good hygiene. No active pruritis ani. No pilonidal disease. No fissure. No abscess/fistula. Anal sphincter somewhat stretched out but has good squeeze and otherwise no sphincter defects. No condyloma warts. I held off on internal exam given anoscopy and banding 2 weeks ago and no evidence of any major external issues.  Peripheral Vascular Upper Extremity Inspection - Left - No Cyanotic nailbeds - Left, Not Ischemic. Inspection - Right - No Cyanotic nailbeds - Right, Not Ischemic.  Neurologic Neurologic evaluation reveals -normal attention span and ability to concentrate, able to name objects and repeat phrases. Appropriate fund of knowledge , normal sensation and normal coordination. Mental Status Affect - not angry, not paranoid. Cranial Nerves-Normal Bilaterally. Gait-Normal.  Neuropsychiatric Mental status exam performed with findings of-able to articulate well with normal speech/language, rate, volume and coherence, thought  content normal with ability to perform basic computations and apply abstract reasoning and no evidence of hallucinations, delusions, obsessions or homicidal/suicidal ideation.  Musculoskeletal Global Assessment Spine, Ribs and Pelvis - no instability, subluxation or laxity. Right Upper Extremity - no instability, subluxation or laxity. Note: Moderate kyphosis/buffalo hump. Mild tenderness lumbar spine  Lymphatic Head & Neck General Head & Neck Lymphatics: Bilateral - Description - No Localized lymphadenopathy. Axillary General Axillary Region: Bilateral - Description - No Localized lymphadenopathy. Femoral & Inguinal Generalized Femoral & Inguinal Lymphatics: Left - Description - No Localized lymphadenopathy. Right - Description -  No Localized lymphadenopathy.    Assessment & Plan Jamie Sportsman MD; 11/19/2019 11:02 AM)  PROLAPSED INTERNAL HEMORRHOIDS, GRADE 2 (K64.1) Impression: Patient with persistent hemorrhoidal discomfort with burning and irritation. No durable improvement with hemorrhoidal banding 2. This point recommend examination under anesthesia with hemorrhoidal ligation/pexy him possible hemorrhoidectomies. Consider removing some of the external tags. Make sure there are no other issues going on. She is leaning towards that since banding did not help.  Current Plans You are being scheduled for surgery- Our schedulers will call you.  You should hear from our office's scheduling department within 5 working days about the location, date, and time of surgery. We try to make accommodations for patient's preferences in scheduling surgery, but sometimes the OR schedule or the surgeon's schedule prevents [place] from making those accommodations.  If you have not heard from our office [phone]) in 5 working days, call the office and ask for your surgeon's nurse.  If you have other questions about your diagnosis, plan, or surgery, call the office and ask for your surgeon's  nurse.  The anatomy & physiology of the anorectal region was discussed. The pathophysiology of hemorrhoids and differential diagnosis was discussed. Natural history risks without surgery was discussed. I stressed the importance of a bowel regimen to have daily soft bowel movements to minimize progression of disease. Interventions such as sclerotherapy & banding were discussed.  The patient's symptoms are not adequately controlled by medicines and other non-operative treatments. I feel the risks & problems of no surgery outweigh the operative risks; therefore, I recommended surgery to treat the hemorrhoids by ligation, pexy, and possible resection.  Risks such as bleeding, infection, urinary difficulties, need for further treatment, heart attack, death, and other risks were discussed. I noted a good likelihood this will help address the problem. Goals of post-operative recovery were discussed as well. Possibility that this will not correct all symptoms was explained. Post-operative pain, bleeding, constipation, and other problems after surgery were discussed. We will work to minimize complications. Educational handouts further explaining the pathology, treatment options, and bowel regimen were given as well. Questions were answered. The patient expresses understanding & wishes to proceed with surgery.   EXTERNAL HEMORRHOIDS WITH COMPLICATION (K64.4) Impression: She does have a few external hemorrhoids but they're not particularly inflammatory. Should banding at work and proceed to surgery, can try and excise at the time surgery. Trying hold off for now given her prior anorectal surgery  Current Plans Pt Education - [facility] Hemorrhoids (Boris Engelmann): discussed with patient and provided information.  HISTORY OF ANAL FISSURES (Z87.19) Impression: Some questionable history of anal fissures. I suspect she had an anal fistula that was repaired in 2008 and we are seeing is the scarring from that. I  detect no active opening or sinus. She gives me no symptoms of chronic drainage or pain. In reviewing the films I do see some scarring that correlates with the left posterior region but it is subtle at best. I suspect they're seeing evidence of an old fistula repair.  We will obtain operative records from 2008 to see what exactly was done.  If she truly has a fistula, she will get recurrent symptoms. Pleasant case, I can offer examination under anesthesia in the operating room with possible more aggressive evaluation. However feeling get a good exam today and is improved since her severe diarrhea is now under control.   CHRONIC DIARRHEA (K52.9) Impression: Chronic diarrhea of noninfectious or inflammatory etiology seems to be better control of bowel regimen  and Bentyl under the care of gastroenterology. I noted this is extremely important to avoid future diarrhea which will flare and hemorrhoids aren't risk recurrent infection/fistula   OBESITY, MORBID, BMI 40.0-49.9 (E66.01)   OSA ON CPAP (G47.33) Impression: Compliant on CPAP at night.  Jamie SportsmanSteven C. Chyan Carnero, MD, FACS, MASCRS Gastrointestinal and Minimally Invasive Surgery  [facility] [address] [place], [place] [id number] [phone] Main / Paging [phone] Fax

## 2019-11-22 ENCOUNTER — Telehealth: Payer: Self-pay | Admitting: Internal Medicine

## 2019-11-22 NOTE — Telephone Encounter (Signed)
Pt is scheduled EGD w/Propofol on 5/17 with [facility] and wants to reschedule. [phone]

## 2019-11-23 DIAGNOSIS — I10 Essential (primary) hypertension: Secondary | ICD-10-CM | POA: Diagnosis not present

## 2019-11-23 NOTE — Telephone Encounter (Signed)
Called patient and she wants to cancel egd for now. She is scheduled for hemorrhoidectomy and wants to wait until she recover. Called endo and made aware

## 2019-11-29 ENCOUNTER — Other Ambulatory Visit: Payer: Self-pay

## 2019-11-29 ENCOUNTER — Encounter (HOSPITAL_BASED_OUTPATIENT_CLINIC_OR_DEPARTMENT_OTHER): Payer: Self-pay | Admitting: Surgery

## 2019-11-29 NOTE — Progress Notes (Signed)
Spoke w/ via phone for pre-op interview--- PT Lab needs dos--  Istat, Urine preg., EKG            Lab results------ no COVID test ------ 12-01-2019 @ 1205 Arrive at ------- 0630 NPO after ------ MN Medications to take morning of surgery ----- Clonazepam, Wellbutrin, Topamax, Crestor, Abilify, Protonix w/ sips of water Diabetic medication ----- pt currently not taking metformin per her pcp due to chronic diarrhea Patient Special Instructions ----- n/a Pre-Op special Istructions ----- n/a Patient verbalized understanding of instructions that were given at this phone interview. Patient denies shortness of breath, chest pain, fever, cough a this phone interview.   PCP:  Dr Roxine Caddy Cardiologist :  no Chest x-ray : 01-22-2014 epic EKG : 05-21-2018 epic Echo : no Cardiac Cath :  no Sleep Study/ CPAP :  YES/ YES , followed by Dr Vickey Huger Fasting Blood Sugar :  96    / Checks Blood Sugar -- times a day:  Daily in AM Blood Thinner/ Instructions /Last Dose: NO ASA / Instructions/ Last Dose :  NO

## 2019-12-01 ENCOUNTER — Other Ambulatory Visit (HOSPITAL_COMMUNITY)
Admission: RE | Admit: 2019-12-01 | Discharge: 2019-12-01 | Disposition: A | Discharge status: Home / Self Care | Payer: Medicare Other | Source: Ambulatory Visit | Attending: Surgery | Admitting: Surgery

## 2019-12-01 DIAGNOSIS — Z01812 Encounter for preprocedural laboratory examination: Secondary | ICD-10-CM | POA: Diagnosis not present

## 2019-12-01 DIAGNOSIS — Z20822 Contact with and (suspected) exposure to covid-19: Secondary | ICD-10-CM | POA: Insufficient documentation

## 2019-12-01 LAB — SARS CORONAVIRUS 2 (TAT 6-24 HRS): SARS Coronavirus 2: NEGATIVE

## 2019-12-03 ENCOUNTER — Ambulatory Visit: Payer: Medicare Other | Admitting: Adult Health

## 2019-12-04 ENCOUNTER — Encounter (HOSPITAL_BASED_OUTPATIENT_CLINIC_OR_DEPARTMENT_OTHER): Payer: Self-pay | Admitting: Surgery

## 2019-12-04 NOTE — Anesthesia Preprocedure Evaluation (Addendum)
Anesthesia Evaluation  Patient identified by MRN, date of birth, ID band Patient awake    Reviewed: Allergy & Precautions, NPO status , Patient's Chart, lab work & pertinent test results  History of Anesthesia Complications (+) PONV and history of anesthetic complications  Airway Mallampati: IV  TM Distance: >3 FB Neck ROM: Full    Dental no notable dental hx. (+) Teeth Intact   Pulmonary sleep apnea and Continuous Positive Airway Pressure Ventilation ,    Pulmonary exam normal breath sounds clear to auscultation       Cardiovascular hypertension, Pt. on medications Normal cardiovascular exam Rhythm:Regular Rate:Normal     Neuro/Psych  Headaches, PSYCHIATRIC DISORDERS Anxiety Depression Bipolar Disorder    GI/Hepatic Neg liver ROS, hiatal hernia, GERD  Medicated and Controlled,Hemorrhoids Hx/o Anal fissure    Endo/Other  diabetes, Well Controlled, Type 2, Oral Hypoglycemic AgentsPCOS Hyperlipidemia  Renal/GU negative Renal ROS  negative genitourinary   Musculoskeletal  (+) Arthritis , Osteoarthritis,    Abdominal (+) + obese,   Peds  Hematology negative hematology ROS (+)   Anesthesia Other Findings   Reproductive/Obstetrics                           Anesthesia Physical Anesthesia Plan  ASA: III  Anesthesia Plan: General   Post-op Pain Management:    Induction: Intravenous  PONV Risk Score and Plan: 4 or greater and Scopolamine patch - Pre-op, Ondansetron, Treatment may vary due to age or medical condition and Midazolam  Airway Management Planned: Oral ETT and Video Laryngoscope Planned  Additional Equipment:   Intra-op Plan:   Post-operative Plan: Extubation in OR  Informed Consent: I have reviewed the patients History and Physical, chart, labs and discussed the procedure including the risks, benefits and alternatives for the proposed anesthesia with the patient or  authorized representative who has indicated his/her understanding and acceptance.     Dental advisory given  Plan Discussed with: CRNA and Surgeon  Anesthesia Plan Comments: (Possible difficult intubation)      Anesthesia Quick Evaluation

## 2019-12-05 ENCOUNTER — Encounter (HOSPITAL_BASED_OUTPATIENT_CLINIC_OR_DEPARTMENT_OTHER): Payer: Self-pay | Admitting: Surgery

## 2019-12-05 ENCOUNTER — Ambulatory Visit (HOSPITAL_BASED_OUTPATIENT_CLINIC_OR_DEPARTMENT_OTHER): Payer: Medicare Other | Admitting: Anesthesiology

## 2019-12-05 ENCOUNTER — Ambulatory Visit (HOSPITAL_BASED_OUTPATIENT_CLINIC_OR_DEPARTMENT_OTHER)
Admission: RE | Admit: 2019-12-05 | Discharge: 2019-12-05 | Disposition: A | Discharge status: Home / Self Care | Payer: Medicare Other | Attending: Surgery | Admitting: Surgery

## 2019-12-05 ENCOUNTER — Encounter (HOSPITAL_BASED_OUTPATIENT_CLINIC_OR_DEPARTMENT_OTHER)
Admission: RE | Disposition: A | Discharge status: Home / Self Care | Payer: Self-pay | Source: Home / Self Care | Attending: Surgery

## 2019-12-05 DIAGNOSIS — E785 Hyperlipidemia, unspecified: Secondary | ICD-10-CM | POA: Diagnosis not present

## 2019-12-05 DIAGNOSIS — K529 Noninfective gastroenteritis and colitis, unspecified: Secondary | ICD-10-CM | POA: Insufficient documentation

## 2019-12-05 DIAGNOSIS — G43909 Migraine, unspecified, not intractable, without status migrainosus: Secondary | ICD-10-CM | POA: Diagnosis not present

## 2019-12-05 DIAGNOSIS — E7800 Pure hypercholesterolemia, unspecified: Secondary | ICD-10-CM | POA: Diagnosis not present

## 2019-12-05 DIAGNOSIS — Z6841 Body Mass Index (BMI) 40.0 and over, adult: Secondary | ICD-10-CM | POA: Diagnosis not present

## 2019-12-05 DIAGNOSIS — F319 Bipolar disorder, unspecified: Secondary | ICD-10-CM | POA: Diagnosis not present

## 2019-12-05 DIAGNOSIS — M1990 Unspecified osteoarthritis, unspecified site: Secondary | ICD-10-CM | POA: Diagnosis not present

## 2019-12-05 DIAGNOSIS — K642 Third degree hemorrhoids: Secondary | ICD-10-CM | POA: Insufficient documentation

## 2019-12-05 DIAGNOSIS — K643 Fourth degree hemorrhoids: Secondary | ICD-10-CM

## 2019-12-05 DIAGNOSIS — K219 Gastro-esophageal reflux disease without esophagitis: Secondary | ICD-10-CM | POA: Diagnosis not present

## 2019-12-05 DIAGNOSIS — L290 Pruritus ani: Secondary | ICD-10-CM | POA: Diagnosis not present

## 2019-12-05 DIAGNOSIS — I10 Essential (primary) hypertension: Secondary | ICD-10-CM | POA: Diagnosis not present

## 2019-12-05 DIAGNOSIS — G4733 Obstructive sleep apnea (adult) (pediatric): Secondary | ICD-10-CM | POA: Insufficient documentation

## 2019-12-05 DIAGNOSIS — K644 Residual hemorrhoidal skin tags: Secondary | ICD-10-CM | POA: Diagnosis not present

## 2019-12-05 DIAGNOSIS — E119 Type 2 diabetes mellitus without complications: Secondary | ICD-10-CM | POA: Diagnosis not present

## 2019-12-05 DIAGNOSIS — G8929 Other chronic pain: Secondary | ICD-10-CM | POA: Insufficient documentation

## 2019-12-05 HISTORY — DX: Diaphragmatic hernia without obstruction or gangrene: K44.9

## 2019-12-05 HISTORY — DX: Migraine, unspecified, not intractable, without status migrainosus: G43.909

## 2019-12-05 HISTORY — DX: Diverticulosis of large intestine without perforation or abscess without bleeding: K57.30

## 2019-12-05 HISTORY — DX: Obstructive sleep apnea (adult) (pediatric): G47.33

## 2019-12-05 HISTORY — PX: EVALUATION UNDER ANESTHESIA WITH HEMORRHOIDECTOMY: SHX5624

## 2019-12-05 HISTORY — DX: Hyperlipidemia, unspecified: E78.5

## 2019-12-05 HISTORY — DX: Type 2 diabetes mellitus without complications: E11.9

## 2019-12-05 HISTORY — DX: Personal history of traumatic brain injury: Z87.820

## 2019-12-05 HISTORY — DX: Noninfective gastroenteritis and colitis, unspecified: K52.9

## 2019-12-05 HISTORY — DX: Obstructive sleep apnea (adult) (pediatric): Z99.89

## 2019-12-05 HISTORY — DX: Major depressive disorder, single episode, unspecified: F32.9

## 2019-12-05 LAB — POCT I-STAT, CHEM 8
BUN: 18 mg/dL (ref 6–20)
Calcium, Ion: 1.24 mmol/L (ref 1.15–1.40)
Chloride: 97 mmol/L — ABNORMAL LOW (ref 98–111)
Creatinine, Ser: 1.2 mg/dL — ABNORMAL HIGH (ref 0.44–1.00)
Glucose, Bld: 223 mg/dL — ABNORMAL HIGH (ref 70–99)
HCT: 43 % (ref 36.0–46.0)
Hemoglobin: 14.6 g/dL (ref 12.0–15.0)
Potassium: 4.2 mmol/L (ref 3.5–5.1)
Sodium: 138 mmol/L (ref 135–145)
TCO2: 31 mmol/L (ref 22–32)

## 2019-12-05 LAB — GLUCOSE, CAPILLARY
Glucose-Capillary: 231 mg/dL — ABNORMAL HIGH (ref 70–99)
Glucose-Capillary: 236 mg/dL — ABNORMAL HIGH (ref 70–99)

## 2019-12-05 LAB — POCT PREGNANCY, URINE: Preg Test, Ur: NEGATIVE

## 2019-12-05 SURGERY — EXAM UNDER ANESTHESIA WITH HEMORRHOIDECTOMY
Anesthesia: General | Site: Rectum

## 2019-12-05 MED ORDER — BUPIVACAINE-EPINEPHRINE 0.25% -1:200000 IJ SOLN
INTRAMUSCULAR | Status: DC | PRN
  Administered 2019-12-05: 20 mL

## 2019-12-05 MED ORDER — SCOPOLAMINE 1 MG/3DAYS TD PT72
MEDICATED_PATCH | TRANSDERMAL | Status: DC | PRN
  Administered 2019-12-05: 1.5 mg via TRANSDERMAL

## 2019-12-05 MED ORDER — LIDOCAINE 2% (20 MG/ML) 5 ML SYRINGE
INTRAMUSCULAR | Status: AC
  Filled 2019-12-05: qty 5

## 2019-12-05 MED ORDER — DIBUCAINE (PERIANAL) 1 % EX OINT
TOPICAL_OINTMENT | CUTANEOUS | Status: DC | PRN
  Administered 2019-12-05: 1 via RECTAL

## 2019-12-05 MED ORDER — SUCCINYLCHOLINE CHLORIDE 200 MG/10ML IV SOSY
PREFILLED_SYRINGE | INTRAVENOUS | Status: AC
  Filled 2019-12-05: qty 10

## 2019-12-05 MED ORDER — BUPIVACAINE LIPOSOME 1.3 % IJ SUSP: 20.0000 mL | Freq: Once | INTRAMUSCULAR | Status: DC

## 2019-12-05 MED ORDER — FENTANYL CITRATE (PF) 100 MCG/2ML IJ SOLN
25.0000 ug | INTRAMUSCULAR | Status: DC | PRN
  Administered 2019-12-05: 25 ug via INTRAVENOUS

## 2019-12-05 MED ORDER — ACETAMINOPHEN 500 MG PO TABS
1000.0000 mg | ORAL_TABLET | ORAL | Status: AC
  Administered 2019-12-05: 1000 mg via ORAL

## 2019-12-05 MED ORDER — CHLORHEXIDINE GLUCONATE CLOTH 2 % EX PADS: 6.0000 | MEDICATED_PAD | Freq: Once | CUTANEOUS | Status: DC

## 2019-12-05 MED ORDER — MIDAZOLAM HCL 2 MG/2ML IJ SOLN
INTRAMUSCULAR | Status: AC
  Filled 2019-12-05: qty 2

## 2019-12-05 MED ORDER — MIDAZOLAM HCL 5 MG/5ML IJ SOLN
INTRAMUSCULAR | Status: DC | PRN
  Administered 2019-12-05: 2 mg via INTRAVENOUS

## 2019-12-05 MED ORDER — SODIUM CHLORIDE 0.9 % IV SOLN
INTRAVENOUS | Status: AC
  Filled 2019-12-05: qty 100

## 2019-12-05 MED ORDER — METOCLOPRAMIDE HCL 5 MG/ML IJ SOLN: 10.0000 mg | Freq: Once | INTRAMUSCULAR | Status: DC | PRN

## 2019-12-05 MED ORDER — ONDANSETRON HCL 4 MG/2ML IJ SOLN
INTRAMUSCULAR | Status: AC
  Filled 2019-12-05: qty 2

## 2019-12-05 MED ORDER — DEXAMETHASONE SODIUM PHOSPHATE 10 MG/ML IJ SOLN: INTRAMUSCULAR | Status: DC | PRN

## 2019-12-05 MED ORDER — ONDANSETRON HCL 4 MG/2ML IJ SOLN
INTRAMUSCULAR | Status: DC | PRN
  Administered 2019-12-05: 4 mg via INTRAVENOUS

## 2019-12-05 MED ORDER — ROCURONIUM BROMIDE 10 MG/ML (PF) SYRINGE
PREFILLED_SYRINGE | INTRAVENOUS | Status: AC
  Filled 2019-12-05: qty 10

## 2019-12-05 MED ORDER — OXYCODONE HCL 5 MG PO TABS
5.0000 mg | ORAL_TABLET | Freq: Once | ORAL | Status: AC | PRN
  Administered 2019-12-05: 5 mg via ORAL

## 2019-12-05 MED ORDER — GABAPENTIN 300 MG PO CAPS
ORAL_CAPSULE | ORAL | Status: AC
  Filled 2019-12-05: qty 1

## 2019-12-05 MED ORDER — SCOPOLAMINE 1 MG/3DAYS TD PT72
MEDICATED_PATCH | TRANSDERMAL | Status: AC
  Filled 2019-12-05: qty 1

## 2019-12-05 MED ORDER — SUCCINYLCHOLINE CHLORIDE 200 MG/10ML IV SOSY
PREFILLED_SYRINGE | INTRAVENOUS | Status: DC | PRN
  Administered 2019-12-05: 140 mg via INTRAVENOUS

## 2019-12-05 MED ORDER — BUPIVACAINE LIPOSOME 1.3 % IJ SUSP
INTRAMUSCULAR | Status: DC | PRN
  Administered 2019-12-05: 20 mL

## 2019-12-05 MED ORDER — ACETAMINOPHEN 500 MG PO TABS
ORAL_TABLET | ORAL | Status: AC
  Filled 2019-12-05: qty 2

## 2019-12-05 MED ORDER — CHLORHEXIDINE GLUCONATE 0.12 % MT SOLN: 15.0000 mL | Freq: Once | OROMUCOSAL | Status: DC

## 2019-12-05 MED ORDER — CELECOXIB 200 MG PO CAPS
ORAL_CAPSULE | ORAL | Status: AC
  Filled 2019-12-05: qty 1

## 2019-12-05 MED ORDER — LIDOCAINE 2% (20 MG/ML) 5 ML SYRINGE
INTRAMUSCULAR | Status: DC | PRN
  Administered 2019-12-05: 100 mg via INTRAVENOUS

## 2019-12-05 MED ORDER — CEFTRIAXONE SODIUM 2 G IJ SOLR
INTRAMUSCULAR | Status: AC
  Filled 2019-12-05: qty 20

## 2019-12-05 MED ORDER — FENTANYL CITRATE (PF) 100 MCG/2ML IJ SOLN
INTRAMUSCULAR | Status: AC
  Filled 2019-12-05: qty 2

## 2019-12-05 MED ORDER — OXYCODONE HCL 5 MG PO TABS: 5.0000 mg | ORAL_TABLET | Freq: Four times a day (QID) | ORAL | 0 refills | Status: DC | PRN

## 2019-12-05 MED ORDER — PROPOFOL 10 MG/ML IV BOLUS
INTRAVENOUS | Status: DC | PRN
  Administered 2019-12-05: 150 mg via INTRAVENOUS

## 2019-12-05 MED ORDER — CELECOXIB 200 MG PO CAPS
200.0000 mg | ORAL_CAPSULE | ORAL | Status: AC
  Administered 2019-12-05: 200 mg via ORAL

## 2019-12-05 MED ORDER — DEXAMETHASONE SODIUM PHOSPHATE 10 MG/ML IJ SOLN
INTRAMUSCULAR | Status: AC
  Filled 2019-12-05: qty 1

## 2019-12-05 MED ORDER — ORAL CARE MOUTH RINSE: 15.0000 mL | Freq: Once | OROMUCOSAL | Status: DC

## 2019-12-05 MED ORDER — METRONIDAZOLE IN NACL 5-0.79 MG/ML-% IV SOLN
500.0000 mg | INTRAVENOUS | Status: AC
  Administered 2019-12-05: 500 mg via INTRAVENOUS

## 2019-12-05 MED ORDER — INSULIN ASPART 100 UNIT/ML ~~LOC~~ SOLN
5.0000 [IU] | Freq: Once | SUBCUTANEOUS | Status: AC
  Administered 2019-12-05: 5 [IU] via SUBCUTANEOUS

## 2019-12-05 MED ORDER — FENTANYL CITRATE (PF) 250 MCG/5ML IJ SOLN
INTRAMUSCULAR | Status: AC
  Filled 2019-12-05: qty 5

## 2019-12-05 MED ORDER — SODIUM CHLORIDE 0.9 % IV SOLN
2.0000 g | INTRAVENOUS | Status: AC
  Administered 2019-12-05: 2 g via INTRAVENOUS

## 2019-12-05 MED ORDER — GABAPENTIN 300 MG PO CAPS
300.0000 mg | ORAL_CAPSULE | ORAL | Status: AC
  Administered 2019-12-05: 300 mg via ORAL

## 2019-12-05 MED ORDER — METRONIDAZOLE IN NACL 5-0.79 MG/ML-% IV SOLN
INTRAVENOUS | Status: AC
  Filled 2019-12-05: qty 100

## 2019-12-05 MED ORDER — FENTANYL CITRATE (PF) 250 MCG/5ML IJ SOLN
INTRAMUSCULAR | Status: DC | PRN
  Administered 2019-12-05: 50 ug via INTRAVENOUS
  Administered 2019-12-05: 100 ug via INTRAVENOUS

## 2019-12-05 MED ORDER — INSULIN ASPART 100 UNIT/ML ~~LOC~~ SOLN
SUBCUTANEOUS | Status: AC
  Filled 2019-12-05: qty 1

## 2019-12-05 MED ORDER — PROPOFOL 10 MG/ML IV BOLUS
INTRAVENOUS | Status: AC
  Filled 2019-12-05: qty 40

## 2019-12-05 MED ORDER — OXYCODONE HCL 5 MG PO TABS
ORAL_TABLET | ORAL | Status: AC
  Filled 2019-12-05: qty 1

## 2019-12-05 MED ORDER — SODIUM CHLORIDE 0.9 % IV SOLN: INTRAVENOUS | Status: DC

## 2019-12-05 MED ORDER — OXYCODONE HCL 5 MG/5ML PO SOLN: 5.0000 mg | Freq: Once | ORAL | Status: AC | PRN

## 2019-12-05 SURGICAL SUPPLY — 55 items
BENZOIN TINCTURE PRP APPL 2/3 (GAUZE/BANDAGES/DRESSINGS) ×3 IMPLANT
BLADE HEX COATED 2.75 (ELECTRODE) ×3 IMPLANT
BLADE SURG 10 STRL SS (BLADE) IMPLANT
BLADE SURG 15 STRL LF DISP TIS (BLADE) ×1 IMPLANT
BLADE SURG 15 STRL SS (BLADE) ×3
BRIEF STRETCH FOR OB PAD LRG (UNDERPADS AND DIAPERS) ×3 IMPLANT
CANISTER SUCT 1200ML W/VALVE (MISCELLANEOUS) ×3 IMPLANT
COVER BACK TABLE 60X90IN (DRAPES) ×3 IMPLANT
COVER MAYO STAND STRL (DRAPES) ×3 IMPLANT
COVER WAND RF STERILE (DRAPES) ×3 IMPLANT
DECANTER SPIKE VIAL GLASS SM (MISCELLANEOUS) ×3 IMPLANT
DRAPE HYSTEROSCOPY (DRAPE) IMPLANT
DRAPE LAPAROTOMY 100X72 PEDS (DRAPES) ×3 IMPLANT
DRAPE SHEET LG 3/4 BI-LAMINATE (DRAPES) IMPLANT
DRSG PAD ABDOMINAL 8X10 ST (GAUZE/BANDAGES/DRESSINGS) ×3 IMPLANT
ELECT NEEDLE TIP 2.8 STRL (NEEDLE) IMPLANT
ELECT REM PT RETURN 9FT ADLT (ELECTROSURGICAL) ×3
ELECTRODE REM PT RTRN 9FT ADLT (ELECTROSURGICAL) ×1 IMPLANT
FILTER STRAW (MISCELLANEOUS) ×3 IMPLANT
GAUZE SPONGE 4X4 12PLY STRL LF (GAUZE/BANDAGES/DRESSINGS) ×3 IMPLANT
GLOVE ECLIPSE 8.0 STRL XLNG CF (GLOVE) ×3 IMPLANT
GLOVE INDICATOR 8.0 STRL GRN (GLOVE) ×3 IMPLANT
GOWN STRL REUS W/TWL XL LVL3 (GOWN DISPOSABLE) ×3 IMPLANT
IV CATH PLACEMENT 20 GA (IV SOLUTION) ×3 IMPLANT
KIT SIGMOIDOSCOPE (SET/KITS/TRAYS/PACK) IMPLANT
KIT TURNOVER CYSTO (KITS) ×3 IMPLANT
LEGGING LITHOTOMY PAIR STRL (DRAPES) IMPLANT
NEEDLE HYPO 22GX1.5 SAFETY (NEEDLE) ×3 IMPLANT
NS IRRIG 500ML POUR BTL (IV SOLUTION) ×3 IMPLANT
PAD PREP 24X48 CUFFED NSTRL (MISCELLANEOUS) IMPLANT
PENCIL BUTTON HOLSTER BLD 10FT (ELECTRODE) ×3 IMPLANT
SCRUB TECHNI CARE 4 OZ NO DYE (MISCELLANEOUS) ×3 IMPLANT
SET BASIN DAY SURGERY F.S. (CUSTOM PROCEDURE TRAY) ×3 IMPLANT
SHEARS HARMONIC 9CM CVD (BLADE) IMPLANT
SURGILUBE 2OZ TUBE FLIPTOP (MISCELLANEOUS) ×3 IMPLANT
SUT CHROMIC 2 0 CT 1 (SUTURE) ×6 IMPLANT
SUT CHROMIC 2 0 SH (SUTURE) IMPLANT
SUT CHROMIC 3 0 SH 27 (SUTURE) IMPLANT
SUT VIC AB 2-0 SH 27 (SUTURE)
SUT VIC AB 2-0 SH 27XBRD (SUTURE) IMPLANT
SUT VIC AB 2-0 UR6 27 (SUTURE) ×18 IMPLANT
SUT VICRYL 0 UR6 27IN ABS (SUTURE) IMPLANT
SUT VICRYL AB 2 0 TIE (SUTURE) IMPLANT
SUT VICRYL AB 2 0 TIES (SUTURE)
SYR 20ML LL LF (SYRINGE) ×3 IMPLANT
SYR 27GX1/2 1ML LL SAFETY (SYRINGE) ×3 IMPLANT
SYR BULB IRRIG 60ML STRL (SYRINGE) ×3 IMPLANT
SYR CONTROL 10ML LL (SYRINGE) IMPLANT
TAPE CLOTH 3X10 TAN LF (GAUZE/BANDAGES/DRESSINGS) ×3 IMPLANT
TOWEL OR 17X26 10 PK STRL BLUE (TOWEL DISPOSABLE) ×3 IMPLANT
TRAY DSU PREP LF (CUSTOM PROCEDURE TRAY) ×3 IMPLANT
TUBE CONNECTING 12'X1/4 (SUCTIONS) ×1
TUBE CONNECTING 12X1/4 (SUCTIONS) ×2 IMPLANT
UNDERPAD 30X30 (UNDERPADS AND DIAPERS) ×3 IMPLANT
YANKAUER SUCT BULB TIP NO VENT (SUCTIONS) ×3 IMPLANT

## 2019-12-05 NOTE — Interval H&P Note (Signed)
History and Physical Interval Note:  12/05/2019 7:36 AM  Jamie [name]  has presented today for surgery, with the diagnosis of hemorrhoids prolapsing grade 3 with bleeding with pain.  The various methods of treatment have been discussed with the patient and family. After consideration of risks, benefits and other options for treatment, the patient has consented to  Procedure(s) with comments: ANORECTAL EXAM UNDER ANESTHESIA WITH HEMORRHOIDECTOMY, HEMORRHOIDAL LIGATION/PEXY ([name]) - GENERAL AND LOCAL as a surgical intervention.  The patient's history has been reviewed, patient examined, no change in status, stable for surgery.  I have reviewed the patient's chart and labs.  Questions were answered to the patient's satisfaction.    I have re-reviewed the the patient's records, history, medications, and allergies.  I have re-examined the patient.  I again discussed intraoperative plans and goals of post-operative recovery.  The patient agrees to proceed.  Jamie [name]  February 09, 1971 [id number]  Patient Care Team: Joeseph Amor as PCP - General (General Practice) [name], MD as Consulting Physician (Gastroenterology)  Patient Active Problem List   Diagnosis Date Noted   Abdominal pain, chronic, epigastric 09/18/2019   Chronic diarrhea 06/12/2019   Common migraine 11/03/2016   OSA on CPAP 11/03/2016   Lumbar herniated disc 01/22/2014   Headache 12/06/2012   Bipolar I disorder, severe, current or most recent episode depressed, with psychotic features (HCC) 11/15/2012   Severe major depression with psychotic features, mood-congruent (HCC) 05/03/2012   Generalized anxiety disorder 06/09/2011    Past Medical History:  Diagnosis Date   Anxiety    Arthritis    knees   Bipolar 1 disorder (HCC)    Chronic diarrhea    Diverticulosis of colon    GERD (gastroesophageal reflux disease)    Hiatal hernia    History of concussion    per pt 11/ 2020 w/ no residual   Hyperlipidemia     Hypertension    followed by pcp   (11-29-2019  per pt stated never had stress test)   MDD (major depressive disorder)    Migraine    neurologist--- dr Terrace Arabia   OSA on CPAP    followed by dr dohmeier   PCOS (polycystic ovarian syndrome)    PONV (postoperative nausea and vomiting)    Type 2 diabetes mellitus (HCC)    followed by pcp   (11-29-2019  pt stated checks blood sugar daily in am,  fasting sugar--- 96    Past Surgical History:  Procedure Laterality Date   ANAL FISSURE REPAIR  x3  last one  2007  approx.   BIOPSY  09/06/2019   Procedure: BIOPSY;  Surgeon: [name], MD;  Location: [facility];  Service: Endoscopy;;   COLONOSCOPY WITH PROPOFOL [name] 09/06/2019   pancolonic diverticulosis. Segmental biopsy completed with benign biopsies.    EVALUATION UNDER ANESTHESIA WITH TEAR DUCT PROBING  infant   KNEE ARTHROSCOPY Right 1994   LAPAROSCOPIC CHOLECYSTECTOMY  08-06-2002  @[facility]    LUMBAR LAMINECTOMY/DECOMPRESSION MICRODISCECTOMY Right 01/22/2014   Procedure: LUMBAR FOUR TO FIVE LUMBAR LAMINECTOMY/DECOMPRESSION MICRODISCECTOMY 1 LEVEL;  Surgeon: 01/24/2014, MD;  Location: [facility];  Service: Neurosurgery;  Laterality: Right;  Right L45 diskectomy    Social History   Socioeconomic History   Marital status: Married    Spouse name: Not on file   Number of children: 0   Years of education: college   Highest education level: Not on file  Occupational History   Occupation: disabled  Tobacco  Use   Smoking status: Never Smoker   Smokeless tobacco: Never Used  Substance and Sexual Activity   Alcohol use: No   Drug use: Never   Sexual activity: Yes    Partners: Male    Birth control/protection: None  Other Topics Concern   Not on file  Social History Narrative   Patient lives at home alone    Patient is right handed   Patient drinks soda's daily   Social Determinants of Health   Financial Resource Strain:    Difficulty of Paying Living Expenses:   Food  Insecurity:    Worried About Programme researcher, broadcasting/film/video in the Last Year:    Barista in the Last Year:   Transportation Needs:    Freight forwarder (Medical):    Lack of Transportation (Non-Medical):   Physical Activity:    Days of Exercise per Week:    Minutes of Exercise per Session:   Stress:    Feeling of Stress :   Social Connections:    Frequency of Communication with Friends and Family:    Frequency of Social Gatherings with Friends and Family:    Attends Religious Services:    Active Member of Clubs or Organizations:    Attends Engineer, structural:    Marital Status:   Intimate Partner Violence:    Fear of Current or Ex-Partner:    Emotionally Abused:    Physically Abused:    Sexually Abused:     Family History  Problem Relation Age of Onset   Anxiety disorder Maternal Grandmother    Depression Maternal Grandmother    Colon cancer Neg Hx    Colon polyps Neg Hx     Medications Prior to Admission  Medication Sig Dispense Refill Last Dose   ARIPiprazole (ABILIFY) 10 MG tablet Take 1 tablet (10 mg total) by mouth daily. 30 tablet 5 12/04/2019 at Unknown time   benzonatate (TESSALON) 200 MG capsule Take 1 capsule (200 mg total) by mouth 3 (three) times daily as needed for cough. 60 capsule 1 Past Month at Unknown time   buPROPion (WELLBUTRIN XL) 150 MG 24 hr tablet Take 3 tablets (450 mg total) by mouth daily. 90 tablet 5 12/04/2019 at Unknown time   clonazePAM (KLONOPIN) 1 MG tablet Take 1-2 tablets (1-2 mg total) by mouth See admin instructions. Take 1 mg in the morning and 2 mg at night 90 tablet 4 12/04/2019 at Unknown time   dicyclomine (BENTYL) 10 MG capsule Take 1 capsule (10 mg total) by mouth 4 (four) times daily -  before meals and at bedtime. 120 capsule 3 12/04/2019 at Unknown time   DULoxetine (CYMBALTA) 60 MG capsule Take 2 capsules (120 mg total) by mouth at bedtime. 60 capsule 4 12/04/2019 at Unknown time   FIBER PO Take by mouth daily.    12/04/2019 at Unknown time   fluticasone (FLONASE) 50 MCG/ACT nasal spray Place 1 spray into both nostrils daily as needed for allergies or rhinitis.    Past Month at Unknown time   lisinopril (PRINIVIL,ZESTRIL) 5 MG tablet Take 5 mg by mouth daily.   12/04/2019 at Unknown time   Menthol, Topical Analgesic, (BIOFREEZE EX) Apply 1 application topically daily as needed (back pain).   12/04/2019 at Unknown time   metFORMIN (GLUCOPHAGE-XR) 500 MG 24 hr tablet Take 2,000 mg by mouth daily.    Past Month at Unknown time   naproxen sodium (ALEVE) 220 MG tablet Take 220 mg by  mouth 2 (two) times daily.   12/04/2019 at Unknown time   ondansetron (ZOFRAN-ODT) 4 MG disintegrating tablet TAKE 1 TABLET BY MOUTH EVERY 8 HOURS AS NEEDED FOR NAUSEA AND VOMITING (Patient taking differently: Take 4 mg by mouth every 8 (eight) hours as needed. ) 40 tablet 3 12/04/2019 at Unknown time   pantoprazole (PROTONIX) 40 MG tablet Take 1 tablet (40 mg total) by mouth daily. Take 30 minutes before breakfast (Patient taking differently: Take 40 mg by mouth daily. Take 30 minutes before breakfast) 90 tablet 3 12/04/2019 at Unknown time   rosuvastatin (CRESTOR) 20 MG tablet Take 10 mg by mouth daily.    12/04/2019 at Unknown time   topiramate (TOPAMAX) 50 MG tablet Take 1 tablet (50 mg total) by mouth 2 (two) times daily. 180 tablet 3 12/04/2019 at Unknown time   traZODone (DESYREL) 100 MG tablet Take 4 tablets (400 mg total) by mouth at bedtime. 120 tablet 4 12/04/2019 at Unknown time    Current Facility-Administered Medications  Medication Dose Route Frequency Provider Last Rate Last Admin   0.9 %  sodium chloride infusion   Intravenous Continuous Eilene Ghazi, MD 50 mL/hr at 12/05/19 0725 New Bag at 12/05/19 0725   bupivacaine liposome (EXPAREL) 1.3 % injection 266 mg  20 mL Infiltration Once [name], MD       cefTRIAXone (ROCEPHIN) 2 g in sodium chloride 0.9 % 100 mL IVPB  2 g Intravenous On Call to OR [name], MD        And   metroNIDAZOLE (FLAGYL) IVPB 500 mg  500 mg Intravenous On Call to OR [name], MD       Chlorhexidine Gluconate Cloth 2 % PADS 6 each  6 each Topical Once [name], MD       And   Chlorhexidine Gluconate Cloth 2 % PADS 6 each  6 each Topical Once [name], MD         Allergies  Allergen Reactions   Codeine Hives and Swelling   Morphine And Related Swelling and Rash    BP (!) 138/91   Pulse (!) 109   Temp 98.1 F (36.7 C) (Oral)   Resp 18   Ht 5\' 3"  (1.6 m)   Wt 114.5 kg   SpO2 98%   BMI 44.73 kg/m   Labs: Results for orders placed or performed during the hospital encounter of 12/05/19 (from the past 48 hour(s))  Pregnancy, urine POC     Status: None   Collection Time: 12/05/19  6:50 AM  Result Value Ref Range   Preg Test, Ur NEGATIVE NEGATIVE    Comment:        THE SENSITIVITY OF THIS METHODOLOGY IS >24 mIU/mL   I-STAT, chem 8     Status: Abnormal   Collection Time: 12/05/19  7:23 AM  Result Value Ref Range   Sodium 138 135 - 145 mmol/L   Potassium 4.2 3.5 - 5.1 mmol/L   Chloride 97 (L) 98 - 111 mmol/L   BUN 18 6 - 20 mg/dL   Creatinine, Ser 02/04/20 (H) 0.44 - 1.00 mg/dL   Glucose, Bld 1.61 (H) 70 - 99 mg/dL    Comment: Glucose reference range applies only to samples taken after fasting for at least 8 hours.   Calcium, Ion 1.24 1.15 - 1.40 mmol/L   TCO2 31 22 - 32 mmol/L   Hemoglobin 14.6 12.0 - 15.0 g/dL   HCT 096 04.5 - 40.9 %    Imaging /  Studies: No results found.   Adin Hector, M.D., F.A.C.S. Gastrointestinal and Minimally Invasive Surgery [facility] [address], [place], [place] [address] [phone] Main / Paging  12/05/2019 7:41 AM    Adin Hector

## 2019-12-05 NOTE — Transfer of Care (Signed)
Immediate Anesthesia Transfer of Care Note  Patient: Jamie [name]  Procedure(s) Performed: ANORECTAL EXAM UNDER ANESTHESIA WITH HEMORRHOIDECTOMY, HEMORRHOIDAL LIGATION/PEXY ([name] Rectum)  Patient Location: PACU  Anesthesia Type:General  Level of Consciousness: awake, alert  and oriented  Airway & Oxygen Therapy: Patient Spontanous Breathing and Patient connected to face mask oxygen  Post-op Assessment: Report given to RN and Post -op Vital signs reviewed and stable  Post vital signs: Reviewed and stable  Last Vitals:  Vitals Value Taken Time  BP    Temp    Pulse    Resp    SpO2      Last Pain:  Vitals:   12/05/19 0705  TempSrc: Oral  PainSc: 5       Patients Stated Pain Goal: 7 (12/05/19 0705)  Complications: No apparent anesthesia complications

## 2019-12-05 NOTE — Anesthesia Postprocedure Evaluation (Signed)
Anesthesia Post Note  Patient: Jamie [name]  Procedure(s) Performed: ANORECTAL EXAM UNDER ANESTHESIA WITH HEMORRHOIDECTOMY, HEMORRHOIDAL LIGATION/PEXY ([name] Rectum)     Patient location during evaluation: PACU Anesthesia Type: General Level of consciousness: awake and alert and oriented Pain management: pain level controlled Vital Signs Assessment: post-procedure vital signs reviewed and stable Respiratory status: spontaneous breathing, nonlabored ventilation and respiratory function stable Cardiovascular status: blood pressure returned to baseline and stable Postop Assessment: no apparent nausea or vomiting Anesthetic complications: no    Last Vitals:  Vitals:   12/05/19 1100 12/05/19 1113  BP: 126/74   Pulse: (!) 105 (!) 102  Resp: 18 20  Temp:    SpO2: 95% 95%    Last Pain:  Vitals:   12/05/19 1145  TempSrc:   PainSc: 3                  Jamie Kercheval A.

## 2019-12-05 NOTE — Discharge Instructions (Signed)
ANORECTAL SURGERY:  POST OPERATIVE INSTRUCTIONS  ######################################################################  EAT Start with a pureed / full liquid diet After 24 hours, gradually transition to a high fiber diet.    CONTROL PAIN Control pain so you can tolerate bowel movements,  walk, sleep, tolerate sneezing/coughing, and go up/down stairs.   HAVE A BOWEL MOVEMENT DAILY Keep your bowels regular to avoid problems.   Taking a fiber supplement every day to keep bowels soft.   Try a laxative to override constipation. Use an antidairrheal to slow down diarrhea.   Call if not better after 2 tries  WALK Walk an hour a day.  Control your pain to do that.   CALL IF YOU HAVE PROBLEMS/CONCERNS Call if you are still struggling despite following these instructions. Call if you have concerns not answered by these instructions  ######################################################################    1. Take your usually prescribed home medications unless otherwise directed.  2. DIET: Follow a light bland diet & liquids the first 24 hours after arrival home, such as soup, liquids, starches, etc.  Be sure to drink plenty of fluids.  Quickly advance to a usual solid diet within a few days.  Avoid fast food or heavy meals as your are more likely to get nauseated or have irregular bowels.  A low-fat, high-fiber diet for the rest of your life is ideal.  3. PAIN CONTROL: a. Pain is best controlled by a usual combination of three different methods TOGETHER: i. Ice/Heat ii. Over the counter pain medication iii. Prescription pain medication b. Expect swelling and discomfort in the anus/rectal area.  Warm water baths (30-60 minutes up to 6 times a day, especially after bowel meovements) will help. Use ice for the first few days to help decrease swelling and bruising, then switch to heat such as warm towels, sitz baths, warm baths, etc to help relax tight/sore spots and speed recovery.   Some people prefer to use ice alone, heat alone, alternating between ice & heat.  Experiment to what works for you.   c. It is helpful to take an over-the-counter pain medication continuously for the first few weeks.  Choose one of the following that works best for you: i. Naproxen (Aleve, etc)  Two 250m tabs twice a day ii. Ibuprofen (Advil, etc) Three 2072mtabs four times a day (every meal & bedtime) iii. Acetaminophen (Tylenol, etc) 500-65044mour times a day (every meal & bedtime) d. A  prescription for pain medication (such as oxycodone, hydrocodone, etc) should be given to you upon discharge.  Take your pain medication as prescribed.  i. If you are having problems/concerns with the prescription medicine (does not control pain, nausea, vomiting, rash, itching, etc), please call us [phone] see if we need to switch you to a different pain medicine that will work better for you and/or control your side effect better. ii. If you need a refill on your pain medication, please contact your pharmacy.  They will contact our office to request authorization. Prescriptions will not be filled after 5 pm or on week-ends.  If can take up to 48 hours for it to be filled & ready so avoid waiting until you are down to thel ast pill. e. A topical cream (Dibucaine) or a prescription for a cream (such as diltiazem 2% gel) may be given to you.  Many people find relief with topical creams.  Some people find it burns too much.  Experiment.  If it helps, use it.  If it burns, don't using  it.  Use a Sitz Bath 4-8 times a day for relief   [employer] A sitz bath is a warm water bath taken in the sitting position that covers only the hips and buttocks. It may be used for either healing or hygiene purposes. Sitz baths are also used to relieve pain, itching, or muscle spasms. The water may contain medicine. Moist heat will help you heal and relax.  HOME CARE INSTRUCTIONS  Take 3 to 4 sitz baths a day. 1. Fill the  bathtub half full with warm water. 2. Sit in the water and open the drain a little. 3. Turn on the warm water to keep the tub half full. Keep the water running constantly. 4. Soak in the water for 15 to 20 minutes. 5. After the sitz bath, pat the affected area dry first.   4. KEEP YOUR BOWELS REGULAR a. The goal is one soft bowel movement a day b. Avoid getting constipated.  Between the surgery and the pain medications, it is common to experience some constipation.  Increasing fluid intake and taking a fiber supplement (such as Metamucil, Citrucel, FiberCon, MiraLax, etc) 2-3 times a day regularly will usually help prevent this problem from occurring.  A mild laxative (prune juice, Milk of Magnesia, MiraLax, etc) should be taken according to package directions if there are no bowel movements after 48 hours. c. Watch out for diarrhea.  If you have many loose bowel movements, simplify your diet to bland foods & liquids for a few days.  Stop any stool softeners and decrease your fiber supplement.  Switching to mild anti-diarrheal medications (Kayopectate, Pepto Bismol) can help.  Can try an imodium/loperamide dose.  If this worsens or does not improve, please call us.  5. Wound Care  a. Remove your bandages with your first bowel movement, usually the day after surgery.  You may have packing if you had an abscess.  Let any packing or gauze fall come out.   b. Wear an absorbent pad or soft cotton balls in your underwear as needed to catch any drainage and help keep the area  c. Keep the area clean and dry.  Bathe / shower every day.  Keep the area clean by showering / bathing over the incision / wound.   It is okay to soak an open wound to help wash it.  Consider using a squeeze bottle filled with warm water to gently wash the anal area.  Wet wipes or showers / gentle washing after bowel movements is often less traumatic than regular toilet paper. d. Dennis Bast will often notice bleeding with bowel movements.   This should slow down by the end of the first week of surgery.  Sitting on an ice pack can help. e. Expect some drainage.  This should slow down by the end of the first week of surgery, but you will have occasional bleeding or drainage up to a few months after surgery.  Wear an absorbent pad or soft cotton gauze in your underwear until the drainage stops.  6. ACTIVITIES as tolerated:   a. You may resume regular (light) daily activities beginning the next day--such as daily self-care, walking, climbing stairs--gradually increasing activities as tolerated.  If you can walk 30 minutes without difficulty, it is safe to try more intense activity such as jogging, treadmill, bicycling, low-impact aerobics, swimming, etc. b. Save the most intensive and strenuous activity for last such as sit-ups, heavy lifting, contact sports, etc  Refrain from any heavy lifting or straining  until you are off narcotics for pain control.   c. DO NOT PUSH THROUGH PAIN.  Let pain be your guide: If it hurts to do something, don't do it.  Pain is your body warning you to avoid that activity for another week until the pain goes down. d. You may drive when you are no longer taking prescription pain medication, you can comfortably sit for long periods of time, and you can safely maneuver your car and apply brakes. e. Bonita Quin may have sexual intercourse when it is comfortable.  7. FOLLOW UP in our office a. Please call [facility] at [phone] to set up an appointment to see your surgeon in the office for a follow-up appointment approximately 2-3 weeks after your surgery. b. Make sure that you call for this appointment the day you arrive home to ensure a convenient appointment time.  8. IF YOU HAVE DISABILITY OR FAMILY LEAVE FORMS, BRING THEM TO THE OFFICE FOR PROCESSING.  DO NOT GIVE THEM TO YOUR DOCTOR.        WHEN TO CALL us [phone]: 1. Poor pain control 2. Reactions / problems with new medications (rash/itching, nausea,  etc)  3. Fever over 101.5 F (38.5 C) 4. Inability to urinate 5. Nausea and/or vomiting 6. Worsening swelling or bruising 7. Continued bleeding from incision. 8. Increased pain, redness, or drainage from the incision  The clinic staff is available to answer your questions during regular business hours (8:30am-5pm).  Please don't hesitate to call and ask to speak to one of our nurses for clinical concerns.   A surgeon from [facility] is always on call at the hospitals   If you have a medical emergency, go to the nearest emergency room or call 911.    [facility] [address], [place], [place]  [address] ? MAIN: [phone] ? TOLL FREE: [phone] ? FAX [phone] [email or web address]   HEMORRHOIDS   Hemorrhoidal piles are natural clusters of blood vessels that help the rectum and anal canal stretch to hold stool and allow bowel movements.  Most people will develop a flare of hemorrhoids in their lifetime.  When hemorrhoidals are irritated, they can swell, burn, itch, cause pain, and bleed.  Most flares will calm down gradually within a few weeks.  However, once hemorrhoids are created, they tend to flare more easily.  Fortunately, good habits and simple medical treatment usually control hemorrhoids well, and surgery is needed only in severe cases.  TREATMENT OF HEMORRHOID FLARE Warm soaks. 4-8 times a day This helps more than any topical medication.   1. A sitz bath is a warm water bath taken in the sitting position that covers only the hips and buttocks.Fill the bathtub half full with warm water. 2. Soak in the water for 15 to 30 minutes. 3. After the sitz bath, pat the affected area dry first.  Normalize your bowels.  Extremes of diarrhea or constipation will make hemorrhoids worse.  One soft bowel movement a day is the goal.   Wet wipes instead of toilet paper Pain control with a NSAID such as ibuprofen (Advil) or  naproxen (Aleve) or acetaminophen (Tylenol) around the clock.  Narcotics are constipating and should be minimized if possible Topical creams contain steroids (bydrocortisone) or local anesthetic (xylocaine) can help make pain and itching more tolerable.    TROUBLESHOOTING IRREGULAR BOWELS 1) Avoid extremes of bowel movements (no bad constipation/diarrhea) 2) Miralax 17gm in 8oz. water or juice every day.  May use twice a day.  3) Gas-x or Phazyme as needed for gas & bloating.  4) Soft & bland diet. No spicy, greasy, or fried foods.  5) Omeprazole over-the-counter as needed  6) May hold gluten/wheat products from diet to see if symptoms improve.  7)  May try probiotics (Align, Activa, etc) to help calm the bowels down 7) If symptoms become worse: Call back immediately.   Post Anesthesia Home Care Instructions  Activity: Get plenty of rest for the remainder of the day. A responsible individual must stay with you for 24 hours following the procedure.  For the next 24 hours, DO NOT: -Drive a car -Paediatric nurse -Drink alcoholic beverages -Take any medication unless instructed by your physician -Make any legal decisions or sign important papers.  Meals: Start with liquid foods such as gelatin or soup. Progress to regular foods as tolerated. Avoid greasy, spicy, heavy foods. If nausea and/or vomiting occur, drink only clear liquids until the nausea and/or vomiting subsides. Call your physician if vomiting continues.  Special Instructions/Symptoms: Your throat may feel dry or sore from the anesthesia or the breathing tube placed in your throat during surgery. If this causes discomfort, gargle with warm salt water. The discomfort should disappear within 24 hours.  If you had a scopolamine patch placed behind your ear for the management of post- operative nausea and/or vomiting:  1. The medication in the patch is effective for 72 hours, after which it should be removed.  Wrap patch in a  tissue and discard in the trash. Wash hands thoroughly with soap and water. 2. You may remove the patch earlier than 72 hours if you experience unpleasant side effects which may include dry mouth, dizziness or visual disturbances. 3. Avoid touching the patch. Wash your hands with soap and water after contact with the patch.    Information for Discharge Teaching: EXPAREL (bupivacaine liposome injectable suspension)   Your surgeon or anesthesiologist gave you EXPAREL(bupivacaine) to help control your pain after surgery.   EXPAREL is a local anesthetic that provides pain relief by numbing the tissue around the surgical site.  EXPAREL is designed to release pain medication over time and can control pain for up to 72 hours.  Depending on how you respond to EXPAREL, you may require less pain medication during your recovery.  Possible side effects:  Temporary loss of sensation or ability to move in the area where bupivacaine was injected.  Nausea, vomiting, constipation  Rarely, numbness and tingling in your mouth or lips, lightheadedness, or anxiety may occur.  Call your doctor right away if you think you may be experiencing any of these sensations, or if you have other questions regarding possible side effects.  Follow all other discharge instructions given to you by your surgeon or nurse. Eat a healthy diet and drink plenty of water or other fluids.  If you return to the hospital for any reason within 96 hours following the administration of EXPAREL, it is important for health care providers to know that you have received this anesthetic. A teal colored band has been placed on your arm with the date, time and amount of EXPAREL you have received in order to alert and inform your health care providers. Please leave this armband in place for the full 96 hours following administration, and then you may remove the band.

## 2019-12-05 NOTE — Op Note (Signed)
12/05/2019  10:09 AM  PATIENT:  Jamie [name]  49 y.o. female  Patient Care Team: Riley Lam as PCP - General (General Practice) Gala Romney, Cristopher Estimable, MD as Consulting Physician (Gastroenterology) Michael Boston, MD as Consulting Physician (General Surgery)  PRE-OPERATIVE DIAGNOSIS:  Hemorrhoids prolapsing grade 3 with bleeding with pain  POST-OPERATIVE DIAGNOSIS:   Hemorrhoids prolapsing grade 3 with bleeding with pain External hemorrhoids Pruritis ani  PROCEDURE:  Procedure(s): ANORECTAL EXAM UNDER ANESTHESIA WITH HEMORRHOIDECTOMY, HEMORRHOIDAL LIGATION/PEXY   Internal and external hemorrhoidectomy  x2 Internal hemorrhoidal ligation and pexy Anorectal examination under anesthesia  SURGEON:  Adin Hector, MD  ANESTHESIA:   General Anorectal & Local field block (0.25% bupivacaine with epinephrine mixed with Liposomal bupivacaine (Experel)   EBL:  Total I/O In: 1115 [I.V.:915; IV Piggyback:200] Out: 25 [Blood:25].  See operative record  Delay start of Pharmacological VTE agent (>24hrs) due to surgical blood loss or risk of bleeding:  NO  DRAINS: NONE  SPECIMEN:   Internal & external hemorrhoidx3  DISPOSITION OF SPECIMEN:  PATHOLOGY  COUNTS:  YES  PLAN OF CARE: Discharge home after PACU  PATIENT DISPOSITION:  PACU - hemodynamically stable.  INDICATION: Pleasant patient with struggles with hemorrhoids.  Not able to be managed in the office despite an improved bowel regimen.  Some concern for possible anal fistula or fissure repair in the past but no strong evidence of recurrence.  I recommended examination under anesthesia and surgical treatment:  The anatomy & physiology of the anorectal region was discussed.  The pathophysiology of hemorrhoids and differential diagnosis was discussed.  Natural history risks without surgery was discussed.   I stressed the importance of a bowel regimen to have daily soft bowel movements to minimize progression of disease.   Interventions such as sclerotherapy & banding were discussed.  The patient's symptoms are not adequately controlled by medicines and other non-operative treatments.  I feel the risks & problems of no surgery outweigh the operative risks; therefore, I recommended surgery to treat the hemorrhoids by ligation, pexy, and possible resection.  Risks such as bleeding, infection, need for further treatment, heart attack, death, and other risks were discussed.   I noted a good likelihood this will help address the problem.  Goals of post-operative recovery were discussed as well.  Possibility that this will not correct all symptoms was explained.  Post-operative pain, bleeding, constipation, urinary difficulties, and other problems after surgery were discussed.  We will work to minimize complications.   Educational handouts further explaining the pathology, treatment options, and bowel regimen were given as well.  Questions were answered.  The patient expresses understanding & wishes to proceed with surgery.  OR FINDINGS: Right anterior greater than left posterior grade 4 hemorrhoids internal/external.  Inflamed right posterior hemorrhoid as well.  Rather patulous rectum but no true circumferential prolapse.  Mildly decreased but intact sphincter tone.  No evidence of any fissure, fistula,or abscess.  No condyloma.  No strong evidence of proctitis.  DESCRIPTION:   Informed consent was confirmed. Patient underwent general anesthesia without difficulty. Patient was placed into prone positioning.  The perianal region was prepped and draped in sterile fashion. Surgical time-out confirmed our plan.  I did digital rectal examination and then transitioned over to anoscopy to get a sense of the anatomy.  Findings noted above.   I proceeded to do hemorrhoidal ligation and pexy.  I used a 2-0 Vicryl suture on a UR-6 needle in a figure-of-eight fashion 6 cm proximal to the  anal verge.  I started at the largest  hemorrhoid pile.  Because of redundant hemorrhoidal tissue too bulky to merely ligate or pexy, I excised the excess internal hemorrhoid piles longitudinally in a fusiform biconcave fashion, at the  left posterior and right anterior locations, sparing the anal canal to avoid narrowing.  I then ran that stitch longitudinally more distally to close the hemorrhoidectomy wound to the anal verge over a Parks self retaining retractor & occasionally a large Hill-Furgeson retractor to avoid narrowing of the anal canal.  I then tied that stitch down to cause a hemorrhoidopexy.   I then did hemorrhoidal ligation and pexy at the other 4 columns.  I did upset excising some persistent thickened inflamed right posterior internal hemorrhoid as well.  At the completion of this, all 6 anorectal columns were ligated and pexied in the classic hexagonal fashion (right anterior/lateral/posterior, left anterior/lateral/posterior).  I closed the external part of the hemorrhoidectomy wounds with interrupted horizontal mattress 2-0 chromic suture, leaving the last 5 mm open to allow natural drainage.    I redid anoscopy & examination.  At completion of this, all hemorrhoids had been removed or reduced into the rectum.  There is no more prolapse.  Internal & external anatomy was more more normal.  Hemostasis was good.  Fluffed gauze was on-laid over the perianal region.  No packing done.  Patient is being extubated go to go to the recovery room.  I had discussed postop care in detail with the patient in the preop holding area.  Instructions for post-operative recovery and prescriptions are written. I discussed operative findings, updated the patient's status, discussed probable steps to recovery, and gave postoperative recommendations to the patient's mother.  Recommendations were made.  Questions were answered.  She expressed understanding & appreciation.  [name], M.D., F.A.C.S. Gastrointestinal and Minimally Invasive  Surgery [facility] [address], [address] [place], [place] [address] [phone] Main / Paging

## 2019-12-05 NOTE — Anesthesia Procedure Notes (Signed)
Procedure Name: Intubation Date/Time: 12/05/2019 8:40 AM Performed by: Elyn Peers, CRNA Pre-anesthesia Checklist: Patient identified, Emergency Drugs available, Suction available, Patient being monitored and Timeout performed Patient Re-evaluated:Patient Re-evaluated prior to induction Oxygen Delivery Method: Circle system utilized Preoxygenation: Pre-oxygenation with 100% oxygen Induction Type: IV induction Ventilation: Oral airway inserted - appropriate to patient size and Mask ventilation without difficulty Laryngoscope Size: Miller and 3 Grade View: Grade III Tube type: Oral Tube size: 7.0 mm Number of attempts: 2 Airway Equipment and Method: Stylet Placement Confirmation: ETT inserted through vocal cords under direct vision,  positive ETCO2 and breath sounds checked- equal and bilateral Secured at: 22 cm Tube secured with: Tape Dental Injury: Teeth and Oropharynx as per pre-operative assessment  Difficulty Due To: Difficulty was anticipated and Difficult Airway- due to anterior larynx Comments: Able to visualize bottom of laryngeal opening only.

## 2019-12-06 ENCOUNTER — Other Ambulatory Visit (HOSPITAL_COMMUNITY): Payer: Medicare Other

## 2019-12-06 ENCOUNTER — Encounter (HOSPITAL_COMMUNITY): Payer: Medicare Other

## 2019-12-06 LAB — SURGICAL PATHOLOGY

## 2019-12-10 ENCOUNTER — Ambulatory Visit (HOSPITAL_COMMUNITY): Admit: 2019-12-10 | Payer: Medicare Other | Admitting: Internal Medicine

## 2019-12-10 ENCOUNTER — Encounter (HOSPITAL_COMMUNITY): Payer: Self-pay

## 2019-12-10 SURGERY — ESOPHAGOGASTRODUODENOSCOPY (EGD) WITH PROPOFOL
Anesthesia: Monitor Anesthesia Care

## 2019-12-24 DIAGNOSIS — I10 Essential (primary) hypertension: Secondary | ICD-10-CM | POA: Diagnosis not present

## 2019-12-24 DIAGNOSIS — E119 Type 2 diabetes mellitus without complications: Secondary | ICD-10-CM | POA: Diagnosis not present

## 2019-12-24 DIAGNOSIS — Z7984 Long term (current) use of oral hypoglycemic drugs: Secondary | ICD-10-CM | POA: Diagnosis not present

## 2019-12-24 DIAGNOSIS — E7849 Other hyperlipidemia: Secondary | ICD-10-CM | POA: Diagnosis not present

## 2019-12-27 ENCOUNTER — Telehealth: Payer: Self-pay | Admitting: Internal Medicine

## 2019-12-27 NOTE — Telephone Encounter (Signed)
Called pt verified name and dob Pt stated she will contact pharmacy to send over a refill request form for [place] to refill zofran and bentyl Made an appt for her to see ab on 8/3 @3p  to reschedule EGD

## 2019-12-27 NOTE — Telephone Encounter (Signed)
Pt isn't currently scheduled for procedure as she previously cancelled. She will need OV to reschedule procedure since she was last seen 08/2019.

## 2019-12-27 NOTE — Telephone Encounter (Signed)
Pt called to reschedule her EGD and also asked for a refill on Zofran and Bentyl. She uses [facility]. Please call [phone]

## 2020-01-03 ENCOUNTER — Telehealth: Payer: Self-pay | Admitting: *Deleted

## 2020-01-03 NOTE — Telephone Encounter (Signed)
Pt called in and said that AB took her off Metformin 2000 mg daily( 09/18/19- ov with [place]) and pt said that her blood sugar was 434 yesterday.  It was 213 this morning before breakfast.  Pt said it is 264 today after lunch.  Pt said that she has been taking Bentyl for diarrhea but it isn't working anymore.  It hasn't been working for 2 weeks now per pt.  She said that she had recent surgery done by Dr. Michaell Cowing 4 wks ago and has stitches in her rectum.  Pt still with pain in her rectum.  Pt wants to know if she should go back on her diabetes medication or lower it.  Wants to know if any suggestions for diarrhea as well.

## 2020-01-03 NOTE — Telephone Encounter (Signed)
Informed pt that RMR thinks it sounds as though Metformin not related to diarrhea.  Informed pt that RMR recommends she get back on her original diabetic regimen.  RMR recommends an office visit to come back and reassess her GI symptoms.  Pt voiced understanding and moved her appt up to 01/30/20.

## 2020-01-03 NOTE — Telephone Encounter (Signed)
It sounds as though Metformin not related to diarrhea.  I recommend she get back on her original diabetic regimen lets get her an office visit to come back and reassess her GI symptoms

## 2020-01-06 ENCOUNTER — Other Ambulatory Visit (HOSPITAL_COMMUNITY): Payer: Self-pay | Admitting: Psychiatry

## 2020-01-07 ENCOUNTER — Other Ambulatory Visit (HOSPITAL_COMMUNITY): Payer: Medicare Other

## 2020-01-07 DIAGNOSIS — E119 Type 2 diabetes mellitus without complications: Secondary | ICD-10-CM | POA: Diagnosis not present

## 2020-01-07 DIAGNOSIS — I10 Essential (primary) hypertension: Secondary | ICD-10-CM | POA: Diagnosis not present

## 2020-01-07 DIAGNOSIS — K219 Gastro-esophageal reflux disease without esophagitis: Secondary | ICD-10-CM | POA: Diagnosis not present

## 2020-01-07 DIAGNOSIS — E782 Mixed hyperlipidemia: Secondary | ICD-10-CM | POA: Diagnosis not present

## 2020-01-09 ENCOUNTER — Telehealth (INDEPENDENT_AMBULATORY_CARE_PROVIDER_SITE_OTHER): Payer: Medicare Other | Admitting: Psychiatry

## 2020-01-09 ENCOUNTER — Ambulatory Visit: Payer: Medicare Other | Admitting: Primary Care

## 2020-01-09 ENCOUNTER — Other Ambulatory Visit: Payer: Self-pay

## 2020-01-09 DIAGNOSIS — F339 Major depressive disorder, recurrent, unspecified: Secondary | ICD-10-CM | POA: Diagnosis not present

## 2020-01-09 NOTE — Progress Notes (Signed)
Patient ID: Jamie [name], female   DOB: 12/28/1970, 49 y.o.   MRN: [id number] [facility] MD Progress Note  01/09/2020 4:24 PM Jamie [name]  MRN:  [id number] Subjective:  Feeling well. Principal Problem: Major depression recurrent  Today the patient seems to be only fairly well.  The good news is that she had abdominal surgery and had her mass removed.  It turned out to be benign.  She is still recovering.  In some ways she is very grateful and always she is still quick to the great deal by very erratic and crowded home life.  She has 7 people living with her.  She says it does not really bother her.  She gets dysphoric and she considers suicidal on a regular chronic basis.  She knows the level of when it is out of control and she shares with me that she will certainly go to the hospital like she is done many times in the past.  She does not think it is that bad.  She denies being acutely actively suicidal at this time.  She does feel somewhat depressed but is about half the time.  So is not every day.  She actually is sleeping and eating fairly well.  She is got a reasonable amount of energy.  The patient is on a CPAP machine.  She takes her medicines as prescribed.  She is not psychotic.  She does not drink any alcohol or use any drugs.  She has a reasonably good relationship with her husband.  Again the patient is recovering from abdominal surgery.  She has a therapist that she has regular contact with.  Contact however is on a as needed basis.  This patient will be reevaluated in 4 to 6 weeks.  Today we educated her about the new [facility].  We gave her the address and told her that it is a walk-in clinic.  I shared with her that she should go any time she felt very suicidal.  I want to lower her threshold to reaching out for help.  I want increase her availability to get assistance should she become acutely suicidal.  Once again she chronically considers and thinks about  suicide but not to the point where she would consider taking action at this time.  Past Medical History:  Diagnosis Date  . Anxiety   . Arthritis    knees  . Bipolar 1 disorder (HCC)   . Chronic diarrhea   . Diverticulosis of colon   . GERD (gastroesophageal reflux disease)   . Hiatal hernia   . History of concussion    per pt 11/ 2020 w/ no residual  . Hyperlipidemia   . Hypertension    followed by pcp   (11-29-2019  per pt stated never had stress test)  . MDD (major depressive disorder)   . Migraine    neurologist--- dr Terrace Arabia  . OSA on CPAP    followed by dr dohmeier  . PCOS (polycystic ovarian syndrome)   . PONV (postoperative nausea and vomiting)   . Type 2 diabetes mellitus (HCC)    followed by pcp   (11-29-2019  pt stated checks blood sugar daily in am,  fasting sugar--- 96    Past Surgical History:  Procedure Laterality Date  . ANAL FISSURE REPAIR  x3  last one  2007  approx.  [name] Kitchen BIOPSY  09/06/2019   Procedure: BIOPSY;  Surgeon: [name], MD;  Location: [facility];  Service: Endoscopy;;  . COLONOSCOPY WITH PROPOFOL [name] 09/06/2019   pancolonic diverticulosis. Segmental biopsy completed with benign biopsies.   [name] Kitchen EVALUATION UNDER ANESTHESIA WITH HEMORRHOIDECTOMY [name] 12/05/2019   Procedure: ANORECTAL EXAM UNDER ANESTHESIA WITH HEMORRHOIDECTOMY, HEMORRHOIDAL LIGATION/PEXY;  Surgeon: Michael Boston, MD;  Location: [facility];  Service: General;  Laterality: [name];  GENERAL AND LOCAL  . EVALUATION UNDER ANESTHESIA WITH TEAR DUCT PROBING  infant  . KNEE ARTHROSCOPY Right 1994  . LAPAROSCOPIC CHOLECYSTECTOMY  08-06-2002  @[facility]   . LUMBAR LAMINECTOMY/DECOMPRESSION MICRODISCECTOMY Right 01/22/2014   Procedure: LUMBAR FOUR TO FIVE LUMBAR LAMINECTOMY/DECOMPRESSION MICRODISCECTOMY 1 LEVEL;  Surgeon: Floyce Stakes, MD;  Location: [place] [facility];  Service: Neurosurgery;  Laterality: Right;  Right L45 diskectomy   Family History:  Family History  Problem Relation Age  of Onset  . Anxiety disorder Maternal Grandmother   . Depression Maternal Grandmother   . Colon cancer Neg Hx   . Colon polyps Neg Hx    Family Psychiatric  History:  Social History:  Social History   Substance and Sexual Activity  Alcohol Use No     Social History   Substance and Sexual Activity  Drug Use Never    Social History   Socioeconomic History  . Marital status: Married    Spouse name: Not on file  . Number of children: 0  . Years of education: college  . Highest education level: Not on file  Occupational History  . Occupation: disabled  Tobacco Use  . Smoking status: Never Smoker  . Smokeless tobacco: Never Used  Vaping Use  . Vaping Use: Never used  Substance and Sexual Activity  . Alcohol use: No  . Drug use: Never  . Sexual activity: Yes    Partners: Male    Birth control/protection: None  Other Topics Concern  . Not on file  Social History Narrative   Patient lives at home alone    Patient is right handed   Patient drinks soda's daily   Social Determinants of Health   Financial Resource Strain:   . Difficulty of Paying Living Expenses:   Food Insecurity:   . Worried About Charity fundraiser in the Last Year:   . Arboriculturist in the Last Year:   Transportation Needs:   . Film/video editor (Medical):   [name] Kitchen Lack of Transportation (Non-Medical):   Physical Activity:   . Days of Exercise per Week:   . Minutes of Exercise per Session:   Stress:   . Feeling of Stress :   Social Connections:   . Frequency of Communication with Friends and Family:   . Frequency of Social Gatherings with Friends and Family:   . Attends Religious Services:   . Active Member of Clubs or Organizations:   . Attends Archivist Meetings:   [name] Kitchen Marital Status:    Additional Social History:                         Sleep: Fair  Appetite:  Good  Current Medications: Current Outpatient Medications  Medication Sig Dispense Refill  .  ARIPiprazole (ABILIFY) 10 MG tablet Take 1 tablet (10 mg total) by mouth daily. 30 tablet 5  . benzonatate (TESSALON) 200 MG capsule Take 1 capsule (200 mg total) by mouth 3 (three) times daily as needed for cough. 60 capsule 1  . buPROPion (WELLBUTRIN XL) 150 MG 24 hr tablet Take 3 tablets (450 mg total) by  mouth daily. 90 tablet 5  . clonazePAM (KLONOPIN) 1 MG tablet Take 1-2 tablets (1-2 mg total) by mouth See admin instructions. Take 1 mg in the morning and 2 mg at night 90 tablet 4  . dicyclomine (BENTYL) 10 MG capsule Take 1 capsule (10 mg total) by mouth 4 (four) times daily -  before meals and at bedtime. 120 capsule 3  . DULoxetine (CYMBALTA) 60 MG capsule Take 2 capsules (120 mg total) by mouth at bedtime. 60 capsule 4  . FIBER PO Take by mouth daily.    . fluticasone (FLONASE) 50 MCG/ACT nasal spray Place 1 spray into both nostrils daily as needed for allergies or rhinitis.     [name] Kitchen lisinopril (PRINIVIL,ZESTRIL) 5 MG tablet Take 5 mg by mouth daily.    . Menthol, Topical Analgesic, (BIOFREEZE EX) Apply 1 application topically daily as needed (back pain).    . metFORMIN (GLUCOPHAGE-XR) 500 MG 24 hr tablet Take 2,000 mg by mouth daily.     . naproxen sodium (ALEVE) 220 MG tablet Take 220 mg by mouth 2 (two) times daily.    . ondansetron (ZOFRAN-ODT) 4 MG disintegrating tablet TAKE 1 TABLET BY MOUTH EVERY 8 HOURS AS NEEDED FOR NAUSEA AND VOMITING (Patient taking differently: Take 4 mg by mouth every 8 (eight) hours as needed. ) 40 tablet 3  . oxyCODONE (OXY IR/ROXICODONE) 5 MG immediate release tablet Take 1-2 tablets (5-10 mg total) by mouth every 6 (six) hours as needed for moderate pain, severe pain or breakthrough pain. 30 tablet 0  . pantoprazole (PROTONIX) 40 MG tablet Take 1 tablet (40 mg total) by mouth daily. Take 30 minutes before breakfast (Patient taking differently: Take 40 mg by mouth daily. Take 30 minutes before breakfast) 90 tablet 3  . rosuvastatin (CRESTOR) 20 MG tablet Take 10  mg by mouth daily.     [name] Kitchen topiramate (TOPAMAX) 50 MG tablet Take 1 tablet (50 mg total) by mouth 2 (two) times daily. 180 tablet 3  . traZODone (DESYREL) 100 MG tablet Take 4 tablets (400 mg total) by mouth at bedtime. 120 tablet 4   No current facility-administered medications for this visit.    Lab Results: No results found for this or any previous visit (from the past 48 hour(s)).  Physical Findings: AIMS:  , ,  ,  ,    CIWA:    COWS:     Musculoskeletal: Strength & Muscle Tone: within normal limits Gait & Station: normal Patient leans: Right  Psychiatric Specialty Exam: ROS  There were no vitals taken for this visit.There is no height or weight on file to calculate BMI.  General Appearance: Casual  Eye Contact::  Good  Speech:  Clear and Coherent  Volume:  Normal  Mood:Depressed  Affect:  Appropriate  Today the patient is doing very well. Thought Process:  Coherent  Orientation:  Full (Time, Place, and Person)  Thought Content:  NA and WDL  Suicidal Thoughts:  yes  Homicidal Thoughts:  No  Memory:  Immediate;   NA  Judgement:  Good  Insight:  Good  Psychomotor Activity:  NA  Concentration:  Good  Recall:  Good  Fund of Knowledge:Good  Language: Good  Akathisia:  No  Handed:  Right  AIMS (if indicated):     Assets:  Desire for Improvement  ADL's:  Intact  Cognition: WNL  Sleep:      Treatment Plan Summary: 01/09/2020, 4:24 PM This patient's major problem is that of major depression.  Should continue taking Cymbalta 120 mg, Wellbutrin 450 mg and Klonopin 1 mg 1 in the morning and 2 at night.  Should continue in psychotherapy.  I think the patient is safe and relatively stable.  I think chronic suicidal thinking is something she has had for years.  I think she has fairly good insight to know when he gets to a critical point and she is always reached out for help before taking any action to hurt herself.  She is not admitting to being acutely suicidal at this time and  she states that she will reach out if it gets worse.  Further now she has a new treatment intervention possibly going to the [facility] for care.  For me she will return to see me in 1 to 2 months.  I have asked her to be seen in person.

## 2020-01-15 DIAGNOSIS — M545 Low back pain: Secondary | ICD-10-CM | POA: Diagnosis not present

## 2020-01-15 DIAGNOSIS — I10 Essential (primary) hypertension: Secondary | ICD-10-CM | POA: Diagnosis not present

## 2020-01-15 DIAGNOSIS — Z0000 Encounter for general adult medical examination without abnormal findings: Secondary | ICD-10-CM | POA: Diagnosis not present

## 2020-01-15 DIAGNOSIS — K219 Gastro-esophageal reflux disease without esophagitis: Secondary | ICD-10-CM | POA: Diagnosis not present

## 2020-01-15 DIAGNOSIS — E782 Mixed hyperlipidemia: Secondary | ICD-10-CM | POA: Diagnosis not present

## 2020-01-17 ENCOUNTER — Other Ambulatory Visit (HOSPITAL_COMMUNITY): Payer: Self-pay | Admitting: Psychiatry

## 2020-01-21 ENCOUNTER — Telehealth (HOSPITAL_COMMUNITY): Payer: Self-pay

## 2020-01-21 ENCOUNTER — Ambulatory Visit: Payer: Medicare Other | Admitting: Acute Care

## 2020-01-21 NOTE — Telephone Encounter (Signed)
Patient called regarding deaths in her family that just happened and stated that she really needs to speak with you. She can be reached at [phone].

## 2020-01-23 DIAGNOSIS — E119 Type 2 diabetes mellitus without complications: Secondary | ICD-10-CM | POA: Diagnosis not present

## 2020-01-23 DIAGNOSIS — Z7984 Long term (current) use of oral hypoglycemic drugs: Secondary | ICD-10-CM | POA: Diagnosis not present

## 2020-01-23 DIAGNOSIS — I10 Essential (primary) hypertension: Secondary | ICD-10-CM | POA: Diagnosis not present

## 2020-01-23 DIAGNOSIS — E7849 Other hyperlipidemia: Secondary | ICD-10-CM | POA: Diagnosis not present

## 2020-01-30 ENCOUNTER — Ambulatory Visit: Payer: Medicare Other | Admitting: Gastroenterology

## 2020-02-05 DIAGNOSIS — R202 Paresthesia of skin: Secondary | ICD-10-CM | POA: Diagnosis not present

## 2020-02-05 DIAGNOSIS — R5383 Other fatigue: Secondary | ICD-10-CM | POA: Diagnosis not present

## 2020-02-05 DIAGNOSIS — E119 Type 2 diabetes mellitus without complications: Secondary | ICD-10-CM | POA: Diagnosis not present

## 2020-02-07 ENCOUNTER — Other Ambulatory Visit (HOSPITAL_COMMUNITY): Payer: Self-pay | Admitting: *Deleted

## 2020-02-21 NOTE — Progress Notes (Signed)
@Patient  ID: , female    DOB: 20-Jun-1971, 49 y.o.   MRN: [id number]  Chief Complaint  Patient presents with   Follow-up    SOB    Referring provider: [id number]  HPI: 49 year old female, never smoked. PMH significant for OSA on CPAP, chronic cough, hypertension, diabetes, hypercholesterolemia, allergies/sinusitis. Patient of Dr. 52, last seen in January 2021. On Protonix for reflux symptoms and tessalon perles prn for cough. Ordered for PFTs. Recommended follow-up in 4 weeks.  02/22/2020 Patient presents today for 6 month follow-up with PFTs.  She had CT abdomen in February that showed no acute findings lower chest. She reports experiencing pain across her back for the last year on inspiration rating 6/10 on pain scale. Associated dyspnea, wheezing and cough. Symptoms worsen on exertion. Cough is dry, non-productive. She does reports occasional difficulty swallowing where food gets stuck in her throat. No aspiration. She is compliant with Protonix. She had a CXR done at [facility] several months ago which she states had a "black spot" on it. Results are not available for review in Epic or care everywhere. Will repeat imaging today.   Pulmonary function testing: 02/22/2020 - FVC 2.39 (69%), FEV1 2.15 (78%), ratio 90, TLC 77%, DLCOcor 26.65 (129%) Interpretation: Mild restriction, no bronchodilator response. Normal diffusion capacity.    Allergies  Allergen Reactions   Codeine Hives and Swelling   Morphine And Related Swelling and Rash    Immunization History  Administered Date(s) Administered   Influenza Inj Mdck Quad Pf 04/26/2019   Moderna SARS-COVID-2 Vaccination 11/13/2019, 12/17/2019    Past Medical History:  Diagnosis Date   Anxiety    Arthritis    knees   Bipolar 1 disorder (HCC)    Chronic diarrhea    Diverticulosis of colon    GERD (gastroesophageal reflux disease)    Hiatal hernia    History of concussion    per pt 11/ 2020  w/ no residual   Hyperlipidemia    Hypertension    followed by pcp   (11-29-2019  per pt stated never had stress test)   MDD (major depressive disorder)    Migraine    neurologist--- dr 01-29-2020   OSA on CPAP    followed by dr dohmeier   PCOS (polycystic ovarian syndrome)    PONV (postoperative nausea and vomiting)    Type 2 diabetes mellitus (HCC)    followed by pcp   (11-29-2019  pt stated checks blood sugar daily in am,  fasting sugar--- 96    Tobacco History: Social History   Tobacco Use  Smoking Status Never Smoker  Smokeless Tobacco Never Used   Counseling given: Not Answered   Outpatient Medications Prior to Visit  Medication Sig Dispense Refill   ACCU-CHEK AVIVA PLUS test strip      Accu-Chek Softclix Lancets lancets AS DIRECTED TO CHECK BLOOD SUGAR ONCE TO TWICE DAILY     ARIPiprazole (ABILIFY) 10 MG tablet Take 1 tablet (10 mg total) by mouth daily. 30 tablet 5   buPROPion (WELLBUTRIN XL) 150 MG 24 hr tablet Take 3 tablets (450 mg total) by mouth daily. 90 tablet 5   clonazePAM (KLONOPIN) 1 MG tablet Take 1-2 tablets (1-2 mg total) by mouth See admin instructions. Take 1 mg in the morning and 2 mg at night 90 tablet 4   dicyclomine (BENTYL) 10 MG capsule Take 1 capsule (10 mg total) by mouth 4 (four) times daily -  before meals and at  bedtime. 120 capsule 3   DULoxetine (CYMBALTA) 60 MG capsule Take 2 capsules (120 mg total) by mouth at bedtime. 60 capsule 4   FIBER PO Take by mouth daily.     fluticasone (FLONASE) 50 MCG/ACT nasal spray Place 1 spray into both nostrils daily as needed for allergies or rhinitis.      gabapentin (NEURONTIN) 100 MG capsule Take 100 mg by mouth daily.     lisinopril (PRINIVIL,ZESTRIL) 5 MG tablet Take 5 mg by mouth daily.     Menthol, Topical Analgesic, (BIOFREEZE EX) Apply 1 application topically daily as needed (back pain).     metFORMIN (GLUCOPHAGE-XR) 500 MG 24 hr tablet Take 2,000 mg by mouth daily.      naproxen  sodium (ALEVE) 220 MG tablet Take 220 mg by mouth 2 (two) times daily.     ondansetron (ZOFRAN-ODT) 4 MG disintegrating tablet TAKE 1 TABLET BY MOUTH EVERY 8 HOURS AS NEEDED FOR NAUSEA AND VOMITING (Patient taking differently: Take 4 mg by mouth every 8 (eight) hours as needed. ) 40 tablet 3   oxyCODONE (OXY IR/ROXICODONE) 5 MG immediate release tablet Take 1-2 tablets (5-10 mg total) by mouth every 6 (six) hours as needed for moderate pain, severe pain or breakthrough pain. 30 tablet 0   pantoprazole (PROTONIX) 40 MG tablet Take 1 tablet (40 mg total) by mouth daily. Take 30 minutes before breakfast (Patient taking differently: Take 40 mg by mouth daily. Take 30 minutes before breakfast) 90 tablet 3   rosuvastatin (CRESTOR) 20 MG tablet Take 10 mg by mouth daily.      topiramate (TOPAMAX) 50 MG tablet Take 1 tablet (50 mg total) by mouth 2 (two) times daily. 180 tablet 3   traZODone (DESYREL) 100 MG tablet TAKE 4 TABLETS BY MOUTH AT BEDTIME 120 tablet 4   TRULICITY 0.75 MG/0.5ML SOPN SMARTSIG:0.5 Milliliter(s) SUB-Q Once a Week     benzonatate (TESSALON) 200 MG capsule Take 1 capsule (200 mg total) by mouth 3 (three) times daily as needed for cough. 60 capsule 1   No facility-administered medications prior to visit.   Review of Systems  Review of Systems  Constitutional: Negative.   Respiratory: Positive for cough, shortness of breath and wheezing.   Cardiovascular: Negative.  Negative for chest pain, palpitations and leg swelling.  Musculoskeletal: Positive for back pain.    Physical Exam  BP 120/70 (BP Location: Left Arm, Cuff Size: Normal)    Pulse 92    Temp (!) 97.2 F (36.2 C) (Oral)    Ht 5\' 3"  (1.6 m)    Wt (!) 246 lb (111.6 kg)    SpO2 96%    BMI 43.58 kg/m  Physical Exam Constitutional:      General: She is not in acute distress.    Appearance: Normal appearance. She is obese. She is not ill-appearing.  HENT:     Head: Normocephalic and atraumatic.  Cardiovascular:       Rate and Rhythm: Normal rate and regular rhythm.  Pulmonary:     Effort: Pulmonary effort is normal.     Breath sounds: No wheezing or rhonchi.     Comments: CTA Musculoskeletal:        General: Normal range of motion.  Neurological:     General: No focal deficit present.     Mental Status: She is alert and oriented to person, place, and time. Mental status is at baseline.  Psychiatric:        Mood and Affect: Mood  normal.        Behavior: Behavior normal.        Thought Content: Thought content normal.        Judgment: Judgment normal.      Lab Results:  CBC    Component Value Date/Time   WBC 11.3 (H) 02/22/2020 1145   RBC 4.90 02/22/2020 1145   HGB 13.5 02/22/2020 1145   HGB 13.2 09/16/2011 1753   HCT 41.9 02/22/2020 1145   HCT 39.3 09/16/2011 1753   PLT 221.0 02/22/2020 1145   PLT 204 09/16/2011 1753   MCV 85.5 02/22/2020 1145   MCV 89 09/16/2011 1753   MCH 28.3 05/17/2018 2013   MCHC 32.3 02/22/2020 1145   RDW 16.0 (H) 02/22/2020 1145   RDW 14.4 09/16/2011 1753   LYMPHSABS 2.6 02/22/2020 1145   MONOABS 0.8 02/22/2020 1145   EOSABS 0.1 02/22/2020 1145   BASOSABS 0.0 02/22/2020 1145    BMET    Component Value Date/Time   NA 138 12/05/2019 0723   NA 138 09/16/2011 1753   K 4.2 12/05/2019 0723   K 3.9 09/16/2011 1753   CL 97 (L) 12/05/2019 0723   CL 103 09/16/2011 1753   CO2 23 05/17/2018 2013   CO2 23 09/16/2011 1753   GLUCOSE 223 (H) 12/05/2019 0723   GLUCOSE 93 09/16/2011 1753   BUN 18 12/05/2019 0723   BUN 14 09/16/2011 1753   CREATININE 1.20 (H) 12/05/2019 0723   CREATININE 0.95 09/16/2011 1753   CALCIUM 9.2 05/17/2018 2013   CALCIUM 9.1 09/16/2011 1753   GFRNONAA >60 05/17/2018 2013   GFRNONAA >60 09/16/2011 1753   GFRAA >60 05/17/2018 2013   GFRAA >60 09/16/2011 1753    BNP No results found for: BNP  ProBNP No results found for: PROBNP  Imaging: No results found.   Assessment & Plan:   Wheezing Patient reports chronic dry  cough and wheezing. PFTs today showed mild restriction which could be related to body habitus or underlying asthma, however, she had no bronchodilator response on PFTs and her FENO was normal. She does report improvement in symptoms with SABA. Flow loop showed possible evidence for VCD. We will check additional labs and CXR. If normal may consider additional cardiac work up and/or referral to ENT to assess for VCD.   Recommendations: - Use albuterol rescue inhaler 2 puffs every 4-6 hours for breakthrough shortness of breath or wheezing  Orders: - FENO and Labs today (cbc with diff, IgE, d-dimer) - CXR at [facility] office re: shortness of breath   Follow-up: - We will call you next week with testing results;  3 months with Dr. Daleen Squibb, NP 02/22/2020

## 2020-02-22 ENCOUNTER — Ambulatory Visit: Payer: Medicare Other | Admitting: Primary Care

## 2020-02-22 ENCOUNTER — Other Ambulatory Visit: Payer: Self-pay

## 2020-02-22 ENCOUNTER — Ambulatory Visit (INDEPENDENT_AMBULATORY_CARE_PROVIDER_SITE_OTHER): Payer: Medicare Other | Admitting: Pulmonary Disease

## 2020-02-22 ENCOUNTER — Encounter: Payer: Self-pay | Admitting: Primary Care

## 2020-02-22 VITALS — BP 120/70 | HR 92 | Temp 97.2°F | Ht 63.0 in | Wt 246.0 lb

## 2020-02-22 DIAGNOSIS — I10 Essential (primary) hypertension: Secondary | ICD-10-CM | POA: Diagnosis not present

## 2020-02-22 DIAGNOSIS — R0602 Shortness of breath: Secondary | ICD-10-CM | POA: Diagnosis not present

## 2020-02-22 DIAGNOSIS — E119 Type 2 diabetes mellitus without complications: Secondary | ICD-10-CM | POA: Diagnosis not present

## 2020-02-22 DIAGNOSIS — R062 Wheezing: Secondary | ICD-10-CM | POA: Diagnosis not present

## 2020-02-22 DIAGNOSIS — Z7984 Long term (current) use of oral hypoglycemic drugs: Secondary | ICD-10-CM | POA: Diagnosis not present

## 2020-02-22 DIAGNOSIS — E7849 Other hyperlipidemia: Secondary | ICD-10-CM | POA: Diagnosis not present

## 2020-02-22 LAB — PULMONARY FUNCTION TEST
DL/VA % pred: 186 %
DL/VA: 8.16 ml/min/mmHg/L
DLCO cor % pred: 129 %
DLCO cor: 26.65 ml/min/mmHg
DLCO unc % pred: 129 %
DLCO unc: 26.65 ml/min/mmHg
FEF 25-75 Post: 3.33 L/sec
FEF 25-75 Pre: 2.65 L/sec
FEF2575-%Change-Post: 25 %
FEF2575-%Pred-Post: 119 %
FEF2575-%Pred-Pre: 95 %
FEV1-%Change-Post: 4 %
FEV1-%Pred-Post: 78 %
FEV1-%Pred-Pre: 74 %
FEV1-Post: 2.15 L
FEV1-Pre: 2.06 L
FEV1FVC-%Change-Post: 3 %
FEV1FVC-%Pred-Pre: 108 %
FEV6-%Change-Post: 1 %
FEV6-%Pred-Post: 70 %
FEV6-%Pred-Pre: 69 %
FEV6-Post: 2.39 L
FEV6-Pre: 2.36 L
FEV6FVC-%Pred-Post: 102 %
FEV6FVC-%Pred-Pre: 102 %
FVC-%Change-Post: 1 %
FVC-%Pred-Post: 69 %
FVC-%Pred-Pre: 68 %
FVC-Post: 2.39 L
FVC-Pre: 2.36 L
Post FEV1/FVC ratio: 90 %
Post FEV6/FVC ratio: 100 %
Pre FEV1/FVC ratio: 88 %
Pre FEV6/FVC Ratio: 100 %
RV % pred: 89 %
RV: 1.52 L
TLC % pred: 77 %
TLC: 3.8 L

## 2020-02-22 LAB — CBC WITH DIFFERENTIAL/PLATELET
Basophils Absolute: 0 10*3/uL (ref 0.0–0.1)
Basophils Relative: 0.2 % (ref 0.0–3.0)
Eosinophils Absolute: 0.1 10*3/uL (ref 0.0–0.7)
Eosinophils Relative: 0.6 % (ref 0.0–5.0)
HCT: 41.9 % (ref 36.0–46.0)
Hemoglobin: 13.5 g/dL (ref 12.0–15.0)
Lymphocytes Relative: 23 % (ref 12.0–46.0)
Lymphs Abs: 2.6 10*3/uL (ref 0.7–4.0)
MCHC: 32.3 g/dL (ref 30.0–36.0)
MCV: 85.5 fl (ref 78.0–100.0)
Monocytes Absolute: 0.8 10*3/uL (ref 0.1–1.0)
Monocytes Relative: 6.8 % (ref 3.0–12.0)
Neutro Abs: 7.9 10*3/uL — ABNORMAL HIGH (ref 1.4–7.7)
Neutrophils Relative %: 69.4 % (ref 43.0–77.0)
Platelets: 221 10*3/uL (ref 150.0–400.0)
RBC: 4.9 Mil/uL (ref 3.87–5.11)
RDW: 16 % — ABNORMAL HIGH (ref 11.5–15.5)
WBC: 11.3 10*3/uL — ABNORMAL HIGH (ref 4.0–10.5)

## 2020-02-22 LAB — NITRIC OXIDE: Nitric Oxide: 7

## 2020-02-22 MED ORDER — ALBUTEROL SULFATE HFA 108 (90 BASE) MCG/ACT IN AERS
2.0000 | INHALATION_SPRAY | Freq: Four times a day (QID) | RESPIRATORY_TRACT | 1 refills | Status: DC | PRN
Start: 2020-02-22 — End: 2020-04-04

## 2020-02-22 NOTE — Progress Notes (Signed)
Full PFT performed today. °

## 2020-02-22 NOTE — Assessment & Plan Note (Addendum)
Patient reports chronic dry cough and wheezing. PFTs today showed mild restriction which could be related to body habitus or underlying asthma, however, she had no bronchodilator response on PFTs and her FENO was normal. She does report improvement in symptoms with SABA. Flow loop showed possible evidence for VCD. We will check additional labs and CXR. If normal may consider additional cardiac work up and/or referral to ENT to assess for VCD.   Recommendations: - Use albuterol rescue inhaler 2 puffs every 4-6 hours for breakthrough shortness of breath or wheezing  Orders: - FENO and Labs today (cbc with diff, IgE, d-dimer) - CXR at [facility] office re: shortness of breath   Follow-up: - We will call you next week with testing results;  3 months with Dr. Wynona Neat

## 2020-02-22 NOTE — Patient Instructions (Addendum)
Pleasure meeting you today Jamie [name]  PFTs today showed mild restriction which could be related to body habitus or asthma. No evidence of COPD. FENO was normal today. We will check additional labs to rule in/out asthma. If normal may consider additional cardiac work up   Recommendations: - Use albuterol rescue inhaler 2 puffs every 4-6 hours for breakthrough shortness of breath or wheezing  Orders: - FENO and Labs today (cbc with diff, IgE, d-dimer) - CXR at [facility] office re: shortness of breath   Follow-up: We will call you next week with testing results  Follow-up in 3 months with Dr. Wynona Neat

## 2020-02-25 DIAGNOSIS — R300 Dysuria: Secondary | ICD-10-CM | POA: Diagnosis not present

## 2020-02-25 DIAGNOSIS — N390 Urinary tract infection, site not specified: Secondary | ICD-10-CM | POA: Diagnosis not present

## 2020-02-25 DIAGNOSIS — Z7689 Persons encountering health services in other specified circumstances: Secondary | ICD-10-CM | POA: Diagnosis not present

## 2020-02-25 LAB — D-DIMER, QUANTITATIVE: D-Dimer, Quant: 0.2 mcg/mL FEU (ref <0.50)

## 2020-02-25 LAB — IGE: IgE (Immunoglobulin E), Serum: 17 kU/L (ref <=114)

## 2020-02-25 NOTE — Progress Notes (Signed)
Please let patient know labs looked ok. IgE and eosinophils was normal, D-dimer was normal. WBC was 11, which is slightly elevated could be from viral or bacterial infection. Did she have CXR?

## 2020-02-25 NOTE — Progress Notes (Signed)
Thanks

## 2020-02-26 ENCOUNTER — Ambulatory Visit: Payer: Medicare Other | Admitting: Gastroenterology

## 2020-02-26 ENCOUNTER — Other Ambulatory Visit: Payer: Self-pay

## 2020-02-26 ENCOUNTER — Ambulatory Visit (INDEPENDENT_AMBULATORY_CARE_PROVIDER_SITE_OTHER): Payer: Medicare Other

## 2020-02-26 DIAGNOSIS — R0602 Shortness of breath: Secondary | ICD-10-CM

## 2020-02-26 DIAGNOSIS — R0600 Dyspnea, unspecified: Secondary | ICD-10-CM | POA: Diagnosis not present

## 2020-02-26 DIAGNOSIS — R062 Wheezing: Secondary | ICD-10-CM | POA: Diagnosis not present

## 2020-02-27 ENCOUNTER — Ambulatory Visit: Payer: Medicare Other | Admitting: Adult Health

## 2020-02-27 NOTE — Progress Notes (Signed)
Please let patient know CXR showed clear lungs, no active cardiopulmonary disease

## 2020-03-05 ENCOUNTER — Telehealth (INDEPENDENT_AMBULATORY_CARE_PROVIDER_SITE_OTHER): Payer: Medicare Other | Admitting: Psychiatry

## 2020-03-05 ENCOUNTER — Other Ambulatory Visit: Payer: Self-pay

## 2020-03-05 DIAGNOSIS — F324 Major depressive disorder, single episode, in partial remission: Secondary | ICD-10-CM

## 2020-03-05 DIAGNOSIS — F323 Major depressive disorder, single episode, severe with psychotic features: Secondary | ICD-10-CM

## 2020-03-05 MED ORDER — DULOXETINE HCL 60 MG PO CPEP: 120.0000 mg | ORAL_CAPSULE | Freq: Every day | ORAL | 4 refills | Status: DC

## 2020-03-05 MED ORDER — BUPROPION HCL ER (XL) 150 MG PO TB24: 450.0000 mg | ORAL_TABLET | Freq: Every day | ORAL | 5 refills | Status: DC

## 2020-03-05 MED ORDER — TRAZODONE HCL 100 MG PO TABS: 400.0000 mg | ORAL_TABLET | Freq: Every day | ORAL | 4 refills | Status: DC

## 2020-03-05 MED ORDER — CLONAZEPAM 1 MG PO TABS: 1.0000 mg | ORAL_TABLET | ORAL | 4 refills | Status: DC

## 2020-03-05 MED ORDER — ARIPIPRAZOLE 10 MG PO TABS: 10.0000 mg | ORAL_TABLET | Freq: Every day | ORAL | 5 refills | Status: DC
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
## 2020-03-13 DIAGNOSIS — K219 Gastro-esophageal reflux disease without esophagitis: Secondary | ICD-10-CM | POA: Diagnosis not present

## 2020-03-13 DIAGNOSIS — E782 Mixed hyperlipidemia: Secondary | ICD-10-CM | POA: Diagnosis not present

## 2020-03-13 DIAGNOSIS — I10 Essential (primary) hypertension: Secondary | ICD-10-CM | POA: Diagnosis not present

## 2020-03-13 DIAGNOSIS — E119 Type 2 diabetes mellitus without complications: Secondary | ICD-10-CM | POA: Diagnosis not present

## 2020-03-13 DIAGNOSIS — R0600 Dyspnea, unspecified: Secondary | ICD-10-CM | POA: Diagnosis not present

## 2020-03-19 NOTE — Progress Notes (Signed)
Referring Provider: Joeseph Amor Primary Care Physician:  Joeseph Amor Primary GI Physician: Dr. [name]  Chief Complaint  Patient presents with  . Diarrhea    chest pain, consult for EGD, hiatal hernia    HPI:   Jamie [name] is a 49 y.o. female presenting today for her follow-up of chronic diarrhea, chronic epigastric abdominal pain and to discuss rescheduling EGD.  Also with history of nausea, and GERD.  Stool studies including culture, O&P, and C. difficile negative in October 2020.  Colonoscopy with negative segmental biopsies in February 2021.  She was last seen in our office 09/18/2019.  She had nausea dating back to early 2000s with no clear etiology.  No prior GES.  She was started on trial of Protonix at the visit prior.  No longer waking up at night with GERD.  Esophageal burning resolved.  Nausea has also improved somewhat.  She reported upper abdominal discomfort that was constantly present but waxed and waned.  No association with BMs or meals.  Had called and left a message with her PCP to discuss trial of holding Metformin.  Noted poor appetite.  Regarding diarrhea, she was taking Bentyl 4 times daily.  This decreased BMs from 10/day to 3/day.  Primary concern was nocturnal stools with incontinence.  Plan included continuing Protonix, continuing Bentyl, add pancreatic enzymes 2 capsules with meals and 1 with snacks, needed trial off metformin, CT in the near future, and consider EGD with small bowel biopsies if CT is unrevealing.   CT 09/19/19: No acute findings. Has fatty liver. Linear soft tissue density in left perianal soft tissues is suspicious for perianal fistula, but could be active or chronic. No evidence of abscess.   Follow-up MRI Pelvis 10/16/19: Grade 3 transsphincteric perianal fistula arising from the 5:30 position and exiting via the left gluteal cleft. No abscess.   She was referred to [facility] and scheduled for EGD.  Patient  underwent internal and external hemorrhoidectomy x2, internal hemorrhoid ligation and pexy, and anorectal exam under anesthesia on 12/05/19.  No evidence of any fissure, fistula,or abscess.   She cancelled her EGD.   Today:  Central chest pain. Mostly constant. Worsens with exertion. Also with shortness of breath which has been constant as well.  She has seen pulmonology, and they have provided an inhaler.  Inhaler helps somewhat with shortness of breath but doesn't affect the chest discomfort.  Chest discomfort and shortness of breath have been going on since September 2020.  She has upcoming appointment with cardiology on 9/2.   Constant epigastric discomfort. Worse with eating. Doesn't matter what she eats. Pressure. Not sharp. Improves in a couple of hours after eating. No right sided pain. History of cholecystectomy. Associated nausea. No vomiting. Has early satiety.  Symptoms started in November 2020.  No significant change since we saw her in February. No GERD symptoms. Taking Protonix 40 mg daily. Protonix didn't affect abdominal pain or nausea.  No black stools. Taking Zofran 1-2 times a day.   Intermittent solid food dysphagia occurring once every couple of weeks. Occasionally feels water goes the wrong way.    Diarrhea:  Improved. Having 1-2 BMs daily. Stools are soft and formed. No blood in the stool. Only with 1-2 incontinent episodes since her hemorrhoid surgery.  Feels surgery helped quite a bit.  Bentyl TID before meals and at bedtime. Took pancreatic enzymes for about 3 months. They helped some. When she was taking enzymes and  bentyl, she was having intermittent constipation. Prior to Bentyl, she was having a lot of abdominal cramping. Bentyl has helped with this as well. Rare dairy products. Trying to follow a low fat diet.   Past Medical History:  Diagnosis Date  . Anxiety   . Arthritis    knees  . Bipolar 1 disorder (HCC)   . Chronic diarrhea   . Diverticulosis of colon   .  GERD (gastroesophageal reflux disease)   . Hiatal hernia   . History of concussion    per pt 11/ 2020 w/ no residual  . Hyperlipidemia   . Hypertension    followed by pcp   (11-29-2019  per pt stated never had stress test)  . MDD (major depressive disorder)   . Migraine    neurologist--- dr Terrace Arabia  . OSA on CPAP    followed by dr dohmeier  . PCOS (polycystic ovarian syndrome)   . PONV (postoperative nausea and vomiting)   . Type 2 diabetes mellitus (HCC)    followed by pcp   (11-29-2019  pt stated checks blood sugar daily in am,  fasting sugar--- 96    Past Surgical History:  Procedure Laterality Date  . ANAL FISSURE REPAIR  x3  last one  2007  approx.  [name] Kitchen BIOPSY  09/06/2019   Procedure: BIOPSY;  Surgeon: [name], MD;  Location: [facility];  Service: Endoscopy;;  . COLONOSCOPY WITH PROPOFOL [name] 09/06/2019   pancolonic diverticulosis. Segmental biopsy completed with benign biopsies.   [name] Kitchen EVALUATION UNDER ANESTHESIA WITH HEMORRHOIDECTOMY [name] 12/05/2019   Procedure: ANORECTAL EXAM UNDER ANESTHESIA WITH HEMORRHOIDECTOMY, HEMORRHOIDAL LIGATION/PEXY;  Surgeon: [name], MD;  Location: [facility];  Service: General;  Laterality: [name];  GENERAL AND LOCAL  . EVALUATION UNDER ANESTHESIA WITH TEAR DUCT PROBING  infant  . KNEE ARTHROSCOPY Right 1994  . LAPAROSCOPIC CHOLECYSTECTOMY  08-06-2002  @[facility]   . LUMBAR LAMINECTOMY/DECOMPRESSION MICRODISCECTOMY Right 01/22/2014   Procedure: LUMBAR FOUR TO FIVE LUMBAR LAMINECTOMY/DECOMPRESSION MICRODISCECTOMY 1 LEVEL;  Surgeon: 01/24/2014, MD;  Location: [facility];  Service: Neurosurgery;  Laterality: Right;  Right L45 diskectomy    Current Outpatient Medications  Medication Sig Dispense Refill  . ACCU-CHEK AVIVA PLUS test strip     . Accu-Chek Softclix Lancets lancets AS DIRECTED TO CHECK BLOOD SUGAR ONCE TO TWICE DAILY    . albuterol (VENTOLIN HFA) 108 (90 Base) MCG/ACT inhaler Inhale 2 puffs into the lungs every 6 (six)  hours as needed for wheezing or shortness of breath. 18 g 1  . ARIPiprazole (ABILIFY) 10 MG tablet Take 1 tablet (10 mg total) by mouth daily. 30 tablet 5  . buPROPion (WELLBUTRIN XL) 150 MG 24 hr tablet Take 3 tablets (450 mg total) by mouth daily. 90 tablet 5  . clonazePAM (KLONOPIN) 1 MG tablet Take 1-2 tablets (1-2 mg total) by mouth See admin instructions. Take 1 mg in the morning and 2 mg at night 90 tablet 4  . dicyclomine (BENTYL) 10 MG capsule Take 1 capsule (10 mg total) by mouth 4 (four) times daily -  before meals and at bedtime. 120 capsule 3  . DULoxetine (CYMBALTA) 60 MG capsule Take 2 capsules (120 mg total) by mouth at bedtime. 60 capsule 4  . FIBER PO Take by mouth daily.    . fluticasone (FLONASE) 50 MCG/ACT nasal spray Place 1 spray into both nostrils daily as needed for allergies or rhinitis.     Karn Cassis gabapentin (NEURONTIN) 100 MG capsule  Take 200 mg by mouth daily.     [name] Kitchen lisinopril (PRINIVIL,ZESTRIL) 5 MG tablet Take 5 mg by mouth daily.    [name] Kitchen loperamide (IMODIUM) 2 MG capsule Take by mouth as needed for diarrhea or loose stools.    . Menthol, Topical Analgesic, (BIOFREEZE EX) Apply 1 application topically daily as needed (back pain).    . metFORMIN (GLUCOPHAGE-XR) 500 MG 24 hr tablet Take 500 mg by mouth daily.     . naproxen sodium (ALEVE) 220 MG tablet Take 220 mg by mouth 2 (two) times daily.    . ondansetron (ZOFRAN-ODT) 4 MG disintegrating tablet TAKE 1 TABLET BY MOUTH EVERY 8 HOURS AS NEEDED FOR NAUSEA AND VOMITING (Patient taking differently: Take 4 mg by mouth as needed. ) 40 tablet 3  . pantoprazole (PROTONIX) 40 MG tablet Take 1 tablet (40 mg total) by mouth daily. Take 30 minutes before breakfast (Patient taking differently: Take 40 mg by mouth daily. Take 30 minutes before breakfast) 90 tablet 3  . rosuvastatin (CRESTOR) 20 MG tablet Take 10 mg by mouth daily.     . Semaglutide,0.25 or 0.5MG /DOS, (OZEMPIC, 0.25 OR 0.5 MG/DOSE,) 2 MG/1.5ML SOPN Inject into the skin once  a week.    . topiramate (TOPAMAX) 50 MG tablet Take 1 tablet (50 mg total) by mouth 2 (two) times daily. 180 tablet 3  . traZODone (DESYREL) 100 MG tablet Take 4 tablets (400 mg total) by mouth at bedtime. 120 tablet 4   No current facility-administered medications for this visit.    Allergies as of 03/20/2020 - Review Complete 03/20/2020  Allergen Reaction Noted  . Codeine Hives and Swelling 06/07/2011  . Morphine and related Swelling and Rash 01/21/2014    Family History  Problem Relation Age of Onset  . Anxiety disorder Maternal Grandmother   . Depression Maternal Grandmother   . Colon cancer Neg Hx   . Colon polyps Neg Hx     Social History   Socioeconomic History  . Marital status: Married    Spouse name: Not on file  . Number of children: 0  . Years of education: college  . Highest education level: Not on file  Occupational History  . Occupation: disabled  Tobacco Use  . Smoking status: Never Smoker  . Smokeless tobacco: Never Used  Vaping Use  . Vaping Use: Never used  Substance and Sexual Activity  . Alcohol use: No  . Drug use: Never  . Sexual activity: Yes    Partners: Male    Birth control/protection: None  Other Topics Concern  . Not on file  Social History Narrative   Patient lives at home alone    Patient is right handed   Patient drinks soda's daily   Social Determinants of Health   Financial Resource Strain:   . Difficulty of Paying Living Expenses: Not on file  Food Insecurity:   . Worried About Programme researcher, broadcasting/film/video in the Last Year: Not on file  . Ran Out of Food in the Last Year: Not on file  Transportation Needs:   . Lack of Transportation (Medical): Not on file  . Lack of Transportation (Non-Medical): Not on file  Physical Activity:   . Days of Exercise per Week: Not on file  . Minutes of Exercise per Session: Not on file  Stress:   . Feeling of Stress : Not on file  Social Connections:   . Frequency of Communication with Friends  and Family: Not on file  . Frequency  of Social Gatherings with Friends and Family: Not on file  . Attends Religious Services: Not on file  . Active Member of Clubs or Organizations: Not on file  . Attends BankerClub or Organization Meetings: Not on file  . Marital Status: Not on file    Review of Systems: Gen: Denies fever, chills, cold or flulike symptoms, presyncope, syncope. CV: Denies heart palpitations. Resp: Denies cough. GI: See HPI Heme: See HPI  Physical Exam: BP 126/85   Pulse (!) 112   Temp (!) 97.3 F (36.3 C) (Oral)   Ht 5\' 3"  (1.6 m)   Wt 247 lb 6.4 oz (112.2 kg)   BMI 43.82 kg/m  General:   Alert and oriented. No distress noted. Pleasant and cooperative.  Head:  Normocephalic and atraumatic. Eyes:  Conjuctiva clear without scleral icterus. Heart:  S1, S2 present without murmurs appreciated. Lungs:  Clear to auscultation bilaterally. No wheezes, rales, or rhonchi. No distress.  Abdomen:  +BS, soft, and non-distended.  Mild epigastric tenderness to palpation.  No rebound or guarding. No HSM or masses noted. Msk:  Symmetrical without gross deformities. Normal posture. Extremities:  Without edema. Neurologic:  Alert and  oriented x4 Psych: Normal mood and affect.

## 2020-03-20 ENCOUNTER — Encounter: Payer: Self-pay | Admitting: Gastroenterology

## 2020-03-20 ENCOUNTER — Ambulatory Visit: Payer: Medicare Other | Admitting: Gastroenterology

## 2020-03-20 ENCOUNTER — Other Ambulatory Visit: Payer: Self-pay

## 2020-03-20 VITALS — BP 126/85 | HR 112 | Temp 97.3°F | Ht 63.0 in | Wt 247.4 lb

## 2020-03-20 DIAGNOSIS — R1013 Epigastric pain: Secondary | ICD-10-CM | POA: Diagnosis not present

## 2020-03-20 DIAGNOSIS — K529 Noninfective gastroenteritis and colitis, unspecified: Secondary | ICD-10-CM

## 2020-03-20 DIAGNOSIS — R1310 Dysphagia, unspecified: Secondary | ICD-10-CM | POA: Diagnosis not present

## 2020-03-20 DIAGNOSIS — G8929 Other chronic pain: Secondary | ICD-10-CM | POA: Diagnosis not present

## 2020-03-20 DIAGNOSIS — K219 Gastro-esophageal reflux disease without esophagitis: Secondary | ICD-10-CM | POA: Insufficient documentation

## 2020-03-20 NOTE — Assessment & Plan Note (Addendum)
Chronic diarrhea with associated abdominal cramping significantly improved with Bentyl 3 times daily with meals and at bedtime.  Also notes recent hemorrhoid surgery has helped quite a bit with incontinent episodes.  Currently with 1-2 soft, formed BMs daily.  No BRBPR or melena.  Colonoscopy up-to-date in February 2021 with benign segmental biopsies.  Suspect IBS-D. May also have pancreatic insufficiency as she noted diarrhea had also improved with trial of pancreatic enzymes as well. As she is only having 1-2 BMs daily, I do not feel she needs both medications at this time. She prefers to continue Bentyl as this has also helped with abdominal cramping.   Plan: Continue Bentyl 3 times daily before meals and at bedtime. Follow-up after EGD for upper abdominal pain and dysphagia asked discussed below.

## 2020-03-20 NOTE — Progress Notes (Signed)
Cc'ed to pcp °

## 2020-03-20 NOTE — Assessment & Plan Note (Addendum)
Well-controlled on Protonix 40 mg daily. She does report intermittent solid food dysphagia and ongoing epigastric pain that is persistent but worsens with meals and has been unaffected by Protonix (discussed below). Intermittent nausea without vomiting. No melena.   Plan:  Continue Protonix 40 mg daily 30 minutes before breakfast. Counseled on GERD diet/lifestyle. Plan for EGD +/- dilation with propofol with Dr. [name] in the near future.  We are holding off on scheduling procedure until she has been cleared by cardiology as she also notes 9 month history of persistent chest pain that worsens with exertion. ASA III+ Follow-up after EGD.

## 2020-03-20 NOTE — Assessment & Plan Note (Addendum)
Intermittent solid food dysphagia occurring once every couple of weeks.  No food regurgitation. No odynophagia. History of GERD that is now well controlled on Protonix 40 mg daily.   Differentials include esophageal web, ring, or stricture. Less likely malignancy.    Plan: Proceed with EGD +/- dilation with propofol with Dr. [name] in the near future. Will hold off on scheduling until she has been cleared by cardiology as she also notes 9 month history of persistent chest pain that is worsened with exertion. The risks, benefits, and alternatives have been discussed with the patient in detail. The patient states understanding and desires to proceed.  ASA III+ Continue Protonix 40 mg daily. Follow-up after EGD.

## 2020-03-20 NOTE — Patient Instructions (Addendum)
Hopefully we can get you scheduled for an upper endoscopy with possible dilation of your esophagus in the near future with Dr. [name]. We will hold off on scheduling you until you have seen cardiology.  We will request clearance from cardiology as you are having chest pain that worsens with exertion.  Continue taking Protonix 40 mg daily 30 minutes before breakfast.  Continue taking Zofran as needed for nausea.  Follow a GERD diet:  Avoid fried, fatty, greasy, spicy, citrus foods. Avoid caffeine and carbonated beverages. Avoid chocolate. Try eating 4-6 small meals a day rather than 3 large meals. Do not eat within 3 hours of laying down. Prop head of bed up on wood or bricks to create a 6 inch incline.  Continue taking Bentyl 3 times daily before meals and at bedtime.  Hold the setting of constipation.  We will plan to follow-up with you after your upper endoscopy.  If you 1 week after you have seen cardiology, please do not office a call.  Ermalinda Memos, PA-C [facility]

## 2020-03-20 NOTE — Assessment & Plan Note (Addendum)
Patient has 9+ month history of persistent epigastric abdominal pain, worsens with meals, associated nausea without vomiting, and early satiety.  Also history of GERD but has responded well to Protonix 40 mg daily.  Protonix has not affected dyspepsia, nausea, or early satiety.  No melena.  6 pound weight loss since November 2020.  CT A/P with contrast 09/19/2019 with no acute findings.  History of cholecystectomy.  Mild epigastric abdominal tenderness palpation on exam today.  Differentials include gastritis, duodenitis, PUD, gastroparesis, pyloric stenosis. Cannot rule out malignancy.  Plan: Proceed with EGD +/- dilation with propofol with Dr. [name] in the near future. Will hold off on scheduling until she has been cleared by cardiology as she also notes 9 month history of persistent chest pain that is worsened with exertion. The risks, benefits, and alternatives have been discussed with the patient in detail. The patient states understanding and desires to proceed.  ASA III+ Continue Protonix 40 mg daily. Zofran 4 mg every 8 hours as needed for nausea. Follow-up after EGD.  Requested patient to call our office if she has not heard from [place] within 1 week after her upcoming cardiology appointment on 9/2.

## 2020-03-21 ENCOUNTER — Ambulatory Visit: Payer: Medicare Other | Admitting: Gastroenterology

## 2020-03-24 NOTE — Progress Notes (Signed)
Cardiology Office Note   Date:  03/27/2020   ID:  Jamie [name], DOB [birth year], MRN [id number]  PCP:  Encarnacion Slates, PA-C  Cardiologist:   Catia Todorov [place], MD   Chief Complaint  Patient presents with  . Shortness of Breath      History of Present Illness: Jamie [name] is a 49 y.o. female who is seen at the request of Amy Nechama Guard for evaluation of dyspnea on exertion and chest pain. She has a history of DM, HTN, HLD, bipolar disorder, obesity, and OSA on CPAP.   She reports that since November she has noted increased dyspnea. This is at rest and worse with activity. She also notes a constant mid sternal chest pain that radiates to her back. It is there all the time and is worse with movement. She describes this as nagging. Her weight is up and down. Occasional swelling in legs. Reports she was seen by Dr Antony Salmon with pulmonary and pulmonary evaluation including PFTs were OK.     Past Medical History:  Diagnosis Date  . Anxiety   . Arthritis    knees  . Bipolar 1 disorder (HCC)   . Chronic diarrhea   . Diverticulosis of colon   . GERD (gastroesophageal reflux disease)   . Hiatal hernia   . History of concussion    per pt 11/ 2020 w/ no residual  . Hyperlipidemia   . Hypertension    followed by pcp   (11-29-2019  per pt stated never had stress test)  . MDD (major depressive disorder)   . Migraine    neurologist--- dr Terrace Arabia  . OSA on CPAP    followed by dr dohmeier  . PCOS (polycystic ovarian syndrome)   . PONV (postoperative nausea and vomiting)   . Type 2 diabetes mellitus (HCC)    followed by pcp   (11-29-2019  pt stated checks blood sugar daily in am,  fasting sugar--- 96    Past Surgical History:  Procedure Laterality Date  . ANAL FISSURE REPAIR  x3  last one  2007  approx.  [name] Kitchen BIOPSY  09/06/2019   Procedure: BIOPSY;  Surgeon: [name], MD;  Location: [facility];  Service: Endoscopy;;  . COLONOSCOPY WITH PROPOFOL [name] 09/06/2019   pancolonic  diverticulosis. Segmental biopsy completed with benign biopsies.   [name] Kitchen EVALUATION UNDER ANESTHESIA WITH HEMORRHOIDECTOMY [name] 12/05/2019   Procedure: ANORECTAL EXAM UNDER ANESTHESIA WITH HEMORRHOIDECTOMY, HEMORRHOIDAL LIGATION/PEXY;  Surgeon: [name], MD;  Location: [facility];  Service: General;  Laterality: [name];  GENERAL AND LOCAL  . EVALUATION UNDER ANESTHESIA WITH TEAR DUCT PROBING  infant  . KNEE ARTHROSCOPY Right 1994  . LAPAROSCOPIC CHOLECYSTECTOMY  08-06-2002  @[facility]   . LUMBAR LAMINECTOMY/DECOMPRESSION MICRODISCECTOMY Right 01/22/2014   Procedure: LUMBAR FOUR TO FIVE LUMBAR LAMINECTOMY/DECOMPRESSION MICRODISCECTOMY 1 LEVEL;  Surgeon: 01/24/2014, MD;  Location: [facility];  Service: Neurosurgery;  Laterality: Right;  Right L45 diskectomy     Current Outpatient Medications  Medication Sig Dispense Refill  . ACCU-CHEK AVIVA PLUS test strip     . Accu-Chek Softclix Lancets lancets AS DIRECTED TO CHECK BLOOD SUGAR ONCE TO TWICE DAILY    . albuterol (VENTOLIN HFA) 108 (90 Base) MCG/ACT inhaler Inhale 2 puffs into the lungs every 6 (six) hours as needed for wheezing or shortness of breath. 18 g 1  . ARIPiprazole (ABILIFY) 10 MG tablet Take 1 tablet (10 mg total) by mouth daily. 30 tablet  5  . buPROPion (WELLBUTRIN XL) 150 MG 24 hr tablet Take 3 tablets (450 mg total) by mouth daily. 90 tablet 5  . clonazePAM (KLONOPIN) 1 MG tablet Take 1-2 tablets (1-2 mg total) by mouth See admin instructions. Take 1 mg in the morning and 2 mg at night 90 tablet 4  . dicyclomine (BENTYL) 10 MG capsule Take 1 capsule (10 mg total) by mouth 4 (four) times daily -  before meals and at bedtime. 120 capsule 3  . DULoxetine (CYMBALTA) 60 MG capsule Take 2 capsules (120 mg total) by mouth at bedtime. 60 capsule 4  . FIBER PO Take by mouth daily.    . fluticasone (FLONASE) 50 MCG/ACT nasal spray Place 1 spray into both nostrils daily as needed for allergies or rhinitis.     [name] Kitchen gabapentin  (NEURONTIN) 100 MG capsule Take 200 mg by mouth daily.     [name] Kitchen lisinopril (PRINIVIL,ZESTRIL) 5 MG tablet Take 5 mg by mouth daily.    [name] Kitchen loperamide (IMODIUM) 2 MG capsule Take by mouth as needed for diarrhea or loose stools.    . Menthol, Topical Analgesic, (BIOFREEZE EX) Apply 1 application topically daily as needed (back pain).    . metFORMIN (GLUCOPHAGE-XR) 500 MG 24 hr tablet Take 500 mg by mouth daily.     . naproxen sodium (ALEVE) 220 MG tablet Take 220 mg by mouth 2 (two) times daily.    . ondansetron (ZOFRAN-ODT) 4 MG disintegrating tablet TAKE 1 TABLET BY MOUTH EVERY 8 HOURS AS NEEDED FOR NAUSEA AND VOMITING (Patient taking differently: Take 4 mg by mouth as needed. ) 40 tablet 3  . pantoprazole (PROTONIX) 40 MG tablet Take 1 tablet (40 mg total) by mouth daily. Take 30 minutes before breakfast (Patient taking differently: Take 40 mg by mouth daily. Take 30 minutes before breakfast) 90 tablet 3  . rosuvastatin (CRESTOR) 20 MG tablet Take 10 mg by mouth daily.     . Semaglutide,0.25 or 0.5MG /DOS, (OZEMPIC, 0.25 OR 0.5 MG/DOSE,) 2 MG/1.5ML SOPN Inject into the skin once a week.    . topiramate (TOPAMAX) 50 MG tablet Take 1 tablet (50 mg total) by mouth 2 (two) times daily. 180 tablet 3  . traZODone (DESYREL) 100 MG tablet Take 4 tablets (400 mg total) by mouth at bedtime. 120 tablet 4   No current facility-administered medications for this visit.    Allergies:   Codeine and Morphine and related    Social History:  The patient  reports that she has never smoked. She has never used smokeless tobacco. She reports that she does not drink alcohol and does not use drugs.   Family History:  The patient's family history includes Anxiety disorder in her maternal grandmother; COPD in her father; Depression in her maternal grandmother.    ROS:  Please see the history of present illness.   Otherwise, review of systems are positive for none.   All other systems are reviewed and negative.     PHYSICAL EXAM: VS:  BP 118/80   Pulse 100   Ht 5\' 3"  (1.6 m)   Wt 248 lb 11.2 oz (112.8 kg)   SpO2 96%   BMI 44.06 kg/m  , BMI Body mass index is 44.06 kg/m. GEN: Well nourished, obese, in no acute distress  HEENT: normal  Neck: no JVD, carotid bruits, or masses Cardiac: RRR; no murmurs, rubs, or gallops,no edema. + mid sternal pain to palpation.  Respiratory:  clear to auscultation bilaterally, normal work of breathing  GI: soft, nontender, nondistended, + BS MS: no deformity or atrophy  Skin: warm and dry, no rash Neuro:  Strength and sensation are intact Psych: euthymic mood, full affect   EKG:  EKG is ordered today. The ekg ordered today demonstrates NSR rate 100. Normal.    Recent Labs: 12/05/2019: BUN 18; Creatinine, Ser 1.20; Potassium 4.2; Sodium 138 02/22/2020: Hemoglobin 13.5; Platelets 221.0    Lipid Panel    Component Value Date/Time   CHOL 158 05/19/2018 0624   TRIG 448 (H) 05/19/2018 0624   HDL 38 (L) 05/19/2018 0624   CHOLHDL 4.2 05/19/2018 0624   VLDL UNABLE TO CALCULATE IF TRIGLYCERIDE OVER 400 mg/dL 18/84/1660 6301   LDLCALC UNABLE TO CALCULATE IF TRIGLYCERIDE OVER 400 mg/dL 60/04/9322 5573    Dated 01/08/20: A1c 10.4%. cholesterol 88, triglycerides 311, HDL 32  Wt Readings from Last 3 Encounters:  03/27/20 248 lb 11.2 oz (112.8 kg)  03/20/20 247 lb 6.4 oz (112.2 kg)  02/22/20 (!) 246 lb (111.6 kg)      Other studies Reviewed: Additional studies/ records that were reviewed today include: none Review of the above records demonstrates: [name]   ASSESSMENT AND PLAN:  1.  Dyspnea. I suspect much of this is related to her obesity, deconditioning and OSA. She does have multiple cardiac risk factors so we need to assess her cardiac function and make sure this is not an anginal equivalent symptom. Will arrange for an Echocardiogram. She is not a good candidate for coronary CTA due to chronically high HR. Will arrange for lexiscan Myoview.   2. Chest  pain. Atypical. Reproduced on exam. Most consistent with musculoskeletal pain. Cardiac pain would not be constant for several months as she describes.  3. DM type 2 poorly controlled  4. Hyperlipidemia. Combined. On Crestor. Depending on cardiac evaluation may want to target an LDL < 70  5. HTN controlled  6. Obesity with OSA on CPAP  7. Bipolar disorder  8. PCOS.    Current medicines are reviewed at length with the patient today.  The patient does not have concerns regarding medicines.  The following changes have been made:  no change  Labs/ tests ordered today include:   Orders Placed This Encounter  Procedures  . Myocardial Perfusion Imaging  . EKG 12-Lead  . ECHOCARDIOGRAM COMPLETE     Disposition:   FU with me TBD   Signed, Aubry Tucholski [place], MD  03/27/2020 9:04 AM    [facility] [address] [place], [place], [address] Phone [phone], Fax [phone]

## 2020-03-25 DIAGNOSIS — Z7984 Long term (current) use of oral hypoglycemic drugs: Secondary | ICD-10-CM | POA: Diagnosis not present

## 2020-03-25 DIAGNOSIS — I10 Essential (primary) hypertension: Secondary | ICD-10-CM | POA: Diagnosis not present

## 2020-03-25 DIAGNOSIS — E7849 Other hyperlipidemia: Secondary | ICD-10-CM | POA: Diagnosis not present

## 2020-03-25 DIAGNOSIS — E119 Type 2 diabetes mellitus without complications: Secondary | ICD-10-CM | POA: Diagnosis not present

## 2020-03-27 ENCOUNTER — Encounter: Payer: Self-pay | Admitting: Cardiology

## 2020-03-27 ENCOUNTER — Ambulatory Visit: Payer: Medicare Other | Admitting: Cardiology

## 2020-03-27 ENCOUNTER — Other Ambulatory Visit: Payer: Self-pay

## 2020-03-27 VITALS — BP 118/80 | HR 100 | Ht 63.0 in | Wt 248.7 lb

## 2020-03-27 DIAGNOSIS — I10 Essential (primary) hypertension: Secondary | ICD-10-CM

## 2020-03-27 DIAGNOSIS — E119 Type 2 diabetes mellitus without complications: Secondary | ICD-10-CM | POA: Diagnosis not present

## 2020-03-27 DIAGNOSIS — R072 Precordial pain: Secondary | ICD-10-CM

## 2020-03-27 DIAGNOSIS — Z9989 Dependence on other enabling machines and devices: Secondary | ICD-10-CM

## 2020-03-27 DIAGNOSIS — E782 Mixed hyperlipidemia: Secondary | ICD-10-CM | POA: Diagnosis not present

## 2020-03-27 DIAGNOSIS — R0609 Other forms of dyspnea: Secondary | ICD-10-CM

## 2020-03-27 DIAGNOSIS — G4733 Obstructive sleep apnea (adult) (pediatric): Secondary | ICD-10-CM

## 2020-03-27 DIAGNOSIS — R0600 Dyspnea, unspecified: Secondary | ICD-10-CM | POA: Diagnosis not present

## 2020-03-27 NOTE — Patient Instructions (Signed)
Medication Instructions:  Continue same medications     Testing/Procedures: Schedule    Echo  Lexiscan   Follow-Up: At [facility], you and your health needs are our priority.  As part of our continuing mission to provide you with exceptional heart care, we have created designated Provider Care Teams.  These Care Teams include your primary Cardiologist (physician) and Advanced Practice Providers (APPs -  Physician Assistants and Nurse Practitioners) who all work together to provide you with the care you need, when you need it.  We recommend signing up for the patient portal called "MyChart".  Sign up information is provided on this After Visit Summary.  MyChart is used to connect with patients for Virtual Visits (Telemedicine).  Patients are able to view lab/test results, encounter notes, upcoming appointments, etc.  Non-urgent messages can be sent to your provider as well.   To learn more about what you can do with MyChart, go to [email or web address].    Your next appointment:  To be determined after test   The format for your next appointment: Office    Provider:  Dr.Jordan

## 2020-03-28 ENCOUNTER — Telehealth (HOSPITAL_COMMUNITY): Payer: Self-pay

## 2020-03-28 NOTE — Telephone Encounter (Signed)
Encounter complete. 

## 2020-04-01 ENCOUNTER — Ambulatory Visit (HOSPITAL_COMMUNITY)
Admission: RE | Admit: 2020-04-01 | Discharge: 2020-04-01 | Disposition: A | Discharge status: Home / Self Care | Payer: Medicare Other | Source: Ambulatory Visit | Attending: Cardiology | Admitting: Cardiology

## 2020-04-01 ENCOUNTER — Other Ambulatory Visit: Payer: Self-pay

## 2020-04-01 DIAGNOSIS — G4733 Obstructive sleep apnea (adult) (pediatric): Secondary | ICD-10-CM

## 2020-04-01 DIAGNOSIS — Z9989 Dependence on other enabling machines and devices: Secondary | ICD-10-CM | POA: Diagnosis not present

## 2020-04-01 DIAGNOSIS — E782 Mixed hyperlipidemia: Secondary | ICD-10-CM | POA: Insufficient documentation

## 2020-04-01 DIAGNOSIS — R0600 Dyspnea, unspecified: Secondary | ICD-10-CM | POA: Insufficient documentation

## 2020-04-01 DIAGNOSIS — R0609 Other forms of dyspnea: Secondary | ICD-10-CM

## 2020-04-01 DIAGNOSIS — E119 Type 2 diabetes mellitus without complications: Secondary | ICD-10-CM | POA: Insufficient documentation

## 2020-04-01 DIAGNOSIS — R072 Precordial pain: Secondary | ICD-10-CM | POA: Insufficient documentation

## 2020-04-01 DIAGNOSIS — I10 Essential (primary) hypertension: Secondary | ICD-10-CM | POA: Insufficient documentation

## 2020-04-01 LAB — ECHOCARDIOGRAM COMPLETE: Area-P 1/2: 4.49 cm2

## 2020-04-01 NOTE — Progress Notes (Signed)
*  PRELIMINARY RESULTS* Echocardiogram 2D Echocardiogram has been performed.  Jamie [name] 04/01/2020, 4:03 PM

## 2020-04-02 ENCOUNTER — Ambulatory Visit (HOSPITAL_COMMUNITY)
Admission: RE | Admit: 2020-04-02 | Discharge: 2020-04-02 | Disposition: A | Discharge status: Home / Self Care | Payer: Medicare Other | Source: Ambulatory Visit | Attending: Cardiovascular Disease | Admitting: Cardiovascular Disease

## 2020-04-02 ENCOUNTER — Other Ambulatory Visit: Payer: Self-pay

## 2020-04-02 DIAGNOSIS — I10 Essential (primary) hypertension: Secondary | ICD-10-CM | POA: Diagnosis not present

## 2020-04-02 DIAGNOSIS — E119 Type 2 diabetes mellitus without complications: Secondary | ICD-10-CM | POA: Diagnosis not present

## 2020-04-02 DIAGNOSIS — R072 Precordial pain: Secondary | ICD-10-CM | POA: Diagnosis not present

## 2020-04-02 DIAGNOSIS — Z9989 Dependence on other enabling machines and devices: Secondary | ICD-10-CM

## 2020-04-02 DIAGNOSIS — E782 Mixed hyperlipidemia: Secondary | ICD-10-CM | POA: Diagnosis not present

## 2020-04-02 DIAGNOSIS — R0600 Dyspnea, unspecified: Secondary | ICD-10-CM | POA: Insufficient documentation

## 2020-04-02 DIAGNOSIS — G4733 Obstructive sleep apnea (adult) (pediatric): Secondary | ICD-10-CM | POA: Insufficient documentation

## 2020-04-02 MED ORDER — REGADENOSON 0.4 MG/5ML IV SOLN
0.4000 mg | Freq: Once | INTRAVENOUS | Status: AC
  Administered 2020-04-02: 0.4 mg via INTRAVENOUS

## 2020-04-02 MED ORDER — TECHNETIUM TC 99M TETROFOSMIN IV KIT
31.9000 | PACK | Freq: Once | INTRAVENOUS | Status: AC | PRN
  Administered 2020-04-02: 31.9 via INTRAVENOUS
  Filled 2020-04-02: qty 32

## 2020-04-03 ENCOUNTER — Ambulatory Visit (HOSPITAL_COMMUNITY)
Admission: RE | Admit: 2020-04-03 | Discharge: 2020-04-03 | Disposition: A | Discharge status: Home / Self Care | Payer: Medicare Other | Source: Ambulatory Visit | Attending: Cardiovascular Disease | Admitting: Cardiovascular Disease

## 2020-04-03 LAB — MYOCARDIAL PERFUSION IMAGING
LV dias vol: 70 mL (ref 46–106)
LV sys vol: 29 mL
Peak HR: 109 {beats}/min
Rest HR: 104 {beats}/min
SDS: 8
SRS: 0
SSS: 8
TID: 1.06

## 2020-04-03 MED ORDER — TECHNETIUM TC 99M TETROFOSMIN IV KIT
29.2000 | PACK | Freq: Once | INTRAVENOUS | Status: AC | PRN
  Administered 2020-04-03: 29.2 via INTRAVENOUS

## 2020-04-04 ENCOUNTER — Other Ambulatory Visit: Payer: Self-pay | Admitting: Primary Care

## 2020-04-14 ENCOUNTER — Telehealth: Payer: Self-pay | Admitting: *Deleted

## 2020-04-14 NOTE — Telephone Encounter (Signed)
Reviewed recent visit with cardiology and additional work-up.  She has completed echocardiogram and Lexiscan which have been unrevealing.  Cardiology suspects her symptoms are secondary to obesity, deconditioning, and sleep apnea with recommendations focus on weight loss and regular aerobic exercise.  They are planning to follow-up as needed.  Okay to proceed with procedures with propofol.

## 2020-04-14 NOTE — Telephone Encounter (Signed)
Called pt. She has been scheduled for EGD +/-dil with propofol on 11/22 at 3:00pm. Aware will mail pre-op/covid test appt. Confirmed mailing address.   PA for EGD/DIL was approved via [facility] website. Authorization #: [id number]  dates 06/16/20-07/25/20

## 2020-04-14 NOTE — Telephone Encounter (Signed)
Kristen, please advise if okay to proceed with scheduled EGD +/- DIL with propofol with Dr. [name]? Thanks

## 2020-04-15 ENCOUNTER — Encounter: Payer: Self-pay | Admitting: *Deleted

## 2020-04-22 ENCOUNTER — Telehealth: Payer: Self-pay | Admitting: Internal Medicine

## 2020-04-22 ENCOUNTER — Ambulatory Visit: Payer: Medicare Other | Admitting: Neurology

## 2020-04-22 NOTE — Telephone Encounter (Signed)
PATIENT CALLED WANTING TO KNOW IF HER UPCOMING EGD WILL SHOW A HIATAL HERNIA

## 2020-04-22 NOTE — Telephone Encounter (Signed)
Dr. [name] will be able to see if she has a hiatal hernia.   Regarding Protonix, she can try increasing this to twice daily 30 minutes before breakfast and dinner.  If this is unhelpful, we can try different PPI. Does she have enough Protonix to go ahead and try increasing to BID? If so, she can try this and call with a progress report in 1 week. If it works well, I will send in an updated Rx.

## 2020-04-22 NOTE — Telephone Encounter (Signed)
Spoke with pt. Pt is having some pain where her sternum is and feels it could be a hernia. Pt asked when she has her EGD, can a hernia be seen on that report. Pt wants to know is there anything else she can do until her EGD. Pt is taking Pantoprazole 40 mg daily and feels it doesn't work as well as it use to.

## 2020-04-22 NOTE — Telephone Encounter (Signed)
Lmom, waiting on a return call.  

## 2020-04-22 NOTE — Telephone Encounter (Signed)
Patient returned call, please call back  

## 2020-04-23 ENCOUNTER — Other Ambulatory Visit: Payer: Self-pay | Admitting: Gastroenterology

## 2020-04-23 DIAGNOSIS — K219 Gastro-esophageal reflux disease without esophagitis: Secondary | ICD-10-CM

## 2020-04-23 MED ORDER — PANTOPRAZOLE SODIUM 40 MG PO TBEC: 40.0000 mg | DELAYED_RELEASE_TABLET | Freq: Two times a day (BID) | ORAL | 3 refills | Status: DC

## 2020-04-23 NOTE — Telephone Encounter (Signed)
Noted Rx sent to pharmacy  

## 2020-04-23 NOTE — Telephone Encounter (Signed)
Spoke with pt. Pt is aware that the hiatal hernia will be able to be seen when procedure is completed. Pt would like to increase her PPI and says she will need a nex RX sent to her pharmacy.

## 2020-04-23 NOTE — Telephone Encounter (Signed)
Noted. Pt is aware that RX was sent. 

## 2020-05-09 DIAGNOSIS — R0789 Other chest pain: Secondary | ICD-10-CM | POA: Diagnosis not present

## 2020-05-09 DIAGNOSIS — E119 Type 2 diabetes mellitus without complications: Secondary | ICD-10-CM | POA: Diagnosis not present

## 2020-05-09 DIAGNOSIS — S81012A Laceration without foreign body, left knee, initial encounter: Secondary | ICD-10-CM | POA: Diagnosis not present

## 2020-05-09 DIAGNOSIS — S199XXA Unspecified injury of neck, initial encounter: Secondary | ICD-10-CM | POA: Diagnosis not present

## 2020-05-09 DIAGNOSIS — S299XXA Unspecified injury of thorax, initial encounter: Secondary | ICD-10-CM | POA: Diagnosis not present

## 2020-05-09 DIAGNOSIS — M25562 Pain in left knee: Secondary | ICD-10-CM | POA: Diagnosis not present

## 2020-05-09 DIAGNOSIS — I10 Essential (primary) hypertension: Secondary | ICD-10-CM | POA: Diagnosis not present

## 2020-05-09 DIAGNOSIS — R072 Precordial pain: Secondary | ICD-10-CM | POA: Diagnosis not present

## 2020-05-09 DIAGNOSIS — M542 Cervicalgia: Secondary | ICD-10-CM | POA: Diagnosis not present

## 2020-05-09 DIAGNOSIS — R55 Syncope and collapse: Secondary | ICD-10-CM | POA: Diagnosis not present

## 2020-05-13 DIAGNOSIS — R300 Dysuria: Secondary | ICD-10-CM | POA: Diagnosis not present

## 2020-05-14 ENCOUNTER — Other Ambulatory Visit (HOSPITAL_COMMUNITY): Payer: Self-pay | Admitting: Psychiatry

## 2020-05-27 DIAGNOSIS — M545 Low back pain, unspecified: Secondary | ICD-10-CM | POA: Diagnosis not present

## 2020-05-27 DIAGNOSIS — Z1389 Encounter for screening for other disorder: Secondary | ICD-10-CM | POA: Diagnosis not present

## 2020-05-27 DIAGNOSIS — I10 Essential (primary) hypertension: Secondary | ICD-10-CM | POA: Diagnosis not present

## 2020-05-27 DIAGNOSIS — K219 Gastro-esophageal reflux disease without esophagitis: Secondary | ICD-10-CM | POA: Diagnosis not present

## 2020-05-27 DIAGNOSIS — E119 Type 2 diabetes mellitus without complications: Secondary | ICD-10-CM | POA: Diagnosis not present

## 2020-05-27 DIAGNOSIS — Z23 Encounter for immunization: Secondary | ICD-10-CM | POA: Diagnosis not present

## 2020-05-27 DIAGNOSIS — E782 Mixed hyperlipidemia: Secondary | ICD-10-CM | POA: Diagnosis not present

## 2020-05-28 DIAGNOSIS — T148XXA Other injury of unspecified body region, initial encounter: Secondary | ICD-10-CM | POA: Diagnosis not present

## 2020-06-04 ENCOUNTER — Other Ambulatory Visit: Payer: Self-pay

## 2020-06-04 ENCOUNTER — Telehealth (INDEPENDENT_AMBULATORY_CARE_PROVIDER_SITE_OTHER): Payer: Medicare Other | Admitting: Psychiatry

## 2020-06-04 DIAGNOSIS — F323 Major depressive disorder, single episode, severe with psychotic features: Secondary | ICD-10-CM | POA: Diagnosis not present

## 2020-06-04 DIAGNOSIS — F339 Major depressive disorder, recurrent, unspecified: Secondary | ICD-10-CM | POA: Diagnosis not present

## 2020-06-04 MED ORDER — ARIPIPRAZOLE 10 MG PO TABS
10.0000 mg | ORAL_TABLET | Freq: Every day | ORAL | 5 refills | Status: DC
Start: 2020-06-04 — End: 2020-08-27

## 2020-06-04 MED ORDER — DULOXETINE HCL 60 MG PO CPEP: 120.0000 mg | ORAL_CAPSULE | Freq: Every day | ORAL | 4 refills | Status: DC

## 2020-06-04 MED ORDER — CLONAZEPAM 1 MG PO TABS: 1.0000 mg | ORAL_TABLET | ORAL | 4 refills | Status: DC

## 2020-06-04 MED ORDER — TRAZODONE HCL 100 MG PO TABS
400.0000 mg | ORAL_TABLET | Freq: Every day | ORAL | 4 refills | Status: DC
Start: 2020-06-04 — End: 2020-08-27

## 2020-06-04 MED ORDER — BUPROPION HCL ER (XL) 150 MG PO TB24
450.0000 mg | ORAL_TABLET | Freq: Every day | ORAL | 5 refills | Status: DC
Start: 2020-06-04 — End: 2020-08-27

## 2020-06-04 NOTE — Progress Notes (Signed)
Patient ID: Jamie [name], female   DOB: 1971-03-12, 49 y.o.   MRN: [id number] [facility] MD Progress Note  06/04/2020 4:19 PM DEIONNA MARCANTONIO  MRN:  [id number] Subjective:  Feeling well. Principal Problem: Major depression recurrent  Today the patient is doing fairly well.  She is good to be with family for Thanksgiving.  She knows it will be hard because she has to grieve family members.  The patient is got a good husband but they still have 3 or 4 people living in the household which is too much.  The good news is they discussed some new puppies.  They are training 3 Pit Bulls.  Patient's mood is fairly stable.  She is sleeping and eating well.  Her headaches are better.  The patient denies being suicidal.  She knows about the options for a walk-in clinic if that happens again.  She takes a number of medications and is very stable on medicines.  The patient is not psychotic.  She drinks no alcohol uses no drugs.  Past Medical History:  Diagnosis Date  . Anxiety   . Arthritis    knees  . Bipolar 1 disorder (HCC)   . Chronic diarrhea   . Diverticulosis of colon   . GERD (gastroesophageal reflux disease)   . Hiatal hernia   . History of concussion    per pt 11/ 2020 w/ no residual  . Hyperlipidemia   . Hypertension    followed by pcp   (11-29-2019  per pt stated never had stress test)  . MDD (major depressive disorder)   . Migraine    neurologist--- dr Terrace Arabia  . OSA on CPAP    followed by dr dohmeier  . PCOS (polycystic ovarian syndrome)   . PONV (postoperative nausea and vomiting)   . Type 2 diabetes mellitus (HCC)    followed by pcp   (11-29-2019  pt stated checks blood sugar daily in am,  fasting sugar--- 96    Past Surgical History:  Procedure Laterality Date  . ANAL FISSURE REPAIR  x3  last one  2007  approx.  [name] Kitchen BIOPSY  09/06/2019   Procedure: BIOPSY;  Surgeon: [name], MD;  [facility]: [facility];  Service: Endoscopy;;  . COLONOSCOPY WITH PROPOFOL [name] 09/06/2019    pancolonic diverticulosis. Segmental biopsy completed with benign biopsies.   [name] Kitchen EVALUATION UNDER ANESTHESIA WITH HEMORRHOIDECTOMY [name] 12/05/2019   Procedure: ANORECTAL EXAM UNDER ANESTHESIA WITH HEMORRHOIDECTOMY, HEMORRHOIDAL LIGATION/PEXY;  Surgeon: [name], MD;  Location: [facility];  Service: General;  Laterality: [name];  GENERAL AND LOCAL  . EVALUATION UNDER ANESTHESIA WITH TEAR DUCT PROBING  infant  . KNEE ARTHROSCOPY Right 1994  . LAPAROSCOPIC CHOLECYSTECTOMY  08-06-2002  @[facility]   . LUMBAR LAMINECTOMY/DECOMPRESSION MICRODISCECTOMY Right 01/22/2014   Procedure: LUMBAR FOUR TO FIVE LUMBAR LAMINECTOMY/DECOMPRESSION MICRODISCECTOMY 1 LEVEL;  Surgeon: 01/24/2014, MD;  Location: [facility];  Service: Neurosurgery;  Laterality: Right;  Right L45 diskectomy   Family History:  Family History  Problem Relation Age of Onset  . COPD Father   . Anxiety disorder Maternal Grandmother   . Depression Maternal Grandmother   . Colon cancer Neg Hx   . Colon polyps Neg Hx    Family Psychiatric  History:  Social History:  Social History   Substance and Sexual Activity  Alcohol Use No     Social History   Substance and Sexual Activity  Drug Use Never    Social History   Socioeconomic  History  . Marital status: Married    Spouse name: Not on file  . Number of children: 0  . Years of education: college  . Highest education level: Not on file  Occupational History  . Occupation: disabled  Tobacco Use  . Smoking status: Never Smoker  . Smokeless tobacco: Never Used  Vaping Use  . Vaping Use: Never used  Substance and Sexual Activity  . Alcohol use: No  . Drug use: Never  . Sexual activity: Yes    Partners: Male    Birth control/protection: None  Other Topics Concern  . Not on file  Social History Narrative   Patient lives at home alone    Patient is right handed   Patient drinks soda's daily   Social Determinants of Health   Financial Resource Strain:   .  Difficulty of Paying Living Expenses: Not on file  Food Insecurity:   . Worried About Programme researcher, broadcasting/film/video in the Last Year: Not on file  . Ran Out of Food in the Last Year: Not on file  Transportation Needs:   . Lack of Transportation (Medical): Not on file  . Lack of Transportation (Non-Medical): Not on file  Physical Activity:   . Days of Exercise per Week: Not on file  . Minutes of Exercise per Session: Not on file  Stress:   . Feeling of Stress : Not on file  Social Connections:   . Frequency of Communication with Friends and Family: Not on file  . Frequency of Social Gatherings with Friends and Family: Not on file  . Attends Religious Services: Not on file  . Active Member of Clubs or Organizations: Not on file  . Attends Banker Meetings: Not on file  . Marital Status: Not on file   Additional Social History:                         Sleep: Fair  Appetite:  Good  Current Medications: Current Outpatient Medications  Medication Sig Dispense Refill  . ACCU-CHEK AVIVA PLUS test strip     . Accu-Chek Softclix Lancets lancets AS DIRECTED TO CHECK BLOOD SUGAR ONCE TO TWICE DAILY    . albuterol (VENTOLIN HFA) 108 (90 Base) MCG/ACT inhaler INHALE TWO PUFFS BY MOUTH EVERY 6 HOURS AS NEEDED FOR WHEEZING OR SHORTNESS OF BREATH 8.5 g 1  . ARIPiprazole (ABILIFY) 10 MG tablet Take 1 tablet (10 mg total) by mouth daily. 30 tablet 5  . buPROPion (WELLBUTRIN XL) 150 MG 24 hr tablet Take 3 tablets (450 mg total) by mouth daily. 90 tablet 5  . clonazePAM (KLONOPIN) 1 MG tablet Take 1-2 tablets (1-2 mg total) by mouth See admin instructions. Take 1 mg in the morning and 2 mg at night 90 tablet 4  . dicyclomine (BENTYL) 10 MG capsule Take 1 capsule (10 mg total) by mouth 4 (four) times daily -  before meals and at bedtime. 120 capsule 3  . DULoxetine (CYMBALTA) 60 MG capsule Take 2 capsules (120 mg total) by mouth at bedtime. 60 capsule 4  . FIBER PO Take by mouth daily.     . fluticasone (FLONASE) 50 MCG/ACT nasal spray Place 1 spray into both nostrils daily as needed for allergies or rhinitis.     [name] Kitchen gabapentin (NEURONTIN) 100 MG capsule Take 200 mg by mouth daily.     [name] Kitchen lisinopril (PRINIVIL,ZESTRIL) 5 MG tablet Take 5 mg by mouth daily.    [name] Kitchen  loperamide (IMODIUM) 2 MG capsule Take by mouth as needed for diarrhea or loose stools.    . Menthol, Topical Analgesic, (BIOFREEZE EX) Apply 1 application topically daily as needed (back pain).    . metFORMIN (GLUCOPHAGE-XR) 500 MG 24 hr tablet Take 500 mg by mouth daily.     . naproxen sodium (ALEVE) 220 MG tablet Take 220 mg by mouth 2 (two) times daily.    . ondansetron (ZOFRAN-ODT) 4 MG disintegrating tablet TAKE 1 TABLET BY MOUTH EVERY 8 HOURS AS NEEDED FOR NAUSEA AND VOMITING (Patient taking differently: Take 4 mg by mouth as needed. ) 40 tablet 3  . pantoprazole (PROTONIX) 40 MG tablet Take 1 tablet (40 mg total) by mouth 2 (two) times daily before a meal. Take 30 minutes before breakfast 60 tablet 3  . rosuvastatin (CRESTOR) 20 MG tablet Take 10 mg by mouth daily.     . Semaglutide,0.25 or 0.5MG /DOS, (OZEMPIC, 0.25 OR 0.5 MG/DOSE,) 2 MG/1.5ML SOPN Inject into the skin once a week.    . topiramate (TOPAMAX) 50 MG tablet Take 1 tablet (50 mg total) by mouth 2 (two) times daily. 180 tablet 3  . traZODone (DESYREL) 100 MG tablet Take 4 tablets (400 mg total) by mouth at bedtime. 120 tablet 4   No current facility-administered medications for this visit.    Lab Results: No results found for this or any previous visit (from the past 48 hour(s)).  Physical Findings: AIMS:  , ,  ,  ,    CIWA:    COWS:     Musculoskeletal: Strength & Muscle Tone: within normal limits Gait & Station: normal Patient leans: Right  Psychiatric Specialty Exam: ROS  There were no vitals taken for this visit.There is no height or weight on file to calculate BMI.  General Appearance: Casual  Eye Contact::  Good  Speech:  Clear and  Coherent  Volume:  Normal  Mood:Depressed  Affect:  Appropriate  Today the patient is doing very well. Thought Process:  Coherent  Orientation:  Full (Time, Place, and Person)  Thought Content:  NA and WDL  Suicidal Thoughts:  yes  Homicidal Thoughts:  No  Memory:  Immediate;   NA  Judgement:  Good  Insight:  Good  Psychomotor Activity:  NA  Concentration:  Good  Recall:  Good  Fund of Knowledge:Good  Language: Good  Akathisia:  No  Handed:  Right  AIMS (if indicated):     Assets:  Desire for Improvement  ADL's:  Intact  Cognition: WNL  Sleep:      Treatment Plan Summary: 06/04/2020, 4:19 PM  Today the patient is doing fairly well.  She is diagnosed with major depression.  The patient will continue taking Cymbalta 60 mg 2 a day.  She will continue taking Abilify 10 mg.  She also continue on Wellbutrin 150 mg 3 a day.  Her second problem is that of insomnia.  The patient will continue taking 100 mg of trazodone taking 4 of them at night.  Her third problem is that of an adjustment disorder with an anxious mood state.  This is mainly related to a very chaotic drug-induced boundary setting.  She is having with a lot of grief.  She will continue taking Klonopin 1 mg 1 in the morning and 2 at night.  This patient will continue with therapy and she will return to see me in 2-1/2 months.

## 2020-06-10 NOTE — Patient Instructions (Signed)
CYANA SHOOK  06/10/2020     @PREFPERIOPPHARMACY @   Your procedure is scheduled on  06/16/2020.  Report to [facility] at  1330 (1:30)  P.M.  Call this number if you have problems the morning of surgery:  [phone]   Remember:  Follow the diet instructions given to you by the office.                       Take these medicines the morning of surgery with A SIP OF WATER  Abilify, wellbutrin, clonazepam, gabapentin, zofran(if needed), protonix, topamax. Use your inhaler before yo come and bring your rescue inhaler with you. DO NOT take any medications for diabetes the morning of your procdure.    Do not wear jewelry, make-up or nail polish.  Do not wear lotions, powders, or perfumes. Please wear deodorant and brush your teeth.  Do not shave 48 hours prior to surgery.  Men may shave face and neck.  Do not bring valuables to the hospital.  [facility] is not responsible for any belongings or valuables.  Contacts, dentures or bridgework may not be worn into surgery.  Leave your suitcase in the car.  After surgery it may be brought to your room.  For patients admitted to the hospital, discharge time will be determined by your treatment team.  Patients discharged the day of surgery will not be allowed to drive home.   Name and phone number of your driver:   family Special instructions:  DO NOT smoke the morning of your procedure.  Please read over the following fact sheets that you were given. Anesthesia Post-op Instructions and Care and Recovery After Surgery       Upper Endoscopy, Adult, Care After This sheet gives you information about how to care for yourself after your procedure. Your health care provider may also give you more specific instructions. If you have problems or questions, contact your health care provider. What can I expect after the procedure? After the procedure, it is common to have:  A sore throat.  Mild stomach pain or  discomfort.  Bloating.  Nausea. Follow these instructions at home:   Follow instructions from your health care provider about what to eat or drink after your procedure.  Return to your normal activities as told by your health care provider. Ask your health care provider what activities are safe for you.  Take over-the-counter and prescription medicines only as told by your health care provider.  Do not drive for 24 hours if you were given a sedative during your procedure.  Keep all follow-up visits as told by your health care provider. This is important. Contact a health care provider if you have:  A sore throat that lasts longer than one day.  Trouble swallowing. Get help right away if:  You vomit blood or your vomit looks like coffee grounds.  You have: ? A fever. ? Bloody, black, or tarry stools. ? A severe sore throat or you cannot swallow. ? Difficulty breathing. ? Severe pain in your chest or abdomen. Summary  After the procedure, it is common to have a sore throat, mild stomach discomfort, bloating, and nausea.  Do not drive for 24 hours if you were given a sedative during the procedure.  Follow instructions from your health care provider about what to eat or drink after your procedure.  Return to your normal activities as told by your health care provider.  This information is not intended to replace advice given to you by your health care provider. Make sure you discuss any questions you have with your health care provider. Document Revised: 01/03/2018 Document Reviewed: 12/12/2017 Elsevier Patient Education  [place].  Esophageal Dilatation Esophageal dilatation, also called esophageal dilation, is a procedure to widen or open (dilate) a blocked or narrowed part of the esophagus. The esophagus is the part of the body that moves food and liquid from the mouth to the stomach. You may need this procedure if:  You have a buildup of scar tissue in your  esophagus that makes it difficult, painful, or impossible to swallow. This can be caused by gastroesophageal reflux disease (GERD).  You have cancer of the esophagus.  There is a problem with how food moves through your esophagus. In some cases, you may need this procedure repeated at a later time to dilate the esophagus gradually. Tell a health care provider about:  Any allergies you have.  All medicines you are taking, including vitamins, herbs, eye drops, creams, and over-the-counter medicines.  Any problems you or family members have had with anesthetic medicines.  Any blood disorders you have.  Any surgeries you have had.  Any medical conditions you have.  Any antibiotic medicines you are required to take before dental procedures.  Whether you are pregnant or may be pregnant. What are the risks? Generally, this is a safe procedure. However, problems may occur, including:  Bleeding due to a tear in the lining of the esophagus.  A hole (perforation) in the esophagus. What happens before the procedure?  Follow instructions from your health care provider about eating or drinking restrictions.  Ask your health care provider about changing or stopping your regular medicines. This is especially important if you are taking diabetes medicines or blood thinners.  Plan to have someone take you home from the hospital or clinic.  Plan to have a responsible adult care for you for at least 24 hours after you leave the hospital or clinic. This is important. What happens during the procedure?  You may be given a medicine to help you relax (sedative).  A numbing medicine may be sprayed into the back of your throat, or you may gargle the medicine.  Your health care provider may perform the dilatation using various surgical instruments, such as: ? Simple dilators. This instrument is carefully placed in the esophagus to stretch it. ? Guided wire bougies. This involves using an endoscope  to insert a wire into the esophagus. A dilator is passed over this wire to enlarge the esophagus. Then the wire is removed. ? Balloon dilators. An endoscope with a small balloon at the end is inserted into the esophagus. The balloon is inflated to stretch the esophagus and open it up. The procedure may vary among health care providers and hospitals. What happens after the procedure?  Your blood pressure, heart rate, breathing rate, and blood oxygen level will be monitored until the medicines you were given have worn off.  Your throat may feel slightly sore and numb. This will improve slowly over time.  You will not be allowed to eat or drink until your throat is no longer numb.  When you are able to drink, urinate, and sit on the edge of the bed without nausea or dizziness, you may be able to return home. Follow these instructions at home:  Take over-the-counter and prescription medicines only as told by your health care provider.  Do not drive  for 24 hours if you were given a sedative during your procedure.  You should have a responsible adult with you for 24 hours after the procedure.  Follow instructions from your health care provider about any eating or drinking restrictions.  Do not use any products that contain nicotine or tobacco, such as cigarettes and e-cigarettes. If you need help quitting, ask your health care provider.  Keep all follow-up visits as told by your health care provider. This is important. Get help right away if you:  Have a fever.  Have chest pain.  Have pain that is not relieved by medication.  Have trouble breathing.  Have trouble swallowing.  Vomit blood. Summary  Esophageal dilatation, also called esophageal dilation, is a procedure to widen or open (dilate) a blocked or narrowed part of the esophagus.  Plan to have someone take you home from the hospital or clinic.  For this procedure, a numbing medicine may be sprayed into the back of your  throat, or you may gargle the medicine.  Do not drive for 24 hours if you were given a sedative during your procedure. This information is not intended to replace advice given to you by your health care provider. Make sure you discuss any questions you have with your health care provider. Document Revised: 05/09/2019 Document Reviewed: 05/17/2017 Elsevier Patient Education  2020 [place] After These instructions provide you with information about caring for yourself after your procedure. Your health care provider may also give you more specific instructions. Your treatment has been planned according to current medical practices, but problems sometimes occur. Call your health care provider if you have any problems or questions after your procedure. What can I expect after the procedure? After your procedure, you may:  Feel sleepy for several hours.  Feel clumsy and have poor balance for several hours.  Feel forgetful about what happened after the procedure.  Have poor judgment for several hours.  Feel nauseous or vomit.  Have a sore throat if you had a breathing tube during the procedure. Follow these instructions at home: For at least 24 hours after the procedure:      Have a responsible adult stay with you. It is important to have someone help care for you until you are awake and alert.  Rest as needed.  Do not: ? Participate in activities in which you could fall or become injured. ? Drive. ? Use heavy machinery. ? Drink alcohol. ? Take sleeping pills or medicines that cause drowsiness. ? Make important decisions or sign legal documents. ? Take care of children on your own. Eating and drinking  Follow the diet that is recommended by your health care provider.  If you vomit, drink water, juice, or soup when you can drink without vomiting.  Make sure you have little or no nausea before eating solid foods. General instructions  Take  over-the-counter and prescription medicines only as told by your health care provider.  If you have sleep apnea, surgery and certain medicines can increase your risk for breathing problems. Follow instructions from your health care provider about wearing your sleep device: ? Anytime you are sleeping, including during daytime naps. ? While taking prescription pain medicines, sleeping medicines, or medicines that make you drowsy.  If you smoke, do not smoke without supervision.  Keep all follow-up visits as told by your health care provider. This is important. Contact a health care provider if:  You keep feeling nauseous or you keep vomiting.  You feel light-headed.  You develop a rash.  You have a fever. Get help right away if:  You have trouble breathing. Summary  For several hours after your procedure, you may feel sleepy and have poor judgment.  Have a responsible adult stay with you for at least 24 hours or until you are awake and alert. This information is not intended to replace advice given to you by your health care provider. Make sure you discuss any questions you have with your health care provider. Document Revised: 10/10/2017 Document Reviewed: 11/02/2015 Elsevier Patient Education  [place].

## 2020-06-12 ENCOUNTER — Other Ambulatory Visit (HOSPITAL_COMMUNITY)
Admission: RE | Admit: 2020-06-12 | Discharge: 2020-06-12 | Disposition: A | Discharge status: Home / Self Care | Payer: Medicare Other | Source: Ambulatory Visit | Attending: Internal Medicine | Admitting: Internal Medicine

## 2020-06-12 ENCOUNTER — Other Ambulatory Visit: Payer: Self-pay

## 2020-06-12 ENCOUNTER — Encounter (HOSPITAL_COMMUNITY)
Admission: RE | Admit: 2020-06-12 | Discharge: 2020-06-12 | Disposition: A | Discharge status: Home / Self Care | Payer: Medicare Other | Source: Ambulatory Visit | Attending: Internal Medicine | Admitting: Internal Medicine

## 2020-06-12 ENCOUNTER — Encounter (HOSPITAL_COMMUNITY): Payer: Self-pay

## 2020-06-12 DIAGNOSIS — Z01812 Encounter for preprocedural laboratory examination: Secondary | ICD-10-CM | POA: Insufficient documentation

## 2020-06-12 DIAGNOSIS — Z20822 Contact with and (suspected) exposure to covid-19: Secondary | ICD-10-CM | POA: Diagnosis not present

## 2020-06-12 LAB — BASIC METABOLIC PANEL
Anion gap: 10 (ref 5–15)
BUN: 17 mg/dL (ref 6–20)
CO2: 24 mmol/L (ref 22–32)
Calcium: 9.3 mg/dL (ref 8.9–10.3)
Chloride: 102 mmol/L (ref 98–111)
Creatinine, Ser: 0.74 mg/dL (ref 0.44–1.00)
GFR, Estimated: >60 mL/min (ref >60)
Glucose, Bld: 196 mg/dL — ABNORMAL HIGH (ref 70–99)
Potassium: 3.9 mmol/L (ref 3.5–5.1)
Sodium: 136 mmol/L (ref 135–145)

## 2020-06-12 LAB — HCG, SERUM, QUALITATIVE: Preg, Serum: NEGATIVE

## 2020-06-13 LAB — SARS CORONAVIRUS 2 (TAT 6-24 HRS): SARS Coronavirus 2: NEGATIVE

## 2020-06-16 ENCOUNTER — Ambulatory Visit (HOSPITAL_COMMUNITY)
Admission: RE | Admit: 2020-06-16 | Discharge: 2020-06-16 | Disposition: A | Discharge status: Home / Self Care | Payer: Medicare Other | Attending: Internal Medicine | Admitting: Internal Medicine

## 2020-06-16 ENCOUNTER — Ambulatory Visit (HOSPITAL_COMMUNITY): Payer: Medicare Other | Admitting: Anesthesiology

## 2020-06-16 ENCOUNTER — Other Ambulatory Visit: Payer: Self-pay

## 2020-06-16 ENCOUNTER — Encounter (HOSPITAL_COMMUNITY)
Admission: RE | Disposition: A | Discharge status: Home / Self Care | Payer: Self-pay | Source: Home / Self Care | Attending: Internal Medicine

## 2020-06-16 ENCOUNTER — Encounter (HOSPITAL_COMMUNITY): Payer: Self-pay | Admitting: Internal Medicine

## 2020-06-16 DIAGNOSIS — R1310 Dysphagia, unspecified: Secondary | ICD-10-CM

## 2020-06-16 DIAGNOSIS — Z79899 Other long term (current) drug therapy: Secondary | ICD-10-CM | POA: Insufficient documentation

## 2020-06-16 DIAGNOSIS — R1314 Dysphagia, pharyngoesophageal phase: Secondary | ICD-10-CM | POA: Diagnosis not present

## 2020-06-16 DIAGNOSIS — Z8719 Personal history of other diseases of the digestive system: Secondary | ICD-10-CM | POA: Diagnosis not present

## 2020-06-16 DIAGNOSIS — Z885 Allergy status to narcotic agent status: Secondary | ICD-10-CM | POA: Diagnosis not present

## 2020-06-16 DIAGNOSIS — K219 Gastro-esophageal reflux disease without esophagitis: Secondary | ICD-10-CM | POA: Insufficient documentation

## 2020-06-16 DIAGNOSIS — Z7984 Long term (current) use of oral hypoglycemic drugs: Secondary | ICD-10-CM | POA: Insufficient documentation

## 2020-06-16 DIAGNOSIS — E119 Type 2 diabetes mellitus without complications: Secondary | ICD-10-CM | POA: Diagnosis not present

## 2020-06-16 DIAGNOSIS — G4730 Sleep apnea, unspecified: Secondary | ICD-10-CM | POA: Diagnosis not present

## 2020-06-16 DIAGNOSIS — I10 Essential (primary) hypertension: Secondary | ICD-10-CM | POA: Diagnosis not present

## 2020-06-16 HISTORY — PX: MALONEY DILATION: SHX5535

## 2020-06-16 HISTORY — PX: ESOPHAGOGASTRODUODENOSCOPY (EGD) WITH PROPOFOL: SHX5813

## 2020-06-16 LAB — GLUCOSE, CAPILLARY: Glucose-Capillary: 162 mg/dL — ABNORMAL HIGH (ref 70–99)

## 2020-06-16 SURGERY — ESOPHAGOGASTRODUODENOSCOPY (EGD) WITH PROPOFOL
Anesthesia: General

## 2020-06-16 MED ORDER — PROPOFOL 10 MG/ML IV BOLUS
INTRAVENOUS | Status: DC | PRN
  Administered 2020-06-16: 175 ug/kg/min via INTRAVENOUS
  Administered 2020-06-16: 100 mg via INTRAVENOUS

## 2020-06-16 MED ORDER — LACTATED RINGERS IV SOLN: Freq: Once | INTRAVENOUS | Status: AC

## 2020-06-16 MED ORDER — GLYCOPYRROLATE 0.2 MG/ML IJ SOLN
0.2000 mg | Freq: Once | INTRAMUSCULAR | Status: AC
  Administered 2020-06-16: 0.2 mg via INTRAVENOUS

## 2020-06-16 MED ORDER — STERILE WATER FOR IRRIGATION IR SOLN
Status: DC | PRN
  Administered 2020-06-16: 1.5 mL

## 2020-06-16 MED ORDER — LACTATED RINGERS IV SOLN: INTRAVENOUS | Status: DC | PRN

## 2020-06-16 MED ORDER — LIDOCAINE VISCOUS HCL 2 % MT SOLN
15.0000 mL | Freq: Once | OROMUCOSAL | Status: AC
  Administered 2020-06-16: 15 mL via OROMUCOSAL

## 2020-06-16 MED ORDER — CHLORHEXIDINE GLUCONATE CLOTH 2 % EX PADS: 6.0000 | MEDICATED_PAD | Freq: Once | CUTANEOUS | Status: DC

## 2020-06-16 NOTE — Anesthesia Preprocedure Evaluation (Addendum)
Anesthesia Evaluation  Patient identified by MRN, date of birth, ID band Patient awake    Reviewed: Allergy & Precautions, NPO status , Patient's Chart, lab work & pertinent test results  History of Anesthesia Complications (+) PONV and history of anesthetic complications  Airway Mallampati: II  TM Distance: >3 FB Neck ROM: Full    Dental  (+) Teeth Intact, Dental Advisory Given   Pulmonary sleep apnea and Continuous Positive Airway Pressure Ventilation ,    Pulmonary exam normal breath sounds clear to auscultation       Cardiovascular Exercise Tolerance: Good hypertension, Pt. on medications  Rhythm:Regular Rate:Normal     Neuro/Psych  Headaches, PSYCHIATRIC DISORDERS Anxiety Depression Bipolar Disorder    GI/Hepatic Neg liver ROS, hiatal hernia, GERD (no reflux today)  Medicated and Poorly Controlled,  Endo/Other  diabetes, Well Controlled, Type 2, Oral Hypoglycemic AgentsMorbid obesity  Renal/GU negative Renal ROS     Musculoskeletal  (+) Arthritis , Osteoarthritis,    Abdominal   Peds  Hematology   Anesthesia Other Findings   Reproductive/Obstetrics                            Anesthesia Physical  Anesthesia Plan  ASA: III  Anesthesia Plan: General   Post-op Pain Management:    Induction: Intravenous  PONV Risk Score and Plan: TIVA and Treatment may vary due to age or medical condition  Airway Management Planned: Natural Airway, Nasal Cannula and Simple Face Mask  Additional Equipment:   Intra-op Plan:   Post-operative Plan:   Informed Consent: I have reviewed the patients History and Physical, chart, labs and discussed the procedure including the risks, benefits and alternatives for the proposed anesthesia with the patient or authorized representative who has indicated his/her understanding and acceptance.     Dental advisory given  Plan Discussed with: CRNA and  Surgeon  Anesthesia Plan Comments:         Anesthesia Quick Evaluation

## 2020-06-16 NOTE — Op Note (Signed)
[facility] Patient Name: Jamie [name] Procedure Date: 06/16/2020 2:50 PM MRN: [id number] Date of Birth: [birth year] Attending MD: Gennette Pac , MD CSN: [id number] Age: 49 Admit Type: Outpatient Procedure:                Upper GI endoscopy Indications:              Dysphagia Providers:                Gennette Pac, MD, Angelica Ran, Burke Keels, Technician Referring MD:              Medicines:                Propofol per Anesthesia Complications:            No immediate complications. Estimated Blood Loss:     Estimated blood loss: none. Estimated blood loss:                            none. Procedure:                Pre-Anesthesia Assessment:                           - Prior to the procedure, a History and Physical                            was performed, and patient medications and                            allergies were reviewed. The patient's tolerance of                            previous anesthesia was also reviewed. The risks                            and benefits of the procedure and the sedation                            options and risks were discussed with the patient.                            All questions were answered, and informed consent                            was obtained. Prior Anticoagulants: The patient has                            taken no previous anticoagulant or antiplatelet                            agents. After reviewing the risks and benefits, the                            patient was deemed in  satisfactory condition to                            undergo the procedure.                           After obtaining informed consent, the endoscope was                            passed under direct vision. Throughout the                            procedure, the patient's blood pressure, pulse, and                            oxygen saturations were monitored continuously. The                             [id number]-[id number] ([id number]) scope was introduced through the                            mouth, and advanced to the second part of duodenum. Scope In: 3:03:49 PM Scope Out: 3:12:35 PM Total Procedure Duration: 0 hours 8 minutes 46 seconds  Findings:      The examined esophagus was normal.      The entire examined stomach was normal.      The duodenal bulb and second portion of the duodenum were normal. The       scope was withdrawn. Dilation was performed with a Maloney dilator with       mild resistance at 54 Fr. Dilation was re-attempted, but the 75 F       Maloney dilator could not be passed The dilation site was examined       following endoscope reinsertion and showed no change. Estimated blood       loss: none. Impression:               - Normal esophagus. Dilated.                           - Normal stomach.                           - Normal duodenal bulb and second portion of the                            duodenum.                           - No specimens collected. Moderate Sedation:      Moderate (conscious) sedation was personally administered by an       anesthesia professional. The following parameters were monitored: oxygen       saturation, heart rate, blood pressure, respiratory rate, EKG, adequacy       of pulmonary ventilation, and response to care. Recommendation:           - Patient has a contact number available for  emergencies. The signs and symptoms of potential                            delayed complications were discussed with the                            patient. Return to normal activities tomorrow.                            Written discharge instructions were provided to the                            patient.                           - Resume previous diet.                           - Continue present medications except stop Protonix                            and begin Zegerid 40 mg twice daily?"before                             breakfast and supper.                           - Return to my office in 4 weeks. Procedure Code(s):        --- Professional ---                           [phone], Esophagogastroduodenoscopy, flexible,                            transoral; diagnostic, including collection of                            specimen(s) by brushing or washing, when performed                            (separate procedure)                           43450, Dilation of esophagus, by unguided sound or                            bougie, single or multiple passes Diagnosis Code(s):        --- Professional ---                           R13.10, Dysphagia, unspecified CPT copyright 2019 American Medical Association. All rights reserved. The codes documented in this report are preliminary and upon coder review may  be revised to meet current compliance requirements. Gerrit Friends. Liliahna Cudd, MD Gennette Pac, MD 06/16/2020 3:27:44 PM This report has been signed electronically. Number of Addenda: 0

## 2020-06-16 NOTE — Anesthesia Postprocedure Evaluation (Signed)
Anesthesia Post Note  Patient: Jamie [name]  Procedure(s) Performed: ESOPHAGOGASTRODUODENOSCOPY (EGD) WITH PROPOFOL ([name] ) MALONEY DILATION ([name] )  Patient location during evaluation: PACU Anesthesia Type: General Level of consciousness: awake, oriented, awake and alert and patient cooperative Pain management: pain level controlled Vital Signs Assessment: post-procedure vital signs reviewed and stable Respiratory status: spontaneous breathing, respiratory function stable and nonlabored ventilation Cardiovascular status: blood pressure returned to baseline and stable Postop Assessment: no headache and no backache Anesthetic complications: no   No complications documented.   Last Vitals:  Vitals:   06/16/20 1343 06/16/20 1427  BP: 115/78 128/73  Pulse: (!) 105   Resp: 18   Temp: 37.5 C 37.1 C  SpO2: 97% 96%    Last Pain:  Vitals:   06/16/20 1500  TempSrc:   PainSc: 0-No pain                 Brynda Peon

## 2020-06-16 NOTE — Discharge Instructions (Signed)
EGD Discharge instructions Please read the instructions outlined below and refer to this sheet in the next few weeks. These discharge instructions provide you with general information on caring for yourself after you leave the hospital. Your doctor may also give you specific instructions. While your treatment has been planned according to the most current medical practices available, unavoidable complications occasionally occur. If you have any problems or questions after discharge, please call your doctor. ACTIVITY  You may resume your regular activity but move at a slower pace for the next 24 hours.   Take frequent rest periods for the next 24 hours.   Walking will manage her reflux) expel (get rid of) the air and reduce the bloated feeling in your abdomen.   No driving for 24 hours (because of the anesthesia (medicine) used during the test).   You may shower.   Do not sign any important legal documents or operate any machinery for 24 hours (because of the anesthesia used during the test).  NUTRITION  Drink plenty of fluids.   You may resume your normal diet.   Begin with a light meal and progress to your normal diet.   Avoid alcoholic beverages for 24 hours or as instructed by your caregiver.  MEDICATIONS  You may resume your normal medications unless your caregiver tells you otherwise.  WHAT YOU CAN EXPECT TODAY  You may experience abdominal discomfort such as a feeling of fullness or gas pains.  FOLLOW-UP  Your doctor will discuss the results of your test with you.  SEEK IMMEDIATE MEDICAL ATTENTION IF ANY OF THE FOLLOWING OCCUR:  Excessive nausea (feeling sick to your stomach) and/or vomiting.   Severe abdominal pain and distention (swelling).   Trouble swallowing.   Temperature over 101 F (37.8 C).   Rectal bleeding or vomiting of blood.    Your upper endoscopy was normal today.  Your esophagus was stretched today  Stop Protonix; begin Zegerid 40 mg twice  daily-before breakfast and supper; (Medication will cause you to burp 20 to 30 minutes after taking it;   this will help control your reflux)  Office visit with Ermalinda Memos in 4 weeks  At patient request, I called Durene Romans at [phone] -left message with findings and recommendations on answering service     Monitored Anesthesia Care, Care After These instructions provide you with information about caring for yourself after your procedure. Your health care provider may also give you more specific instructions. Your treatment has been planned according to current medical practices, but problems sometimes occur. Call your health care provider if you have any problems or questions after your procedure. What can I expect after the procedure? After your procedure, you may:  Feel sleepy for several hours.  Feel clumsy and have poor balance for several hours.  Feel forgetful about what happened after the procedure.  Have poor judgment for several hours.  Feel nauseous or vomit.  Have a sore throat if you had a breathing tube during the procedure. Follow these instructions at home: For at least 24 hours after the procedure:      Have a responsible adult stay with you. It is important to have someone help care for you until you are awake and alert.  Rest as needed.  Do not: ? Participate in activities in which you could fall or become injured. ? Drive. ? Use heavy machinery. ? Drink alcohol. ? Take sleeping pills or medicines that cause drowsiness. ? Make important decisions or sign legal documents. ?  Take care of children on your own. Eating and drinking  Follow the diet that is recommended by your health care provider.  If you vomit, drink water, juice, or soup when you can drink without vomiting.  Make sure you have little or no nausea before eating solid foods. General instructions  Take over-the-counter and prescription medicines only as told by your health  care provider.  If you have sleep apnea, surgery and certain medicines can increase your risk for breathing problems. Follow instructions from your health care provider about wearing your sleep device: ? Anytime you are sleeping, including during daytime naps. ? While taking prescription pain medicines, sleeping medicines, or medicines that make you drowsy.  If you smoke, do not smoke without supervision.  Keep all follow-up visits as told by your health care provider. This is important. Contact a health care provider if:  You keep feeling nauseous or you keep vomiting.  You feel light-headed.  You develop a rash.  You have a fever. Get help right away if:  You have trouble breathing. Summary  For several hours after your procedure, you may feel sleepy and have poor judgment.  Have a responsible adult stay with you for at least 24 hours or until you are awake and alert. This information is not intended to replace advice given to you by your health care provider. Make sure you discuss any questions you have with your health care provider. Document Revised: 10/10/2017 Document Reviewed: 11/02/2015 Elsevier Patient Education  2020 [employer].

## 2020-06-16 NOTE — H&P (Signed)
@LOGO @   Primary Care Physician:  , PA-C Primary Gastroenterologist:  Dr. Encarnacion Slates  Pre-Procedure History & Physical: HPI:  Jamie [name] is a 49 y.o. female here for further evaluation of esophageal dysphagia and marginally controlled GERD.  Past Medical History:  Diagnosis Date  . Anxiety   . Arthritis    knees  . Bipolar 1 disorder (HCC)   . Chronic diarrhea   . Diverticulosis of colon   . GERD (gastroesophageal reflux disease)   . Hiatal hernia   . History of concussion    per pt 11/ 2020 w/ no residual  . Hyperlipidemia   . Hypertension    followed by pcp   (11-29-2019  per pt stated never had stress test)  . MDD (major depressive disorder)   . Migraine    neurologist--- dr 01-29-2020  . OSA on CPAP    followed by dr dohmeier  . PCOS (polycystic ovarian syndrome)   . PONV (postoperative nausea and vomiting)   . Type 2 diabetes mellitus (HCC)    followed by pcp   (11-29-2019  pt stated checks blood sugar daily in am,  fasting sugar--- 96    Past Surgical History:  Procedure Laterality Date  . ANAL FISSURE REPAIR  x3  last one  2007  approx.  2008 BIOPSY  09/06/2019   Procedure: BIOPSY;  Surgeon: 11/04/2019, MD;  Location: [facility];  Service: Endoscopy;;  . COLONOSCOPY WITH PROPOFOL [name] 09/06/2019   pancolonic diverticulosis. Segmental biopsy completed with benign biopsies.   11/04/2019 EVALUATION UNDER ANESTHESIA WITH HEMORRHOIDECTOMY [name] 12/05/2019   Procedure: ANORECTAL EXAM UNDER ANESTHESIA WITH HEMORRHOIDECTOMY, HEMORRHOIDAL LIGATION/PEXY;  Surgeon: 02/04/2020, MD;  Location: [facility];  Service: General;  Laterality: [name];  GENERAL AND LOCAL  . EVALUATION UNDER ANESTHESIA WITH TEAR DUCT PROBING  infant  . KNEE ARTHROSCOPY Right 1994  . LAPAROSCOPIC CHOLECYSTECTOMY  08-06-2002  @[facility]   . LUMBAR LAMINECTOMY/DECOMPRESSION MICRODISCECTOMY Right 01/22/2014   Procedure: LUMBAR FOUR TO FIVE LUMBAR LAMINECTOMY/DECOMPRESSION MICRODISCECTOMY 1 LEVEL;   Surgeon: , MD;  Location: [facility];  Service: Neurosurgery;  Laterality: Right;  Right L45 diskectomy    Prior to Admission medications   Medication Sig Start Date End Date Taking? Authorizing Provider  albuterol (VENTOLIN HFA) 108 (90 Base) MCG/ACT inhaler INHALE TWO PUFFS BY MOUTH EVERY 6 HOURS AS NEEDED FOR WHEEZING OR SHORTNESS OF BREATH Patient taking differently: Inhale 2 puffs into the lungs every 6 (six) hours as needed for wheezing or shortness of breath.  04/04/20  Yes Karn Cassis, NP  ARIPiprazole (ABILIFY) 10 MG tablet Take 1 tablet (10 mg total) by mouth daily. 06/04/20  Yes Plovsky, Glenford Bayley, MD  buPROPion (WELLBUTRIN XL) 150 MG 24 hr tablet Take 3 tablets (450 mg total) by mouth daily. 06/04/20  Yes Plovsky, Earvin Hansen, MD  clonazePAM (KLONOPIN) 1 MG tablet Take 1-2 tablets (1-2 mg total) by mouth See admin instructions. Take 1 mg in the morning and 2 mg at night 06/04/20  Yes Plovsky, Earvin Hansen, MD  diclofenac Sodium (VOLTAREN) 1 % GEL Apply 1 application topically 4 (four) times daily as needed (pain).   Yes [provider]  dicyclomine (BENTYL) 10 MG capsule Take 1 capsule (10 mg total) by mouth 4 (four) times daily -  before meals and at bedtime. 06/12/19  Yes Earvin Hansen, NP  DULoxetine (CYMBALTA) 60 MG capsule Take 2 capsules (120 mg total) by mouth at bedtime. 06/04/20  Yes Plovsky,  Earvin Hansen, MD  FIBER PO Take 1 capsule by mouth daily.    Yes [provider]  fluticasone (FLONASE) 50 MCG/ACT nasal spray Place 1 spray into both nostrils at bedtime as needed for allergies or rhinitis.    Yes [provider]  gabapentin (NEURONTIN) 100 MG capsule Take 200 mg by mouth at bedtime.  02/19/20  Yes [provider]  lisinopril (PRINIVIL,ZESTRIL) 5 MG tablet Take 5 mg by mouth daily.   Yes [provider]  metFORMIN (GLUCOPHAGE-XR) 500 MG 24 hr tablet Take 500 mg by mouth daily.  07/29/19  Yes [provider]  naproxen  sodium (ALEVE) 220 MG tablet Take 440 mg by mouth 2 (two) times daily as needed (pain).    Yes [provider]  ondansetron (ZOFRAN-ODT) 4 MG disintegrating tablet TAKE 1 TABLET BY MOUTH EVERY 8 HOURS AS NEEDED FOR NAUSEA AND VOMITING Patient taking differently: Take 4 mg by mouth every 8 (eight) hours as needed for nausea or vomiting.  09/03/19  Yes Gelene Mink, NP  pantoprazole (PROTONIX) 40 MG tablet Take 1 tablet (40 mg total) by mouth 2 (two) times daily before a meal. Take 30 minutes before breakfast 04/23/20  Yes Clearance Coots, Kristen S, PA-C  rosuvastatin (CRESTOR) 20 MG tablet Take 10 mg by mouth daily.    Yes [provider]  Semaglutide, 1 MG/DOSE, (OZEMPIC, 1 MG/DOSE,) 2 MG/1.5ML SOPN Inject 1 mg into the skin every Monday.   Yes [provider]  topiramate (TOPAMAX) 50 MG tablet Take 1 tablet (50 mg total) by mouth 2 (two) times daily. 02/06/18  Yes Nilda Riggs, NP  traZODone (DESYREL) 100 MG tablet Take 4 tablets (400 mg total) by mouth at bedtime. 06/04/20  Yes Archer Asa, MD  ACCU-CHEK AVIVA PLUS test strip  02/15/20   [provider]  Accu-Chek Softclix Lancets lancets AS DIRECTED TO CHECK BLOOD SUGAR ONCE TO TWICE DAILY 12/17/19   [provider]    Allergies as of 04/14/2020 - Review Complete 04/02/2020  Allergen Reaction Noted  . Codeine Hives and Swelling 06/07/2011  . Morphine and related Swelling and Rash 01/21/2014    Family History  Problem Relation Age of Onset  . COPD Father   . Anxiety disorder Maternal Grandmother   . Depression Maternal Grandmother   . Colon cancer Neg Hx   . Colon polyps Neg Hx     Social History   Socioeconomic History  . Marital status: Married    Spouse name: Not on file  . Number of children: 0  . Years of education: college  . Highest education level: Not on file  Occupational History  . Occupation: disabled  Tobacco Use  . Smoking status: Never Smoker  . Smokeless tobacco:  Never Used  Vaping Use  . Vaping Use: Never used  Substance and Sexual Activity  . Alcohol use: No  . Drug use: Never  . Sexual activity: Yes    Partners: Male    Birth control/protection: None  Other Topics Concern  . Not on file  Social History Narrative   Patient lives at home alone    Patient is right handed   Patient drinks soda's daily   Social Determinants of Health   Financial Resource Strain:   . Difficulty of Paying Living Expenses: Not on file  Food Insecurity:   . Worried About Programme researcher, broadcasting/film/video in the Last Year: Not on file  . Ran Out of Food in the Last Year: Not  on file  Transportation Needs:   . Lack of Transportation (Medical): Not on file  . Lack of Transportation (Non-Medical): Not on file  Physical Activity:   . Days of Exercise per Week: Not on file  . Minutes of Exercise per Session: Not on file  Stress:   . Feeling of Stress : Not on file  Social Connections:   . Frequency of Communication with Friends and Family: Not on file  . Frequency of Social Gatherings with Friends and Family: Not on file  . Attends Religious Services: Not on file  . Active Member of Clubs or Organizations: Not on file  . Attends Banker Meetings: Not on file  . Marital Status: Not on file  Intimate Partner Violence:   . Fear of Current or Ex-Partner: Not on file  . Emotionally Abused: Not on file  . Physically Abused: Not on file  . Sexually Abused: Not on file    Review of Systems: See HPI, otherwise negative ROS  Physical Exam: BP 115/78   Pulse (!) 105   Temp 99.5 F (37.5 C) (Oral)   Resp 18   Ht 5\' 3"  (1.6 m)   Wt 110.7 kg   SpO2 97%   BMI 43.23 kg/m  General:   Alert,  Well-developed, well-nourished, pleasant and cooperative in NAD Neck:  Supple; no masses or thyromegaly. No significant cervical adenopathy. Lungs:  Clear throughout to auscultation.   No wheezes, crackles, or rhonchi. No acute distress. Heart:  Regular rate and rhythm; no  murmurs, clicks, rubs,  or gallops. Abdomen: Non-distended, normal bowel sounds.  Soft and nontender without appreciable mass or hepatosplenomegaly.  Pulses:  Normal pulses noted. Extremities:  Without clubbing or edema.  Impression/Plan: 49 year old lady here for further evaluation of esophageal dysphagia and marginally controlled GERD via EGD.  I discussed the approach of EGD with esophageal dilation as feasible/appropriate per plan. The risks, benefits, limitations, alternatives and imponderables have been reviewed with the patient. Potential for esophageal dilation, biopsy, etc. have also been reviewed.  Questions have been answered. All parties agreeable.     Notice: This dictation was prepared with Dragon dictation along with smaller phrase technology. Any transcriptional errors that result from this process are unintentional and may not be corrected upon review.

## 2020-06-16 NOTE — Transfer of Care (Signed)
Immediate Anesthesia Transfer of Care Note  Patient: Jamie [name]  Procedure(s) Performed: ESOPHAGOGASTRODUODENOSCOPY (EGD) WITH PROPOFOL ([name] ) MALONEY DILATION ([name] )  Patient Location: PACU  Anesthesia Type:General  Level of Consciousness: awake, alert , oriented and patient cooperative  Airway & Oxygen Therapy: Patient Spontanous Breathing and Patient connected to nasal cannula oxygen  Post-op Assessment: Report given to RN, Post -op Vital signs reviewed and stable and Patient moving all extremities  Post vital signs: Reviewed and stable  Last Vitals:  Vitals Value Taken Time  BP    Temp    Pulse 123 06/16/20 1518  Resp 13 06/16/20 1518  SpO2 92 % 06/16/20 1518  Vitals shown include unvalidated device data.  Last Pain:  Vitals:   06/16/20 1500  TempSrc:   PainSc: 0-No pain      Patients Stated Pain Goal: 5 (06/16/20 1343)  Complications: No complications documented.

## 2020-06-17 LAB — GLUCOSE, CAPILLARY: Glucose-Capillary: 137 mg/dL — ABNORMAL HIGH (ref 70–99)

## 2020-06-17 MED ORDER — LIDOCAINE VISCOUS HCL 2 % MT SOLN
OROMUCOSAL | Status: AC
  Filled 2020-06-17: qty 15

## 2020-06-17 MED ORDER — GLYCOPYRROLATE 0.2 MG/ML IJ SOLN
INTRAMUSCULAR | Status: AC
  Filled 2020-06-17: qty 1

## 2020-06-23 ENCOUNTER — Encounter (HOSPITAL_COMMUNITY): Payer: Self-pay | Admitting: Internal Medicine

## 2020-06-24 DIAGNOSIS — E7849 Other hyperlipidemia: Secondary | ICD-10-CM | POA: Diagnosis not present

## 2020-06-24 DIAGNOSIS — I10 Essential (primary) hypertension: Secondary | ICD-10-CM | POA: Diagnosis not present

## 2020-06-24 DIAGNOSIS — E119 Type 2 diabetes mellitus without complications: Secondary | ICD-10-CM | POA: Diagnosis not present

## 2020-06-25 ENCOUNTER — Telehealth: Payer: Self-pay

## 2020-06-25 NOTE — Telephone Encounter (Signed)
PA for Omeprazole was approved through [email or web address]. approval letter will be scanned in chart when received. Pt and pharmacy are aware of approval.

## 2020-07-03 ENCOUNTER — Ambulatory Visit: Payer: Medicare Other | Admitting: Neurology

## 2020-07-17 DIAGNOSIS — J069 Acute upper respiratory infection, unspecified: Secondary | ICD-10-CM | POA: Diagnosis not present

## 2020-07-25 DIAGNOSIS — E7849 Other hyperlipidemia: Secondary | ICD-10-CM | POA: Diagnosis not present

## 2020-07-25 DIAGNOSIS — Z7984 Long term (current) use of oral hypoglycemic drugs: Secondary | ICD-10-CM | POA: Diagnosis not present

## 2020-07-25 DIAGNOSIS — I10 Essential (primary) hypertension: Secondary | ICD-10-CM | POA: Diagnosis not present

## 2020-07-25 DIAGNOSIS — E119 Type 2 diabetes mellitus without complications: Secondary | ICD-10-CM | POA: Diagnosis not present

## 2020-07-28 DIAGNOSIS — J039 Acute tonsillitis, unspecified: Secondary | ICD-10-CM | POA: Diagnosis not present

## 2020-07-28 DIAGNOSIS — B370 Candidal stomatitis: Secondary | ICD-10-CM | POA: Diagnosis not present

## 2020-07-28 DIAGNOSIS — J069 Acute upper respiratory infection, unspecified: Secondary | ICD-10-CM | POA: Diagnosis not present

## 2020-07-29 NOTE — Progress Notes (Deleted)
Referring Provider: Joeseph Amor Primary Care Physician:  Joeseph Amor Primary GI Physician: Dr. [name]  No chief complaint on file.   HPI:   Jamie [name] is a 50 y.o. female with history of chronic diarrhea, chronic epigastric abdominal pain, nausea, and GERD. Stool studies including culture, O&P, and C. difficile negative in October 2020.  Colonoscopy with negative segmental biopsies in February 2021. Also with suspected perianal fistula on CT/MRI in Feb/March 2021 s/p internal and external hemorrhoidectomy x2, internal hemorrhoid ligation and pexy, and anorectal exam under anesthesia on 12/05/19. No evidence of fissure,fistula,or abscess. She is presenting today for follow-up of upper abdominal pain and dysphagia s/p EGD.  She was last seen in our office 03/20/2020.  She reported constant epigastric discomfort that was worsened by eating with associated nausea without vomiting and early satiety.  Symptoms began around November 2020.  GERD well controlled on Protonix 40 mg daily.  Intermittent solid food dysphagia.  Diarrhea improved with 1-2 soft, formed BMs daily taking Bentyl 3 times daily before meals and at bedtime.  Previously took pancreatic enzymes for about 3 months which she also felt helped, but ended up with constipation when combined with Bentyl.  Feels she needs Bentyl as she had previously been struggling with a lot of abdominal cramping that Bentyl has resolved.   EGD 06/16/2020: Esophagus, stomach, and examined duodenum entirely normal s/p empiric esophageal dilation.  Advised to stop Protonix and start Zegerid 40 mg twice daily.  Today:     Check for celiac disease  Past Medical History:  Diagnosis Date  . Anxiety   . Arthritis    knees  . Bipolar 1 disorder (HCC)   . Chronic diarrhea   . Diverticulosis of colon   . GERD (gastroesophageal reflux disease)   . Hiatal hernia   . History of concussion    per pt 11/ 2020 w/ no residual  .  Hyperlipidemia   . Hypertension    followed by pcp   (11-29-2019  per pt stated never had stress test)  . MDD (major depressive disorder)   . Migraine    neurologist--- dr Terrace Arabia  . OSA on CPAP    followed by dr dohmeier  . PCOS (polycystic ovarian syndrome)   . PONV (postoperative nausea and vomiting)   . Type 2 diabetes mellitus (HCC)    followed by pcp   (11-29-2019  pt stated checks blood sugar daily in am,  fasting sugar--- 96    Past Surgical History:  Procedure Laterality Date  . ANAL FISSURE REPAIR  x3  last one  2007  approx.  [name] Kitchen BIOPSY  09/06/2019   Procedure: BIOPSY;  Surgeon: [name], MD;  Location: [facility];  Service: Endoscopy;;  . COLONOSCOPY WITH PROPOFOL [name] 09/06/2019   pancolonic diverticulosis. Segmental biopsy completed with benign biopsies.   . ESOPHAGOGASTRODUODENOSCOPY (EGD) WITH PROPOFOL [name] 06/16/2020   Procedure: ESOPHAGOGASTRODUODENOSCOPY (EGD) WITH PROPOFOL;  Surgeon: [name], MD;  Location: [facility];  Service: Endoscopy;  Laterality: [name];  3:00pm  . EVALUATION UNDER ANESTHESIA WITH HEMORRHOIDECTOMY [name] 12/05/2019   Procedure: ANORECTAL EXAM UNDER ANESTHESIA WITH HEMORRHOIDECTOMY, HEMORRHOIDAL LIGATION/PEXY;  Surgeon: [name], MD;  Location: [facility];  Service: General;  Laterality: [name];  GENERAL AND LOCAL  . EVALUATION UNDER ANESTHESIA WITH TEAR DUCT PROBING  infant  . KNEE ARTHROSCOPY Right 1994  . LAPAROSCOPIC CHOLECYSTECTOMY  08-06-2002  @[facility]   . LUMBAR LAMINECTOMY/DECOMPRESSION MICRODISCECTOMY Right 01/22/2014  Procedure: LUMBAR FOUR TO FIVE LUMBAR LAMINECTOMY/DECOMPRESSION MICRODISCECTOMY 1 LEVEL;  Surgeon: Floyce Stakes, MD;  Location: [facility];  Service: Neurosurgery;  Laterality: Right;  Right L45 diskectomy  . MALONEY DILATION [name] 06/16/2020   Procedure: Venia Minks DILATION;  Surgeon: Daneil Dolin, MD;  Location: [facility];  Service: Endoscopy;  Laterality: [name];    Current Outpatient  Medications  Medication Sig Dispense Refill  . ACCU-CHEK AVIVA PLUS test strip     . Accu-Chek Softclix Lancets lancets AS DIRECTED TO CHECK BLOOD SUGAR ONCE TO TWICE DAILY    . albuterol (VENTOLIN HFA) 108 (90 Base) MCG/ACT inhaler INHALE TWO PUFFS BY MOUTH EVERY 6 HOURS AS NEEDED FOR WHEEZING OR SHORTNESS OF BREATH (Patient taking differently: Inhale 2 puffs into the lungs every 6 (six) hours as needed for wheezing or shortness of breath. ) 8.5 g 1  . ARIPiprazole (ABILIFY) 10 MG tablet Take 1 tablet (10 mg total) by mouth daily. 30 tablet 5  . buPROPion (WELLBUTRIN XL) 150 MG 24 hr tablet Take 3 tablets (450 mg total) by mouth daily. 90 tablet 5  . clonazePAM (KLONOPIN) 1 MG tablet Take 1-2 tablets (1-2 mg total) by mouth See admin instructions. Take 1 mg in the morning and 2 mg at night 90 tablet 4  . diclofenac Sodium (VOLTAREN) 1 % GEL Apply 1 application topically 4 (four) times daily as needed (pain).    [name] Kitchen dicyclomine (BENTYL) 10 MG capsule Take 1 capsule (10 mg total) by mouth 4 (four) times daily -  before meals and at bedtime. 120 capsule 3  . DULoxetine (CYMBALTA) 60 MG capsule Take 2 capsules (120 mg total) by mouth at bedtime. 60 capsule 4  . FIBER PO Take 1 capsule by mouth daily.     . fluticasone (FLONASE) 50 MCG/ACT nasal spray Place 1 spray into both nostrils at bedtime as needed for allergies or rhinitis.     [name] Kitchen gabapentin (NEURONTIN) 100 MG capsule Take 200 mg by mouth at bedtime.     [name] Kitchen lisinopril (PRINIVIL,ZESTRIL) 5 MG tablet Take 5 mg by mouth daily.    . metFORMIN (GLUCOPHAGE-XR) 500 MG 24 hr tablet Take 500 mg by mouth daily.     . naproxen sodium (ALEVE) 220 MG tablet Take 440 mg by mouth 2 (two) times daily as needed (pain).     . ondansetron (ZOFRAN-ODT) 4 MG disintegrating tablet TAKE 1 TABLET BY MOUTH EVERY 8 HOURS AS NEEDED FOR NAUSEA AND VOMITING (Patient taking differently: Take 4 mg by mouth every 8 (eight) hours as needed for nausea or vomiting. ) 40 tablet 3  .  rosuvastatin (CRESTOR) 20 MG tablet Take 10 mg by mouth daily.     . Semaglutide, 1 MG/DOSE, (OZEMPIC, 1 MG/DOSE,) 2 MG/1.5ML SOPN Inject 1 mg into the skin every Monday.    . topiramate (TOPAMAX) 50 MG tablet Take 1 tablet (50 mg total) by mouth 2 (two) times daily. 180 tablet 3  . traZODone (DESYREL) 100 MG tablet Take 4 tablets (400 mg total) by mouth at bedtime. 120 tablet 4   No current facility-administered medications for this visit.    Allergies as of 07/30/2020 - Review Complete 06/16/2020  Allergen Reaction Noted  . Codeine Hives and Swelling 06/07/2011  . Morphine and related Swelling and Rash 01/21/2014    Family History  Problem Relation Age of Onset  . COPD Father   . Anxiety disorder Maternal Grandmother   . Depression Maternal Grandmother   . Colon  cancer Neg Hx   . Colon polyps Neg Hx     Social History   Socioeconomic History  . Marital status: Married    Spouse name: Not on file  . Number of children: 0  . Years of education: college  . Highest education level: Not on file  Occupational History  . Occupation: disabled  Tobacco Use  . Smoking status: Never Smoker  . Smokeless tobacco: Never Used  Vaping Use  . Vaping Use: Never used  Substance and Sexual Activity  . Alcohol use: No  . Drug use: Never  . Sexual activity: Yes    Partners: Male    Birth control/protection: None  Other Topics Concern  . Not on file  Social History Narrative   Patient lives at home alone    Patient is right handed   Patient drinks soda's daily   Social Determinants of Health   Financial Resource Strain: Not on file  Food Insecurity: Not on file  Transportation Needs: Not on file  Physical Activity: Not on file  Stress: Not on file  Social Connections: Not on file    Review of Systems: Gen: Denies fever, chills, anorexia. Denies fatigue, weakness, weight loss.  CV: Denies chest pain, palpitations, syncope, peripheral edema, and claudication. Resp: Denies  dyspnea at rest, cough, wheezing, coughing up blood, and pleurisy. GI: Denies vomiting blood, jaundice, and fecal incontinence.   Denies dysphagia or odynophagia. Derm: Denies rash, itching, dry skin Psych: Denies depression, anxiety, memory loss, confusion. No homicidal or suicidal ideation.  Heme: Denies bruising, bleeding, and enlarged lymph nodes.  Physical Exam: There were no vitals taken for this visit. General:   Alert and oriented. No distress noted. Pleasant and cooperative.  Head:  Normocephalic and atraumatic. Eyes:  Conjuctiva clear without scleral icterus. Mouth:  Oral mucosa pink and moist. Good dentition. No lesions. Heart:  S1, S2 present without murmurs appreciated. Lungs:  Clear to auscultation bilaterally. No wheezes, rales, or rhonchi. No distress.  Abdomen:  +BS, soft, non-tender and non-distended. No rebound or guarding. No HSM or masses noted. Msk:  Symmetrical without gross deformities. Normal posture. Extremities:  Without edema. Neurologic:  Alert and  oriented x4 Psych:  Alert and cooperative. Normal mood and affect.

## 2020-07-30 ENCOUNTER — Ambulatory Visit: Payer: Medicare Other | Admitting: Gastroenterology

## 2020-07-31 ENCOUNTER — Encounter: Payer: Self-pay | Admitting: Internal Medicine

## 2020-08-11 ENCOUNTER — Ambulatory Visit: Payer: Medicare Other | Admitting: Neurology

## 2020-08-14 DIAGNOSIS — R059 Cough, unspecified: Secondary | ICD-10-CM | POA: Diagnosis not present

## 2020-08-14 DIAGNOSIS — J020 Streptococcal pharyngitis: Secondary | ICD-10-CM | POA: Diagnosis not present

## 2020-08-14 DIAGNOSIS — B370 Candidal stomatitis: Secondary | ICD-10-CM | POA: Diagnosis not present

## 2020-08-23 DIAGNOSIS — E119 Type 2 diabetes mellitus without complications: Secondary | ICD-10-CM | POA: Diagnosis not present

## 2020-08-23 DIAGNOSIS — Z7984 Long term (current) use of oral hypoglycemic drugs: Secondary | ICD-10-CM | POA: Diagnosis not present

## 2020-08-23 DIAGNOSIS — I10 Essential (primary) hypertension: Secondary | ICD-10-CM | POA: Diagnosis not present

## 2020-08-23 DIAGNOSIS — E7849 Other hyperlipidemia: Secondary | ICD-10-CM | POA: Diagnosis not present

## 2020-08-27 ENCOUNTER — Other Ambulatory Visit: Payer: Self-pay

## 2020-08-27 ENCOUNTER — Telehealth (INDEPENDENT_AMBULATORY_CARE_PROVIDER_SITE_OTHER): Payer: Medicare Other | Admitting: Psychiatry

## 2020-08-27 DIAGNOSIS — F323 Major depressive disorder, single episode, severe with psychotic features: Secondary | ICD-10-CM | POA: Diagnosis not present

## 2020-08-27 DIAGNOSIS — F324 Major depressive disorder, single episode, in partial remission: Secondary | ICD-10-CM

## 2020-08-27 MED ORDER — BUPROPION HCL ER (XL) 150 MG PO TB24
450.0000 mg | ORAL_TABLET | Freq: Every day | ORAL | 5 refills | Status: DC
Start: 2020-08-27 — End: 2020-11-25

## 2020-08-27 MED ORDER — CLONAZEPAM 1 MG PO TABS
1.0000 mg | ORAL_TABLET | ORAL | 4 refills | Status: DC
Start: 2020-08-27 — End: 2020-11-25

## 2020-08-27 MED ORDER — TRAZODONE HCL 100 MG PO TABS
400.0000 mg | ORAL_TABLET | Freq: Every day | ORAL | 4 refills | Status: DC
Start: 2020-08-27 — End: 2020-11-25

## 2020-08-27 MED ORDER — DULOXETINE HCL 60 MG PO CPEP: 120.0000 mg | ORAL_CAPSULE | Freq: Every day | ORAL | 4 refills | Status: DC

## 2020-08-27 MED ORDER — ARIPIPRAZOLE 10 MG PO TABS: 10.0000 mg | ORAL_TABLET | Freq: Every day | ORAL | 5 refills | Status: DC

## 2020-08-27 NOTE — Progress Notes (Signed)
Patient ID: Jamie [name], female   DOB: May 11, 1971, 50 y.o.   MRN: [id number] [facility] MD Progress Note  08/27/2020 4:20 PM LOUCINDA CROY  MRN:  [id number] Subjective:  Feeling well. Principal Problem: Major depression recurrent  Today the patient is doing fairly well.  She is clearly is better than she has been months ago.  Emotionally she is better.  For the last 4 to 6 weeks has been having a lot of upper respiratory problems.  She is been on antibiotics.  She seems to be COVID negative but she is having multiple respiratory infections.  She is slowly getting better.  She is eating and sleeping fairly well.  She takes all her medicines as prescribed.  She lives with her husband and her mother and her sister.  She is not using drugs or alcohol.  Her family member was murdered and they recently have found the person who they think it.  Patient seems to be surviving this problem fairly well.  She continues in one-to-one therapy.  She denies persistent depression.  At this time she has no psychosis.  She takes her medicines just as prescribed.  Her relationship with her husband seems to be good.  She likes where she lives at this time.  There are has many family members in her household.  The patient seems to be getting herself together better.  She is slowly physically recovering.  She is not suicidal. Past Medical History:  Diagnosis Date  . Anxiety   . Arthritis    knees  . Bipolar 1 disorder (HCC)   . Chronic diarrhea   . Diverticulosis of colon   . GERD (gastroesophageal reflux disease)   . Hiatal hernia   . History of concussion    per pt 11/ 2020 w/ no residual  . Hyperlipidemia   . Hypertension    followed by pcp   (11-29-2019  per pt stated never had stress test)  . MDD (major depressive disorder)   . Migraine    neurologist--- dr Terrace Arabia  . OSA on CPAP    followed by dr dohmeier  . PCOS (polycystic ovarian syndrome)   . PONV (postoperative nausea and vomiting)   . Type 2 diabetes  mellitus (HCC)    followed by pcp   (11-29-2019  pt stated checks blood sugar daily in am,  fasting sugar--- 96    Past Surgical History:  Procedure Laterality Date  . ANAL FISSURE REPAIR  x3  last one  2007  approx.  [name] Kitchen BIOPSY  09/06/2019   Procedure: BIOPSY;  Surgeon: [name], MD;  Location: [facility];  Service: Endoscopy;;  . COLONOSCOPY WITH PROPOFOL [name] 09/06/2019   pancolonic diverticulosis. Segmental biopsy completed with benign biopsies.   . ESOPHAGOGASTRODUODENOSCOPY (EGD) WITH PROPOFOL [name] 06/16/2020   Procedure: ESOPHAGOGASTRODUODENOSCOPY (EGD) WITH PROPOFOL;  Surgeon: [name], MD;  Location: [facility];  Service: Endoscopy;  Laterality: [name];  3:00pm  . EVALUATION UNDER ANESTHESIA WITH HEMORRHOIDECTOMY [name] 12/05/2019   Procedure: ANORECTAL EXAM UNDER ANESTHESIA WITH HEMORRHOIDECTOMY, HEMORRHOIDAL LIGATION/PEXY;  Surgeon: [name], MD;  Location: [facility];  Service: General;  Laterality: [name];  GENERAL AND LOCAL  . EVALUATION UNDER ANESTHESIA WITH TEAR DUCT PROBING  infant  . KNEE ARTHROSCOPY Right 1994  . LAPAROSCOPIC CHOLECYSTECTOMY  08-06-2002  @[facility]   . LUMBAR LAMINECTOMY/DECOMPRESSION MICRODISCECTOMY Right 01/22/2014   Procedure: LUMBAR FOUR TO FIVE LUMBAR LAMINECTOMY/DECOMPRESSION MICRODISCECTOMY 1 LEVEL;  Surgeon: 01/24/2014, MD;  Location: [facility]  NEURO [facility];  Service: Neurosurgery;  Laterality: Right;  Right L45 diskectomy  . MALONEY DILATION [name] 06/16/2020   Procedure: Elease [name] DILATION;  Surgeon: [name], MD;  Location: [facility];  Service: Endoscopy;  Laterality: [name];   Family History:  Family History  Problem Relation Age of Onset  . COPD Father   . Anxiety disorder Maternal Grandmother   . Depression Maternal Grandmother   . Colon cancer Neg Hx   . Colon polyps Neg Hx    Family Psychiatric  History:  Social History:  Social History   Substance and Sexual Activity  Alcohol Use No     Social History    Substance and Sexual Activity  Drug Use Never    Social History   Socioeconomic History  . Marital status: Married    Spouse name: Not on file  . Number of children: 0  . Years of education: college  . Highest education level: Not on file  Occupational History  . Occupation: disabled  Tobacco Use  . Smoking status: Never Smoker  . Smokeless tobacco: Never Used  Vaping Use  . Vaping Use: Never used  Substance and Sexual Activity  . Alcohol use: No  . Drug use: Never  . Sexual activity: Yes    Partners: Male    Birth control/protection: None  Other Topics Concern  . Not on file  Social History Narrative   Patient lives at home alone    Patient is right handed   Patient drinks soda's daily   Social Determinants of Health   Financial Resource Strain: Not on file  Food Insecurity: Not on file  Transportation Needs: Not on file  Physical Activity: Not on file  Stress: Not on file  Social Connections: Not on file   Additional Social History:                         Sleep: Fair  Appetite:  Good  Current Medications: Current Outpatient Medications  Medication Sig Dispense Refill  . ACCU-CHEK AVIVA PLUS test strip     . Accu-Chek Softclix Lancets lancets AS DIRECTED TO CHECK BLOOD SUGAR ONCE TO TWICE DAILY    . albuterol (VENTOLIN HFA) 108 (90 Base) MCG/ACT inhaler INHALE TWO PUFFS BY MOUTH EVERY 6 HOURS AS NEEDED FOR WHEEZING OR SHORTNESS OF BREATH (Patient taking differently: Inhale 2 puffs into the lungs every 6 (six) hours as needed for wheezing or shortness of breath. ) 8.5 g 1  . ARIPiprazole (ABILIFY) 10 MG tablet Take 1 tablet (10 mg total) by mouth daily. 30 tablet 5  . buPROPion (WELLBUTRIN XL) 150 MG 24 hr tablet Take 3 tablets (450 mg total) by mouth daily. 90 tablet 5  . clonazePAM (KLONOPIN) 1 MG tablet Take 1-2 tablets (1-2 mg total) by mouth See admin instructions. Take 1 mg in the morning and 2 mg at night 90 tablet 4  . diclofenac Sodium  (VOLTAREN) 1 % GEL Apply 1 application topically 4 (four) times daily as needed (pain).    [name] Kitchen dicyclomine (BENTYL) 10 MG capsule Take 1 capsule (10 mg total) by mouth 4 (four) times daily -  before meals and at bedtime. 120 capsule 3  . DULoxetine (CYMBALTA) 60 MG capsule Take 2 capsules (120 mg total) by mouth at bedtime. 60 capsule 4  . FIBER PO Take 1 capsule by mouth daily.     . fluticasone (FLONASE) 50 MCG/ACT nasal spray Place 1 spray into both nostrils  at bedtime as needed for allergies or rhinitis.     [name] Kitchen gabapentin (NEURONTIN) 100 MG capsule Take 200 mg by mouth at bedtime.     [name] Kitchen lisinopril (PRINIVIL,ZESTRIL) 5 MG tablet Take 5 mg by mouth daily.    . metFORMIN (GLUCOPHAGE-XR) 500 MG 24 hr tablet Take 500 mg by mouth daily.     . naproxen sodium (ALEVE) 220 MG tablet Take 440 mg by mouth 2 (two) times daily as needed (pain).     . ondansetron (ZOFRAN-ODT) 4 MG disintegrating tablet TAKE 1 TABLET BY MOUTH EVERY 8 HOURS AS NEEDED FOR NAUSEA AND VOMITING (Patient taking differently: Take 4 mg by mouth every 8 (eight) hours as needed for nausea or vomiting. ) 40 tablet 3  . rosuvastatin (CRESTOR) 20 MG tablet Take 10 mg by mouth daily.     . Semaglutide, 1 MG/DOSE, (OZEMPIC, 1 MG/DOSE,) 2 MG/1.5ML SOPN Inject 1 mg into the skin every Monday.    . topiramate (TOPAMAX) 50 MG tablet Take 1 tablet (50 mg total) by mouth 2 (two) times daily. 180 tablet 3  . traZODone (DESYREL) 100 MG tablet Take 4 tablets (400 mg total) by mouth at bedtime. 120 tablet 4   No current facility-administered medications for this visit.    Lab Results: No results found for this or any previous visit (from the past 48 hour(s)).  Physical Findings: AIMS:  , ,  ,  ,    CIWA:    COWS:     Musculoskeletal: Strength & Muscle Tone: within normal limits Gait & Station: normal Patient leans: Right  Psychiatric Specialty Exam: ROS  There were no vitals taken for this visit.There is no height or weight on file to  calculate BMI.  General Appearance: Casual  Eye Contact::  Good  Speech:  Clear and Coherent  Volume:  Normal  Mood:Depressed  Affect:  Appropriate  Today the patient is doing very well. Thought Process:  Coherent  Orientation:  Full (Time, Place, and Person)  Thought Content:  NA and WDL  Suicidal Thoughts:  yes  Homicidal Thoughts:  No  Memory:  Immediate;   NA  Judgement:  Good  Insight:  Good  Psychomotor Activity:  NA  Concentration:  Good  Recall:  Good  Fund of Knowledge:Good  Language: Good  Akathisia:  No  Handed:  Right  AIMS (if indicated):     Assets:  Desire for Improvement  ADL's:  Intact  Cognition: WNL  Sleep:      Treatment Plan Summary: 08/27/2020, 4:20 PM  This patient's #1 problem is that of major depression.  She is doing well taking Cymbalta 60 mg 2 a day and Wellbutrin 150 mg 3 a day.  In the past her depression was significant enough that she had psychotic symptoms.  She is now on Abilify 10 mg and has done very well.  She has no evidence of tardive dyskinesia.  The possibility of coming down on her Abilify should be considered.  Her second problem is that of insomnia.  The patient will continue taking 400 mg of trazodone.  Her second problem is an adjustment disorder with an anxious mood state she will continue taking Klonopin 1 mg in the morning and 2 mg at night.  This patient will continue on one-to-one therapy.  She will return to see me hopefully in the office in 3 months.  Generally she is stable recovering from a medical condition.  She seems to be resilient.

## 2020-09-03 DIAGNOSIS — J029 Acute pharyngitis, unspecified: Secondary | ICD-10-CM | POA: Diagnosis not present

## 2020-09-03 DIAGNOSIS — R059 Cough, unspecified: Secondary | ICD-10-CM | POA: Diagnosis not present

## 2020-09-05 DIAGNOSIS — Z720 Tobacco use: Secondary | ICD-10-CM | POA: Diagnosis not present

## 2020-09-05 DIAGNOSIS — J1282 Pneumonia due to coronavirus disease 2019: Secondary | ICD-10-CM | POA: Diagnosis not present

## 2020-09-05 DIAGNOSIS — Z885 Allergy status to narcotic agent status: Secondary | ICD-10-CM | POA: Diagnosis not present

## 2020-09-05 DIAGNOSIS — R7881 Bacteremia: Secondary | ICD-10-CM | POA: Diagnosis not present

## 2020-09-05 DIAGNOSIS — R079 Chest pain, unspecified: Secondary | ICD-10-CM | POA: Diagnosis not present

## 2020-09-05 DIAGNOSIS — R069 Unspecified abnormalities of breathing: Secondary | ICD-10-CM | POA: Diagnosis not present

## 2020-09-05 DIAGNOSIS — R0602 Shortness of breath: Secondary | ICD-10-CM | POA: Diagnosis not present

## 2020-09-05 DIAGNOSIS — E119 Type 2 diabetes mellitus without complications: Secondary | ICD-10-CM | POA: Diagnosis not present

## 2020-09-05 DIAGNOSIS — J9601 Acute respiratory failure with hypoxia: Secondary | ICD-10-CM | POA: Diagnosis not present

## 2020-09-05 DIAGNOSIS — Z794 Long term (current) use of insulin: Secondary | ICD-10-CM | POA: Diagnosis not present

## 2020-09-05 DIAGNOSIS — E1165 Type 2 diabetes mellitus with hyperglycemia: Secondary | ICD-10-CM | POA: Diagnosis not present

## 2020-09-05 DIAGNOSIS — I10 Essential (primary) hypertension: Secondary | ICD-10-CM | POA: Diagnosis not present

## 2020-09-05 DIAGNOSIS — Z7901 Long term (current) use of anticoagulants: Secondary | ICD-10-CM | POA: Diagnosis not present

## 2020-09-05 DIAGNOSIS — G4733 Obstructive sleep apnea (adult) (pediatric): Secondary | ICD-10-CM | POA: Diagnosis not present

## 2020-09-05 DIAGNOSIS — R0600 Dyspnea, unspecified: Secondary | ICD-10-CM | POA: Diagnosis not present

## 2020-09-05 DIAGNOSIS — R0902 Hypoxemia: Secondary | ICD-10-CM | POA: Diagnosis not present

## 2020-09-05 DIAGNOSIS — U071 COVID-19: Secondary | ICD-10-CM | POA: Diagnosis not present

## 2020-09-05 DIAGNOSIS — F17210 Nicotine dependence, cigarettes, uncomplicated: Secondary | ICD-10-CM | POA: Diagnosis not present

## 2020-09-05 DIAGNOSIS — B9729 Other coronavirus as the cause of diseases classified elsewhere: Secondary | ICD-10-CM | POA: Diagnosis not present

## 2020-09-06 DIAGNOSIS — J1282 Pneumonia due to coronavirus disease 2019: Secondary | ICD-10-CM | POA: Insufficient documentation

## 2020-09-08 ENCOUNTER — Ambulatory Visit: Payer: Medicare Other | Admitting: Gastroenterology

## 2020-09-22 DIAGNOSIS — E119 Type 2 diabetes mellitus without complications: Secondary | ICD-10-CM | POA: Diagnosis not present

## 2020-09-22 DIAGNOSIS — I10 Essential (primary) hypertension: Secondary | ICD-10-CM | POA: Diagnosis not present

## 2020-09-22 DIAGNOSIS — Z7984 Long term (current) use of oral hypoglycemic drugs: Secondary | ICD-10-CM | POA: Diagnosis not present

## 2020-09-22 DIAGNOSIS — E7849 Other hyperlipidemia: Secondary | ICD-10-CM | POA: Diagnosis not present

## 2020-10-07 DIAGNOSIS — I517 Cardiomegaly: Secondary | ICD-10-CM | POA: Diagnosis not present

## 2020-10-07 DIAGNOSIS — I519 Heart disease, unspecified: Secondary | ICD-10-CM | POA: Diagnosis not present

## 2020-10-07 DIAGNOSIS — R7881 Bacteremia: Secondary | ICD-10-CM | POA: Diagnosis not present

## 2020-10-07 DIAGNOSIS — I5189 Other ill-defined heart diseases: Secondary | ICD-10-CM | POA: Diagnosis not present

## 2020-10-09 DIAGNOSIS — K219 Gastro-esophageal reflux disease without esophagitis: Secondary | ICD-10-CM | POA: Diagnosis not present

## 2020-10-09 DIAGNOSIS — E119 Type 2 diabetes mellitus without complications: Secondary | ICD-10-CM | POA: Diagnosis not present

## 2020-10-09 DIAGNOSIS — I10 Essential (primary) hypertension: Secondary | ICD-10-CM | POA: Diagnosis not present

## 2020-10-09 DIAGNOSIS — E7849 Other hyperlipidemia: Secondary | ICD-10-CM | POA: Diagnosis not present

## 2020-10-09 DIAGNOSIS — E781 Pure hyperglyceridemia: Secondary | ICD-10-CM | POA: Diagnosis not present

## 2020-10-09 DIAGNOSIS — E782 Mixed hyperlipidemia: Secondary | ICD-10-CM | POA: Diagnosis not present

## 2020-10-12 DIAGNOSIS — U071 COVID-19: Secondary | ICD-10-CM | POA: Diagnosis not present

## 2020-10-12 DIAGNOSIS — J1282 Pneumonia due to coronavirus disease 2019: Secondary | ICD-10-CM | POA: Diagnosis not present

## 2020-10-14 ENCOUNTER — Encounter: Payer: Self-pay | Admitting: Gastroenterology

## 2020-10-14 ENCOUNTER — Ambulatory Visit: Payer: Medicare Other | Admitting: Gastroenterology

## 2020-10-14 NOTE — Progress Notes (Deleted)
Primary Care Physician: Encarnacion Slates, PA-C  Primary Gastroenterologist:    No chief complaint on file.   HPI: Jamie [name] is a 50 y.o. female here   Normal esophagus. Dilated. - Normal stomach. - Normal duodenal bulb and second portion of the duodenum. - No specimens collected.   Tested positive for Covid 09/05/2020.  Current Outpatient Medications  Medication Sig Dispense Refill  . ACCU-CHEK AVIVA PLUS test strip     . Accu-Chek Softclix Lancets lancets AS DIRECTED TO CHECK BLOOD SUGAR ONCE TO TWICE DAILY    . albuterol (VENTOLIN HFA) 108 (90 Base) MCG/ACT inhaler INHALE TWO PUFFS BY MOUTH EVERY 6 HOURS AS NEEDED FOR WHEEZING OR SHORTNESS OF BREATH (Patient taking differently: Inhale 2 puffs into the lungs every 6 (six) hours as needed for wheezing or shortness of breath. ) 8.5 g 1  . ARIPiprazole (ABILIFY) 10 MG tablet Take 1 tablet (10 mg total) by mouth daily. 30 tablet 5  . buPROPion (WELLBUTRIN XL) 150 MG 24 hr tablet Take 3 tablets (450 mg total) by mouth daily. 90 tablet 5  . clonazePAM (KLONOPIN) 1 MG tablet Take 1-2 tablets (1-2 mg total) by mouth See admin instructions. Take 1 mg in the morning and 2 mg at night 90 tablet 4  . diclofenac Sodium (VOLTAREN) 1 % GEL Apply 1 application topically 4 (four) times daily as needed (pain).    [name] Kitchen dicyclomine (BENTYL) 10 MG capsule Take 1 capsule (10 mg total) by mouth 4 (four) times daily -  before meals and at bedtime. 120 capsule 3  . DULoxetine (CYMBALTA) 60 MG capsule Take 2 capsules (120 mg total) by mouth at bedtime. 60 capsule 4  . FIBER PO Take 1 capsule by mouth daily.     . fluticasone (FLONASE) 50 MCG/ACT nasal spray Place 1 spray into both nostrils at bedtime as needed for allergies or rhinitis.     [name] Kitchen gabapentin (NEURONTIN) 100 MG capsule Take 200 mg by mouth at bedtime.     [name] Kitchen lisinopril (PRINIVIL,ZESTRIL) 5 MG tablet Take 5 mg by mouth daily.    . metFORMIN (GLUCOPHAGE-XR) 500 MG 24 hr tablet Take 500 mg by  mouth daily.     . naproxen sodium (ALEVE) 220 MG tablet Take 440 mg by mouth 2 (two) times daily as needed (pain).     . ondansetron (ZOFRAN-ODT) 4 MG disintegrating tablet TAKE 1 TABLET BY MOUTH EVERY 8 HOURS AS NEEDED FOR NAUSEA AND VOMITING (Patient taking differently: Take 4 mg by mouth every 8 (eight) hours as needed for nausea or vomiting. ) 40 tablet 3  . rosuvastatin (CRESTOR) 20 MG tablet Take 10 mg by mouth daily.     . Semaglutide, 1 MG/DOSE, (OZEMPIC, 1 MG/DOSE,) 2 MG/1.5ML SOPN Inject 1 mg into the skin every Monday.    . topiramate (TOPAMAX) 50 MG tablet Take 1 tablet (50 mg total) by mouth 2 (two) times daily. 180 tablet 3  . traZODone (DESYREL) 100 MG tablet Take 4 tablets (400 mg total) by mouth at bedtime. 120 tablet 4   No current facility-administered medications for this visit.    Allergies as of 10/14/2020 - Review Complete 06/16/2020  Allergen Reaction Noted  . Codeine Hives and Swelling 06/07/2011  . Morphine and related Swelling and Rash 01/21/2014    ROS:  General: Negative for anorexia, weight loss, fever, chills, fatigue, weakness. ENT: Negative for hoarseness, difficulty swallowing , nasal congestion. CV: Negative for chest pain, angina, palpitations,  dyspnea on exertion, peripheral edema.  Respiratory: Negative for dyspnea at rest, dyspnea on exertion, cough, sputum, wheezing.  GI: See history of present illness. GU:  Negative for dysuria, hematuria, urinary incontinence, urinary frequency, nocturnal urination.  Endo: Negative for unusual weight change.    Physical Examination:   There were no vitals taken for this visit.  General: Well-nourished, well-developed in no acute distress.  Eyes: No icterus. Mouth: Oropharyngeal mucosa moist and pink , no lesions erythema or exudate. Lungs: Clear to auscultation bilaterally.  Heart: Regular rate and rhythm, no murmurs rubs or gallops.  Abdomen: Bowel sounds are normal, nontender, nondistended, no  hepatosplenomegaly or masses, no abdominal bruits or hernia , no rebound or guarding.   Extremities: No lower extremity edema. No clubbing or deformities. Neuro: Alert and oriented x 4   Skin: Warm and dry, no jaundice.   Psych: Alert and cooperative, normal mood and affect.  Labs:  ***  Imaging Studies: No results found.

## 2020-10-21 DIAGNOSIS — B370 Candidal stomatitis: Secondary | ICD-10-CM | POA: Diagnosis not present

## 2020-10-21 DIAGNOSIS — J029 Acute pharyngitis, unspecified: Secondary | ICD-10-CM | POA: Diagnosis not present

## 2020-10-22 DIAGNOSIS — Z7984 Long term (current) use of oral hypoglycemic drugs: Secondary | ICD-10-CM | POA: Diagnosis not present

## 2020-10-22 DIAGNOSIS — E7849 Other hyperlipidemia: Secondary | ICD-10-CM | POA: Diagnosis not present

## 2020-10-22 DIAGNOSIS — I10 Essential (primary) hypertension: Secondary | ICD-10-CM | POA: Diagnosis not present

## 2020-10-22 DIAGNOSIS — E119 Type 2 diabetes mellitus without complications: Secondary | ICD-10-CM | POA: Diagnosis not present

## 2020-10-30 ENCOUNTER — Encounter: Payer: Self-pay | Admitting: Neurology

## 2020-10-30 ENCOUNTER — Ambulatory Visit: Payer: Medicare Other | Admitting: Neurology

## 2020-10-30 VITALS — BP 124/84 | HR 104 | Ht 63.0 in | Wt 227.0 lb

## 2020-10-30 DIAGNOSIS — R071 Chest pain on breathing: Secondary | ICD-10-CM | POA: Diagnosis not present

## 2020-10-30 DIAGNOSIS — R0609 Other forms of dyspnea: Secondary | ICD-10-CM

## 2020-10-30 DIAGNOSIS — U099 Post covid-19 condition, unspecified: Secondary | ICD-10-CM

## 2020-10-30 DIAGNOSIS — Z9114 Patient's other noncompliance with medication regimen: Secondary | ICD-10-CM

## 2020-10-30 DIAGNOSIS — E661 Drug-induced obesity: Secondary | ICD-10-CM

## 2020-10-30 DIAGNOSIS — Z6841 Body Mass Index (BMI) 40.0 and over, adult: Secondary | ICD-10-CM

## 2020-10-30 DIAGNOSIS — G4719 Other hypersomnia: Secondary | ICD-10-CM

## 2020-10-30 NOTE — Progress Notes (Addendum)
[facility]   Provider:  Melvyn Novas, M D   Referring Provider:  Dr. Terrace Arabia , MD.  Primary Care Physician: Dr Neita Carp / Encarnacion Slates, PA-C  Chief Complaint  Patient presents with  . Follow-up    Pt alone, rm 10. Presents today as follow up. DME [employer]. She had covid in feb. She was not compliant prior to covid but post covid she indicated that its harder to breathe.    I was scheduled to have a revisit today with  Ms. Jamie [name]. Jamie [name], who was last seen in a virtual visit  2 years ago by Ihor Austin, by that time she had suboptimal compliance using the machine CPAP 8 out of 30 days 27%, there was no Epworth score obtained in that visit, Mallampati and neck size are not documented for that visit, the patient has received topiramate for headache management through Dr. Terrace Arabia and has discontinued to use topiramate-  No longer on migraine prevention. No longer having migraines.  She contracted COVID in February and was fairly sick, ( she had 2 moderna vaccines, no booster) hospitalized for 8 days at [facility]. Not ICU.   She had SEPSIS, bacteremia is documented, there was no room for her bed, she reports being several days in the ED-   Since then she has more difficulties breathing which would be a primary pulmonary concern, she is more often short of breath she has remained very hoarse and she has remained very congested.  She tells me that she was tested 4 times for Covid before the test returned positive and that she was treated for strep throat prior to that.  She transiently lost the smell of taste and smell but has recovered.  During that time everything tasted salty to her she was not appreciated of any kind of more nuanced taste or smells. She has had memory trouble since Covid, mistaking days and appointments, and location. She is walking  into a room and forgot what she came for.   Her CPAP compliance has dropped form 27 % 2 years ago to less than 10 %- I really don't think  this is a compliance concern related to COVID 19, she lost 33 pounds with he illness.  She remains morbidly obese. Has been seen by cardiology and pulmonology.  She is  willing to under go new testing to see if she needs BIPAP now.   See below for quoted results of last sleep test.    HPI:  Jamie [name] is a 50 y.o. female , seen here as a referral from Dr. Terrace Arabia and Jamie Baton, NP I had the pleasure of seeing Jamie [name] today following up on 10/18/2016 from a baseline polysomnogram from 06/28/2016. The patient presented for an in lab sleep study with chronic daily headaches, excessive daytime sleepiness, morbid obesity, observed apnea and snoring. She had endorsed the Epworth score at 13 points and was diagnosed with a mild dish apnea of only 9.6 per hour AHI. In supine sleep position there was a slight exacerbation to 13 apneas in our, during REM sleep to 14.4 per hour. It was the REM sleep distribution and 62 minutes of desaturation time that make me recommend CPAP therapy for the patient. She returned for an attended titration on 07/22/2016 and was titrated to 7 cm water. Besides reviewing the 2 sleep studies with her I can also today discussed her first compliance download. She has used the machine every day but she often fails to  use it for 4 hours or longer. Overall she feels better than she uses CPAP and her sleepiness and daytime has decreased. Her average user time is only 4 hours and 45 minutes for the last 30 days she still has a lot of obstructive apneas and HEENT the seems to be artificially related to high air leaks. My goal is today to review the mask fit and to place her on an auto titrate her. Her machine should be able to provide this service for her I will ask her to use the machine every day for at least 4 hours at night.      Last visit note , CD  Jamie [name] has been established patient of Dr. Zannie Cove whom she sees yearly for follow-up. She has a history of chronic headaches which  are only fairly controlled. Usually 5-6 times a month she wakes up with a headache. She also has daytime excessive sleepiness endorsing the Epworth score at 13 points, she is currently treated with Topamax 50 mg twice a day and she is treated for bipolar disorder by Dr. Donell Beers,  further carries a diagnosis of hypertension, anxiety, hyperlipidemia, morbid obesity, chronic lower back pain, status post lumbar laminectomy in June 2016 performed by Dr. Hilda Lias. She has a history of polycystic ovarian disease. Recent MRIs and MR venogram of the brain have been normal she saw [facility] Citizen last on 05/04/2016 and received refills for her medications. She is here today to be evaluated for possible sleep apnea-insomnia.  She has primary insomnia having trouble to go to sleep as well as having trouble staying asleep. This is a chronic insomnia it has been present for longer than 10 years, and has been associated with her bipolar disorder. Dr. Olevia Bowens is treating her for it. She has been prescribed trazodone helps with sleep initiation but it doesn't help as well with sleep maintenance. She is also treated with Geodon, Topamax, Prilosec, Crestor, metformin, Cymbalta, Klonopin, Wellbutrin, Tylenol, and Prinivil. Sleep habits are as follows: She goes to bed as early as 7:30 PM and is usually asleep within 2 hours after taking trazodone. She usually watches TV before going to the bedroom. Her bedroom as core, quiet and dark, she sleeps on her sides, she is using one pillow for head support. She does not complain of heartburn or acid reflux, since taking medicine for it. She wakes up at night twice to go to the bathroom, sometimes she cannot go back to sleep. She does not wake up from pain or headaches, but she also wakes up with a feeling of shortness of breath , breath holding spells. She rises in the morning at about 7 AM her dog usually wakes her at that time. She does have headaches and a dry mouth usually in the  morning at that time. Usually she has to take medication for the headache and the headaches will resolve as the day goes on. Usually she feels energized, refreshed and restored in the mornings. She was told by friends and family that she snores loudly, that she talks in her sleep, sometimes stops to breath - she is not known to act out or is restless in her sleep,she has never slept walked. Sleep medical history and family sleep history: Her father suffered from sleep apnea, Mrs. Simya Tercero was evaluated over 6 years ago in a sleep study - referred by Dr. Terrace Arabia on 03/19/2010. At the time she had a BMI of 43, Becks inventory endorsed at 22 points, and slept for  90% of the recorded time. There was no significant apnea noted the overall AHI was 0.1 and the oxygen nadir 92, there were no PLMS noted. No follow up. Insomnia was not verified.  Social history: single, living with her widowed mother, the patient is on disability. 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 She does not use tobacco products, does not use are cool, she does not drink caffeine in any form. He does not eat chocolates.  Jamie [name] referral note :  50 y.o. year old female followed by Dr Terrace Arabia for migraines.  has a past medical history of Hypertension; High cholesterol; Bipolar 1 disorder (HCC); Depression; Anxiety; Headache(784.0); PCOS (polycystic ovarian syndrome); here to follow up. Her headaches are in fair control  on Topamax. She has a new complaint of morning headaches and daytime somnolence. ESS 12, BMI 40.09  PLAN:Continue Topamax at current dose will refill for one year Will obtain sleep study  I explained in particular the risks and ramifications of untreated moderate to severe OSA, especially with respect to cardiovascular disease  including congestive heart failure, difficult to treat hypertension, cardiac arrhythmias, or stroke. Even type 2 diabetes has, in part, been linked to untreated OSA. Symptoms of untreated  OSA include daytime sleepiness, memory problems, mood irritability and mood disorder such as depression and anxiety, lack of energy, as well as recurrent headaches, especially morning headaches. We talked about trying to maintain a healthy lifestyle in general, as well as the importance of weight control. She was congratulated for her 30 pound weight loss I encouraged the patient to eat healthy, exercise daily and keep well hydrated, to keep a scheduled bedtime and wake time routine, to not skip any meals and eat healthy snacks in between meals   Review of Systems: Out of a complete 14 system review, the patient complains of only the following symptoms, and all other reviewed systems are negative. She still reports some nocturia ( nocturia now 1-2) ,   How likely are you to doze in the following situations: 0 = not likely, 1 = slight chance, 2 = moderate chance, 3 = high chance  Sitting and Reading? Watching Television? Sitting inactive in a public place (theater or meeting)? Lying down in the afternoon when circumstances permit? Sitting and talking to someone? Sitting quietly after lunch without alcohol? In a car, while stopped for a few minutes in traffic? As a passenger in a car for an hour without a break?  Total =17  FSS at 38.   GDS; on treatment but endorsed 9 out of 15 points, bipolar depression and anxiety .      Social History   Socioeconomic History  . Marital status: Married    Spouse name: Not on file  . Number of children: 0  . Years of education: college  . Highest education level: Not on file  Occupational History  . Occupation: disabled  Tobacco Use  . Smoking status: Never Smoker  . Smokeless tobacco: Never Used  Vaping Use  . Vaping Use: Never used  Substance and Sexual Activity  . Alcohol use: No  . Drug use: Never  . Sexual activity: Yes    Partners: Male    Birth control/protection: None  Other Topics Concern  . Not on file  Social History  Narrative   Patient lives at home alone    Patient is right handed   Patient drinks soda's daily   Social Determinants of Health   Financial Resource Strain: Not  on file  Food Insecurity: Not on file  Transportation Needs: Not on file  Physical Activity: Not on file  Stress: Not on file  Social Connections: Not on file  Intimate Partner Violence: Not on file    Family History  Problem Relation Age of Onset  . COPD Father   . Anxiety disorder Maternal Grandmother   . Depression Maternal Grandmother   . Colon cancer Neg Hx   . Colon polyps Neg Hx     Past Medical History:  Diagnosis Date  . Anxiety   . Arthritis    knees  . Bipolar 1 disorder (HCC)   . Chronic diarrhea   . Diverticulosis of colon   . GERD (gastroesophageal reflux disease)   . Hiatal hernia   . History of concussion    per pt 11/ 2020 w/ no residual  . Hyperlipidemia   . Hypertension    followed by pcp   (11-29-2019  per pt stated never had stress test)  . MDD (major depressive disorder)   . Migraine    neurologist--- dr Terrace Arabiayan  . OSA on CPAP    followed by dr Makhi Muzquiz  . PCOS (polycystic ovarian syndrome)   . PONV (postoperative nausea and vomiting)   . Type 2 diabetes mellitus (HCC)    followed by pcp   (11-29-2019  pt stated checks blood sugar daily in am,  fasting sugar--- 96    Past Surgical History:  Procedure Laterality Date  . ANAL FISSURE REPAIR  x3  last one  2007  approx.  [name] Kitchen. BIOPSY  09/06/2019   Procedure: BIOPSY;  Surgeon: Corbin Adeourk, Robert M, MD;  Location: [facility];  Service: Endoscopy;;  . COLONOSCOPY WITH PROPOFOL [name] 09/06/2019   pancolonic diverticulosis. Segmental biopsy completed with benign biopsies.   . ESOPHAGOGASTRODUODENOSCOPY (EGD) WITH PROPOFOL [name] 06/16/2020   Dr. Jena Gaussourk: normal esophagus s/p dilation, normal stomach, duodenum.   . EVALUATION UNDER ANESTHESIA WITH HEMORRHOIDECTOMY [name] 12/05/2019   Procedure: ANORECTAL EXAM UNDER ANESTHESIA WITH HEMORRHOIDECTOMY,  HEMORRHOIDAL LIGATION/PEXY;  Surgeon: Karie SodaGross, Steven, MD;  Location: [facility];  Service: General;  Laterality: [name];  GENERAL AND LOCAL  . EVALUATION UNDER ANESTHESIA WITH TEAR DUCT PROBING  infant  . KNEE ARTHROSCOPY Right 1994  . LAPAROSCOPIC CHOLECYSTECTOMY  08-06-2002  @[facility]   . LUMBAR LAMINECTOMY/DECOMPRESSION MICRODISCECTOMY Right 01/22/2014   Procedure: LUMBAR FOUR TO FIVE LUMBAR LAMINECTOMY/DECOMPRESSION MICRODISCECTOMY 1 LEVEL;  Surgeon: Karn CassisErnesto M Botero, MD;  Location: [facility];  Service: Neurosurgery;  Laterality: Right;  Right L45 diskectomy  . MALONEY DILATION [name] 06/16/2020   Procedure: Elease HashimotoMALONEY DILATION;  Surgeon: Corbin Adeourk, Robert M, MD;  Location: [facility];  Service: Endoscopy;  Laterality: [name];    Current Outpatient Medications  Medication Sig Dispense Refill  . ACCU-CHEK AVIVA PLUS test strip     . Accu-Chek Softclix Lancets lancets AS DIRECTED TO CHECK BLOOD SUGAR ONCE TO TWICE DAILY    . albuterol (VENTOLIN HFA) 108 (90 Base) MCG/ACT inhaler INHALE TWO PUFFS BY MOUTH EVERY 6 HOURS AS NEEDED FOR WHEEZING OR SHORTNESS OF BREATH (Patient taking differently: Inhale 2 puffs into the lungs every 6 (six) hours as needed for wheezing or shortness of breath.) 8.5 g 1  . ARIPiprazole (ABILIFY) 10 MG tablet Take 1 tablet (10 mg total) by mouth daily. 30 tablet 5  . buPROPion (WELLBUTRIN XL) 150 MG 24 hr tablet Take 3 tablets (450 mg total) by mouth daily. 90 tablet 5  . clonazePAM (KLONOPIN) 1 MG tablet Take  1-2 tablets (1-2 mg total) by mouth See admin instructions. Take 1 mg in the morning and 2 mg at night 90 tablet 4  . diclofenac Sodium (VOLTAREN) 1 % GEL Apply 1 application topically 4 (four) times daily as needed (pain).    [name] Kitchen dicyclomine (BENTYL) 10 MG capsule Take 1 capsule (10 mg total) by mouth 4 (four) times daily -  before meals and at bedtime. 120 capsule 3  . DULoxetine (CYMBALTA) 60 MG capsule Take 2 capsules (120 mg total) by mouth at bedtime. 60 capsule  4  . FIBER PO Take 1 capsule by mouth daily.     . fluticasone (FLONASE) 50 MCG/ACT nasal spray Place 1 spray into both nostrils at bedtime as needed for allergies or rhinitis.     [name] Kitchen gabapentin (NEURONTIN) 100 MG capsule Take 200 mg by mouth at bedtime.     [name] Kitchen lisinopril (PRINIVIL,ZESTRIL) 5 MG tablet Take 5 mg by mouth daily.    . metFORMIN (GLUCOPHAGE-XR) 500 MG 24 hr tablet Take 500 mg by mouth daily.     . naproxen sodium (ALEVE) 220 MG tablet Take 440 mg by mouth 2 (two) times daily as needed (pain).     . ondansetron (ZOFRAN-ODT) 4 MG disintegrating tablet TAKE 1 TABLET BY MOUTH EVERY 8 HOURS AS NEEDED FOR NAUSEA AND VOMITING (Patient taking differently: Take 4 mg by mouth every 8 (eight) hours as needed for nausea or vomiting.) 40 tablet 3  . rosuvastatin (CRESTOR) 20 MG tablet Take 10 mg by mouth daily.    . Semaglutide, 1 MG/DOSE, (OZEMPIC, 1 MG/DOSE,) 2 MG/1.5ML SOPN Inject 1 mg into the skin every Monday.    . traZODone (DESYREL) 100 MG tablet Take 4 tablets (400 mg total) by mouth at bedtime. 120 tablet 4   No current facility-administered medications for this visit.    Allergies as of 10/30/2020 - Review Complete 10/30/2020  Allergen Reaction Noted  . Codeine Hives and Swelling 06/07/2011  . Morphine and related Swelling and Rash 01/21/2014    Vitals: BP 124/84   Pulse (!) 104   Ht 5\' 3"  (1.6 m)   Wt 227 lb (103 kg)   BMI 40.21 kg/m  Last Weight:  Wt Readings from Last 1 Encounters:  10/30/20 227 lb (103 kg)   12/30/20 mass index is 40.21 kg/m.     Last Height:   Ht Readings from Last 1 Encounters:  10/30/20 5\' 3"  (1.6 m)    Physical exam:  General: The patient is awake, alert and appears not in acute distress. The patient is well groomed. Head: Normocephalic, atraumatic. Neck is supple. Mallampati 5, uvula not visible. neck circumference: 19 inches . Nasal airflow congested , patient has rhinophyma. Crowded dental status, never worn braces.  Trunk: BMI is 38  now.   ankle edema, morbidly obese, abdominal girth.   Chest pain with deep breathing.   Neurologic exam :  Mood and affect are depressed, dysphonia is present.  .  Cranial nerves: transient loss of smell and taste - not tested today   Pupils are equal and briskly reactive to light.  Visual fields by finger perimetry are intact. Hearing to finger rub intact.  Facial sensation intact to fine touch.Facial motor strength is symmetric and tongue and uvula move midline. Shoulder shrug was symmetrical.   DTR are attenuated.     Mrs. Olshefski had not been seen in over 4 years by me personally and I was unaware that she hadnt been dismissed after her years  of non-compliance with CPAP.   The patient was advised of the nature of the diagnosed sleep disorder , the treatment options and risks for general a health and wellness arising from not treating the condition.  I spent more than 40 minutes of face to face time with the patient. Greater than 50% of time was spent in counseling and coordination of care. We have discussed the diagnosis and differential and I answered the patient's questions.     Assessment:  After physical and neurologic examination, review of laboratory studies,  Personal review of imaging studies, reports of other /same  Imaging studies ,  Results of polysomnography/ neurophysiology testing and pre-existing records as far as provided in visit., my assessment is :  CPAP is over 5 years ld and she cannot tolerate CPAP at this time, but has a long history of non compliance.   1)  OSA ;  AutoSet treating 5 and 10 cm water. This could have drastically changed after COVID 19, and SEPSIS< she may have hypoxemia now. I will order a PSG for screening nof  her current apnea and co-morbidities.  I certainly would want her to have an attended titration to follow , as I suspect she will benefit from BiPAP given her chest pain.  She is too heavy for INSPIRE, but I encouraged her to continue  weight loss , current BMI is over 40.    My goal for Mrs. Robards is to undergo either PSG or home sleep test device testing to establish her current degree of apnea, rule out central apnea, and to establish if hypoxemia at night is present.  As to CPAP treatment currently it is not successful and her compliance is 10% 3 out of 30 days over 4 hours the average use at time was 1 hour 12 minutes.    Minimum pressure was 5 cmH2O maximum pressure 10 cmH2O with 3 cm EPR her 95th percentile pressure was 8.2 which is definitely speaking that she needs an increase in her maximum pressure settings residual AHI was 9 was 6.9/h and follow with obstructive so this is too much.  I think the key here is the air leak at the 95th percentile 23 L/min.  She has been sent many times before for refitting of masks and her current mask has been as good as any before.  If as we have established that he still has apnea I need her to come in for an attended sleep study auto titration will not work for this patient, I need to see if she needs BiPAP and if she tolerates BiPAP better and the option for this is only given if we can see her sleep on positive airway pressure.  We will follow-up at 90-120 days with the nurse practitioner to assure compliance on the new settings.  I will offer her an alternative interface as she has very high air leaks.   DME, namely  [facility]   RV yearly with NP.   Porfirio Mylar Lennis Korb MD  10/30/2020   CC: Dr Terrace Arabia  Dr Donell Beers Dr Neita Carp

## 2020-10-30 NOTE — Patient Instructions (Signed)
My goal for Jamie [name] is to undergo either PSG or home sleep test device testing to establish her current degree of apnea, rule out central apnea, and to establish if hypoxemia at night is present.  As to CPAP treatment currently it is not successful and her compliance is 10% 3 out of 30 days over 4 hours the average use at time was 1 hour 12 minutes.    Minimum pressure was 5 cmH2O maximum pressure 10 cmH2O with 3 cm EPR her 95th percentile pressure was 8.2 which is definitely speaking that she needs an increase in her maximum pressure settings residual AHI was 9 was 6.9/h and follow with obstructive so this is too much.  I think the key here is the air leak at the 95th percentile 23 L/min.  She has been sent many times before for refitting of masks and her current mask has been as good as any before.  If as we have established that he still has apnea I need her to come in for an attended sleep study auto titration will not work for this patient, I need to see if she needs BiPAP and if she tolerates BiPAP better and the option for this is only given if we can see her sleep on positive airway pressure.

## 2020-11-03 DIAGNOSIS — S39012A Strain of muscle, fascia and tendon of lower back, initial encounter: Secondary | ICD-10-CM | POA: Diagnosis not present

## 2020-11-03 DIAGNOSIS — E119 Type 2 diabetes mellitus without complications: Secondary | ICD-10-CM | POA: Diagnosis not present

## 2020-11-03 DIAGNOSIS — E1143 Type 2 diabetes mellitus with diabetic autonomic (poly)neuropathy: Secondary | ICD-10-CM | POA: Diagnosis not present

## 2020-11-03 DIAGNOSIS — I10 Essential (primary) hypertension: Secondary | ICD-10-CM | POA: Diagnosis not present

## 2020-11-03 DIAGNOSIS — E7849 Other hyperlipidemia: Secondary | ICD-10-CM | POA: Diagnosis not present

## 2020-11-03 DIAGNOSIS — K219 Gastro-esophageal reflux disease without esophagitis: Secondary | ICD-10-CM | POA: Diagnosis not present

## 2020-11-06 DIAGNOSIS — B370 Candidal stomatitis: Secondary | ICD-10-CM | POA: Diagnosis not present

## 2020-11-06 DIAGNOSIS — K117 Disturbances of salivary secretion: Secondary | ICD-10-CM | POA: Insufficient documentation

## 2020-11-06 DIAGNOSIS — G4733 Obstructive sleep apnea (adult) (pediatric): Secondary | ICD-10-CM | POA: Diagnosis not present

## 2020-11-06 DIAGNOSIS — J3489 Other specified disorders of nose and nasal sinuses: Secondary | ICD-10-CM | POA: Diagnosis not present

## 2020-11-19 ENCOUNTER — Ambulatory Visit (INDEPENDENT_AMBULATORY_CARE_PROVIDER_SITE_OTHER): Payer: Medicare Other | Admitting: Neurology

## 2020-11-19 DIAGNOSIS — R0609 Other forms of dyspnea: Secondary | ICD-10-CM

## 2020-11-19 DIAGNOSIS — G4733 Obstructive sleep apnea (adult) (pediatric): Secondary | ICD-10-CM

## 2020-11-19 DIAGNOSIS — U099 Post covid-19 condition, unspecified: Secondary | ICD-10-CM

## 2020-11-19 DIAGNOSIS — Z9114 Patient's other noncompliance with medication regimen: Secondary | ICD-10-CM

## 2020-11-19 DIAGNOSIS — R071 Chest pain on breathing: Secondary | ICD-10-CM

## 2020-11-19 DIAGNOSIS — G4719 Other hypersomnia: Secondary | ICD-10-CM

## 2020-11-19 DIAGNOSIS — E66813 Obesity, class 3: Secondary | ICD-10-CM

## 2020-11-19 DIAGNOSIS — E661 Drug-induced obesity: Secondary | ICD-10-CM

## 2020-11-19 DIAGNOSIS — Z91199 Patient's noncompliance with other medical treatment and regimen due to unspecified reason: Secondary | ICD-10-CM

## 2020-11-19 DIAGNOSIS — Z6841 Body Mass Index (BMI) 40.0 and over, adult: Secondary | ICD-10-CM

## 2020-11-20 NOTE — Progress Notes (Signed)
[facility]  HOME SLEEP TEST (Watch PAT)  STUDY DATE: 11/19/20  DOB: [birth year]  MRN: [id number]  ORDERING CLINICIAN: Huston Foley, MD, PhD   REFERRING CLINICIAN: Winn Jock  , Terrace Arabia ,MD  CLINICAL INFORMATION/HISTORY: Ms. Jamie Hartley. Zabawa, who was last seen in a virtual visit  2 years ago by Ihor Austin, by that time she had suboptimal compliance using the machine CPAP 8 out of 30 days 27%, there was no Epworth score obtained in that visit, Mallampati and neck size are not documented for that visit, the patient has received topiramate for headache management through Dr. Terrace Arabia and has discontinued to use topiramate-  No longer on migraine prevention. No longer having migraines.  She contracted COVID in February and fell very ill ( she had 2 moderna vaccines, no booster) hospitalized for 8 days at [facility]. Not ICU. She had SEPSIS, bacteremia is documented, there was no room for her bed, she reports being several days in the ED-   Since then she has more difficulties breathing which would be a primary pulmonary concern, she is more often short of breath, she has remained very hoarse, and she has remained very congested.  She tells me that she was tested 4 times for Covid before the test returned positive and that she was treated for strep throat prior to that.   She transiently lost the smell of taste and smell but has recovered. During that time everything tasted salty to her she was not appreciated of any kind of more nuanced taste or smells. She has had memory trouble since Covid, mistaking days and appointments, and location. She is walking  into a room and forgot what she came for.   Epworth sleepiness score: 17/24.  BMI: 40.2 kg/m  Neck Circumference: 19 "  FINDINGS:   Total Record Time (hours, min): 9 h 12 min  Total Sleep Time (hours, min):  7 h 51 min   Percent REM (%):    28.63 %   Calculated pAHI (per hour): 6.0       REM pAHI: 8.4    NREM pAHI:  5.0 Supine AHI: 5.2   Oxygen Saturation (%) Mean: 92  Minimum oxygen saturation (%):        76   O2 Saturation Range (%): 76-96  O2Saturation (minutes) <=88%: 0.1 min   Pulse Mean (bpm):    95  Pulse Range (75-111)   IMPRESSION: This HST did only find very mild OSA (obstructive sleep apnea) to be present and indicated no sustained hypoxia. At baseline, there is some tachycardia. There was snoring recorded.    RECOMMENDATION: I would like to offer CPAP as a try-out. The patient has many other options to treat such mild apnea ( if she chooses to) but in order to help her with deeper breathing, a PAP therapy would be an option. I would also recommend to pursue weight loss under medical supervision.    INTERPRETING PHYSICIAN:  Melvyn Novas, MD    [facility] and [facility] Sleep Board certified by The [employer] of Sleep Medicine and Diplomate of the [employer] of Sleep Medicine. Board certified In Neurology through the ABPN, Fellow of the [employer] of Neurology. Medical Director of [employer].   Sleep Summary  Oxygen Saturation Statistics   Start Study Time: End Study Time: Total Recording Time:      10:17:33 PM 7:30:07 AM 9 h, 12 min  Total Sleep Time % REM of Sleep Time:  7 h, 51 min  28.6    Mean: 92 Minimum: 76 Maximum: 96  Mean of Desaturations Nadirs (%):   90  Oxygen Desatur. %: 4-9 10-20 >20 Total  Events Number Total  14 100.0  0 0.0  0 0.0  14 100.0  Oxygen Saturation: <90 <=88 <85 <80 <70  Duration (minutes): Sleep % 0.3 0.1 0.1 0.0 0.0 0.0 0.0 0.0 0.0 0.0     Respiratory Indices      Total Events REM NREM All Night  pRDI: pAHI 3%: ODI 4%: pAHIc 3%: % CSR: pAHI 4%:  46  46  14  1 0.0 12 8.4 8.4 2.8 0.5 5.0 5.0 1.4 0.0 6.0 6.0 1.8 0.1 1.6       Pulse Rate Statistics during Sleep (BPM)      Mean: 95 Minimum: 45 Maximum: 111        Body Position Statistics  Position Supine Prone Right  Left Non-Supine  Sleep (min) 290.0 5.5 144.1 31.0 180.6  Sleep % 61.5 1.2 30.6 6.6 38.3  pRDI 5.2 [name] 6.0 8.3 7.2  pAHI 3% 5.2 [name] 6.0 8.3 7.2  ODI 4% 1.3 [name] 2.1 6.2 2.7     Snoring Statistics Snoring Level (dB) >40 >50 >60 >70 >80 >Threshold (45)  Sleep (min) 423.8 19.5 11.1 0.0 0.0 31.1  Sleep % 89.9 4.1 2.4 0.0 0.0 6.6    Mean: 42 dB

## 2020-11-21 NOTE — Progress Notes (Signed)
:   This HST did only find very mild OSA (obstructive sleep apnea) to be present and indicated no sustained hypoxia. At baseline, there is some tachycardia. There was snoring recorded.    RECOMMENDATION: I would like to offer CPAP as a try-out. The patient has many other options to treat such mild apnea ( if she chooses to) but in order to help her with deeper breathing, a PAP therapy would be an option. I would also recommend to pursue weight loss under medical supervision.    INTERPRETING PHYSICIAN:  Melvyn Novas, MD

## 2020-11-21 NOTE — Procedures (Signed)
[facility]  HOME SLEEP TEST (Watch PAT)  STUDY DATE: 11/19/20  DOB: 1971-06-10  MRN: [id number]  ORDERING CLINICIAN: Huston Foley, MD, PhD   REFERRING CLINICIAN: Winn Jock  , Terrace Arabia ,MD  CLINICAL INFORMATION/HISTORY: Ms. Jamie Kaczmarek. [name], who was last seen in a virtual visit  2 years ago by Ihor Austin, by that time she had suboptimal compliance using the machine CPAP 8 out of 30 days 27%, there was no Epworth score obtained in that visit, Mallampati and neck size are not documented for that visit, the patient has received topiramate for headache management through Dr. Terrace Arabia and has discontinued to use topiramate-  No longer on migraine prevention. No longer having migraines.  She contracted COVID in February and fell very ill ( she had 2 moderna vaccines, no booster) hospitalized for 8 days at [facility]. Not ICU. She had SEPSIS, bacteremia is documented, there was no room for her bed, she reports being several days in the ED-   Since then she has more difficulties breathing which would be a primary pulmonary concern, she is more often short of breath, she has remained very hoarse, and she has remained very congested.  She tells me that she was tested 4 times for Covid before the test returned positive and that she was treated for strep throat prior to that.   She transiently lost the smell of taste and smell but has recovered. During that time everything tasted salty to her she was not appreciated of any kind of more nuanced taste or smells. She has had memory trouble since Covid, mistaking days and appointments, and location. She is walking  into a room and forgot what she came for.   Epworth sleepiness score: 17/24.  BMI: 40.2 kg/m  Neck Circumference: 19 "  FINDINGS:   Total Record Time (hours, min): 9 h 12 min  Total Sleep Time (hours, min):  7 h 51 min   Percent REM (%):    28.63 %   Calculated pAHI (per hour): 6.0       REM pAHI: 8.4    NREM pAHI: 5.0 Supine  AHI: 5.2   Oxygen Saturation (%) Mean: 92  Minimum oxygen saturation (%):        76   O2 Saturation Range (%): 76-96  O2Saturation (minutes) <=88%: 0.1 min   Pulse Mean (bpm):    95  Pulse Range (75-111)   IMPRESSION: This HST did only find very mild OSA (obstructive sleep apnea) to be present and indicated no sustained hypoxia. At baseline, there is some tachycardia. There was snoring recorded.    RECOMMENDATION: I would like to offer CPAP as a try-out. The patient has many other options to treat such mild apnea ( if she chooses to) but in order to help her with deeper breathing, a PAP therapy would be an option. I would also recommend to pursue weight loss under medical supervision.    INTERPRETING PHYSICIAN:  Melvyn Novas, MD    [facility] and [facility] Sleep Board certified by The [employer] of Sleep Medicine and Diplomate of the [employer] of Sleep Medicine. Board certified In Neurology through the ABPN, Fellow of the [employer] of Neurology. Medical Director of [employer].   Sleep Summary  Oxygen Saturation Statistics   Start Study Time: End Study Time: Total Recording Time:      10:17:33 PM 7:30:07 AM 9 h, 12 min  Total Sleep Time % REM of Sleep Time:  7  h, 51 min  28.6    Mean: 92 Minimum: 76 Maximum: 96  Mean of Desaturations Nadirs (%):   90  Oxygen Desatur. %: 4-9 10-20 >20 Total  Events Number Total  14 100.0  0 0.0  0 0.0  14 100.0  Oxygen Saturation: <90 <=88 <85 <80 <70  Duration (minutes): Sleep % 0.3 0.1 0.1 0.0 0.0 0.0 0.0 0.0 0.0 0.0     Respiratory Indices      Total Events REM NREM All Night  pRDI: pAHI 3%: ODI 4%: pAHIc 3%: % CSR: pAHI 4%:  46  46  14  1 0.0 12 8.4 8.4 2.8 0.5 5.0 5.0 1.4 0.0 6.0 6.0 1.8 0.1 1.6       Pulse Rate Statistics during Sleep (BPM)      Mean: 95 Minimum: 45 Maximum: 111        Body Position Statistics  Position Supine Prone Right Left  Non-Supine  Sleep (min) 290.0 5.5 144.1 31.0 180.6  Sleep % 61.5 1.2 30.6 6.6 38.3  pRDI 5.2 [name] 6.0 8.3 7.2  pAHI 3% 5.2 [name] 6.0 8.3 7.2  ODI 4% 1.3 [name] 2.1 6.2 2.7     Snoring Statistics Snoring Level (dB) >40 >50 >60 >70 >80 >Threshold (45)  Sleep (min) 423.8 19.5 11.1 0.0 0.0 31.1  Sleep % 89.9 4.1 2.4 0.0 0.0 6.6

## 2020-11-21 NOTE — Addendum Note (Signed)
Addended by: Melvyn Novas on: 11/21/2020 10:53 AM   Modules accepted: Orders

## 2020-11-22 DIAGNOSIS — E7849 Other hyperlipidemia: Secondary | ICD-10-CM | POA: Diagnosis not present

## 2020-11-22 DIAGNOSIS — I10 Essential (primary) hypertension: Secondary | ICD-10-CM | POA: Diagnosis not present

## 2020-11-22 DIAGNOSIS — Z7984 Long term (current) use of oral hypoglycemic drugs: Secondary | ICD-10-CM | POA: Diagnosis not present

## 2020-11-22 DIAGNOSIS — E119 Type 2 diabetes mellitus without complications: Secondary | ICD-10-CM | POA: Diagnosis not present

## 2020-11-25 ENCOUNTER — Other Ambulatory Visit: Payer: Self-pay

## 2020-11-25 ENCOUNTER — Telehealth (INDEPENDENT_AMBULATORY_CARE_PROVIDER_SITE_OTHER): Payer: Medicare Other | Admitting: Psychiatry

## 2020-11-25 DIAGNOSIS — F323 Major depressive disorder, single episode, severe with psychotic features: Secondary | ICD-10-CM | POA: Diagnosis not present

## 2020-11-25 DIAGNOSIS — F325 Major depressive disorder, single episode, in full remission: Secondary | ICD-10-CM

## 2020-11-25 MED ORDER — DULOXETINE HCL 60 MG PO CPEP: 120.0000 mg | ORAL_CAPSULE | Freq: Every day | ORAL | 4 refills | Status: DC

## 2020-11-25 MED ORDER — BUPROPION HCL ER (XL) 150 MG PO TB24: 450.0000 mg | ORAL_TABLET | Freq: Every day | ORAL | 5 refills | Status: DC

## 2020-11-25 MED ORDER — TRAZODONE HCL 100 MG PO TABS: 400.0000 mg | ORAL_TABLET | Freq: Every day | ORAL | 4 refills | Status: DC

## 2020-11-25 MED ORDER — CLONAZEPAM 1 MG PO TABS: 1.0000 mg | ORAL_TABLET | ORAL | 4 refills | Status: DC

## 2020-11-25 MED ORDER — ARIPIPRAZOLE 10 MG PO TABS: 10.0000 mg | ORAL_TABLET | Freq: Every day | ORAL | 5 refills | Status: DC

## 2020-11-25 NOTE — Progress Notes (Signed)
Patient ID: Jamie [name], female   DOB: Jul 03, 1971, 50 y.o.   MRN: [id number] [facility] MD Progress Note  11/25/2020 3:36 PM WILLMA OBANDO  MRN:  [id number] Subjective:  Feeling well. Principal Problem: Major depression recurrent  Today the patient is actually doing pretty well.  She still has some residual respiratory symptoms that are bothering her.  But overall she is not sure that she has no fever and she has no signs of significant infection.  She denies daily depression.  She is sleeping and eating pretty well.  She says she recently had another sleep sleep study and the results are pending.  [facility]  Patient's anxiety is well controlled.  She has not added.  Her energy level is reasonably good.  She denies any chest pain.  She is functioning actually pretty well.  She is certainly not suicidal.  Her husband is out of town for the time being but she seems to be tolerating it pretty well.  She denies any use of alcohol or drugs. Past Medical History:  Diagnosis Date  . Anxiety   . Arthritis    knees  . Bipolar 1 disorder (HCC)   . Chronic diarrhea   . Diverticulosis of colon   . GERD (gastroesophageal reflux disease)   . Hiatal hernia   . History of concussion    per pt 11/ 2020 w/ no residual  . Hyperlipidemia   . Hypertension    followed by pcp   (11-29-2019  per pt stated never had stress test)  . MDD (major depressive disorder)   . Migraine    neurologist--- dr Terrace Arabia  . OSA on CPAP    followed by dr dohmeier  . PCOS (polycystic ovarian syndrome)   . PONV (postoperative nausea and vomiting)   . Type 2 diabetes mellitus (HCC)    followed by pcp   (11-29-2019  pt stated checks blood sugar daily in am,  fasting sugar--- 96    Past Surgical History:  Procedure Laterality Date  . ANAL FISSURE REPAIR  x3  last one  2007  approx.  [name] Kitchen BIOPSY  09/06/2019   Procedure: BIOPSY;  Surgeon: [name], MD;  Location: [facility];  Service:  Endoscopy;;  . COLONOSCOPY WITH PROPOFOL [name] 09/06/2019   pancolonic diverticulosis. Segmental biopsy completed with benign biopsies.   . ESOPHAGOGASTRODUODENOSCOPY (EGD) WITH PROPOFOL [name] 06/16/2020   Dr. [name]: normal esophagus s/p dilation, normal stomach, duodenum.   . EVALUATION UNDER ANESTHESIA WITH HEMORRHOIDECTOMY [name] 12/05/2019   Procedure: ANORECTAL EXAM UNDER ANESTHESIA WITH HEMORRHOIDECTOMY, HEMORRHOIDAL LIGATION/PEXY;  Surgeon: [name], MD;  Location: [facility];  Service: General;  Laterality: [name];  GENERAL AND LOCAL  . EVALUATION UNDER ANESTHESIA WITH TEAR DUCT PROBING  infant  . KNEE ARTHROSCOPY Right 1994  . LAPAROSCOPIC CHOLECYSTECTOMY  08-06-2002  @[facility]   . LUMBAR LAMINECTOMY/DECOMPRESSION MICRODISCECTOMY Right 01/22/2014   Procedure: LUMBAR FOUR TO FIVE LUMBAR LAMINECTOMY/DECOMPRESSION MICRODISCECTOMY 1 LEVEL;  Surgeon: 01/24/2014, MD;  Location: [facility];  Service: Neurosurgery;  Laterality: Right;  Right L45 diskectomy  . MALONEY DILATION [name] 06/16/2020   Procedure: 06/18/2020 DILATION;  Surgeon: Elease [name], MD;  Location: [facility];  Service: Endoscopy;  Laterality: [name];   Family History:  Family History  Problem Relation Age of Onset  . COPD Father   . Anxiety disorder Maternal Grandmother   . Depression Maternal Grandmother   . Colon cancer Neg Hx   .  Colon polyps Neg Hx    Family Psychiatric  History:  Social History:  Social History   Substance and Sexual Activity  Alcohol Use No     Social History   Substance and Sexual Activity  Drug Use Never    Social History   Socioeconomic History  . Marital status: Married    Spouse name: Not on file  . Number of children: 0  . Years of education: college  . Highest education level: Not on file  Occupational History  . Occupation: disabled  Tobacco Use  . Smoking status: Never Smoker  . Smokeless tobacco: Never Used  Vaping Use  . Vaping Use: Never used  Substance and  Sexual Activity  . Alcohol use: No  . Drug use: Never  . Sexual activity: Yes    Partners: Male    Birth control/protection: None  Other Topics Concern  . Not on file  Social History Narrative   Patient lives at home alone    Patient is right handed   Patient drinks soda's daily   Social Determinants of Health   Financial Resource Strain: Not on file  Food Insecurity: Not on file  Transportation Needs: Not on file  Physical Activity: Not on file  Stress: Not on file  Social Connections: Not on file   Additional Social History:                         Sleep: Fair  Appetite:  Good  Current Medications: Current Outpatient Medications  Medication Sig Dispense Refill  . ACCU-CHEK AVIVA PLUS test strip     . Accu-Chek Softclix Lancets lancets AS DIRECTED TO CHECK BLOOD SUGAR ONCE TO TWICE DAILY    . albuterol (VENTOLIN HFA) 108 (90 Base) MCG/ACT inhaler INHALE TWO PUFFS BY MOUTH EVERY 6 HOURS AS NEEDED FOR WHEEZING OR SHORTNESS OF BREATH (Patient taking differently: Inhale 2 puffs into the lungs every 6 (six) hours as needed for wheezing or shortness of breath.) 8.5 g 1  . ARIPiprazole (ABILIFY) 10 MG tablet Take 1 tablet (10 mg total) by mouth daily. 30 tablet 5  . buPROPion (WELLBUTRIN XL) 150 MG 24 hr tablet Take 3 tablets (450 mg total) by mouth daily. 90 tablet 5  . clonazePAM (KLONOPIN) 1 MG tablet Take 1-2 tablets (1-2 mg total) by mouth See admin instructions. Take 1 mg in the morning and 2 mg at night 90 tablet 4  . diclofenac Sodium (VOLTAREN) 1 % GEL Apply 1 application topically 4 (four) times daily as needed (pain).    [name] Kitchen dicyclomine (BENTYL) 10 MG capsule Take 1 capsule (10 mg total) by mouth 4 (four) times daily -  before meals and at bedtime. 120 capsule 3  . DULoxetine (CYMBALTA) 60 MG capsule Take 2 capsules (120 mg total) by mouth at bedtime. 60 capsule 4  . FIBER PO Take 1 capsule by mouth daily.     . fluticasone (FLONASE) 50 MCG/ACT nasal spray Place  1 spray into both nostrils at bedtime as needed for allergies or rhinitis.     [name] Kitchen gabapentin (NEURONTIN) 100 MG capsule Take 200 mg by mouth at bedtime.     [name] Kitchen lisinopril (PRINIVIL,ZESTRIL) 5 MG tablet Take 5 mg by mouth daily.    . metFORMIN (GLUCOPHAGE-XR) 500 MG 24 hr tablet Take 500 mg by mouth daily.     . naproxen sodium (ALEVE) 220 MG tablet Take 440 mg by mouth 2 (two) times daily as needed (pain).     [name] Kitchen  ondansetron (ZOFRAN-ODT) 4 MG disintegrating tablet TAKE 1 TABLET BY MOUTH EVERY 8 HOURS AS NEEDED FOR NAUSEA AND VOMITING (Patient taking differently: Take 4 mg by mouth every 8 (eight) hours as needed for nausea or vomiting.) 40 tablet 3  . rosuvastatin (CRESTOR) 20 MG tablet Take 10 mg by mouth daily.    . Semaglutide, 1 MG/DOSE, (OZEMPIC, 1 MG/DOSE,) 2 MG/1.5ML SOPN Inject 1 mg into the skin every Monday.    . traZODone (DESYREL) 100 MG tablet Take 4 tablets (400 mg total) by mouth at bedtime. 120 tablet 4   No current facility-administered medications for this visit.    Lab Results: No results found for this or any previous visit (from the past 48 hour(s)).  Physical Findings: AIMS:  , ,  ,  ,    CIWA:    COWS:     Musculoskeletal: Strength & Muscle Tone: within normal limits Gait & Station: normal Patient leans: Right  Psychiatric Specialty Exam: ROS  There were no vitals taken for this visit.There is no height or weight on file to calculate BMI.  General Appearance: Casual  Eye Contact::  Good  Speech:  Clear and Coherent  Volume:  Normal  Mood:Depressed  Affect:  Appropriate  Today the patient is doing very well. Thought Process:  Coherent  Orientation:  Full (Time, Place, and Person)  Thought Content:  NA and WDL  Suicidal Thoughts:  yes  Homicidal Thoughts:  No  Memory:  Immediate;   NA  Judgement:  Good  Insight:  Good  Psychomotor Activity:  NA  Concentration:  Good  Recall:  Good  Fund of Knowledge:Good  Language: Good  Akathisia:  No  Handed:  Right   AIMS (if indicated):     Assets:  Desire for Improvement  ADL's:  Intact  Cognition: WNL  Sleep:      Treatment Plan Summary: 11/25/2020, 3:36 PM   This patient has 3 problems.  First is that of major depression.  Patient continues taking Cymbalta 120 mg and Wellbutrin 450 mg.  She also takes Abilify 10 mg.  She will continue in one-to-one therapy and she is actually doing pretty well.  Her second problem is adjustment disorder with an anxious mood state.  She continues taking Klonopin 1 mg in the morning and 2 mg at night.  Her anxiety is reasonably controlled.  Her third problem is insomnia and she will continue taking trazodone 400 mg every night.  On this regime the patient seems to be stable.  She is not suicidal.

## 2020-11-27 ENCOUNTER — Encounter: Payer: Self-pay | Admitting: Internal Medicine

## 2020-11-27 ENCOUNTER — Telehealth: Payer: Self-pay | Admitting: Neurology

## 2020-11-27 NOTE — Telephone Encounter (Signed)
-----   Message from Melvyn Novas, MD sent at 11/21/2020 10:53 AM EDT ----- : This HST did only find very mild OSA (obstructive sleep apnea) to be present and indicated no sustained hypoxia. At baseline, there is some tachycardia. There was snoring recorded.    RECOMMENDATION: I would like to offer CPAP as a try-out. The patient has many other options to treat such mild apnea ( if she chooses to) but in order to help her with deeper breathing, a PAP therapy would be an option. I would also recommend to pursue weight loss under medical supervision.    INTERPRETING PHYSICIAN:  Melvyn Novas, MD

## 2020-11-27 NOTE — Telephone Encounter (Signed)
I called Jamie [name]. I advised Jamie [name] that Dr. Vickey Huger reviewed their sleep study results and found that Jamie [name] very mild sleep apnea. Dr. Vickey Huger recommends that Jamie [name] starts a new CPAP, if she chooses to continue. I reviewed PAP compliance expectations with the Jamie [name]. Jamie [name] is agreeable to starting a CPAP. I advised Jamie [name] that an order will be sent to a DME, [facility] and [facility] will call the Jamie [name] within about one week after they file with the Jamie [name]'s insurance. [facility]) will show the Jamie [name] how to use the machine, fit for masks, and troubleshoot the CPAP if needed. A follow up appt will need to be made for insurance purposes with Dr. Vickey Huger . Jamie [name] verbalized understanding to call and schedule this apt 31-90 days from the date she picks up the machine. A letter with all of this information in it will be sent to the Jamie [name] as a reminder. Jamie [name] verbalized understanding of results. Jamie [name] had no questions at this time but was encouraged to call back if questions arise. I have sent the order to [facility]) and have received confirmation that they have received the order.

## 2020-12-05 DIAGNOSIS — J209 Acute bronchitis, unspecified: Secondary | ICD-10-CM | POA: Diagnosis not present

## 2020-12-05 DIAGNOSIS — R509 Fever, unspecified: Secondary | ICD-10-CM | POA: Diagnosis not present

## 2020-12-17 ENCOUNTER — Ambulatory Visit: Payer: Medicare Other | Admitting: Gastroenterology

## 2020-12-18 NOTE — Telephone Encounter (Signed)
Pt has called to inform that she has called [facility]) multiple times and is always told they have not received anything from Dr Jamie [name].  Pt is asking that whatever documents is being sent over please send again

## 2020-12-18 NOTE — Telephone Encounter (Signed)
[facility] is now aware of the order and will start the process to get insurance authorization.

## 2020-12-19 ENCOUNTER — Other Ambulatory Visit: Payer: Self-pay | Admitting: Neurology

## 2020-12-19 DIAGNOSIS — Z9114 Patient's other noncompliance with medication regimen: Secondary | ICD-10-CM

## 2020-12-19 DIAGNOSIS — G4719 Other hypersomnia: Secondary | ICD-10-CM

## 2021-01-18 ENCOUNTER — Other Ambulatory Visit: Payer: Self-pay | Admitting: Internal Medicine

## 2021-02-25 ENCOUNTER — Ambulatory Visit (HOSPITAL_COMMUNITY): Payer: Medicare Other | Admitting: Psychiatry

## 2021-03-10 ENCOUNTER — Other Ambulatory Visit: Payer: Self-pay

## 2021-03-10 ENCOUNTER — Ambulatory Visit (INDEPENDENT_AMBULATORY_CARE_PROVIDER_SITE_OTHER): Payer: Medicare Other | Admitting: Psychiatry

## 2021-03-10 DIAGNOSIS — F329 Major depressive disorder, single episode, unspecified: Secondary | ICD-10-CM | POA: Diagnosis not present

## 2021-03-10 MED ORDER — ARIPIPRAZOLE 10 MG PO TABS: ORAL_TABLET | ORAL | 5 refills | Status: DC

## 2021-03-10 NOTE — Progress Notes (Signed)
Patient ID: Jamie [name], female   DOB: November 09, 1970, 50 y.o.   MRN: [id number] [facility] MD Progress Note  03/10/2021 3:26 PM LYRICK WORLAND  MRN:  [id number] Subjective:  Feeling well. Principal Problem: Major depression recurrent  Today the patient is well.  Her depression is very present and is on a daily basis.  On her last visit she did not feel this way she is worse.  Her sleeping is better since having her CPAP adjusted.  Her appetite is down as is her energy level.  She shows psychomotor slowing.  She is having some feelings of anhedonia but she still enjoys time with her dog and her family.  She lives with her mother and her sister as well as her husband.  The patient feels very guilty about a lot of things.  The patient says her depression is worsened over the last year since the murder of her nephew and his best friend.  The patient denies being suicidal.  She states she would never do that to her mother.  The patient is experiencing auditory hallucinations.  There are voices that she can distract herself from.  The voices and may also voice telling her to hurt herself.  She has no weapons at home and has no true intent to act on these.  The patient has been hospitalized 7 times in the past and she knows when she needs to come into the hospital.  She says she is not that sick at this time.  She has no visual hallucinations and she is not paranoid.  The patient been on multiple psychotropic medications.  The patient has no intentions to hurt herself or hurt anybody else.  Her husband is very supportive.  Today he was in the session and he adamantly made the plan that the patient will enter our partial hospitalization program.  The patient is in complete agreement to being in the program.  Today on intervention worked to double her Abilify from 10 mg to 20.  She will continue taking Cymbalta which helps both her pain and her depression.  She will continue Wellbutrin 450 mg.  The possibility in the  future of discontinuing Wellbutrin and replacing with desipramine would be considered.  The patient has lost some weight hesitant to start Remeron her case.  Generally she is sleeping better.  Overall patient is very compliant and I think she is very willing to come into a program and to be aggressive about her treatment of depression.  There is no other significant stressors other than the fact that she is reexperiencing the sense of the loss and the grief from the death and murder of her nephew [place].  She was very close to him. Past Medical History:  Diagnosis Date   Anxiety    Arthritis    knees   Bipolar 1 disorder (HCC)    Chronic diarrhea    Diverticulosis of colon    GERD (gastroesophageal reflux disease)    Hiatal hernia    History of concussion    per pt 11/ 2020 w/ no residual   Hyperlipidemia    Hypertension    followed by pcp   (11-29-2019  per pt stated never had stress test)   MDD (major depressive disorder)    Migraine    neurologist--- dr Terrace Arabia   OSA on CPAP    followed by dr dohmeier   PCOS (polycystic ovarian syndrome)    PONV (postoperative nausea and vomiting)    Type 2  diabetes mellitus (HCC)    followed by pcp   (11-29-2019  pt stated checks blood sugar daily in am,  fasting sugar--- 96    Past Surgical History:  Procedure Laterality Date   ANAL FISSURE REPAIR  x3  last one  2007  approx.   BIOPSY  09/06/2019   Procedure: BIOPSY;  Surgeon: [name], MD;  Location: [facility];  Service: Endoscopy;;   COLONOSCOPY WITH PROPOFOL [name] 09/06/2019   pancolonic diverticulosis. Segmental biopsy completed with benign biopsies.    ESOPHAGOGASTRODUODENOSCOPY (EGD) WITH PROPOFOL [name] 06/16/2020   Dr. [name]: normal esophagus s/p dilation, normal stomach, duodenum.    EVALUATION UNDER ANESTHESIA WITH HEMORRHOIDECTOMY [name] 12/05/2019   Procedure: ANORECTAL EXAM UNDER ANESTHESIA WITH HEMORRHOIDECTOMY, HEMORRHOIDAL LIGATION/PEXY;  Surgeon: [name], MD;  Location:  [facility];  Service: General;  Laterality: [name];  GENERAL AND LOCAL   EVALUATION UNDER ANESTHESIA WITH TEAR DUCT PROBING  infant   KNEE ARTHROSCOPY Right 1994   LAPAROSCOPIC CHOLECYSTECTOMY  08-06-2002  @[facility]    LUMBAR LAMINECTOMY/DECOMPRESSION MICRODISCECTOMY Right 01/22/2014   Procedure: LUMBAR FOUR TO FIVE LUMBAR LAMINECTOMY/DECOMPRESSION MICRODISCECTOMY 1 LEVEL;  Surgeon: 01/24/2014, MD;  Location: [facility];  Service: Neurosurgery;  Laterality: Right;  Right L45 diskectomy   MALONEY DILATION [name] 06/16/2020   Procedure: 06/18/2020 DILATION;  Surgeon: Elease [name], MD;  Location: [facility];  Service: Endoscopy;  Laterality: [name];   Family History:  Family History  Problem Relation Age of Onset   COPD Father    Anxiety disorder Maternal Grandmother    Depression Maternal Grandmother    Colon cancer Neg Hx    Colon polyps Neg Hx    Family Psychiatric  History:  Social History:  Social History   Substance and Sexual Activity  Alcohol Use No     Social History   Substance and Sexual Activity  Drug Use Never    Social History   Socioeconomic History   Marital status: Married    Spouse name: Not on file   Number of children: 0   Years of education: college   Highest education level: Not on file  Occupational History   Occupation: disabled  Tobacco Use   Smoking status: Never   Smokeless tobacco: Never  Vaping Use   Vaping Use: Never used  Substance and Sexual Activity   Alcohol use: No   Drug use: Never   Sexual activity: Yes    Partners: Male    Birth control/protection: None  Other Topics Concern   Not on file  Social History Narrative   Patient lives at home alone    Patient is right handed   Patient drinks soda's daily   Social Determinants of Health   Financial Resource Strain: Not on file  Food Insecurity: Not on file  Transportation Needs: Not on file  Physical Activity: Not on file  Stress: Not on file  Social Connections:  Not on file   Additional Social History:                         Sleep: Fair  Appetite:  Good  Current Medications: Current Outpatient Medications  Medication Sig Dispense Refill   ACCU-CHEK AVIVA PLUS test strip      Accu-Chek Softclix Lancets lancets AS DIRECTED TO CHECK BLOOD SUGAR ONCE TO TWICE DAILY     albuterol (VENTOLIN HFA) 108 (90 Base) MCG/ACT inhaler INHALE TWO PUFFS BY MOUTH EVERY 6 HOURS AS  NEEDED FOR WHEEZING OR SHORTNESS OF BREATH (Patient taking differently: Inhale 2 puffs into the lungs every 6 (six) hours as needed for wheezing or shortness of breath.) 8.5 g 1   ARIPiprazole (ABILIFY) 10 MG tablet 2 qam 60 tablet 5   buPROPion (WELLBUTRIN XL) 150 MG 24 hr tablet Take 3 tablets (450 mg total) by mouth daily. 90 tablet 5   clonazePAM (KLONOPIN) 1 MG tablet Take 1-2 tablets (1-2 mg total) by mouth See admin instructions. Take 1 mg in the morning and 2 mg at night 90 tablet 4   diclofenac Sodium (VOLTAREN) 1 % GEL Apply 1 application topically 4 (four) times daily as needed (pain).     dicyclomine (BENTYL) 10 MG capsule Take 1 capsule (10 mg total) by mouth 4 (four) times daily -  before meals and at bedtime. 120 capsule 3   DULoxetine (CYMBALTA) 60 MG capsule Take 2 capsules (120 mg total) by mouth at bedtime. 60 capsule 4   FIBER PO Take 1 capsule by mouth daily.      fluticasone (FLONASE) 50 MCG/ACT nasal spray Place 1 spray into both nostrils at bedtime as needed for allergies or rhinitis.      gabapentin (NEURONTIN) 100 MG capsule Take 200 mg by mouth at bedtime.      lisinopril (PRINIVIL,ZESTRIL) 5 MG tablet Take 5 mg by mouth daily.     metFORMIN (GLUCOPHAGE-XR) 500 MG 24 hr tablet Take 500 mg by mouth daily.      naproxen sodium (ALEVE) 220 MG tablet Take 440 mg by mouth 2 (two) times daily as needed (pain).      omeprazole-sodium bicarbonate (ZEGERID) 40-1100 MG capsule TAKE ONE CAPSULE BY MOUTH TWICE DAILY BEFORE BREAKFAST AND BEFORE SUPPER 60 capsule 5    ondansetron (ZOFRAN-ODT) 4 MG disintegrating tablet TAKE 1 TABLET BY MOUTH EVERY 8 HOURS AS NEEDED FOR NAUSEA AND VOMITING (Patient taking differently: Take 4 mg by mouth every 8 (eight) hours as needed for nausea or vomiting.) 40 tablet 3   rosuvastatin (CRESTOR) 20 MG tablet Take 10 mg by mouth daily.     Semaglutide, 1 MG/DOSE, (OZEMPIC, 1 MG/DOSE,) 2 MG/1.5ML SOPN Inject 1 mg into the skin every Monday.     traZODone (DESYREL) 100 MG tablet Take 4 tablets (400 mg total) by mouth at bedtime. 120 tablet 4   No current facility-administered medications for this visit.    Lab Results: No results found for this or any previous visit (from the past 48 hour(s)).  Physical Findings: AIMS:  , ,  ,  ,    CIWA:    COWS:     Musculoskeletal: Strength & Muscle Tone: within normal limits Gait & Station: normal Patient leans: Right  Psychiatric Specialty Exam: ROS  There were no vitals taken for this visit.There is no height or weight on file to calculate BMI.  General Appearance: Casual  Eye Contact::  Good  Speech:  Clear and Coherent  Volume:  Normal  Mood:Depressed  Affect:  Appropriate  Today the patient is doing very well. Thought Process:  Coherent  Orientation:  Full (Time, Place, and Person)  Thought Content:  NA and WDL  Suicidal Thoughts:  yes  Homicidal Thoughts:  No  Memory:  Immediate;   NA  Judgement:  Good  Insight:  Good  Psychomotor Activity:  NA  Concentration:  Good  Recall:  Good  Fund of Knowledge:Good  Language: Good  Akathisia:  No  Handed:  Right  AIMS (if indicated):  Assets:  Desire for Improvement  ADL's:  Intact  Cognition: WNL  Sleep:      Treatment Plan Summary: 03/10/2021, 3:26 PM  This patient has 3 problems.  The first is that of major depression with psychosis.  Today we will double her Abilify to taking 20 mg at night.  She will continue taking Cymbalta and Wellbutrin to the maximum doses.  Her second problem is that of adjustment  disorder with an anxious mood state.  Her anxiety is actually fairly well controlled taking Klonopin 1 mg in the morning and 2 mg at night.  Her third problem is insomnia.  The combination of an adjustment of her sleep CPAP machin together with the use of a 400 mg of trazodone is helping her sleep.  We will be certain to keep contact with this patient over the next few days and to get her into the partial hospitalization program.  I do not think she is acutely suicidal at this time.  She is made a commitment for treatment she has no weapons at home and she is very much in agreement with coming into our partial program.  She also notes that she can distract herself from the voices and has no intentions to take any actions of what it is telling her.

## 2021-03-11 ENCOUNTER — Telehealth (HOSPITAL_COMMUNITY): Payer: Self-pay | Admitting: Licensed Clinical Social Worker

## 2021-03-17 ENCOUNTER — Encounter: Payer: Self-pay | Admitting: Internal Medicine

## 2021-03-17 ENCOUNTER — Ambulatory Visit: Payer: Medicare Other | Admitting: Gastroenterology

## 2021-04-19 ENCOUNTER — Other Ambulatory Visit (HOSPITAL_COMMUNITY): Payer: Self-pay | Admitting: Psychiatry

## 2021-04-19 DIAGNOSIS — F323 Major depressive disorder, single episode, severe with psychotic features: Secondary | ICD-10-CM

## 2021-05-13 ENCOUNTER — Other Ambulatory Visit: Payer: Self-pay

## 2021-05-13 ENCOUNTER — Telehealth (HOSPITAL_BASED_OUTPATIENT_CLINIC_OR_DEPARTMENT_OTHER): Payer: Medicare Other | Admitting: Psychiatry

## 2021-05-13 DIAGNOSIS — F333 Major depressive disorder, recurrent, severe with psychotic symptoms: Secondary | ICD-10-CM | POA: Diagnosis not present

## 2021-05-13 DIAGNOSIS — F323 Major depressive disorder, single episode, severe with psychotic features: Secondary | ICD-10-CM

## 2021-05-13 MED ORDER — BUPROPION HCL ER (XL) 150 MG PO TB24: 450.0000 mg | ORAL_TABLET | Freq: Every day | ORAL | 5 refills | Status: DC

## 2021-05-13 MED ORDER — CLONAZEPAM 1 MG PO TABS: ORAL_TABLET | ORAL | 4 refills | Status: DC

## 2021-05-13 MED ORDER — DULOXETINE HCL 60 MG PO CPEP: 120.0000 mg | ORAL_CAPSULE | Freq: Every day | ORAL | 4 refills | Status: DC

## 2021-05-13 NOTE — Progress Notes (Addendum)
Patient ID: Jamie [name], female   DOB: 04-11-1971, 50 y.o.   MRN: [id number] [facility] MD Progress Note  05/13/2021 3:10 PM ARLY SALMINEN  MRN:  [id number] Subjective:  Feeling well. Principal Problem: Major depression recurrent  Today the patient is doing better.  Unfortunately there was a number of confusion issues.  She was supposed to come into the office today but she did not because she has been exposed to COVID just in the last 24 hours.  She called to change it to a phone visit but that information never came across in the schedule.  Therefore we called her and spoke to her for about 10 to 15 minutes.  The patient actually fortunately is doing quite well.  Her mood improved.  Her voices are much much less.  She has no visual hallucinations and no paranoia.  She now sleeping and eating well.  She seems to be enjoying more things.  She just got a new puppy.  The increase of Abilify to 20 mg she believes has been very helpful.  Her mother her sister and her husband who she lives with are doing well.  She has more energy and more activity or fire inside of him.  She continues taking Klonopin which controls her anxiety.  It turns out there was a problem also with scheduling her for the partial hospital program.  Her plan was for her to come into the hospital but apparently there was a miscommunication in terms of the person who called her.  She said actually she could not hear the call very clearly.  She is not clear what she wanted to do.  The patient instead held time and over the next few weeks got better.  She is now near her baseline.  She will return to see me in 1 to 2 months.  She is not suicidal now.  She is functioning much better.  Virtual Visit via Telephone Note  I connected with Jamie [name] on 05/13/2021 at  2:30 PM EDT by telephone and verified that I am speaking with the correct person using two identifiers.  Location: Patient: home Provider: office   I discussed the  limitations, risks, security and privacy concerns of performing an evaluation and management service by telephone and the availability of in person appointments. I also discussed with the patient that there may be a patient responsible charge related to this service. The patient expressed understanding and agreed to proceed.    I discussed the assessment and treatment plan with the patient. The patient was provided an opportunity to ask questions and all were answered. The patient agreed with the plan and demonstrated an understanding of the instructions.   The patient was advised to call back or seek an in-person evaluation if the symptoms worsen or if the condition fails to improve as anticipated.  I provided 30 minutes of non-face-to-face time during this encounter.   [name], MD   Past Medical History:  Diagnosis Date   Anxiety    Arthritis    knees   Bipolar 1 disorder (HCC)    Chronic diarrhea    Diverticulosis of colon    GERD (gastroesophageal reflux disease)    Hiatal hernia    History of concussion    per pt 11/ 2020 w/ no residual   Hyperlipidemia    Hypertension    followed by pcp   (11-29-2019  per pt stated never had stress test)   MDD (major depressive disorder)  Migraine    neurologist--- dr Terrace Arabia   OSA on CPAP    followed by dr dohmeier   PCOS (polycystic ovarian syndrome)    PONV (postoperative nausea and vomiting)    Type 2 diabetes mellitus (HCC)    followed by pcp   (11-29-2019  pt stated checks blood sugar daily in am,  fasting sugar--- 96    Past Surgical History:  Procedure Laterality Date   ANAL FISSURE REPAIR  x3  last one  2007  approx.   BIOPSY  09/06/2019   Procedure: BIOPSY;  Surgeon: [name], MD;  Location: [facility];  Service: Endoscopy;;   COLONOSCOPY WITH PROPOFOL [name] 09/06/2019   pancolonic diverticulosis. Segmental biopsy completed with benign biopsies.    ESOPHAGOGASTRODUODENOSCOPY (EGD) WITH PROPOFOL [name] 06/16/2020    Dr. [name]: normal esophagus s/p dilation, normal stomach, duodenum.    EVALUATION UNDER ANESTHESIA WITH HEMORRHOIDECTOMY [name] 12/05/2019   Procedure: ANORECTAL EXAM UNDER ANESTHESIA WITH HEMORRHOIDECTOMY, HEMORRHOIDAL LIGATION/PEXY;  Surgeon: [name], MD;  [facility];  Service: General;  Laterality: [name];  GENERAL AND LOCAL   EVALUATION UNDER ANESTHESIA WITH TEAR DUCT PROBING  infant   KNEE ARTHROSCOPY Right 1994   LAPAROSCOPIC CHOLECYSTECTOMY  08-06-2002  @[facility]    LUMBAR LAMINECTOMY/DECOMPRESSION MICRODISCECTOMY Right 01/22/2014   Procedure: LUMBAR FOUR TO FIVE LUMBAR LAMINECTOMY/DECOMPRESSION MICRODISCECTOMY 1 LEVEL;  Surgeon: 01/24/2014, MD;  Location: [facility];  Service: Neurosurgery;  Laterality: Right;  Right L45 diskectomy   MALONEY DILATION [name] 06/16/2020   Procedure: 06/18/2020 DILATION;  Surgeon: Elease [name], MD;  Location: [facility];  Service: Endoscopy;  Laterality: [name];   Family History:  Family History  Problem Relation Age of Onset   COPD Father    Anxiety disorder Maternal Grandmother    Depression Maternal Grandmother    Colon cancer Neg Hx    Colon polyps Neg Hx    Family Psychiatric  History:  Social History:  Social History   Substance and Sexual Activity  Alcohol Use No     Social History   Substance and Sexual Activity  Drug Use Never    Social History   Socioeconomic History   Marital status: Married    Spouse name: Not on file   Number of children: 0   Years of education: college   Highest education level: Not on file  Occupational History   Occupation: disabled  Tobacco Use   Smoking status: Never   Smokeless tobacco: Never  Vaping Use   Vaping Use: Never used  Substance and Sexual Activity   Alcohol use: No   Drug use: Never   Sexual activity: Yes    Partners: Male    Birth control/protection: None  Other Topics Concern   Not on file  Social History Narrative   Patient lives at home alone     Patient is right handed   Patient drinks soda's daily   Social Determinants of Health   Financial Resource Strain: Not on file  Food Insecurity: Not on file  Transportation Needs: Not on file  Physical Activity: Not on file  Stress: Not on file  Social Connections: Not on file   Additional Social History:                         Sleep: Fair  Appetite:  Good  Current Medications: Current Outpatient Medications  Medication Sig Dispense Refill   ACCU-CHEK AVIVA PLUS test strip  Accu-Chek Softclix Lancets lancets AS DIRECTED TO CHECK BLOOD SUGAR ONCE TO TWICE DAILY     albuterol (VENTOLIN HFA) 108 (90 Base) MCG/ACT inhaler INHALE TWO PUFFS BY MOUTH EVERY 6 HOURS AS NEEDED FOR WHEEZING OR SHORTNESS OF BREATH (Patient taking differently: Inhale 2 puffs into the lungs every 6 (six) hours as needed for wheezing or shortness of breath.) 8.5 g 1   ARIPiprazole (ABILIFY) 10 MG tablet 2 qam 60 tablet 5   buPROPion (WELLBUTRIN XL) 150 MG 24 hr tablet Take 3 tablets (450 mg total) by mouth daily. 90 tablet 5   clonazePAM (KLONOPIN) 1 MG tablet Take 1 mg in the morning and 2 mg at night 90 tablet 4   diclofenac Sodium (VOLTAREN) 1 % GEL Apply 1 application topically 4 (four) times daily as needed (pain).     dicyclomine (BENTYL) 10 MG capsule Take 1 capsule (10 mg total) by mouth 4 (four) times daily -  before meals and at bedtime. 120 capsule 3   DULoxetine (CYMBALTA) 60 MG capsule Take 2 capsules (120 mg total) by mouth at bedtime. 60 capsule 4   FIBER PO Take 1 capsule by mouth daily.      fluticasone (FLONASE) 50 MCG/ACT nasal spray Place 1 spray into both nostrils at bedtime as needed for allergies or rhinitis.      gabapentin (NEURONTIN) 100 MG capsule Take 200 mg by mouth at bedtime.      lisinopril (PRINIVIL,ZESTRIL) 5 MG tablet Take 5 mg by mouth daily.     metFORMIN (GLUCOPHAGE-XR) 500 MG 24 hr tablet Take 500 mg by mouth daily.      naproxen sodium (ALEVE) 220 MG tablet  Take 440 mg by mouth 2 (two) times daily as needed (pain).      omeprazole-sodium bicarbonate (ZEGERID) 40-1100 MG capsule TAKE ONE CAPSULE BY MOUTH TWICE DAILY BEFORE BREAKFAST AND BEFORE SUPPER 60 capsule 5   ondansetron (ZOFRAN-ODT) 4 MG disintegrating tablet TAKE 1 TABLET BY MOUTH EVERY 8 HOURS AS NEEDED FOR NAUSEA AND VOMITING (Patient taking differently: Take 4 mg by mouth every 8 (eight) hours as needed for nausea or vomiting.) 40 tablet 3   rosuvastatin (CRESTOR) 20 MG tablet Take 10 mg by mouth daily.     Semaglutide, 1 MG/DOSE, (OZEMPIC, 1 MG/DOSE,) 2 MG/1.5ML SOPN Inject 1 mg into the skin every Monday.     traZODone (DESYREL) 100 MG tablet Take 4 tablets (400 mg total) by mouth at bedtime. 120 tablet 4   No current facility-administered medications for this visit.    Lab Results: No results found for this or any previous visit (from the past 48 hour(s)).  Physical Findings: AIMS:  , ,  ,  ,    CIWA:    COWS:     Musculoskeletal: Strength & Muscle Tone: within normal limits Gait & Station: normal Patient leans: Right  Psychiatric Specialty Exam: ROS  There were no vitals taken for this visit.There is no height or weight on file to calculate BMI.  General Appearance: Casual  Eye Contact::  Good  Speech:  Clear and Coherent  Volume:  Normal  Mood:Depressed  Affect:  Appropriate  Today the patient is doing very well. Thought Process:  Coherent  Orientation:  Full (Time, Place, and Person)  Thought Content:  NA and WDL  Suicidal Thoughts:  yes  Homicidal Thoughts:  No  Memory:  Immediate;   NA  Judgement:  Good  Insight:  Good  Psychomotor Activity:  NA  Concentration:  Good  Recall:  Good  Fund of Knowledge:Good  Language: Good  Akathisia:  No  Handed:  Right  AIMS (if indicated):     Assets:  Desire for Improvement  ADL's:  Intact  Cognition: WNL  Sleep:      Treatment Plan Summary: 05/13/2021, 3:10 PM  This patient's first problem is major depression  with psychosis.  At this time her psychotic symptoms are much less.  She denies daily persistent depression..  She feels better.  She will continue taking 20 mg of Abilify Wellbutrin 450 mg and Cymbalta 120 mg.  Her second problem is that of an adjustment disorder with an anxious mood state.  She will continue taking Klonopin 1 mg 1 in the morning and 2 at night.  This patient she will return to see me in 2 months.  Her third problem is insomnia and she will continue taking 400 mg of trazodone in addition to a CPAP machine.

## 2021-05-18 ENCOUNTER — Other Ambulatory Visit: Payer: Self-pay | Admitting: Gastroenterology

## 2021-05-19 ENCOUNTER — Other Ambulatory Visit: Payer: Self-pay

## 2021-05-21 MED ORDER — ONDANSETRON 4 MG PO TBDP: ORAL_TABLET | ORAL | 1 refills | Status: DC

## 2021-06-08 ENCOUNTER — Other Ambulatory Visit (HOSPITAL_COMMUNITY): Payer: Self-pay | Admitting: Psychiatry

## 2021-06-22 ENCOUNTER — Other Ambulatory Visit (HOSPITAL_COMMUNITY): Payer: Self-pay | Admitting: *Deleted

## 2021-06-22 MED ORDER — TRAZODONE HCL 100 MG PO TABS: 400.0000 mg | ORAL_TABLET | Freq: Every day | ORAL | 0 refills | Status: DC

## 2021-07-14 ENCOUNTER — Telehealth (HOSPITAL_BASED_OUTPATIENT_CLINIC_OR_DEPARTMENT_OTHER): Payer: Medicare Other | Admitting: Psychiatry

## 2021-07-14 ENCOUNTER — Other Ambulatory Visit: Payer: Self-pay

## 2021-07-14 DIAGNOSIS — F324 Major depressive disorder, single episode, in partial remission: Secondary | ICD-10-CM | POA: Diagnosis not present

## 2021-07-14 DIAGNOSIS — F323 Major depressive disorder, single episode, severe with psychotic features: Secondary | ICD-10-CM | POA: Diagnosis not present

## 2021-07-14 MED ORDER — BUPROPION HCL ER (XL) 150 MG PO TB24: 450.0000 mg | ORAL_TABLET | Freq: Every day | ORAL | 5 refills | Status: DC

## 2021-07-14 MED ORDER — CLONAZEPAM 1 MG PO TABS: ORAL_TABLET | ORAL | 4 refills | Status: DC

## 2021-07-14 MED ORDER — ARIPIPRAZOLE 10 MG PO TABS: ORAL_TABLET | ORAL | 5 refills | Status: DC

## 2021-07-14 MED ORDER — TRAZODONE HCL 100 MG PO TABS: 400.0000 mg | ORAL_TABLET | Freq: Every day | ORAL | 1 refills | Status: DC

## 2021-07-14 MED ORDER — DULOXETINE HCL 60 MG PO CPEP: 120.0000 mg | ORAL_CAPSULE | Freq: Every day | ORAL | 4 refills | Status: DC

## 2021-07-14 NOTE — Progress Notes (Signed)
Patient ID: Jamie [name], female   DOB: Apr 24, 1971, 50 y.o.   MRN: [id number] [facility] MD Progress Note  07/14/2021 2:40 PM AUNDRA PUNG  MRN:  [id number] Subjective:  Feeling well. Principal Problem: Major depression recurrent  Today the patient is doing fairly well.  Her son [place] who was killed murdered and just had the hearing on the person who did kill them.  He did a plea agreement and will go to jail for 10 years without parole.  He has a lot of other charges and will likely spend even more time in jail.  This is actually a good outcome.  To go to court would have been exhausting and traumatic for this patient.  The patient is doing fairly well.  She is pleased with this outcome.  Her husband [name] is doing okay.  The patient has a pug puppy named Sheral Flow.  The dog is doing great.  The patient is sleeping and eating well.  Importantly the patient denies auditory or visual hallucinations.  She has no paranoia.  Patient is taking Abilify 20 mg without a problem.  Today she would have been here in person but she had problem with her car.  The patient will continue taking Wellbutrin and Cymbalta as prescribed.  The patient lives with her mother and sister and overall everybody is doing fairly well.  Medically the patient is doing only fairly well.  She has lost weight which is good but her diabetes still shows a hemoglobin A1c of 8.  Her cholesterol is elevated.  Overall the patient is doing fairly well and is looking forward to Christmas.  She will return to see me in 3 months in person.  Virtual Visit via Telephone Note  I connected with Jamie [name] on 05/13/2021 at  2:00 PM EST by telephone and verified that I am speaking with the correct person using two identifiers.  Location: Patient: home Provider: office   I discussed the limitations, risks, security and privacy concerns of performing an evaluation and management service by telephone and the availability of in person  appointments. I also discussed with the patient that there may be a patient responsible charge related to this service. The patient expressed understanding and agreed to proceed.    I discussed the assessment and treatment plan with the patient. The patient was provided an opportunity to ask questions and all were answered. The patient agreed with the plan and demonstrated an understanding of the instructions.   The patient was advised to call back or seek an in-person evaluation if the symptoms worsen or if the condition fails to improve as anticipated.  I provided 30 minutes of non-face-to-face time during this encounter.   [name], MD   Past Medical History:  Diagnosis Date   Anxiety    Arthritis    knees   Bipolar 1 disorder (HCC)    Chronic diarrhea    Diverticulosis of colon    GERD (gastroesophageal reflux disease)    Hiatal hernia    History of concussion    per pt 11/ 2020 w/ no residual   Hyperlipidemia    Hypertension    followed by pcp   (11-29-2019  per pt stated never had stress test)   MDD (major depressive disorder)    Migraine    neurologist--- dr Terrace Arabia   OSA on CPAP    followed by dr dohmeier   PCOS (polycystic ovarian syndrome)    PONV (postoperative nausea and vomiting)  Type 2 diabetes mellitus (HCC)    followed by pcp   (11-29-2019  pt stated checks blood sugar daily in am,  fasting sugar--- 96    Past Surgical History:  Procedure Laterality Date   ANAL FISSURE REPAIR  x3  last one  2007  approx.   BIOPSY  09/06/2019   Procedure: BIOPSY;  Surgeon: [name], MD;  Location: [facility];  Service: Endoscopy;;   COLONOSCOPY WITH PROPOFOL [name] 09/06/2019   pancolonic diverticulosis. Segmental biopsy completed with benign biopsies.    ESOPHAGOGASTRODUODENOSCOPY (EGD) WITH PROPOFOL [name] 06/16/2020   Dr. [name]: normal esophagus s/p dilation, normal stomach, duodenum.    EVALUATION UNDER ANESTHESIA WITH HEMORRHOIDECTOMY [name] 12/05/2019    Procedure: ANORECTAL EXAM UNDER ANESTHESIA WITH HEMORRHOIDECTOMY, HEMORRHOIDAL LIGATION/PEXY;  Surgeon: [name], MD;  Location: [facility];  Service: General;  Laterality: [name];  GENERAL AND LOCAL   EVALUATION UNDER ANESTHESIA WITH TEAR DUCT PROBING  infant   KNEE ARTHROSCOPY Right 1994   LAPAROSCOPIC CHOLECYSTECTOMY  08-06-2002     LUMBAR LAMINECTOMY/DECOMPRESSION MICRODISCECTOMY Right 01/22/2014   Procedure: LUMBAR FOUR TO FIVE LUMBAR LAMINECTOMY/DECOMPRESSION MICRODISCECTOMY 1 LEVEL;  Surgeon: Karn Cassis, MD;  Location: [facility];  Service: Neurosurgery;  Laterality: Right;  Right L45 diskectomy   MALONEY DILATION [name] 06/16/2020   Procedure: Elease [name] DILATION;  Surgeon: [name], MD;  Location: [facility];  Service: Endoscopy;  Laterality: [name];   Family History:  Family History  Problem Relation Age of Onset   COPD Father    Anxiety disorder Maternal Grandmother    Depression Maternal Grandmother    Colon cancer Neg Hx    Colon polyps Neg Hx    Family Psychiatric  History:  Social History:  Social History   Substance and Sexual Activity  Alcohol Use No     Social History   Substance and Sexual Activity  Drug Use Never    Social History   Socioeconomic History   Marital status: Married    Spouse name: Not on file   Number of children: 0   Years of education: college   Highest education level: Not on file  Occupational History   Occupation: disabled  Tobacco Use   Smoking status: Never   Smokeless tobacco: Never  Vaping Use   Vaping Use: Never used  Substance and Sexual Activity   Alcohol use: No   Drug use: Never   Sexual activity: Yes    Partners: Male    Birth control/protection: None  Other Topics Concern   Not on file  Social History Narrative   Patient lives at home alone    Patient is right handed   Patient drinks soda's daily   Social Determinants of Health   Financial Resource Strain: Not on file  Food  Insecurity: Not on file  Transportation Needs: Not on file  Physical Activity: Not on file  Stress: Not on file  Social Connections: Not on file   Additional Social History:                         Sleep: Fair  Appetite:  Good  Current Medications: Current Outpatient Medications  Medication Sig Dispense Refill   ACCU-CHEK AVIVA PLUS test strip      Accu-Chek Softclix Lancets lancets AS DIRECTED TO CHECK BLOOD SUGAR ONCE TO TWICE DAILY     albuterol (VENTOLIN HFA) 108 (90 Base) MCG/ACT inhaler INHALE TWO PUFFS BY MOUTH EVERY 6  HOURS AS NEEDED FOR WHEEZING OR SHORTNESS OF BREATH (Patient taking differently: Inhale 2 puffs into the lungs every 6 (six) hours as needed for wheezing or shortness of breath.) 8.5 g 1   ARIPiprazole (ABILIFY) 10 MG tablet 2 qam 60 tablet 5   buPROPion (WELLBUTRIN XL) 150 MG 24 hr tablet Take 3 tablets (450 mg total) by mouth daily. 90 tablet 5   clonazePAM (KLONOPIN) 1 MG tablet Take 1 mg in the morning and 2 mg at night 90 tablet 4   diclofenac Sodium (VOLTAREN) 1 % GEL Apply 1 application topically 4 (four) times daily as needed (pain).     dicyclomine (BENTYL) 10 MG capsule TAKE ONE CAPSULE BY MOUTH FOUR TIMES DAILY BEFORE MEALS AND AT BEDTIME 120 capsule 3   DULoxetine (CYMBALTA) 60 MG capsule Take 2 capsules (120 mg total) by mouth at bedtime. 60 capsule 4   FIBER PO Take 1 capsule by mouth daily.      fluticasone (FLONASE) 50 MCG/ACT nasal spray Place 1 spray into both nostrils at bedtime as needed for allergies or rhinitis.      gabapentin (NEURONTIN) 100 MG capsule Take 200 mg by mouth at bedtime.      lisinopril (PRINIVIL,ZESTRIL) 5 MG tablet Take 5 mg by mouth daily.     metFORMIN (GLUCOPHAGE-XR) 500 MG 24 hr tablet Take 500 mg by mouth daily.      naproxen sodium (ALEVE) 220 MG tablet Take 440 mg by mouth 2 (two) times daily as needed (pain).      omeprazole-sodium bicarbonate (ZEGERID) 40-1100 MG capsule TAKE ONE CAPSULE BY MOUTH TWICE  DAILY BEFORE BREAKFAST AND BEFORE SUPPER 60 capsule 5   ondansetron (ZOFRAN-ODT) 4 MG disintegrating tablet TAKE 1 TABLET BY MOUTH EVERY 8 HOURS AS NEEDED FOR NAUSEA AND VOMITING 30 tablet 1   rosuvastatin (CRESTOR) 20 MG tablet Take 10 mg by mouth daily.     Semaglutide, 1 MG/DOSE, (OZEMPIC, 1 MG/DOSE,) 2 MG/1.5ML SOPN Inject 1 mg into the skin every Monday.     traZODone (DESYREL) 100 MG tablet Take 4 tablets (400 mg total) by mouth at bedtime. 360 tablet 1   No current facility-administered medications for this visit.    Lab Results: No results found for this or any previous visit (from the past 48 hour(s)).  Physical Findings: AIMS:  , ,  ,  ,    CIWA:    COWS:     Musculoskeletal: Strength & Muscle Tone: within normal limits Gait & Station: normal Patient leans: Right  Psychiatric Specialty Exam: ROS  There were no vitals taken for this visit.There is no height or weight on file to calculate BMI.  General Appearance: Casual  Eye Contact::  Good  Speech:  Clear and Coherent  Volume:  Normal  Mood:Depressed  Affect:  Appropriate  Today the patient is doing very well. Thought Process:  Coherent  Orientation:  Full (Time, Place, and Person)  Thought Content:  NA and WDL  Suicidal Thoughts:  yes  Homicidal Thoughts:  No  Memory:  Immediate;   NA  Judgement:  Good  Insight:  Good  Psychomotor Activity:  NA  Concentration:  Good  Recall:  Good  Fund of Knowledge:Good  Language: Good  Akathisia:  No  Handed:  Right  AIMS (if indicated):     Assets:  Desire for Improvement  ADL's:  Intact  Cognition: WNL  Sleep:      Treatment Plan Summary: 07/14/2021, 2:40 PM  At this time  the patient is reasonably stable.  Her first problem is that of schizoaffective disorder.  She is well controlled with Abilify 20 mg, Wellbutrin 450 mg and Cymbalta 120 mg.  Her second problem is an adjustment disorder with an anxious mood state.  She will continue taking Klonopin 1 mg 1 in the  morning and 2 at night.  Her third problem is insomnia.  The patient will continue taking 400 mg of trazodone together with the assistance of a CPAP machine.  The patient will return to see me in 3 months.  I believe she is very stable at this time.

## 2021-08-03 ENCOUNTER — Ambulatory Visit: Payer: Medicare Other | Admitting: Gastroenterology

## 2021-09-11 IMAGING — MR MR PELVIS WO/W CM
9 series · 48 of 48 positions shown · IV contrast (7ml gadavist)
Comparison: CT abdomen/pelvis dated 09/19/2019

CLINICAL DATA: Lower abdominal pain, history of anal fissure
repair, abnormal CT

EXAM:
MRI PELVIS WITHOUT AND WITH CONTRAST
TECHNIQUE: Multiplanar multisequence MR imaging of the pelvis was performed
both before and after administration of intravenous contrast.
CONTRAST:  10mL GADAVIST GADOBUTROL 1 MMOL/ML IV SOLN

[Series 2: T2 · sagittal · 5.0mm · 0.80mm/px · 4 of 25 slices shown (1 of 2)]
[im 1/25]
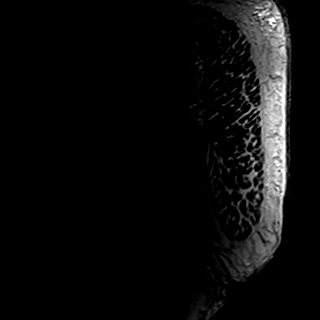
[im 9/25]
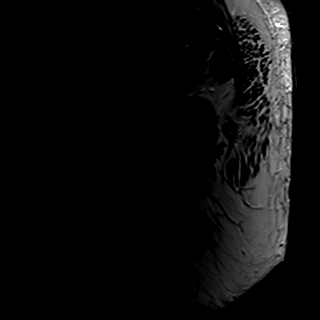
[im 17/25]
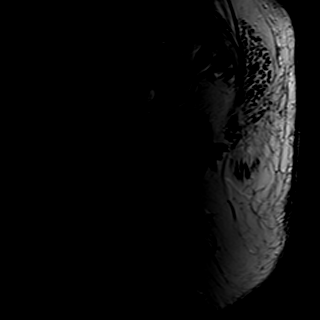
[im 25/25]
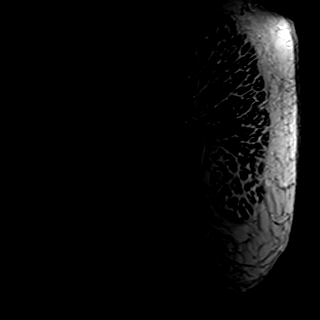

[Series 3: T2 fat-sat · sagittal · 5.0mm · 0.80mm/px · 4 of 25 slices shown (1 of 3)]
[im 1/25]
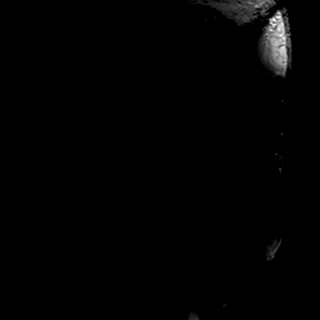
[im 9/25]
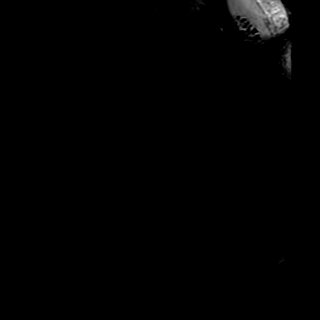
[im 17/25]
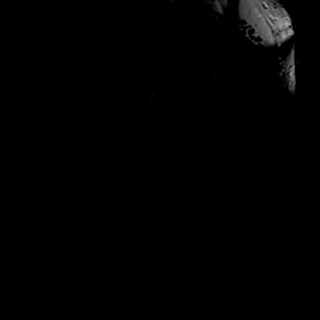
[im 25/25]
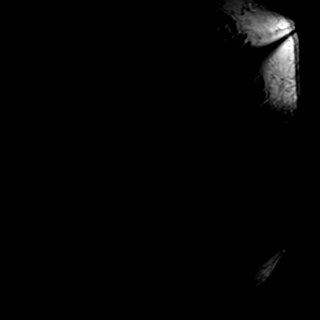

[Series 4: T1 · axial · 5.0mm · 1.07mm/px · z∈[-112,+92]mm · 6 of 40 slices shown]
[im 1/40]
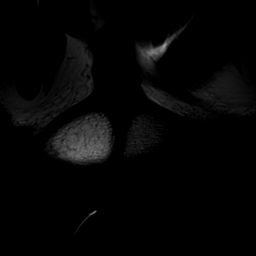
[im 8/40]
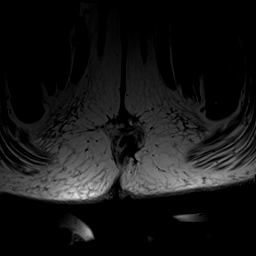
[im 16/40]
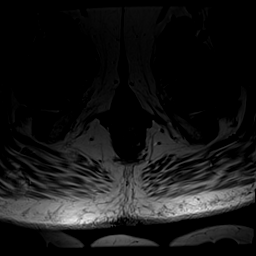
[im 24/40]
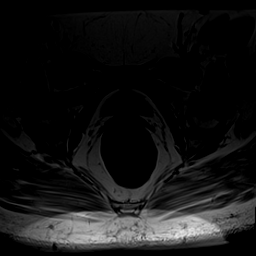
[im 32/40]
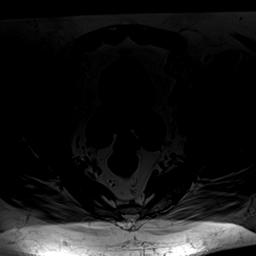
[im 40/40]
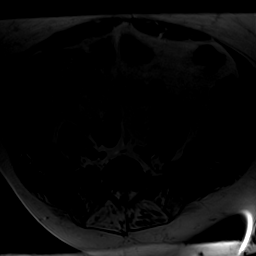

[Series 5: T2 · axial · 5.0mm · 1.07mm/px · z∈[-111,+92]mm · 6 of 40 slices shown (2 of 2)]
[im 1/40]
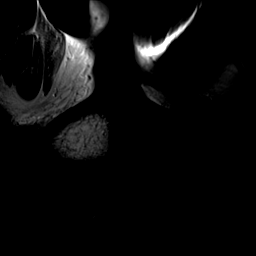
[im 8/40]
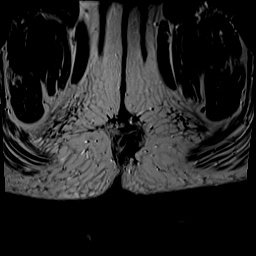
[im 16/40]
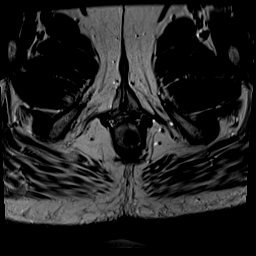
[im 24/40]
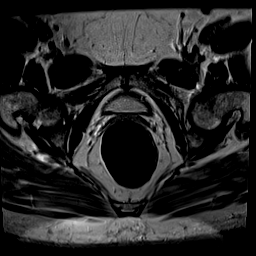
[im 32/40]
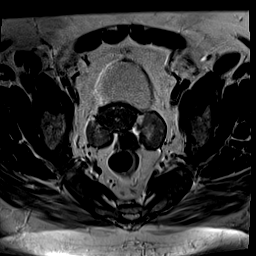
[im 40/40]
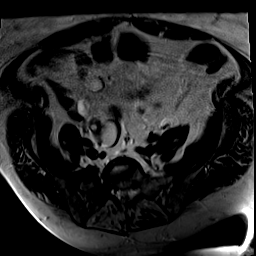

[Series 6: T2 fat-sat · axial · 5.0mm · 0.86mm/px · z∈[-111,+92]mm · 6 of 40 slices shown (2 of 3)]
[im 1/40]
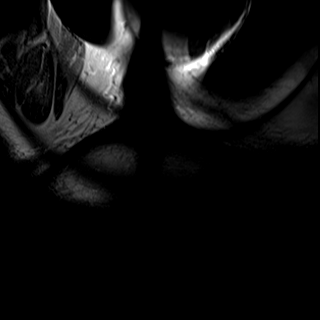
[im 8/40]
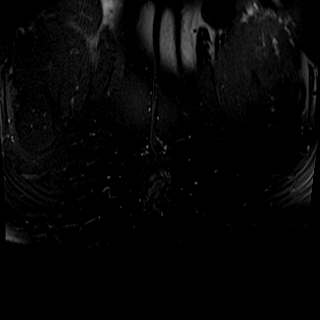
[im 16/40]
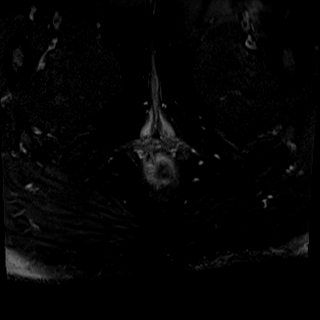
[im 24/40]
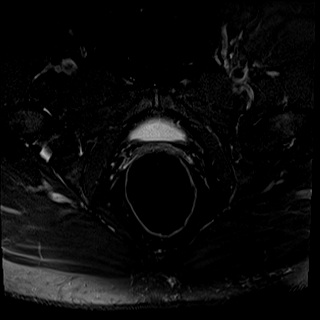
[im 32/40]
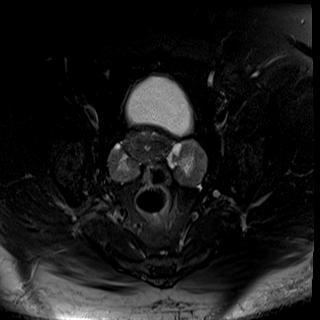
[im 40/40]
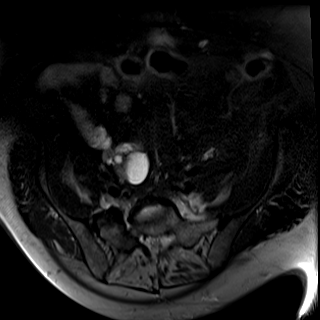

[Series 7: T2 fat-sat · coronal · 5.0mm · 0.89mm/px · 5 of 33 slices shown (3 of 3)]
[im 1/33]
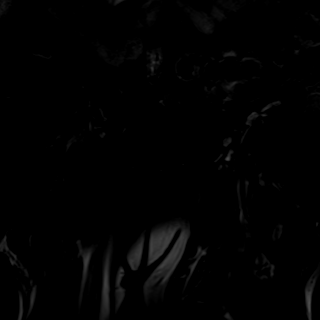
[im 9/33]
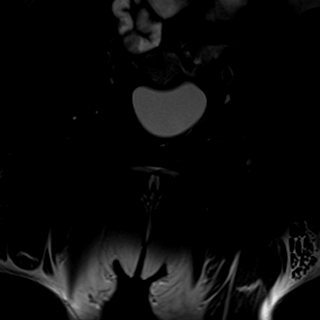
[im 17/33]
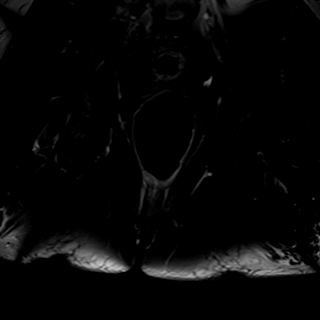
[im 25/33]
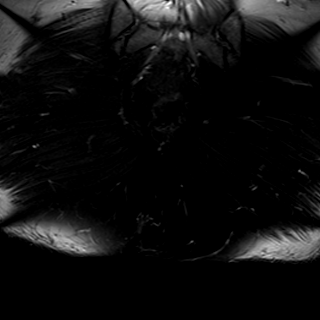
[im 33/33]
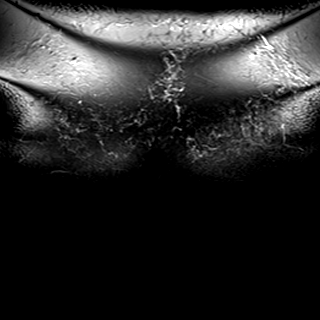

[Series 8: T1 fat-sat · axial · non-contrast · 5.0mm · 0.97mm/px · z∈[-99,+104]mm · 6 of 40 slices shown]
[im 1/40]
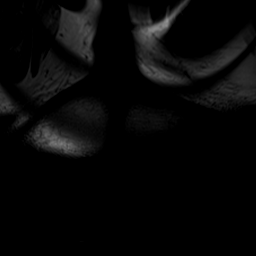
[im 8/40]
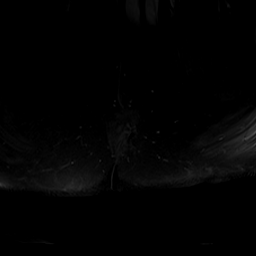
[im 16/40]
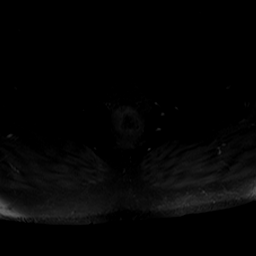
[im 24/40]
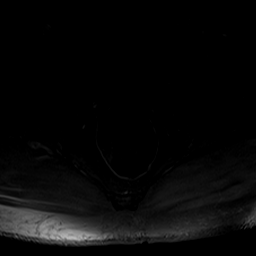
[im 32/40]
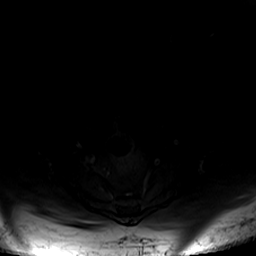
[im 40/40]
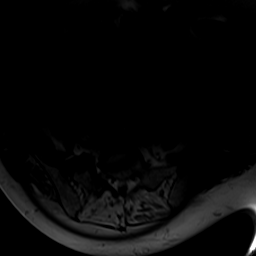

[Series 9: T1 fat-sat post-contrast · axial · 5.0mm · 0.96mm/px · z∈[-98,+105]mm · 6 of 40 slices shown (1 of 2)]
[im 1/40]
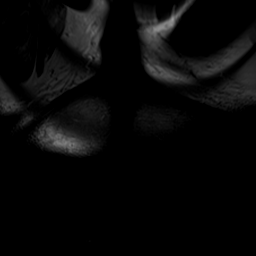
[im 8/40]
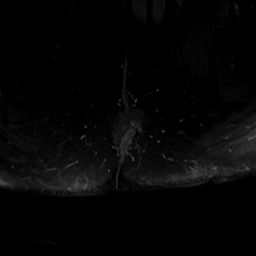
[im 16/40]
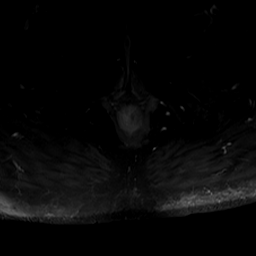
[im 24/40]
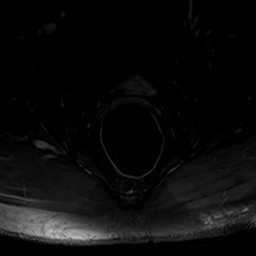
[im 32/40]
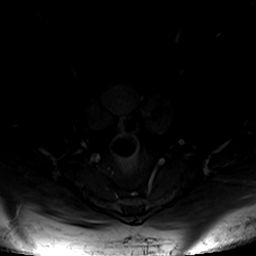
[im 40/40]
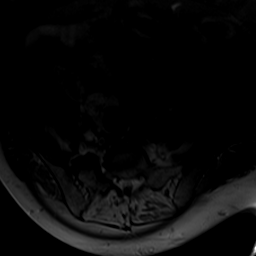

[Series 10: T1 fat-sat post-contrast · coronal · 5.0mm · 1.22mm/px · 5 of 33 slices shown (2 of 2)]
[im 1/33]
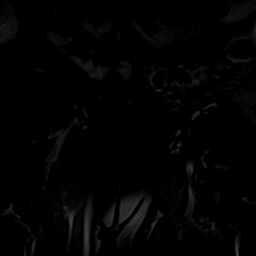
[im 9/33]
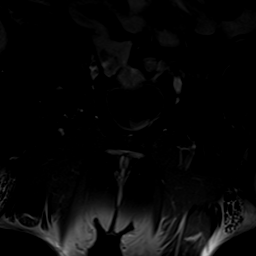
[im 17/33]
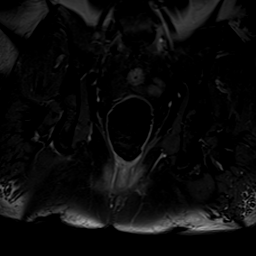
[im 25/33]
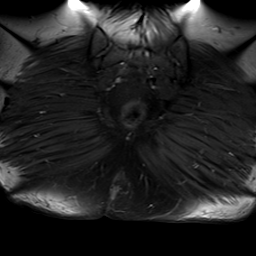
[im 33/33]
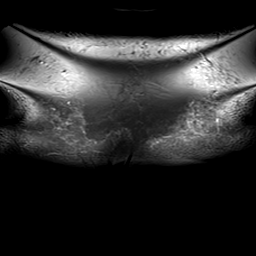

[48 of 48 positions shown; findings below may reference images not displayed]

FINDINGS: Urinary Tract:  Bladder is within normal limits.

Bowel:  Visualized bowel is unremarkable.

However, as noted on CT, there is a perianal fistula extending from
the [DATE] position (series 6/image 31), extending along the medial
left gluteal fold (series 6/image 33), and exiting via the left
gluteal cleft. This reflects a grade 3 transsphincteric fistula
without associated abscess.

Vascular/Lymphatic: No evidence of aneurysm.

No suspicious pelvic lymphadenopathy.

Reproductive:  Uterus is within normal limits.

Prominent bilateral ovaries related to history of PCOS.

Other:  No pelvic ascites.

Musculoskeletal: No focal osseous lesions.
IMPRESSION: Grade 3 transsphincteric perianal fistula arising from the [DATE]
position and exiting via the left gluteal cleft, as above. No
associated abscess.

## 2021-10-07 ENCOUNTER — Ambulatory Visit: Payer: Medicare Other | Admitting: Family Medicine

## 2021-10-13 ENCOUNTER — Ambulatory Visit (HOSPITAL_COMMUNITY): Payer: Medicare Other | Admitting: Psychiatry

## 2021-10-14 ENCOUNTER — Other Ambulatory Visit: Payer: Self-pay

## 2021-10-14 ENCOUNTER — Ambulatory Visit (HOSPITAL_BASED_OUTPATIENT_CLINIC_OR_DEPARTMENT_OTHER): Payer: Medicare Other | Admitting: Psychiatry

## 2021-10-14 DIAGNOSIS — F323 Major depressive disorder, single episode, severe with psychotic features: Secondary | ICD-10-CM | POA: Diagnosis not present

## 2021-10-14 DIAGNOSIS — F324 Major depressive disorder, single episode, in partial remission: Secondary | ICD-10-CM | POA: Diagnosis not present

## 2021-10-14 MED ORDER — DULOXETINE HCL 60 MG PO CPEP: 120.0000 mg | ORAL_CAPSULE | Freq: Every day | ORAL | 4 refills | Status: DC

## 2021-10-14 MED ORDER — BUPROPION HCL ER (XL) 150 MG PO TB24: 450.0000 mg | ORAL_TABLET | Freq: Every day | ORAL | 5 refills | Status: DC

## 2021-10-14 MED ORDER — TRAZODONE HCL 100 MG PO TABS: 400.0000 mg | ORAL_TABLET | Freq: Every day | ORAL | 1 refills | Status: DC

## 2021-10-14 MED ORDER — CLONAZEPAM 1 MG PO TABS: ORAL_TABLET | ORAL | 4 refills | Status: DC

## 2021-10-14 MED ORDER — ARIPIPRAZOLE 10 MG PO TABS: ORAL_TABLET | ORAL | 5 refills | Status: DC

## 2021-10-14 NOTE — Progress Notes (Signed)
Patient ID: Jamie [name], female   DOB: 1971-02-06, 51 y.o.   MRN: [id number] ?[facility] MD Progress Note ? ?10/14/2021 2:28 PM ?Jamie [name]  ?MRN:  [id number] ?Subjective:  Feeling well. ?Principal Problem: Major depression recurrent ? ?Today the patient is doing fairly well.  She drove here from her home.  Her husband [name] stayed in the car.  Overall her mood is fairly good.  She still watches TV and she likes to read and she likes her dog whose name is [place].  The patient takes her medicines as prescribed.  She denies being sad or depressed.  She denies being suicidal.  She denies any psychotic symptomatology.  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  We talked about the importance of having purpose and passion and doing something with herself.  The patient says her anxiety is reasonably controlled.  She has had no falls.  Her health is fairly good.  She has lost some weight but it was only because she had an acute GI illness.  The illness even occurred up to yesterday but today she is not vomited and she feels a little bit less nauseated. ?Virtual Visit via Telephone Note ? ?I connected with Jamie [name] on 05/13/2021 at  2:00 PM EDT by telephone and verified that I am speaking with the correct person using two identifiers. ? ?Location: ?Patient: home ?Provider: office ?  ?I discussed the limitations, risks, security and privacy concerns of performing an evaluation and management service by telephone and the availability of in person appointments. I also discussed with the patient that there may be a patient responsible charge related to this service. The patient expressed understanding and agreed to proceed. ? ?  ?I discussed the assessment and treatment plan with the patient. The patient was provided an opportunity to ask questions and all were answered. The patient agreed with the plan and demonstrated an understanding of the instructions. ?  ?The  patient was advised to call back or seek an in-person evaluation if the symptoms worsen or if the condition fails to improve as anticipated. ? ?I provided 30 minutes of non-face-to-face time during this encounter. ? ? ?[name], MD  ? ?Past Medical History:  ?Diagnosis Date  ? Anxiety   ? Arthritis   ? knees  ? Bipolar 1 disorder (HCC)   ? Chronic diarrhea   ? Diverticulosis of colon   ? GERD (gastroesophageal reflux disease)   ? Hiatal hernia   ? History of concussion   ? per pt 11/ 2020 w/ no residual  ? Hyperlipidemia   ? Hypertension   ? followed by pcp   (11-29-2019  per pt stated never had stress test)  ? MDD (major depressive disorder)   ? Migraine   ? neurologist--- dr Terrace Arabia  ? OSA on CPAP   ? followed by dr dohmeier  ? PCOS (polycystic ovarian syndrome)   ? PONV (postoperative nausea and vomiting)   ? Type 2 diabetes mellitus (HCC)   ? followed by pcp   (11-29-2019  pt stated checks blood sugar daily in am,  fasting sugar--- 96  ?  ?Past Surgical History:  ?Procedure Laterality Date  ? ANAL FISSURE REPAIR  x3  last one  2007  approx.  ? BIOPSY  09/06/2019  ? Procedure: BIOPSY;  Surgeon: [name], MD;  [facility]: [facility];  Service: Endoscopy;;  ? COLONOSCOPY WITH PROPOFOL [name] 09/06/2019  ? pancolonic  diverticulosis. Segmental biopsy completed with benign biopsies.   ? ESOPHAGOGASTRODUODENOSCOPY (EGD) WITH PROPOFOL [name] 06/16/2020  ? Dr. [name]: normal esophagus s/p dilation, normal stomach, duodenum.   ? EVALUATION UNDER ANESTHESIA WITH HEMORRHOIDECTOMY [name] 12/05/2019  ? Procedure: ANORECTAL EXAM UNDER ANESTHESIA WITH HEMORRHOIDECTOMY, HEMORRHOIDAL LIGATION/PEXY;  Surgeon: [name], MD;  Location: [facility];  Service: General;  Laterality: [name];  GENERAL AND LOCAL  ? EVALUATION UNDER ANESTHESIA WITH TEAR DUCT PROBING  infant  ? KNEE ARTHROSCOPY Right 1994  ? LAPAROSCOPIC CHOLECYSTECTOMY  08-06-2002  @[facility]   ? LUMBAR LAMINECTOMY/DECOMPRESSION MICRODISCECTOMY Right 01/22/2014  ?  Procedure: LUMBAR FOUR TO FIVE LUMBAR LAMINECTOMY/DECOMPRESSION MICRODISCECTOMY 1 LEVEL;  Surgeon: 01/24/2014, MD;  Location: [facility];  Service: Neurosurgery;  Laterality: Right;  Right L45 diskectomy  ? MALONEY DILATION [name] 06/16/2020  ? Procedure: MALONEY DILATION;  Surgeon: 06/18/2020, MD;  Location: [facility];  Service: Endoscopy;  Laterality: [name];  ? ?Family History:  ?Family History  ?Problem Relation Age of Onset  ? COPD Father   ? Anxiety disorder Maternal Grandmother   ? Depression Maternal Grandmother   ? Colon cancer Neg Hx   ? Colon polyps Neg Hx   ? ?Family Psychiatric  History:  ?Social History:  ?Social History  ? ?Substance and Sexual Activity  ?Alcohol Use No  ?   ?Social History  ? ?Substance and Sexual Activity  ?Drug Use Never  ?  ?Social History  ? ?Socioeconomic History  ? Marital status: Married  ?  Spouse name: Not on file  ? Number of children: 0  ? Years of education: college  ? Highest education level: Not on file  ?Occupational History  ? Occupation: disabled  ?Tobacco Use  ? Smoking status: Never  ? Smokeless tobacco: Never  ?Vaping Use  ? Vaping Use: Never used  ?Substance and Sexual Activity  ? Alcohol use: No  ? Drug use: Never  ? Sexual activity: Yes  ?  Partners: Male  ?  Birth control/protection: None  ?Other Topics Concern  ? Not on file  ?Social History Narrative  ? Patient lives at home alone   ? Patient is right handed  ? Patient drinks soda's daily  ? ?Social Determinants of Health  ? ?Financial Resource Strain: Not on file  ?Food Insecurity: Not on file  ?Transportation Needs: Not on file  ?Physical Activity: Not on file  ?Stress: Not on file  ?Social Connections: Not on file  ? ?Additional Social History:  ?  ?  ?  ?  ?  ?  ?  ?  ?  ?  ?  ? ?Sleep: Fair ? ?Appetite:  Good ? ?Current Medications: ?Current Outpatient Medications  ?Medication Sig Dispense Refill  ? ACCU-CHEK AVIVA PLUS test strip     ? Accu-Chek Softclix Lancets lancets AS DIRECTED TO CHECK  BLOOD SUGAR ONCE TO TWICE DAILY    ? albuterol (VENTOLIN HFA) 108 (90 Base) MCG/ACT inhaler INHALE TWO PUFFS BY MOUTH EVERY 6 HOURS AS NEEDED FOR WHEEZING OR SHORTNESS OF BREATH (Patient taking differently: Inhale 2 puffs into the lungs every 6 (six) hours as needed for wheezing or shortness of breath.) 8.5 g 1  ? ARIPiprazole (ABILIFY) 10 MG tablet 2 qam 60 tablet 5  ? buPROPion (WELLBUTRIN XL) 150 MG 24 hr tablet Take 3 tablets (450 mg total) by mouth daily. 90 tablet 5  ? clonazePAM (KLONOPIN) 1 MG tablet Take 1 mg in the morning and 2 mg at  night 90 tablet 4  ? diclofenac Sodium (VOLTAREN) 1 % GEL Apply 1 application topically 4 (four) times daily as needed (pain).    ? dicyclomine (BENTYL) 10 MG capsule TAKE ONE CAPSULE BY MOUTH FOUR TIMES DAILY BEFORE MEALS AND AT BEDTIME 120 capsule 3  ? DULoxetine (CYMBALTA) 60 MG capsule Take 2 capsules (120 mg total) by mouth at bedtime. 60 capsule 4  ? FIBER PO Take 1 capsule by mouth daily.     ? fluticasone (FLONASE) 50 MCG/ACT nasal spray Place 1 spray into both nostrils at bedtime as needed for allergies or rhinitis.     ? gabapentin (NEURONTIN) 100 MG capsule Take 200 mg by mouth at bedtime.     ? lisinopril (PRINIVIL,ZESTRIL) 5 MG tablet Take 5 mg by mouth daily.    ? metFORMIN (GLUCOPHAGE-XR) 500 MG 24 hr tablet Take 500 mg by mouth daily.     ? naproxen sodium (ALEVE) 220 MG tablet Take 440 mg by mouth 2 (two) times daily as needed (pain).     ? omeprazole-sodium bicarbonate (ZEGERID) 40-1100 MG capsule TAKE ONE CAPSULE BY MOUTH TWICE DAILY BEFORE BREAKFAST AND BEFORE SUPPER 60 capsule 5  ? ondansetron (ZOFRAN-ODT) 4 MG disintegrating tablet TAKE 1 TABLET BY MOUTH EVERY 8 HOURS AS NEEDED FOR NAUSEA AND VOMITING 30 tablet 1  ? rosuvastatin (CRESTOR) 20 MG tablet Take 10 mg by mouth daily.    ? Semaglutide, 1 MG/DOSE, (OZEMPIC, 1 MG/DOSE,) 2 MG/1.5ML SOPN Inject 1 mg into the skin every Monday.    ? traZODone (DESYREL) 100 MG tablet Take 4 tablets (400 mg total) by  mouth at bedtime. 360 tablet 1  ? ?No current facility-administered medications for this visit.  ? ? ?Lab Results: No results found for this or any previous visit (from the past 48 hour(s)). ? ?Physical F

## 2021-10-28 ENCOUNTER — Encounter: Payer: Self-pay | Admitting: Nutrition

## 2021-10-28 ENCOUNTER — Encounter: Payer: Medicare Other | Attending: General Practice | Admitting: Nutrition

## 2021-10-28 VITALS — Ht 63.0 in | Wt 229.0 lb

## 2021-10-28 DIAGNOSIS — Z7984 Long term (current) use of oral hypoglycemic drugs: Secondary | ICD-10-CM | POA: Diagnosis not present

## 2021-10-28 DIAGNOSIS — E1165 Type 2 diabetes mellitus with hyperglycemia: Secondary | ICD-10-CM

## 2021-10-28 DIAGNOSIS — Z794 Long term (current) use of insulin: Secondary | ICD-10-CM | POA: Diagnosis not present

## 2021-10-28 DIAGNOSIS — E781 Pure hyperglyceridemia: Secondary | ICD-10-CM

## 2021-10-28 DIAGNOSIS — E119 Type 2 diabetes mellitus without complications: Secondary | ICD-10-CM | POA: Diagnosis present

## 2021-10-28 DIAGNOSIS — Z713 Dietary counseling and surveillance: Secondary | ICD-10-CM | POA: Diagnosis not present

## 2021-10-28 DIAGNOSIS — Z79899 Other long term (current) drug therapy: Secondary | ICD-10-CM | POA: Diagnosis not present

## 2021-10-28 NOTE — Patient Instructions (Addendum)
Goals ? ?Eat three meals per day ?Watch portion sizes ?Don't skip lunch ?Increase fresh fruits, vegetables plant based foods. ?Take medications as prescribed. ?Drink a gallon of water daily. ? ? ?

## 2021-10-28 NOTE — Progress Notes (Signed)
Medical Nutrition Therapy  ?Appointment Start time:  3016  Appointment End time:  1630 ? ?Primary concerns today: DM Type 2, Obesity  ?Referral diagnosis: E11.8, E66.9 ?Preferred learning style: No preference. ?Learning readiness: Ready  ? ? ?NUTRITION ASSESSMENT  ?Lives with her husband. Her husband does the cooking and shopping. ?Walks some but not daily. Consistent with her medications. ?Lost 31 lbs in the last 4 yrs. ?Is on ozempic, jardiance, metformin and lantus. ? ?BS now are running 120-150's now. ? ?She is committed to make changes with her lifestyle Medicine. ?She feels better since her BS are better. ? ?Current diet is insuffient to meet her needs due to skipping lunch daily. ? ?Anthropometrics  ?Wt Readings from Last 3 Encounters:  ?10/30/20 227 lb (103 kg)  ?06/16/20 244 lb 0.8 oz (110.7 kg)  ?06/12/20 244 lb (110.7 kg)  ? ?Ht Readings from Last 3 Encounters:  ?10/30/20 _0  (1.6 m)  ?06/16/20 _1  (1.6 m)  ?06/12/20 _2  (1.6 m)  ? ?There is no height or weight on file to calculate BMI. ?_3 @ ?Facility age limit for growth percentiles is 20 years. ?Facility age limit for growth percentiles is 20 years. ?  ? ?Clinical ?Medical Hx: See chart ?Medications: Lantus 10 units, Jardiance 25 mg , Ozempic and Metformin er 500 mg a day ?Labs: A1C 9.7% ?Notable Signs/Symptoms: None right now ? ?Lifestyle & Dietary Hx ?LIves with her husband. He cooks ?Lost family members and has been depressed. Sees a therapist and psychiatrist ? ?Estimated daily fluid intake: 60 oz water ?Supplements: none ?Sleep: 9 ?Stress / self-care: stress of losing family member ?Current average weekly physical activity: ADL ? ?24-Hr Dietary Recall ?First Meal: Packaged oatmeal with apples, water ?Snack: water ?Second Meal: skipped ?Snack:  ?Third Meal: Grilled chicken, green beand and mac/cheese, water ?Snack: water ?Beverages: water ? ?Estimated Energy Needs ?Calories: 1200 ?Carbohydrate: 135g ?Protein: 90g ?Fat: 33g ? ? ?NUTRITION  DIAGNOSIS  ?NB-1.1 Food and nutrition-related knowledge deficit As related to Diabetes Type 2.  As evidenced by A1C 9.7%. ? ? ?NUTRITION INTERVENTION  ?Nutrition education (E-1) on the following topics:  ?.Nutrition and Diabetes education provided on My Plate, CHO counting, meal planning, portion sizes, timing of meals, avoiding snacks between meals unless having a low blood sugar, target ranges for A1C and blood sugars, signs/symptoms and treatment of hyper/hypoglycemia, monitoring blood sugars, taking medications as prescribed, benefits of exercising 30 minutes per day and prevention of complications of DM. ? ?Lifestyle Medicine ?- Whole Food, Plant Predominant Nutrition is highly recommended: Eat Plenty of vegetables, Mushrooms, fruits, Legumes, Whole Grains, Nuts, seeds in lieu of processed meats, processed snacks/pastries red meat, poultry, eggs.  ?  ?-It is better to avoid simple carbohydrates including: Cakes, Sweet Desserts, Ice Cream, Soda (diet and regular), Sweet Tea, Candies, Chips, Cookies, Store Bought Juices, Alcohol in Excess of  1-2 drinks a day, Lemonade,  Artificial Sweeteners, Doughnuts, Coffee Creamers, "Sugar-free" Products, etc, etc.  This is not a complete list..... ? ?Exercise: If you are able: 30 -60 minutes a day ,4 days a week, or 150 minutes a week.  The longer the better.  Combine stretch, strength, and aerobic activities.  If you were told in the past that you have high risk for cardiovascular diseases, you may seek evaluation by your heart doctor prior to initiating moderate to intense exercise programs. ? ? ?Handouts Provided Include  ?LIfestyle Medicine ?Lifestyle Nutrition ?Meal Plan Card ? ?Learning Style & Readiness for Change ?Teaching method utilized:  Visual & Auditory  ?Demonstrated degree of understanding via: Teach Back  ?Barriers to learning/adherence to lifestyle change: none ? ?Goals Established by Pt ?Goals ? ?Eat three meals per day ?Watch portion sizes ?Don't skip  lunch ?Increase fresh fruits, vegetables plant based foods. ?Take medications as prescribed. ?Drink a gallon of water daily. ? ? ?MONITORING & EVALUATION ?Dietary intake, weekly physical activity, and blood sugars in 1 month. ? ?Next Steps  ?Patient is to work on meal planning. ? ?

## 2021-11-17 ENCOUNTER — Ambulatory Visit: Payer: Medicare Other | Admitting: Family Medicine

## 2021-11-26 ENCOUNTER — Ambulatory Visit: Payer: Medicare Other | Admitting: Adult Health

## 2021-12-01 NOTE — Progress Notes (Deleted)
Referring Provider: Joeseph AmorBoyd, Amy H, PA-C Primary Care Physician:  Joeseph AmorBoyd, Amy H, PA-C Primary GI Physician: Dr. Jena Gaussourk  No chief complaint on file.   HPI:   Jamie [name] General is a 51 y.o. female with history of chronic diarrhea, chronic epigastric abdominal pain, nausea, and GERD. Stool studies including culture, O&P, and C. difficile negative in October 2020.  Colonoscopy with negative segmental biopsies in February 2021. Also with suspected perianal fistula on CT/MRI in Feb/March 2021 s/p internal and external hemorrhoidectomy x2, internal hemorrhoid ligation and pexy, and anorectal exam under anesthesia on 12/05/19.  No evidence of fissure, fistula,or abscess. She is presenting today for follow-up of upper abdominal pain and dysphagia s/p EGD. ***  She was last seen in our office 03/20/2020.  She reported constant epigastric discomfort that was worsened by eating with associated nausea without vomiting and early satiety.  Symptoms began around November 2020.  GERD well controlled on Protonix 40 mg daily.  Intermittent solid food dysphagia.  Diarrhea improved with 1-2 soft, formed BMs daily taking Bentyl 3 times daily before meals and at bedtime.  Previously took pancreatic enzymes for about 3 months which she also felt helped, but ended up with constipation when combined with Bentyl.  Feels she needs Bentyl as she had previously been struggling with a lot of abdominal cramping that Bentyl has resolved. She was scheduled for EGD.   EGD 06/16/2020: Esophagus, stomach, and examined duodenum entirely normal s/p empiric esophageal dilation.  Advised to stop Protonix and start Zegerid 40 mg twice daily.   Today:    Past Medical History:  Diagnosis Date   Anxiety    Arthritis    knees   Bipolar 1 disorder (HCC)    Chronic diarrhea    Diverticulosis of colon    GERD (gastroesophageal reflux disease)    Hiatal hernia    History of concussion    per pt 11/ 2020 w/ no residual   Hyperlipidemia     Hypertension    followed by pcp   (11-29-2019  per pt stated never had stress test)   MDD (major depressive disorder)    Migraine    neurologist--- dr Terrace Arabiayan   OSA on CPAP    followed by dr dohmeier   PCOS (polycystic ovarian syndrome)    PONV (postoperative nausea and vomiting)    Type 2 diabetes mellitus (HCC)    followed by pcp   (11-29-2019  pt stated checks blood sugar daily in am,  fasting sugar--- 96    Past Surgical History:  Procedure Laterality Date   ANAL FISSURE REPAIR  x3  last one  2007  approx.   BIOPSY  09/06/2019   Procedure: BIOPSY;  Surgeon: Corbin Adeourk, Robert M, MD;  Location: [facility];  Service: Endoscopy;;   COLONOSCOPY WITH PROPOFOL [name] 09/06/2019   pancolonic diverticulosis. Segmental biopsy completed with benign biopsies.    ESOPHAGOGASTRODUODENOSCOPY (EGD) WITH PROPOFOL [name] 06/16/2020   Dr. Jena Gaussourk: normal esophagus s/p dilation, normal stomach, duodenum.    EVALUATION UNDER ANESTHESIA WITH HEMORRHOIDECTOMY [name] 12/05/2019   Procedure: ANORECTAL EXAM UNDER ANESTHESIA WITH HEMORRHOIDECTOMY, HEMORRHOIDAL LIGATION/PEXY;  Surgeon: Karie SodaGross, Steven, MD;  Location: [facility];  Service: General;  Laterality: [name];  GENERAL AND LOCAL   EVALUATION UNDER ANESTHESIA WITH TEAR DUCT PROBING  infant   KNEE ARTHROSCOPY Right 1994   LAPAROSCOPIC CHOLECYSTECTOMY  08-06-2002  @[facility]    LUMBAR LAMINECTOMY/DECOMPRESSION MICRODISCECTOMY Right 01/22/2014   Procedure: LUMBAR FOUR TO FIVE LUMBAR LAMINECTOMY/DECOMPRESSION MICRODISCECTOMY 1  LEVEL;  Surgeon: Karn Cassis, MD;  Location: [facility];  Service: Neurosurgery;  Laterality: Right;  Right L45 diskectomy   MALONEY DILATION [name] 06/16/2020   Procedure: Elease [name] DILATION;  Surgeon: [name], MD;  Location: [facility];  Service: Endoscopy;  Laterality: [name];    Current Outpatient Medications  Medication Sig Dispense Refill   ACCU-CHEK AVIVA PLUS test strip      Accu-Chek Softclix Lancets lancets AS DIRECTED TO  CHECK BLOOD SUGAR ONCE TO TWICE DAILY     albuterol (VENTOLIN HFA) 108 (90 Base) MCG/ACT inhaler INHALE TWO PUFFS BY MOUTH EVERY 6 HOURS AS NEEDED FOR WHEEZING OR SHORTNESS OF BREATH (Patient taking differently: Inhale 2 puffs into the lungs every 6 (six) hours as needed for wheezing or shortness of breath.) 8.5 g 1   ARIPiprazole (ABILIFY) 10 MG tablet 2 qam 60 tablet 5   buPROPion (WELLBUTRIN XL) 150 MG 24 hr tablet Take 3 tablets (450 mg total) by mouth daily. 90 tablet 5   clonazePAM (KLONOPIN) 1 MG tablet Take 1 mg in the morning and 2 mg at night 90 tablet 4   diclofenac Sodium (VOLTAREN) 1 % GEL Apply 1 application topically 4 (four) times daily as needed (pain).     dicyclomine (BENTYL) 10 MG capsule TAKE ONE CAPSULE BY MOUTH FOUR TIMES DAILY BEFORE MEALS AND AT BEDTIME (Patient not taking: Reported on 10/28/2021) 120 capsule 3   DULoxetine (CYMBALTA) 60 MG capsule Take 2 capsules (120 mg total) by mouth at bedtime. 60 capsule 4   empagliflozin (JARDIANCE) 25 MG TABS tablet Take by mouth daily.     fenofibrate micronized (LOFIBRA) 134 MG capsule Take 134 mg by mouth daily before breakfast.     FIBER PO Take 1 capsule by mouth daily.      fluticasone (FLONASE) 50 MCG/ACT nasal spray Place 1 spray into both nostrils at bedtime as needed for allergies or rhinitis.      gabapentin (NEURONTIN) 100 MG capsule Take 200 mg by mouth at bedtime.      insulin glargine (LANTUS) 100 UNIT/ML injection Inject 10 Units into the skin daily.     lisinopril (PRINIVIL,ZESTRIL) 5 MG tablet Take 5 mg by mouth daily.     metFORMIN (GLUCOPHAGE-XR) 500 MG 24 hr tablet Take 500 mg by mouth daily.      naproxen sodium (ALEVE) 220 MG tablet Take 440 mg by mouth 2 (two) times daily as needed (pain).  (Patient not taking: Reported on 10/28/2021)     omeprazole-sodium bicarbonate (ZEGERID) 40-1100 MG capsule TAKE ONE CAPSULE BY MOUTH TWICE DAILY BEFORE BREAKFAST AND BEFORE SUPPER 60 capsule 5   ondansetron (ZOFRAN-ODT) 4 MG  disintegrating tablet TAKE 1 TABLET BY MOUTH EVERY 8 HOURS AS NEEDED FOR NAUSEA AND VOMITING 30 tablet 1   rosuvastatin (CRESTOR) 20 MG tablet Take 10 mg by mouth daily.     Semaglutide, 1 MG/DOSE, (OZEMPIC, 1 MG/DOSE,) 2 MG/1.5ML SOPN Inject 1 mg into the skin every Monday.     traZODone (DESYREL) 100 MG tablet Take 4 tablets (400 mg total) by mouth at bedtime. 360 tablet 1   No current facility-administered medications for this visit.    Allergies as of 12/03/2021 - Review Complete 10/28/2021  Allergen Reaction Noted   Codeine Hives and Swelling 06/07/2011   Morphine and related Swelling and Rash 01/21/2014    Family History  Problem Relation Age of Onset   COPD Father    Anxiety disorder Maternal Grandmother  Depression Maternal Grandmother    Colon cancer Neg Hx    Colon polyps Neg Hx     Social History   Socioeconomic History   Marital status: Married    Spouse name: Not on file   Number of children: 0   Years of education: college   Highest education level: Not on file  Occupational History   Occupation: disabled  Tobacco Use   Smoking status: Never   Smokeless tobacco: Never  Vaping Use   Vaping Use: Never used  Substance and Sexual Activity   Alcohol use: No   Drug use: Never   Sexual activity: Yes    Partners: Male    Birth control/protection: None  Other Topics Concern   Not on file  Social History Narrative   Patient lives at home alone    Patient is right handed   Patient drinks soda's daily   Social Determinants of Health   Financial Resource Strain: Not on file  Food Insecurity: Not on file  Transportation Needs: Not on file  Physical Activity: Not on file  Stress: Not on file  Social Connections: Not on file    Review of Systems: Gen: Denies fever, chills,cold or flu like symptoms, pre-syncope, or syncope.  CV: Denies chest pain, palpitations. Resp: Denies dyspnea or cough.  GI: See HPI Heme: See HPI  Physical Exam: There were no  vitals taken for this visit. General:   Alert and oriented. No distress noted. Pleasant and cooperative.  Head:  Normocephalic and atraumatic. Eyes:  Conjuctiva clear without scleral icterus. Heart:  S1, S2 present without murmurs appreciated. Lungs:  Clear to auscultation bilaterally. No wheezes, rales, or rhonchi. No distress.  Abdomen:  +BS, soft, non-tender and non-distended. No rebound or guarding. No HSM or masses noted. Msk:  Symmetrical without gross deformities. Normal posture. Extremities:  Without edema. Neurologic:  Alert and  oriented x4 Psych:  Normal mood and affect.    Assessment:     Plan:  ***   Ermalinda Memos, PA-C [facility] 12/03/2021

## 2021-12-02 ENCOUNTER — Encounter: Payer: Self-pay | Admitting: Nutrition

## 2021-12-02 ENCOUNTER — Encounter: Payer: Medicare Other | Attending: General Practice | Admitting: Nutrition

## 2021-12-02 VITALS — Ht 63.0 in | Wt 228.0 lb

## 2021-12-02 DIAGNOSIS — E1165 Type 2 diabetes mellitus with hyperglycemia: Secondary | ICD-10-CM | POA: Insufficient documentation

## 2021-12-02 DIAGNOSIS — E118 Type 2 diabetes mellitus with unspecified complications: Secondary | ICD-10-CM | POA: Diagnosis present

## 2021-12-02 DIAGNOSIS — E781 Pure hyperglyceridemia: Secondary | ICD-10-CM | POA: Insufficient documentation

## 2021-12-02 NOTE — Progress Notes (Signed)
Medical Nutrition Therapy Dm Follow up ?Appointment Start time:  1415  Appointment End time:  1445 ?Primary concerns today: DM Type 2, Obesity  ?Referral diagnosis: E11.8, E66.9 ?Preferred learning style: No preference. ?Learning readiness: Ready  ? ? ?NUTRITION ASSESSMENT  ?Changes made:  ?Has been walking more, 20 minutes, increased from 15 minutes. ?Has been eating more vegetables and fresh fruits. ?Has cut down on bread, french fries,  ? ?Metformin 500 mg ER once a day, 10 units of Lantus daily., Ozempic 1 mg weekly. ? ?Has had issues with cost of her medicaitons. Her pharmacist is working on getting financial help with them. ? ?She is committed to make changes with her lifestyle Medicine. ?She feels better since her BS are better. ? ? ?FBS 115-130's Bedtime 130-170's. BS are much improved. ?She is drinking water and trying not to eat after 7 pm, but her family makes it hard sometimes. ? ?Is on Ozempic but hasn't had any results of weight loss yet. ? ?30 Day avg BS 150's. ? ?Anthropometrics  ?Wt Readings from Last 3 Encounters:  ?10/28/21 229 lb (103.9 kg)  ?10/30/20 227 lb (103 kg)  ?06/16/20 244 lb 0.8 oz (110.7 kg)  ? ?Ht Readings from Last 3 Encounters:  ?10/28/21 5\' 3"  (1.6 m)  ?10/30/20 5\' 3"  (1.6 m)  ?06/16/20 5\' 3"  (1.6 m)  ? ?There is no height or weight on file to calculate BMI. ?@BMIFA @ ?Facility age limit for growth percentiles is 20 years. ?Facility age limit for growth percentiles is 20 years. ?  ? ?Clinical ?Medical Hx: See chart ?Medications: Lantus 10 units, Jardiance 25 mg , Ozempic and Metformin er 500 mg a day ?Labs: A1C 9.7% ?Notable Signs/Symptoms: None right now ? ?Lifestyle & Dietary Hx ?LIves with her husband. He cooks ?Lost family members and has been depressed. Sees a therapist and psychiatrist ? ?Estimated daily fluid intake: 60 oz water ?Supplements: none ?Sleep: 9 ?Stress / self-care: stress of losing family member ?Current average weekly physical activity: ADL ? ?24-Hr Dietary  Recall ?First Meal: Oatmeal old fashion with blueberries, water ?Snack: water ?Second Meal: Banana sandwich on wheat bread, Diet sundrop ?Snack:  ?Third Meal: Grilled chicken salad, with dressing, Water ? ?Beverages: water ? ?Estimated Energy Needs ?Calories: 1200 ?Carbohydrate: 135g ?Protein: 90g ?Fat: 33g ? ? ?NUTRITION DIAGNOSIS  ?NB-1.1 Food and nutrition-related knowledge deficit As related to Diabetes Type 2.  As evidenced by A1C 9.7%. ? ? ?NUTRITION INTERVENTION  ?Nutrition education (E-1) on the following topics:  ?.Nutrition and Diabetes education provided on My Plate, CHO counting, meal planning, portion sizes, timing of meals, avoiding snacks between meals unless having a low blood sugar, target ranges for A1C and blood sugars, signs/symptoms and treatment of hyper/hypoglycemia, monitoring blood sugars, taking medications as prescribed, benefits of exercising 30 minutes per day and prevention of complications of DM. ? ?Lifestyle Medicine ?- Whole Food, Plant Predominant Nutrition is highly recommended: Eat Plenty of vegetables, Mushrooms, fruits, Legumes, Whole Grains, Nuts, seeds in lieu of processed meats, processed snacks/pastries red meat, poultry, eggs.  ?  ?-It is better to avoid simple carbohydrates including: Cakes, Sweet Desserts, Ice Cream, Soda (diet and regular), Sweet Tea, Candies, Chips, Cookies, Store Bought Juices, Alcohol in Excess of  1-2 drinks a day, Lemonade,  Artificial Sweeteners, Doughnuts, Coffee Creamers, "Sugar-free" Products, etc, etc.  This is not a complete list..... ? ?Exercise: If you are able: 30 -60 minutes a day ,4 days a week, or 150 minutes a week.  The longer  the better.  Combine stretch, strength, and aerobic activities.  If you were told in the past that you have high risk for cardiovascular diseases, you may seek evaluation by your heart doctor prior to initiating moderate to intense exercise programs. ? ? ?Handouts Provided Include  ?LIfestyle  Medicine ?Lifestyle Nutrition ?Meal Plan Card ? ?Learning Style & Readiness for Change ?Teaching method utilized: Visual & Auditory  ?Demonstrated degree of understanding via: Teach Back  ?Barriers to learning/adherence to lifestyle change: none ? ?Goals Established by Pt ?Goals ? ?Eat three meals per day ?Watch portion sizes ?Don't skip lunch ?Increase fresh fruits, vegetables plant based foods. ?Drink a gallon of water daily. ?      Be sure to eat before 7 pm. ?      Increase exercise to 30 minutes when you can. ?    Get A1C down to 7% or less. ? ? ?MONITORING & EVALUATION ?Dietary intake, weekly physical activity, and blood sugars in 1 month. ?Recommend to consider stopping Jardiance now that she is on Ozempic. ? ?Next Steps  ?Patient is to work on meal planning. ? ?

## 2021-12-02 NOTE — Patient Instructions (Signed)
Goals Established by Pt ?Goals ? ?Eat three meals per day ?Watch portion sizes ?Don't skip lunch ?Increase fresh fruits, vegetables plant based foods. ?Drink a gallon of water daily. ?      Be sure to eat before 7 pm. ?      Increase exercise to 30 minutes when you can. ?    Get A1C down to 7% or less. ?

## 2021-12-03 ENCOUNTER — Encounter: Payer: Self-pay | Admitting: Adult Health

## 2021-12-03 ENCOUNTER — Ambulatory Visit: Payer: Medicare Other | Admitting: Gastroenterology

## 2021-12-03 ENCOUNTER — Telehealth: Payer: Self-pay | Admitting: Adult Health

## 2021-12-03 ENCOUNTER — Ambulatory Visit: Payer: Medicare Other | Admitting: Adult Health

## 2021-12-03 VITALS — BP 102/67 | HR 83 | Ht 63.0 in | Wt 228.1 lb

## 2021-12-03 DIAGNOSIS — Z91199 Patient's noncompliance with other medical treatment and regimen due to unspecified reason: Secondary | ICD-10-CM | POA: Diagnosis not present

## 2021-12-03 DIAGNOSIS — G4733 Obstructive sleep apnea (adult) (pediatric): Secondary | ICD-10-CM | POA: Diagnosis not present

## 2021-12-03 DIAGNOSIS — Z9989 Dependence on other enabling machines and devices: Secondary | ICD-10-CM

## 2021-12-03 DIAGNOSIS — G4452 New daily persistent headache (NDPH): Secondary | ICD-10-CM

## 2021-12-03 DIAGNOSIS — Z8669 Personal history of other diseases of the nervous system and sense organs: Secondary | ICD-10-CM | POA: Diagnosis not present

## 2021-12-03 MED ORDER — TOPIRAMATE 25 MG PO TABS: 25.0000 mg | ORAL_TABLET | Freq: Two times a day (BID) | ORAL | 5 refills | Status: DC

## 2021-12-03 NOTE — Progress Notes (Signed)
?[facility] ?[address] ?[place]. [place] [address] ?[phone] ? ?     OFFICE FOLLOW UP NOTE ? ?Ms. [name] ?Date of Birth:  02/26/1971 ?Medical Record Number:  [id number]  ? ? ?Reason for visit: headaches ? ? ? ?SUBJECTIVE: ? ? ?CHIEF COMPLAINT:  ?Chief Complaint  ?Patient presents with  ? Follow-up  ?  Rm 3 alone. Pt reports 10/06/21 she fell out of the shower and hit the left side of her head. Since this daily h/a have been present. She did not go to the ER for evaluation after hitting her head. Feels like CPAP is going well but has not been using it much.  ? ? ?HPI:  ? ?Update 12/03/2021 JM: Patient was previously seen by Dr. Brett Fairy over 1 year ago for which she was previously being followed for sleep apnea with CPAP noncompliance.  Also has history of migraine headaches previously followed by Dr. Krista Blue which were reported stable back in 2020 on topiramate but at prior visit with Dr. Brett Fairy, she self discontinued topiramate without any headaches.  Since prior visit, completed HST which showed mild OSA with AHI 8.4 and Dr. Brett Fairy recommended use of CPAP.  ? ? She requested today's visit due to worsening headaches - reports was doing well until 3/14 when she fell out of shower and hit her head on the left side, she has been having daily pressure type headaches since that time in the same location.  She denies any loss of consciousness, mental status changes, visual changes, N/V, or any other associated neurological symptoms.  She did not seek ER evaluation at that time. She reports seeing PCP the following day who thought it was reoccurrence of her chronic migraines, trialed her on Nurtec without benefit, did not do any head imaging.  Denies having any recent migraine headaches and her current headaches do not feel like her typical migraines.  ? ?Reports using CPAP machine but did obtain download report over the past 30 days which shows only 7 out of 30 usage days and only 4  days greater than 4 hours.  Residual AHI 0.8.  Reports tolerating CPAP well but due to her sleeping medication, she will fall asleep prior to placing mask on. ? ?She continues to be followed by behavioral health for schizoaffective disorder, Insomnia and anxiety currently on Abilify, Wellbutrin, Cymbalta, trazodone and Klonopin ? ? ? ? ? ? ?History provided for reference purposes only ?Update 10/30/2020 Dr. Brett Fairy: ?I was scheduled to have a revisit today with  Ms. Saira Drummonds. [place], who was last seen in a virtual visit  2 years ago by Frann Rider, by that time she had suboptimal compliance using the machine CPAP 8 out of 30 days 27%, there was no Epworth score obtained in that visit, Mallampati and neck size are not documented for that visit, the patient has received topiramate for headache management through Dr. Krista Blue and has discontinued to use topiramate-  No longer on migraine prevention. ?No longer having migraines.  ?She contracted COVID in February and was fairly sick, ( she had 2 moderna vaccines, no booster) hospitalized for 8 days at [facility]. Not ICU.  ? She had SEPSIS, bacteremia is documented, there was no room for her bed, she reports being several days in the ED-  ?  ?Since then she has more difficulties breathing which would be a primary pulmonary concern, she is more often short of breath she has remained very hoarse and she has remained very  congested.  She tells me that she was tested 4 times for Covid before the test returned positive and that she was treated for strep throat prior to that.  She transiently lost the smell of taste and smell but has recovered.  During that time everything tasted salty to her she was not appreciated of any kind of more nuanced taste or smells. She has had memory trouble since Covid, mistaking days and appointments, and location. She is walking  into a room and forgot what she came for.  ?  ?Her CPAP compliance has dropped form 27 % 2 years ago to less than 10 %-  I really don't think this is a compliance concern related to COVID 19, she lost 33 pounds with he illness.  She remains morbidly obese. Has been seen by cardiology and pulmonology.  ?She is  willing to under go new testing to see if she needs BIPAP now. ? ? ? ? ? ? ?ROS:   ?14 system review of systems performed and negative with exception of those listed in HPI ? ?PMH:  ?Past Medical History:  ?Diagnosis Date  ? Anxiety   ? Arthritis   ? knees  ? Bipolar 1 disorder ([place])   ? Chronic diarrhea   ? Diverticulosis of colon   ? GERD (gastroesophageal reflux disease)   ? Hiatal hernia   ? History of concussion   ? per pt 11/ 2020 w/ no residual  ? Hyperlipidemia   ? Hypertension   ? followed by pcp   (11-29-2019  per pt stated never had stress test)  ? MDD (major depressive disorder)   ? Migraine   ? neurologist--- dr Krista Blue  ? OSA on CPAP   ? followed by dr dohmeier  ? PCOS (polycystic ovarian syndrome)   ? PONV (postoperative nausea and vomiting)   ? Type 2 diabetes mellitus ([place])   ? followed by pcp   (11-29-2019  pt stated checks blood sugar daily in am,  fasting sugar--- 96  ? ? ?PSH:  ?Past Surgical History:  ?Procedure Laterality Date  ? ANAL FISSURE REPAIR  x3  last one  2007  approx.  ? BIOPSY  09/06/2019  ? Procedure: BIOPSY;  Surgeon: Daneil Dolin, MD;  [facility];  Service: Endoscopy;;  ? COLONOSCOPY WITH PROPOFOL [name] 09/06/2019  ? pancolonic diverticulosis. Segmental biopsy completed with benign biopsies.   ? ESOPHAGOGASTRODUODENOSCOPY (EGD) WITH PROPOFOL [name] 06/16/2020  ? Dr. Gala Romney: normal esophagus s/p dilation, normal stomach, duodenum.   ? EVALUATION UNDER ANESTHESIA WITH HEMORRHOIDECTOMY [name] 12/05/2019  ? Procedure: ANORECTAL EXAM UNDER ANESTHESIA WITH HEMORRHOIDECTOMY, HEMORRHOIDAL LIGATION/PEXY;  Surgeon: Michael Boston, MD;  Location: [place];  Service: General;  Laterality: [name];  GENERAL AND LOCAL  ? EVALUATION UNDER ANESTHESIA WITH TEAR DUCT PROBING  infant  ? KNEE  ARTHROSCOPY Right 1994  ? LAPAROSCOPIC CHOLECYSTECTOMY  08-06-2002  @[facility]   ? LUMBAR LAMINECTOMY/DECOMPRESSION MICRODISCECTOMY Right 01/22/2014  ? Procedure: LUMBAR FOUR TO FIVE LUMBAR LAMINECTOMY/DECOMPRESSION MICRODISCECTOMY 1 LEVEL;  Surgeon: Floyce Stakes, MD;  [facility]: [facility];  Service: Neurosurgery;  Laterality: Right;  Right L45 diskectomy  ? MALONEY DILATION [name] 06/16/2020  ? Procedure: MALONEY DILATION;  Surgeon: Daneil Dolin, MD;  [facility];  Service: Endoscopy;  Laterality: [name];  ? ? ?Social History:  ?Social History  ? ?Socioeconomic History  ? Marital status: Married  ?  Spouse name: Not on file  ? Number of children: 0  ? Years  of education: college  ? Highest education level: Not on file  ?Occupational History  ? Occupation: disabled  ?Tobacco Use  ? Smoking status: Never  ? Smokeless tobacco: Never  ?Vaping Use  ? Vaping Use: Never used  ?Substance and Sexual Activity  ? Alcohol use: No  ? Drug use: Never  ? Sexual activity: Yes  ?  Partners: Male  ?  Birth control/protection: None  ?Other Topics Concern  ? Not on file  ?Social History Narrative  ? Patient lives at home alone   ? Patient is right handed  ? Patient drinks soda's daily  ? ?Social Determinants of Health  ? ?Financial Resource Strain: Not on file  ?Food Insecurity: Not on file  ?Transportation Needs: Not on file  ?Physical Activity: Not on file  ?Stress: Not on file  ?Social Connections: Not on file  ?Intimate Partner Violence: Not on file  ? ? ?Family History:  ?Family History  ?Problem Relation Age of Onset  ? COPD Father   ? Anxiety disorder Maternal Grandmother   ? Depression Maternal Grandmother   ? Colon cancer Neg Hx   ? Colon polyps Neg Hx   ? ? ?Medications:   ?Current Outpatient Medications on File Prior to Visit  ?Medication Sig Dispense Refill  ? ACCU-CHEK AVIVA PLUS test strip     ? Accu-Chek Softclix Lancets lancets AS DIRECTED TO CHECK BLOOD SUGAR ONCE TO TWICE DAILY    ? albuterol (VENTOLIN HFA) 108  (90 Base) MCG/ACT inhaler INHALE TWO PUFFS BY MOUTH EVERY 6 HOURS AS NEEDED FOR WHEEZING OR SHORTNESS OF BREATH (Patient taking differently: Inhale 2 puffs into the lungs every 6 (six) hours as needed for whe

## 2021-12-03 NOTE — Patient Instructions (Signed)
Restart topamax 25mg  twice daily ? ?You will be called to complete a CT scan of your head to ensure no trauma occurred with your fall ? ?Important to use your CPAP machine nightly  ? ? ? ?Follow up in 4 months or call earlier if needed ?

## 2021-12-03 NOTE — Telephone Encounter (Signed)
[employer] medicare NPR sent to GI [phone] 

## 2021-12-14 NOTE — Progress Notes (Unsigned)
GI Office Note    Referring Provider: Joeseph Amor Primary Care Physician:  Encarnacion Slates, PA-C Primary GI: Dr. [name]  Date:  12/15/2021  ID:  Jamie [name], DOB 08-16-1970, MRN [id number]   Chief Complaint   Chief Complaint  Patient presents with   Dysphagia    Feels like she is choking even on water      History of Present Illness  Jamie [name] is a 51 y.o. female presenting today with a history of anxiety, bipolar, chronic diarrhea, GERD, HLD, HTN, PCOS, type 2 diabetes presenting today with complaint of worsening dysphagia  Last seen in the office August 2021.  She is having central chest pain with worsening on exertion associated with shortness of breath as well.  She had constant epigastric discomfort and taking Protonix 40 mg daily, denied any melena.  Was taking Zofran 1-2 times a day.  Also mid intermittent solid food dysphagia occurring once every couple weeks.  Reportedly diarrhea had improved having 1-2 bowel movements daily stools are soft and formed.  Was only having 1-2 incontinence episodes since hemorrhoid surgery.  She was on Bentyl 3 times daily before meals and at bedtime.  She took pancreatic enzymes for about 3 months which she stated seem to help some but with combination of Bentyl and pancreatic enzymes she was having intermittent constipation.  Reported that since starting the Bentyl her abdominal cramping had improved.  She was advised to continue Zofran as needed, Protonix daily, Bentyl 3 times daily before meals and at bedtime.  She was scheduled for EGD after cardiac evaluation was completed.  Last EGD November 2021 - normal esophagus s/p dilation, normal stomach, normal duodenum.   Today:  Dysphagia - having trouble with soft foods, liquids, and saliva. Base of her throat getting stuck. Food does go down easier after taking sips of water but has a gaging/choking. Happening with every meal and every consumption of liquid. Denies chest burning.  Taking Prilosec 40 BID. Is having some nausea, has it a lot, has been a long standing issue for her. Will get nauseas after bending over and getting up to fast. No food regurgitation. No melena or BRBPR.   Is on a diabetic diet - going to a dietician. Is feeling fuller due to this. Has been eating 4 vegetables a meal. Has been doing it since. Has lost about 10 pounds. Has stopped lactose and consumes minimal dairy products.  Does report some early satiety related to this as well as a metabolic appetite however she is also on Ozempic for her diabetes.  Denies issues with abdominal cramping.   She reports stable bowel habits so she has had diet change.  She denies any diarrhea, abdominal pain, or constipation  Past Medical History:  Diagnosis Date   Anxiety    Arthritis    knees   Bipolar 1 disorder ([facility])    Chronic diarrhea    Diverticulosis of colon    GERD (gastroesophageal reflux disease)    Hiatal hernia    History of concussion    per pt 11/ 2020 w/ no residual   Hyperlipidemia    Hypertension    followed by pcp   (11-29-2019  per pt stated never had stress test)   MDD (major depressive disorder)    Migraine    neurologist--- dr Terrace Arabia   OSA on CPAP    followed by dr dohmeier   PCOS (polycystic ovarian syndrome)    PONV (postoperative nausea and  vomiting)    Type 2 diabetes mellitus ([facility])    followed by pcp   (11-29-2019  pt stated checks blood sugar daily in am,  fasting sugar--- 96    Past Surgical History:  Procedure Laterality Date   ANAL FISSURE REPAIR  x3  last one  2007  approx.   BIOPSY  09/06/2019   Procedure: BIOPSY;  Surgeon: [name], MD;  [facility];  Service: Endoscopy;;   COLONOSCOPY WITH PROPOFOL [name] 09/06/2019   pancolonic diverticulosis. Segmental biopsy completed with benign biopsies.    ESOPHAGOGASTRODUODENOSCOPY (EGD) WITH PROPOFOL [name] 06/16/2020   Dr. [name]: normal esophagus s/p dilation, normal stomach, duodenum.    EVALUATION UNDER  ANESTHESIA WITH HEMORRHOIDECTOMY [name] 12/05/2019   Procedure: ANORECTAL EXAM UNDER ANESTHESIA WITH HEMORRHOIDECTOMY, HEMORRHOIDAL LIGATION/PEXY;  Surgeon: [name], MD;  Location: [facility];  Service: General;  Laterality: [name];  GENERAL AND LOCAL   EVALUATION UNDER ANESTHESIA WITH TEAR DUCT PROBING  infant   KNEE ARTHROSCOPY Right 1994   LAPAROSCOPIC CHOLECYSTECTOMY  08-06-2002  @[facility]    LUMBAR LAMINECTOMY/DECOMPRESSION MICRODISCECTOMY Right 01/22/2014   Procedure: LUMBAR FOUR TO FIVE LUMBAR LAMINECTOMY/DECOMPRESSION MICRODISCECTOMY 1 LEVEL;  Surgeon: 01/24/2014, MD;  Location: [facility];  Service: Neurosurgery;  Laterality: Right;  Right L45 diskectomy   MALONEY DILATION [name] 06/16/2020   Procedure: 06/18/2020 DILATION;  Surgeon: Elease [name], MD;  [facility];  Service: Endoscopy;  Laterality: [name];    Current Outpatient Medications  Medication Sig Dispense Refill   albuterol (VENTOLIN HFA) 108 (90 Base) MCG/ACT inhaler INHALE TWO PUFFS BY MOUTH EVERY 6 HOURS AS NEEDED FOR WHEEZING OR SHORTNESS OF BREATH (Patient taking differently: Inhale 2 puffs into the lungs every 6 (six) hours as needed for wheezing or shortness of breath.) 8.5 g 1   ARIPiprazole (ABILIFY) 10 MG tablet 2 qam 60 tablet 5   buPROPion (WELLBUTRIN XL) 150 MG 24 hr tablet Take 3 tablets (450 mg total) by mouth daily. 90 tablet 5   clonazePAM (KLONOPIN) 1 MG tablet Take 1 mg in the morning and 2 mg at night 90 tablet 4   diclofenac Sodium (VOLTAREN) 1 % GEL Apply 1 application topically 4 (four) times daily as needed (pain).     DULoxetine (CYMBALTA) 60 MG capsule Take 2 capsules (120 mg total) by mouth at bedtime. 60 capsule 4   empagliflozin (JARDIANCE) 25 MG TABS tablet Take by mouth daily.     fenofibrate micronized (LOFIBRA) 134 MG capsule Take 134 mg by mouth daily before breakfast.     fluticasone (FLONASE) 50 MCG/ACT nasal spray Place 1 spray into both nostrils at bedtime as needed for  allergies or rhinitis.      gabapentin (NEURONTIN) 100 MG capsule Take 200 mg by mouth at bedtime.      insulin glargine (LANTUS) 100 UNIT/ML injection Inject 10 Units into the skin daily.     lisinopril (PRINIVIL,ZESTRIL) 5 MG tablet Take 5 mg by mouth daily.     metFORMIN (GLUCOPHAGE-XR) 500 MG 24 hr tablet Take 500 mg by mouth daily.      omeprazole-sodium bicarbonate (ZEGERID) 40-1100 MG capsule TAKE ONE CAPSULE BY MOUTH TWICE DAILY BEFORE BREAKFAST AND BEFORE SUPPER 60 capsule 5   ondansetron (ZOFRAN-ODT) 4 MG disintegrating tablet TAKE 1 TABLET BY MOUTH EVERY 8 HOURS AS NEEDED FOR NAUSEA AND VOMITING 30 tablet 1   rosuvastatin (CRESTOR) 20 MG tablet Take 10 mg by mouth daily.     Semaglutide, 1  MG/DOSE, (OZEMPIC, 1 MG/DOSE,) 2 MG/1.5ML SOPN Inject 1 mg into the skin every Monday.     topiramate (TOPAMAX) 25 MG tablet Take 1 tablet (25 mg total) by mouth 2 (two) times daily. 60 tablet 5   traZODone (DESYREL) 100 MG tablet Take 4 tablets (400 mg total) by mouth at bedtime. 360 tablet 1   No current facility-administered medications for this visit.    Allergies as of 12/15/2021 - Review Complete 12/15/2021  Allergen Reaction Noted   Codeine Hives and Swelling 06/07/2011   Morphine and related Swelling and Rash 01/21/2014    Family History  Problem Relation Age of Onset   COPD Father    Anxiety disorder Maternal Grandmother    Depression Maternal Grandmother    Colon cancer Neg Hx    Colon polyps Neg Hx     Social History   Socioeconomic History   Marital status: Married    Spouse name: Not on file   Number of children: 0   Years of education: college   Highest education level: Not on file  Occupational History   Occupation: disabled  Tobacco Use   Smoking status: Never   Smokeless tobacco: Never  Vaping Use   Vaping Use: Never used  Substance and Sexual Activity   Alcohol use: No   Drug use: Never   Sexual activity: Yes    Partners: Male    Birth  control/protection: None  Other Topics Concern   Not on file  Social History Narrative   Patient lives at home alone    Patient is right handed   Patient drinks soda's daily   Social Determinants of Health   Financial Resource Strain: Not on file  Food Insecurity: Not on file  Transportation Needs: Not on file  Physical Activity: Not on file  Stress: Not on file  Social Connections: Not on file     Review of Systems   Gen: Denies fever, chills, anorexia. Denies fatigue, weakness, weight loss.  CV: Denies chest pain, palpitations, syncope, peripheral edema, and claudication. Resp: Denies dyspnea at rest, cough, wheezing, coughing up blood, and pleurisy. GI: see HPI Derm: Denies rash, itching, dry skin Psych: Denies depression, anxiety, memory loss, confusion. No homicidal or suicidal ideation.  Heme: Denies bruising, bleeding, and enlarged lymph nodes.   Physical Exam   BP 130/62   Pulse 100   Temp 97.6 F (36.4 C) (Temporal)   Ht 5\' 3"  (1.6 m)   Wt 226 lb 6.4 oz (102.7 kg)   BMI 40.10 kg/m   General:   Alert and oriented. No distress noted. Pleasant and cooperative.  Head:  Normocephalic and atraumatic. Eyes:  Conjuctiva clear without scleral icterus. Mouth:  Oral mucosa pink and moist. Good dentition. No lesions. Lungs:  Clear to auscultation bilaterally. No wheezes, rales, or rhonchi. No distress.  Heart:  S1, S2 present without murmurs appreciated.  Abdomen:  +BS, soft, non-tender and non-distended. No rebound or guarding. No HSM or masses noted. Rectal: Deferred Msk:  Symmetrical without gross deformities. Normal posture. Extremities:  Without edema. Neurologic:  Alert and  oriented x4 Psych:  Alert and cooperative. Normal mood and affect.   Assessment  Jamie [name] is a 51 y.o. female with a history of anxiety, bipolar, chronic diarrhea, GERD, HLD, HTN, PCOS, type 2 diabetes presenting today with complaint of worsening dysphagia.  Dysphagia: She  reported improvement of her symptoms after her last dilation.  Over the last few months she has noted worsening in her  dysphagia, now having dysphagia with liquids, solids, and sometimes even saliva.  Also has some intermittent pill dysphagia.  This has been on a daily basis with feeling like it is getting stuck at the base of her throat, sometimes needing to do multiple swallows in order to clear her throat.  Reported to her that her last EGD she had a normal esophagus and had empiric dilation.  Differentials include dysmotility, possible Schatzki's ring, reflux.  We will schedule for upper endoscopy with possible dilation with propofol with Dr. Jena Gaussourk in the near future.  Encouraged her to continue soft diet alternating small bites and sips of liquids.  Advised her to call the office if she begins having significant weight loss or inability to tolerate soft foods that she should transition to a full liquid diet and contact the office.  We discussed medication changes needed prior to procedure including holding oral diabetes medications the morning of her procedure, and having her normal dose of Lantus the night prior to procedure.  GERD: Has been omeprazole 40 mg twice daily.  Reports her symptoms are fairly well controlled.  She does have some intermittent nausea that has been going on for many years.  We will continue omeprazole twice daily, and proceed with EGD as stated above.   PLAN   Proceed with upper endoscopy +/- dilation with propofol by Dr. Jena Gaussourk in near future: the risks, benefits, and alternatives have been discussed with the patient in detail. The patient states understanding and desires to proceed.  ASA 3 1/2 dose Lantus night before Hold oral diabetes medication morning of procedure.  Continue alternating small bites of food with liquids.  Advised on precautions of when to contact the office. We will have further recommendations after procedure Should follow-up about 2 months post  procedure.    Brooke Bonitoourtney Alys Dulak, MSN, FNP-BC, AGACNP-BC [facility]

## 2021-12-15 ENCOUNTER — Encounter: Payer: Self-pay | Admitting: Gastroenterology

## 2021-12-15 ENCOUNTER — Ambulatory Visit: Payer: Medicare Other | Admitting: Gastroenterology

## 2021-12-15 VITALS — BP 130/62 | HR 100 | Temp 97.6°F | Ht 63.0 in | Wt 226.4 lb

## 2021-12-15 DIAGNOSIS — K219 Gastro-esophageal reflux disease without esophagitis: Secondary | ICD-10-CM

## 2021-12-15 DIAGNOSIS — R1310 Dysphagia, unspecified: Secondary | ICD-10-CM | POA: Diagnosis not present

## 2021-12-15 NOTE — Patient Instructions (Addendum)
We are scheduling you for an upper endoscopy with possible dilation with propofol with Dr. [name] in near future.  Continue to follow a soft diet until your procedure.  Continue alternate bites of food with sips of liquids ensuring that you chew your food all the way.  If you begin to have significant weight loss or inability to take in any solid food please transition to full liquid diet and contact the office to expedite your procedure.  Continue your omeprazole 40 mg twice daily.  We will have further recommendations after your procedure, may follow-up in about 2 months post procedure.  It was a pleasure to see you today. I want to create trusting relationships with patients. If you receive a survey regarding your visit,  I greatly appreciate you taking time to fill this out on paper or through your MyChart. I value your feedback.  Brooke Bonito, MSN, FNP-BC, AGACNP-BC [facility]

## 2021-12-17 ENCOUNTER — Encounter: Payer: Self-pay | Admitting: *Deleted

## 2021-12-17 ENCOUNTER — Telehealth: Payer: Self-pay | Admitting: *Deleted

## 2021-12-17 ENCOUNTER — Other Ambulatory Visit: Payer: Medicare Other

## 2021-12-17 NOTE — Telephone Encounter (Signed)
LMOVM to call back to schedule EGD +/-dil with Dr. [name], asa 3

## 2021-12-17 NOTE — Telephone Encounter (Signed)
Spoke with pt. She has been scheduled for 7/19 at 9:45am. Aware will mail instructions/pre-op appt.

## 2021-12-24 NOTE — Telephone Encounter (Signed)
PA approved via [facility]. Auth# [id number], DOS: Feb 10, 2022 - May 11, 2022

## 2022-01-08 ENCOUNTER — Other Ambulatory Visit: Payer: Medicare Other

## 2022-01-20 ENCOUNTER — Ambulatory Visit (HOSPITAL_BASED_OUTPATIENT_CLINIC_OR_DEPARTMENT_OTHER): Payer: Medicare Other | Admitting: Psychiatry

## 2022-01-20 DIAGNOSIS — F323 Major depressive disorder, single episode, severe with psychotic features: Secondary | ICD-10-CM

## 2022-01-20 DIAGNOSIS — F325 Major depressive disorder, single episode, in full remission: Secondary | ICD-10-CM

## 2022-01-20 MED ORDER — TRAZODONE HCL 100 MG PO TABS: 400.0000 mg | ORAL_TABLET | Freq: Every day | ORAL | 1 refills | Status: DC

## 2022-01-20 MED ORDER — BUPROPION HCL ER (XL) 150 MG PO TB24: 450.0000 mg | ORAL_TABLET | Freq: Every day | ORAL | 5 refills | Status: DC

## 2022-01-20 MED ORDER — ARIPIPRAZOLE 10 MG PO TABS: ORAL_TABLET | ORAL | 5 refills | Status: DC

## 2022-01-20 MED ORDER — DULOXETINE HCL 60 MG PO CPEP: 120.0000 mg | ORAL_CAPSULE | Freq: Every day | ORAL | 4 refills | Status: DC

## 2022-01-20 NOTE — Progress Notes (Signed)
Patient ID: Jamie [name], female   DOB: 1971/02/02, 51 y.o.   MRN: [id number] [facility] MD Progress Note  01/20/2022 2:37 PM Jamie [name]  MRN:  [id number] Subjective:  Feeling well. Principal Problem: Major depression recurrent   Today patient is doing quite well.  She drove here by herself.  She drove from eating.  The patient has lost weight.  Her mood is good.  She is sleeping and eating well.  Her health is improved.  Her hemoglobin A1c has dropped from 9.6-7.2 and she lost 10 pounds over the last month or 2.  Her mood is good and she continues to enjoy things she has a 76-year-old puppy named Jamie [name].  She enjoys the house that she lives in.  Her husband Jamie [name] and her been married for 5 years.  She has a good relationship with him.  The patient enjoys television.  The patient's emotions have been very stable.  She denies any use of alcohol or drugs.  In 2019 she was acutely psychiatrically hospitalized for suicidal thinking.  This is absolutely absent at this time.  She denies daily depression or anhedonia or any vegetative symptoms at this time.  She is functioning very well.  She lives with her elderly mother who owns the home.  She make efforts to try to get her mother to get will or to make arrangements for what to happen after her mother dies.  She also lives with her sister and she has a good relationship with her.  Today the patient had an aims scale demonstrated no evidence of tardive dyskinesia.  We reviewed her labs as well they seem to be very stable.  At this time she is seeing a diabetic dietitian in his regular medical care. Virtual Visit via Telephone Note  I connected with Jamie [name] on 05/13/2021 at  2:00 PM EDT by telephone and verified that I am speaking with the correct person using two identifiers.  Location: Patient: home Provider: office   I discussed the limitations, risks, security and privacy concerns of performing an evaluation and management service by  telephone and the availability of in person appointments. I also discussed with the patient that there may be a patient responsible charge related to this service. The patient expressed understanding and agreed to proceed.    I discussed the assessment and treatment plan with the patient. The patient was provided an opportunity to ask questions and all were answered. The patient agreed with the plan and demonstrated an understanding of the instructions.   The patient was advised to call back or seek an in-person evaluation if the symptoms worsen or if the condition fails to improve as anticipated.  I provided 30 minutes of non-face-to-face time during this encounter.   [name], MD   Past Medical History:  Diagnosis Date   Anxiety    Arthritis    knees   Bipolar 1 disorder (HCC)    Chronic diarrhea    Diverticulosis of colon    GERD (gastroesophageal reflux disease)    Hiatal hernia    History of concussion    per pt 11/ 2020 w/ no residual   Hyperlipidemia    Hypertension    followed by pcp   (11-29-2019  per pt stated never had stress test)   MDD (major depressive disorder)    Migraine    neurologist--- dr Terrace Arabia   OSA on CPAP    followed by dr dohmeier   PCOS (polycystic ovarian syndrome)  PONV (postoperative nausea and vomiting)    Type 2 diabetes mellitus (HCC)    followed by pcp   (11-29-2019  pt stated checks blood sugar daily in am,  fasting sugar--- 96    Past Surgical History:  Procedure Laterality Date   ANAL FISSURE REPAIR  x3  last one  2007  approx.   BIOPSY  09/06/2019   Procedure: BIOPSY;  Surgeon: [name], MD;  Location: [facility];  Service: Endoscopy;;   COLONOSCOPY WITH PROPOFOL [name] 09/06/2019   pancolonic diverticulosis. Segmental biopsy completed with benign biopsies.    ESOPHAGOGASTRODUODENOSCOPY (EGD) WITH PROPOFOL [name] 06/16/2020   Dr. [name]: normal esophagus s/p dilation, normal stomach, duodenum.    EVALUATION UNDER ANESTHESIA  WITH HEMORRHOIDECTOMY [name] 12/05/2019   Procedure: ANORECTAL EXAM UNDER ANESTHESIA WITH HEMORRHOIDECTOMY, HEMORRHOIDAL LIGATION/PEXY;  Surgeon: [name], MD;  Location: [facility];  Service: General;  Laterality: [name];  GENERAL AND LOCAL   EVALUATION UNDER ANESTHESIA WITH TEAR DUCT PROBING  infant   KNEE ARTHROSCOPY Right 1994   LAPAROSCOPIC CHOLECYSTECTOMY  08-06-2002  @[facility]    LUMBAR LAMINECTOMY/DECOMPRESSION MICRODISCECTOMY Right 01/22/2014   Procedure: LUMBAR FOUR TO FIVE LUMBAR LAMINECTOMY/DECOMPRESSION MICRODISCECTOMY 1 LEVEL;  Surgeon: 01/24/2014, MD;  Location: [facility];  Service: Neurosurgery;  Laterality: Right;  Right L45 diskectomy   MALONEY DILATION [name] 06/16/2020   Procedure: 06/18/2020 DILATION;  Surgeon: Elease [name], MD;  Location: [facility];  Service: Endoscopy;  Laterality: [name];   Family History:  Family History  Problem Relation Age of Onset   COPD Father    Anxiety disorder Maternal Grandmother    Depression Maternal Grandmother    Colon cancer Neg Hx    Colon polyps Neg Hx    Family Psychiatric  History:  Social History:  Social History   Substance and Sexual Activity  Alcohol Use No     Social History   Substance and Sexual Activity  Drug Use Never    Social History   Socioeconomic History   Marital status: Married    Spouse name: Not on file   Number of children: 0   Years of education: college   Highest education level: Not on file  Occupational History   Occupation: disabled  Tobacco Use   Smoking status: Never   Smokeless tobacco: Never  Vaping Use   Vaping Use: Never used  Substance and Sexual Activity   Alcohol use: No   Drug use: Never   Sexual activity: Yes    Partners: Male    Birth control/protection: None  Other Topics Concern   Not on file  Social History Narrative   Patient lives at home alone    Patient is right handed   Patient drinks soda's daily   Social Determinants of Health   Financial  Resource Strain: Not on file  Food Insecurity: Not on file  Transportation Needs: Not on file  Physical Activity: Not on file  Stress: Not on file  Social Connections: Not on file   Additional Social History:                         Sleep: Fair  Appetite:  Good  Current Medications: Current Outpatient Medications  Medication Sig Dispense Refill   albuterol (VENTOLIN HFA) 108 (90 Base) MCG/ACT inhaler INHALE TWO PUFFS BY MOUTH EVERY 6 HOURS AS NEEDED FOR WHEEZING OR SHORTNESS OF BREATH (Patient taking differently: Inhale 2 puffs into the lungs every 6 (  six) hours as needed for wheezing or shortness of breath.) 8.5 g 1   ARIPiprazole (ABILIFY) 10 MG tablet 2 qam 60 tablet 5   buPROPion (WELLBUTRIN XL) 150 MG 24 hr tablet Take 3 tablets (450 mg total) by mouth daily. 90 tablet 5   clonazePAM (KLONOPIN) 1 MG tablet Take 1 mg in the morning and 2 mg at night 90 tablet 4   diclofenac Sodium (VOLTAREN) 1 % GEL Apply 1 application topically 4 (four) times daily as needed (pain).     DULoxetine (CYMBALTA) 60 MG capsule Take 2 capsules (120 mg total) by mouth at bedtime. 60 capsule 4   empagliflozin (JARDIANCE) 25 MG TABS tablet Take by mouth daily.     fenofibrate micronized (LOFIBRA) 134 MG capsule Take 134 mg by mouth daily before breakfast.     fluticasone (FLONASE) 50 MCG/ACT nasal spray Place 1 spray into both nostrils at bedtime as needed for allergies or rhinitis.      gabapentin (NEURONTIN) 100 MG capsule Take 200 mg by mouth at bedtime.      insulin glargine (LANTUS) 100 UNIT/ML injection Inject 10 Units into the skin daily.     lisinopril (PRINIVIL,ZESTRIL) 5 MG tablet Take 5 mg by mouth daily.     metFORMIN (GLUCOPHAGE-XR) 500 MG 24 hr tablet Take 500 mg by mouth daily.      omeprazole-sodium bicarbonate (ZEGERID) 40-1100 MG capsule TAKE ONE CAPSULE BY MOUTH TWICE DAILY BEFORE BREAKFAST AND BEFORE SUPPER 60 capsule 5   ondansetron (ZOFRAN-ODT) 4 MG disintegrating tablet  TAKE 1 TABLET BY MOUTH EVERY 8 HOURS AS NEEDED FOR NAUSEA AND VOMITING 30 tablet 1   rosuvastatin (CRESTOR) 20 MG tablet Take 10 mg by mouth daily.     Semaglutide, 1 MG/DOSE, (OZEMPIC, 1 MG/DOSE,) 2 MG/1.5ML SOPN Inject 1 mg into the skin every Monday.     topiramate (TOPAMAX) 25 MG tablet Take 1 tablet (25 mg total) by mouth 2 (two) times daily. 60 tablet 5   traZODone (DESYREL) 100 MG tablet Take 4 tablets (400 mg total) by mouth at bedtime. 360 tablet 1   No current facility-administered medications for this visit.    Lab Results: No results found for this or any previous visit (from the past 48 hour(s)).  Physical Findings: AIMS:  , ,  ,  ,    CIWA:    COWS:     Musculoskeletal: Strength & Muscle Tone: within normal limits Gait & Station: normal Patient leans: Right  Psychiatric Specialty Exam: ROS  There were no vitals taken for this visit.There is no height or weight on file to calculate BMI.  General Appearance: Casual  Eye Contact::  Good  Speech:  Clear and Coherent  Volume:  Normal  Mood:Depressed  Affect:  Appropriate  Today the patient is doing very well. Thought Process:  Coherent  Orientation:  Full (Time, Place, and Person)  Thought Content:  NA and WDL  Suicidal Thoughts:  yes  Homicidal Thoughts:  No  Memory:  Immediate;   NA  Judgement:  Good  Insight:  Good  Psychomotor Activity:  NA  Concentration:  Good  Recall:  Good  Fund of Knowledge:Good  Language: Good  Akathisia:  No  Handed:  Right  AIMS (if indicated):     Assets:  Desire for Improvement  ADL's:  Intact  Cognition: WNL  Sleep:      Treatment Plan Summary: 01/20/2022, 2:37 PM    This patient's diagnosis is that of schizoaffective disorder.  She does very well on Abilify 20 mg, Wellbutrin 450 mg and Cymbalta 120 mg.  While she showed no evidence of tardive dyskinesia and her numbers look good we will consider the possibility of next visit reducing her Abilify from 20 to 15 mg.  We  will not do this without her husband being there.  Very little.  The next visit.  Her second problem is that of sleep issues.  She takes trazodone and she uses a CPAP machine her third problem is that of adjustment disorder with an anxious mood state.  She does very well taking Klonopin 1 mg 1 in the morning and 2 at night.  She is not oversedated.  The patient had no falls.  I believe she is very stable and will return to see me in 3 months.  We will continue all medications as prescribed.

## 2022-02-03 NOTE — Patient Instructions (Signed)
Jamie [name]  02/03/2022     @PREFPERIOPPHARMACY @   Your procedure is scheduled on  02/10/2022.   Report to 02/12/2022 at  0745  A.M.   Call this number if you have problems the morning of surgery:  [phone]   Remember:  Follow the diet instructions given to you by the office.    Your last dose of semaglutide should be on or before 02/02/2022.     DO NOT take any medications for diabetes the morning of your procedure.    Use your inhaler before you come and bring your rescue inhaler with you.     Take these medicines the morning of surgery with A SIP OF WATER           abilify, wellbutrin, klonopin, prilosec, zegerid, zofran (if needed), topamax.     Do not wear jewelry, make-up or nail polish.  Do not wear lotions, powders, or perfumes, or deodorant.  Do not shave 48 hours prior to surgery.  Men may shave face and neck.  Do not bring valuables to the hospital.  [facility] is not responsible for any belongings or valuables.  Contacts, dentures or bridgework may not be worn into surgery.  Leave your suitcase in the car.  After surgery it may be brought to your room.  For patients admitted to the hospital, discharge time will be determined by your treatment team.  Patients discharged the day of surgery will not be allowed to drive home and must have someone with them for 24 hours.    Special instructions:   DO NOT smoke tobacco or vape for 24 hours before your procedure.  Please read over the following fact sheets that you were given. Anesthesia Post-op Instructions and Care and Recovery After Surgery      Upper Endoscopy, Adult, Care After This sheet gives you information about how to care for yourself after your procedure. Your health care provider may also give you more specific instructions. If you have problems or questions, contact your health care provider. What can I expect after the procedure? After the procedure, it is common to  have: A sore throat. Mild stomach pain or discomfort. Bloating. Nausea. Follow these instructions at home:  Follow instructions from your health care provider about what to eat or drink after your procedure. Return to your normal activities as told by your health care provider. Ask your health care provider what activities are safe for you. Take over-the-counter and prescription medicines only as told by your health care provider. If you were given a sedative during the procedure, it can affect you for several hours. Do not drive or operate machinery until your health care provider says that it is safe. Keep all follow-up visits as told by your health care provider. This is important. Contact a health care provider if you have: A sore throat that lasts longer than one day. Trouble swallowing. Get help right away if: You vomit blood or your vomit looks like coffee grounds. You have: A fever. Bloody, black, or tarry stools. A severe sore throat or you cannot swallow. Difficulty breathing. Severe pain in your chest or abdomen. Summary After the procedure, it is common to have a sore throat, mild stomach discomfort, bloating, and nausea. If you were given a sedative during the procedure, it can affect you for several hours. Do not drive or operate machinery until your health care provider says that it is safe. Follow instructions  from your health care provider about what to eat or drink after your procedure. Return to your normal activities as told by your health care provider. This information is not intended to replace advice given to you by your health care provider. Make sure you discuss any questions you have with your health care provider. Document Revised: 05/18/2019 Document Reviewed: 12/12/2017 Elsevier Patient Education  2023 [employer] Inc[employer] Esophageal Dilatation Esophageal dilatation, also called esophageal dilation, is a procedure to widen or open a blocked or narrowed part of  the esophagus. The esophagus is the part of the body that moves food and liquid from the mouth to the stomach. You may need this procedure if: You have a buildup of scar tissue in your esophagus that makes it difficult, painful, or impossible to swallow. This can be caused by gastroesophageal reflux disease (GERD). You have cancer of the esophagus. There is a problem with how food moves through your esophagus. In some cases, you may need this procedure repeated at a later time to dilate the esophagus gradually. Tell a health care provider about: Any allergies you have. All medicines you are taking, including vitamins, herbs, eye drops, creams, and over-the-counter medicines. Any problems you or family members have had with anesthetic medicines. Any blood disorders you have. Any surgeries you have had. Any medical conditions you have. Any antibiotic medicines you are required to take before dental procedures. Whether you are pregnant or may be pregnant. What are the risks? Generally, this is a safe procedure. However, problems may occur, including: Bleeding due to a tear in the lining of the esophagus. A hole, or perforation, in the esophagus. What happens before the procedure? Ask your health care provider about: Changing or stopping your regular medicines. This is especially important if you are taking diabetes medicines or blood thinners. Taking medicines such as aspirin and ibuprofen. These medicines can thin your blood. Do not take these medicines unless your health care provider tells you to take them. Taking over-the-counter medicines, vitamins, herbs, and supplements. Follow instructions from your health care provider about eating or drinking restrictions. Plan to have a responsible adult take you home from the hospital or clinic. Plan to have a responsible adult care for you for the time you are told after you leave the hospital or clinic. This is important. What happens during the  procedure? You may be given a medicine to help you relax (sedative). A numbing medicine may be sprayed into the back of your throat, or you may gargle the medicine. Your health care provider may perform the dilatation using various surgical instruments, such as: Simple dilators. This instrument is carefully placed in the esophagus to stretch it. Guided wire bougies. This involves using an endoscope to insert a wire into the esophagus. A dilator is passed over this wire to enlarge the esophagus. Then the wire is removed. Balloon dilators. An endoscope with a small balloon is inserted into the esophagus. The balloon is inflated to stretch the esophagus and open it up. The procedure may vary among health care providers and hospitals. What can I expect after the procedure? Your blood pressure, heart rate, breathing rate, and blood oxygen level will be monitored until you leave the hospital or clinic. Your throat may feel slightly sore and numb. This will get better over time. You will not be allowed to eat or drink until your throat is no longer numb. When you are able to drink, urinate, and sit on the edge of the bed without  nausea or dizziness, you may be able to return home. Follow these instructions at home: Take over-the-counter and prescription medicines only as told by your health care provider. If you were given a sedative during the procedure, it can affect you for several hours. Do not drive or operate machinery until your health care provider says that it is safe. Plan to have a responsible adult care for you for the time you are told. This is important. Follow instructions from your health care provider about any eating or drinking restrictions. Do not use any products that contain nicotine or tobacco, such as cigarettes, e-cigarettes, and chewing tobacco. If you need help quitting, ask your health care provider. Keep all follow-up visits. This is important. Contact a health care provider  if: You have a fever. You have pain that is not relieved by medicine. Get help right away if: You have chest pain. You have trouble breathing. You have trouble swallowing. You vomit blood. You have black, tarry, or bloody stools. These symptoms may represent a serious problem that is an emergency. Do not wait to see if the symptoms will go away. Get medical help right away. Call your local emergency services (911 in the [place] Do not drive yourself to the hospital. Summary Esophageal dilatation, also called esophageal dilation, is a procedure to widen or open a blocked or narrowed part of the esophagus. Plan to have a responsible adult take you home from the hospital or clinic. For this procedure, a numbing medicine may be sprayed into the back of your throat, or you may gargle the medicine. Do not drive or operate machinery until your health care provider says that it is safe. This information is not intended to replace advice given to you by your health care provider. Make sure you discuss any questions you have with your health care provider. Document Revised: 11/28/2019 Document Reviewed: 11/28/2019 Elsevier Patient Education  2023 Else[employer] Inc. Monitored Anesthesia Care, Care After This sheet gives you information about how to care for yourself after your procedure. Your health care provider may also give you more specific instructions. If you have problems or questions, contact your health care provider. What can I expect after the procedure? After the procedure, it is common to have: Tiredness. Forgetfulness about what happened after the procedure. Impaired judgment for important decisions. Nausea or vomiting. Some difficulty with balance. Follow these instructions at home: For the time period you were told by your health care provider:     Rest as needed. Do not participate in activities where you could fall or become injured. Do not drive or use machinery. Do not drink  alcohol. Do not take sleeping pills or medicines that cause drowsiness. Do not make important decisions or sign legal documents. Do not take care of children on your own. Eating and drinking Follow the diet that is recommended by your health care provider. Drink enough fluid to keep your urine pale yellow. If you vomit: Drink water, juice, or soup when you can drink without vomiting. Make sure you have little or no nausea before eating solid foods. General instructions Have a responsible adult stay with you for the time you are told. It is important to have someone help care for you until you are awake and alert. Take over-the-counter and prescription medicines only as told by your health care provider. If you have sleep apnea, surgery and certain medicines can increase your risk for breathing problems. Follow instructions from your health care provider about wearing your  sleep device: Anytime you are sleeping, including during daytime naps. While taking prescription pain medicines, sleeping medicines, or medicines that make you drowsy. Avoid smoking. Keep all follow-up visits as told by your health care provider. This is important. Contact a health care provider if: You keep feeling nauseous or you keep vomiting. You feel light-headed. You are still sleepy or having trouble with balance after 24 hours. You develop a rash. You have a fever. You have redness or swelling around the IV site. Get help right away if: You have trouble breathing. You have new-onset confusion at home. Summary For several hours after your procedure, you may feel tired. You may also be forgetful and have poor judgment. Have a responsible adult stay with you for the time you are told. It is important to have someone help care for you until you are awake and alert. Rest as told. Do not drive or operate machinery. Do not drink alcohol or take sleeping pills. Get help right away if you have trouble breathing, or if  you suddenly become confused. This information is not intended to replace advice given to you by your health care provider. Make sure you discuss any questions you have with your health care provider. Document Revised: 06/16/2021 Document Reviewed: 06/14/2019 Elsevier Patient Education  2023 [employer].

## 2022-02-04 ENCOUNTER — Other Ambulatory Visit: Payer: Medicare Other

## 2022-02-05 ENCOUNTER — Encounter (HOSPITAL_COMMUNITY)
Admission: RE | Admit: 2022-02-05 | Discharge: 2022-02-05 | Disposition: A | Discharge status: Home / Self Care | Payer: Medicare Other | Source: Ambulatory Visit | Attending: Internal Medicine | Admitting: Internal Medicine

## 2022-02-05 ENCOUNTER — Encounter (HOSPITAL_COMMUNITY): Payer: Self-pay

## 2022-02-05 VITALS — BP 100/69 | HR 101 | Temp 98.5°F | Resp 18 | Ht 63.0 in | Wt 226.4 lb

## 2022-02-05 DIAGNOSIS — E119 Type 2 diabetes mellitus without complications: Secondary | ICD-10-CM | POA: Diagnosis not present

## 2022-02-05 DIAGNOSIS — I10 Essential (primary) hypertension: Secondary | ICD-10-CM | POA: Insufficient documentation

## 2022-02-05 DIAGNOSIS — Z01818 Encounter for other preprocedural examination: Secondary | ICD-10-CM | POA: Insufficient documentation

## 2022-02-05 LAB — BASIC METABOLIC PANEL
Anion gap: 10 (ref 5–15)
BUN: 24 mg/dL — ABNORMAL HIGH (ref 6–20)
CO2: 23 mmol/L (ref 22–32)
Calcium: 9.4 mg/dL (ref 8.9–10.3)
Chloride: 105 mmol/L (ref 98–111)
Creatinine, Ser: 1.1 mg/dL — ABNORMAL HIGH (ref 0.44–1.00)
GFR, Estimated: >60 mL/min (ref >60)
Glucose, Bld: 158 mg/dL — ABNORMAL HIGH (ref 70–99)
Potassium: 4.1 mmol/L (ref 3.5–5.1)
Sodium: 138 mmol/L (ref 135–145)

## 2022-02-05 LAB — POCT PREGNANCY, URINE: Preg Test, Ur: NEGATIVE

## 2022-02-10 ENCOUNTER — Encounter (HOSPITAL_COMMUNITY)
Admission: RE | Disposition: A | Discharge status: Home / Self Care | Payer: Self-pay | Source: Home / Self Care | Attending: Internal Medicine

## 2022-02-10 ENCOUNTER — Ambulatory Visit (HOSPITAL_COMMUNITY): Payer: Medicare Other | Admitting: Anesthesiology

## 2022-02-10 ENCOUNTER — Encounter (HOSPITAL_COMMUNITY): Payer: Self-pay | Admitting: Internal Medicine

## 2022-02-10 ENCOUNTER — Ambulatory Visit (HOSPITAL_BASED_OUTPATIENT_CLINIC_OR_DEPARTMENT_OTHER): Payer: Medicare Other | Admitting: Anesthesiology

## 2022-02-10 ENCOUNTER — Ambulatory Visit (HOSPITAL_COMMUNITY)
Admission: RE | Admit: 2022-02-10 | Discharge: 2022-02-10 | Disposition: A | Discharge status: Home / Self Care | Payer: Medicare Other | Attending: Internal Medicine | Admitting: Internal Medicine

## 2022-02-10 DIAGNOSIS — G4733 Obstructive sleep apnea (adult) (pediatric): Secondary | ICD-10-CM | POA: Diagnosis not present

## 2022-02-10 DIAGNOSIS — Z794 Long term (current) use of insulin: Secondary | ICD-10-CM | POA: Insufficient documentation

## 2022-02-10 DIAGNOSIS — R519 Headache, unspecified: Secondary | ICD-10-CM | POA: Insufficient documentation

## 2022-02-10 DIAGNOSIS — R1314 Dysphagia, pharyngoesophageal phase: Secondary | ICD-10-CM | POA: Insufficient documentation

## 2022-02-10 DIAGNOSIS — Z7984 Long term (current) use of oral hypoglycemic drugs: Secondary | ICD-10-CM | POA: Insufficient documentation

## 2022-02-10 DIAGNOSIS — I10 Essential (primary) hypertension: Secondary | ICD-10-CM | POA: Insufficient documentation

## 2022-02-10 DIAGNOSIS — F319 Bipolar disorder, unspecified: Secondary | ICD-10-CM | POA: Diagnosis not present

## 2022-02-10 DIAGNOSIS — F419 Anxiety disorder, unspecified: Secondary | ICD-10-CM | POA: Diagnosis not present

## 2022-02-10 DIAGNOSIS — R1310 Dysphagia, unspecified: Secondary | ICD-10-CM

## 2022-02-10 DIAGNOSIS — E119 Type 2 diabetes mellitus without complications: Secondary | ICD-10-CM | POA: Insufficient documentation

## 2022-02-10 DIAGNOSIS — K219 Gastro-esophageal reflux disease without esophagitis: Secondary | ICD-10-CM | POA: Diagnosis not present

## 2022-02-10 DIAGNOSIS — Z9989 Dependence on other enabling machines and devices: Secondary | ICD-10-CM | POA: Diagnosis not present

## 2022-02-10 DIAGNOSIS — Z79899 Other long term (current) drug therapy: Secondary | ICD-10-CM | POA: Diagnosis not present

## 2022-02-10 DIAGNOSIS — Z7985 Long-term (current) use of injectable non-insulin antidiabetic drugs: Secondary | ICD-10-CM | POA: Diagnosis not present

## 2022-02-10 HISTORY — PX: ESOPHAGOGASTRODUODENOSCOPY (EGD) WITH PROPOFOL: SHX5813

## 2022-02-10 HISTORY — PX: MALONEY DILATION: SHX5535

## 2022-02-10 LAB — GLUCOSE, CAPILLARY: Glucose-Capillary: 135 mg/dL — ABNORMAL HIGH (ref 70–99)

## 2022-02-10 SURGERY — ESOPHAGOGASTRODUODENOSCOPY (EGD) WITH PROPOFOL
Anesthesia: General

## 2022-02-10 MED ORDER — PROPOFOL 10 MG/ML IV BOLUS
INTRAVENOUS | Status: DC | PRN
  Administered 2022-02-10: 80 mg via INTRAVENOUS

## 2022-02-10 MED ORDER — PROPOFOL 500 MG/50ML IV EMUL
INTRAVENOUS | Status: AC
  Filled 2022-02-10: qty 50

## 2022-02-10 MED ORDER — PROPOFOL 500 MG/50ML IV EMUL
INTRAVENOUS | Status: DC | PRN
  Administered 2022-02-10: 200 ug/kg/min via INTRAVENOUS

## 2022-02-10 MED ORDER — LACTATED RINGERS IV SOLN: INTRAVENOUS | Status: DC

## 2022-02-10 NOTE — Transfer of Care (Signed)
Immediate Anesthesia Transfer of Care Note  Patient: Jamie [name]  Procedure(s) Performed: ESOPHAGOGASTRODUODENOSCOPY (EGD) WITH PROPOFOL MALONEY DILATION  Patient Location: PACU  Anesthesia Type:General  Level of Consciousness: awake, alert  and oriented  Airway & Oxygen Therapy: Patient Spontanous Breathing  Post-op Assessment: Report given to RN, Post -op Vital signs reviewed and stable, Patient moving all extremities X 4 and Patient able to stick tongue midline  Post vital signs: Reviewed  Last Vitals:  Vitals Value Taken Time  BP 108/67   Temp 36.8   Pulse 105   Resp 27   SpO2 96     Last Pain:  Vitals:   02/10/22 0753  TempSrc: Oral  PainSc: 0-No pain         Complications: No notable events documented.

## 2022-02-10 NOTE — H&P (Signed)
@LOGO @   Primary Care Physician:  Jalene Mullet, PA-C Primary Gastroenterologist:  Dr. Gala Romney  Pre-Procedure History & Physical: HPI:  Jamie [name] is a 51 y.o. female here for  further evaluation of esophageal dysphagia via EGD.  History of similar symptoms for which she underwent EGD with esophageal dilation 2 years ago.  Past Medical History:  Diagnosis Date   Anxiety    Arthritis    knees   Bipolar 1 disorder ([place])    Chronic diarrhea    Diverticulosis of colon    GERD (gastroesophageal reflux disease)    Hiatal hernia    History of concussion    per pt 11/ 2020 w/ no residual   Hyperlipidemia    Hypertension    followed by pcp   (11-29-2019  per pt stated never had stress test)   MDD (major depressive disorder)    Migraine    neurologist--- dr Krista Blue   OSA on CPAP    followed by dr dohmeier   PCOS (polycystic ovarian syndrome)    PONV (postoperative nausea and vomiting)    Type 2 diabetes mellitus ([place])    followed by pcp   (11-29-2019  pt stated checks blood sugar daily in am,  fasting sugar--- 96    Past Surgical History:  Procedure Laterality Date   ANAL FISSURE REPAIR  x3  last one  2007  approx.   BIOPSY  09/06/2019   Procedure: BIOPSY;  Surgeon: Daneil Dolin, MD;  Location: [facility];  Service: Endoscopy;;   COLONOSCOPY WITH PROPOFOL [name] 09/06/2019   pancolonic diverticulosis. Segmental biopsy completed with benign biopsies.    ESOPHAGOGASTRODUODENOSCOPY (EGD) WITH PROPOFOL [name] 06/16/2020   Dr. Gala Romney: normal esophagus s/p dilation, normal stomach, duodenum.    EVALUATION UNDER ANESTHESIA WITH HEMORRHOIDECTOMY [name] 12/05/2019   Procedure: ANORECTAL EXAM UNDER ANESTHESIA WITH HEMORRHOIDECTOMY, HEMORRHOIDAL LIGATION/PEXY;  Surgeon: Michael Boston, MD;  Location: [place];  Service: General;  Laterality: [name];  GENERAL AND LOCAL   EVALUATION UNDER ANESTHESIA WITH TEAR DUCT PROBING  infant   KNEE ARTHROSCOPY Right 1994   LAPAROSCOPIC  CHOLECYSTECTOMY  08-06-2002  @[facility]    LUMBAR LAMINECTOMY/DECOMPRESSION MICRODISCECTOMY Right 01/22/2014   Procedure: LUMBAR FOUR TO FIVE LUMBAR LAMINECTOMY/DECOMPRESSION MICRODISCECTOMY 1 LEVEL;  Surgeon: Floyce Stakes, MD;  Location: [facility];  Service: Neurosurgery;  Laterality: Right;  Right L45 diskectomy   MALONEY DILATION [name] 06/16/2020   Procedure: Venia Minks DILATION;  Surgeon: Daneil Dolin, MD;  Location: [facility];  Service: Endoscopy;  Laterality: [name];    Prior to Admission medications   Medication Sig Start Date End Date Taking? Authorizing Provider  albuterol (VENTOLIN HFA) 108 (90 Base) MCG/ACT inhaler INHALE TWO PUFFS BY MOUTH EVERY 6 HOURS AS NEEDED FOR WHEEZING OR SHORTNESS OF BREATH 04/04/20  Yes Martyn Ehrich, NP  ARIPiprazole (ABILIFY) 20 MG tablet Take 20 mg by mouth daily.   Yes [provider]  buPROPion (WELLBUTRIN XL) 150 MG 24 hr tablet Take 3 tablets (450 mg total) by mouth daily. 01/20/22  Yes Plovsky, Berneta Sages, MD  clonazePAM (KLONOPIN) 1 MG tablet Take 1 mg in the morning and 2 mg at night 10/14/21  Yes Plovsky, Berneta Sages, MD  DULoxetine (CYMBALTA) 60 MG capsule Take 2 capsules (120 mg total) by mouth at bedtime. 01/20/22  Yes Plovsky, Berneta Sages, MD  fenofibrate micronized (LOFIBRA) 134 MG capsule Take 134 mg by mouth daily before breakfast.   Yes [provider]  fluticasone (FLONASE) 50 MCG/ACT nasal  spray Place 1 spray into both nostrils at bedtime as needed for allergies or rhinitis.    Yes [provider]  gabapentin (NEURONTIN) 300 MG capsule Take 300 mg by mouth at bedtime. 02/19/20  Yes [provider]  insulin glargine (LANTUS) 100 UNIT/ML injection Inject 10 Units into the skin daily.   Yes [provider]  lisinopril (PRINIVIL,ZESTRIL) 5 MG tablet Take 5 mg by mouth daily.   Yes [provider]  metFORMIN (GLUCOPHAGE-XR) 500 MG 24 hr tablet Take 500 mg by mouth daily.  07/29/19  Yes [provider]   NON FORMULARY Pt uses a cpap nightly   Yes [provider]  omeprazole (PRILOSEC) 20 MG capsule Take 20 mg by mouth daily.   Yes [provider]  ondansetron (ZOFRAN-ODT) 4 MG disintegrating tablet TAKE 1 TABLET BY MOUTH EVERY 8 HOURS AS NEEDED FOR NAUSEA AND VOMITING 05/21/21  Yes Tiffany Kocher, PA-C  rosuvastatin (CRESTOR) 20 MG tablet Take 20 mg by mouth daily.   Yes [provider]  Semaglutide, 1 MG/DOSE, (OZEMPIC, 1 MG/DOSE,) 2 MG/1.5ML SOPN Inject 1 mg into the skin every Monday.   Yes [provider]  topiramate (TOPAMAX) 25 MG tablet Take 1 tablet (25 mg total) by mouth 2 (two) times daily. 12/03/21  Yes McCue, Shanda Bumps, NP  traZODone (DESYREL) 100 MG tablet Take 4 tablets (400 mg total) by mouth at bedtime. 01/20/22  Yes Plovsky, Earvin Hansen, MD  ARIPiprazole (ABILIFY) 10 MG tablet 2 qam Patient not taking: Reported on 02/02/2022 01/20/22   Archer Asa, MD  omeprazole-sodium bicarbonate (ZEGERID) 40-1100 MG capsule TAKE ONE CAPSULE BY MOUTH TWICE DAILY BEFORE BREAKFAST AND BEFORE SUPPER Patient not taking: Reported on 02/02/2022 01/21/21   Tiffany Kocher, PA-C    Allergies as of 12/17/2021 - Review Complete 12/15/2021  Allergen Reaction Noted   Codeine Hives and Swelling 06/07/2011   Morphine and related Swelling and Rash 01/21/2014    Family History  Problem Relation Age of Onset   COPD Father    Anxiety disorder Maternal Grandmother    Depression Maternal Grandmother    Colon cancer Neg Hx    Colon polyps Neg Hx     Social History   Socioeconomic History   Marital status: Married    Spouse name: Not on file   Number of children: 0   Years of education: college   Highest education level: Not on file  Occupational History   Occupation: disabled  Tobacco Use   Smoking status: Never   Smokeless tobacco: Never  Vaping Use   Vaping Use: Never used  Substance and Sexual Activity   Alcohol use: No   Drug use: Never   Sexual activity:  Yes    Partners: Male    Birth control/protection: None  Other Topics Concern   Not on file  Social History Narrative   Patient lives at home alone    Patient is right handed   Patient drinks soda's daily   Social Determinants of Health   Financial Resource Strain: Not on file  Food Insecurity: Not on file  Transportation Needs: Not on file  Physical Activity: Not on file  Stress: Not on file  Social Connections: Not on file  Intimate Partner Violence: Not on file    Review of Systems: See HPI, otherwise negative ROS  Physical Exam: BP 117/86   Pulse 96   Temp 98.2 F (36.8 C) (Oral)   Resp 16   Ht 5\' 3"  (1.6 m)  Wt 102.7 kg   SpO2 97%   BMI 40.11 kg/m  General:   Alert,  Well-developed, well-nourished, pleasant and cooperative in NAD Neck:  Supple; no masses or thyromegaly. No significant cervical adenopathy. Lungs:  Clear throughout to auscultation.   No wheezes, crackles, or rhonchi. No acute distress. Heart:  Regular rate and rhythm; no murmurs, clicks, rubs,  or gallops. Abdomen: Non-distended, normal bowel sounds.  Soft and nontender without appreciable mass or hepatosplenomegaly.  Pulses:  Normal pulses noted. Extremities:  Without clubbing or edema.  Impression/Plan:    52 year old lady with multiple comorbidities here for further evaluation of esophageal dysphagia via EGD. The risks, benefits, limitations, alternatives and imponderables have been reviewed with the patient. Potential for esophageal dilation, biopsy, etc. have also been reviewed.  Questions have been answered. All parties agreeable.            Notice: This dictation was prepared with Dragon dictation along with smaller phrase technology. Any transcriptional errors that result from this process are unintentional and may not be corrected upon review.

## 2022-02-10 NOTE — Discharge Instructions (Addendum)
EGD Discharge instructions Please read the instructions outlined below and refer to this sheet in the next few weeks. These discharge instructions provide you with general information on caring for yourself after you leave the hospital. Your doctor may also give you specific instructions. While your treatment has been planned according to the most current medical practices available, unavoidable complications occasionally occur. If you have any problems or questions after discharge, please call your doctor. ACTIVITY You may resume your regular activity but move at a slower pace for the next 24 hours.  Take frequent rest periods for the next 24 hours.  Walking will help expel (get rid of) the air and reduce the bloated feeling in your abdomen.  No driving for 24 hours (because of the anesthesia (medicine) used during the test).  You may shower.  Do not sign any important legal documents or operate any machinery for 24 hours (because of the anesthesia used during the test).  NUTRITION Drink plenty of fluids.  You may resume your normal diet.  Begin with a light meal and progress to your normal diet.  Avoid alcoholic beverages for 24 hours or as instructed by your caregiver.  MEDICATIONS You may resume your normal medications unless your caregiver tells you otherwise.  WHAT YOU CAN EXPECT TODAY You may experience abdominal discomfort such as a feeling of fullness or "gas" pains.  FOLLOW-UP Your doctor will discuss the results of your test with you.  SEEK IMMEDIATE MEDICAL ATTENTION IF ANY OF THE FOLLOWING OCCUR: Excessive nausea (feeling sick to your stomach) and/or vomiting.  Severe abdominal pain and distention (swelling).  Trouble swallowing.  Temperature over 101 F (37.8 C).  Rectal bleeding or vomiting of blood.       Your esophagus appeared normal today.  It was dilated.      We will plan to see you back in the office in 1 year and as needed   at patient request, I called Greggory Stallion  at [phone] -  call rolled to voicemail.  Left a message.

## 2022-02-10 NOTE — Anesthesia Preprocedure Evaluation (Signed)
Anesthesia Evaluation  Patient identified by MRN, date of birth, ID band Patient awake    Reviewed: Allergy & Precautions, NPO status , Patient's Chart, lab work & pertinent test results  History of Anesthesia Complications (+) PONV and history of anesthetic complications  Airway Mallampati: III  TM Distance: >3 FB Neck ROM: Full    Dental  (+) Dental Advisory Given, Missing   Pulmonary sleep apnea and Continuous Positive Airway Pressure Ventilation ,    Pulmonary exam normal breath sounds clear to auscultation       Cardiovascular hypertension, Pt. on medications negative cardio ROS Normal cardiovascular exam Rhythm:Regular Rate:Normal     Neuro/Psych  Headaches, PSYCHIATRIC DISORDERS Anxiety Depression Bipolar Disorder  Neuromuscular disease    GI/Hepatic Neg liver ROS, hiatal hernia, GERD  Medicated and Poorly Controlled,  Endo/Other  diabetes, Well Controlled, Type 2, Oral Hypoglycemic Agents  Renal/GU negative Renal ROS  negative genitourinary   Musculoskeletal  (+) Arthritis , Osteoarthritis,    Abdominal   Peds negative pediatric ROS (+)  Hematology negative hematology ROS (+)   Anesthesia Other Findings   Reproductive/Obstetrics negative OB ROS                            Anesthesia Physical Anesthesia Plan  ASA: 3  Anesthesia Plan: General   Post-op Pain Management: Minimal or no pain anticipated   Induction: Intravenous  PONV Risk Score and Plan: Propofol infusion  Airway Management Planned: Nasal Cannula and Natural Airway  Additional Equipment:   Intra-op Plan:   Post-operative Plan:   Informed Consent: I have reviewed the patients History and Physical, chart, labs and discussed the procedure including the risks, benefits and alternatives for the proposed anesthesia with the patient or authorized representative who has indicated his/her understanding and  acceptance.     Dental advisory given  Plan Discussed with: CRNA and Surgeon  Anesthesia Plan Comments:         Anesthesia Quick Evaluation

## 2022-02-10 NOTE — Anesthesia Postprocedure Evaluation (Signed)
Anesthesia Post Note  Patient: Jamie [name]  Procedure(s) Performed: ESOPHAGOGASTRODUODENOSCOPY (EGD) WITH PROPOFOL MALONEY DILATION  Patient location during evaluation: Phase II Anesthesia Type: General Level of consciousness: awake and alert and oriented Pain management: pain level controlled Vital Signs Assessment: post-procedure vital signs reviewed and stable Respiratory status: spontaneous breathing, nonlabored ventilation and respiratory function stable Cardiovascular status: blood pressure returned to baseline and stable Postop Assessment: no apparent nausea or vomiting Anesthetic complications: no   No notable events documented.   Last Vitals:  Vitals:   02/10/22 0753 02/10/22 0954  BP: 117/86 108/67  Pulse: 96 (!) 105  Resp: 16 16  Temp: 36.8 C   SpO2: 97% 96%    Last Pain:  Vitals:   02/10/22 0954  TempSrc:   PainSc: 0-No pain                 Chaim Gatley C Landyn Lorincz

## 2022-02-10 NOTE — Op Note (Signed)
[facility] Patient Name: Jamie [name] Procedure Date: 02/10/2022 9:15 AM MRN: [id number] Date of Birth: [birth year] Attending MD: Norvel Richards , MD CSN: [id number] Age: 51 Admit Type: Outpatient Procedure:                Upper GI endoscopy Indications:              Dysphagia Providers:                Norvel Richards, MD, Lurline Del, RN, Everardo Pacific, Aram Candela Referring MD:              Medicines:                Propofol per Anesthesia Complications:            No immediate complications. Estimated Blood Loss:     Estimated blood loss: none. Procedure:                Pre-Anesthesia Assessment:                           - Prior to the procedure, a History and Physical                            was performed, and patient medications and                            allergies were reviewed. The patient's tolerance of                            previous anesthesia was also reviewed. The risks                            and benefits of the procedure and the sedation                            options and risks were discussed with the patient.                            All questions were answered, and informed consent                            was obtained. Prior Anticoagulants: The patient has                            taken no previous anticoagulant or antiplatelet                            agents. ASA Grade Assessment: II - A patient with                            mild systemic disease. After reviewing the risks  and benefits, the patient was deemed in                            satisfactory condition to undergo the procedure.                           After obtaining informed consent, the endoscope was                            passed under direct vision. Throughout the                            procedure, the patient's blood pressure, pulse, and                            oxygen saturations were  monitored continuously. The                            [id number]) scope was introduced through the                            mouth, and advanced to the second part of duodenum.                            The upper GI endoscopy was accomplished without                            difficulty. The patient tolerated the procedure                            well. Scope In: 9:41:11 AM Scope Out: 9:48:48 AM Total Procedure Duration: 0 hours 7 minutes 37 seconds  Findings:      The examined esophagus was normal.      The entire examined stomach was normal.      The duodenal bulb and second portion of the duodenum were normal. The       scope was withdrawn. Dilation was performed with a Maloney dilator with       mild resistance at 61 Fr. Dilation was performed with a Maloney dilator       with mild resistance at 56 Fr. The dilation site was examined following       endoscope reinsertion and showed no change. Estimated blood loss: none. Impression:               - Normal esophagus. Dilated.                           - Normal stomach.                           - Normal duodenal bulb and second portion of the                            duodenum.                           -  No specimens collected. Moderate Sedation:      Moderate (conscious) sedation was personally administered by an       anesthesia professional. The following parameters were monitored: oxygen       saturation, heart rate, blood pressure, respiratory rate, EKG, adequacy       of pulmonary ventilation, and response to care. Recommendation:           - Patient has a contact number available for                            emergencies. The signs and symptoms of potential                            delayed complications were discussed with the                            patient. Return to normal activities tomorrow.                            Written discharge instructions were provided to the                             patient.                           - Resume previous diet.                           - Continue present medications. If recurrent                            dysphagia in the future, neck step would be a                            barium pill esophagram and we will go from there.                           - Return to my office in 1 year. Procedure Code(s):        --- Professional ---                           [phone], Esophagogastroduodenoscopy, flexible,                            transoral; diagnostic, including collection of                            specimen(s) by brushing or washing, when performed                            (separate procedure)                           43450, Dilation of esophagus, by unguided sound or  bougie, single or multiple passes Diagnosis Code(s):        --- Professional ---                           R13.10, Dysphagia, unspecified CPT copyright 2019 [employer]. All rights reserved. The codes documented in this report are preliminary and upon coder review may  be revised to meet current compliance requirements. Gerrit Friends. Ishi Danser, MD Gennette Pac, MD 02/10/2022 10:01:35 AM This report has been signed electronically. Number of Addenda: 0

## 2022-02-16 ENCOUNTER — Encounter (HOSPITAL_COMMUNITY): Payer: Self-pay | Admitting: Internal Medicine

## 2022-02-22 ENCOUNTER — Telehealth: Payer: Self-pay | Admitting: Internal Medicine

## 2022-02-22 NOTE — Telephone Encounter (Signed)
Pt needs to speak with nurse. [phone]

## 2022-02-24 ENCOUNTER — Ambulatory Visit: Payer: Medicare Other | Admitting: Family Medicine

## 2022-02-24 ENCOUNTER — Other Ambulatory Visit: Payer: Medicare Other

## 2022-02-24 NOTE — Telephone Encounter (Signed)
LMOM for pt to call office  

## 2022-04-01 ENCOUNTER — Ambulatory Visit: Payer: Medicare Other | Admitting: Adult Health

## 2022-04-03 ENCOUNTER — Other Ambulatory Visit (HOSPITAL_COMMUNITY): Payer: Self-pay | Admitting: Psychiatry

## 2022-04-07 ENCOUNTER — Ambulatory Visit: Payer: Medicare Other | Admitting: Nutrition

## 2022-04-21 ENCOUNTER — Telehealth (HOSPITAL_BASED_OUTPATIENT_CLINIC_OR_DEPARTMENT_OTHER): Payer: Medicare Other | Admitting: Psychiatry

## 2022-04-21 DIAGNOSIS — F324 Major depressive disorder, single episode, in partial remission: Secondary | ICD-10-CM

## 2022-04-21 DIAGNOSIS — F323 Major depressive disorder, single episode, severe with psychotic features: Secondary | ICD-10-CM

## 2022-04-21 MED ORDER — DULOXETINE HCL 60 MG PO CPEP: 120.0000 mg | ORAL_CAPSULE | Freq: Every day | ORAL | 4 refills | Status: DC

## 2022-04-21 MED ORDER — BUPROPION HCL ER (XL) 150 MG PO TB24
450.0000 mg | ORAL_TABLET | Freq: Every day | ORAL | 5 refills | Status: DC
Start: 2022-04-21 — End: 2022-07-14

## 2022-04-21 MED ORDER — TRAZODONE HCL 100 MG PO TABS: 400.0000 mg | ORAL_TABLET | Freq: Every day | ORAL | 1 refills | Status: DC

## 2022-04-21 MED ORDER — ARIPIPRAZOLE 20 MG PO TABS
20.0000 mg | ORAL_TABLET | Freq: Every day | ORAL | 1 refills | Status: DC
Start: 2022-04-21 — End: 2022-07-14

## 2022-04-21 MED ORDER — CLONAZEPAM 1 MG PO TABS: ORAL_TABLET | ORAL | 4 refills | Status: DC

## 2022-04-21 NOTE — Progress Notes (Signed)
Patient ID: Jamie [name], female   DOB: Mar 24, 1971, 51 y.o.   MRN: [id number] [facility] MD Progress Note  04/21/2022 2:19 PM Jamie [name]  MRN:  [id number] Subjective:  Feeling well. Principal Problem: Major depression recurrent    Today's appointment is.  Unfortunately the patient is COVID once again.  She has a dry cough and some mild shortness of breath.  She has no fever.  She feels very weak.  She denies being depressed.  She is sleeping fairly well and she still able to eat.  She has no psychotic symptomatology at this time.  Her husband [name] is taking good care of her.  She takes all medications as prescribed.  She drinks no alcohol and uses no drugs.  Today was a shortened visit.  The patient will return to see me in approximately 2 months and will bring her husband [name] with her.  At that time we will consider the possibility of reducing her Abilify from a dose of 20 mg down to 15 mg.  The patient is doing well for an extended period of time. Virtual Visit via Telephone Note  I connected with [name] on 05/13/2021 at  1:30 PM EDT by telephone and verified that I am speaking with the correct person using two identifiers.  Location: Patient: home Provider: office   I discussed the limitations, risks, security and privacy concerns of performing an evaluation and management service by telephone and the availability of in person appointments. I also discussed with the patient that there may be a patient responsible charge related to this service. The patient expressed understanding and agreed to proceed.    I discussed the assessment and treatment plan with the patient. The patient was provided an opportunity to ask questions and all were answered. The patient agreed with the plan and demonstrated an understanding of the instructions.   The patient was advised to call back or seek an in-person evaluation if the symptoms worsen or if the condition fails to improve as  anticipated.  I provided 30 minutes of non-face-to-face time during this encounter.   [name], MD   Past Medical History:  Diagnosis Date   Anxiety    Arthritis    knees   Bipolar 1 disorder (HCC)    Chronic diarrhea    Diverticulosis of colon    GERD (gastroesophageal reflux disease)    Hiatal hernia    History of concussion    per pt 11/ 2020 w/ no residual   Hyperlipidemia    Hypertension    followed by pcp   (11-29-2019  per pt stated never had stress test)   MDD (major depressive disorder)    Migraine    neurologist--- dr Terrace Arabia   OSA on CPAP    followed by dr dohmeier   PCOS (polycystic ovarian syndrome)    PONV (postoperative nausea and vomiting)    Type 2 diabetes mellitus (HCC)    followed by pcp   (11-29-2019  pt stated checks blood sugar daily in am,  fasting sugar--- 96    Past Surgical History:  Procedure Laterality Date   ANAL FISSURE REPAIR  x3  last one  2007  approx.   BIOPSY  09/06/2019   Procedure: BIOPSY;  Surgeon: [name], MD;  [facility]: [facility];  Service: Endoscopy;;   COLONOSCOPY WITH PROPOFOL [name] 09/06/2019   pancolonic diverticulosis. Segmental biopsy completed with benign biopsies.    ESOPHAGOGASTRODUODENOSCOPY (EGD) WITH PROPOFOL [name] 06/16/2020  Dr. [name]: normal esophagus s/p dilation, normal stomach, duodenum.    ESOPHAGOGASTRODUODENOSCOPY (EGD) WITH PROPOFOL [name] 02/10/2022   Procedure: ESOPHAGOGASTRODUODENOSCOPY (EGD) WITH PROPOFOL;  Surgeon: [name], MD;  Location: [facility];  Service: Endoscopy;  Laterality: [name];  9:45am   EVALUATION UNDER ANESTHESIA WITH HEMORRHOIDECTOMY [name] 12/05/2019   Procedure: ANORECTAL EXAM UNDER ANESTHESIA WITH HEMORRHOIDECTOMY, HEMORRHOIDAL LIGATION/PEXY;  Surgeon: [name], MD;  Location: [facility];  Service: General;  Laterality: [name];  GENERAL AND LOCAL   EVALUATION UNDER ANESTHESIA WITH TEAR DUCT PROBING  infant   KNEE ARTHROSCOPY Right 1994   LAPAROSCOPIC  CHOLECYSTECTOMY  08-06-2002  @[facility]    LUMBAR LAMINECTOMY/DECOMPRESSION MICRODISCECTOMY Right 01/22/2014   Procedure: LUMBAR FOUR TO FIVE LUMBAR LAMINECTOMY/DECOMPRESSION MICRODISCECTOMY 1 LEVEL;  Surgeon: 01/24/2014, MD;  Location: [facility];  Service: Neurosurgery;  Laterality: Right;  Right L45 diskectomy   MALONEY DILATION [name] 06/16/2020   Procedure: 06/18/2020 DILATION;  Surgeon: Elease [name], MD;  [facility]: [facility];  Service: Endoscopy;  Laterality: [name];   MALONEY DILATION [name] 02/10/2022   Procedure: 02/12/2022 DILATION;  Surgeon: Elease [name], MD;  [facility]: [facility];  Service: Endoscopy;  Laterality: [name];   Family History:  Family History  Problem Relation Age of Onset   COPD Father    Anxiety disorder Maternal Grandmother    Depression Maternal Grandmother    Colon cancer Neg Hx    Colon polyps Neg Hx    Family Psychiatric  History:  Social History:  Social History   Substance and Sexual Activity  Alcohol Use No     Social History   Substance and Sexual Activity  Drug Use Never    Social History   Socioeconomic History   Marital status: Married    Spouse name: Not on file   Number of children: 0   Years of education: college   Highest education level: Not on file  Occupational History   Occupation: disabled  Tobacco Use   Smoking status: Never   Smokeless tobacco: Never  Vaping Use   Vaping Use: Never used  Substance and Sexual Activity   Alcohol use: No   Drug use: Never   Sexual activity: Yes    Partners: Male    Birth control/protection: None  Other Topics Concern   Not on file  Social History Narrative   Patient lives at home alone    Patient is right handed   Patient drinks soda's daily   Social Determinants of Health   Financial Resource Strain: Not on file  Food Insecurity: Not on file  Transportation Needs: Not on file  Physical Activity: Not on file  Stress: Not on file  Social Connections: Not on file   Additional  Social History:                         Sleep: Fair  Appetite:  Good  Current Medications: Current Outpatient Medications  Medication Sig Dispense Refill   albuterol (VENTOLIN HFA) 108 (90 Base) MCG/ACT inhaler INHALE TWO PUFFS BY MOUTH EVERY 6 HOURS AS NEEDED FOR WHEEZING OR SHORTNESS OF BREATH 8.5 g 1   ARIPiprazole (ABILIFY) 10 MG tablet 2 qam (Patient not taking: Reported on 02/02/2022) 60 tablet 5   ARIPiprazole (ABILIFY) 20 MG tablet Take 1 tablet (20 mg total) by mouth daily. 90 tablet 1   buPROPion (WELLBUTRIN XL) 150 MG 24 hr tablet Take 3 tablets (450 mg total) by mouth daily.  90 tablet 5   clonazePAM (KLONOPIN) 1 MG tablet Take 1 mg in the morning and 2 mg at night 90 tablet 4   DULoxetine (CYMBALTA) 60 MG capsule Take 2 capsules (120 mg total) by mouth at bedtime. 60 capsule 4   fenofibrate micronized (LOFIBRA) 134 MG capsule Take 134 mg by mouth daily before breakfast.     fluticasone (FLONASE) 50 MCG/ACT nasal spray Place 1 spray into both nostrils at bedtime as needed for allergies or rhinitis.      gabapentin (NEURONTIN) 300 MG capsule Take 300 mg by mouth at bedtime.     insulin glargine (LANTUS) 100 UNIT/ML injection Inject 10 Units into the skin daily.     lisinopril (PRINIVIL,ZESTRIL) 5 MG tablet Take 5 mg by mouth daily.     metFORMIN (GLUCOPHAGE-XR) 500 MG 24 hr tablet Take 500 mg by mouth daily.      NON FORMULARY Pt uses a cpap nightly     omeprazole (PRILOSEC) 20 MG capsule Take 20 mg by mouth daily.     omeprazole-sodium bicarbonate (ZEGERID) 40-1100 MG capsule TAKE ONE CAPSULE BY MOUTH TWICE DAILY BEFORE BREAKFAST AND BEFORE SUPPER (Patient not taking: Reported on 02/02/2022) 60 capsule 5   ondansetron (ZOFRAN-ODT) 4 MG disintegrating tablet TAKE 1 TABLET BY MOUTH EVERY 8 HOURS AS NEEDED FOR NAUSEA AND VOMITING 30 tablet 1   rosuvastatin (CRESTOR) 20 MG tablet Take 20 mg by mouth daily.     Semaglutide, 1 MG/DOSE, (OZEMPIC, 1 MG/DOSE,) 2 MG/1.5ML SOPN  Inject 1 mg into the skin every Monday.     topiramate (TOPAMAX) 25 MG tablet Take 1 tablet (25 mg total) by mouth 2 (two) times daily. 60 tablet 5   traZODone (DESYREL) 100 MG tablet Take 4 tablets (400 mg total) by mouth at bedtime. 360 tablet 1   No current facility-administered medications for this visit.    Lab Results: No results found for this or any previous visit (from the past 48 hour(s)).  Physical Findings: AIMS:  , ,  ,  ,    CIWA:    COWS:     Musculoskeletal: Strength & Muscle Tone: within normal limits Gait & Station: normal Patient leans: Right  Psychiatric Specialty Exam: ROS  There were no vitals taken for this visit.There is no height or weight on file to calculate BMI.  General Appearance: Casual  Eye Contact::  Good  Speech:  Clear and Coherent  Volume:  Normal  Mood:Depressed  Affect:  Appropriate  Today the patient is doing very well. Thought Process:  Coherent  Orientation:  Full (Time, Place, and Person)  Thought Content:  NA and WDL  Suicidal Thoughts:  yes  Homicidal Thoughts:  No  Memory:  Immediate;   NA  Judgement:  Good  Insight:  Good  Psychomotor Activity:  NA  Concentration:  Good  Recall:  Good  Fund of Knowledge:Good  Language: Good  Akathisia:  No  Handed:  Right  AIMS (if indicated):     Assets:  Desire for Improvement  ADL's:  Intact  Cognition: WNL  Sleep:      Treatment Plan Summary: 04/21/2022, 2:19 PM   Today the patient seems to be stable.  She will continue all the medications prescribed and return to see me in 2 months.  She has a chronic psychotic disorder.  She continues taking Abilify 20 mg Cymbalta 120 mg and Wellbutrin 450.  She continues taking Klonopin 1 mg in the morning and 2 mg at night.  I believe she is very stable.

## 2022-05-03 DIAGNOSIS — K861 Other chronic pancreatitis: Secondary | ICD-10-CM | POA: Insufficient documentation

## 2022-05-04 DIAGNOSIS — I5032 Chronic diastolic (congestive) heart failure: Secondary | ICD-10-CM | POA: Insufficient documentation

## 2022-05-04 DIAGNOSIS — Z6838 Body mass index (BMI) 38.0-38.9, adult: Secondary | ICD-10-CM | POA: Insufficient documentation

## 2022-05-11 ENCOUNTER — Other Ambulatory Visit: Payer: Self-pay | Admitting: Adult Health

## 2022-05-11 DIAGNOSIS — G4452 New daily persistent headache (NDPH): Secondary | ICD-10-CM

## 2022-05-25 ENCOUNTER — Encounter: Payer: Self-pay | Admitting: Internal Medicine

## 2022-05-25 ENCOUNTER — Ambulatory Visit (INDEPENDENT_AMBULATORY_CARE_PROVIDER_SITE_OTHER): Payer: Medicare Other | Admitting: Internal Medicine

## 2022-05-25 VITALS — BP 117/69 | HR 116 | Temp 97.7°F | Ht 63.0 in | Wt 233.2 lb

## 2022-05-25 DIAGNOSIS — R7989 Other specified abnormal findings of blood chemistry: Secondary | ICD-10-CM

## 2022-05-25 MED ORDER — ZENPEP 40000-126000 UNITS PO CPEP: ORAL_CAPSULE | ORAL | 3 refills | Status: DC

## 2022-05-25 NOTE — Progress Notes (Unsigned)
Primary Care Physician:  Joeseph Amor Primary Gastroenterologist:  Dr.   Pre-Procedure History & Physical: HPI:  Jamie [name] is a 51 y.o. female here for hospital follow-up.  At [facility] 5 days admitted with a 3-week history of abdominal pain.  Diagnosed with pancreatitis.  Apparently, 3 weeks worth of epigastric pain led to an ED visit.  Pancreatitis confirmed by serum lipase and acute inflammatory change of the pancreas on CT apparently, necrotizing component in the body/proximal tail.  She improved with symptomatic treatment and was discharged.  Gallbladder long gone.  This lady does not consume alcohol.  0 intake.  She had been on Ozempic and this was stopped thinking it has been associated with pancreatitis.  No family history of pancreatitis.  Lipid status unknown to me.  Pancreatic enzyme supplementation was recommended, she went to the drugstore to pick them up and stated they were not called in.   I saw this lady for dysphagia earlier in the year EGD was essentially normal.  She did respond to empiric dilation.  Colonoscopy 2021 segmental biopsies negative had chronic diarrhea at that time.  She has not had a fever to 101 or greater since discharge.  She rates her abdominal pain is 4/out of 10.  Denies diarrhea melena or rectal bleeding.  Has been able to eat.  Past Medical History:  Diagnosis Date   Anxiety    Arthritis    knees   Bipolar 1 disorder (HCC)    Chronic diarrhea    Diverticulosis of colon    GERD (gastroesophageal reflux disease)    Hiatal hernia    History of concussion    per pt 11/ 2020 w/ no residual   Hyperlipidemia    Hypertension    followed by pcp   (11-29-2019  per pt stated never had stress test)   MDD (major depressive disorder)    Migraine    neurologist--- dr Terrace Arabia   OSA on CPAP    followed by dr dohmeier   PCOS (polycystic ovarian syndrome)    PONV (postoperative nausea and vomiting)    Type 2 diabetes mellitus (HCC)    followed by  pcp   (11-29-2019  pt stated checks blood sugar daily in am,  fasting sugar--- 96    Past Surgical History:  Procedure Laterality Date   ANAL FISSURE REPAIR  x3  last one  2007  approx.   BIOPSY  09/06/2019   Procedure: BIOPSY;  Surgeon: [name], MD;  [facility];  Service: Endoscopy;;   COLONOSCOPY WITH PROPOFOL [name] 09/06/2019   pancolonic diverticulosis. Segmental biopsy completed with benign biopsies.    ESOPHAGOGASTRODUODENOSCOPY (EGD) WITH PROPOFOL [name] 06/16/2020   Dr. [name]: normal esophagus s/p dilation, normal stomach, duodenum.    ESOPHAGOGASTRODUODENOSCOPY (EGD) WITH PROPOFOL [name] 02/10/2022   Procedure: ESOPHAGOGASTRODUODENOSCOPY (EGD) WITH PROPOFOL;  Surgeon: [name], MD;  [facility];  Service: Endoscopy;  Laterality: [name];  9:45am   EVALUATION UNDER ANESTHESIA WITH HEMORRHOIDECTOMY [name] 12/05/2019   Procedure: ANORECTAL EXAM UNDER ANESTHESIA WITH HEMORRHOIDECTOMY, HEMORRHOIDAL LIGATION/PEXY;  Surgeon: [name], MD;  Location: [facility];  Service: General;  Laterality: [name];  GENERAL AND LOCAL   EVALUATION UNDER ANESTHESIA WITH TEAR DUCT PROBING  infant   KNEE ARTHROSCOPY Right 1994   LAPAROSCOPIC CHOLECYSTECTOMY  08-06-2002  @[facility]    LUMBAR LAMINECTOMY/DECOMPRESSION MICRODISCECTOMY Right 01/22/2014   Procedure: LUMBAR FOUR TO FIVE LUMBAR LAMINECTOMY/DECOMPRESSION MICRODISCECTOMY 1 LEVEL;  Surgeon: 01/24/2014,  MD;  Location: [facility];  Service: Neurosurgery;  Laterality: Right;  Right L45 diskectomy   MALONEY DILATION [name] 06/16/2020   Procedure: Venia Minks DILATION;  Surgeon: Daneil Dolin, MD;  [facility];  Service: Endoscopy;  Laterality: [name];   MALONEY DILATION [name] 02/10/2022   Procedure: Venia Minks DILATION;  Surgeon: Daneil Dolin, MD;  [facility];  Service: Endoscopy;  Laterality: [name];    Prior to Admission medications   Medication Sig Start Date End Date Taking? Authorizing Provider   albuterol (VENTOLIN HFA) 108 (90 Base) MCG/ACT inhaler INHALE TWO PUFFS BY MOUTH EVERY 6 HOURS AS NEEDED FOR WHEEZING OR SHORTNESS OF BREATH 04/04/20  Yes Martyn Ehrich, NP  ARIPiprazole (ABILIFY) 10 MG tablet 2 qam 01/20/22  Yes Plovsky, Berneta Sages, MD  ARIPiprazole (ABILIFY) 20 MG tablet Take 1 tablet (20 mg total) by mouth daily. 04/21/22  Yes Plovsky, Berneta Sages, MD  buPROPion (WELLBUTRIN XL) 150 MG 24 hr tablet Take 3 tablets (450 mg total) by mouth daily. 04/21/22  Yes Plovsky, Berneta Sages, MD  clonazePAM (KLONOPIN) 1 MG tablet Take 1 mg in the morning and 2 mg at night 04/21/22  Yes Plovsky, Berneta Sages, MD  DULoxetine (CYMBALTA) 60 MG capsule Take 2 capsules (120 mg total) by mouth at bedtime. 04/21/22  Yes Plovsky, Berneta Sages, MD  fenofibrate micronized (LOFIBRA) 134 MG capsule Take 134 mg by mouth daily before breakfast.   Yes [provider]  fluticasone (FLONASE) 50 MCG/ACT nasal spray Place 1 spray into both nostrils at bedtime as needed for allergies or rhinitis.    Yes [provider]  gabapentin (NEURONTIN) 300 MG capsule Take 300 mg by mouth at bedtime. 02/19/20  Yes [provider]  insulin glargine (LANTUS) 100 UNIT/ML injection Inject 10 Units into the skin daily.   Yes [provider]  lisinopril (PRINIVIL,ZESTRIL) 5 MG tablet Take 5 mg by mouth daily.   Yes [provider]  metFORMIN (GLUCOPHAGE-XR) 500 MG 24 hr tablet Take 500 mg by mouth daily.  07/29/19  Yes [provider]  NON FORMULARY Pt uses a cpap nightly   Yes [provider]  omeprazole (PRILOSEC) 20 MG capsule Take 20 mg by mouth daily.   Yes [provider]  ondansetron (ZOFRAN-ODT) 4 MG disintegrating tablet TAKE 1 TABLET BY MOUTH EVERY 8 HOURS AS NEEDED FOR NAUSEA AND VOMITING 05/21/21  Yes Mahala Menghini, PA-C  rosuvastatin (CRESTOR) 20 MG tablet Take 20 mg by mouth daily.   Yes [provider]  Semaglutide, 1 MG/DOSE, (OZEMPIC, 1 MG/DOSE,) 2 MG/1.5ML SOPN  Inject 1 mg into the skin every Monday.   Yes [provider]  topiramate (TOPAMAX) 25 MG tablet Take 1 tablet (25 mg total) by mouth 2 (two) times daily. 12/03/21  Yes McCue, Janett Billow, NP  traZODone (DESYREL) 100 MG tablet Take 4 tablets (400 mg total) by mouth at bedtime. 04/21/22  Yes Plovsky, Berneta Sages, MD    Allergies as of 05/25/2022 - Review Complete 05/25/2022  Allergen Reaction Noted   Codeine Hives and Swelling 06/07/2011   Morphine and related Swelling and Rash 01/21/2014    Family History  Problem Relation Age of Onset   COPD Father    Anxiety disorder Maternal Grandmother    Depression Maternal Grandmother    Colon cancer Neg Hx    Colon polyps Neg Hx     Social History   Socioeconomic History   Marital status: Married    Spouse name: Not on file  Number of children: 0   Years of education: college   Highest education level: Not on file  Occupational History   Occupation: disabled  Tobacco Use   Smoking status: Never   Smokeless tobacco: Never  Vaping Use   Vaping Use: Never used  Substance and Sexual Activity   Alcohol use: No   Drug use: Never   Sexual activity: Yes    Partners: Male    Birth control/protection: None  Other Topics Concern   Not on file  Social History Narrative   Patient lives at home alone    Patient is right handed   Patient drinks soda's daily   Social Determinants of Health   Financial Resource Strain: Not on file  Food Insecurity: Not on file  Transportation Needs: Not on file  Physical Activity: Not on file  Stress: Not on file  Social Connections: Not on file  Intimate Partner Violence: Not on file    Review of Systems: See HPI, otherwise negative ROS  Physical Exam: BP 117/69 (BP Location: Right Arm, Patient Position: Sitting, Cuff Size: Large)   Pulse (!) 116   Temp 97.7 F (36.5 C) (Oral)   Ht 5\' 3"  (1.6 m)   Wt 233 lb 3.2 oz (105.8 kg)   LMP  (LMP Unknown)   SpO2 99%   BMI 41.31 kg/m  General:    Alert, chronically ill-appearing.  Mildly tachycardic.  pleasant and cooperative in NAD Neck:  Supple; no masses or thyromegaly. No significant cervical adenopathy. Lungs:  Clear throughout to auscultation.   No wheezes, crackles, or rhonchi. No acute distress. Heart:  Regular rate and rhythm; no murmurs, clicks, rubs,  or gallops. Abdomen: Central obesity present.  Positive bowel sounds soft minimal epigastric tenderness to palpation no obvious mass Pulses:  Normal pulses noted. Extremities:  Without clubbing or edema.  Impression/Plan: 51 year old lady admitted to the hospital recently with her first bout of acute pancreatitis.  Gallbladder out.  CT suggested necrotizing component..  Concerns for  Ozempic associated pancreatitis.  That medication was stopped.  Clinically, she has improved.  No biliary dilation called on cross-sectional imaging.  Not mentioned above, but she had a comprehensive metabolic profile during her hospitalization which was normal.  Certainly, there is no family history of pancreatitis.  No alcohol consumption.  Etiology of pancreatitis not well-defined.  It could certainly be medication related but this is rare.  Lipid status unknown to me.  Recommendations:  Add Zenpep pancreatic enzyme capsule 2 capsules with meals 1 with snacks daily.  (Dispense 270 with 3 refills  Continue omeprazole or Prilosec 20 mg each morning 30 minutes before breakfast  Low fat diet  CBC, CHEM 20, serum lipase, fasting lipid panel   Office visit with 45 in 3 months  Will need a repeat CT scan of your pancreas in about 3 months.  Office visit here first.  If fever to 101 or worsening abdominal pain let me know.   ,    Notice: This dictation was prepared with Dragon dictation along with smaller phrase technology. Any transcriptional errors that result from this process are unintentional and may not be corrected upon review.

## 2022-05-25 NOTE — Patient Instructions (Addendum)
It was good seeing you again today!  Add Zenpep pancreatic enzyme capsule 2 capsules with meals 1 with snacks daily.  (Dispense 270 with 3 refills  Continue omeprazole or Prilosec 20 mg each morning 30 minutes before breakfast  Low fat diet  CBC, CHEM 20, serum lipase, fasting lipid panel   Office visit with [place] in 3 months  You will need a repeat CT scan of your pancreas in about 3 months.  Office visit here first.  If you develop fever to 101 or worsening abdominal pain between now and then, please let me know.   ,

## 2022-06-04 LAB — CBC WITH DIFFERENTIAL/PLATELET
Absolute Monocytes: 548 cells/uL (ref 200–950)
Basophils Absolute: 67 cells/uL (ref 0–200)
Basophils Relative: 0.9 %
Eosinophils Absolute: 96 cells/uL (ref 15–500)
Eosinophils Relative: 1.3 %
HCT: 40.8 % (ref 35.0–45.0)
Hemoglobin: 13.6 g/dL (ref 11.7–15.5)
Lymphs Abs: 3419 cells/uL (ref 850–3900)
MCH: 28.9 pg (ref 27.0–33.0)
MCHC: 33.3 g/dL (ref 32.0–36.0)
MCV: 86.6 fL (ref 80.0–100.0)
MPV: 10.9 fL (ref 7.5–12.5)
Monocytes Relative: 7.4 %
Neutro Abs: 3271 cells/uL (ref 1500–7800)
Neutrophils Relative %: 44.2 %
Platelets: 169 10*3/uL (ref 140–400)
RBC: 4.71 10*6/uL (ref 3.80–5.10)
RDW: 14.5 % (ref 11.0–15.0)
Total Lymphocyte: 46.2 %
WBC: 7.4 10*3/uL (ref 3.8–10.8)

## 2022-06-04 LAB — LIPID PANEL
Cholesterol: 172 mg/dL (ref <200)
HDL: 30 mg/dL — ABNORMAL LOW (ref >=50)
Non-HDL Cholesterol (Calc): 142 mg/dL (calc) — ABNORMAL HIGH (ref <130)
Total CHOL/HDL Ratio: 5.7 (calc) — ABNORMAL HIGH (ref <5.0)
Triglycerides: 1743 mg/dL — ABNORMAL HIGH (ref <150)

## 2022-06-04 LAB — LIPASE: Lipase: 57 U/L (ref 7–60)

## 2022-06-04 LAB — COMPREHENSIVE METABOLIC PANEL
AG Ratio: 1.7 (calc) (ref 1.0–2.5)
ALT: 17 U/L (ref 6–29)
AST: 20 U/L (ref 10–35)
Albumin: 4.6 g/dL (ref 3.6–5.1)
Alkaline phosphatase (APISO): 69 U/L (ref 37–153)
BUN/Creatinine Ratio: 21 (calc) (ref 6–22)
BUN: 25 mg/dL (ref 7–25)
CO2: 25 mmol/L (ref 20–32)
Calcium: 10.1 mg/dL (ref 8.6–10.4)
Chloride: 99 mmol/L (ref 98–110)
Creat: 1.17 mg/dL — ABNORMAL HIGH (ref 0.50–1.03)
Globulin: 2.7 g/dL (calc) (ref 1.9–3.7)
Glucose, Bld: 224 mg/dL — ABNORMAL HIGH (ref 65–99)
Potassium: 4.5 mmol/L (ref 3.5–5.3)
Sodium: 136 mmol/L (ref 135–146)
Total Bilirubin: 0.4 mg/dL (ref 0.2–1.2)
Total Protein: 7.3 g/dL (ref 6.1–8.1)

## 2022-07-14 ENCOUNTER — Ambulatory Visit (HOSPITAL_BASED_OUTPATIENT_CLINIC_OR_DEPARTMENT_OTHER): Payer: Medicare Other | Admitting: Psychiatry

## 2022-07-14 DIAGNOSIS — F329 Major depressive disorder, single episode, unspecified: Secondary | ICD-10-CM | POA: Diagnosis not present

## 2022-07-14 DIAGNOSIS — F323 Major depressive disorder, single episode, severe with psychotic features: Secondary | ICD-10-CM | POA: Diagnosis not present

## 2022-07-14 MED ORDER — CLONAZEPAM 1 MG PO TABS: ORAL_TABLET | ORAL | 4 refills | Status: DC

## 2022-07-14 MED ORDER — ARIPIPRAZOLE 20 MG PO TABS: 20.0000 mg | ORAL_TABLET | Freq: Every day | ORAL | 1 refills | Status: DC

## 2022-07-14 MED ORDER — TRAZODONE HCL 100 MG PO TABS: 400.0000 mg | ORAL_TABLET | Freq: Every day | ORAL | 1 refills | Status: DC

## 2022-07-14 MED ORDER — BUPROPION HCL ER (XL) 150 MG PO TB24: 450.0000 mg | ORAL_TABLET | Freq: Every day | ORAL | 5 refills | Status: DC

## 2022-07-14 MED ORDER — DULOXETINE HCL 60 MG PO CPEP: 120.0000 mg | ORAL_CAPSULE | Freq: Every day | ORAL | 4 refills | Status: DC

## 2022-07-14 NOTE — Progress Notes (Signed)
Patient ID: Jamie [name], female   DOB: [birth year], 51 y.o.   MRN: [id number] [facility] MD Progress Note  07/14/2022 3:11 PM ENJOLI TIDD  MRN:  [id number] Subjective:  Feeling well. Principal Problem: Major depression recurrent    Today the patient is doing fairly well.  Physically though he does have pancreatitis probably related to hypercholesterolemia.  Today she could not drive here today because her course in the garage.  She is sleeping and eating well.  Financially she is doing okay.  She likes where she lives.  Her husband [name] is doing well and he is working.  We have thoughts about reducing her Abilify but given the holidays and given problems with her getting here we will continue it at 20 mg.  The patient continues seeing Jasmine December and resolution therapy.  The patient is very stable.  She is diagnosed with depression.  She takes her medicines as prescribed.  She denies use of alcohol or drugs.  On her next visit she will be sure to make it here. Virtual Visit via Telephone Note  I connected with [name] on 05/13/2021 at  2:00 PM EST by telephone and verified that I am speaking with the correct person using two identifiers.  Location: Patient: home Provider: office   I discussed the limitations, risks, security and privacy concerns of performing an evaluation and management service by telephone and the availability of in person appointments. I also discussed with the patient that there may be a patient responsible charge related to this service. The patient expressed understanding and agreed to proceed.    I discussed the assessment and treatment plan with the patient. The patient was provided an opportunity to ask questions and all were answered. The patient agreed with the plan and demonstrated an understanding of the instructions.   The patient was advised to call back or seek an in-person evaluation if the symptoms worsen or if the condition fails to improve as  anticipated.  I provided 30 minutes of non-face-to-face time during this encounter.   [name], MD   Past Medical History:  Diagnosis Date   Anxiety    Arthritis    knees   Bipolar 1 disorder (HCC)    Chronic diarrhea    Diverticulosis of colon    GERD (gastroesophageal reflux disease)    Hiatal hernia    History of concussion    per pt 11/ 2020 w/ no residual   Hyperlipidemia    Hypertension    followed by pcp   (11-29-2019  per pt stated never had stress test)   MDD (major depressive disorder)    Migraine    neurologist--- dr Terrace Arabia   OSA on CPAP    followed by dr dohmeier   PCOS (polycystic ovarian syndrome)    PONV (postoperative nausea and vomiting)    Type 2 diabetes mellitus (HCC)    followed by pcp   (11-29-2019  pt stated checks blood sugar daily in am,  fasting sugar--- 96    Past Surgical History:  Procedure Laterality Date   ANAL FISSURE REPAIR  x3  last one  2007  approx.   BIOPSY  09/06/2019   Procedure: BIOPSY;  Surgeon: [name], MD;  [facility];  Service: Endoscopy;;   COLONOSCOPY WITH PROPOFOL [name] 09/06/2019   pancolonic diverticulosis. Segmental biopsy completed with benign biopsies.    ESOPHAGOGASTRODUODENOSCOPY (EGD) WITH PROPOFOL [name] 06/16/2020   Dr. [name]: normal esophagus s/p dilation, normal stomach,  duodenum.    ESOPHAGOGASTRODUODENOSCOPY (EGD) WITH PROPOFOL [name] 02/10/2022   Procedure: ESOPHAGOGASTRODUODENOSCOPY (EGD) WITH PROPOFOL;  Surgeon: Corbin Adeourk, Robert M, MD;  [facility];  Service: Endoscopy;  Laterality: [name];  9:45am   EVALUATION UNDER ANESTHESIA WITH HEMORRHOIDECTOMY [name] 12/05/2019   Procedure: ANORECTAL EXAM UNDER ANESTHESIA WITH HEMORRHOIDECTOMY, HEMORRHOIDAL LIGATION/PEXY;  Surgeon: Karie SodaGross, Steven, MD;  Location: [facility];  Service: General;  Laterality: [name];  GENERAL AND LOCAL   EVALUATION UNDER ANESTHESIA WITH TEAR DUCT PROBING  infant   KNEE ARTHROSCOPY Right 1994   LAPAROSCOPIC  CHOLECYSTECTOMY  08-06-2002  @[facility]    LUMBAR LAMINECTOMY/DECOMPRESSION MICRODISCECTOMY Right 01/22/2014   Procedure: LUMBAR FOUR TO FIVE LUMBAR LAMINECTOMY/DECOMPRESSION MICRODISCECTOMY 1 LEVEL;  Surgeon: Karn CassisErnesto M Botero, MD;  Location: [facility];  Service: Neurosurgery;  Laterality: Right;  Right L45 diskectomy   MALONEY DILATION [name] 06/16/2020   Procedure: Elease HashimotoMALONEY DILATION;  Surgeon: Corbin Adeourk, Robert M, MD;  [facility];  Service: Endoscopy;  Laterality: [name];   MALONEY DILATION [name] 02/10/2022   Procedure: Elease HashimotoMALONEY DILATION;  Surgeon: Corbin Adeourk, Robert M, MD;  [facility];  Service: Endoscopy;  Laterality: [name];   Family History:  Family History  Problem Relation Age of Onset   COPD Father    Anxiety disorder Maternal Grandmother    Depression Maternal Grandmother    Colon cancer Neg Hx    Colon polyps Neg Hx    Family Psychiatric  History:  Social History:  Social History   Substance and Sexual Activity  Alcohol Use No     Social History   Substance and Sexual Activity  Drug Use Never    Social History   Socioeconomic History   Marital status: Married    Spouse name: Not on file   Number of children: 0   Years of education: college   Highest education level: Not on file  Occupational History   Occupation: disabled  Tobacco Use   Smoking status: Never   Smokeless tobacco: Never  Vaping Use   Vaping Use: Never used  Substance and Sexual Activity   Alcohol use: No   Drug use: Never   Sexual activity: Yes    Partners: Male    Birth control/protection: None  Other Topics Concern   Not on file  Social History Narrative   Patient lives at home alone    Patient is right handed   Patient drinks soda's daily   Social Determinants of Health   Financial Resource Strain: Not on file  Food Insecurity: Not on file  Transportation Needs: Not on file  Physical Activity: Not on file  Stress: Not on file  Social Connections: Not on file   Additional  Social History:                         Sleep: Fair  Appetite:  Good  Current Medications: Current Outpatient Medications  Medication Sig Dispense Refill   albuterol (VENTOLIN HFA) 108 (90 Base) MCG/ACT inhaler INHALE TWO PUFFS BY MOUTH EVERY 6 HOURS AS NEEDED FOR WHEEZING OR SHORTNESS OF BREATH 8.5 g 1   ARIPiprazole (ABILIFY) 20 MG tablet Take 1 tablet (20 mg total) by mouth daily. 90 tablet 1   buPROPion (WELLBUTRIN XL) 150 MG 24 hr tablet Take 3 tablets (450 mg total) by mouth daily. 90 tablet 5   clonazePAM (KLONOPIN) 1 MG tablet Take 1 mg in the morning and 2 mg at night 90 tablet 4  DULoxetine (CYMBALTA) 60 MG capsule Take 2 capsules (120 mg total) by mouth at bedtime. 60 capsule 4   fenofibrate micronized (LOFIBRA) 134 MG capsule Take 134 mg by mouth daily before breakfast.     fluticasone (FLONASE) 50 MCG/ACT nasal spray Place 1 spray into both nostrils at bedtime as needed for allergies or rhinitis.      gabapentin (NEURONTIN) 300 MG capsule Take 300 mg by mouth at bedtime.     insulin glargine (LANTUS) 100 UNIT/ML injection Inject 10 Units into the skin daily.     lisinopril (PRINIVIL,ZESTRIL) 5 MG tablet Take 5 mg by mouth daily.     metFORMIN (GLUCOPHAGE-XR) 500 MG 24 hr tablet Take 500 mg by mouth daily.      NON FORMULARY Pt uses a cpap nightly     omeprazole (PRILOSEC) 20 MG capsule Take 20 mg by mouth daily.     ondansetron (ZOFRAN-ODT) 4 MG disintegrating tablet TAKE 1 TABLET BY MOUTH EVERY 8 HOURS AS NEEDED FOR NAUSEA AND VOMITING 30 tablet 1   Pancrelipase, Lip-Prot-Amyl, (ZENPEP) 40000-126000 units CPEP Take 2 capsules with meals and 1 capsule with snacks. 270 capsule 3   rosuvastatin (CRESTOR) 20 MG tablet Take 20 mg by mouth daily.     Semaglutide, 1 MG/DOSE, (OZEMPIC, 1 MG/DOSE,) 2 MG/1.5ML SOPN Inject 1 mg into the skin every Monday.     topiramate (TOPAMAX) 25 MG tablet Take 1 tablet (25 mg total) by mouth 2 (two) times daily. 60 tablet 5   traZODone  (DESYREL) 100 MG tablet Take 4 tablets (400 mg total) by mouth at bedtime. 360 tablet 1   No current facility-administered medications for this visit.    Lab Results: No results found for this or any previous visit (from the past 48 hour(s)).  Physical Findings: AIMS:  , ,  ,  ,    CIWA:    COWS:     Musculoskeletal: Strength & Muscle Tone: within normal limits Gait & Station: normal Patient leans: Right  Psychiatric Specialty Exam: ROS  There were no vitals taken for this visit.There is no height or weight on file to calculate BMI.  General Appearance: Casual  Eye Contact::  Good  Speech:  Clear and Coherent  Volume:  Normal  Mood:Depressed  Affect:  Appropriate  Today the patient is doing very well. Thought Process:  Coherent  Orientation:  Full (Time, Place, and Person)  Thought Content:  NA and WDL  Suicidal Thoughts:  yes  Homicidal Thoughts:  No  Memory:  Immediate;   NA  Judgement:  Good  Insight:  Good  Psychomotor Activity:  NA  Concentration:  Good  Recall:  Good  Fund of Knowledge:Good  Language: Good  Akathisia:  No  Handed:  Right  AIMS (if indicated):     Assets:  Desire for Improvement  ADL's:  Intact  Cognition: WNL  Sleep:      Treatment Plan Summary: 07/14/2022, 3:11 PM   At this time the patient's diagnosis is major depression with psychosis.  She will continue taking Abilify 20 mg.  Her next visit we will consider the possibility of reducing it to 15.  For now she will continue taking Wellbutrin 450 and Cymbalta 120 mg.  Her second problem is an adjustment disorder with an anxious mood state.  She will continue taking 1 mg of Klonopin 1 in the morning and 2 at night.  The patient is not suicidal.  She is functioning well.  She will return to  see me in 3 months.

## 2022-08-04 DIAGNOSIS — G4733 Obstructive sleep apnea (adult) (pediatric): Secondary | ICD-10-CM | POA: Diagnosis not present

## 2022-08-04 DIAGNOSIS — G4700 Insomnia, unspecified: Secondary | ICD-10-CM | POA: Diagnosis not present

## 2022-08-04 DIAGNOSIS — I10 Essential (primary) hypertension: Secondary | ICD-10-CM | POA: Diagnosis not present

## 2022-08-04 DIAGNOSIS — G4710 Hypersomnia, unspecified: Secondary | ICD-10-CM | POA: Diagnosis not present

## 2022-08-24 DIAGNOSIS — E782 Mixed hyperlipidemia: Secondary | ICD-10-CM | POA: Diagnosis not present

## 2022-08-24 DIAGNOSIS — E1165 Type 2 diabetes mellitus with hyperglycemia: Secondary | ICD-10-CM | POA: Diagnosis not present

## 2022-08-24 DIAGNOSIS — E781 Pure hyperglyceridemia: Secondary | ICD-10-CM | POA: Diagnosis not present

## 2022-08-24 DIAGNOSIS — E7849 Other hyperlipidemia: Secondary | ICD-10-CM | POA: Diagnosis not present

## 2022-08-25 DIAGNOSIS — E782 Mixed hyperlipidemia: Secondary | ICD-10-CM | POA: Diagnosis not present

## 2022-08-25 DIAGNOSIS — I10 Essential (primary) hypertension: Secondary | ICD-10-CM | POA: Diagnosis not present

## 2022-08-25 DIAGNOSIS — K219 Gastro-esophageal reflux disease without esophagitis: Secondary | ICD-10-CM | POA: Diagnosis not present

## 2022-08-31 ENCOUNTER — Ambulatory Visit: Payer: Medicare Other | Admitting: Internal Medicine

## 2022-08-31 ENCOUNTER — Encounter: Payer: Self-pay | Admitting: Internal Medicine

## 2022-08-31 VITALS — BP 102/68 | HR 94 | Temp 97.7°F | Ht 63.0 in | Wt 230.8 lb

## 2022-08-31 DIAGNOSIS — K219 Gastro-esophageal reflux disease without esophagitis: Secondary | ICD-10-CM

## 2022-08-31 DIAGNOSIS — R7989 Other specified abnormal findings of blood chemistry: Secondary | ICD-10-CM | POA: Diagnosis not present

## 2022-08-31 DIAGNOSIS — K529 Noninfective gastroenteritis and colitis, unspecified: Secondary | ICD-10-CM

## 2022-08-31 MED ORDER — ONDANSETRON 4 MG PO TBDP: ORAL_TABLET | ORAL | 3 refills | Status: DC

## 2022-08-31 NOTE — Patient Instructions (Addendum)
Good to see you again today!  Very high triglycerides level may be the culprit in causing your pancreatitis  For now, continue pancreatic enzymes and Prilosec or omeprazole 20 mg daily   will obtain a fecal elastase assay   pancreatic protocol CT-rule out small pancreatic neoplasm recent history of acute pancreatitis   office visit with [place] in 3 months   new prescription for Zofran 4 mg tablets.  Dispense  90 with 3 refills

## 2022-08-31 NOTE — Progress Notes (Unsigned)
Primary Care Physician:  Riley Lam Primary Gastroenterologist:  Dr. Gala Romney  Pre-Procedure History & Physical: HPI:  Jamie [name] is a 52 y.o. female here for  follow-up of pancreatitis uncertain etiology.  Gallbladder long gone.  No alcohol.  It turns out her triglyceride level was over 1700.  She is being treated medically for that entity.  Overall much improved abdominal pain no more diarrhea.  On pancreatic enzymes and PPI.  Weight down 5 pounds since she was last seen in October of last year.  She needs pancreatic protocol CT to make sure there is not an occult neoplasm present.    GERD well-controlled.  No dysphagia.  EGD with esophageal dilation empirically performed with good results.    Due for repeat screening colonoscopy 2031.  Past Medical History:  Diagnosis Date   Anxiety    Arthritis    knees   Bipolar 1 disorder ([place])    Chronic diarrhea    Diverticulosis of colon    GERD (gastroesophageal reflux disease)    Hiatal hernia    History of concussion    per pt 11/ 2020 w/ no residual   Hyperlipidemia    Hypertension    followed by pcp   (11-29-2019  per pt stated never had stress test)   MDD (major depressive disorder)    Migraine    neurologist--- dr Krista Blue   OSA on CPAP    followed by dr dohmeier   PCOS (polycystic ovarian syndrome)    PONV (postoperative nausea and vomiting)    Type 2 diabetes mellitus ([place])    followed by pcp   (11-29-2019  pt stated checks blood sugar daily in am,  fasting sugar--- 96    Past Surgical History:  Procedure Laterality Date   ANAL FISSURE REPAIR  x3  last one  2007  approx.   BIOPSY  09/06/2019   Procedure: BIOPSY;  Surgeon: Daneil Dolin, MD;  [facility];  Service: Endoscopy;;   COLONOSCOPY WITH PROPOFOL [name] 09/06/2019   pancolonic diverticulosis. Segmental biopsy completed with benign biopsies.    ESOPHAGOGASTRODUODENOSCOPY (EGD) WITH PROPOFOL [name] 06/16/2020   Dr. Gala Romney: normal esophagus s/p  dilation, normal stomach, duodenum.    ESOPHAGOGASTRODUODENOSCOPY (EGD) WITH PROPOFOL [name] 02/10/2022   Procedure: ESOPHAGOGASTRODUODENOSCOPY (EGD) WITH PROPOFOL;  Surgeon: Daneil Dolin, MD;  [facility];  Service: Endoscopy;  Laterality: [name];  9:45am   EVALUATION UNDER ANESTHESIA WITH HEMORRHOIDECTOMY [name] 12/05/2019   Procedure: ANORECTAL EXAM UNDER ANESTHESIA WITH HEMORRHOIDECTOMY, HEMORRHOIDAL LIGATION/PEXY;  Surgeon: Michael Boston, MD;  Location: [place];  Service: General;  Laterality: [name];  GENERAL AND LOCAL   EVALUATION UNDER ANESTHESIA WITH TEAR DUCT PROBING  infant   KNEE ARTHROSCOPY Right 1994   LAPAROSCOPIC CHOLECYSTECTOMY  08-06-2002  @[facility]    LUMBAR LAMINECTOMY/DECOMPRESSION MICRODISCECTOMY Right 01/22/2014   Procedure: LUMBAR FOUR TO FIVE LUMBAR LAMINECTOMY/DECOMPRESSION MICRODISCECTOMY 1 LEVEL;  Surgeon: Floyce Stakes, MD;  Location: [facility];  Service: Neurosurgery;  Laterality: Right;  Right L45 diskectomy   MALONEY DILATION [name] 06/16/2020   Procedure: Venia Minks DILATION;  Surgeon: Daneil Dolin, MD;  [facility];  Service: Endoscopy;  Laterality: [name];   MALONEY DILATION [name] 02/10/2022   Procedure: Venia Minks DILATION;  Surgeon: Daneil Dolin, MD;  [facility];  Service: Endoscopy;  Laterality: [name];    Prior to Admission medications   Medication Sig Start Date End Date Taking? Authorizing Provider  albuterol (VENTOLIN HFA)  108 (90 Base) MCG/ACT inhaler INHALE TWO PUFFS BY MOUTH EVERY 6 HOURS AS NEEDED FOR WHEEZING OR SHORTNESS OF BREATH 04/04/20  Yes Martyn Ehrich, NP  ARIPiprazole (ABILIFY) 20 MG tablet Take 1 tablet (20 mg total) by mouth daily. 07/14/22  Yes Plovsky, Berneta Sages, MD  buPROPion (WELLBUTRIN XL) 150 MG 24 hr tablet Take 3 tablets (450 mg total) by mouth daily. 07/14/22  Yes Plovsky, Berneta Sages, MD  clonazePAM (KLONOPIN) 1 MG tablet Take 1 mg in the morning and 2 mg at night 07/14/22  Yes Plovsky, Berneta Sages, MD   DULoxetine (CYMBALTA) 60 MG capsule Take 2 capsules (120 mg total) by mouth at bedtime. 07/14/22  Yes Plovsky, Berneta Sages, MD  fluticasone (FLONASE) 50 MCG/ACT nasal spray Place 1 spray into both nostrils at bedtime as needed for allergies or rhinitis.    Yes [provider]  gabapentin (NEURONTIN) 300 MG capsule Take 300 mg by mouth at bedtime. 02/19/20  Yes [provider]  insulin glargine (LANTUS) 100 UNIT/ML injection Inject 33 Units into the skin daily.   Yes [provider]  lisinopril (PRINIVIL,ZESTRIL) 5 MG tablet Take 5 mg by mouth daily.   Yes [provider]  metFORMIN (GLUCOPHAGE-XR) 500 MG 24 hr tablet Take 500 mg by mouth daily.  07/29/19  Yes [provider]  NON FORMULARY Pt uses a cpap nightly   Yes [provider]  omeprazole (PRILOSEC) 20 MG capsule Take 20 mg by mouth daily.   Yes [provider]  ondansetron (ZOFRAN-ODT) 4 MG disintegrating tablet TAKE 1 TABLET BY MOUTH EVERY 8 HOURS AS NEEDED FOR NAUSEA AND VOMITING 05/21/21  Yes Mahala Menghini, PA-C  Pancrelipase, Lip-Prot-Amyl, (ZENPEP) 40000-126000 units CPEP Take 2 capsules with meals and 1 capsule with snacks. 05/25/22  Yes Lineth Thielke, Cristopher Estimable, MD  rosuvastatin (CRESTOR) 20 MG tablet Take 20 mg by mouth daily.   Yes [provider]  topiramate (TOPAMAX) 25 MG tablet Take 1 tablet (25 mg total) by mouth 2 (two) times daily. 12/03/21  Yes McCue, Janett Billow, NP  traZODone (DESYREL) 100 MG tablet Take 4 tablets (400 mg total) by mouth at bedtime. 07/14/22  Yes Plovsky, Berneta Sages, MD    Allergies as of 08/31/2022 - Review Complete 08/31/2022  Allergen Reaction Noted   Codeine Hives and Swelling 06/07/2011   Morphine and related Swelling and Rash 01/21/2014    Family History  Problem Relation Age of Onset   COPD Father    Anxiety disorder Maternal Grandmother    Depression Maternal Grandmother    Colon cancer Neg Hx    Colon polyps Neg Hx     Social History    Socioeconomic History   Marital status: Married    Spouse name: Not on file   Number of children: 0   Years of education: college   Highest education level: Not on file  Occupational History   Occupation: disabled  Tobacco Use   Smoking status: Never   Smokeless tobacco: Never  Vaping Use   Vaping Use: Never used  Substance and Sexual Activity   Alcohol use: No   Drug use: Never   Sexual activity: Yes    Partners: Male    Birth control/protection: None  Other Topics Concern   Not on file  Social History Narrative   Patient lives at home alone    Patient is right handed   Patient drinks soda's daily   Social Determinants of Health   Financial Resource Strain: Not on file  Food Insecurity:  Not on file  Transportation Needs: Not on file  Physical Activity: Not on file  Stress: Not on file  Social Connections: Not on file  Intimate Partner Violence: Not on file    Review of Systems: See HPI, otherwise negative ROS  Physical Exam: BP 102/68 (BP Location: Left Arm, Patient Position: Sitting, Cuff Size: Large)   Pulse 94   Temp 97.7 F (36.5 C) (Oral)   Ht 5\' 3"  (1.6 m)   Wt 230 lb 12.8 oz (104.7 kg)   SpO2 95%   BMI 40.88 kg/m  General:   Alert,  Well-developed, well-nourished, pleasant and cooperative in NAD Neck:  Supple; no masses or thyromegaly. No significant cervical adenopathy. Lungs:  Clear throughout to auscultation.   No wheezes, crackles, or rhonchi. No acute distress. Heart:  Regular rate and rhythm; no murmurs, clicks, rubs,  or gallops. Abdomen: Non-distended, normal bowel sounds.  Soft and nontender without appreciable mass or hepatosplenomegaly.  Pulses:  Normal pulses noted. Extremities:  Without clubbing or edema.  Impression/Plan:    Very high triglycerides level may be the culprit in causing your pancreatitis  For now, continue pancreatic enzymes and Prilosec or omeprazole 20 mg daily  Will obtain a fecal elastase assay  Pancreatic  protocol CT-rule out small pancreatic neoplasm recent history of acute pancreatitis  office visit with [place] in 3 months  New prescription for Zofran 4 mg tablets.  Dispense  90 with 3 refills    Notice: This dictation was prepared with Dragon dictation along with smaller phrase technology. Any transcriptional errors that result from this process are unintentional and may not be corrected upon review.

## 2022-09-01 ENCOUNTER — Other Ambulatory Visit (INDEPENDENT_AMBULATORY_CARE_PROVIDER_SITE_OTHER): Payer: Self-pay | Admitting: *Deleted

## 2022-09-01 ENCOUNTER — Encounter (INDEPENDENT_AMBULATORY_CARE_PROVIDER_SITE_OTHER): Payer: Self-pay | Admitting: *Deleted

## 2022-09-01 DIAGNOSIS — Z8719 Personal history of other diseases of the digestive system: Secondary | ICD-10-CM

## 2022-09-08 DIAGNOSIS — R4020 Unspecified coma: Secondary | ICD-10-CM | POA: Diagnosis not present

## 2022-09-08 DIAGNOSIS — M25519 Pain in unspecified shoulder: Secondary | ICD-10-CM | POA: Diagnosis not present

## 2022-09-08 DIAGNOSIS — R030 Elevated blood-pressure reading, without diagnosis of hypertension: Secondary | ICD-10-CM | POA: Diagnosis not present

## 2022-09-08 DIAGNOSIS — S060XAA Concussion with loss of consciousness status unknown, initial encounter: Secondary | ICD-10-CM | POA: Diagnosis not present

## 2022-09-08 DIAGNOSIS — S161XXA Strain of muscle, fascia and tendon at neck level, initial encounter: Secondary | ICD-10-CM | POA: Diagnosis not present

## 2022-09-14 DIAGNOSIS — I10 Essential (primary) hypertension: Secondary | ICD-10-CM | POA: Diagnosis not present

## 2022-09-14 DIAGNOSIS — E119 Type 2 diabetes mellitus without complications: Secondary | ICD-10-CM | POA: Diagnosis not present

## 2022-09-14 DIAGNOSIS — L309 Dermatitis, unspecified: Secondary | ICD-10-CM | POA: Diagnosis not present

## 2022-09-14 DIAGNOSIS — E781 Pure hyperglyceridemia: Secondary | ICD-10-CM | POA: Diagnosis not present

## 2022-09-22 DIAGNOSIS — J209 Acute bronchitis, unspecified: Secondary | ICD-10-CM | POA: Diagnosis not present

## 2022-09-22 DIAGNOSIS — R0602 Shortness of breath: Secondary | ICD-10-CM | POA: Diagnosis not present

## 2022-09-22 DIAGNOSIS — Z1231 Encounter for screening mammogram for malignant neoplasm of breast: Secondary | ICD-10-CM | POA: Diagnosis not present

## 2022-09-23 ENCOUNTER — Other Ambulatory Visit (HOSPITAL_COMMUNITY): Payer: Self-pay | Admitting: Physician Assistant

## 2022-09-23 DIAGNOSIS — R9389 Abnormal findings on diagnostic imaging of other specified body structures: Secondary | ICD-10-CM

## 2022-09-26 DIAGNOSIS — R0602 Shortness of breath: Secondary | ICD-10-CM | POA: Diagnosis not present

## 2022-09-26 DIAGNOSIS — R079 Chest pain, unspecified: Secondary | ICD-10-CM | POA: Diagnosis not present

## 2022-09-26 DIAGNOSIS — E119 Type 2 diabetes mellitus without complications: Secondary | ICD-10-CM | POA: Diagnosis not present

## 2022-09-26 DIAGNOSIS — Z7984 Long term (current) use of oral hypoglycemic drugs: Secondary | ICD-10-CM | POA: Diagnosis not present

## 2022-09-26 DIAGNOSIS — M549 Dorsalgia, unspecified: Secondary | ICD-10-CM | POA: Diagnosis not present

## 2022-09-26 DIAGNOSIS — F32A Depression, unspecified: Secondary | ICD-10-CM | POA: Diagnosis not present

## 2022-09-26 DIAGNOSIS — Z79899 Other long term (current) drug therapy: Secondary | ICD-10-CM | POA: Diagnosis not present

## 2022-09-26 DIAGNOSIS — I10 Essential (primary) hypertension: Secondary | ICD-10-CM | POA: Diagnosis not present

## 2022-09-26 DIAGNOSIS — R0600 Dyspnea, unspecified: Secondary | ICD-10-CM | POA: Diagnosis not present

## 2022-09-26 DIAGNOSIS — E785 Hyperlipidemia, unspecified: Secondary | ICD-10-CM | POA: Diagnosis not present

## 2022-10-05 ENCOUNTER — Encounter (HOSPITAL_COMMUNITY): Payer: Self-pay

## 2022-10-05 ENCOUNTER — Ambulatory Visit (HOSPITAL_COMMUNITY): Payer: Medicare Other

## 2022-10-06 DIAGNOSIS — R0602 Shortness of breath: Secondary | ICD-10-CM | POA: Diagnosis not present

## 2022-10-06 DIAGNOSIS — M546 Pain in thoracic spine: Secondary | ICD-10-CM | POA: Diagnosis not present

## 2022-10-13 ENCOUNTER — Ambulatory Visit (HOSPITAL_BASED_OUTPATIENT_CLINIC_OR_DEPARTMENT_OTHER): Payer: Medicare Other | Admitting: Psychiatry

## 2022-10-13 ENCOUNTER — Encounter (HOSPITAL_COMMUNITY): Payer: Self-pay | Admitting: Psychiatry

## 2022-10-13 VITALS — BP 126/76 | HR 106 | Ht 63.0 in | Wt 228.0 lb

## 2022-10-13 DIAGNOSIS — F323 Major depressive disorder, single episode, severe with psychotic features: Secondary | ICD-10-CM | POA: Diagnosis not present

## 2022-10-13 MED ORDER — CLONAZEPAM 1 MG PO TABS: ORAL_TABLET | ORAL | 4 refills | Status: DC

## 2022-10-13 MED ORDER — ARIPIPRAZOLE 20 MG PO TABS: 20.0000 mg | ORAL_TABLET | Freq: Every day | ORAL | 1 refills | Status: DC

## 2022-10-13 MED ORDER — DULOXETINE HCL 60 MG PO CPEP: 120.0000 mg | ORAL_CAPSULE | Freq: Every day | ORAL | 4 refills | Status: DC

## 2022-10-13 MED ORDER — BUPROPION HCL ER (XL) 150 MG PO TB24: 450.0000 mg | ORAL_TABLET | Freq: Every day | ORAL | 5 refills | Status: DC

## 2022-10-13 NOTE — Progress Notes (Signed)
Patient ID: Jamie [name], female   DOB: 11-Sep-1970, 52 y.o.   MRN: [id number] [facility] MD Progress Note  10/13/2022 2:36 PM Jamie [name]  MRN:  [id number] Subjective:  Feeling well. Principal Problem: Major depression recurrent    Today the patient is seen with her husband Jamie [name].  The patient seems like she is doing okay.  Both of them agree that since the Abilify was increased to a dose of 20 mg she is better.  She is less distressed or depressed.  Her biggest problem is concerns about her pancreatitis.  She is having repeat CAT scan of her pancreas and continues to see a gastroenterologist.  She claims she has chronic pain from her pancreas.  We clarified that none of the psychiatric medicines are correlated with her developing pancreatitis.  They suspect the culprit is very high triglycerides.  The patient believes that it was due to Ozempic that she was going that induce the pancreatitis.  She had been taking Abilify for a longer period of time without any problems.  However the consideration of reducing the Abilify has always been in the cards.  The gastroenterologist excessive issue we will comply by reducing her Abilify.  The patient also needed Klonopin 1 in the morning and 2 at night.  Patient's mood is good.  She sleeps and eats fairly well.  She is not suicidal.  She been hospitalized 7 times in the past often with suicidal thinking.  Patient's biggest trauma has been the death of her nephew who killed himself.  Then within time.  Since that seen her her nephews girlfriend also overdosed and died in their apartment.  The patient does not seem to be very moody by all this.  She lives with her mother is doing well and her sister and her sisters supposedly boyfriend.  He recently lost his job and is applying for a new 1 at the end of the week.  The patient stays busy.  She looks a bit disheveled. Virtual Visit via Telephone Note  I connected with Jamie [name] on 05/13/2021 at  2:00 PM EDT  by telephone and verified that I am speaking with the correct person using two identifiers.  Location: Patient: home Provider: office   I discussed the limitations, risks, security and privacy concerns of performing an evaluation and management service by telephone and the availability of in person appointments. I also discussed with the patient that there may be a patient responsible charge related to this service. The patient expressed understanding and agreed to proceed.    I discussed the assessment and treatment plan with the patient. The patient was provided an opportunity to ask questions and all were answered. The patient agreed with the plan and demonstrated an understanding of the instructions.   The patient was advised to call back or seek an in-person evaluation if the symptoms worsen or if the condition fails to improve as anticipated.  I provided 30 minutes of non-face-to-face time during this encounter.   Jamie Ralph, MD   Past Medical History:  Diagnosis Date   Anxiety    Arthritis    knees   Bipolar 1 disorder ([place])    Chronic diarrhea    Diverticulosis of colon    GERD (gastroesophageal reflux disease)    Hiatal hernia    History of concussion    per pt 11/ 2020 w/ no residual   Hyperlipidemia    Hypertension    followed by pcp   (11-29-2019  per pt stated never had stress test)   MDD (major depressive disorder)    Migraine    neurologist--- dr Krista Blue   OSA on CPAP    followed by dr dohmeier   PCOS (polycystic ovarian syndrome)    PONV (postoperative nausea and vomiting)    Type 2 diabetes mellitus ([place])    followed by pcp   (11-29-2019  pt stated checks blood sugar daily in am,  fasting sugar--- 96    Past Surgical History:  Procedure Laterality Date   ANAL FISSURE REPAIR  x3  last one  2007  approx.   BIOPSY  09/06/2019   Procedure: BIOPSY;  Surgeon: Daneil Dolin, MD;  [facility]: [facility];  Service: Endoscopy;;   COLONOSCOPY WITH PROPOFOL  [name] 09/06/2019   pancolonic diverticulosis. Segmental biopsy completed with benign biopsies.    ESOPHAGOGASTRODUODENOSCOPY (EGD) WITH PROPOFOL [name] 06/16/2020   Dr. Gala Romney: normal esophagus s/p dilation, normal stomach, duodenum.    ESOPHAGOGASTRODUODENOSCOPY (EGD) WITH PROPOFOL [name] 02/10/2022   Procedure: ESOPHAGOGASTRODUODENOSCOPY (EGD) WITH PROPOFOL;  Surgeon: Daneil Dolin, MD;  Location: [facility];  Service: Endoscopy;  Laterality: [name];  9:45am   EVALUATION UNDER ANESTHESIA WITH HEMORRHOIDECTOMY [name] 12/05/2019   Procedure: ANORECTAL EXAM UNDER ANESTHESIA WITH HEMORRHOIDECTOMY, HEMORRHOIDAL LIGATION/PEXY;  Surgeon: Michael Boston, MD;  Location: [place];  Service: General;  Laterality: [name];  GENERAL AND LOCAL   EVALUATION UNDER ANESTHESIA WITH TEAR DUCT PROBING  infant   KNEE ARTHROSCOPY Right 1994   LAPAROSCOPIC CHOLECYSTECTOMY  08-06-2002  @[facility]    LUMBAR LAMINECTOMY/DECOMPRESSION MICRODISCECTOMY Right 01/22/2014   Procedure: LUMBAR FOUR TO FIVE LUMBAR LAMINECTOMY/DECOMPRESSION MICRODISCECTOMY 1 LEVEL;  Surgeon: Floyce Stakes, MD;  Location: [facility];  Service: Neurosurgery;  Laterality: Right;  Right L45 diskectomy   MALONEY DILATION [name] 06/16/2020   Procedure: Venia Minks DILATION;  Surgeon: Daneil Dolin, MD;  [facility];  Service: Endoscopy;  Laterality: [name];   MALONEY DILATION [name] 02/10/2022   Procedure: Venia Minks DILATION;  Surgeon: Daneil Dolin, MD;  [facility];  Service: Endoscopy;  Laterality: [name];   Family History:  Family History  Problem Relation Age of Onset   COPD Father    Anxiety disorder Maternal Grandmother    Depression Maternal Grandmother    Colon cancer Neg Hx    Colon polyps Neg Hx    Family Psychiatric  History:  Social History:  Social History   Substance and Sexual Activity  Alcohol Use No     Social History   Substance and Sexual Activity  Drug Use Never    Social History   Socioeconomic History    Marital status: Married    Spouse name: Not on file   Number of children: 0   Years of education: college   Highest education level: Not on file  Occupational History   Occupation: disabled  Tobacco Use   Smoking status: Never   Smokeless tobacco: Never  Vaping Use   Vaping Use: Never used  Substance and Sexual Activity   Alcohol use: No   Drug use: Never   Sexual activity: Yes    Partners: Male    Birth control/protection: None  Other Topics Concern   Not on file  Social History Narrative   Patient lives at home alone    Patient is right handed   Patient drinks soda's daily   Social Determinants of Health   Financial Resource Strain: Not on file  Food Insecurity: Not on file  Transportation Needs: Not on file  Physical Activity: Not on file  Stress: Not on file  Social Connections: Not on file   Additional Social History:                         Sleep: Fair  Appetite:  Good  Current Medications: Current Outpatient Medications  Medication Sig Dispense Refill   albuterol (VENTOLIN HFA) 108 (90 Base) MCG/ACT inhaler INHALE TWO PUFFS BY MOUTH EVERY 6 HOURS AS NEEDED FOR WHEEZING OR SHORTNESS OF BREATH 8.5 g 1   fluticasone (FLONASE) 50 MCG/ACT nasal spray Place 1 spray into both nostrils at bedtime as needed for allergies or rhinitis.      gabapentin (NEURONTIN) 300 MG capsule Take 300 mg by mouth at bedtime.     insulin glargine (LANTUS) 100 UNIT/ML injection Inject 33 Units into the skin daily.     lisinopril (PRINIVIL,ZESTRIL) 5 MG tablet Take 5 mg by mouth daily.     metFORMIN (GLUCOPHAGE-XR) 500 MG 24 hr tablet Take 500 mg by mouth daily.      NON FORMULARY Pt uses a cpap nightly     omeprazole (PRILOSEC) 20 MG capsule Take 20 mg by mouth daily.     ondansetron (ZOFRAN-ODT) 4 MG disintegrating tablet TAKE 1 TABLET BY MOUTH EVERY 8 HOURS AS NEEDED FOR NAUSEA AND VOMITING 90 tablet 3   Pancrelipase, Lip-Prot-Amyl, (ZENPEP) 40000-126000 units CPEP Take 2  capsules with meals and 1 capsule with snacks. 270 capsule 3   rosuvastatin (CRESTOR) 20 MG tablet Take 20 mg by mouth daily.     topiramate (TOPAMAX) 25 MG tablet Take 1 tablet (25 mg total) by mouth 2 (two) times daily. 60 tablet 5   traZODone (DESYREL) 100 MG tablet Take 4 tablets (400 mg total) by mouth at bedtime. 360 tablet 1   ARIPiprazole (ABILIFY) 20 MG tablet Take 1 tablet (20 mg total) by mouth daily. 90 tablet 1   buPROPion (WELLBUTRIN XL) 150 MG 24 hr tablet Take 3 tablets (450 mg total) by mouth daily. 90 tablet 5   clonazePAM (KLONOPIN) 1 MG tablet Take 1 mg in the morning and 2 mg at night 90 tablet 4   DULoxetine (CYMBALTA) 60 MG capsule Take 2 capsules (120 mg total) by mouth at bedtime. 60 capsule 4   No current facility-administered medications for this visit.    Lab Results: No results found for this or any previous visit (from the past 48 hour(s)).  Physical Findings: AIMS:  , ,  ,  ,    CIWA:    COWS:     Musculoskeletal: Strength & Muscle Tone: within normal limits Gait & Station: normal Patient leans: Right  Psychiatric Specialty Exam: ROS  Blood pressure 126/76, pulse (!) 106, height 5\' 3"  (1.6 m), weight 228 lb (103.4 kg).Body mass index is 40.39 kg/m.  General Appearance: Casual  Eye Contact::  Good  Speech:  Clear and Coherent  Volume:  Normal  Mood:Depressed  Affect:  Appropriate  Today the patient is doing very well. Thought Process:  Coherent  Orientation:  Full (Time, Place, and Person)  Thought Content:  NA and WDL  Suicidal Thoughts:  yes  Homicidal Thoughts:  No  Memory:  Immediate;   NA  Judgement:  Good  Insight:  Good  Psychomotor Activity:  NA  Concentration:  Good  Recall:  Good  Fund of Knowledge:Good  Language: Good  Akathisia:  No  Handed:  Right  AIMS (  if indicated):     Assets:  Desire for Improvement  ADL's:  Intact  Cognition: WNL  Sleep:      Treatment Plan Summary: 10/13/2022, 2:36 PM  This patient's diagnosis  is major depression with psychosis.  Fortunately she has no clear evidence of psychosis at this time.  The Abilify is being used as adjunct treatment with 450 mg of Wellbutrin and 120 mg of Cymbalta.  The patient also continues in therapy.  Her second problem is adjustment disorder with an anxious mood state.  This is likely related to multiple physical illnesses.  To actively try to get treatment and get her pancreatitis resolved.  For now she will continue taking Klonopin 1 mg 1 in the morning and 2 at night.  She will return to see me in 3 months.  Overall the patient is functioning reasonably well.  She clearly is not suicidal.

## 2022-10-18 ENCOUNTER — Ambulatory Visit (HOSPITAL_COMMUNITY)
Admission: RE | Admit: 2022-10-18 | Discharge: 2022-10-18 | Disposition: A | Discharge status: Home / Self Care | Payer: Medicare Other | Source: Ambulatory Visit | Attending: Internal Medicine | Admitting: Internal Medicine

## 2022-10-18 ENCOUNTER — Encounter (HOSPITAL_COMMUNITY): Payer: Self-pay | Admitting: Radiology

## 2022-10-18 DIAGNOSIS — Z8719 Personal history of other diseases of the digestive system: Secondary | ICD-10-CM | POA: Insufficient documentation

## 2022-10-18 DIAGNOSIS — Z9049 Acquired absence of other specified parts of digestive tract: Secondary | ICD-10-CM | POA: Diagnosis not present

## 2022-10-18 DIAGNOSIS — K760 Fatty (change of) liver, not elsewhere classified: Secondary | ICD-10-CM | POA: Diagnosis not present

## 2022-10-18 MED ORDER — IOHEXOL 300 MG/ML  SOLN
100.0000 mL | Freq: Once | INTRAMUSCULAR | Status: AC | PRN
  Administered 2022-10-18: 100 mL via INTRAVENOUS

## 2022-10-20 DIAGNOSIS — R059 Cough, unspecified: Secondary | ICD-10-CM | POA: Diagnosis not present

## 2022-10-20 DIAGNOSIS — R062 Wheezing: Secondary | ICD-10-CM | POA: Diagnosis not present

## 2022-10-20 DIAGNOSIS — R0789 Other chest pain: Secondary | ICD-10-CM | POA: Diagnosis not present

## 2022-10-20 DIAGNOSIS — R030 Elevated blood-pressure reading, without diagnosis of hypertension: Secondary | ICD-10-CM | POA: Diagnosis not present

## 2022-11-04 DIAGNOSIS — G4700 Insomnia, unspecified: Secondary | ICD-10-CM | POA: Diagnosis not present

## 2022-11-04 DIAGNOSIS — G4710 Hypersomnia, unspecified: Secondary | ICD-10-CM | POA: Diagnosis not present

## 2022-11-04 DIAGNOSIS — I10 Essential (primary) hypertension: Secondary | ICD-10-CM | POA: Diagnosis not present

## 2022-11-04 DIAGNOSIS — G4733 Obstructive sleep apnea (adult) (pediatric): Secondary | ICD-10-CM | POA: Diagnosis not present

## 2022-11-10 ENCOUNTER — Institutional Professional Consult (permissible substitution): Payer: Medicare Other | Admitting: Pulmonary Disease

## 2022-11-23 DIAGNOSIS — E782 Mixed hyperlipidemia: Secondary | ICD-10-CM | POA: Diagnosis not present

## 2022-11-23 DIAGNOSIS — E1165 Type 2 diabetes mellitus with hyperglycemia: Secondary | ICD-10-CM | POA: Diagnosis not present

## 2022-11-29 ENCOUNTER — Ambulatory Visit (INDEPENDENT_AMBULATORY_CARE_PROVIDER_SITE_OTHER): Payer: Medicare Other | Admitting: Pulmonary Disease

## 2022-11-29 ENCOUNTER — Encounter: Payer: Self-pay | Admitting: Pulmonary Disease

## 2022-11-29 VITALS — BP 118/76 | HR 92 | Ht 63.0 in | Wt 228.0 lb

## 2022-11-29 DIAGNOSIS — R0602 Shortness of breath: Secondary | ICD-10-CM | POA: Diagnosis not present

## 2022-11-29 DIAGNOSIS — G4733 Obstructive sleep apnea (adult) (pediatric): Secondary | ICD-10-CM

## 2022-11-29 DIAGNOSIS — R079 Chest pain, unspecified: Secondary | ICD-10-CM

## 2022-11-29 DIAGNOSIS — R942 Abnormal results of pulmonary function studies: Secondary | ICD-10-CM | POA: Diagnosis not present

## 2022-11-29 MED ORDER — TRELEGY ELLIPTA 100-62.5-25 MCG/ACT IN AEPB: 1.0000 | INHALATION_SPRAY | Freq: Every day | RESPIRATORY_TRACT | 0 refills | Status: DC

## 2022-11-29 NOTE — Progress Notes (Signed)
Synopsis: Referred in May 2024 for shortness of breath  Subjective:   PATIENT ID: Jamie [name] GENDER: female DOB: January 15, 1971, MRN: [id number]  HPI  Chief Complaint  Patient presents with   Consult    Referred by PCP for increased SOB. States her SOB was under control since last visit in 2021 but recently returned. Denies any wheezing or coughing. Currently on Symbicort 160 twice daily.    Jamie [name] is a 52 year old woman, never smoker with history of hiatal hernia, GERD, hypertension, OSA and DMII who is referred to pulmonary clinic for shortness of breath.   She reports increased shortness of breath since having pancreatitis and Covid 19 infection earlier this year. The dyspnea is worse with exertion. She has productive cough with white phlegm. She has intermittent chills but no fevers. These symptoms have progressed over the past month. She continues to feel like food and drink get stuck in her throat. She has had esophageal dilation two times via EGD, most recently last fall. She has follow up with GI tomorrow.  She is using CPAP nightly for OSA. She has intermittent wheezing. She is coughing up white phlegm. She has chills, over the past month. No fevers.   No seasonal allergies. She has reflux and is taking omeprazole.   She is a never smoker. She had second hand smoke exposure in childhood from her father. Her husband currently smokes in the house.    Past Medical History:  Diagnosis Date   Anxiety    Arthritis    knees   Bipolar 1 disorder ([facility])    Chronic diarrhea    Diverticulosis of colon    GERD (gastroesophageal reflux disease)    Hiatal hernia    History of concussion    per pt 11/ 2020 w/ no residual   Hyperlipidemia    Hypertension    followed by pcp   (11-29-2019  per pt stated never had stress test)   MDD (major depressive disorder)    Migraine    neurologist--- dr Terrace [place]   OSA on CPAP    followed by dr dohmeier   PCOS (polycystic ovarian  syndrome)    PONV (postoperative nausea and vomiting)    Type 2 diabetes mellitus ([facility])    followed by pcp   (11-29-2019  pt stated checks blood sugar daily in am,  fasting sugar--- 96     Family History  Problem Relation Age of Onset   COPD Father    Anxiety disorder Maternal Grandmother    Depression Maternal Grandmother    Colon cancer Neg Hx    Colon polyps Neg Hx      Social History   Socioeconomic History   Marital status: Married    Spouse name: Not on file   Number of children: 0   Years of education: college   Highest education level: Not on file  Occupational History   Occupation: disabled  Tobacco Use   Smoking status: Never   Smokeless tobacco: Never  Vaping Use   Vaping Use: Never used  Substance and Sexual Activity   Alcohol use: No   Drug use: Never   Sexual activity: Yes    Partners: Male    Birth control/protection: None  Other Topics Concern   Not on file  Social History Narrative   Patient lives at home alone    Patient is right handed   Patient drinks soda's daily   Social Determinants of Health   Financial Resource Strain:  Not on file  Food Insecurity: Not on file  Transportation Needs: Not on file  Physical Activity: Not on file  Stress: Not on file  Social Connections: Not on file  Intimate Partner Violence: Not on file     Allergies  Allergen Reactions   Codeine Hives and Swelling   Morphine And Related Swelling and Rash     Outpatient Medications Prior to Visit  Medication Sig Dispense Refill   albuterol (VENTOLIN HFA) 108 (90 Base) MCG/ACT inhaler INHALE TWO PUFFS BY MOUTH EVERY 6 HOURS AS NEEDED FOR WHEEZING OR SHORTNESS OF BREATH 8.5 g 1   ARIPiprazole (ABILIFY) 20 MG tablet Take 1 tablet (20 mg total) by mouth daily. 90 tablet 1   buPROPion (WELLBUTRIN XL) 150 MG 24 hr tablet Take 3 tablets (450 mg total) by mouth daily. 90 tablet 5   clonazePAM (KLONOPIN) 1 MG tablet Take 1 mg in the morning and 2 mg at night 90 tablet 4    DULoxetine (CYMBALTA) 60 MG capsule Take 2 capsules (120 mg total) by mouth at bedtime. 60 capsule 4   fluticasone (FLONASE) 50 MCG/ACT nasal spray Place 1 spray into both nostrils at bedtime as needed for allergies or rhinitis.      gabapentin (NEURONTIN) 300 MG capsule Take 300 mg by mouth at bedtime.     insulin glargine (LANTUS) 100 UNIT/ML injection Inject 33 Units into the skin daily.     lisinopril (PRINIVIL,ZESTRIL) 5 MG tablet Take 5 mg by mouth daily.     metFORMIN (GLUCOPHAGE-XR) 500 MG 24 hr tablet Take 500 mg by mouth daily.      NON FORMULARY Pt uses a cpap nightly     omeprazole (PRILOSEC) 20 MG capsule Take 20 mg by mouth daily.     ondansetron (ZOFRAN-ODT) 4 MG disintegrating tablet TAKE 1 TABLET BY MOUTH EVERY 8 HOURS AS NEEDED FOR NAUSEA AND VOMITING 90 tablet 3   Pancrelipase, Lip-Prot-Amyl, (ZENPEP) 40000-126000 units CPEP Take 2 capsules with meals and 1 capsule with snacks. 270 capsule 3   rosuvastatin (CRESTOR) 20 MG tablet Take 20 mg by mouth daily.     SYMBICORT 160-4.5 MCG/ACT inhaler Inhale 2 puffs into the lungs 2 (two) times daily.     topiramate (TOPAMAX) 25 MG tablet Take 1 tablet (25 mg total) by mouth 2 (two) times daily. 60 tablet 5   traZODone (DESYREL) 100 MG tablet Take 4 tablets (400 mg total) by mouth at bedtime. 360 tablet 1   No facility-administered medications prior to visit.   Review of Systems  Constitutional:  Negative for chills, fever, malaise/fatigue and weight loss.  HENT:  Negative for congestion, sinus pain and sore throat.   Eyes: Negative.   Respiratory:  Positive for cough, sputum production, shortness of breath and wheezing. Negative for hemoptysis.   Cardiovascular:  Positive for chest pain. Negative for palpitations, orthopnea, claudication and leg swelling.  Gastrointestinal:  Positive for abdominal pain and heartburn. Negative for nausea and vomiting.  Genitourinary: Negative.   Musculoskeletal:  Positive for joint pain.  Negative for myalgias.  Skin:  Negative for rash.  Neurological:  Positive for headaches. Negative for weakness.  Endo/Heme/Allergies: Negative.   Psychiatric/Behavioral:  Positive for depression.    Objective:   Vitals:   11/29/22 1325  BP: 118/76  Pulse: 92  SpO2: 96%  Weight: 228 lb (103.4 kg)  Height: 5\' 3"  (1.6 m)   Physical Exam Constitutional:      General: She is not in acute distress.  Appearance: She is obese. She is not ill-appearing.  HENT:     Head: Normocephalic and atraumatic.  Eyes:     General: No scleral icterus.    Conjunctiva/sclera: Conjunctivae normal.     Pupils: Pupils are equal, round, and reactive to light.  Cardiovascular:     Rate and Rhythm: Normal rate and regular rhythm.     Pulses: Normal pulses.     Heart sounds: Normal heart sounds. No murmur heard. Pulmonary:     Effort: Pulmonary effort is normal.     Breath sounds: Normal breath sounds. No wheezing, rhonchi or rales.  Musculoskeletal:     Right lower leg: No edema.     Left lower leg: No edema.  Lymphadenopathy:     Cervical: No cervical adenopathy.  Skin:    General: Skin is warm and dry.  Neurological:     General: No focal deficit present.     Mental Status: She is alert.    CBC    Component Value Date/Time   WBC 7.4 06/03/2022 0812   RBC 4.71 06/03/2022 0812   HGB 13.6 06/03/2022 0812   HGB 13.2 09/16/2011 1753   HCT 40.8 06/03/2022 0812   HCT 39.3 09/16/2011 1753   PLT 169 06/03/2022 0812   PLT 204 09/16/2011 1753   MCV 86.6 06/03/2022 0812   MCV 89 09/16/2011 1753   MCH 28.9 06/03/2022 0812   MCHC 33.3 06/03/2022 0812   RDW 14.5 06/03/2022 0812   RDW 14.4 09/16/2011 1753   LYMPHSABS 3,419 06/03/2022 0812   MONOABS 0.8 02/22/2020 1145   EOSABS 96 06/03/2022 0812   BASOSABS 67 06/03/2022 0812      Latest Ref Rng & Units 06/03/2022    8:12 AM 02/05/2022    9:37 AM 06/12/2020    3:10 PM  BMP  Glucose 65 - 99 mg/dL 161  096  045   BUN 7 - 25 mg/dL 25  24   17    Creatinine 0.50 - 1.03 mg/dL 4.09  8.11  9.14   BUN/Creat Ratio 6 - 22 (calc) 21     Sodium 135 - 146 mmol/L 136  138  136   Potassium 3.5 - 5.3 mmol/L 4.5  4.1  3.9   Chloride 98 - 110 mmol/L 99  105  102   CO2 20 - 32 mmol/L 25  23  24    Calcium 8.6 - 10.4 mg/dL 78.2  9.4  9.3    Chest imaging: CTA Chest 09/26/22 Cardiovascular: No filling defects in the pulmonary arteries to  suggest pulmonary emboli. Heart is normal size. Aorta is normal  caliber. Scattered coronary artery calcifications.   Mediastinum/Nodes: No mediastinal, hilar, or axillary adenopathy.  Trachea and esophagus are unremarkable. Thyroid unremarkable.   Lungs/Pleura: No confluent airspace opacities or effusions. No  suspicious pulmonary nodules. Density seen on prior chest x-ray  corresponds to spurring/osteophytes along the anterior 1st rib.   PFT:    Latest Ref Rng & Units 02/22/2020    9:51 AM  PFT Results  FVC-Pre L 2.36   FVC-Predicted Pre % 68   FVC-Post L 2.39   FVC-Predicted Post % 69   Pre FEV1/FVC % % 88   Post FEV1/FCV % % 90   FEV1-Pre L 2.06   FEV1-Predicted Pre % 74   FEV1-Post L 2.15   DLCO uncorrected ml/min/mmHg 26.65   DLCO UNC% % 129   DLCO corrected ml/min/mmHg 26.65   DLCO COR %Predicted % 129   DLVA Predicted %  186   TLC L 3.80   TLC % Predicted % 77   RV % Predicted % 89   PFT 2021: Mild restriction and increased diffusion  Labs:  Path:  Echo 9/202021: LV EF 70-75%, hyperdynamic function. RV size and systolic function are normal.   Heart Catheterization:       Assessment & Plan:   Restrictive ventilatory defect - Plan: Pulmonary Function Test  OSA on CPAP  Shortness of breath  Chest pain, unspecified type - Plan: Ambulatory referral to Cardiology  Discussion: Jamie [name] is a 52 year old woman, never smoker with history of hiatal hernia, GERD, hypertension, OSA and DMII who is referred to pulmonary clinic for shortness of breath.   She may have  underlying asthma with cough and wheezing along with dyspnea. The dyspnea may also be related to obesity with deconditioning. She also is at risk for aspiration based on her reported history of dysphagia.   She is to try trelegy ellipta 1 puff daily and hold symbicort. She is to let us know if she would like to continue this medication. She can continue albuterol as needed.  She is to continue CPAP for OSA.  Referral placed to cardiology for chest pain, dyspnea and intermittent leg swelling. Echo from 2021 showed hyperdynamic LV with EF 70-75%.  Follow up in 2 months with pulmonary function tests.  Jamie Comas, MD [facility] Office: [phone]    Current Outpatient Medications:    albuterol (VENTOLIN HFA) 108 (90 Base) MCG/ACT inhaler, INHALE TWO PUFFS BY MOUTH EVERY 6 HOURS AS NEEDED FOR WHEEZING OR SHORTNESS OF BREATH, Disp: 8.5 g, Rfl: 1   ARIPiprazole (ABILIFY) 20 MG tablet, Take 1 tablet (20 mg total) by mouth daily., Disp: 90 tablet, Rfl: 1   buPROPion (WELLBUTRIN XL) 150 MG 24 hr tablet, Take 3 tablets (450 mg total) by mouth daily., Disp: 90 tablet, Rfl: 5   clonazePAM (KLONOPIN) 1 MG tablet, Take 1 mg in the morning and 2 mg at night, Disp: 90 tablet, Rfl: 4   DULoxetine (CYMBALTA) 60 MG capsule, Take 2 capsules (120 mg total) by mouth at bedtime., Disp: 60 capsule, Rfl: 4   fluticasone (FLONASE) 50 MCG/ACT nasal spray, Place 1 spray into both nostrils at bedtime as needed for allergies or rhinitis. , Disp: , Rfl:    gabapentin (NEURONTIN) 300 MG capsule, Take 300 mg by mouth at bedtime., Disp: , Rfl:    insulin glargine (LANTUS) 100 UNIT/ML injection, Inject 33 Units into the skin daily., Disp: , Rfl:    lisinopril (PRINIVIL,ZESTRIL) 5 MG tablet, Take 5 mg by mouth daily., Disp: , Rfl:    metFORMIN (GLUCOPHAGE-XR) 500 MG 24 hr tablet, Take 500 mg by mouth daily. , Disp: , Rfl:    NON FORMULARY, Pt uses a cpap nightly, Disp: , Rfl:    omeprazole  (PRILOSEC) 20 MG capsule, Take 20 mg by mouth daily., Disp: , Rfl:    ondansetron (ZOFRAN-ODT) 4 MG disintegrating tablet, TAKE 1 TABLET BY MOUTH EVERY 8 HOURS AS NEEDED FOR NAUSEA AND VOMITING, Disp: 90 tablet, Rfl: 3   Pancrelipase, Lip-Prot-Amyl, (ZENPEP) 40000-126000 units CPEP, Take 2 capsules with meals and 1 capsule with snacks., Disp: 270 capsule, Rfl: 3   rosuvastatin (CRESTOR) 20 MG tablet, Take 20 mg by mouth daily., Disp: , Rfl:    SYMBICORT 160-4.5 MCG/ACT inhaler, Inhale 2 puffs into the lungs 2 (two) times daily., Disp: , Rfl:    topiramate (TOPAMAX) 25 MG tablet,  Take 1 tablet (25 mg total) by mouth 2 (two) times daily., Disp: 60 tablet, Rfl: 5   traZODone (DESYREL) 100 MG tablet, Take 4 tablets (400 mg total) by mouth at bedtime., Disp: 360 tablet, Rfl: 1

## 2022-11-29 NOTE — Patient Instructions (Addendum)
Try trelegy ellipta 1 puff daily  - rinse mouth out after each use  Stop symbicort while trying the trelegy  Continue to use albuterol 1-2 puffs every 4-6 hours as needed  We will refer you to cardiology for evaluation of chest pains and shortness of breath  Follow up in 2 months with pulmonary function tests

## 2022-11-29 NOTE — Addendum Note (Signed)
Addended by: Maurene Capes on: 11/29/2022 02:19 PM   Modules accepted: Orders

## 2022-11-30 ENCOUNTER — Encounter: Payer: Self-pay | Admitting: Internal Medicine

## 2022-11-30 ENCOUNTER — Ambulatory Visit: Payer: Medicare Other | Admitting: Internal Medicine

## 2022-11-30 VITALS — BP 110/76 | HR 98 | Temp 98.2°F | Ht 63.0 in | Wt 230.2 lb

## 2022-11-30 DIAGNOSIS — Z8719 Personal history of other diseases of the digestive system: Secondary | ICD-10-CM | POA: Diagnosis not present

## 2022-11-30 NOTE — Progress Notes (Unsigned)
Primary Care Physician:  Joeseph Amor Primary Gastroenterologist:  Dr. [name]  Pre-Procedure History & Physical: HPI:  Jamie [name] is a 52 y.o. female here for acute pancreatitis in the setting of markedly elevated triglycerides.  Former EtOH but none now.  Recent labs up at [facility] are not demonstrated Chem-12 look good as far as liver is concerned except for slightly elevated AST of 38.  Pancreas looked entirely normal on recent CT pancreatic protocol CT.  Mild hepatic steatosis.  He is stable at 230 pounds.  Dysphagia improved since she underwent esophageal dilation last year.  Negative colonoscopy 2021; due for average rescreening 2031.  No alcohol in many years.  Chronic diarrhea significantly improved going from 6-7 bouts of diarrhea daily with some incontinence stool to 1-3 more formed bowel movements recently  Past Medical History:  Diagnosis Date   Anxiety    Arthritis    knees   Bipolar 1 disorder (HCC)    Chronic diarrhea    Diverticulosis of colon    GERD (gastroesophageal reflux disease)    Hiatal hernia    History of concussion    per pt 11/ 2020 w/ no residual   Hyperlipidemia    Hypertension    followed by pcp   (11-29-2019  per pt stated never had stress test)   MDD (major depressive disorder)    Migraine    neurologist--- dr Terrace Arabia   OSA on CPAP    followed by dr dohmeier   PCOS (polycystic ovarian syndrome)    PONV (postoperative nausea and vomiting)    Type 2 diabetes mellitus (HCC)    followed by pcp   (11-29-2019  pt stated checks blood sugar daily in am,  fasting sugar--- 96    Past Surgical History:  Procedure Laterality Date   ANAL FISSURE REPAIR  x3  last one  2007  approx.   BIOPSY  09/06/2019   Procedure: BIOPSY;  Surgeon: [name], MD;  Location: [facility];  Service: Endoscopy;;   COLONOSCOPY WITH PROPOFOL [name] 09/06/2019   pancolonic diverticulosis. Segmental biopsy completed with benign biopsies.     ESOPHAGOGASTRODUODENOSCOPY (EGD) WITH PROPOFOL [name] 06/16/2020   Dr. [name]: normal esophagus s/p dilation, normal stomach, duodenum.    ESOPHAGOGASTRODUODENOSCOPY (EGD) WITH PROPOFOL [name] 02/10/2022   Procedure: ESOPHAGOGASTRODUODENOSCOPY (EGD) WITH PROPOFOL;  Surgeon: [name], MD;  Location: [facility];  Service: Endoscopy;  Laterality: [name];  9:45am   EVALUATION UNDER ANESTHESIA WITH HEMORRHOIDECTOMY [name] 12/05/2019   Procedure: ANORECTAL EXAM UNDER ANESTHESIA WITH HEMORRHOIDECTOMY, HEMORRHOIDAL LIGATION/PEXY;  Surgeon: [name], MD;  Location: [facility];  Service: General;  Laterality: [name];  GENERAL AND LOCAL   EVALUATION UNDER ANESTHESIA WITH TEAR DUCT PROBING  infant   KNEE ARTHROSCOPY Right 1994   LAPAROSCOPIC CHOLECYSTECTOMY  08-06-2002  @[facility]    LUMBAR LAMINECTOMY/DECOMPRESSION MICRODISCECTOMY Right 01/22/2014   Procedure: LUMBAR FOUR TO FIVE LUMBAR LAMINECTOMY/DECOMPRESSION MICRODISCECTOMY 1 LEVEL;  Surgeon: Karn Cassis, MD;  Location: [facility];  Service: Neurosurgery;  Laterality: Right;  Right L45 diskectomy   MALONEY DILATION [name] 06/16/2020   Procedure: Elease [name] DILATION;  Surgeon: [name], MD;  Location: [facility];  Service: Endoscopy;  Laterality: [name];   MALONEY DILATION [name] 02/10/2022   Procedure: Elease [name] DILATION;  Surgeon: [name], MD;  Location: [facility];  Service: Endoscopy;  Laterality: [name];    Prior to Admission medications   Medication Sig Start Date End Date Taking? Authorizing  Provider  albuterol (VENTOLIN HFA) 108 (90 Base) MCG/ACT inhaler INHALE TWO PUFFS BY MOUTH EVERY 6 HOURS AS NEEDED FOR WHEEZING OR SHORTNESS OF BREATH 04/04/20  Yes Glenford Bayley, NP  ARIPiprazole (ABILIFY) 20 MG tablet Take 1 tablet (20 mg total) by mouth daily. 10/13/22  Yes Plovsky, Earvin Hansen, MD  buPROPion (WELLBUTRIN XL) 150 MG 24 hr tablet Take 3 tablets (450 mg total) by mouth daily. 10/13/22  Yes Plovsky, Earvin Hansen, MD  clonazePAM  (KLONOPIN) 1 MG tablet Take 1 mg in the morning and 2 mg at night 10/13/22  Yes Plovsky, Earvin Hansen, MD  DULoxetine (CYMBALTA) 60 MG capsule Take 2 capsules (120 mg total) by mouth at bedtime. 10/13/22  Yes Plovsky, Earvin Hansen, MD  fluticasone (FLONASE) 50 MCG/ACT nasal spray Place 1 spray into both nostrils at bedtime as needed for allergies or rhinitis.    Yes [provider]  Fluticasone-Umeclidin-Vilant (TRELEGY ELLIPTA) 100-62.5-25 MCG/ACT AEPB Inhale 1 puff into the lungs daily. 11/29/22  Yes Martina Sinner, MD  gabapentin (NEURONTIN) 300 MG capsule Take 300 mg by mouth at bedtime. 02/19/20  Yes [provider]  insulin glargine (LANTUS) 100 UNIT/ML injection Inject 33 Units into the skin daily.   Yes [provider]  insulin lispro (HUMALOG) 100 UNIT/ML KwikPen Inject 3 Units into the skin 3 (three) times daily. 10/27/22  Yes [provider]  lisinopril (PRINIVIL,ZESTRIL) 5 MG tablet Take 5 mg by mouth daily.   Yes [provider]  metFORMIN (GLUCOPHAGE-XR) 500 MG 24 hr tablet Take 500 mg by mouth daily.  07/29/19  Yes [provider]  NON FORMULARY Pt uses a cpap nightly   Yes [provider]  omega-3 acid ethyl esters (LOVAZA) 1 g capsule Take 4 capsules by mouth daily. 10/22/22  Yes [provider]  omeprazole (PRILOSEC) 20 MG capsule Take 20 mg by mouth daily.   Yes [provider]  ondansetron (ZOFRAN-ODT) 4 MG disintegrating tablet TAKE 1 TABLET BY MOUTH EVERY 8 HOURS AS NEEDED FOR NAUSEA AND VOMITING 08/31/22  Yes Radames Mejorado, Gerrit Friends, MD  Pancrelipase, Lip-Prot-Amyl, (ZENPEP) 40000-126000 units CPEP Take 2 capsules with meals and 1 capsule with snacks. 05/25/22  Yes Brodee Mauritz, Gerrit Friends, MD  rosuvastatin (CRESTOR) 20 MG tablet Take 20 mg by mouth daily.   Yes [provider]  SYMBICORT 160-4.5 MCG/ACT inhaler Inhale 2 puffs into the lungs 2 (two) times daily. 10/20/22  Yes [provider]  topiramate (TOPAMAX) 25  MG tablet Take 1 tablet (25 mg total) by mouth 2 (two) times daily. 12/03/21  Yes McCue, Shanda Bumps, NP  traZODone (DESYREL) 100 MG tablet Take 4 tablets (400 mg total) by mouth at bedtime. 07/14/22  Yes Plovsky, Earvin Hansen, MD    Allergies as of 11/30/2022 - Review Complete 11/30/2022  Allergen Reaction Noted   Codeine Hives and Swelling 06/07/2011   Morphine and related Swelling and Rash 01/21/2014    Family History  Problem Relation Age of Onset   COPD Father    Anxiety disorder Maternal Grandmother    Depression Maternal Grandmother    Colon cancer Neg Hx    Colon polyps Neg Hx     Social History   Socioeconomic History   Marital status: Married    Spouse name: Not on file   Number of children: 0   Years of education: college   Highest education level: Not on file  Occupational History   Occupation: disabled  Tobacco Use   Smoking status: Never   Smokeless tobacco:  Never  Vaping Use   Vaping Use: Never used  Substance and Sexual Activity   Alcohol use: No   Drug use: Never   Sexual activity: Yes    Partners: Male    Birth control/protection: None  Other Topics Concern   Not on file  Social History Narrative   Patient lives at home alone    Patient is right handed   Patient drinks soda's daily   Social Determinants of Health   Financial Resource Strain: Not on file  Food Insecurity: Not on file  Transportation Needs: Not on file  Physical Activity: Not on file  Stress: Not on file  Social Connections: Not on file  Intimate Partner Violence: Not on file    Review of Systems: See HPI, otherwise negative ROS  Physical Exam: BP 110/76 (BP Location: Left Arm, Patient Position: Sitting, Cuff Size: Large)   Pulse 98   Temp 98.2 F (36.8 C) (Oral)   Ht 5\' 3"  (1.6 m)   Wt 230 lb 3.2 oz (104.4 kg)   SpO2 94%   BMI 40.78 kg/m  General:   Alert,   pleasant and cooperative in NAD Lungs:  Clear throughout to auscultation.   No wheezes, crackles, or rhonchi. No  acute distress. Heart:  Regular rate and rhythm; no murmurs, clicks, rubs,  or gallops. Abdomen: Centrally obese.  Soft and nontender without appreciable mass organomegaly  Extremities:  Without clubbing or edema.  Impression/Plan: 52 year old centrally obese lady with a distant history of alcohol use presents with recent acute pancreatitis serum triglycerides greater than 1700.  Untreated previously now on treatment.  Pancreatic protocol CT recently negative for occult neoplasm in fact, the pancreas appeared normal significant improvement in diarrhea with empiric pancreatic enzyme supplementation.  GERD well-controlled as well on PPI  Would like to see a low fecal elastase to nail down the diagnosis.  She likely has NAFLD at this time.  Recommendations:  I am glad you are getting your triglycerides under control.  Sounds like taking pancreatic enzymes has helped your diarrhea considerably.  We need to get the stool test to see if your enzyme levels are still low.  If they are, you will need pancreatic enzymes on a daily indefinite basis.  Continue pancreatic enzymes for now.  Stool sample for fecal elastase.  Further recommendations to follow.  Weight loss and better glycemic control.  Office visit with [place] and 6 months  Screening colonoscopy 2031.     Notice: This dictation was prepared with Dragon dictation along with smaller phrase technology. Any transcriptional errors that result from this process are unintentional and may not be corrected upon review.

## 2022-11-30 NOTE — Patient Instructions (Addendum)
It was good seeing you today!  I am glad you are getting your triglycerides under control.  Sounds like taking pancreatic enzymes has helped your diarrhea considerably.  We need to get the stool test to see if your enzyme levels are still low.  If they are, you will need pancreatic enzymes on a daily indefinite basis.  Continue pancreatic enzymes for now.  Office visit with [place] and 6 months  Screening colonoscopy 2031.

## 2022-12-01 DIAGNOSIS — Z8719 Personal history of other diseases of the digestive system: Secondary | ICD-10-CM | POA: Diagnosis not present

## 2022-12-02 DIAGNOSIS — R609 Edema, unspecified: Secondary | ICD-10-CM | POA: Diagnosis not present

## 2022-12-06 ENCOUNTER — Telehealth: Payer: Self-pay | Admitting: Pulmonary Disease

## 2022-12-06 DIAGNOSIS — R942 Abnormal results of pulmonary function studies: Secondary | ICD-10-CM

## 2022-12-06 MED ORDER — TRELEGY ELLIPTA 100-62.5-25 MCG/ACT IN AEPB
1.0000 | INHALATION_SPRAY | Freq: Every day | RESPIRATORY_TRACT | 5 refills | Status: DC
Start: 2022-12-06 — End: 2023-06-28

## 2022-12-06 NOTE — Telephone Encounter (Signed)
Okay to provide patient with Trelegy prescription?

## 2022-12-06 NOTE — Telephone Encounter (Signed)
Prescription sent to her pharmacy.   JD

## 2022-12-06 NOTE — Telephone Encounter (Signed)
Pt calling in bc she received a sample of trelegy and it helped her, so she is wanting a prescription for it sent back as well.

## 2022-12-07 NOTE — Telephone Encounter (Signed)
Trelegy has been sent to pharmacy. Patient is aware. NFN

## 2022-12-08 DIAGNOSIS — I10 Essential (primary) hypertension: Secondary | ICD-10-CM | POA: Diagnosis not present

## 2022-12-08 DIAGNOSIS — R739 Hyperglycemia, unspecified: Secondary | ICD-10-CM | POA: Diagnosis not present

## 2022-12-08 DIAGNOSIS — K219 Gastro-esophageal reflux disease without esophagitis: Secondary | ICD-10-CM | POA: Diagnosis not present

## 2022-12-08 DIAGNOSIS — E1165 Type 2 diabetes mellitus with hyperglycemia: Secondary | ICD-10-CM | POA: Diagnosis not present

## 2022-12-09 LAB — PANCREATIC ELASTASE, FECAL: Pancreatic Elastase-1, Stool: 435 mcg/g

## 2022-12-13 DIAGNOSIS — E1140 Type 2 diabetes mellitus with diabetic neuropathy, unspecified: Secondary | ICD-10-CM | POA: Diagnosis not present

## 2022-12-13 DIAGNOSIS — M7732 Calcaneal spur, left foot: Secondary | ICD-10-CM | POA: Diagnosis not present

## 2022-12-13 DIAGNOSIS — M7731 Calcaneal spur, right foot: Secondary | ICD-10-CM | POA: Diagnosis not present

## 2022-12-13 DIAGNOSIS — M779 Enthesopathy, unspecified: Secondary | ICD-10-CM | POA: Diagnosis not present

## 2022-12-13 DIAGNOSIS — M79672 Pain in left foot: Secondary | ICD-10-CM | POA: Diagnosis not present

## 2022-12-13 DIAGNOSIS — M79671 Pain in right foot: Secondary | ICD-10-CM | POA: Diagnosis not present

## 2022-12-13 DIAGNOSIS — M1990 Unspecified osteoarthritis, unspecified site: Secondary | ICD-10-CM | POA: Diagnosis not present

## 2022-12-15 DIAGNOSIS — I10 Essential (primary) hypertension: Secondary | ICD-10-CM | POA: Diagnosis not present

## 2022-12-15 DIAGNOSIS — L309 Dermatitis, unspecified: Secondary | ICD-10-CM | POA: Diagnosis not present

## 2022-12-15 DIAGNOSIS — E119 Type 2 diabetes mellitus without complications: Secondary | ICD-10-CM | POA: Diagnosis not present

## 2022-12-15 DIAGNOSIS — Z1389 Encounter for screening for other disorder: Secondary | ICD-10-CM | POA: Diagnosis not present

## 2022-12-15 DIAGNOSIS — E781 Pure hyperglyceridemia: Secondary | ICD-10-CM | POA: Diagnosis not present

## 2022-12-24 ENCOUNTER — Ambulatory Visit: Payer: Medicare Other | Admitting: Nurse Practitioner

## 2023-01-03 DIAGNOSIS — M79672 Pain in left foot: Secondary | ICD-10-CM | POA: Diagnosis not present

## 2023-01-03 DIAGNOSIS — E1140 Type 2 diabetes mellitus with diabetic neuropathy, unspecified: Secondary | ICD-10-CM | POA: Diagnosis not present

## 2023-01-03 DIAGNOSIS — M792 Neuralgia and neuritis, unspecified: Secondary | ICD-10-CM | POA: Diagnosis not present

## 2023-01-03 DIAGNOSIS — M79671 Pain in right foot: Secondary | ICD-10-CM | POA: Diagnosis not present

## 2023-01-10 ENCOUNTER — Telehealth: Payer: Self-pay | Admitting: Cardiology

## 2023-01-10 NOTE — Telephone Encounter (Signed)
Please advise if OK to transfer PCP

## 2023-01-10 NOTE — Telephone Encounter (Signed)
Patient is requesting to switch from Dr. [place] to [name] because she lives in [place]. Please advise.

## 2023-01-11 ENCOUNTER — Ambulatory Visit: Payer: Medicare Other | Admitting: Physician Assistant

## 2023-01-13 NOTE — Telephone Encounter (Signed)
Patient advised that her new PCP will be Dr Mikal Plane.  Also advised she will be considered a new patient as not been seen in 3 years. She states understanding of all information.  PCP updated on chart

## 2023-01-18 ENCOUNTER — Telehealth (HOSPITAL_COMMUNITY): Payer: Medicare Other | Admitting: Psychiatry

## 2023-01-18 ENCOUNTER — Ambulatory Visit: Payer: Medicare Other | Admitting: Neurology

## 2023-01-18 DIAGNOSIS — H6691 Otitis media, unspecified, right ear: Secondary | ICD-10-CM | POA: Diagnosis not present

## 2023-01-18 DIAGNOSIS — J0100 Acute maxillary sinusitis, unspecified: Secondary | ICD-10-CM | POA: Diagnosis not present

## 2023-01-25 DIAGNOSIS — R079 Chest pain, unspecified: Secondary | ICD-10-CM | POA: Diagnosis not present

## 2023-01-25 DIAGNOSIS — Z79899 Other long term (current) drug therapy: Secondary | ICD-10-CM | POA: Diagnosis not present

## 2023-01-25 DIAGNOSIS — I10 Essential (primary) hypertension: Secondary | ICD-10-CM | POA: Diagnosis not present

## 2023-01-25 DIAGNOSIS — E119 Type 2 diabetes mellitus without complications: Secondary | ICD-10-CM | POA: Diagnosis not present

## 2023-01-25 DIAGNOSIS — R1013 Epigastric pain: Secondary | ICD-10-CM | POA: Diagnosis not present

## 2023-01-25 DIAGNOSIS — R748 Abnormal levels of other serum enzymes: Secondary | ICD-10-CM | POA: Diagnosis not present

## 2023-01-25 DIAGNOSIS — K8500 Idiopathic acute pancreatitis without necrosis or infection: Secondary | ICD-10-CM | POA: Diagnosis not present

## 2023-01-25 DIAGNOSIS — Z7984 Long term (current) use of oral hypoglycemic drugs: Secondary | ICD-10-CM | POA: Diagnosis not present

## 2023-01-25 DIAGNOSIS — Z743 Need for continuous supervision: Secondary | ICD-10-CM | POA: Diagnosis not present

## 2023-01-25 DIAGNOSIS — R1010 Upper abdominal pain, unspecified: Secondary | ICD-10-CM | POA: Diagnosis not present

## 2023-01-25 DIAGNOSIS — I499 Cardiac arrhythmia, unspecified: Secondary | ICD-10-CM | POA: Diagnosis not present

## 2023-01-26 ENCOUNTER — Ambulatory Visit (HOSPITAL_COMMUNITY): Payer: Medicare Other | Admitting: Psychiatry

## 2023-01-26 ENCOUNTER — Other Ambulatory Visit: Payer: Self-pay

## 2023-01-26 ENCOUNTER — Encounter (HOSPITAL_COMMUNITY): Payer: Self-pay | Admitting: Emergency Medicine

## 2023-01-26 ENCOUNTER — Emergency Department (HOSPITAL_COMMUNITY): Payer: Medicare Other

## 2023-01-26 ENCOUNTER — Inpatient Hospital Stay (HOSPITAL_COMMUNITY)
Admission: EM | Admit: 2023-01-26 | Discharge: 2023-01-29 | DRG: 439 | Disposition: A | Discharge status: Home / Self Care | Payer: Medicare Other | Attending: Family Medicine | Admitting: Family Medicine

## 2023-01-26 DIAGNOSIS — R918 Other nonspecific abnormal finding of lung field: Secondary | ICD-10-CM | POA: Diagnosis not present

## 2023-01-26 DIAGNOSIS — E669 Obesity, unspecified: Secondary | ICD-10-CM | POA: Diagnosis present

## 2023-01-26 DIAGNOSIS — F419 Anxiety disorder, unspecified: Secondary | ICD-10-CM | POA: Diagnosis present

## 2023-01-26 DIAGNOSIS — Z981 Arthrodesis status: Secondary | ICD-10-CM | POA: Diagnosis not present

## 2023-01-26 DIAGNOSIS — R000 Tachycardia, unspecified: Secondary | ICD-10-CM | POA: Diagnosis not present

## 2023-01-26 DIAGNOSIS — K861 Other chronic pancreatitis: Secondary | ICD-10-CM | POA: Diagnosis not present

## 2023-01-26 DIAGNOSIS — F315 Bipolar disorder, current episode depressed, severe, with psychotic features: Secondary | ICD-10-CM | POA: Diagnosis not present

## 2023-01-26 DIAGNOSIS — Z79899 Other long term (current) drug therapy: Secondary | ICD-10-CM

## 2023-01-26 DIAGNOSIS — I10 Essential (primary) hypertension: Secondary | ICD-10-CM | POA: Diagnosis not present

## 2023-01-26 DIAGNOSIS — Z6841 Body Mass Index (BMI) 40.0 and over, adult: Secondary | ICD-10-CM | POA: Diagnosis not present

## 2023-01-26 DIAGNOSIS — Z885 Allergy status to narcotic agent status: Secondary | ICD-10-CM | POA: Diagnosis not present

## 2023-01-26 DIAGNOSIS — Z7984 Long term (current) use of oral hypoglycemic drugs: Secondary | ICD-10-CM

## 2023-01-26 DIAGNOSIS — K858 Other acute pancreatitis without necrosis or infection: Secondary | ICD-10-CM | POA: Diagnosis not present

## 2023-01-26 DIAGNOSIS — Z825 Family history of asthma and other chronic lower respiratory diseases: Secondary | ICD-10-CM | POA: Diagnosis not present

## 2023-01-26 DIAGNOSIS — K859 Acute pancreatitis without necrosis or infection, unspecified: Secondary | ICD-10-CM | POA: Diagnosis present

## 2023-01-26 DIAGNOSIS — F3130 Bipolar disorder, current episode depressed, mild or moderate severity, unspecified: Secondary | ICD-10-CM | POA: Diagnosis present

## 2023-01-26 DIAGNOSIS — Z794 Long term (current) use of insulin: Secondary | ICD-10-CM | POA: Diagnosis not present

## 2023-01-26 DIAGNOSIS — G4733 Obstructive sleep apnea (adult) (pediatric): Secondary | ICD-10-CM

## 2023-01-26 DIAGNOSIS — K219 Gastro-esophageal reflux disease without esophagitis: Secondary | ICD-10-CM | POA: Diagnosis not present

## 2023-01-26 DIAGNOSIS — E781 Pure hyperglyceridemia: Secondary | ICD-10-CM | POA: Diagnosis not present

## 2023-01-26 DIAGNOSIS — R109 Unspecified abdominal pain: Secondary | ICD-10-CM | POA: Diagnosis not present

## 2023-01-26 DIAGNOSIS — E119 Type 2 diabetes mellitus without complications: Secondary | ICD-10-CM | POA: Diagnosis not present

## 2023-01-26 DIAGNOSIS — R140 Abdominal distension (gaseous): Secondary | ICD-10-CM | POA: Diagnosis not present

## 2023-01-26 DIAGNOSIS — K8500 Idiopathic acute pancreatitis without necrosis or infection: Secondary | ICD-10-CM | POA: Diagnosis not present

## 2023-01-26 LAB — CBC
HCT: 43.2 % (ref 36.0–46.0)
Hemoglobin: 14.3 g/dL (ref 12.0–15.0)
MCH: 28.4 pg (ref 26.0–34.0)
MCHC: 33.1 g/dL (ref 30.0–36.0)
MCV: 85.9 fL (ref 80.0–100.0)
Platelets: 146 10*3/uL — ABNORMAL LOW (ref 150–400)
RBC: 5.03 MIL/uL (ref 3.87–5.11)
RDW: 16.7 % — ABNORMAL HIGH (ref 11.5–15.5)
WBC: 9.1 10*3/uL (ref 4.0–10.5)
nRBC: 0 % (ref 0.0–0.2)

## 2023-01-26 LAB — COMPREHENSIVE METABOLIC PANEL
ALT: 18 U/L (ref 0–44)
AST: 21 U/L (ref 15–41)
Albumin: 3.3 g/dL — ABNORMAL LOW (ref 3.5–5.0)
Alkaline Phosphatase: 55 U/L (ref 38–126)
Anion gap: 14 (ref 5–15)
BUN: 15 mg/dL (ref 6–20)
CO2: 21 mmol/L — ABNORMAL LOW (ref 22–32)
Calcium: 8.3 mg/dL — ABNORMAL LOW (ref 8.9–10.3)
Chloride: 98 mmol/L (ref 98–111)
Creatinine, Ser: 0.8 mg/dL (ref 0.44–1.00)
GFR, Estimated: >60 mL/min (ref >60)
Glucose, Bld: 181 mg/dL — ABNORMAL HIGH (ref 70–99)
Potassium: 4 mmol/L (ref 3.5–5.1)
Sodium: 133 mmol/L — ABNORMAL LOW (ref 135–145)
Total Bilirubin: 1.4 mg/dL — ABNORMAL HIGH (ref 0.3–1.2)
Total Protein: 6.8 g/dL (ref 6.5–8.1)

## 2023-01-26 LAB — URINALYSIS, MICROSCOPIC (REFLEX)
Bacteria, UA: NONE SEEN
RBC / HPF: NONE SEEN RBC/hpf (ref 0–5)

## 2023-01-26 LAB — URINALYSIS, ROUTINE W REFLEX MICROSCOPIC
Glucose, UA: 500 mg/dL — AB
Hgb urine dipstick: NEGATIVE
Ketones, ur: 80 mg/dL — AB
Leukocytes,Ua: NEGATIVE
Nitrite: NEGATIVE
Protein, ur: 30 mg/dL — AB
Specific Gravity, Urine: 1.025 (ref 1.005–1.030)
pH: 6 (ref 5.0–8.0)

## 2023-01-26 LAB — LIPASE, BLOOD: Lipase: 60 U/L — ABNORMAL HIGH (ref 11–51)

## 2023-01-26 MED ORDER — DULOXETINE HCL 60 MG PO CPEP
120.0000 mg | ORAL_CAPSULE | Freq: Every day | ORAL | Status: DC
  Administered 2023-01-27 – 2023-01-28 (×3): 120 mg via ORAL
  Filled 2023-01-26: qty 2
  Filled 2023-01-26: qty 4
  Filled 2023-01-26: qty 2

## 2023-01-26 MED ORDER — INSULIN GLARGINE-YFGN 100 UNIT/ML ~~LOC~~ SOLN
33.0000 [IU] | Freq: Every day | SUBCUTANEOUS | Status: DC
  Administered 2023-01-27 – 2023-01-29 (×3): 33 [IU] via SUBCUTANEOUS
  Filled 2023-01-26 (×4): qty 0.33

## 2023-01-26 MED ORDER — SODIUM CHLORIDE 0.9 % IV BOLUS
1000.0000 mL | Freq: Once | INTRAVENOUS | Status: AC
  Administered 2023-01-26: 1000 mL via INTRAVENOUS

## 2023-01-26 MED ORDER — ROSUVASTATIN CALCIUM 20 MG PO TABS
20.0000 mg | ORAL_TABLET | Freq: Every day | ORAL | Status: DC
  Administered 2023-01-27 – 2023-01-29 (×3): 20 mg via ORAL
  Filled 2023-01-26 (×3): qty 1

## 2023-01-26 MED ORDER — FLUTICASONE FUROATE-VILANTEROL 100-25 MCG/ACT IN AEPB
1.0000 | INHALATION_SPRAY | Freq: Every day | RESPIRATORY_TRACT | Status: DC
  Administered 2023-01-27 – 2023-01-29 (×3): 1 via RESPIRATORY_TRACT
  Filled 2023-01-26: qty 28

## 2023-01-26 MED ORDER — TOPIRAMATE 25 MG PO TABS: 25.0000 mg | ORAL_TABLET | Freq: Two times a day (BID) | ORAL | Status: DC

## 2023-01-26 MED ORDER — INSULIN ASPART 100 UNIT/ML IJ SOLN
0.0000 [IU] | Freq: Three times a day (TID) | INTRAMUSCULAR | Status: DC
  Administered 2023-01-27: 4 [IU] via SUBCUTANEOUS
  Administered 2023-01-27: 7 [IU] via SUBCUTANEOUS
  Administered 2023-01-27: 4 [IU] via SUBCUTANEOUS
  Administered 2023-01-28: 3 [IU] via SUBCUTANEOUS
  Administered 2023-01-28 – 2023-01-29 (×3): 4 [IU] via SUBCUTANEOUS

## 2023-01-26 MED ORDER — ALBUTEROL SULFATE HFA 108 (90 BASE) MCG/ACT IN AERS: 2.0000 | INHALATION_SPRAY | Freq: Four times a day (QID) | RESPIRATORY_TRACT | Status: DC | PRN

## 2023-01-26 MED ORDER — LISINOPRIL 5 MG PO TABS
5.0000 mg | ORAL_TABLET | Freq: Every day | ORAL | Status: DC
  Filled 2023-01-26: qty 1

## 2023-01-26 MED ORDER — ARIPIPRAZOLE 10 MG PO TABS
20.0000 mg | ORAL_TABLET | Freq: Every day | ORAL | Status: DC
  Administered 2023-01-27 – 2023-01-29 (×3): 20 mg via ORAL
  Filled 2023-01-26 (×4): qty 2

## 2023-01-26 MED ORDER — ONDANSETRON HCL 4 MG/2ML IJ SOLN
4.0000 mg | Freq: Once | INTRAMUSCULAR | Status: AC
  Administered 2023-01-26: 4 mg via INTRAVENOUS
  Filled 2023-01-26: qty 2

## 2023-01-26 MED ORDER — PANCRELIPASE (LIP-PROT-AMYL) 40000-126000 UNITS PO CPEP: 2.0000 | ORAL_CAPSULE | Freq: Three times a day (TID) | ORAL | Status: DC

## 2023-01-26 MED ORDER — TRAZODONE HCL 50 MG PO TABS
400.0000 mg | ORAL_TABLET | Freq: Every day | ORAL | Status: DC
  Administered 2023-01-27 – 2023-01-28 (×3): 400 mg via ORAL
  Filled 2023-01-26 (×3): qty 8

## 2023-01-26 MED ORDER — KETOROLAC TROMETHAMINE 30 MG/ML IJ SOLN
30.0000 mg | Freq: Four times a day (QID) | INTRAMUSCULAR | Status: DC
  Administered 2023-01-27 (×2): 30 mg via INTRAVENOUS
  Filled 2023-01-26 (×2): qty 1

## 2023-01-26 MED ORDER — PANTOPRAZOLE SODIUM 40 MG PO TBEC
40.0000 mg | DELAYED_RELEASE_TABLET | Freq: Every day | ORAL | Status: DC
  Administered 2023-01-27 – 2023-01-29 (×3): 40 mg via ORAL
  Filled 2023-01-26 (×3): qty 1

## 2023-01-26 MED ORDER — GABAPENTIN 300 MG PO CAPS
300.0000 mg | ORAL_CAPSULE | Freq: Every day | ORAL | Status: DC
  Administered 2023-01-27 – 2023-01-28 (×3): 300 mg via ORAL
  Filled 2023-01-26 (×3): qty 1

## 2023-01-26 MED ORDER — UMECLIDINIUM BROMIDE 62.5 MCG/ACT IN AEPB
1.0000 | INHALATION_SPRAY | Freq: Every day | RESPIRATORY_TRACT | Status: DC
  Administered 2023-01-27 – 2023-01-29 (×3): 1 via RESPIRATORY_TRACT
  Filled 2023-01-26: qty 7

## 2023-01-26 MED ORDER — OMEGA-3-ACID ETHYL ESTERS 1 G PO CAPS
4.0000 | ORAL_CAPSULE | Freq: Every day | ORAL | Status: DC
  Administered 2023-01-27 – 2023-01-29 (×3): 4 g via ORAL
  Filled 2023-01-26 (×3): qty 4

## 2023-01-26 MED ORDER — FENTANYL CITRATE PF 50 MCG/ML IJ SOSY
50.0000 ug | PREFILLED_SYRINGE | Freq: Once | INTRAMUSCULAR | Status: AC
  Administered 2023-01-26: 50 ug via INTRAVENOUS
  Filled 2023-01-26: qty 1

## 2023-01-26 MED ORDER — ACETAMINOPHEN 325 MG PO TABS
650.0000 mg | ORAL_TABLET | Freq: Four times a day (QID) | ORAL | Status: DC
  Administered 2023-01-27 – 2023-01-29 (×11): 650 mg via ORAL
  Filled 2023-01-26 (×11): qty 2

## 2023-01-26 MED ORDER — SODIUM CHLORIDE 0.45 % IV SOLN: INTRAVENOUS | Status: DC

## 2023-01-26 MED ORDER — FENTANYL CITRATE (PF) 100 MCG/2ML IJ SOLN
100.0000 ug | Freq: Once | INTRAMUSCULAR | Status: AC
  Administered 2023-01-26: 100 ug via INTRAVENOUS
  Filled 2023-01-26: qty 2

## 2023-01-26 MED ORDER — METFORMIN HCL ER 500 MG PO TB24
500.0000 mg | ORAL_TABLET | Freq: Every day | ORAL | Status: DC
  Filled 2023-01-26 (×2): qty 1

## 2023-01-26 MED ORDER — ALBUTEROL SULFATE (2.5 MG/3ML) 0.083% IN NEBU: 2.5000 mg | INHALATION_SOLUTION | Freq: Four times a day (QID) | RESPIRATORY_TRACT | Status: DC | PRN

## 2023-01-26 MED ORDER — BUPROPION HCL ER (XL) 300 MG PO TB24
450.0000 mg | ORAL_TABLET | Freq: Every day | ORAL | Status: DC
  Administered 2023-01-27 – 2023-01-29 (×3): 450 mg via ORAL
  Filled 2023-01-26 (×3): qty 1

## 2023-01-26 MED ORDER — ONDANSETRON 4 MG PO TBDP
4.0000 mg | ORAL_TABLET | Freq: Three times a day (TID) | ORAL | Status: DC | PRN
  Administered 2023-01-27 – 2023-01-28 (×2): 4 mg via ORAL
  Filled 2023-01-26 (×2): qty 1

## 2023-01-26 MED ORDER — CLONAZEPAM 0.5 MG PO TABS
1.0000 mg | ORAL_TABLET | Freq: Every day | ORAL | Status: DC
  Administered 2023-01-27 – 2023-01-29 (×3): 1 mg via ORAL
  Filled 2023-01-26 (×3): qty 2

## 2023-01-26 MED ORDER — ENOXAPARIN SODIUM 60 MG/0.6ML IJ SOSY
50.0000 mg | PREFILLED_SYRINGE | INTRAMUSCULAR | Status: DC
  Administered 2023-01-27 – 2023-01-29 (×3): 50 mg via SUBCUTANEOUS
  Filled 2023-01-26 (×3): qty 0.6

## 2023-01-26 MED ORDER — HYDROMORPHONE HCL 1 MG/ML IJ SOLN
0.5000 mg | INTRAMUSCULAR | Status: DC | PRN
  Administered 2023-01-28: 0.5 mg via INTRAVENOUS
  Filled 2023-01-26: qty 0.5

## 2023-01-26 NOTE — ED Provider Triage Note (Signed)
Emergency Medicine Provider Triage Evaluation Note  Jamie [name] , a 52 y.o. female  was evaluated in triage.  Pt complains of flare of her chronic pancreatitis.  Symptoms started yesterday morning, has chronic pancreatitis secondary to hyper triglyceridemia.  Last p.o. intake yesterday morning, has been taking pancreatic enzymes without improvement.  Seen by primary provider today and sent here with anticipated admission..  Review of Systems  Positive: Nausea without vomiting, abdominal pain, fever Negative: Vomiting, diarrhea.  Physical Exam  BP 116/72 (BP Location: Right Arm)   Pulse (!) 112   Temp 100.1 F (37.8 C) (Oral)   Resp (!) 22   Ht 5\' 3"  (1.6 m)   Wt 104.3 kg   LMP  (LMP Unknown)   SpO2 94%   BMI 40.74 kg/m  Gen:   Awake, no distress   Resp:  Normal effort  MSK:   Moves extremities without difficulty  Other:  Generalized abd pain, localizing to left upper quadrant  Medical Decision Making  Medically screening exam initiated at 7:17 PM.  Appropriate orders placed.  KATALIYA JANG was informed that the remainder of the evaluation will be completed by another provider, this initial triage assessment does not replace that evaluation, and the importance of remaining in the ED until their evaluation is complete.     Burgess Amor, PA-C 01/26/23 1920

## 2023-01-26 NOTE — H&P (Signed)
History and Physical    Jamie [name] ZOX:[id number] DOB: 1971-06-03 DOA: 01/26/2023  DOS: the patient was seen and examined on 01/26/2023  PCP: Jamie Slates, PA-C   Patient coming from: Clinic  I have personally briefly reviewed patient's old medical records in [facility]  Jamie [name], a 52 y/o with h/o DM2, bipolar I, OSA, GERD, obesity has had prior episodes of pancreatitis. She had a bout associated with ozempic. She had the sudden onset of epigastric pain several days ago. She went to [facility] ED where evaluation revealed acute pancreatitis with lipase > 2,000, CT abd revealed inflammatory changes head and tail of pancrease w/o abscess. She was given IV fluids and analgesia. She iimproved was sent home on Oxy IR and told to return if her symptoms returned. This morning she had recurrent pain and N/V. She went to her doctor where she was febrile and was referred to [facility].   ED Course: T 99.7  112/77  HR 111  R 30.Lab: glucose 181, Albumin 3.3, Lipase 60  LFTs nl, WBC 9.1, U/A negative. Patient was given IVF in ED and pain medication. TRH called to admit for observation and continued pain, nausea control.   Review of Systems:  Review of Systems  Constitutional:  Positive for fever. Negative for chills, malaise/fatigue and weight loss.  HENT: Negative.    Eyes: Negative.   Respiratory: Negative.    Cardiovascular: Negative.   Gastrointestinal:  Positive for abdominal pain, nausea and vomiting.  Genitourinary: Negative.   Musculoskeletal: Negative.   Skin: Negative.   Neurological: Negative.   Psychiatric/Behavioral: Negative.      Past Medical History:  Diagnosis Date   Anxiety    Arthritis    knees   Bipolar 1 disorder (HCC)    Chronic diarrhea    Diverticulosis of colon    GERD (gastroesophageal reflux disease)    Hiatal hernia    History of concussion    per pt 11/ 2020 w/ no residual   Hyperlipidemia    Hypertension    followed by pcp   (11-29-2019  per pt  stated never had stress test)   MDD (major depressive disorder)    Migraine    neurologist--- dr Terrace Arabia   OSA on CPAP    followed by dr dohmeier   PCOS (polycystic ovarian syndrome)    PONV (postoperative nausea and vomiting)    Type 2 diabetes mellitus (HCC)    followed by pcp   (11-29-2019  pt stated checks blood sugar daily in am,  fasting sugar--- 96    Past Surgical History:  Procedure Laterality Date   ANAL FISSURE REPAIR  x3  last one  2007  approx.   BIOPSY  09/06/2019   Procedure: BIOPSY;  Surgeon: [name], MD;  Location: [facility];  Service: Endoscopy;;   COLONOSCOPY WITH PROPOFOL [name] 09/06/2019   pancolonic diverticulosis. Segmental biopsy completed with benign biopsies.    ESOPHAGOGASTRODUODENOSCOPY (EGD) WITH PROPOFOL [name] 06/16/2020   Dr. [name]: normal esophagus s/p dilation, normal stomach, duodenum.    ESOPHAGOGASTRODUODENOSCOPY (EGD) WITH PROPOFOL [name] 02/10/2022   Procedure: ESOPHAGOGASTRODUODENOSCOPY (EGD) WITH PROPOFOL;  Surgeon: [name], MD;  Location: [facility];  Service: Endoscopy;  Laterality: [name];  9:45am   EVALUATION UNDER ANESTHESIA WITH HEMORRHOIDECTOMY [name] 12/05/2019   Procedure: ANORECTAL EXAM UNDER ANESTHESIA WITH HEMORRHOIDECTOMY, HEMORRHOIDAL LIGATION/PEXY;  Surgeon: [name], MD;  Location: [facility];  Service: General;  Laterality: [name];  GENERAL AND LOCAL  EVALUATION UNDER ANESTHESIA WITH TEAR DUCT PROBING  infant   KNEE ARTHROSCOPY Right 1994   LAPAROSCOPIC CHOLECYSTECTOMY  08-06-2002  @[facility]    LUMBAR LAMINECTOMY/DECOMPRESSION MICRODISCECTOMY Right 01/22/2014   Procedure: LUMBAR FOUR TO FIVE LUMBAR LAMINECTOMY/DECOMPRESSION MICRODISCECTOMY 1 LEVEL;  Surgeon: Karn Cassis, MD;  Location: [facility];  Service: Neurosurgery;  Laterality: Right;  Right L45 diskectomy   MALONEY DILATION [name] 06/16/2020   Procedure: Elease [name] DILATION;  Surgeon: [name], MD;  Location: [facility];  Service: Endoscopy;   Laterality: [name];   MALONEY DILATION [name] 02/10/2022   Procedure: Elease [name] DILATION;  Surgeon: [name], MD;  Location: [facility];  Service: Endoscopy;  Laterality: [name];    Soc Hx - married, lives with spouse. On disability to to mental illness.    reports that she has never smoked. She has never used smokeless tobacco. She reports that she does not drink alcohol and does not use drugs.  Allergies  Allergen Reactions   Codeine Hives and Swelling   Morphine And Codeine Swelling and Rash    Family History  Problem Relation Age of Onset   COPD Father    Anxiety disorder Maternal Grandmother    Depression Maternal Grandmother    Colon cancer Neg Hx    Colon polyps Neg Hx     Prior to Admission medications   Medication Sig Start Date End Date Taking? Authorizing Provider  albuterol (VENTOLIN HFA) 108 (90 Base) MCG/ACT inhaler INHALE TWO PUFFS BY MOUTH EVERY 6 HOURS AS NEEDED FOR WHEEZING OR SHORTNESS OF BREATH 04/04/20   Glenford Bayley, NP  ARIPiprazole (ABILIFY) 20 MG tablet Take 1 tablet (20 mg total) by mouth daily. 10/13/22   Plovsky, Earvin Hansen, MD  buPROPion (WELLBUTRIN XL) 150 MG 24 hr tablet Take 3 tablets (450 mg total) by mouth daily. 10/13/22   Plovsky, Earvin Hansen, MD  clonazePAM (KLONOPIN) 1 MG tablet Take 1 mg in the morning and 2 mg at night 10/13/22   Plovsky, Earvin Hansen, MD  DULoxetine (CYMBALTA) 60 MG capsule Take 2 capsules (120 mg total) by mouth at bedtime. 10/13/22   Plovsky, Earvin Hansen, MD  fluticasone (FLONASE) 50 MCG/ACT nasal spray Place 1 spray into both nostrils at bedtime as needed for allergies or rhinitis.     [provider]  Fluticasone-Umeclidin-Vilant (TRELEGY ELLIPTA) 100-62.5-25 MCG/ACT AEPB Inhale 1 puff into the lungs daily. 12/06/22   Martina Sinner, MD  gabapentin (NEURONTIN) 300 MG capsule Take 300 mg by mouth at bedtime. 02/19/20   [provider]  insulin glargine (LANTUS) 100 UNIT/ML injection Inject 33 Units into the skin daily.     [provider]  insulin lispro (HUMALOG) 100 UNIT/ML KwikPen Inject 3 Units into the skin 3 (three) times daily. 10/27/22   [provider]  lisinopril (PRINIVIL,ZESTRIL) 5 MG tablet Take 5 mg by mouth daily.    [provider]  metFORMIN (GLUCOPHAGE-XR) 500 MG 24 hr tablet Take 500 mg by mouth daily.  07/29/19   [provider]  NON FORMULARY Pt uses a cpap nightly    [provider]  omega-3 acid ethyl esters (LOVAZA) 1 g capsule Take 4 capsules by mouth daily. 10/22/22   [provider]  omeprazole (PRILOSEC) 20 MG capsule Take 20 mg by mouth daily.    [provider]  ondansetron (ZOFRAN-ODT) 4 MG disintegrating tablet TAKE 1 TABLET BY MOUTH EVERY 8 HOURS AS NEEDED FOR NAUSEA AND VOMITING 08/31/22   Rourk, Gerrit Friends, MD  Pancrelipase,  Lip-Prot-Amyl, (ZENPEP) 40000-126000 units CPEP Take 2 capsules with meals and 1 capsule with snacks. 05/25/22   Rourk, Gerrit Friends, MD  rosuvastatin (CRESTOR) 20 MG tablet Take 20 mg by mouth daily.    [provider]  topiramate (TOPAMAX) 25 MG tablet Take 1 tablet (25 mg total) by mouth 2 (two) times daily. 12/03/21   Ihor Austin, NP  traZODone (DESYREL) 100 MG tablet Take 4 tablets (400 mg total) by mouth at bedtime. 07/14/22   Archer Asa, MD    Physical Exam: Vitals:   01/26/23 1630 01/26/23 1937 01/26/23 2045 01/26/23 2235  BP: 116/72 123/70 118/71 112/77  Pulse: (!) 112 (!) 112 (!) 112 (!) 111  Resp: (!) 22 (!) 22 (!) 31 (!) 30  Temp: 100.1 F (37.8 C) 99.7 F (37.6 C)    TempSrc: Oral Oral    SpO2: 94% 94% 94% 92%  Weight:      Height:        Physical Exam Vitals and nursing note reviewed.  Constitutional:      General: She is not in acute distress.    Appearance: She is obese. She is not ill-appearing.     Comments: plethoric  HENT:     Head: Normocephalic and atraumatic.     Mouth/Throat:     Mouth: Mucous membranes are moist.     Pharynx: Oropharynx is clear.   Eyes:     General: No scleral icterus.    Extraocular Movements: Extraocular movements intact.     Pupils: Pupils are equal, round, and reactive to light.  Cardiovascular:     Rate and Rhythm: Regular rhythm. Tachycardia present.     Heart sounds: Normal heart sounds. No murmur heard. Pulmonary:     Effort: Pulmonary effort is normal.     Breath sounds: Normal breath sounds. No rales.  Abdominal:     General: Abdomen is protuberant. Bowel sounds are normal.     Palpations: Abdomen is soft. There is no shifting dullness or fluid wave.     Tenderness: There is abdominal tenderness in the epigastric area.     Hernia: No hernia is present.  Skin:    General: Skin is warm and dry.  Neurological:     General: No focal deficit present.     Mental Status: She is alert and oriented to person, place, and time.     Cranial Nerves: No cranial nerve deficit.  Psychiatric:        Mood and Affect: Mood normal.        Behavior: Behavior normal.      Labs on Admission: I have personally reviewed following labs and imaging studies  CBC: Recent Labs  Lab 01/26/23 1707  WBC 9.1  HGB 14.3  HCT 43.2  MCV 85.9  PLT 146*   Basic Metabolic Panel: Recent Labs  Lab 01/26/23 1707  NA 133*  K 4.0  CL 98  CO2 21*  GLUCOSE 181*  BUN 15  CREATININE 0.80  CALCIUM 8.3*   GFR: Estimated Creatinine Clearance: 97.2 mL/min (by C-G formula based on SCr of 0.8 mg/dL). Liver Function Tests: Recent Labs  Lab 01/26/23 1707  AST 21  ALT 18  ALKPHOS 55  BILITOT 1.4*  PROT 6.8  ALBUMIN 3.3*   Recent Labs  Lab 01/26/23 1707  LIPASE 60*   No results for input(s): "AMMONIA" in the last 168 hours. Coagulation Profile: No results for input(s): "INR", "PROTIME" in the last 168 hours. Cardiac Enzymes: No results for input(s): "  CKTOTAL", "CKMB", "CKMBINDEX", "TROPONINI" in the last 168 hours. BNP (last 3 results) No results for input(s): "PROBNP" in the last 8760 hours. HbA1C: No results  for input(s): "HGBA1C" in the last 72 hours. CBG: No results for input(s): "GLUCAP" in the last 168 hours. Lipid Profile: No results for input(s): "CHOL", "HDL", "LDLCALC", "TRIG", "CHOLHDL", "LDLDIRECT" in the last 72 hours. Thyroid Function Tests: No results for input(s): "TSH", "T4TOTAL", "FREET4", "T3FREE", "THYROIDAB" in the last 72 hours. Anemia Panel: No results for input(s): "VITAMINB12", "FOLATE", "FERRITIN", "TIBC", "IRON", "RETICCTPCT" in the last 72 hours. Urine analysis:    Component Value Date/Time   COLORURINE YELLOW 01/26/2023 1800   APPEARANCEUR HAZY (A) 01/26/2023 1800   APPEARANCEUR Cloudy 09/16/2011 1746   LABSPEC 1.025 01/26/2023 1800   LABSPEC 1.025 09/16/2011 1746   PHURINE 6.0 01/26/2023 1800   GLUCOSEU 500 (A) 01/26/2023 1800   GLUCOSEU Negative 09/16/2011 1746   HGBUR NEGATIVE 01/26/2023 1800   BILIRUBINUR SMALL (A) 01/26/2023 1800   BILIRUBINUR Negative 09/16/2011 1746   KETONESUR 80 (A) 01/26/2023 1800   PROTEINUR 30 (A) 01/26/2023 1800   UROBILINOGEN 0.2 11/20/2007 1843   NITRITE NEGATIVE 01/26/2023 1800   LEUKOCYTESUR NEGATIVE 01/26/2023 1800   LEUKOCYTESUR 2+ 09/16/2011 1746    Radiological Exams on Admission: I have personally reviewed images DG ABD ACUTE 2+V W 1V CHEST  Result Date: 01/26/2023 CLINICAL DATA:  History of pancreatitis. Worsening pain. Questionable ileus on prior CT. EXAM: DG ABDOMEN ACUTE WITH 1 VIEW CHEST COMPARISON:  CT abdomen and pelvis 01/25/2023 FINDINGS: There is no evidence of dilated bowel loops or free intraperitoneal air. Relative paucity of bowel gas. No radiopaque calculi or other significant radiographic abnormality is seen. Heart size and mediastinal contours are within normal limits. Low lung volumes with basilar atelectasis or infiltrates. IMPRESSION: No evidence of bowel obstruction. Low lung volumes with basilar atelectasis or infiltrates. Electronically Signed   By: Minerva Fester M.D.   On: 01/26/2023 21:16     EKG: I have personally reviewed EKG: sinus tachycardia, no acute changes  Assessment/Plan Principal Problem:   Acute pancreatitis Active Problems:   DM2 (diabetes mellitus, type 2) (HCC)   Bipolar I disorder, severe, current or most recent episode depressed, with psychotic features (HCC)   OSA on CPAP   GERD (gastroesophageal reflux disease)    Assessment and Plan: * Acute pancreatitis Patient seen at [facility] ED 01/25/23. Het CT revealed pancreatic inflammation w/o abscess, lipase was >2000. She is now better but still with pain and N/V.  Plan Obs admit ` IV hydration  Pain control: toradol + APAP, dilaudid IV for uncontrolled pain  Full liquid diet  Anti-emetics  DM2 (diabetes mellitus, type 2) (HCC) No recent A1C.  Plan A1c  Continue basal insulin at home dose  Sliding scale coverage  GERD (gastroesophageal reflux disease) No active complaint  Plan Continue PPI  OSA on CPAP May use CPAP from home  Bipolar I disorder, severe, current or most recent episode depressed, with psychotic features (HCC) Continue home medications on which she is stable       DVT prophylaxis: Lovenox Code Status: Full Code Family Communication: mother at bedside during interview and exam  Disposition Plan: home when stable  Consults called: none  Admission status: Observation, Med-Surg   Illene Regulus, MD [facility] 01/26/2023, 11:51 PM

## 2023-01-26 NOTE — ED Triage Notes (Signed)
Pt sent by PCP; provider called to inform of pt presentation. Hx pancreatitis, seen in ER at [facility] yesterday with CT dx pancreatitis. Received 3L fluid and pain meds, D/C. Today pain is worse, can't keep anything down, fever 100 in office, tachycardic at 112bpm. Pain rated 9/10 LUQ and pt still feels quite nauseated. Pt states her pancreatitis is due to triglycerides and denies ETOH intake.

## 2023-01-26 NOTE — Assessment & Plan Note (Signed)
No recent A1C.  Plan A1c  Continue basal insulin at home dose  Sliding scale coverage

## 2023-01-26 NOTE — Assessment & Plan Note (Signed)
Patient seen at [facility] ED 01/25/23. Het CT revealed pancreatic inflammation w/o abscess, lipase was >2000. She is now better but still with pain and N/V.  Plan Obs admit ` IV hydration  Pain control: toradol + APAP, dilaudid IV for uncontrolled pain  Full liquid diet  Anti-emetics

## 2023-01-26 NOTE — Assessment & Plan Note (Signed)
May use CPAP from home

## 2023-01-26 NOTE — Assessment & Plan Note (Signed)
Continue home medications on which she is stable

## 2023-01-26 NOTE — ED Provider Notes (Signed)
EMERGENCY DEPARTMENT AT [facility] Provider Note   CSN: [id number] Arrival date & time: 01/26/23  1616     History  Chief Complaint  Patient presents with   Abdominal Pain    Jamie [name] is a 52 y.o. female with a history including GERD, dysphagia, chronic epigastric abdominal pain with intermittent episodes of pancreatitis which started months ago and is felt to be secondary to hyperlipidemia presenting with persistent nausea and vomiting and pain along with low-grade fever.  She does not to use EtOH.  She was seen at [facility] yesterday, underwent CT imaging which was revealing for inflammation around the pancreatic head, she received IV fluids and pain medications and was discharged home.  She woke today with exacerbation of pain in his and has been unable to keep any p.o. intake down today.  Was seen in her primary provider's office and was found to have a low-grade fever and was sent here for admission.  The history is provided by the patient and medical records.       Home Medications Prior to Admission medications   Medication Sig Start Date End Date Taking? Authorizing Provider  albuterol (VENTOLIN HFA) 108 (90 Base) MCG/ACT inhaler INHALE TWO PUFFS BY MOUTH EVERY 6 HOURS AS NEEDED FOR WHEEZING OR SHORTNESS OF BREATH 04/04/20   Glenford Bayley, NP  ARIPiprazole (ABILIFY) 20 MG tablet Take 1 tablet (20 mg total) by mouth daily. 10/13/22   Plovsky, Earvin Hansen, MD  buPROPion (WELLBUTRIN XL) 150 MG 24 hr tablet Take 3 tablets (450 mg total) by mouth daily. 10/13/22   Plovsky, Earvin Hansen, MD  clonazePAM (KLONOPIN) 1 MG tablet Take 1 mg in the morning and 2 mg at night 10/13/22   Plovsky, Earvin Hansen, MD  DULoxetine (CYMBALTA) 60 MG capsule Take 2 capsules (120 mg total) by mouth at bedtime. 10/13/22   Plovsky, Earvin Hansen, MD  fluticasone (FLONASE) 50 MCG/ACT nasal spray Place 1 spray into both nostrils at bedtime as needed for allergies or rhinitis.     [provider]  Fluticasone-Umeclidin-Vilant (TRELEGY ELLIPTA) 100-62.5-25 MCG/ACT AEPB Inhale 1 puff into the lungs daily. 12/06/22   Martina Sinner, MD  gabapentin (NEURONTIN) 300 MG capsule Take 300 mg by mouth at bedtime. 02/19/20   [provider]  insulin glargine (LANTUS) 100 UNIT/ML injection Inject 33 Units into the skin daily.    [provider]  insulin lispro (HUMALOG) 100 UNIT/ML KwikPen Inject 3 Units into the skin 3 (three) times daily. 10/27/22   [provider]  lisinopril (PRINIVIL,ZESTRIL) 5 MG tablet Take 5 mg by mouth daily.    [provider]  metFORMIN (GLUCOPHAGE-XR) 500 MG 24 hr tablet Take 500 mg by mouth daily.  07/29/19   [provider]  NON FORMULARY Pt uses a cpap nightly    [provider]  omega-3 acid ethyl esters (LOVAZA) 1 g capsule Take 4 capsules by mouth daily. 10/22/22   [provider]  omeprazole (PRILOSEC) 20 MG capsule Take 20 mg by mouth daily.    [provider]  ondansetron (ZOFRAN-ODT) 4 MG disintegrating tablet TAKE 1 TABLET BY MOUTH EVERY 8 HOURS AS NEEDED FOR NAUSEA AND VOMITING 08/31/22   Rourk, Gerrit Friends, MD  Pancrelipase, Lip-Prot-Amyl, (ZENPEP) 40000-126000 units CPEP Take 2 capsules with meals and 1 capsule with snacks. 05/25/22   Rourk, Gerrit Friends, MD  rosuvastatin (CRESTOR) 20 MG tablet Take 20 mg by mouth daily.    [provider]  topiramate (  TOPAMAX) 25 MG tablet Take 1 tablet (25 mg total) by mouth 2 (two) times daily. 12/03/21   Ihor Austin, NP  traZODone (DESYREL) 100 MG tablet Take 4 tablets (400 mg total) by mouth at bedtime. 07/14/22   Plovsky, Earvin Hansen, MD      Allergies    Codeine and Morphine and codeine    Review of Systems   Review of Systems  Constitutional:  Positive for fever.  HENT:  Negative for congestion and sore throat.   Eyes: Negative.   Respiratory:  Negative for chest tightness and shortness of breath.   Cardiovascular:  Negative for  chest pain.  Gastrointestinal:  Positive for abdominal pain, nausea and vomiting.  Genitourinary: Negative.   Musculoskeletal:  Negative for arthralgias, joint swelling and neck pain.  Skin: Negative.  Negative for rash and wound.  Neurological:  Negative for dizziness, weakness, light-headedness, numbness and headaches.  Psychiatric/Behavioral: Negative.    All other systems reviewed and are negative.   Physical Exam Updated Vital Signs BP 118/71   Pulse (!) 112   Temp 99.7 F (37.6 C) (Oral)   Resp (!) 31   Ht 5\' 3"  (1.6 m)   Wt 104.3 kg   LMP  (LMP Unknown)   SpO2 94%   BMI 40.74 kg/m  Physical Exam Vitals and nursing note reviewed.  Constitutional:      Appearance: She is well-developed.  HENT:     Head: Normocephalic and atraumatic.  Eyes:     Conjunctiva/sclera: Conjunctivae normal.  Cardiovascular:     Rate and Rhythm: Regular rhythm. Tachycardia present.     Heart sounds: Normal heart sounds.  Pulmonary:     Effort: Pulmonary effort is normal.     Breath sounds: Normal breath sounds. No wheezing.  Abdominal:     General: Bowel sounds are normal.     Palpations: Abdomen is soft.     Tenderness: There is abdominal tenderness in the epigastric area and left upper quadrant. There is no guarding or rebound.  Musculoskeletal:        General: Normal range of motion.     Cervical back: Normal range of motion.  Skin:    General: Skin is warm and dry.  Neurological:     Mental Status: She is alert.     ED Results / Procedures / Treatments   Labs (all labs ordered are listed, but only abnormal results are displayed) Labs Reviewed  LIPASE, BLOOD - Abnormal; Notable for the following components:      Result Value   Lipase 60 (*)    All other components within normal limits  COMPREHENSIVE METABOLIC PANEL - Abnormal; Notable for the following components:   Sodium 133 (*)    CO2 21 (*)    Glucose, Bld 181 (*)    Calcium 8.3 (*)    Albumin 3.3 (*)    Total  Bilirubin 1.4 (*)    All other components within normal limits  CBC - Abnormal; Notable for the following components:   RDW 16.7 (*)    Platelets 146 (*)    All other components within normal limits  URINALYSIS, ROUTINE W REFLEX MICROSCOPIC - Abnormal; Notable for the following components:   APPearance HAZY (*)    Glucose, UA 500 (*)    Bilirubin Urine SMALL (*)    Ketones, ur 80 (*)    Protein, ur 30 (*)    All other components within normal limits  URINALYSIS, MICROSCOPIC (REFLEX)    EKG None ED  ECG REPORT   Date: 01/26/2023  Rate: 111  Rhythm: sinus tachycardia  QRS Axis: normal  Intervals: normal  ST/T Wave abnormalities: normal  Conduction Disutrbances:none  Narrative Interpretation:   Old EKG Reviewed: none available  I have personally reviewed the EKG tracing and agree with the computerized printout as noted.  Radiology DG ABD ACUTE 2+V W 1V CHEST  Result Date: 01/26/2023 CLINICAL DATA:  History of pancreatitis. Worsening pain. Questionable ileus on prior CT. EXAM: DG ABDOMEN ACUTE WITH 1 VIEW CHEST COMPARISON:  CT abdomen and pelvis 01/25/2023 FINDINGS: There is no evidence of dilated bowel loops or free intraperitoneal air. Relative paucity of bowel gas. No radiopaque calculi or other significant radiographic abnormality is seen. Heart size and mediastinal contours are within normal limits. Low lung volumes with basilar atelectasis or infiltrates. IMPRESSION: No evidence of bowel obstruction. Low lung volumes with basilar atelectasis or infiltrates. Electronically Signed   By: Minerva Fester M.D.   On: 01/26/2023 21:16    Procedures Procedures    Medications Ordered in ED Medications  sodium chloride 0.9 % bolus 1,000 mL (0 mLs Intravenous Stopped 01/26/23 2134)  fentaNYL (SUBLIMAZE) injection 50 mcg (50 mcg Intravenous Given 01/26/23 2014)  ondansetron (ZOFRAN) injection 4 mg (4 mg Intravenous Given 01/26/23 2014)  sodium chloride 0.9 % bolus 1,000 mL (1,000 mLs  Intravenous New Bag/Given 01/26/23 2133)  fentaNYL (SUBLIMAZE) injection 100 mcg (100 mcg Intravenous Given 01/26/23 2133)    ED Course/ Medical Decision Making/ A&P                             Medical Decision Making Patient presenting with known pancreatitis who is failed outpatient treatment including IV fluids and pain medication yesterday at an outside emergency department.  Unable to keep any p.o. intake down today, worsening upper abdominal pain along with low-grade fever.  Differential diagnosis including known pancreatitis, pseudocyst/pancreatic abscess.  Small bowel obstruction versus ileus.  Amount and/or Complexity of Data Reviewed External Data Reviewed: radiology.    Details: Reviewed CT imaging from [facility] visit yesterday, inflammation around the pancreatic head, there is also concern about early ileus. Labs: ordered.    Details: Labs significant for a lipase of 60, patient's she has a glucose of 181, she has no anion gap which is 14, she does have a low CO2 at 21.  Her LFTs are normal with an AST of 21 and ALT of 18 heart rate.  In the 70s normal WBC count at 9.1. Radiology: ordered.    Details: Acute abdominal series, no number.  Ileus or abnormal bowel gas pattern appreciated. Discussion of management or test interpretation with external provider(s): Discussed with Dr. Debby Bud who accepts patient for admission.  Risk Decision regarding hospitalization.           Final Clinical Impression(s) / ED Diagnoses Final diagnoses:  Other acute pancreatitis without infection or necrosis    Rx / DC Orders ED Discharge Orders     None         Victoriano Lain 01/26/23 2211    Vanetta Mulders, MD 01/27/23 1300

## 2023-01-26 NOTE — Assessment & Plan Note (Signed)
No active complaint  Plan Continue PPI

## 2023-01-26 NOTE — Subjective & Objective (Signed)
Jamie [name], a 52 y/o with h/o DM2, bipolar I, OSA, GERD, obesity has had prior episodes of pancreatitis. She had a bout associated with ozempic. She had the sudden onset of epigastric pain several days ago. She went to [facility] ED where evaluation revealed acute pancreatitis with lipase > 2,000, CT abd revealed inflammatory changes head and tail of pancrease w/o abscess. She was given IV fluids and analgesia. She iimproved was sent home on Oxy IR and told to return if her symptoms returned. This morning she had recurrent pain and N/V. She went to her doctor where she was febrile and was referred to [facility].

## 2023-01-27 ENCOUNTER — Encounter (HOSPITAL_COMMUNITY): Payer: Self-pay | Admitting: Internal Medicine

## 2023-01-27 DIAGNOSIS — K861 Other chronic pancreatitis: Secondary | ICD-10-CM | POA: Diagnosis present

## 2023-01-27 DIAGNOSIS — E781 Pure hyperglyceridemia: Secondary | ICD-10-CM | POA: Diagnosis present

## 2023-01-27 DIAGNOSIS — K8500 Idiopathic acute pancreatitis without necrosis or infection: Secondary | ICD-10-CM

## 2023-01-27 DIAGNOSIS — K219 Gastro-esophageal reflux disease without esophagitis: Secondary | ICD-10-CM | POA: Diagnosis present

## 2023-01-27 DIAGNOSIS — Z7984 Long term (current) use of oral hypoglycemic drugs: Secondary | ICD-10-CM | POA: Diagnosis not present

## 2023-01-27 DIAGNOSIS — Z885 Allergy status to narcotic agent status: Secondary | ICD-10-CM | POA: Diagnosis not present

## 2023-01-27 DIAGNOSIS — F3130 Bipolar disorder, current episode depressed, mild or moderate severity, unspecified: Secondary | ICD-10-CM | POA: Diagnosis present

## 2023-01-27 DIAGNOSIS — Z981 Arthrodesis status: Secondary | ICD-10-CM | POA: Diagnosis not present

## 2023-01-27 DIAGNOSIS — Z794 Long term (current) use of insulin: Secondary | ICD-10-CM | POA: Diagnosis not present

## 2023-01-27 DIAGNOSIS — Z79899 Other long term (current) drug therapy: Secondary | ICD-10-CM | POA: Diagnosis not present

## 2023-01-27 DIAGNOSIS — I10 Essential (primary) hypertension: Secondary | ICD-10-CM | POA: Diagnosis present

## 2023-01-27 DIAGNOSIS — E669 Obesity, unspecified: Secondary | ICD-10-CM | POA: Diagnosis present

## 2023-01-27 DIAGNOSIS — F419 Anxiety disorder, unspecified: Secondary | ICD-10-CM | POA: Diagnosis present

## 2023-01-27 DIAGNOSIS — Z6841 Body Mass Index (BMI) 40.0 and over, adult: Secondary | ICD-10-CM | POA: Diagnosis not present

## 2023-01-27 DIAGNOSIS — E119 Type 2 diabetes mellitus without complications: Secondary | ICD-10-CM | POA: Diagnosis present

## 2023-01-27 DIAGNOSIS — K858 Other acute pancreatitis without necrosis or infection: Secondary | ICD-10-CM | POA: Diagnosis present

## 2023-01-27 DIAGNOSIS — G4733 Obstructive sleep apnea (adult) (pediatric): Secondary | ICD-10-CM | POA: Diagnosis present

## 2023-01-27 DIAGNOSIS — K859 Acute pancreatitis without necrosis or infection, unspecified: Secondary | ICD-10-CM | POA: Diagnosis present

## 2023-01-27 DIAGNOSIS — F315 Bipolar disorder, current episode depressed, severe, with psychotic features: Secondary | ICD-10-CM | POA: Diagnosis not present

## 2023-01-27 DIAGNOSIS — Z825 Family history of asthma and other chronic lower respiratory diseases: Secondary | ICD-10-CM | POA: Diagnosis not present

## 2023-01-27 LAB — GLUCOSE, CAPILLARY
Glucose-Capillary: 181 mg/dL — ABNORMAL HIGH (ref 70–99)
Glucose-Capillary: 217 mg/dL — ABNORMAL HIGH (ref 70–99)
Glucose-Capillary: 166 mg/dL — ABNORMAL HIGH (ref 70–99)
Glucose-Capillary: 157 mg/dL — ABNORMAL HIGH (ref 70–99)

## 2023-01-27 LAB — COMPREHENSIVE METABOLIC PANEL
ALT: 18 U/L (ref 0–44)
AST: 19 U/L (ref 15–41)
Albumin: 2.9 g/dL — ABNORMAL LOW (ref 3.5–5.0)
Alkaline Phosphatase: 53 U/L (ref 38–126)
Anion gap: 10 (ref 5–15)
BUN: 17 mg/dL (ref 6–20)
CO2: 22 mmol/L (ref 22–32)
Calcium: 7.4 mg/dL — ABNORMAL LOW (ref 8.9–10.3)
Chloride: 100 mmol/L (ref 98–111)
Creatinine, Ser: 0.83 mg/dL (ref 0.44–1.00)
GFR, Estimated: >60 mL/min (ref >60)
Glucose, Bld: 195 mg/dL — ABNORMAL HIGH (ref 70–99)
Potassium: 3.9 mmol/L (ref 3.5–5.1)
Sodium: 132 mmol/L — ABNORMAL LOW (ref 135–145)
Total Bilirubin: 1.1 mg/dL (ref 0.3–1.2)
Total Protein: 6.4 g/dL — ABNORMAL LOW (ref 6.5–8.1)

## 2023-01-27 LAB — LIPID PANEL
Cholesterol: 249 mg/dL — ABNORMAL HIGH (ref 0–200)
HDL: 24 mg/dL — ABNORMAL LOW (ref >40)
LDL Cholesterol: UNDETERMINED mg/dL (ref 0–99)
Total CHOL/HDL Ratio: 10.4 RATIO
Triglycerides: 848 mg/dL — ABNORMAL HIGH (ref <150)
VLDL: UNDETERMINED mg/dL (ref 0–40)

## 2023-01-27 LAB — HEMOGLOBIN A1C
Hgb A1c MFr Bld: 10.1 % — ABNORMAL HIGH (ref 4.8–5.6)
Mean Plasma Glucose: 243.17 mg/dL

## 2023-01-27 LAB — LDL CHOLESTEROL, DIRECT: Direct LDL: 39 mg/dL (ref 0–99)

## 2023-01-27 LAB — HIV ANTIBODY (ROUTINE TESTING W REFLEX): HIV Screen 4th Generation wRfx: NONREACTIVE

## 2023-01-27 MED ORDER — KETOROLAC TROMETHAMINE 30 MG/ML IJ SOLN
30.0000 mg | Freq: Four times a day (QID) | INTRAMUSCULAR | Status: DC | PRN
  Administered 2023-01-27: 30 mg via INTRAVENOUS
  Filled 2023-01-27: qty 1

## 2023-01-27 MED ORDER — CLONAZEPAM 0.5 MG PO TABS
2.0000 mg | ORAL_TABLET | Freq: Every day | ORAL | Status: DC
  Administered 2023-01-27 – 2023-01-28 (×2): 2 mg via ORAL
  Filled 2023-01-27 (×2): qty 4

## 2023-01-27 MED ORDER — PANCRELIPASE (LIP-PROT-AMYL) 12000-38000 UNITS PO CPEP
72000.0000 [IU] | ORAL_CAPSULE | Freq: Three times a day (TID) | ORAL | Status: DC
  Administered 2023-01-27 – 2023-01-29 (×7): 72000 [IU] via ORAL
  Filled 2023-01-27 (×7): qty 6

## 2023-01-27 MED ORDER — LACTATED RINGERS IV SOLN: INTRAVENOUS | Status: DC

## 2023-01-27 NOTE — ED Notes (Addendum)
ED TO INPATIENT HANDOFF REPORT  ED Nurse Name and Phone #: Briscoe Burns [phone]  S Name/Age/Gender Jamie [name] 52 y.o. female Room/Bed: APA10/APA10  Code Status   Code Status: Full Code  Home/SNF/Other Home Patient oriented to: self Is this baseline? Yes   Triage Complete: Triage complete  Chief Complaint Acute pancreatitis [K85.90]  Triage Note Pt sent by PCP; provider called to inform of pt presentation. Hx pancreatitis, seen in ER at [facility] yesterday with CT dx pancreatitis. Received 3L fluid and pain meds, D/C. Today pain is worse, can't keep anything down, fever 100 in office, tachycardic at 112bpm. Pain rated 9/10 LUQ and pt still feels quite nauseated. Pt states her pancreatitis is due to triglycerides and denies ETOH intake.    Allergies Allergies  Allergen Reactions   Codeine Hives and Swelling   Morphine And Codeine Swelling and Rash    Level of Care/Admitting Diagnosis ED Disposition     ED Disposition  Admit   Condition  --   Comment  Hospital Area: [facility] [[id number]]  Level of Care: Med-Surg [16]  Covid Evaluation: Asymptomatic - no recent exposure (last 10 days) testing not required  Diagnosis: Acute pancreatitis [577.0.ICD-9-CM]  Admitting Physician: Jacques Navy [[id number]]  Attending Physician: Jacques Navy [[id number]]          B Medical/Surgery History Past Medical History:  Diagnosis Date   Anxiety    Arthritis    knees   Bipolar 1 disorder (HCC)    Chronic diarrhea    Diverticulosis of colon    GERD (gastroesophageal reflux disease)    Hiatal hernia    History of concussion    per pt 11/ 2020 w/ no residual   Hyperlipidemia    Hypertension    followed by pcp   (11-29-2019  per pt stated never had stress test)   MDD (major depressive disorder)    Migraine    neurologist--- dr Terrace Arabia   OSA on CPAP    followed by dr dohmeier   PCOS (polycystic ovarian syndrome)    PONV (postoperative nausea and vomiting)    Type  2 diabetes mellitus (HCC)    followed by pcp   (11-29-2019  pt stated checks blood sugar daily in am,  fasting sugar--- 96   Past Surgical History:  Procedure Laterality Date   ANAL FISSURE REPAIR  x3  last one  2007  approx.   BIOPSY  09/06/2019   Procedure: BIOPSY;  Surgeon: [name], MD;  [facility];  Service: Endoscopy;;   COLONOSCOPY WITH PROPOFOL [name] 09/06/2019   pancolonic diverticulosis. Segmental biopsy completed with benign biopsies.    ESOPHAGOGASTRODUODENOSCOPY (EGD) WITH PROPOFOL [name] 06/16/2020   Dr. [name]: normal esophagus s/p dilation, normal stomach, duodenum.    ESOPHAGOGASTRODUODENOSCOPY (EGD) WITH PROPOFOL [name] 02/10/2022   Procedure: ESOPHAGOGASTRODUODENOSCOPY (EGD) WITH PROPOFOL;  Surgeon: [name], MD;  [facility];  Service: Endoscopy;  Laterality: [name];  9:45am   EVALUATION UNDER ANESTHESIA WITH HEMORRHOIDECTOMY [name] 12/05/2019   Procedure: ANORECTAL EXAM UNDER ANESTHESIA WITH HEMORRHOIDECTOMY, HEMORRHOIDAL LIGATION/PEXY;  Surgeon: [name], MD;  Location: [facility];  Service: General;  Laterality: [name];  GENERAL AND LOCAL   EVALUATION UNDER ANESTHESIA WITH TEAR DUCT PROBING  infant   KNEE ARTHROSCOPY Right 1994   LAPAROSCOPIC CHOLECYSTECTOMY  08-06-2002  @[facility]    LUMBAR LAMINECTOMY/DECOMPRESSION MICRODISCECTOMY Right 01/22/2014   Procedure: LUMBAR FOUR TO FIVE LUMBAR LAMINECTOMY/DECOMPRESSION MICRODISCECTOMY 1 LEVEL;  Surgeon: Karn Cassis,  MD;  Location: [facility];  Service: Neurosurgery;  Laterality: Right;  Right L45 diskectomy   MALONEY DILATION [name] 06/16/2020   Procedure: Elease [name] DILATION;  Surgeon: [name], MD;  Location[facility];  Service: Endoscopy;  Laterality: [name];   MALONEY DILATION [name] 02/10/2022   Procedure: Elease [name] DILATION;  Surgeon: [name], MD;  Location: [facility];  Service: Endoscopy;  Laterality: [name];     A IV Location/Drains/Wounds Patient Lines/Drains/Airways  Status     Active Line/Drains/Airways     Name Placement date Placement time Site Days   Peripheral IV 01/26/23 20 G 1" Anterior;Right Forearm 01/26/23  2013  Forearm  1            Intake/Output Last 24 hours No intake or output data in the 24 hours ending 01/27/23 0753  Labs/Imaging Results for orders placed or performed during the hospital encounter of 01/26/23 (from the past 48 hour(s))  Lipase, blood     Status: Abnormal   Collection Time: 01/26/23  5:07 PM  Result Value Ref Range   Lipase 60 (H) 11 - 51 U/L    Comment: Performed at [facility], [address], [place], [place] [address]  Comprehensive metabolic panel     Status: Abnormal   Collection Time: 01/26/23  5:07 PM  Result Value Ref Range   Sodium 133 (L) 135 - 145 mmol/L   Potassium 4.0 3.5 - 5.1 mmol/L   Chloride 98 98 - 111 mmol/L   CO2 21 (L) 22 - 32 mmol/L   Glucose, Bld 181 (H) 70 - 99 mg/dL    Comment: Glucose reference range applies only to samples taken after fasting for at least 8 hours.   BUN 15 6 - 20 mg/dL   Creatinine, Ser 4.69 0.44 - 1.00 mg/dL   Calcium 8.3 (L) 8.9 - 10.3 mg/dL   Total Protein 6.8 6.5 - 8.1 g/dL   Albumin 3.3 (L) 3.5 - 5.0 g/dL   AST 21 15 - 41 U/L   ALT 18 0 - 44 U/L   Alkaline Phosphatase 55 38 - 126 U/L   Total Bilirubin 1.4 (H) 0.3 - 1.2 mg/dL   GFR, Estimated >62 >95 mL/min    Comment: (NOTE) Calculated using the CKD-EPI Creatinine Equation (2021)    Anion gap 14 5 - 15    Comment: Performed at [facility], [address], [place], [place] [address]  CBC     Status: Abnormal   Collection Time: 01/26/23  5:07 PM  Result Value Ref Range   WBC 9.1 4.0 - 10.5 K/uL   RBC 5.03 3.87 - 5.11 MIL/uL   Hemoglobin 14.3 12.0 - 15.0 g/dL   HCT 24.4 01.0 - 27.2 %   MCV 85.9 80.0 - 100.0 fL   MCH 28.4 26.0 - 34.0 pg   MCHC 33.1 30.0 - 36.0 g/dL   RDW 53.6 (H) 64.4 - 03.4 %   Platelets 146 (L) 150 - 400 K/uL    Comment: SPECIMEN CHECKED FOR CLOTS REPEATED TO VERIFY     nRBC 0.0 0.0 - 0.2 %    Comment: Performed at [facility], [address] [place], [place] [address]  Urinalysis, Routine w reflex microscopic -Urine, Clean Catch     Status: Abnormal   Collection Time: 01/26/23  6:00 PM  Result Value Ref Range   Color, Urine YELLOW YELLOW   APPearance HAZY (A) CLEAR   Specific Gravity, Urine 1.025 1.005 - 1.030   pH  6.0 5.0 - 8.0   Glucose, UA 500 (A) NEGATIVE mg/dL    Comment: RESULT CALLED TO, READ BACK BY AND VERIFIED WITH: RAGLAND,M ON 01/26/23 AT 1920 BY LOY,C CORRECTED ON 07/03 AT 1921: PREVIOUSLY REPORTED AS NEGATIVE    Hgb urine dipstick NEGATIVE NEGATIVE   Bilirubin Urine SMALL (A) NEGATIVE   Ketones, ur 80 (A) NEGATIVE mg/dL   Protein, ur 30 (A) NEGATIVE mg/dL   Nitrite NEGATIVE NEGATIVE   Leukocytes,Ua NEGATIVE NEGATIVE    Comment: Performed at [facility], [address], [place], [place] [address]  Urinalysis, Microscopic (reflex)     Status: None   Collection Time: 01/26/23  6:00 PM  Result Value Ref Range   RBC / HPF NONE SEEN 0 - 5 RBC/hpf   WBC, UA 0-5 0 - 5 WBC/hpf   Bacteria, UA NONE SEEN NONE SEEN   Squamous Epithelial / HPF 0-5 0 - 5 /HPF    Comment: Performed at [facility], [address] [place], [place] [address]   DG ABD ACUTE 2+V W 1V CHEST  Result Date: 01/26/2023 CLINICAL DATA:  History of pancreatitis. Worsening pain. Questionable ileus on prior CT. EXAM: DG ABDOMEN ACUTE WITH 1 VIEW CHEST COMPARISON:  CT abdomen and pelvis 01/25/2023 FINDINGS: There is no evidence of dilated bowel loops or free intraperitoneal air. Relative paucity of bowel gas. No radiopaque calculi or other significant radiographic abnormality is seen. Heart size and mediastinal contours are within normal limits. Low lung volumes with basilar atelectasis or infiltrates. IMPRESSION: No evidence of bowel obstruction. Low lung volumes with basilar atelectasis or infiltrates. Electronically Signed   By: Minerva Fester M.D.   On: 01/26/2023 21:16     Pending Labs Unresulted Labs (From admission, onward)     Start     Ordered   02/02/23 0500  Creatinine, serum  (enoxaparin (LOVENOX)    CrCl < 30 ml/min)  Once,   R       Comments: while on enoxaparin therapy.    01/26/23 2339   01/26/23 2338  HIV Antibody (routine testing w rflx)  (HIV Antibody (Routine testing w reflex) panel)  Add-on,   AD        01/26/23 2339   01/26/23 2337  Hemoglobin A1c  (Glycemic Control (SSI)  Q 4 Hours / Glycemic Control (SSI)  AC +/- HS)  Add-on,   AD       Comments: To assess prior glycemic control    01/26/23 2339            Vitals/Pain Today's Vitals   01/27/23 0430 01/27/23 0600 01/27/23 0602 01/27/23 0630  BP: 99/65 107/75  102/67  Pulse: 99 (!) 103  (!) 104  Resp: 20 20  20   Temp:  98.8 F (37.1 C)    TempSrc:  Oral    SpO2: 95% 96%  92%  Weight:      Height:      PainSc:   0-No pain     Isolation Precautions No active isolations  Medications Medications  lisinopril (ZESTRIL) tablet 5 mg (has no administration in time range)  omega-3 acid ethyl esters (LOVAZA) capsule 4 g (has no administration in time range)  rosuvastatin (CRESTOR) tablet 20 mg (has no administration in time range)  ARIPiprazole (ABILIFY) tablet 20 mg (has no administration in time range)  buPROPion (WELLBUTRIN XL) 24 hr tablet 450 mg (has no administration in time range)  DULoxetine (CYMBALTA) DR capsule 120 mg (120 mg Oral Given 01/27/23 0122)  traZODone (DESYREL) tablet 400 mg (400 mg Oral Given 01/27/23 0121)  insulin glargine-yfgn (SEMGLEE) injection 33 Units (has no administration in time range)  metFORMIN (GLUCOPHAGE-XR) 24 hr tablet 500 mg (has no administration in time range)  pantoprazole (PROTONIX) EC tablet 40 mg (has no administration in time range)  ondansetron (ZOFRAN-ODT) disintegrating tablet 4 mg (has no administration in time range)  clonazePAM (KLONOPIN) tablet 1 mg (has no administration in time range)  gabapentin (NEURONTIN) capsule 300 mg  (300 mg Oral Given 01/27/23 0121)  topiramate (TOPAMAX) tablet 25 mg (has no administration in time range)  fluticasone furoate-vilanterol (BREO ELLIPTA) 100-25 MCG/ACT 1 puff (has no administration in time range)    And  umeclidinium bromide (INCRUSE ELLIPTA) 62.5 MCG/ACT 1 puff (has no administration in time range)  insulin aspart (novoLOG) injection 0-20 Units (has no administration in time range)  enoxaparin (LOVENOX) injection 50 mg (has no administration in time range)  0.45 % sodium chloride infusion ( Intravenous New Bag/Given 01/27/23 0002)  ketorolac (TORADOL) 30 MG/ML injection 30 mg (30 mg Intravenous Given 01/27/23 0602)  acetaminophen (TYLENOL) tablet 650 mg (650 mg Oral Given 01/27/23 0602)  HYDROmorphone (DILAUDID) injection 0.5 mg (has no administration in time range)  albuterol (PROVENTIL) (2.5 MG/3ML) 0.083% nebulizer solution 2.5 mg (has no administration in time range)  lipase/protease/amylase (CREON) capsule 72,000 Units (has no administration in time range)  sodium chloride 0.9 % bolus 1,000 mL (0 mLs Intravenous Stopped 01/26/23 2134)  fentaNYL (SUBLIMAZE) injection 50 mcg (50 mcg Intravenous Given 01/26/23 2014)  ondansetron (ZOFRAN) injection 4 mg (4 mg Intravenous Given 01/26/23 2014)  sodium chloride 0.9 % bolus 1,000 mL (1,000 mLs Intravenous New Bag/Given 01/26/23 2133)  fentaNYL (SUBLIMAZE) injection 100 mcg (100 mcg Intravenous Given 01/26/23 2133)    Mobility Walk     Focused Assessments    R Recommendations: See Admitting Provider Note  Report given to:   Additional Notes:

## 2023-01-27 NOTE — Hospital Course (Signed)
52 y/o with h/o DM2, bipolar I, OSA, GERD, obesity has had prior episodes of pancreatitis. She had a bout associated with ozempic. She had the sudden onset of epigastric pain several days ago. She went to [facility] ED where evaluation revealed acute pancreatitis with lipase > 2,000, CT abd revealed inflammatory changes head and tail of pancrease w/o abscess. She was given IV fluids and analgesia. She iimproved was sent home on Oxy IR and told to return if her symptoms returned. This morning she had recurrent pain and N/V. She went to her doctor where she was febrile and was referred to [facility].    ED Course: T 99.7  112/77  HR 111  R 30.Lab: glucose 181, Albumin 3.3, Lipase 60  LFTs nl, WBC 9.1, U/A negative. Patient was given IVF in ED and pain medication. TRH called to admit for observation and continued pain, nausea control.

## 2023-01-27 NOTE — Progress Notes (Signed)
PROGRESS NOTE   Jamie [name]  ZOX:[id number] DOB: 04-19-1971 DOA: 01/26/2023 PCP: Encarnacion Slates, PA-C   Chief Complaint  Patient presents with   Abdominal Pain   Level of care: Med-Surg  Brief Admission History:  52 y/o with h/o DM2, bipolar I, OSA, GERD, obesity has had prior episodes of pancreatitis. She had a bout associated with ozempic. She had the sudden onset of epigastric pain several days ago. She went to [facility] ED where evaluation revealed acute pancreatitis with lipase > 2,000, CT abd revealed inflammatory changes head and tail of pancrease w/o abscess. She was given IV fluids and analgesia. She iimproved was sent home on Oxy IR and told to return if her symptoms returned. This morning she had recurrent pain and N/V. She went to her doctor where she was febrile and was referred to [facility].    ED Course: T 99.7  112/77  HR 111  R 30.Lab: glucose 181, Albumin 3.3, Lipase 60  LFTs nl, WBC 9.1, U/A negative. Patient was given IVF in ED and pain medication. TRH called to admit for observation and continued pain, nausea control.      Assessment and Plan:  Acute pancreatitis Patient seen at [facility] ED 01/25/23. Het CT revealed pancreatic inflammation w/o abscess, lipase was >2000. Admitted with pain and N/V. - continues to have pain, not able to advance diet - if pain persists will need to hold all oral intake - continue supportive measures as ordered   DM2 (diabetes mellitus, type 2)  - continue current management  CBG (last 3)  Recent Labs    01/27/23 0841 01/27/23 1137  GLUCAP 181* 217*    GERD (gastroesophageal reflux disease) - Continue PPI for GI protection   OSA on CPAP May use CPAP from home  Bipolar I disorder, severe, current or most recent episode depressed, with psychotic features Continue home medications on which she is stable  DVT prophylaxis: enoxaparin Code Status: Full Family Communication:  Disposition: Status is: Inpatient   Consultants:    Procedures:   Antimicrobials:    Subjective: Pt barely able to tolerate full liquids due to persistent epigastric abdominal pain.  No emesis.    Objective: Vitals:   01/27/23 0822 01/27/23 0847 01/27/23 1217 01/27/23 1236  BP: 110/77  (!) 85/60 118/82  Pulse: (!) 107 (!) 101 96   Resp: 20 18 20    Temp: 98.2 F (36.8 C)  98.4 F (36.9 C)   TempSrc: Oral  Oral   SpO2: 97% 97% 97%   Weight:      Height:        Intake/Output Summary (Last 24 hours) at 01/27/2023 1511 Last data filed at 01/27/2023 1200 Gross per 24 hour  Intake 400.11 ml  Output --  Net 400.11 ml   Filed Weights   01/26/23 1627  Weight: 104.3 kg   Examination:  General exam: Appears calm and comfortable  Respiratory system: Clear to auscultation. Respiratory effort normal. Cardiovascular system: normal S1 & S2 heard. No JVD, murmurs, rubs, gallops or clicks. No pedal edema. Gastrointestinal system: Abdomen is nondistended, soft and diffusely tender in the epigastric area. No organomegaly or masses felt. Normal bowel sounds heard. Central nervous system: Alert and oriented. No focal neurological deficits. Extremities: Symmetric 5 x 5 power. Skin: No rashes, lesions or ulcers. Psychiatry: Judgement and insight appear normal. Mood & affect appropriate.   Data Reviewed: I have personally reviewed following labs and imaging studies  CBC: Recent Labs  Lab 01/26/23 1707  WBC 9.1  HGB 14.3  HCT 43.2  MCV 85.9  PLT 146*    Basic Metabolic Panel: Recent Labs  Lab 01/26/23 1707 01/27/23 0851  NA 133* 132*  K 4.0 3.9  CL 98 100  CO2 21* 22  GLUCOSE 181* 195*  BUN 15 17  CREATININE 0.80 0.83  CALCIUM 8.3* 7.4*    CBG: Recent Labs  Lab 01/27/23 0841 01/27/23 1137  GLUCAP 181* 217*    No results found for this or any previous visit (from the past 240 hour(s)).   Radiology Studies: DG ABD ACUTE 2+V W 1V CHEST  Result Date: 01/26/2023 CLINICAL DATA:  History of pancreatitis. Worsening  pain. Questionable ileus on prior CT. EXAM: DG ABDOMEN ACUTE WITH 1 VIEW CHEST COMPARISON:  CT abdomen and pelvis 01/25/2023 FINDINGS: There is no evidence of dilated bowel loops or free intraperitoneal air. Relative paucity of bowel gas. No radiopaque calculi or other significant radiographic abnormality is seen. Heart size and mediastinal contours are within normal limits. Low lung volumes with basilar atelectasis or infiltrates. IMPRESSION: No evidence of bowel obstruction. Low lung volumes with basilar atelectasis or infiltrates. Electronically Signed   By: Minerva Fester M.D.   On: 01/26/2023 21:16    Scheduled Meds:  acetaminophen  650 mg Oral Q6H   ARIPiprazole  20 mg Oral Daily   buPROPion  450 mg Oral Daily   clonazePAM  1 mg Oral Daily   clonazePAM  2 mg Oral QHS   DULoxetine  120 mg Oral QHS   enoxaparin (LOVENOX) injection  50 mg Subcutaneous Q24H   fluticasone furoate-vilanterol  1 puff Inhalation Daily   And   umeclidinium bromide  1 puff Inhalation Daily   gabapentin  300 mg Oral QHS   insulin aspart  0-20 Units Subcutaneous TID WC   insulin glargine-yfgn  33 Units Subcutaneous Daily   lipase/protease/amylase  72,000 Units Oral TID WC   omega-3 acid ethyl esters  4 capsule Oral Daily   pantoprazole  40 mg Oral Daily   rosuvastatin  20 mg Oral Daily   traZODone  400 mg Oral QHS   Continuous Infusions:  lactated ringers 125 mL/hr at 01/27/23 1043    LOS: 0 days   Time spent: 39 mins  Jamie Ruble Laural Benes, MD How to contact the [facility] Attending or Consulting provider 7A - 7P or covering provider during after hours 7P -7A, for this patient?  Check the care team in [facility] and look for a) attending/consulting TRH provider listed and b) the [facility] team listed Log into [email or web address] and use [facility] universal password to access. If you do not have the password, please contact the hospital operator. Locate the [facility] provider you are looking for under [facility] and page to a  number that you can be directly reached. If you still have difficulty reaching the provider, please page the [facility] (Director on Call) for the Hospitalists listed on amion for assistance.  01/27/2023, 3:11 PM

## 2023-01-27 NOTE — Progress Notes (Addendum)
Transition of Care [facility]) - Inpatient Brief Assessment   Patient Details  Name: Jamie [name] MRN: [id number] Date of Birth: 10/09/1970  Transition of Care [facility]) CM/SW Contact:    Leitha Bleak, RN Phone Number: 01/27/2023, 11:02 AM   Clinical Narrative:  Admitted with  Acute pancreatitis. No needs identified. TOC following.    Transition of Care Asessment: Insurance and Status: (P) Insurance coverage has been reviewed Patient has primary care physician: (P) Yes Home environment has been reviewed: (P) Home with Spouse Prior level of function:: (P) independent Prior/Current Home Services: (P) No current home services Social Determinants of Health Reivew: (P) SDOH reviewed no interventions necessary Readmission risk has been reviewed: (P) Yes Transition of care needs: (P) no transition of care needs at this time

## 2023-01-28 DIAGNOSIS — E781 Pure hyperglyceridemia: Secondary | ICD-10-CM | POA: Diagnosis present

## 2023-01-28 DIAGNOSIS — K8500 Idiopathic acute pancreatitis without necrosis or infection: Secondary | ICD-10-CM | POA: Diagnosis not present

## 2023-01-28 LAB — GLUCOSE, CAPILLARY
Glucose-Capillary: 140 mg/dL — ABNORMAL HIGH (ref 70–99)
Glucose-Capillary: 137 mg/dL — ABNORMAL HIGH (ref 70–99)
Glucose-Capillary: 174 mg/dL — ABNORMAL HIGH (ref 70–99)
Glucose-Capillary: 196 mg/dL — ABNORMAL HIGH (ref 70–99)
Glucose-Capillary: 177 mg/dL — ABNORMAL HIGH (ref 70–99)

## 2023-01-28 LAB — COMPREHENSIVE METABOLIC PANEL
ALT: 20 U/L (ref 0–44)
AST: 25 U/L (ref 15–41)
Albumin: 2.7 g/dL — ABNORMAL LOW (ref 3.5–5.0)
Alkaline Phosphatase: 77 U/L (ref 38–126)
Anion gap: 8 (ref 5–15)
BUN: 16 mg/dL (ref 6–20)
CO2: 23 mmol/L (ref 22–32)
Calcium: 7.3 mg/dL — ABNORMAL LOW (ref 8.9–10.3)
Chloride: 101 mmol/L (ref 98–111)
Creatinine, Ser: 0.76 mg/dL (ref 0.44–1.00)
GFR, Estimated: >60 mL/min (ref >60)
Glucose, Bld: 134 mg/dL — ABNORMAL HIGH (ref 70–99)
Potassium: 3.5 mmol/L (ref 3.5–5.1)
Sodium: 132 mmol/L — ABNORMAL LOW (ref 135–145)
Total Bilirubin: 0.6 mg/dL (ref 0.3–1.2)
Total Protein: 6.2 g/dL — ABNORMAL LOW (ref 6.5–8.1)

## 2023-01-28 LAB — CBC
HCT: 35.4 % — ABNORMAL LOW (ref 36.0–46.0)
Hemoglobin: 11.2 g/dL — ABNORMAL LOW (ref 12.0–15.0)
MCH: 28.2 pg (ref 26.0–34.0)
MCHC: 31.6 g/dL (ref 30.0–36.0)
MCV: 89.2 fL (ref 80.0–100.0)
Platelets: 121 10*3/uL — ABNORMAL LOW (ref 150–400)
RBC: 3.97 MIL/uL (ref 3.87–5.11)
RDW: 17 % — ABNORMAL HIGH (ref 11.5–15.5)
WBC: 8.9 10*3/uL (ref 4.0–10.5)
nRBC: 0 % (ref 0.0–0.2)

## 2023-01-28 MED ORDER — GEMFIBROZIL 600 MG PO TABS
600.0000 mg | ORAL_TABLET | Freq: Two times a day (BID) | ORAL | Status: DC
  Administered 2023-01-28 – 2023-01-29 (×3): 600 mg via ORAL
  Filled 2023-01-28 (×3): qty 1

## 2023-01-28 MED ORDER — INSULIN ASPART 100 UNIT/ML IJ SOLN: 0.0000 [IU] | Freq: Every day | INTRAMUSCULAR | Status: DC

## 2023-01-28 MED ORDER — INSULIN ASPART 100 UNIT/ML IJ SOLN
0.0000 [IU] | Freq: Three times a day (TID) | INTRAMUSCULAR | Status: DC
Start: 2023-01-28 — End: 2023-01-28

## 2023-01-28 MED ORDER — GEMFIBROZIL 600 MG PO TABS: 600.0000 mg | ORAL_TABLET | Freq: Two times a day (BID) | ORAL | Status: DC

## 2023-01-28 NOTE — Final Progress Note (Signed)
Patient took off her CPAP machine.Stated, " I took it off, this one is not like the one I have at home, it just goes up my nose". Patient is awake and talking with her mother that remains at bedside.

## 2023-01-28 NOTE — Care Management Important Message (Signed)
Important Message  Patient Details  Name: Jamie [name] MRN: [id number] Date of Birth: 08-26-1970   Medicare Important Message Given:  Yes     Corey Harold 01/28/2023, 1:37 PM

## 2023-01-28 NOTE — Progress Notes (Signed)
PROGRESS NOTE   Jamie [name]  ZOX:[id number] DOB: 10-10-1970 DOA: 01/26/2023 PCP: Encarnacion Slates, PA-C   Chief Complaint  Patient presents with   Abdominal Pain   Level of care: Med-Surg  Brief Admission History:  52 y/o with h/o DM2, bipolar I, OSA, GERD, obesity has had prior episodes of pancreatitis. She had a bout associated with ozempic. She had the sudden onset of epigastric pain several days ago. She went to [facility] ED where evaluation revealed acute pancreatitis with lipase > 2,000, CT abd revealed inflammatory changes head and tail of pancrease w/o abscess. She was given IV fluids and analgesia. She iimproved was sent home on Oxy IR and told to return if her symptoms returned. This morning she had recurrent pain and N/V. She went to her doctor where she was febrile and was referred to [facility].    ED Course: T 99.7  112/77  HR 111  R 30.Lab: glucose 181, Albumin 3.3, Lipase 60  LFTs nl, WBC 9.1, U/A negative. Patient was given IVF in ED and pain medication. [facility] called to admit for observation and continued pain, nausea control.      Assessment and Plan:  Acute pancreatitis Patient seen at [facility] ED 01/25/23. Het CT revealed pancreatic inflammation w/o abscess, lipase was >2000. Admitted with pain and N/V. - continues to have pain, not able to advance diet - if pain persists will need to hold all oral intake - continue supportive measures as ordered   Severe Hypertriglyceridemia -pt reports ongoing problem difficult to control -triglycerides >800 but pt reports can be as high as 14K -pt is on rosuvastatin 20 mg and lovaza 4 gm  -added gemfibrozil 600 mg BID  -if we can get triglycerides below 500 she would have less bouts of pancreatitis   DM2 (diabetes mellitus, type 2)  - continue current management  CBG (last 3)  Recent Labs    01/28/23 0314 01/28/23 0727 01/28/23 1120  GLUCAP 140* 137* 174*    GERD (gastroesophageal reflux disease) - Continue PPI for GI protection    OSA on CPAP May use CPAP from home  Bipolar I disorder, severe, current or most recent episode depressed, with psychotic features Continued home medications on which she is stable  DVT prophylaxis: enoxaparin Code Status: Full Family Communication:  Disposition: Status is: Inpatient   Consultants:   Procedures:   Antimicrobials:    Subjective: Pt says her triglycerides have been difficult to control.  She is tolerating full liquids now, wanting to advance to soft diet.     Objective: Vitals:   01/27/23 2046 01/28/23 0312 01/28/23 0923 01/28/23 1315  BP: 104/70 107/72  101/65  Pulse: (!) 109 (!) 101  (!) 104  Resp: 18 20  18   Temp: 100 F (37.8 C) 98.2 F (36.8 C)  98.5 F (36.9 C)  TempSrc:      SpO2: 94% 93% 90% 92%  Weight:      Height:        Intake/Output Summary (Last 24 hours) at 01/28/2023 1538 Last data filed at 01/28/2023 8119 Gross per 24 hour  Intake 240 ml  Output --  Net 240 ml   Filed Weights   01/26/23 1627  Weight: 104.3 kg   Examination:  General exam: Appears calm and comfortable  Respiratory system: Clear to auscultation. Respiratory effort normal. Cardiovascular system: normal S1 & S2 heard. No JVD, murmurs, rubs, gallops or clicks. No pedal edema. Gastrointestinal system: Abdomen is nondistended, soft and less diffusely  tender in the epigastric area. No organomegaly or masses felt. Normal bowel sounds heard. Central nervous system: Alert and oriented. No focal neurological deficits. Extremities: Symmetric 5 x 5 power. Skin: No rashes, lesions or ulcers. Psychiatry: Judgement and insight appear normal. Mood & affect appropriate.   Data Reviewed: I have personally reviewed following labs and imaging studies  CBC: Recent Labs  Lab 01/26/23 1707 01/28/23 0451  WBC 9.1 8.9  HGB 14.3 11.2*  HCT 43.2 35.4*  MCV 85.9 89.2  PLT 146* 121*    Basic Metabolic Panel: Recent Labs  Lab 01/26/23 1707 01/27/23 0851 01/28/23 0451  NA  133* 132* 132*  K 4.0 3.9 3.5  CL 98 100 101  CO2 21* 22 23  GLUCOSE 181* 195* 134*  BUN 15 17 16   CREATININE 0.80 0.83 0.76  CALCIUM 8.3* 7.4* 7.3*    CBG: Recent Labs  Lab 01/27/23 1619 01/27/23 2047 01/28/23 0314 01/28/23 0727 01/28/23 1120  GLUCAP 166* 157* 140* 137* 174*    No results found for this or any previous visit (from the past 240 hour(s)).   Radiology Studies: DG ABD ACUTE 2+V W 1V CHEST  Result Date: 01/26/2023 CLINICAL DATA:  History of pancreatitis. Worsening pain. Questionable ileus on prior CT. EXAM: DG ABDOMEN ACUTE WITH 1 VIEW CHEST COMPARISON:  CT abdomen and pelvis 01/25/2023 FINDINGS: There is no evidence of dilated bowel loops or free intraperitoneal air. Relative paucity of bowel gas. No radiopaque calculi or other significant radiographic abnormality is seen. Heart size and mediastinal contours are within normal limits. Low lung volumes with basilar atelectasis or infiltrates. IMPRESSION: No evidence of bowel obstruction. Low lung volumes with basilar atelectasis or infiltrates. Electronically Signed   By: Minerva Fester M.D.   On: 01/26/2023 21:16    Scheduled Meds:  acetaminophen  650 mg Oral Q6H   ARIPiprazole  20 mg Oral Daily   buPROPion  450 mg Oral Daily   clonazePAM  1 mg Oral Daily   clonazePAM  2 mg Oral QHS   DULoxetine  120 mg Oral QHS   enoxaparin (LOVENOX) injection  50 mg Subcutaneous Q24H   fluticasone furoate-vilanterol  1 puff Inhalation Daily   And   umeclidinium bromide  1 puff Inhalation Daily   gabapentin  300 mg Oral QHS   gemfibrozil  600 mg Oral BID AC   insulin aspart  0-20 Units Subcutaneous TID WC   insulin aspart  0-5 Units Subcutaneous QHS   insulin glargine-yfgn  33 Units Subcutaneous Daily   lipase/protease/amylase  72,000 Units Oral TID WC   omega-3 acid ethyl esters  4 capsule Oral Daily   pantoprazole  40 mg Oral Daily   rosuvastatin  20 mg Oral Daily   traZODone  400 mg Oral QHS   Continuous Infusions:   lactated ringers 125 mL/hr at 01/28/23 0247    LOS: 1 day   Time spent: 41 mins  Daun Rens, MD How to contact the [facility] Attending or Consulting provider 7A - 7P or covering provider during after hours 7P -7A, for this patient?  Check the care team in [facility] and look for a) attending/consulting [facility] provider listed and b) the [facility] team listed Log into [email or web address] and use [facility] universal password to access. If you do not have the password, please contact the hospital operator. Locate the [facility] provider you are looking for under [facility] and page to a number that you can be directly reached. If you still have difficulty reaching  the provider, please page the [facility] (Director on Call) for the Hospitalists listed on amion for assistance.  01/28/2023, 3:38 PM

## 2023-01-28 NOTE — Progress Notes (Signed)
Patient declined CPAP for tonight. Stated she did not like the mask. Told patient to call if she changed her mind and wanted to wear machine. Hospital unit at bedside.

## 2023-01-29 DIAGNOSIS — K8500 Idiopathic acute pancreatitis without necrosis or infection: Secondary | ICD-10-CM | POA: Diagnosis not present

## 2023-01-29 DIAGNOSIS — E781 Pure hyperglyceridemia: Secondary | ICD-10-CM

## 2023-01-29 DIAGNOSIS — F315 Bipolar disorder, current episode depressed, severe, with psychotic features: Secondary | ICD-10-CM

## 2023-01-29 DIAGNOSIS — K219 Gastro-esophageal reflux disease without esophagitis: Secondary | ICD-10-CM

## 2023-01-29 LAB — COMPREHENSIVE METABOLIC PANEL
ALT: 30 U/L (ref 0–44)
AST: 37 U/L (ref 15–41)
Albumin: 2.6 g/dL — ABNORMAL LOW (ref 3.5–5.0)
Alkaline Phosphatase: 132 U/L — ABNORMAL HIGH (ref 38–126)
Anion gap: 8 (ref 5–15)
BUN: 13 mg/dL (ref 6–20)
CO2: 25 mmol/L (ref 22–32)
Calcium: 7.9 mg/dL — ABNORMAL LOW (ref 8.9–10.3)
Chloride: 101 mmol/L (ref 98–111)
Creatinine, Ser: 0.74 mg/dL (ref 0.44–1.00)
GFR, Estimated: >60 mL/min (ref >60)
Glucose, Bld: 161 mg/dL — ABNORMAL HIGH (ref 70–99)
Potassium: 3.9 mmol/L (ref 3.5–5.1)
Sodium: 134 mmol/L — ABNORMAL LOW (ref 135–145)
Total Bilirubin: 0.6 mg/dL (ref 0.3–1.2)
Total Protein: 6.3 g/dL — ABNORMAL LOW (ref 6.5–8.1)

## 2023-01-29 LAB — GLUCOSE, CAPILLARY
Glucose-Capillary: 143 mg/dL — ABNORMAL HIGH (ref 70–99)
Glucose-Capillary: 161 mg/dL — ABNORMAL HIGH (ref 70–99)
Glucose-Capillary: 194 mg/dL — ABNORMAL HIGH (ref 70–99)

## 2023-01-29 LAB — TRIGLYCERIDES: Triglycerides: 779 mg/dL — ABNORMAL HIGH (ref <150)

## 2023-01-29 NOTE — Discharge Instructions (Signed)
IMPORTANT INFORMATION: PAY CLOSE ATTENTION   PHYSICIAN DISCHARGE INSTRUCTIONS  Follow with Primary care provider  Encarnacion Slates, PA-C  and other consultants as instructed by your Hospitalist Physician  SEEK MEDICAL CARE OR RETURN TO EMERGENCY ROOM IF SYMPTOMS COME BACK, WORSEN OR NEW PROBLEM DEVELOPS   Please note: You were cared for by a hospitalist during your hospital stay. Every effort will be made to forward records to your primary care provider.  You can request that your primary care provider send for your hospital records if they have not received them.  Once you are discharged, your primary care physician will handle any further medical issues. Please note that NO REFILLS for any discharge medications will be authorized once you are discharged, as it is imperative that you return to your primary care physician (or establish a relationship with a primary care physician if you do not have one) for your post hospital discharge needs so that they can reassess your need for medications and monitor your lab values.  Please get a complete blood count and chemistry panel checked by your Primary MD at your next visit, and again as instructed by your Primary MD.  Get Medicines reviewed and adjusted: Please take all your medications with you for your next visit with your Primary MD  Laboratory/radiological data: Please request your Primary MD to go over all hospital tests and procedure/radiological results at the follow up, please ask your primary care provider to get all Hospital records sent to his/her office.  In some cases, they will be blood work, cultures and biopsy results pending at the time of your discharge. Please request that your primary care provider follow up on these results.  If you are diabetic, please bring your blood sugar readings with you to your follow up appointment with primary care.    Please call and make your follow up appointments as soon as possible.    Also Note  the following: If you experience worsening of your admission symptoms, develop shortness of breath, life threatening emergency, suicidal or homicidal thoughts you must seek medical attention immediately by calling 911 or calling your MD immediately  if symptoms less severe.  You must read complete instructions/literature along with all the possible adverse reactions/side effects for all the Medicines you take and that have been prescribed to you. Take any new Medicines after you have completely understood and accpet all the possible adverse reactions/side effects.   Do not drive when taking Pain medications or sleeping medications (Benzodiazepines)  Do not take more than prescribed Pain, Sleep and Anxiety Medications. It is not advisable to combine anxiety,sleep and pain medications without talking with your primary care practitioner  Special Instructions: If you have smoked or chewed Tobacco  in the last 2 yrs please stop smoking, stop any regular Alcohol  and or any Recreational drug use.  Wear Seat belts while driving.  Do not drive if taking any narcotic, mind altering or controlled substances or recreational drugs or alcohol.

## 2023-01-29 NOTE — Discharge Summary (Signed)
Physician Discharge Summary  STEPANIE [name] QIO:[id number] DOB: 03-07-1971 DOA: 01/26/2023  PCP: Encarnacion Slates, PA-C  Admit date: 01/26/2023 Discharge date: 01/29/2023  Admitted From:  Home  Disposition: Home   Recommendations for Outpatient Follow-up:  Follow up with PCP in 1 weeks Referral made to advanced lipid disorders clinic for severe hypertriglyceridemia  Discharge Condition: STABLE   CODE STATUS: FULL  DIET: hypertriglyceridemia eating plan provided and recommended   Brief Hospitalization Summary: Please see all hospital notes, images, labs for full details of the hospitalization. ADMISSION PROVIDER HPI:  52 y/o with h/o DM2, bipolar I, OSA, GERD, obesity has had prior episodes of pancreatitis. She had a bout associated with ozempic. She had the sudden onset of epigastric pain several days ago. She went to [facility] ED where evaluation revealed acute pancreatitis with lipase > 2,000, CT abd revealed inflammatory changes head and tail of pancrease w/o abscess. She was given IV fluids and analgesia. She iimproved was sent home on Oxy IR and told to return if her symptoms returned. This morning she had recurrent pain and N/V. She went to her doctor where she was febrile and was referred to [facility].    ED Course: T 99.7  112/77  HR 111  R 30.Lab: glucose 181, Albumin 3.3, Lipase 60  LFTs nl, WBC 9.1, U/A negative. Patient was given IVF in ED and pain medication. TRH called to admit for observation and continued pain, nausea control.   Hospital course by problem   Acute pancreatitis Patient seen at [facility] ED 01/25/23. Het CT revealed pancreatic inflammation w/o abscess, lipase was >2000. Admitted with pain and N/V. - tolerated advanced diet and pain resolved  - treated with supportive measures   Severe Hypertriglyceridemia -pt reports ongoing problem difficult to control -triglycerides >800 but pt reports can be as high as 14K -pt is on rosuvastatin 20 mg and lovaza 4 gm  -referral made to  advanced lipid disorders clinic due to triglycerides in 700-800 range -if we can get triglycerides below 500 she would have less bouts of pancreatitis    DM2 (diabetes mellitus, type 2)  - continue current management  CBG (last 3)  Recent Labs    01/29/23 0243 01/29/23 0724 01/29/23 1119  GLUCAP 143* 161* 194*    GERD (gastroesophageal reflux disease) - Continue PPI for GI protection    OSA on CPAP May use CPAP from home   Bipolar I disorder, severe, current or most recent episode depressed, with psychotic features Continued home medications on which she is stable   Discharge Diagnoses:  Principal Problem:   Acute pancreatitis Active Problems:   Hypertriglyceridemia without hypercholesterolemia   Bipolar I disorder, severe, current or most recent episode depressed, with psychotic features (HCC)   OSA on CPAP   GERD (gastroesophageal reflux disease)   DM2 (diabetes mellitus, type 2) (HCC)   Discharge Instructions: Discharge Instructions     AMB Referral to Advanced Lipid Disorders Clinic   Complete by: As directed    Internal Lipid Clinic Referral Scheduling  Internal lipid clinic referrals are providers within [facility], who wish to refer established patients for routine management (help in starting PCSK9 inhibitor therapy) or advanced therapies.  Internal MD referral criteria:              1. All patients with LDL>190 mg/dL  2. All patients with Triglycerides >500 mg/dL  3. Patients with suspected or confirmed heterozygous familial hyperlipidemia (HeFH) or homozygous familial hyperlipidemia (HoFH)  4. Patients with family  history of suspicious for genetic dyslipidemia desiring genetic testing  5. Patients refractory to standard guideline based therapy  6. Patients with statin intolerance (failed 2 statins, one of which must be a high potency statin)  7. Patients who the provider desires to be seen by MD   Internal PharmD referral criteria:   1. Follow-up patients  for medication management  2. Follow-up for compliance monitoring  3. Patients for drug education  4. Patients with statin intolerance  5. PCSK9 inhibitor education and prior authorization approvals  6. Patients with triglycerides <500 mg/dL  External Lipid Clinic Referral  External lipid clinic referrals are for providers outside of [facility], considered new clinic patients - automatically routed to MD schedule      Allergies as of 01/29/2023       Reactions   Codeine Hives, Swelling   Morphine And Codeine Swelling, Rash        Medication List     STOP taking these medications    albuterol 108 (90 Base) MCG/ACT inhaler Commonly known as: VENTOLIN HFA   amoxicillin-clavulanate 875-125 MG tablet Commonly known as: AUGMENTIN   topiramate 25 MG tablet Commonly known as: TOPAMAX       TAKE these medications    ARIPiprazole 20 MG tablet Commonly known as: ABILIFY Take 1 tablet (20 mg total) by mouth daily.   buPROPion 150 MG 24 hr tablet Commonly known as: Wellbutrin XL Take 3 tablets (450 mg total) by mouth daily.   clonazePAM 1 MG tablet Commonly known as: KlonoPIN Take 1 mg in the morning and 2 mg at night   DULoxetine 60 MG capsule Commonly known as: Cymbalta Take 2 capsules (120 mg total) by mouth at bedtime.   fluticasone 50 MCG/ACT nasal spray Commonly known as: FLONASE Place 1 spray into both nostrils at bedtime as needed for allergies or rhinitis.   gabapentin 300 MG capsule Commonly known as: NEURONTIN Take 600 mg by mouth at bedtime.   ibuprofen 600 MG tablet Commonly known as: ADVIL Take 600 mg by mouth 3 (three) times daily.   insulin glargine 100 UNIT/ML injection Commonly known as: LANTUS Inject 33 Units into the skin daily.   insulin lispro 100 UNIT/ML KwikPen Commonly known as: HUMALOG Inject 10 Units into the skin 3 (three) times daily.   lisinopril 5 MG tablet Commonly known as: ZESTRIL Take 5 mg by mouth daily.    metFORMIN 500 MG 24 hr tablet Commonly known as: GLUCOPHAGE-XR Take 500 mg by mouth daily.   NON FORMULARY Pt uses a cpap nightly   omega-3 acid ethyl esters 1 g capsule Commonly known as: LOVAZA Take 2 g by mouth 2 (two) times daily.   omeprazole 20 MG capsule Commonly known as: PRILOSEC Take 20 mg by mouth daily.   ondansetron 4 MG disintegrating tablet Commonly known as: ZOFRAN-ODT TAKE 1 TABLET BY MOUTH EVERY 8 HOURS AS NEEDED FOR NAUSEA AND VOMITING What changed: additional instructions   oxyCODONE 5 MG immediate release tablet Commonly known as: Oxy IR/ROXICODONE Take 5 mg by mouth every 6 (six) hours as needed for moderate pain.   rosuvastatin 20 MG tablet Commonly known as: CRESTOR Take 20 mg by mouth daily.   traZODone 100 MG tablet Commonly known as: DESYREL Take 4 tablets (400 mg total) by mouth at bedtime.   Trelegy Ellipta 100-62.5-25 MCG/ACT Aepb Generic drug: Fluticasone-Umeclidin-Vilant Inhale 1 puff into the lungs daily.   Zenpep 40000-126000 units Cpep Generic drug: Pancrelipase (Lip-Prot-Amyl) Take 2 capsules with  meals and 1 capsule with snacks.        Follow-up Information     Leavy Cella, Amy H, PA-C. Schedule an appointment as soon as possible for a visit in 1 week(s).   Specialty: General Practice Why: Hospital Follow Up Contact information: [facility] [address] [place] [address] [phone]         LIPID DISORDERS CLINIC. Schedule an appointment as soon as possible for a visit in 1 month(s).   Why: referral made, scheduler will reach out to you               Allergies  Allergen Reactions   Codeine Hives and Swelling   Morphine And Codeine Swelling and Rash   Allergies as of 01/29/2023       Reactions   Codeine Hives, Swelling   Morphine And Codeine Swelling, Rash        Medication List     STOP taking these medications    albuterol 108 (90 Base) MCG/ACT inhaler Commonly known as: VENTOLIN HFA    amoxicillin-clavulanate 875-125 MG tablet Commonly known as: AUGMENTIN   topiramate 25 MG tablet Commonly known as: TOPAMAX       TAKE these medications    ARIPiprazole 20 MG tablet Commonly known as: ABILIFY Take 1 tablet (20 mg total) by mouth daily.   buPROPion 150 MG 24 hr tablet Commonly known as: Wellbutrin XL Take 3 tablets (450 mg total) by mouth daily.   clonazePAM 1 MG tablet Commonly known as: KlonoPIN Take 1 mg in the morning and 2 mg at night   DULoxetine 60 MG capsule Commonly known as: Cymbalta Take 2 capsules (120 mg total) by mouth at bedtime.   fluticasone 50 MCG/ACT nasal spray Commonly known as: FLONASE Place 1 spray into both nostrils at bedtime as needed for allergies or rhinitis.   gabapentin 300 MG capsule Commonly known as: NEURONTIN Take 600 mg by mouth at bedtime.   ibuprofen 600 MG tablet Commonly known as: ADVIL Take 600 mg by mouth 3 (three) times daily.   insulin glargine 100 UNIT/ML injection Commonly known as: LANTUS Inject 33 Units into the skin daily.   insulin lispro 100 UNIT/ML KwikPen Commonly known as: HUMALOG Inject 10 Units into the skin 3 (three) times daily.   lisinopril 5 MG tablet Commonly known as: ZESTRIL Take 5 mg by mouth daily.   metFORMIN 500 MG 24 hr tablet Commonly known as: GLUCOPHAGE-XR Take 500 mg by mouth daily.   NON FORMULARY Pt uses a cpap nightly   omega-3 acid ethyl esters 1 g capsule Commonly known as: LOVAZA Take 2 g by mouth 2 (two) times daily.   omeprazole 20 MG capsule Commonly known as: PRILOSEC Take 20 mg by mouth daily.   ondansetron 4 MG disintegrating tablet Commonly known as: ZOFRAN-ODT TAKE 1 TABLET BY MOUTH EVERY 8 HOURS AS NEEDED FOR NAUSEA AND VOMITING What changed: additional instructions   oxyCODONE 5 MG immediate release tablet Commonly known as: Oxy IR/ROXICODONE Take 5 mg by mouth every 6 (six) hours as needed for moderate pain.   rosuvastatin 20 MG  tablet Commonly known as: CRESTOR Take 20 mg by mouth daily.   traZODone 100 MG tablet Commonly known as: DESYREL Take 4 tablets (400 mg total) by mouth at bedtime.   Trelegy Ellipta 100-62.5-25 MCG/ACT Aepb Generic drug: Fluticasone-Umeclidin-Vilant Inhale 1 puff into the lungs daily.   Zenpep 40000-126000 units Cpep Generic drug: Pancrelipase (Lip-Prot-Amyl) Take 2 capsules with  meals and 1 capsule with snacks.        Procedures/Studies: DG ABD ACUTE 2+V W 1V CHEST  Result Date: 01/26/2023 CLINICAL DATA:  History of pancreatitis. Worsening pain. Questionable ileus on prior CT. EXAM: DG ABDOMEN ACUTE WITH 1 VIEW CHEST COMPARISON:  CT abdomen and pelvis 01/25/2023 FINDINGS: There is no evidence of dilated bowel loops or free intraperitoneal air. Relative paucity of bowel gas. No radiopaque calculi or other significant radiographic abnormality is seen. Heart size and mediastinal contours are within normal limits. Low lung volumes with basilar atelectasis or infiltrates. IMPRESSION: No evidence of bowel obstruction. Low lung volumes with basilar atelectasis or infiltrates. Electronically Signed   By: Minerva Fester M.D.   On: 01/26/2023 21:16     Subjective: Pt reports pain is resolved and she is tolerating her diet well.    Discharge Exam: Vitals:   01/28/23 2111 01/29/23 0515  BP: 101/69 112/77  Pulse: (!) 101 (!) 104  Resp: 20 20  Temp: 99.2 F (37.3 C) 98.9 F (37.2 C)  SpO2: 92% 96%   Vitals:   01/28/23 1315 01/28/23 2006 01/28/23 2111 01/29/23 0515  BP: 101/65 106/73 101/69 112/77  Pulse: (!) 104 97 (!) 101 (!) 104  Resp: 18 20 20 20   Temp: 98.5 F (36.9 C) 99.9 F (37.7 C) 99.2 F (37.3 C) 98.9 F (37.2 C)  TempSrc:  Oral Oral   SpO2: 92% 93% 92% 96%  Weight:      Height:       General: Pt is alert, awake, not in acute distress Cardiovascular: RRR, S1/S2 +, no rubs, no gallops Respiratory: CTA bilaterally, no wheezing, no rhonchi Abdominal: Soft, NT, ND,  bowel sounds + Extremities: no edema, no cyanosis   The results of significant diagnostics from this hospitalization (including imaging, microbiology, ancillary and laboratory) are listed below for reference.     Microbiology: No results found for this or any previous visit (from the past 240 hour(s)).   Labs: BNP (last 3 results) No results for input(s): "BNP" in the last 8760 hours. Basic Metabolic Panel: Recent Labs  Lab 01/26/23 1707 01/27/23 0851 01/28/23 0451 01/29/23 0448  NA 133* 132* 132* 134*  K 4.0 3.9 3.5 3.9  CL 98 100 101 101  CO2 21* 22 23 25   GLUCOSE 181* 195* 134* 161*  BUN 15 17 16 13   CREATININE 0.80 0.83 0.76 0.74  CALCIUM 8.3* 7.4* 7.3* 7.9*   Liver Function Tests: Recent Labs  Lab 01/26/23 1707 01/27/23 0851 01/28/23 0451 01/29/23 0448  AST 21 19 25  37  ALT 18 18 20 30   ALKPHOS 55 53 77 132*  BILITOT 1.4* 1.1 0.6 0.6  PROT 6.8 6.4* 6.2* 6.3*  ALBUMIN 3.3* 2.9* 2.7* 2.6*   Recent Labs  Lab 01/26/23 1707  LIPASE 60*   No results for input(s): "AMMONIA" in the last 168 hours. CBC: Recent Labs  Lab 01/26/23 1707 01/28/23 0451  WBC 9.1 8.9  HGB 14.3 11.2*  HCT 43.2 35.4*  MCV 85.9 89.2  PLT 146* 121*   Cardiac Enzymes: No results for input(s): "CKTOTAL", "CKMB", "CKMBINDEX", "TROPONINI" in the last 168 hours. BNP: Invalid input(s): "POCBNP" CBG: Recent Labs  Lab 01/28/23 1625 01/28/23 2107 01/29/23 0243 01/29/23 0724 01/29/23 1119  GLUCAP 196* 177* 143* 161* 194*   D-Dimer No results for input(s): "DDIMER" in the last 72 hours. Hgb A1c Recent Labs    01/26/23 1707  HGBA1C 10.1*   Lipid Profile Recent Labs  01/27/23 0851 01/29/23 0448  CHOL 249*  --   HDL 24*  --   LDLCALC UNABLE TO CALCULATE IF TRIGLYCERIDE OVER 400 mg/dL  --   TRIG 161* 096*  CHOLHDL 10.4  --   LDLDIRECT 39  --    Thyroid function studies No results for input(s): "TSH", "T4TOTAL", "T3FREE", "THYROIDAB" in the last 72 hours.  Invalid  input(s): "FREET3" Anemia work up No results for input(s): "VITAMINB12", "FOLATE", "FERRITIN", "TIBC", "IRON", "RETICCTPCT" in the last 72 hours. Urinalysis    Component Value Date/Time   COLORURINE YELLOW 01/26/2023 1800   APPEARANCEUR HAZY (A) 01/26/2023 1800   APPEARANCEUR Cloudy 09/16/2011 1746   LABSPEC 1.025 01/26/2023 1800   LABSPEC 1.025 09/16/2011 1746   PHURINE 6.0 01/26/2023 1800   GLUCOSEU 500 (A) 01/26/2023 1800   GLUCOSEU Negative 09/16/2011 1746   HGBUR NEGATIVE 01/26/2023 1800   BILIRUBINUR SMALL (A) 01/26/2023 1800   BILIRUBINUR Negative 09/16/2011 1746   KETONESUR 80 (A) 01/26/2023 1800   PROTEINUR 30 (A) 01/26/2023 1800   UROBILINOGEN 0.2 11/20/2007 1843   NITRITE NEGATIVE 01/26/2023 1800   LEUKOCYTESUR NEGATIVE 01/26/2023 1800   LEUKOCYTESUR 2+ 09/16/2011 1746   Sepsis Labs Recent Labs  Lab 01/26/23 1707 01/28/23 0451  WBC 9.1 8.9   Microbiology No results found for this or any previous visit (from the past 240 hour(s)).  Time coordinating discharge: 43 mins   SIGNED:  Standley Dakins, MD  [facility] 01/29/2023, 11:44 AM How to contact the [facility] Attending or Consulting provider 7A - 7P or covering provider during after hours 7P -7A, for this patient?  Check the care team in [facility] and look for a) attending/consulting [facility] provider listed and b) the [facility] team listed Log into [email or web address] and use [place]'s universal password to access. If you do not have the password, please contact the hospital operator. Locate the [facility] provider you are looking for under [facility] and page to a number that you can be directly reached. If you still have difficulty reaching the provider, please page the [facility] (Director on Call) for the Hospitalists listed on amion for assistance.

## 2023-02-03 DIAGNOSIS — I10 Essential (primary) hypertension: Secondary | ICD-10-CM | POA: Diagnosis not present

## 2023-02-03 DIAGNOSIS — G4733 Obstructive sleep apnea (adult) (pediatric): Secondary | ICD-10-CM | POA: Diagnosis not present

## 2023-02-03 DIAGNOSIS — G4710 Hypersomnia, unspecified: Secondary | ICD-10-CM | POA: Diagnosis not present

## 2023-02-03 DIAGNOSIS — G4700 Insomnia, unspecified: Secondary | ICD-10-CM | POA: Diagnosis not present

## 2023-02-09 ENCOUNTER — Telehealth: Payer: Self-pay | Admitting: Pulmonary Disease

## 2023-02-09 NOTE — Telephone Encounter (Signed)
Patient would like to switch providers to the [place] office. Needs PFT and office visit. Patient phone number is [phone].

## 2023-02-11 NOTE — Telephone Encounter (Signed)
Covering for Dr. Francine Graven. He would be ok with this change for location preference

## 2023-02-11 NOTE — Telephone Encounter (Signed)
Fine with me - needs to bring all meds with him when he comes and needs to be a 30 min slot

## 2023-02-11 NOTE — Telephone Encounter (Signed)
Dr. Francine Graven patient would like to switch to [place] office Dr. Sherene Sires are you okay with seeing patient

## 2023-02-16 ENCOUNTER — Ambulatory Visit: Payer: Medicare Other | Admitting: Pulmonary Disease

## 2023-02-17 ENCOUNTER — Encounter: Payer: Medicare Other | Attending: General Practice | Admitting: Nutrition

## 2023-02-17 VITALS — Ht 63.0 in | Wt 219.0 lb

## 2023-02-17 DIAGNOSIS — E781 Pure hyperglyceridemia: Secondary | ICD-10-CM | POA: Insufficient documentation

## 2023-02-17 DIAGNOSIS — E1165 Type 2 diabetes mellitus with hyperglycemia: Secondary | ICD-10-CM | POA: Diagnosis not present

## 2023-02-17 DIAGNOSIS — E118 Type 2 diabetes mellitus with unspecified complications: Secondary | ICD-10-CM | POA: Diagnosis not present

## 2023-02-17 NOTE — Progress Notes (Signed)
Medical Nutrition Therapy  Appointment Start time:  1415  Appointment End time:  1515  Primary concerns today: Dm Type 2, Obesity,  Referral diagnosis: E11.8, 66.9 Preferred learning style: No preference Learning readiness: Ready    NUTRITION ASSESSMENT  52 yr old wfemale referred for Type 2 DM, Obesity. DM is poorly controlled. PCP Amy Leavy Cella. Just got out of hospital with Pancreatitis. History of Hypertriglyceridemia >1000 mg/dl.  This is her second bout with Pancreatitis from triglycerides. Had stopped Ozempic after the first pancreatitis.  A1C 10.1%. She has been referred to GI but not to Endocrinology yet. Lantus  33 units Lantus with 10 units of Humalog with meals. Is not currently on a sliding scale. 500 mg on Metformin,  Current diet poorly balanced and not consistent to meet her nutritional needs and help control blood sugars. Drinks a lot of diet sodas. DIet is low in fruits, vegetables and whole grains.  She is willing to work on following Lifestyle Medicine to help improve her health, lower her weight, TG and BS's.  Not physically active. Anthropometrics  Wt Readings from Last 3 Encounters:  02/17/23 219 lb (99.3 kg)  01/26/23 230 lb (104.3 kg)  11/30/22 230 lb 3.2 oz (104.4 kg)   Ht Readings from Last 3 Encounters:  02/17/23 5\' 3"  (1.6 m)  01/26/23 5\' 3"  (1.6 m)  11/30/22 5\' 3"  (1.6 m)   Body mass index is 38.79 kg/m. @BMIFA @ Facility age limit for growth %iles is 20 years. Facility age limit for growth %iles is 20 years.    Clinical Medical Hx: Hyperlipidemia, DM Type 2, Obesity, Neuropathy, COPD, poor sleep, depression,, GERD, HTN, PCOS  Medications: Lantus 33 units, Humalog 10 units with meals; Lovaza, Crestor , Fargixa 10 mg, OMega 3 acid ethyl Ester 2, twice a day. Labs: A1C 10.1%, TG 800->1000  Notable Signs/Symptoms: fatigue, increase thirst, frequent urination, poor sleep  Lifestyle & Dietary Hx Lives with family. Her sister orders groceries online and  then picks it up. Her sister usually does the cooking and orders the foods for the house..   Estimated daily fluid intake: 40 oz Supplements: Lovaza Sleep: 8 hrs Stress / self-care: No issues Current average weekly physical activity: Walks in some. Back issues.  24-Hr Dietary Recall First Meal: 2 pancakes with sf syrup, unsweet with splenda Snack:  Second Meal: Chicken sandwich rotisserie, wheat bread, unsweet tea Snack:  Third Meal: Homemade soup-vegetable, Pepsi zero, Snack: 1 cup milk- whole milk Beverages: water, diet sodas and unsweet tea  Estimated Energy Needs Calories: 1200 Carbohydrate: 135g Protein: 90g Fat: 33g   NUTRITION DIAGNOSIS  NI-1.7 Predicted excessive energy intake As related to Uncontrolled TYpd 2 DM, Hypertriglyceries and Obesity.  As evidenced by A1C 10.1%, BMI 39 and TG > 1000 mg/dl..   NUTRITION INTERVENTION  Nutrition education (E-1) on the following topics:  Nutrition and Diabetes education provided on My Plate, CHO counting, meal planning, portion sizes, timing of meals, avoiding snacks between meals unless having a low blood sugar, target ranges for A1C and blood sugars, signs/symptoms and treatment of hyper/hypoglycemia, monitoring blood sugars, taking medications as prescribed, benefits of exercising 30 minutes per day and prevention of complications of DM.  Lifestyle Medicine  - Whole Food, Plant Predominant Nutrition is highly recommended: Eat Plenty of vegetables, Mushrooms, fruits, Legumes, Whole Grains, Nuts, seeds in lieu of processed meats, processed snacks/pastries red meat, poultry, eggs.    -It is better to avoid simple carbohydrates including: Cakes, Sweet Desserts, Ice Cream, Soda (diet and  regular), Sweet Tea, Candies, Chips, Cookies, Store Bought Juices, Alcohol in Excess of  1-2 drinks a day, Lemonade,  Artificial Sweeteners, Doughnuts, Coffee Creamers, "Sugar-free" Products, etc, etc.  This is not a complete list.....  Exercise:  If you are able: 30 -60 minutes a day ,4 days a week, or 150 minutes a week.  The longer the better.  Combine stretch, strength, and aerobic activities.  If you were told in the past that you have high risk for cardiovascular diseases, you may seek evaluation by your heart doctor prior to initiating moderate to intense exercise programs.   Handouts Provided Include  KNow your numbers Lifestyle Medicine handouts  Learning Style & Readiness for Change Teaching method utilized: Visual & Auditory  Demonstrated degree of understanding via: Teach Back  Barriers to learning/adherence to lifestyle change: none Goals Established by Pt Goals  Choose 1 piece of fruit with each meal Eat 2 vegetables with lunch and dinner 5 days per week Cut out sugar free tea and focus only on water--5 bottles of water per day Increase walking in the house 15 minutes a day Check BS before each meal and using sliding scale of Humalog with meals Call MD to ask about prescription for CGM- Dexcom or Libre Get A1C down to 7%   MONITORING & EVALUATION Dietary intake, weekly physical activity, and blood sugars and weight in 1 month.  Recommend referral to Endocrinologist to assist with management of her DM.  Next Steps  Patient is to work on meal planning and meal prepping.[name] Kitchen

## 2023-02-17 NOTE — Patient Instructions (Signed)
Goals  Choose 1 piece of fruit with each meal Eat 2 vegetables with lunch and dinner 5 days per week Cut out sugar free tea and focus only on water--5 bottles of water per day Increase walking in the house 15 minutes a day Check BS before each meal and using sliding scale of Humalog with meals Call MD to ask about prescription for CGM- Dexcom or Libre Get A1C down to 7%

## 2023-02-18 ENCOUNTER — Ambulatory Visit: Payer: Medicare Other | Admitting: Internal Medicine

## 2023-02-18 VITALS — BP 107/66 | HR 102 | Temp 97.3°F | Ht 63.0 in | Wt 220.4 lb

## 2023-02-18 DIAGNOSIS — R7989 Other specified abnormal findings of blood chemistry: Secondary | ICD-10-CM

## 2023-02-18 MED ORDER — OMEPRAZOLE 40 MG PO CPDR: 40.0000 mg | DELAYED_RELEASE_CAPSULE | Freq: Every day | ORAL | 11 refills | Status: DC

## 2023-02-18 NOTE — Progress Notes (Unsigned)
Primary Care Physician:  Joeseph Amor Primary Gastroenterologist:  Dr. [name]  Pre-Procedure History & Physical: HPI:  Jamie [name] is a 52 y.o. female here for for follow-up of recent hospitalization for recurrent interstitial pancreatitis.  Uncomplicated.  Well-documented history of hypertriglyceridemia with triglycerides in the thousands from time to time TG level was in the 800 range during her recent hospitalization hospitalized at [facility] for 4 days.  CT at [facility] demonstrated acute interstitial pancreatitis. There is no alcohol exposure.  Gallbladder is long gone.  LFTs were normal except for slightly elevated alkaline phosphatase of 135.  No medications to implicate.  No family history of pancreatitis.  Patient on Crestor and fish oil.  She tells me she is seeing the endocrinologist for the first time next month.  Abdominal pain has improved but not resolved.  She has had no fever since hospitalization.  Fecal elastase previously normal in the 435 range.  No evidence of chronic pancreatitis on cross-sectional imaging.   Past Medical History:  Diagnosis Date   Anxiety    Arthritis    knees   Bipolar 1 disorder (HCC)    Chronic diarrhea    Diverticulosis of colon    GERD (gastroesophageal reflux disease)    Hiatal hernia    History of concussion    per pt 11/ 2020 w/ no residual   Hyperlipidemia    Hypertension    followed by pcp   (11-29-2019  per pt stated never had stress test)   MDD (major depressive disorder)    Migraine    neurologist--- dr Terrace Arabia   OSA on CPAP    followed by dr dohmeier   PCOS (polycystic ovarian syndrome)    PONV (postoperative nausea and vomiting)    Type 2 diabetes mellitus (HCC)    followed by pcp   (11-29-2019  pt stated checks blood sugar daily in am,  fasting sugar--- 96    Past Surgical History:  Procedure Laterality Date   ANAL FISSURE REPAIR  x3  last one  2007  approx.   BIOPSY  09/06/2019   Procedure: BIOPSY;   Surgeon: [name], MD;  Location: [facility];  Service: Endoscopy;;   COLONOSCOPY WITH PROPOFOL [name] 09/06/2019   pancolonic diverticulosis. Segmental biopsy completed with benign biopsies.    ESOPHAGOGASTRODUODENOSCOPY (EGD) WITH PROPOFOL [name] 06/16/2020   Dr. [name]: normal esophagus s/p dilation, normal stomach, duodenum.    ESOPHAGOGASTRODUODENOSCOPY (EGD) WITH PROPOFOL [name] 02/10/2022   Procedure: ESOPHAGOGASTRODUODENOSCOPY (EGD) WITH PROPOFOL;  Surgeon: [name], MD;  Location: [facility];  Service: Endoscopy;  Laterality: [name];  9:45am   EVALUATION UNDER ANESTHESIA WITH HEMORRHOIDECTOMY [name] 12/05/2019   Procedure: ANORECTAL EXAM UNDER ANESTHESIA WITH HEMORRHOIDECTOMY, HEMORRHOIDAL LIGATION/PEXY;  Surgeon: [name], MD;  Location: [facility];  Service: General;  Laterality: [name];  GENERAL AND LOCAL   EVALUATION UNDER ANESTHESIA WITH TEAR DUCT PROBING  infant   KNEE ARTHROSCOPY Right 1994   LAPAROSCOPIC CHOLECYSTECTOMY  08-06-2002  @[facility]    LUMBAR LAMINECTOMY/DECOMPRESSION MICRODISCECTOMY Right 01/22/2014   Procedure: LUMBAR FOUR TO FIVE LUMBAR LAMINECTOMY/DECOMPRESSION MICRODISCECTOMY 1 LEVEL;  Surgeon: Karn Cassis, MD;  Location: [facility];  Service: Neurosurgery;  Laterality: Right;  Right L45 diskectomy   MALONEY DILATION [name] 06/16/2020   Procedure: Elease [name] DILATION;  Surgeon: [name], MD;  Location: [facility];  Service: Endoscopy;  Laterality: [name];   MALONEY DILATION [name] 02/10/2022   Procedure: Elease [name] DILATION;  Surgeon: [name],  Gerrit Friends, MD;  Location: [facility];  Service: Endoscopy;  Laterality: [name];    Prior to Admission medications   Medication Sig Start Date End Date Taking? Authorizing Provider  ARIPiprazole (ABILIFY) 20 MG tablet Take 1 tablet (20 mg total) by mouth daily. 10/13/22  Yes Plovsky, Earvin Hansen, MD  buPROPion (WELLBUTRIN XL) 150 MG 24 hr tablet Take 3 tablets (450 mg total) by mouth daily. 10/13/22  Yes Plovsky, Earvin Hansen,  MD  clonazePAM (KLONOPIN) 1 MG tablet Take 1 mg in the morning and 2 mg at night 10/13/22  Yes Plovsky, Earvin Hansen, MD  DULoxetine (CYMBALTA) 60 MG capsule Take 2 capsules (120 mg total) by mouth at bedtime. 10/13/22  Yes Plovsky, Earvin Hansen, MD  fluticasone (FLONASE) 50 MCG/ACT nasal spray Place 1 spray into both nostrils at bedtime as needed for allergies or rhinitis.    Yes [provider]  Fluticasone-Umeclidin-Vilant (TRELEGY ELLIPTA) 100-62.5-25 MCG/ACT AEPB Inhale 1 puff into the lungs daily. 12/06/22  Yes Martina Sinner, MD  gabapentin (NEURONTIN) 300 MG capsule Take 600 mg by mouth at bedtime. 02/19/20  Yes [provider]  ibuprofen (ADVIL) 600 MG tablet Take 600 mg by mouth 3 (three) times daily. 12/13/22  Yes [provider]  insulin glargine (LANTUS) 100 UNIT/ML injection Inject 33 Units into the skin daily.   Yes [provider]  insulin lispro (HUMALOG) 100 UNIT/ML KwikPen Inject 10 Units into the skin 3 (three) times daily. 10/27/22  Yes [provider]  lisinopril (PRINIVIL,ZESTRIL) 5 MG tablet Take 5 mg by mouth daily.   Yes [provider]  metFORMIN (GLUCOPHAGE-XR) 500 MG 24 hr tablet Take 500 mg by mouth daily.  07/29/19  Yes [provider]  NON FORMULARY Pt uses a cpap nightly   Yes [provider]  ondansetron (ZOFRAN-ODT) 4 MG disintegrating tablet TAKE 1 TABLET BY MOUTH EVERY 8 HOURS AS NEEDED FOR NAUSEA AND VOMITING Patient taking differently: No sig reported 08/31/22  Yes Dawayne Ohair, Gerrit Friends, MD  Pancrelipase, Lip-Prot-Amyl, (ZENPEP) 40000-126000 units CPEP Take 2 capsules with meals and 1 capsule with snacks. 05/25/22  Yes Zayven Powe, Gerrit Friends, MD  rosuvastatin (CRESTOR) 20 MG tablet Take 20 mg by mouth daily.   Yes [provider]  traZODone (DESYREL) 100 MG tablet Take 4 tablets (400 mg total) by mouth at bedtime. 07/14/22  Yes Plovsky, Earvin Hansen, MD  omega-3 acid ethyl esters (LOVAZA) 1 g capsule Take 2 g by mouth  2 (two) times daily. 10/22/22   [provider]  omeprazole (PRILOSEC) 20 MG capsule Take 20 mg by mouth daily.    [provider]    Allergies as of 02/18/2023 - Review Complete 02/18/2023  Allergen Reaction Noted   Codeine Hives and Swelling 06/07/2011   Morphine and codeine Swelling and Rash 01/21/2014    Family History  Problem Relation Age of Onset   COPD Father    Anxiety disorder Maternal Grandmother    Depression Maternal Grandmother    Colon cancer Neg Hx    Colon polyps Neg Hx     Social History   Socioeconomic History   Marital status: Married    Spouse name: Not on file   Number of children: 0   Years of education: college   Highest education level: Not on file  Occupational History   Occupation: disabled  Tobacco Use   Smoking status: Never   Smokeless tobacco: Never  Vaping Use   Vaping status: Never Used  Substance and Sexual Activity  Alcohol use: No   Drug use: Never   Sexual activity: Yes    Partners: Male    Birth control/protection: None  Other Topics Concern   Not on file  Social History Narrative   Patient lives at home alone    Patient is right handed   Patient drinks soda's daily   Social Determinants of Health   Financial Resource Strain: Not on file  Food Insecurity: No Food Insecurity (01/27/2023)   Hunger Vital Sign    Worried About Running Out of Food in the Last Year: Never true    Ran Out of Food in the Last Year: Never true  Transportation Needs: No Transportation Needs (01/27/2023)   PRAPARE - Administrator, Civil Service (Medical): No    Lack of Transportation (Non-Medical): No  Physical Activity: Not on file  Stress: Not on file  Social Connections: Not on file  Intimate Partner Violence: Not At Risk (01/27/2023)   Humiliation, Afraid, Rape, and Kick questionnaire    Fear of Current or Ex-Partner: No    Emotionally Abused: No    Physically Abused: No    Sexually Abused: No    Review of  Systems: See HPI, otherwise negative ROS  Physical Exam: BP 107/66 (BP Location: Right Arm, Patient Position: Sitting, Cuff Size: Large)   Pulse (!) 102   Temp (!) 97.3 F (36.3 C) (Temporal)   Ht 5\' 3"  (1.6 m)   Wt 220 lb 6.4 oz (100 kg)   LMP  (LMP Unknown)   SpO2 97%   BMI 39.04 kg/m  General:   Alert,  well-nourished, pleasant and cooperative in NAD Lungs:  Clear throughout to auscultation.   No wheezes, crackles, or rhonchi. No acute distress. Heart:  Regular rate and rhythm; no murmurs, clicks, rubs,  or gallops. Abdomen: significantly obese.  Soft and nontender.  No obvious mass or organomegaly  Impression/Plan: 52 year old morbidly obese lady with recurrent interstitial pancreatitis.  Hospitalized on 2 occasions with mild flares which she "put up with" at home.  Markedly elevated triglycerides.  Apparently question has been raised regarding a familial triglyceridemia syndrome.  I am glad to see she is seeing the endocrinologist next month.  No other potential causes of pancreatitis found.  It is notable she underwent a pancreatic protocol CT earlier this year in between episodes and there is no evidence of tumor or other explanation for pancreatitis.  Recommendations:   It is critically important to get your triglyceride levels down to minimize the chances of recurrent pancreatitis.  I am glad you are seeing an endocrinologist.  Continue current dose of pancreatic enzymes.  Will increase her dose of omeprazole to 40 mg daily 30 minutes before breakfast (new prescription dispense 30 tablets with 11 refills)  Hepatic function profile.  Utilize acetaminophen and NSAIDs carefully for any abdominal pain your experiencing.  Office visit with [place] in 3 months  Notice: This dictation was prepared with Dragon dictation along with smaller phrase technology. Any transcriptional errors that result from this process are unintentional and may not be corrected upon review.

## 2023-02-18 NOTE — Patient Instructions (Signed)
It was good to see you again today!  Clinically, your pancreatitis has improved.  It is critically important to get your triglyceride levels down to minimize the chances of recurrent pancreatitis.  I am glad you are seeing an endocrinologist.  Continue current dose of pancreatic enzymes.  Will increase her dose of omeprazole to 40 mg daily 30 minutes before breakfast (new prescription dispense 30 tablets with 11 refills)  Hepatic function profile.  Utilize acetaminophen and NSAIDs carefully for any abdominal pain your experiencing.  Office visit with [place] in 3 months

## 2023-02-21 ENCOUNTER — Encounter: Payer: Self-pay | Admitting: Nutrition

## 2023-02-22 ENCOUNTER — Other Ambulatory Visit: Payer: Self-pay

## 2023-02-22 DIAGNOSIS — Z8719 Personal history of other diseases of the digestive system: Secondary | ICD-10-CM

## 2023-02-22 NOTE — Telephone Encounter (Signed)
Patient is scheduled for PFT tomorrow, 7/31 at 4pm in [place]- patient aware. Patient scheduled with Dr. Sherene Sires in [place] on 8/23- reminder mailed and informed to bring all meds. Nothing further needed.

## 2023-02-23 ENCOUNTER — Ambulatory Visit: Payer: Medicare Other | Admitting: Pulmonary Disease

## 2023-02-23 DIAGNOSIS — R942 Abnormal results of pulmonary function studies: Secondary | ICD-10-CM | POA: Diagnosis not present

## 2023-02-23 LAB — PULMONARY FUNCTION TEST
DL/VA % pred: 170 %
DL/VA: 7.39 ml/min/mmHg/L
DLCO cor % pred: 116 %
DLCO cor: 23.57 ml/min/mmHg
DLCO unc % pred: 107 %
DLCO unc: 21.81 ml/min/mmHg
FEF 25-75 Post: 3.02 L/sec
FEF 25-75 Pre: 2.14 L/sec
FEF2575-%Change-Post: 41 %
FEF2575-%Pred-Post: 113 %
FEF2575-%Pred-Pre: 80 %
FEV1-%Change-Post: 6 %
FEV1-%Pred-Post: 76 %
FEV1-%Pred-Pre: 71 %
FEV1-Post: 2.07 L
FEV1-Pre: 1.94 L
FEV1FVC-%Change-Post: 5 %
FEV1FVC-%Pred-Pre: 107 %
FEV6-%Change-Post: 1 %
FEV6-%Pred-Post: 69 %
FEV6-%Pred-Pre: 68 %
FEV6-Post: 2.29 L
FEV6-Pre: 2.25 L
FEV6FVC-%Change-Post: 0 %
FEV6FVC-%Pred-Post: 102 %
FEV6FVC-%Pred-Pre: 102 %
FVC-%Change-Post: 1 %
FVC-%Pred-Post: 67 %
FVC-%Pred-Pre: 66 %
FVC-Post: 2.29 L
FVC-Pre: 2.25 L
Post FEV1/FVC ratio: 90 %
Post FEV6/FVC ratio: 100 %
Pre FEV1/FVC ratio: 86 %
Pre FEV6/FVC Ratio: 100 %
RV % pred: 83 %
RV: 1.46 L
TLC % pred: 73 %
TLC: 3.62 L

## 2023-02-23 NOTE — Progress Notes (Signed)
PFT done today. 

## 2023-02-28 ENCOUNTER — Ambulatory Visit: Payer: Medicare Other | Admitting: Neurology

## 2023-02-28 DIAGNOSIS — K858 Other acute pancreatitis without necrosis or infection: Secondary | ICD-10-CM | POA: Diagnosis not present

## 2023-02-28 DIAGNOSIS — K859 Acute pancreatitis without necrosis or infection, unspecified: Secondary | ICD-10-CM | POA: Diagnosis not present

## 2023-03-01 NOTE — Patient Instructions (Signed)

## 2023-03-02 ENCOUNTER — Ambulatory Visit: Payer: Medicare Other | Admitting: Nurse Practitioner

## 2023-03-02 ENCOUNTER — Encounter: Payer: Self-pay | Admitting: Nurse Practitioner

## 2023-03-02 VITALS — BP 94/65 | HR 105 | Ht 63.0 in | Wt 219.6 lb

## 2023-03-02 DIAGNOSIS — E782 Mixed hyperlipidemia: Secondary | ICD-10-CM

## 2023-03-02 DIAGNOSIS — Z7984 Long term (current) use of oral hypoglycemic drugs: Secondary | ICD-10-CM

## 2023-03-02 DIAGNOSIS — I152 Hypertension secondary to endocrine disorders: Secondary | ICD-10-CM | POA: Diagnosis not present

## 2023-03-02 DIAGNOSIS — E1165 Type 2 diabetes mellitus with hyperglycemia: Secondary | ICD-10-CM | POA: Diagnosis not present

## 2023-03-02 DIAGNOSIS — Z794 Long term (current) use of insulin: Secondary | ICD-10-CM | POA: Diagnosis not present

## 2023-03-02 MED ORDER — METFORMIN HCL ER 500 MG PO TB24: 500.0000 mg | ORAL_TABLET | Freq: Every day | ORAL | 3 refills | Status: DC

## 2023-03-02 MED ORDER — LANTUS SOLOSTAR 100 UNIT/ML ~~LOC~~ SOPN: 30.0000 [IU] | PEN_INJECTOR | Freq: Every day | SUBCUTANEOUS | 3 refills | Status: DC

## 2023-03-02 MED ORDER — INSULIN LISPRO (1 UNIT DIAL) 100 UNIT/ML (KWIKPEN): 8.0000 [IU] | PEN_INJECTOR | Freq: Three times a day (TID) | SUBCUTANEOUS | 3 refills | Status: DC

## 2023-03-02 MED ORDER — CAREFINE PEN NEEDLES 31G X 6 MM MISC: 3 refills | Status: DC

## 2023-03-02 NOTE — Progress Notes (Signed)
Endocrinology Consult Note       03/02/2023, 2:00 PM   Subjective:    Patient ID: Jamie [name], female    DOB: 11-24-1970.  Jamie [name] is being seen in consultation for management of currently uncontrolled symptomatic diabetes requested by  Jamie Slates, PA-C.   Past Medical History:  Diagnosis Date   Anxiety    Arthritis    knees   Bipolar 1 disorder (HCC)    Chronic diarrhea    Diverticulosis of colon    GERD (gastroesophageal reflux disease)    Hiatal hernia    History of concussion    per pt 11/ 2020 w/ no residual   Hyperlipidemia    Hypertension    followed by pcp   (11-29-2019  per pt stated never had stress test)   MDD (major depressive disorder)    Migraine    neurologist--- dr Terrace Arabia   OSA on CPAP    followed by dr dohmeier   PCOS (polycystic ovarian syndrome)    PONV (postoperative nausea and vomiting)    Type 2 diabetes mellitus (HCC)    followed by pcp   (11-29-2019  pt stated checks blood sugar daily in am,  fasting sugar--- 96    Past Surgical History:  Procedure Laterality Date   ANAL FISSURE REPAIR  x3  last one  2007  approx.   BIOPSY  09/06/2019   Procedure: BIOPSY;  Surgeon: [name], MD;  Location: [facility];  Service: Endoscopy;;   COLONOSCOPY WITH PROPOFOL [name] 09/06/2019   pancolonic diverticulosis. Segmental biopsy completed with benign biopsies.    ESOPHAGOGASTRODUODENOSCOPY (EGD) WITH PROPOFOL [name] 06/16/2020   Dr. [name]: normal esophagus s/p dilation, normal stomach, duodenum.    ESOPHAGOGASTRODUODENOSCOPY (EGD) WITH PROPOFOL [name] 02/10/2022   Procedure: ESOPHAGOGASTRODUODENOSCOPY (EGD) WITH PROPOFOL;  Surgeon: [name], MD;  Location: [facility];  Service: Endoscopy;  Laterality: [name];  9:45am   EVALUATION UNDER ANESTHESIA WITH HEMORRHOIDECTOMY [name] 12/05/2019   Procedure: ANORECTAL EXAM UNDER ANESTHESIA WITH HEMORRHOIDECTOMY, HEMORRHOIDAL  LIGATION/PEXY;  Surgeon: [name], MD;  Location: [facility];  Service: General;  Laterality: [name];  GENERAL AND LOCAL   EVALUATION UNDER ANESTHESIA WITH TEAR DUCT PROBING  infant   KNEE ARTHROSCOPY Right 1994   LAPAROSCOPIC CHOLECYSTECTOMY  08-06-2002  @[facility]    LUMBAR LAMINECTOMY/DECOMPRESSION MICRODISCECTOMY Right 01/22/2014   Procedure: LUMBAR FOUR TO FIVE LUMBAR LAMINECTOMY/DECOMPRESSION MICRODISCECTOMY 1 LEVEL;  Surgeon: Karn Cassis, MD;  Location: [facility];  Service: Neurosurgery;  Laterality: Right;  Right L45 diskectomy   MALONEY DILATION [name] 06/16/2020   Procedure: Elease [name] DILATION;  Surgeon: [name], MD;  Location: [facility];  Service: Endoscopy;  Laterality: [name];   MALONEY DILATION [name] 02/10/2022   Procedure: Elease [name] DILATION;  Surgeon: [name], MD;  Location: [facility];  Service: Endoscopy;  Laterality: [name];    Social History   Socioeconomic History   Marital status: Married    Spouse name: Not on file   Number of children: 0   Years of education: college   Highest education level: Not on file  Occupational History   Occupation: disabled  Tobacco Use  Smoking status: Never   Smokeless tobacco: Never  Vaping Use   Vaping status: Never Used  Substance and Sexual Activity   Alcohol use: No   Drug use: Never   Sexual activity: Yes    Partners: Male    Birth control/protection: None  Other Topics Concern   Not on file  Social History Narrative   Patient lives at home alone    Patient is right handed   Patient drinks soda's daily   Social Determinants of Health   Financial Resource Strain: Not on file  Food Insecurity: No Food Insecurity (01/27/2023)   Hunger Vital Sign    Worried About Running Out of Food in the Last Year: Never true    Ran Out of Food in the Last Year: Never true  Transportation Needs: No Transportation Needs (01/27/2023)   PRAPARE - Administrator, Civil Service (Medical): No    Lack of  Transportation (Non-Medical): No  Physical Activity: Not on file  Stress: Not on file  Social Connections: Not on file    Family History  Problem Relation Age of Onset   COPD Father    Anxiety disorder Maternal Grandmother    Depression Maternal Grandmother    Colon cancer Neg Hx    Colon polyps Neg Hx     Outpatient Encounter Medications as of 03/02/2023  Medication Sig   ARIPiprazole (ABILIFY) 20 MG tablet Take 1 tablet (20 mg total) by mouth daily.   buPROPion (WELLBUTRIN XL) 150 MG 24 hr tablet Take 3 tablets (450 mg total) by mouth daily.   clonazePAM (KLONOPIN) 1 MG tablet Take 1 mg in the morning and 2 mg at night   DULoxetine (CYMBALTA) 60 MG capsule Take 2 capsules (120 mg total) by mouth at bedtime.   fluticasone (FLONASE) 50 MCG/ACT nasal spray Place 1 spray into both nostrils at bedtime as needed for allergies or rhinitis.    Fluticasone-Umeclidin-Vilant (TRELEGY ELLIPTA) 100-62.5-25 MCG/ACT AEPB Inhale 1 puff into the lungs daily.   gabapentin (NEURONTIN) 300 MG capsule Take 600 mg by mouth at bedtime.   ibuprofen (ADVIL) 600 MG tablet Take 600 mg by mouth 3 (three) times daily.   insulin glargine (LANTUS SOLOSTAR) 100 UNIT/ML Solostar Pen Inject 30 Units into the skin at bedtime.   Insulin Pen Needle (CAREFINE PEN NEEDLES) 31G X 6 MM MISC Use to inject insulin 4 times daily   lisinopril (PRINIVIL,ZESTRIL) 5 MG tablet Take 5 mg by mouth daily.   NON FORMULARY Pt uses a cpap nightly   omega-3 acid ethyl esters (LOVAZA) 1 g capsule Take 2 g by mouth 2 (two) times daily.   omeprazole (PRILOSEC) 40 MG capsule Take 1 capsule (40 mg total) by mouth daily.   ondansetron (ZOFRAN-ODT) 4 MG disintegrating tablet TAKE 1 TABLET BY MOUTH EVERY 8 HOURS AS NEEDED FOR NAUSEA AND VOMITING (Patient taking differently: No sig reported)   Pancrelipase, Lip-Prot-Amyl, (ZENPEP) 40000-126000 units CPEP Take 2 capsules with meals and 1 capsule with snacks.   rosuvastatin (CRESTOR) 20 MG tablet  Take 20 mg by mouth daily.   traZODone (DESYREL) 100 MG tablet Take 4 tablets (400 mg total) by mouth at bedtime.   [DISCONTINUED] dapagliflozin propanediol (FARXIGA) 10 MG TABS tablet Take 10 mg by mouth daily.   [DISCONTINUED] insulin glargine (LANTUS) 100 UNIT/ML injection Inject 30 Units into the skin at bedtime.   [DISCONTINUED] insulin lispro (HUMALOG) 100 UNIT/ML KwikPen Inject 8-14 Units into the skin 3 (three) times daily.   [  DISCONTINUED] metFORMIN (GLUCOPHAGE-XR) 500 MG 24 hr tablet Take 500 mg by mouth daily.    insulin lispro (HUMALOG) 100 UNIT/ML KwikPen Inject 8-14 Units into the skin 3 (three) times daily.   metFORMIN (GLUCOPHAGE-XR) 500 MG 24 hr tablet Take 1 tablet (500 mg total) by mouth daily with breakfast.   No facility-administered encounter medications on file as of 03/02/2023.    ALLERGIES: Allergies  Allergen Reactions   Codeine Hives and Swelling   Ozempic (0.25 Or 0.5 Mg-Dose) [Semaglutide(0.25 Or 0.5mg -Dos)]     pancreatitis   Morphine And Codeine Swelling and Rash    VACCINATION STATUS: Immunization History  Administered Date(s) Administered   Influenza Inj Mdck Quad Pf 04/26/2019   Moderna Sars-Covid-2 Vaccination 11/13/2019, 12/17/2019    Diabetes She presents for her initial diabetic visit. She has type 2 diabetes mellitus. Onset time: diagnosed at approx age of 10. Her disease course has been improving. There are no hypoglycemic associated symptoms. Associated symptoms include fatigue, foot paresthesias, polydipsia and polyuria. There are no hypoglycemic complications. Diabetic complications include peripheral neuropathy. (Hx pancreatitis) Risk factors for coronary artery disease include diabetes mellitus, dyslipidemia, family history, obesity, hypertension and sedentary lifestyle. Current diabetic treatment includes intensive insulin program and oral agent (dual therapy) (Metformin and Farxiga). She is compliant with treatment most of the time. Her weight  is decreasing steadily. She is following a generally healthy diet. Meal planning includes avoidance of concentrated sweets and ADA exchanges. She has not had a previous visit with a dietitian. She participates in exercise intermittently. Her overall blood glucose range is 180-200 mg/dl. (She presents today for her consultation with her logs showing slightly above target glycemic profile overall.  Her most recent A1c on 5/15 was 10.1%.  She sees Norm Salt, RDE for nutrition information here.  She checks glucose 4 times daily.  She drinks mostly water, eats 3 meals per day (sometimes struggles to eat lunch), does not snack.  She does engage in chair exercises as she has had multiple back issues limiting her mobility in the past.  She is due to see eye doctor next month, sees podiatry routinely.) An ACE inhibitor/angiotensin II receptor blocker is being taken. She does not see a podiatrist.Eye exam is current.     Review of systems  Constitutional: + Minimally fluctuating body weight, current Body mass index is 38.9 kg/m., no fatigue, no subjective hyperthermia, no subjective hypothermia Eyes: no blurry vision, no xerophthalmia ENT: no sore throat, no nodules palpated in throat, no dysphagia/odynophagia, no hoarseness Cardiovascular: no chest pain, no shortness of breath, no palpitations, no leg swelling Respiratory: no cough, no shortness of breath Gastrointestinal: no nausea/vomiting/diarrhea Musculoskeletal: no muscle/joint aches Skin: no rashes, no hyperemia Neurological: no tremors, no numbness, no tingling, no dizziness Psychiatric: no depression, no anxiety  Objective:     BP 94/65 (BP Location: Right Arm, Patient Position: Sitting, Cuff Size: Large)   Pulse (!) 105   Ht 5\' 3"  (1.6 m)   Wt 219 lb 9.6 oz (99.6 kg)   LMP  (LMP Unknown)   BMI 38.90 kg/m   Wt Readings from Last 3 Encounters:  03/02/23 219 lb 9.6 oz (99.6 kg)  02/18/23 220 lb 6.4 oz (100 kg)  02/17/23 219 lb (99.3  kg)     BP Readings from Last 3 Encounters:  03/02/23 94/65  02/18/23 107/66  01/29/23 97/68     Physical Exam- Limited  Constitutional:  Body mass index is 38.9 kg/m. , not in acute distress, normal state  of mind Eyes:  EOMI, no exophthalmos Neck: Supple Cardiovascular: mildly tachycardic, no murmurs, rubs, or gallops, no edema Respiratory: Adequate breathing efforts, no crackles, rales, rhonchi, or wheezing Musculoskeletal: no gross deformities, strength intact in all four extremities, no gross restriction of joint movements Skin:  no rashes, no hyperemia Neurological: no tremor with outstretched hands   Diabetic Foot Exam - Simple   No data filed     CMP ( most recent) CMP     Component Value Date/Time   NA 134 (L) 01/29/2023 0448   NA 138 09/16/2011 1753   K 3.9 01/29/2023 0448   K 3.9 09/16/2011 1753   CL 101 01/29/2023 0448   CL 103 09/16/2011 1753   CO2 25 01/29/2023 0448   CO2 23 09/16/2011 1753   GLUCOSE 161 (H) 01/29/2023 0448   GLUCOSE 93 09/16/2011 1753   BUN 13 01/29/2023 0448   BUN 14 09/16/2011 1753   CREATININE 0.74 01/29/2023 0448   CREATININE 1.17 (H) 06/03/2022 0812   CALCIUM 7.9 (L) 01/29/2023 0448   CALCIUM 9.1 09/16/2011 1753   PROT 7.1 02/18/2023 1412   PROT 7.5 09/16/2011 1753   ALBUMIN 2.6 (L) 01/29/2023 0448   ALBUMIN 4.1 09/16/2011 1753   AST 18 02/18/2023 1412   AST 17 09/16/2011 1753   ALT 19 02/18/2023 1412   ALT 23 09/16/2011 1753   ALKPHOS 132 (H) 01/29/2023 0448   ALKPHOS 56 09/16/2011 1753   BILITOT 0.4 02/18/2023 1412   BILITOT 0.2 09/16/2011 1753   GFRNONAA >60 01/29/2023 0448   GFRNONAA >60 09/16/2011 1753     Diabetic Labs (most recent): Lab Results  Component Value Date   HGBA1C 10.1 (H) 01/26/2023   HGBA1C 6.2 (H) 05/19/2018     Lipid Panel ( most recent) Lipid Panel     Component Value Date/Time   CHOL 249 (H) 01/27/2023 0851   TRIG 779 (H) 01/29/2023 0448   HDL 24 (L) 01/27/2023 0851   CHOLHDL 10.4  01/27/2023 0851   VLDL UNABLE TO CALCULATE IF TRIGLYCERIDE OVER 400 mg/dL 84/69/6295 2841   LDLCALC UNABLE TO CALCULATE IF TRIGLYCERIDE OVER 400 mg/dL 32/44/0102 7253   LDLCALC  06/03/2022 0812     Comment:     . LDL cholesterol not calculated. Triglyceride levels greater than 400 mg/dL invalidate calculated LDL results. . Reference range: <100 . Desirable range <100 mg/dL for primary prevention;   <70 mg/dL for patients with CHD or diabetic patients  with > or = 2 CHD risk factors. [name] Kitchen LDL-C is now calculated using the Martin-Hopkins  calculation, which is a validated novel method providing  better accuracy than the Friedewald equation in the  estimation of LDL-C.  Horald Pollen et al. Lenox Ahr. 6644;034(74): 2061-2068  (http://education.[facility].com/faq/FAQ164)    LDLDIRECT 39 01/27/2023 0851      Lab Results  Component Value Date   TSH 4.080 05/19/2018   TSH 2.01 09/16/2011           Assessment & Plan:   1) Type 2 diabetes mellitus with hyperglycemia, with long-term current use of insulin (HCC)  She presents today for her consultation with her logs showing slightly above target glycemic profile overall.  Her most recent A1c on 5/15 was 10.1%.  She sees Norm Salt, RDE for nutrition information here.  She checks glucose 4 times daily.  She drinks mostly water, eats 3 meals per day (sometimes struggles to eat lunch), does not snack.  She does engage in chair exercises as she has had  multiple back issues limiting her mobility in the past.  She is due to see eye doctor next month, sees podiatry routinely.  - Jamie [name] has currently uncontrolled symptomatic type 2 DM since 52 years of age, with most recent A1c of 10.1 %.   -Recent labs reviewed.  - I had a long discussion with her about the progressive nature of diabetes and the pathology behind its complications. -her diabetes is complicated by neuropathy and chronic pancreatitis and she remains at a high risk  for more acute and chronic complications which include CAD, CVA, CKD, retinopathy, and neuropathy. These are all discussed in detail with her.  The following Lifestyle Medicine recommendations according to American College of Lifestyle Medicine [facility]) were discussed and offered to patient and she agrees to start the journey:  A. Whole Foods, Plant-based plate comprising of fruits and vegetables, plant-based proteins, whole-grain carbohydrates was discussed in detail with the patient.   A list for source of those nutrients were also provided to the patient.  Patient will use only water or unsweetened tea for hydration. B.  The need to stay away from risky substances including alcohol, smoking; obtaining 7 to 9 hours of restorative sleep, at least 150 minutes of moderate intensity exercise weekly, the importance of healthy social connections,  and stress reduction techniques were discussed. C.  A full color page of  Calorie density of various food groups per pound showing examples of each food groups was provided to the patient.  - I have counseled her on diet and weight management by adopting a carbohydrate restricted/protein rich diet. Patient is encouraged to switch to unprocessed or minimally processed complex starch and increased protein intake (animal or plant source), fruits, and vegetables. -  she is advised to stick to a routine mealtimes to eat 3 meals a day and avoid unnecessary snacks (to snack only to correct hypoglycemia).   - she acknowledges that there is a room for improvement in her food and drink choices. - Suggestion is made for her to avoid simple carbohydrates from her diet including Cakes, Sweet Desserts, Ice Cream, Soda (diet and regular), Sweet Tea, Candies, Chips, Cookies, Store Bought Juices, Alcohol in Excess of 1-2 drinks a day, Artificial Sweeteners, Coffee Creamer, and "Sugar-free" Products. This will help patient to have more stable blood glucose profile and potentially avoid  unintended weight gain.  - she has already seen Norm Salt, RDN, CDE for diabetes education.  - I have approached her with the following individualized plan to manage her diabetes and patient agrees:   -She is advised to adjust her Lantus to 30 units SQ nightly and adjust her Humalog to 8-14 units TID with meals if glucose is above 90 and she is eating (Specific instructions on how to titrate insulin dosage based on glucose readings given to patient in writing).  She demonstrated her ability to properly use the SSI chart with me today.  -she is encouraged to continue monitoring glucose 4 times daily, before meals and before bed, to log their readings on the clinic sheets provided, and bring them to review at follow up appointment in 3 months.  Given treatment with MDI, she could certainly benefit from CGM use.  I will send in for Dexcom G7 to [employer] for fulfillment.   - she is warned not to take insulin without proper monitoring per orders. - Adjustment parameters are given to her for hypo and hyperglycemia in writing. - she is encouraged to call clinic for blood glucose  levels less than 70 or above 300 mg /dl. - she is advised to continue Metformin 500 mg ER daily after breakfast, therapeutically suitable for patient . - her Marcelline Deist will be discontinued, risk outweighs benefit for this patient- hx of UTI and yeast infections, and yeast on groin.  - she is NOT a good candidate for GLP1 therapy given hx of chronic pancreatitis and significantly elevated triglycerides.   - Specific targets for  A1c; LDL, HDL, and Triglycerides were discussed with the patient.  2) Blood Pressure /Hypertension:  her blood pressure is controlled to target.   she is advised to continue her current medications including Lisinopril 5 mg p.o. daily with breakfast.  3) Lipids/Hyperlipidemia:    Review of her recent lipid panel from 02/18/23 showed significantly elevated triglycerides of 779, LDL unable to be  calculated.  she is advised to continue Crestor 20 mg daily at bedtime.  Side effects and precautions discussed with her.  4)  Weight/Diet:  her Body mass index is 38.9 kg/m.  -  clearly complicating her diabetes care.   she is a candidate for weight loss. I discussed with her the fact that loss of 5 - 10% of her  current body weight will have the most impact on her diabetes management.  Exercise, and detailed carbohydrates information provided  -  detailed on discharge instructions.  5) Chronic Care/Health Maintenance: -she is on ACEI/ARB and Statin medications and is encouraged to initiate and continue to follow up with Ophthalmology, Dentist, Podiatrist at least yearly or according to recommendations, and advised to stay away from smoking. I have recommended yearly flu vaccine and pneumonia vaccine at least every 5 years; moderate intensity exercise for up to 150 minutes weekly; and sleep for at least 7 hours a day.  - she is advised to maintain close follow up with Jamie Slates, PA-C for primary care needs, as well as her other providers for optimal and coordinated care.   - Time spent in this patient care: 60 min, of which > 50% was spent in counseling her about her diabetes and the rest reviewing her blood glucose logs, discussing her hypoglycemia and hyperglycemia episodes, reviewing her current and previous labs/studies (including abstraction from other facilities) and medications doses and developing a long term treatment plan based on the latest standards of care/guidelines; and documenting her care.    Please refer to Patient Instructions for Blood Glucose Monitoring and Insulin/Medications Dosing Guide" in media tab for additional information. Please also refer to "Patient Self Inventory" in the Media tab for reviewed elements of pertinent patient history.  [name] participated in the discussions, expressed understanding, and voiced agreement with the above plans.  All questions  were answered to her satisfaction. she is encouraged to contact clinic should she have any questions or concerns prior to her return visit.     Follow up plan: - Return in about 3 months (around 06/02/2023) for Diabetes F/U with A1c in office, No previsit labs, Bring meter and logs.    Ronny Bacon, [facility] [address] [place], [place] [address] Phone: [phone] Fax: [phone]  03/02/2023, 2:00 PM

## 2023-03-03 DIAGNOSIS — E1165 Type 2 diabetes mellitus with hyperglycemia: Secondary | ICD-10-CM | POA: Diagnosis not present

## 2023-03-08 ENCOUNTER — Ambulatory Visit (HOSPITAL_COMMUNITY): Payer: Medicare Other | Admitting: Psychiatry

## 2023-03-08 DIAGNOSIS — F323 Major depressive disorder, single episode, severe with psychotic features: Secondary | ICD-10-CM | POA: Diagnosis not present

## 2023-03-08 DIAGNOSIS — F325 Major depressive disorder, single episode, in full remission: Secondary | ICD-10-CM

## 2023-03-08 MED ORDER — DULOXETINE HCL 60 MG PO CPEP: 120.0000 mg | ORAL_CAPSULE | Freq: Every day | ORAL | 4 refills | Status: DC

## 2023-03-08 MED ORDER — TRAZODONE HCL 100 MG PO TABS: 400.0000 mg | ORAL_TABLET | Freq: Every day | ORAL | 1 refills | Status: DC

## 2023-03-08 MED ORDER — ARIPIPRAZOLE 20 MG PO TABS: 20.0000 mg | ORAL_TABLET | Freq: Every day | ORAL | 1 refills | Status: DC

## 2023-03-08 MED ORDER — BUPROPION HCL ER (XL) 150 MG PO TB24: 450.0000 mg | ORAL_TABLET | Freq: Every day | ORAL | 5 refills | Status: DC

## 2023-03-08 MED ORDER — CLONAZEPAM 1 MG PO TABS: ORAL_TABLET | ORAL | 4 refills | Status: DC

## 2023-03-08 NOTE — Progress Notes (Signed)
Patient ID: Jamie [name], female   DOB: June 22, 1971, 52 y.o.   MRN: [id number] [facility] MD Progress Note  03/08/2023 3:21 PM Jamie [name]  MRN:  [id number] Subjective:  Feeling well. Principal Problem: Major depression recurrent   Today patient seems to be at her baseline.  She is a little nervous as she just took a pulmonary function test and apparently it was not good.  She is going to be seeing a pulmonologist soon.  She denies any cough or shortness of breath.  She is having some sinus congestion but she says that is pretty probably chronic for her.  Her mood is good.  She shows no evidence of psychosis.  She is sleeping okay and eating well but she has lost a little bit of weight.  Her husband Jamie [name] is doing okay.  The patient's issue with pancreatitis has resolved.  The patient does have diabetes and I do not think it is well controlled.  The patient's only complaint is a somewhat reduction in her memory.  The patient is very compliant with her medicines.  Her anxiety is well-controlled.  She denies daily depression.  Her ability to enjoy things seem to be slightly reduced.  Patient shows no evidence of tardive dyskinesia. Virtual Visit via Telephone Note  I connected with Jamie [name] on 05/13/2021 at  3:00 PM EDT by telephone and verified that I am speaking with the correct person using two identifiers.  Location: Patient: home Provider: office   I discussed the limitations, risks, security and privacy concerns of performing an evaluation and management service by telephone and the availability of in person appointments. I also discussed with the patient that there may be a patient responsible charge related to this service. The patient expressed understanding and agreed to proceed.    I discussed the assessment and treatment plan with the patient. The patient was provided an opportunity to ask questions and all were answered. The patient agreed with the plan and demonstrated an  understanding of the instructions.   The patient was advised to call back or seek an in-person evaluation if the symptoms worsen or if the condition fails to improve as anticipated.  I provided 30 minutes of non-face-to-face time during this encounter.   [name], MD   Past Medical History:  Diagnosis Date   Anxiety    Arthritis    knees   Bipolar 1 disorder (HCC)    Chronic diarrhea    Diverticulosis of colon    GERD (gastroesophageal reflux disease)    Hiatal hernia    History of concussion    per pt 11/ 2020 w/ no residual   Hyperlipidemia    Hypertension    followed by pcp   (11-29-2019  per pt stated never had stress test)   MDD (major depressive disorder)    Migraine    neurologist--- dr Terrace Arabia   OSA on CPAP    followed by dr dohmeier   PCOS (polycystic ovarian syndrome)    PONV (postoperative nausea and vomiting)    Type 2 diabetes mellitus (HCC)    followed by pcp   (11-29-2019  pt stated checks blood sugar daily in am,  fasting sugar--- 96    Past Surgical History:  Procedure Laterality Date   ANAL FISSURE REPAIR  x3  last one  2007  approx.   BIOPSY  09/06/2019   Procedure: BIOPSY;  Surgeon: [name], MD;  Location: [facility];  Service: Endoscopy;;  COLONOSCOPY WITH PROPOFOL [name] 09/06/2019   pancolonic diverticulosis. Segmental biopsy completed with benign biopsies.    ESOPHAGOGASTRODUODENOSCOPY (EGD) WITH PROPOFOL [name] 06/16/2020   Dr. [name]: normal esophagus s/p dilation, normal stomach, duodenum.    ESOPHAGOGASTRODUODENOSCOPY (EGD) WITH PROPOFOL [name] 02/10/2022   Procedure: ESOPHAGOGASTRODUODENOSCOPY (EGD) WITH PROPOFOL;  Surgeon: [name], MD;  Location: [facility];  Service: Endoscopy;  Laterality: [name];  9:45am   EVALUATION UNDER ANESTHESIA WITH HEMORRHOIDECTOMY [name] 12/05/2019   Procedure: ANORECTAL EXAM UNDER ANESTHESIA WITH HEMORRHOIDECTOMY, HEMORRHOIDAL LIGATION/PEXY;  Surgeon: [name], MD;  Location: [facility];  Service: General;  Laterality: [name];  GENERAL AND LOCAL   EVALUATION UNDER ANESTHESIA WITH TEAR DUCT PROBING  infant   KNEE ARTHROSCOPY Right 1994   LAPAROSCOPIC CHOLECYSTECTOMY  08-06-2002  @[facility]    LUMBAR LAMINECTOMY/DECOMPRESSION MICRODISCECTOMY Right 01/22/2014   Procedure: LUMBAR FOUR TO FIVE LUMBAR LAMINECTOMY/DECOMPRESSION MICRODISCECTOMY 1 LEVEL;  Surgeon: Karn Cassis, MD;  Location: [facility];  Service: Neurosurgery;  Laterality: Right;  Right L45 diskectomy   MALONEY DILATION [name] 06/16/2020   Procedure: Elease [name] DILATION;  Surgeon: [name], MD;  Location: [facility];  Service: Endoscopy;  Laterality: [name];   MALONEY DILATION [name] 02/10/2022   Procedure: Elease [name] DILATION;  Surgeon: [name], MD;  Location: [facility];  Service: Endoscopy;  Laterality: [name];   Family History:  Family History  Problem Relation Age of Onset   COPD Father    Anxiety disorder Maternal Grandmother    Depression Maternal Grandmother    Colon cancer Neg Hx    Colon polyps Neg Hx    Family Psychiatric  History:  Social History:  Social History   Substance and Sexual Activity  Alcohol Use No     Social History   Substance and Sexual Activity  Drug Use Never    Social History   Socioeconomic History   Marital status: Married    Spouse name: Not on file   Number of children: 0   Years of education: college   Highest education level: Not on file  Occupational History   Occupation: disabled  Tobacco Use   Smoking status: Never   Smokeless tobacco: Never  Vaping Use   Vaping status: Never Used  Substance and Sexual Activity   Alcohol use: No   Drug use: Never   Sexual activity: Yes    Partners: Male    Birth control/protection: None  Other Topics Concern   Not on file  Social History Narrative   Patient lives at home alone    Patient is right handed   Patient drinks soda's daily   Social Determinants of Health   Financial Resource Strain: Not on file   Food Insecurity: No Food Insecurity (01/27/2023)   Hunger Vital Sign    Worried About Running Out of Food in the Last Year: Never true    Ran Out of Food in the Last Year: Never true  Transportation Needs: No Transportation Needs (01/27/2023)   PRAPARE - Administrator, Civil Service (Medical): No    Lack of Transportation (Non-Medical): No  Physical Activity: Not on file  Stress: Not on file  Social Connections: Not on file   Additional Social History:                         Sleep: Fair  Appetite:  Good  Current Medications: Current Outpatient Medications  Medication Sig Dispense Refill   ARIPiprazole (  ABILIFY) 20 MG tablet Take 1 tablet (20 mg total) by mouth daily. 90 tablet 1   buPROPion (WELLBUTRIN XL) 150 MG 24 hr tablet Take 3 tablets (450 mg total) by mouth daily. 90 tablet 5   clonazePAM (KLONOPIN) 1 MG tablet Take 1 mg in the morning and 2 mg at night 90 tablet 4   DULoxetine (CYMBALTA) 60 MG capsule Take 2 capsules (120 mg total) by mouth at bedtime. 60 capsule 4   fluticasone (FLONASE) 50 MCG/ACT nasal spray Place 1 spray into both nostrils at bedtime as needed for allergies or rhinitis.      Fluticasone-Umeclidin-Vilant (TRELEGY ELLIPTA) 100-62.5-25 MCG/ACT AEPB Inhale 1 puff into the lungs daily. 28 each 5   gabapentin (NEURONTIN) 300 MG capsule Take 600 mg by mouth at bedtime.     ibuprofen (ADVIL) 600 MG tablet Take 600 mg by mouth 3 (three) times daily.     insulin glargine (LANTUS SOLOSTAR) 100 UNIT/ML Solostar Pen Inject 30 Units into the skin at bedtime. 30 mL 3   insulin lispro (HUMALOG) 100 UNIT/ML KwikPen Inject 8-14 Units into the skin 3 (three) times daily. 36 mL 3   Insulin Pen Needle (CAREFINE PEN NEEDLES) 31G X 6 MM MISC Use to inject insulin 4 times daily 300 each 3   lisinopril (PRINIVIL,ZESTRIL) 5 MG tablet Take 5 mg by mouth daily.     metFORMIN (GLUCOPHAGE-XR) 500 MG 24 hr tablet Take 1 tablet (500 mg total) by mouth daily with  breakfast. 90 tablet 3   NON FORMULARY Pt uses a cpap nightly     omega-3 acid ethyl esters (LOVAZA) 1 g capsule Take 2 g by mouth 2 (two) times daily.     omeprazole (PRILOSEC) 40 MG capsule Take 1 capsule (40 mg total) by mouth daily. 30 capsule 11   ondansetron (ZOFRAN-ODT) 4 MG disintegrating tablet TAKE 1 TABLET BY MOUTH EVERY 8 HOURS AS NEEDED FOR NAUSEA AND VOMITING (Patient taking differently: No sig reported) 90 tablet 3   Pancrelipase, Lip-Prot-Amyl, (ZENPEP) 40000-126000 units CPEP Take 2 capsules with meals and 1 capsule with snacks. 270 capsule 3   rosuvastatin (CRESTOR) 20 MG tablet Take 20 mg by mouth daily.     traZODone (DESYREL) 100 MG tablet Take 4 tablets (400 mg total) by mouth at bedtime. 360 tablet 1   No current facility-administered medications for this visit.    Lab Results: No results found for this or any previous visit (from the past 48 hour(s)).  Physical Findings: AIMS:  , ,  ,  ,    CIWA:    COWS:     Musculoskeletal: Strength & Muscle Tone: within normal limits Gait & Station: normal Patient leans: Right  Psychiatric Specialty Exam: ROS  There were no vitals taken for this visit.There is no height or weight on file to calculate BMI.  General Appearance: Casual  Eye Contact::  Good  Speech:  Clear and Coherent  Volume:  Normal  Mood:Depressed  Affect:  Appropriate  Today the patient is doing very well. Thought Process:  Coherent  Orientation:  Full (Time, Place, and Person)  Thought Content:  NA and WDL  Suicidal Thoughts:  yes  Homicidal Thoughts:  No  Memory:  Immediate;   NA  Judgement:  Good  Insight:  Good  Psychomotor Activity:  NA  Concentration:  Good  Recall:  Good  Fund of Knowledge:Good  Language: Good  Akathisia:  No  Handed:  Right  AIMS (if indicated):  Assets:  Desire for Improvement  ADL's:  Intact  Cognition: WNL  Sleep:      Treatment Plan Summary: 03/08/2023, 3:21 PM This patient has diagnosis is major  depression with psychosis.  At this time she has no overt evidence of psychosis.  She continues taking Wellbutrin 450 Cymbalta 120 mg and 20 mg of Abilify.  Her second problem is adjustment disorder with an anxious mood state.  She continues taking Klonopin 1 mg in the morning and 2 mg at night.  She has been on this dosage for many years.  I believe the patient is stable at her baseline.  She will return to see me in 2 to 3 months.  The patient also carries a diagnosis of insomnia and is treated with trazodone.

## 2023-03-17 NOTE — Progress Notes (Deleted)
[name], female    DOB: February 09, 1971   MRN: [id number]   Brief patient profile:  79  *** self referred to pulmonary clinic 03/18/2023 - had been seeing Dr Francine Graven       History of Present Illness  03/18/2023  Pulmonary/ 1st office eval/Daziya Redmond  No chief complaint on file.    Dyspnea:  *** Cough: *** Sleep: *** SABA use:     Outpatient Medications Prior to Visit  Medication Sig Dispense Refill   ARIPiprazole (ABILIFY) 20 MG tablet Take 1 tablet (20 mg total) by mouth daily. 90 tablet 1   buPROPion (WELLBUTRIN XL) 150 MG 24 hr tablet Take 3 tablets (450 mg total) by mouth daily. 90 tablet 5   clonazePAM (KLONOPIN) 1 MG tablet Take 1 mg in the morning and 2 mg at night 90 tablet 4   DULoxetine (CYMBALTA) 60 MG capsule Take 2 capsules (120 mg total) by mouth at bedtime. 60 capsule 4   fluticasone (FLONASE) 50 MCG/ACT nasal spray Place 1 spray into both nostrils at bedtime as needed for allergies or rhinitis.      Fluticasone-Umeclidin-Vilant (TRELEGY ELLIPTA) 100-62.5-25 MCG/ACT AEPB Inhale 1 puff into the lungs daily. 28 each 5   gabapentin (NEURONTIN) 300 MG capsule Take 600 mg by mouth at bedtime.     ibuprofen (ADVIL) 600 MG tablet Take 600 mg by mouth 3 (three) times daily.     insulin glargine (LANTUS SOLOSTAR) 100 UNIT/ML Solostar Pen Inject 30 Units into the skin at bedtime. 30 mL 3   insulin lispro (HUMALOG) 100 UNIT/ML KwikPen Inject 8-14 Units into the skin 3 (three) times daily. 36 mL 3   Insulin Pen Needle (CAREFINE PEN NEEDLES) 31G X 6 MM MISC Use to inject insulin 4 times daily 300 each 3   lisinopril (PRINIVIL,ZESTRIL) 5 MG tablet Take 5 mg by mouth daily.     metFORMIN (GLUCOPHAGE-XR) 500 MG 24 hr tablet Take 1 tablet (500 mg total) by mouth daily with breakfast. 90 tablet 3   NON FORMULARY Pt uses a cpap nightly     omega-3 acid ethyl esters (LOVAZA) 1 g capsule Take 2 g by mouth 2 (two) times daily.     omeprazole (PRILOSEC) 40 MG capsule Take 1 capsule (40 mg  total) by mouth daily. 30 capsule 11   ondansetron (ZOFRAN-ODT) 4 MG disintegrating tablet TAKE 1 TABLET BY MOUTH EVERY 8 HOURS AS NEEDED FOR NAUSEA AND VOMITING (Patient taking differently: No sig reported) 90 tablet 3   Pancrelipase, Lip-Prot-Amyl, (ZENPEP) 40000-126000 units CPEP Take 2 capsules with meals and 1 capsule with snacks. 270 capsule 3   rosuvastatin (CRESTOR) 20 MG tablet Take 20 mg by mouth daily.     traZODone (DESYREL) 100 MG tablet Take 4 tablets (400 mg total) by mouth at bedtime. 360 tablet 1   No facility-administered medications prior to visit.    Past Medical History:  Diagnosis Date   Anxiety    Arthritis    knees   Bipolar 1 disorder (HCC)    Chronic diarrhea    Diverticulosis of colon    GERD (gastroesophageal reflux disease)    Hiatal hernia    History of concussion    per pt 11/ 2020 w/ no residual   Hyperlipidemia    Hypertension    followed by pcp   (11-29-2019  per pt stated never had stress test)   MDD (major depressive disorder)    Migraine    neurologist--- dr Terrace Arabia  OSA on CPAP    followed by dr dohmeier   PCOS (polycystic ovarian syndrome)    PONV (postoperative nausea and vomiting)    Type 2 diabetes mellitus (HCC)    followed by pcp   (11-29-2019  pt stated checks blood sugar daily in am,  fasting sugar--- 96      Objective:     LMP  (LMP Unknown)          Assessment   No problem-specific Assessment & Plan notes found for this encounter.     Sandrea Hughs, MD 03/17/2023

## 2023-03-18 ENCOUNTER — Ambulatory Visit: Payer: Medicare Other | Admitting: Internal Medicine

## 2023-03-22 ENCOUNTER — Encounter: Payer: Self-pay | Admitting: Internal Medicine

## 2023-03-22 ENCOUNTER — Ambulatory Visit (HOSPITAL_COMMUNITY): Payer: Medicare Other | Admitting: Psychiatry

## 2023-03-22 ENCOUNTER — Ambulatory Visit: Payer: Medicare Other | Attending: Internal Medicine | Admitting: Internal Medicine

## 2023-03-22 VITALS — BP 118/78 | HR 105 | Ht 63.0 in | Wt 223.4 lb

## 2023-03-22 DIAGNOSIS — R0609 Other forms of dyspnea: Secondary | ICD-10-CM | POA: Diagnosis not present

## 2023-03-22 DIAGNOSIS — E781 Pure hyperglyceridemia: Secondary | ICD-10-CM | POA: Diagnosis not present

## 2023-03-22 MED ORDER — METOPROLOL TARTRATE 25 MG PO TABS: 25.0000 mg | ORAL_TABLET | Freq: Two times a day (BID) | ORAL | 1 refills | Status: DC

## 2023-03-22 MED ORDER — FUROSEMIDE 20 MG PO TABS: 20.0000 mg | ORAL_TABLET | Freq: Every day | ORAL | 2 refills | Status: DC

## 2023-03-22 NOTE — Patient Instructions (Addendum)
Medication Instructions:  Your physician has recommended you make the following change in your medication:  Start taking Metoprolol Tartrate 25 mg twice a day Start taking Lasix 20 mg once daily Continue taking all other medications as prescribed   Labwork: [facility] in 5 days at [facility]  Testing/Procedures: Your physician has requested that you have an echocardiogram. Echocardiography is a painless test that uses sound waves to create images of your heart. It provides your doctor with information about the size and shape of your heart and how well your heart's chambers and valves are working. This procedure takes approximately one hour. There are no restrictions for this procedure. Please do NOT wear cologne, perfume, aftershave, or lotions (deodorant is allowed). Please arrive 15 minutes prior to your appointment time.   Follow-Up: Your physician recommends that you schedule a follow-up appointment in: 1 month telephone visit  Any Other Special Instructions Will Be Listed Below (If Applicable).  If you need a refill on your cardiac medications before your next appointment, please call your pharmacy.

## 2023-03-22 NOTE — Progress Notes (Signed)
Cardiology Office Note  Date: 03/22/2023   ID: Jamie [name], DOB 23-Nov-1970, MRN [id number]  PCP:  Encarnacion Slates, PA-C  Cardiologist:  Marjo Bicker, MD Electrophysiologist:  None   History of Present Illness: Jamie [name] is a 52 y.o. female known to have HTN, DM 2, HLD, morbid obesity is here for follow-up visit.  Patient was initially seen in the cardiology clinic in 2021 for evaluation of DOE and atypical chest pain.  Echo and NM stress test were unremarkable.  She was referred back again for similar symptoms.  She reported ongoing DOE, worsening.  She has atypical chest pains, mostly at rest and never with exertion.  No dizziness, presyncope, syncope, leg swelling.  Her METs are less than 4 and does not do much at home.  Past Medical History:  Diagnosis Date   Anxiety    Arthritis    knees   Bipolar 1 disorder (HCC)    Chronic diarrhea    Diverticulosis of colon    GERD (gastroesophageal reflux disease)    Hiatal hernia    History of concussion    per pt 11/ 2020 w/ no residual   Hyperlipidemia    Hypertension    followed by pcp   (11-29-2019  per pt stated never had stress test)   MDD (major depressive disorder)    Migraine    neurologist--- dr Terrace Arabia   OSA on CPAP    followed by dr dohmeier   PCOS (polycystic ovarian syndrome)    PONV (postoperative nausea and vomiting)    Type 2 diabetes mellitus (HCC)    followed by pcp   (11-29-2019  pt stated checks blood sugar daily in am,  fasting sugar--- 96    Past Surgical History:  Procedure Laterality Date   ANAL FISSURE REPAIR  x3  last one  2007  approx.   BIOPSY  09/06/2019   Procedure: BIOPSY;  Surgeon: [name], MD;  [facility];  Service: Endoscopy;;   COLONOSCOPY WITH PROPOFOL [name] 09/06/2019   pancolonic diverticulosis. Segmental biopsy completed with benign biopsies.    ESOPHAGOGASTRODUODENOSCOPY (EGD) WITH PROPOFOL [name] 06/16/2020   Dr. [name]: normal esophagus s/p dilation,  normal stomach, duodenum.    ESOPHAGOGASTRODUODENOSCOPY (EGD) WITH PROPOFOL [name] 02/10/2022   Procedure: ESOPHAGOGASTRODUODENOSCOPY (EGD) WITH PROPOFOL;  Surgeon: [name], MD;  [facility];  Service: Endoscopy;  Laterality: [name];  9:45am   EVALUATION UNDER ANESTHESIA WITH HEMORRHOIDECTOMY [name] 12/05/2019   Procedure: ANORECTAL EXAM UNDER ANESTHESIA WITH HEMORRHOIDECTOMY, HEMORRHOIDAL LIGATION/PEXY;  Surgeon: [name], MD;  Location:  SUR[facility]ERY [facility]ENTER;  Service: General;  Laterality: [name];  GENERAL AND LOCAL   EVALUATION UNDER ANESTHESIA WITH TEAR DUCT PROBING  infant   KNEE ARTHROSCOPY Right 1994   LAPAROSCOPIC CHOLECYSTECTOMY  08-06-2002  @[facility]    LUMBAR LAMINECTOMY/DECOMPRESSION MICRODISCECTOMY Right 01/22/2014   Procedure: LUMBAR FOUR TO FIVE LUMBAR LAMINECTOMY/DECOMPRESSION MICRODISCECTOMY 1 LEVEL;  Surgeon: Karn Cassis, MD;  [facility];  Service: Neurosurgery;  Laterality: Right;  Right L45 diskectomy   MALONEY DILATION [name] 06/16/2020   Procedure: Elease [name] DILATION;  Surgeon: [name], MD;  [facility];  Service: Endoscopy;  Laterality: [name];   MALONEY DILATION [name] 02/10/2022   Procedure: Elease [name] DILATION;  Surgeon: [name], MD;  [facility];  Service: Endoscopy;  Laterality: [name];    Current Outpatient Medications  Medication Sig Dispense Refill   ARIPiprazole (ABILIFY) 20 MG tablet Take 1  tablet (20 mg total) by mouth daily. 90 tablet 1   buPROPion (WELLBUTRIN XL) 150 MG 24 hr tablet Take 3 tablets (450 mg total) by mouth daily. 90 tablet 5   clonazePAM (KLONOPIN) 1 MG tablet Take 1 mg in the morning and 2 mg at night 90 tablet 4   DULoxetine (CYMBALTA) 60 MG capsule Take 2 capsules (120 mg total) by mouth at bedtime. 60 capsule 4   fluticasone (FLONASE) 50 MCG/ACT nasal spray Place 1 spray into both nostrils at bedtime as needed for allergies or rhinitis.      Fluticasone-Umeclidin-Vilant (TRELEGY ELLIPTA)  100-62.5-25 MCG/ACT AEPB Inhale 1 puff into the lungs daily. 28 each 5   furosemide (LASIX) 20 MG tablet Take 1 tablet (20 mg total) by mouth daily. 30 tablet 2   gabapentin (NEURONTIN) 300 MG capsule Take 600 mg by mouth at bedtime.     ibuprofen (ADVIL) 600 MG tablet Take 600 mg by mouth 3 (three) times daily.     insulin glargine (LANTUS SOLOSTAR) 100 UNIT/ML Solostar Pen Inject 30 Units into the skin at bedtime. 30 mL 3   insulin lispro (HUMALOG) 100 UNIT/ML KwikPen Inject 8-14 Units into the skin 3 (three) times daily. 36 mL 3   Insulin Pen Needle (CAREFINE PEN NEEDLES) 31G X 6 MM MISC Use to inject insulin 4 times daily 300 each 3   lisinopril (PRINIVIL,ZESTRIL) 5 MG tablet Take 5 mg by mouth daily.     metFORMIN (GLUCOPHAGE-XR) 500 MG 24 hr tablet Take 1 tablet (500 mg total) by mouth daily with breakfast. 90 tablet 3   metoprolol tartrate (LOPRESSOR) 25 MG tablet Take 1 tablet (25 mg total) by mouth 2 (two) times daily. 180 tablet 1   NON FORMULARY Pt uses a cpap nightly     omega-3 acid ethyl esters (LOVAZA) 1 g capsule Take 2 g by mouth 2 (two) times daily.     omeprazole (PRILOSEC) 40 MG capsule Take 1 capsule (40 mg total) by mouth daily. 30 capsule 11   ondansetron (ZOFRAN-ODT) 4 MG disintegrating tablet TAKE 1 TABLET BY MOUTH EVERY 8 HOURS AS NEEDED FOR NAUSEA AND VOMITING (Patient taking differently: No sig reported) 90 tablet 3   Pancrelipase, Lip-Prot-Amyl, (ZENPEP) 40000-126000 units CPEP Take 2 capsules with meals and 1 capsule with snacks. 270 capsule 3   rosuvastatin (CRESTOR) 20 MG tablet Take 20 mg by mouth daily.     traZODone (DESYREL) 100 MG tablet Take 4 tablets (400 mg total) by mouth at bedtime. 360 tablet 1   No current facility-administered medications for this visit.   Allergies:  Codeine, Ozempic (0.25 or 0.5 mg-dose) [semaglutide(0.25 or 0.5mg -dos)], and Morphine and codeine   Social History: The patient  reports that she has never smoked. She has never used  smokeless tobacco. She reports that she does not drink alcohol and does not use drugs.   Family History: The patient's family history includes Anxiety disorder in her maternal grandmother; COPD in her father; Depression in her maternal grandmother.   ROS:  Please see the history of present illness. Otherwise, complete review of systems is positive for none  All other systems are reviewed and negative.   Physical Exam: VS:  BP 118/78   Pulse (!) 105   Ht 5\' 3"  (1.6 m)   Wt 223 lb 6.4 oz (101.3 kg)   LMP  (LMP Unknown)   SpO2 95%   BMI 39.57 kg/m , BMI Body mass index is 39.57 kg/m.  Wt Readings  from Last 3 Encounters:  03/22/23 223 lb 6.4 oz (101.3 kg)  03/02/23 219 lb 9.6 oz (99.6 kg)  02/18/23 220 lb 6.4 oz (100 kg)    General: Patient appears comfortable at rest. HEENT: Conjunctiva and lids normal, oropharynx clear with moist mucosa. Neck: Supple, no elevated JVP or carotid bruits, no thyromegaly. Lungs: Clear to auscultation, nonlabored breathing at rest. Cardiac: Regular rate and rhythm, no S3 or significant systolic murmur, no pericardial rub. Abdomen: Soft, nontender, no hepatomegaly, bowel sounds present, no guarding or rebound. Extremities: No pitting edema, distal pulses 2+. Skin: Warm and dry. Musculoskeletal: No kyphosis. Neuropsychiatric: Alert and oriented x3, affect grossly appropriate.  Recent Labwork: 01/28/2023: Hemoglobin 11.2; Platelets 121 01/29/2023: BUN 13; Creatinine, Ser 0.74; Potassium 3.9; Sodium 134 02/18/2023: ALT 19; AST 18     Component Value Date/Time   CHOL 249 (H) 01/27/2023 0851   TRIG 779 (H) 01/29/2023 0448   HDL 24 (L) 01/27/2023 0851   CHOLHDL 10.4 01/27/2023 0851   VLDL UNABLE TO CALCULATE IF TRIGLYCERIDE OVER 400 mg/dL 36/64/4034 7425   LDLCALC UNABLE TO CALCULATE IF TRIGLYCERIDE OVER 400 mg/dL 95/63/8756 4332   LDLCALC  06/03/2022 0812     Comment:     . LDL cholesterol not calculated. Triglyceride levels greater than 400 mg/dL  invalidate calculated LDL results. . Reference range: <100 . Desirable range <100 mg/dL for primary prevention;   <70 mg/dL for patients with CHD or diabetic patients  with > or = 2 CHD risk factors. [name] Kitchen LDL-C is now calculated using the Martin-Hopkins  calculation, which is a validated novel method providing  better accuracy than the Friedewald equation in the  estimation of LDL-C.  Horald Pollen et al. Lenox Ahr. 9518;841(66): 2061-2068  (http://education.[employer].com/faq/FAQ164)    LDLDIRECT 39 01/27/2023 0851    Other Studies Reviewed Today:   Assessment and Plan:  DOE and atypical chest pain: She was seen by cardiology in 2021, echo and NM stress test were negative.  She continues to have similar symptoms and even worsening DOE. BMI is 39.57. Symptoms are likely secondary to deconditioning as her METs are less than 4 however CAD cannot be completely ruled out due to worsening DOE.  Chest pains are atypical, not ischemic in nature.  Occurs at rest and not with exertion.  Obtain 2D echocardiogram.  Start p.o. Lasix 20 mg once daily and obtain BMP in 5 days.  She would benefit from CT cardiac but due to chronic sinus tachycardia, will attempt to control the heart rate by starting metoprolol tartrate 25 mg twice daily and then schedule for CT cardiac.  I will see her back in 1 month (telephone encounter) to assess the improvement in HR. patient is instructed to keep a log of BP and HR for the next 1 month.  Hypertriglyceridemia: TG 779, elevated in 2024.  LDL within normal limits.  Continue Lovaza 2 g twice daily.       Medication Adjustments/Labs and Tests Ordered: Current medicines are reviewed at length with the patient today.  Concerns regarding medicines are outlined above.    Disposition:  Follow up  1 month Telephone visit  Signed Omarie Parcell Verne Spurr, MD, 03/22/2023 2:37 PM    [facility] at [facility] [address] [place], [place], [place] [address]

## 2023-03-23 ENCOUNTER — Ambulatory Visit: Payer: Medicare Other

## 2023-03-23 DIAGNOSIS — R0609 Other forms of dyspnea: Secondary | ICD-10-CM

## 2023-03-23 LAB — ECHOCARDIOGRAM COMPLETE
AR max vel: 3.15 cm2
AV Area VTI: 2.92 cm2
AV Area mean vel: 2.96 cm2
AV Mean grad: 3 mmHg
AV Peak grad: 5.9 mmHg
Ao pk vel: 1.21 m/s
Area-P 1/2: 2.64 cm2
Calc EF: 63.1 %
MV VTI: 3.03 cm2
S' Lateral: 2.2 cm
Single Plane A2C EF: 48.6 %
Single Plane A4C EF: 70.3 %

## 2023-03-23 MED ORDER — PERFLUTREN LIPID MICROSPHERE
1.0000 mL | INTRAVENOUS | Status: AC | PRN
  Administered 2023-03-23: 10 mL via INTRAVENOUS

## 2023-03-29 DIAGNOSIS — E781 Pure hyperglyceridemia: Secondary | ICD-10-CM | POA: Diagnosis not present

## 2023-03-30 ENCOUNTER — Encounter: Payer: Self-pay | Admitting: Internal Medicine

## 2023-03-30 ENCOUNTER — Telehealth: Payer: Self-pay | Admitting: Internal Medicine

## 2023-03-30 NOTE — Telephone Encounter (Signed)
Patient states she did not have batteries for her pulse ox I advised her to get batteries so she can monitor it and continue to take her medication due to her only just starting the medication

## 2023-03-30 NOTE — Telephone Encounter (Signed)
Pt c/o medication issue:  1. Name of Medication: Metoprolol  2. How are you currently taking this medication (dosage and times per day)?   3. Are you having a reaction (difficulty breathing--STAT)?   4. What is your medication issue? Started taking this medicine last night- she says her heart rate is still high- she says should it be lower?i

## 2023-04-05 ENCOUNTER — Other Ambulatory Visit: Payer: Self-pay | Admitting: Internal Medicine

## 2023-04-12 DIAGNOSIS — E7849 Other hyperlipidemia: Secondary | ICD-10-CM | POA: Diagnosis not present

## 2023-04-12 DIAGNOSIS — E1165 Type 2 diabetes mellitus with hyperglycemia: Secondary | ICD-10-CM | POA: Diagnosis not present

## 2023-04-19 DIAGNOSIS — L309 Dermatitis, unspecified: Secondary | ICD-10-CM | POA: Diagnosis not present

## 2023-04-19 DIAGNOSIS — I10 Essential (primary) hypertension: Secondary | ICD-10-CM | POA: Diagnosis not present

## 2023-04-19 DIAGNOSIS — E781 Pure hyperglyceridemia: Secondary | ICD-10-CM | POA: Diagnosis not present

## 2023-04-19 DIAGNOSIS — E119 Type 2 diabetes mellitus without complications: Secondary | ICD-10-CM | POA: Diagnosis not present

## 2023-04-19 DIAGNOSIS — Z23 Encounter for immunization: Secondary | ICD-10-CM | POA: Diagnosis not present

## 2023-04-20 ENCOUNTER — Encounter: Payer: Self-pay | Admitting: Internal Medicine

## 2023-04-26 ENCOUNTER — Encounter: Payer: Self-pay | Admitting: Internal Medicine

## 2023-04-26 ENCOUNTER — Ambulatory Visit: Payer: Medicare Other | Attending: Internal Medicine | Admitting: Internal Medicine

## 2023-04-26 ENCOUNTER — Telehealth: Payer: Self-pay | Admitting: *Deleted

## 2023-04-26 VITALS — Ht 63.0 in | Wt 218.0 lb

## 2023-04-26 DIAGNOSIS — Z794 Long term (current) use of insulin: Secondary | ICD-10-CM

## 2023-04-26 DIAGNOSIS — E781 Pure hyperglyceridemia: Secondary | ICD-10-CM

## 2023-04-26 DIAGNOSIS — R0609 Other forms of dyspnea: Secondary | ICD-10-CM

## 2023-04-26 DIAGNOSIS — E119 Type 2 diabetes mellitus without complications: Secondary | ICD-10-CM

## 2023-04-26 DIAGNOSIS — G4733 Obstructive sleep apnea (adult) (pediatric): Secondary | ICD-10-CM | POA: Diagnosis not present

## 2023-04-26 DIAGNOSIS — R0789 Other chest pain: Secondary | ICD-10-CM | POA: Insufficient documentation

## 2023-04-26 MED ORDER — FENOFIBRATE 134 MG PO CAPS: 140.0000 mg | ORAL_CAPSULE | Freq: Every day | ORAL | 2 refills | Status: DC

## 2023-04-26 MED ORDER — ROSUVASTATIN CALCIUM 10 MG PO TABS: 10.0000 mg | ORAL_TABLET | Freq: Every day | ORAL | 1 refills | Status: DC

## 2023-04-26 NOTE — Progress Notes (Addendum)
Virtual Visit via Telephone Note   Because of Jamie [name]'s co-morbid illnesses, she is at least at moderate risk for complications without adequate follow up.  This format is felt to be most appropriate for this patient at this time.  The patient did not have access to video technology/had technical difficulties with video requiring transitioning to audio format only (telephone).  All issues noted in this document were discussed and addressed.  No physical exam could be performed with this format.  Please refer to the patient's chart for her consent to telehealth for [facility]    Date:  04/26/2023   ID:  Jamie [name], DOB 10-01-1970, MRN [id number] The patient was identified using 2 identifiers.  Patient Location: Home Provider Location: Office/Clinic   PCP:  Joeseph Amor   [facility] Providers Cardiologist:  Jamie [name], Jamie [name]     Evaluation Performed:  Follow-Up Visit  History of Present Illness:    Jamie [name] is a 52 y.o. female known to have HTN, DM 2, HLD, hypertriglyceridemia, morbid obesity is here for follow-up visit.  Patient was initially seen in the cardiology clinic in 2021 for evaluation of DOE and atypical chest pain.  Echo and NM stress test were unremarkable.  She was referred back again for similar symptoms 1 p.o. Lasix 20 mg and metoprolol was started.  She also had severely elevated triglycerides, 1000s in 11/2022 after which she was started on Lovaza, repeat lipid panel in 01/2023 showed elevated triglycerides, 700s although improved from 11/2022.  She also had pancreatitis around the same time in 11/2022 when she was diagnosed with hypertriglyceridemia.  She is currently on Crestor 20 mg nightly and Lovaza 2 g twice daily.  DOE improved with Lasix, no chest pains after starting metoprolol, no symptoms overall, noes presyncope, syncope, leg swelling.  Her METs are less than 4 and does not do much at  home.  Echocardiogram in 8/24 showed normal LVEF, normal diastology and no valvular heart disease. Past Medical History:  Diagnosis Date   Anxiety    Arthritis    knees   Bipolar 1 disorder (HCC)    Chronic diarrhea    Diverticulosis of colon    GERD (gastroesophageal reflux disease)    Hiatal hernia    History of concussion    per pt 11/ 2020 w/ no residual   Hyperlipidemia    Hypertension    followed by pcp   (11-29-2019  per pt stated never had stress test)   MDD (major depressive disorder)    Migraine    neurologist--- dr Terrace Arabia   OSA on CPAP    followed by dr dohmeier   PCOS (polycystic ovarian syndrome)    PONV (postoperative nausea and vomiting)    Type 2 diabetes mellitus (HCC)    followed by pcp   (11-29-2019  pt stated checks blood sugar daily in am,  fasting sugar--- 96   Past Surgical History:  Procedure Laterality Date   ANAL FISSURE REPAIR  x3  last one  2007  approx.   BIOPSY  09/06/2019   Procedure: BIOPSY;  Surgeon: [name], Jamie [name];  [facility];  Service: Endoscopy;;   COLONOSCOPY WITH PROPOFOL [name] 09/06/2019   pancolonic diverticulosis. Segmental biopsy completed with benign biopsies.    ESOPHAGOGASTRODUODENOSCOPY (EGD) WITH PROPOFOL [name] 06/16/2020   Dr. [name]: normal esophagus s/p dilation, normal stomach, duodenum.    ESOPHAGOGASTRODUODENOSCOPY (EGD) WITH PROPOFOL [name] 02/10/2022   Procedure:  ESOPHAGOGASTRODUODENOSCOPY (EGD) WITH PROPOFOL;  Surgeon: [name], Jamie [name];  [facility];  Service: Endoscopy;  Laterality: [name];  9:45am   EVALUATION UNDER ANESTHESIA WITH HEMORRHOIDECTOMY [name] 12/05/2019   Procedure: ANORECTAL EXAM UNDER ANESTHESIA WITH HEMORRHOIDECTOMY, HEMORRHOIDAL LIGATION/PEXY;  Surgeon: [name], Jamie [name];  Location: [facility];  Service: General;  Laterality: [name];  GENERAL AND LOCAL   EVALUATION UNDER ANESTHESIA WITH TEAR DUCT PROBING  infant   KNEE ARTHROSCOPY Right 1994   LAPAROSCOPIC CHOLECYSTECTOMY   08-06-2002  @[facility]    LUMBAR LAMINECTOMY/DECOMPRESSION MICRODISCECTOMY Right 01/22/2014   Procedure: LUMBAR FOUR TO FIVE LUMBAR LAMINECTOMY/DECOMPRESSION MICRODISCECTOMY 1 LEVEL;  Surgeon: Karn Cassis, Jamie [name];  [facility]: [facility];  Service: Neurosurgery;  Laterality: Right;  Right L45 diskectomy   MALONEY DILATION [name] 06/16/2020   Procedure: Elease [name] DILATION;  Surgeon: [name], Jamie [name];  [facility];  Service: Endoscopy;  Laterality: [name];   MALONEY DILATION [name] 02/10/2022   Procedure: Elease [name] DILATION;  Surgeon: [name], Jamie [name];  [facility];  Service: Endoscopy;  Laterality: [name];     Current Meds  Medication Sig   ARIPiprazole (ABILIFY) 20 MG tablet Take 1 tablet (20 mg total) by mouth daily.   buPROPion (WELLBUTRIN XL) 150 MG 24 hr tablet Take 3 tablets (450 mg total) by mouth daily.   clonazePAM (KLONOPIN) 1 MG tablet Take 1 mg in the morning and 2 mg at night   DULoxetine (CYMBALTA) 60 MG capsule Take 2 capsules (120 mg total) by mouth at bedtime.   fenofibrate micronized (LOFIBRA) 134 MG capsule Take 1 capsule (134 mg total) by mouth daily.   fluticasone (FLONASE) 50 MCG/ACT nasal spray Place 1 spray into both nostrils at bedtime as needed for allergies or rhinitis.    Fluticasone-Umeclidin-Vilant (TRELEGY ELLIPTA) 100-62.5-25 MCG/ACT AEPB Inhale 1 puff into the lungs daily.   furosemide (LASIX) 20 MG tablet Take 1 tablet (20 mg total) by mouth daily.   gabapentin (NEURONTIN) 300 MG capsule Take 600 mg by mouth at bedtime.   ibuprofen (ADVIL) 600 MG tablet Take 600 mg by mouth 3 (three) times daily.   insulin glargine (LANTUS SOLOSTAR) 100 UNIT/ML Solostar Pen Inject 30 Units into the skin at bedtime.   insulin lispro (HUMALOG) 100 UNIT/ML KwikPen Inject 8-14 Units into the skin 3 (three) times daily.   Insulin Pen Needle (CAREFINE PEN NEEDLES) 31G X 6 MM MISC Use to inject insulin 4 times daily   lisinopril (PRINIVIL,ZESTRIL) 5 MG tablet Take 5 mg by mouth  daily.   metFORMIN (GLUCOPHAGE-XR) 500 MG 24 hr tablet Take 1 tablet (500 mg total) by mouth daily with breakfast.   metoprolol tartrate (LOPRESSOR) 25 MG tablet Take 1 tablet (25 mg total) by mouth 2 (two) times daily.   NON FORMULARY Pt uses a cpap nightly   omega-3 acid ethyl esters (LOVAZA) 1 g capsule Take 2 g by mouth 2 (two) times daily.   omeprazole (PRILOSEC) 40 MG capsule Take 1 capsule (40 mg total) by mouth daily.   ondansetron (ZOFRAN-ODT) 4 MG disintegrating tablet TAKE 1 TABLET BY MOUTH EVERY 8 HOURS AS NEEDED FOR NAUSEA AND VOMITING (Patient taking differently: No sig reported)   Pancrelipase, Lip-Prot-Amyl, (ZENPEP) 40000-126000 units CPEP TAKE TWO CAPSULES BY MOUTH THREE TIMES DAILY WITH MEALS AND 1 CAPSULE WITH AT least TWO snacks   rosuvastatin (CRESTOR) 10 MG tablet Take 1 tablet (10 mg total) by mouth at bedtime.   traZODone (DESYREL) 100 MG tablet Take 4  tablets (400 mg total) by mouth at bedtime.   [DISCONTINUED] rosuvastatin (CRESTOR) 20 MG tablet Take 20 mg by mouth daily.     Allergies:   Codeine, Ozempic (0.25 or 0.5 mg-dose) [semaglutide(0.25 or 0.5mg -dos)], and Morphine and codeine   Social History   Tobacco Use   Smoking status: Never   Smokeless tobacco: Never  Vaping Use   Vaping status: Never Used  Substance Use Topics   Alcohol use: No   Drug use: Never     Family Hx: The patient's family history includes Anxiety disorder in her maternal grandmother; COPD in her father; Depression in her maternal grandmother. There is no history of Colon cancer or Colon polyps.  ROS:   Please see the history of present illness.     All other systems reviewed and are negative.   Prior CV studies:   The following studies were reviewed today:    Labs/Other Tests and Data Reviewed:    EKG:    Recent Labs: 01/28/2023: Hemoglobin 11.2; Platelets 121 01/29/2023: BUN 13; Creatinine, Ser 0.74; Potassium 3.9; Sodium 134 02/18/2023: ALT 19   Recent Lipid Panel Lab  Results  Component Value Date/Time   CHOL 249 (H) 01/27/2023 08:51 AM   TRIG 779 (H) 01/29/2023 04:48 AM   HDL 24 (L) 01/27/2023 08:51 AM   CHOLHDL 10.4 01/27/2023 08:51 AM   LDLCALC UNABLE TO CALCULATE IF TRIGLYCERIDE OVER 400 mg/dL 16/04/9603 54:09 AM   LDLCALC  06/03/2022 08:12 AM     Comment:     . LDL cholesterol not calculated. Triglyceride levels greater than 400 mg/dL invalidate calculated LDL results. . Reference range: <100 . Desirable range <100 mg/dL for primary prevention;   <70 mg/dL for patients with CHD or diabetic patients  with > or = 2 CHD risk factors. [name] Kitchen LDL-C is now calculated using the Martin-Hopkins  calculation, which is a validated novel method providing  better accuracy than the Friedewald equation in the  estimation of LDL-C.  Horald Pollen et al. Lenox Ahr. 8119;147(82): 2061-2068  (http://education.[facility].com/faq/FAQ164)    LDLDIRECT 39 01/27/2023 08:51 AM    Wt Readings from Last 3 Encounters:  04/26/23 218 lb (98.9 kg)  03/22/23 223 lb 6.4 oz (101.3 kg)  03/02/23 219 lb 9.6 oz (99.6 kg)     Risk Assessment/Calculations:         Objective:    Vital Signs:  Ht 5\' 3"  (1.6 m)   Wt 218 lb (98.9 kg)   LMP  (LMP Unknown)   BMI 38.62 kg/m     ASSESSMENT & PLAN:     DOE, questionable chronic diastolic heart failure: Echocardiogram in 8/24 showed normal LVEF, normal diastology and no valvular heart disease.  Symptoms of DOE significantly improved after starting p.o. Lasix 20 mg once daily.  Normal serum creatinine.  Unclear if patient has exertional diastolic dysfunction.  Atypical chest pain likely secondary to DOE, resolved: Chest pains likely secondary DOE, she also had elevated heart rates (sinus tachycardia) for which metoprolol tartrate 25 mg twice daily was started last clinic visit with significant improvement.  Continue metoprolol tartrate 25 mg twice daily.  Hypertriglyceridemia: TG 1000s in 11/2022 due to which she was  hospitalized with pancreatitis. Repeat EGD in 7/24 was 779 after starting Lovaza 2 g twice daily and already was on Crestor 20 mg nightly.  Will cut back on the dose of Crestor from 20 mg to 10 mg nightly and add fenofibrate 134 mg once daily.  Obtain lipid panel from the PCP (  patient reported she got her blood work yesterday) and repeat Pete panel in 3 months before the next visit.  Placed referral to advanced lipid disorders clinic for management of severe hypertriglyceridemia.  Aggressive DM2 control.  Type 2 diabetes mellitus, insulin-dependent: She was initially started on Ozempic but later was discontinued due to pancreatitis.  OSA on CPAP: Continue CPAP.     Time:   Today, I have spent 14 minutes with the patient with telehealth technology discussing the above problems.     Medication Adjustments/Labs and Tests Ordered: Current medicines are reviewed at length with the patient today.  Concerns regarding medicines are outlined above.   Tests Ordered: Orders Placed This Encounter  Procedures   Lipid panel   AMB Referral to Advanced Lipid Disorders Clinic    Medication Changes: Meds ordered this encounter  Medications   rosuvastatin (CRESTOR) 10 MG tablet    Sig: Take 1 tablet (10 mg total) by mouth at bedtime.    Dispense:  90 tablet    Refill:  1    04/26/2023-Dose decrease   fenofibrate micronized (LOFIBRA) 134 MG capsule    Sig: Take 1 capsule (134 mg total) by mouth daily.    Dispense:  90 capsule    Refill:  2    04/26/2023-New    Follow Up:  In Person  3 months  Signed, Jamie [name], Jamie [name]  04/26/2023 1:02 PM    [facility]

## 2023-04-26 NOTE — Addendum Note (Signed)
Addended by: Herbert Deaner on: 04/26/2023 01:05 PM   Modules accepted: Level of Service

## 2023-04-26 NOTE — Patient Instructions (Addendum)
Medication Instructions:  Your physician has recommended you make the following change in your medication:  Decrease Rosuvastatin from 20 mg to 10 mg daily at bedtime. Start taking Fenofibrate 140 mg once daily (if you have any muscle cramps can hold Rosuvastatin)  Labwork: Will request labs from primary care physician that was completed on yesterday Lipid panel to be completed prior to 3 month follow up at [facility]  Testing/Procedures: None  Follow-Up: Your physician recommends that you schedule a follow-up appointment in: 3 months  Any Other Special Instructions Will Be Listed Below (If Applicable). Referral to Lipid Clinic  Thank you for choosing [facility]!     If you need a refill on your cardiac medications before your next appointment, please call your pharmacy.

## 2023-04-26 NOTE — Telephone Encounter (Signed)
  Patient Consent for Virtual Visit        Jamie [name] has provided verbal consent on 04/26/2023 for a virtual visit (video or telephone).   CONSENT FOR VIRTUAL VISIT FOR:  Jamie [name]  By participating in this virtual visit I agree to the following:  I hereby voluntarily request, consent and authorize [facility] and its employed or contracted physicians, physician assistants, nurse practitioners or other licensed health care professionals (the Practitioner), to provide me with telemedicine health care services (the "Services") as deemed necessary by the treating Practitioner. I acknowledge and consent to receive the Services by the Practitioner via telemedicine. I understand that the telemedicine visit will involve communicating with the Practitioner through live audiovisual communication technology and the disclosure of certain medical information by electronic transmission. I acknowledge that I have been given the opportunity to request an in-person assessment or other available alternative prior to the telemedicine visit and am voluntarily participating in the telemedicine visit.  I understand that I have the right to withhold or withdraw my consent to the use of telemedicine in the course of my care at any time, without affecting my right to future care or treatment, and that the Practitioner or I may terminate the telemedicine visit at any time. I understand that I have the right to inspect all information obtained and/or recorded in the course of the telemedicine visit and may receive copies of available information for a reasonable fee.  I understand that some of the potential risks of receiving the Services via telemedicine include:  Delay or interruption in medical evaluation due to technological equipment failure or disruption; Information transmitted may not be sufficient (e.g. poor resolution of images) to allow for appropriate medical decision making by the  Practitioner; and/or  In rare instances, security protocols could fail, causing a breach of personal health information.  Furthermore, I acknowledge that it is my responsibility to provide information about my medical history, conditions and care that is complete and accurate to the best of my ability. I acknowledge that Practitioner's advice, recommendations, and/or decision may be based on factors not within their control, such as incomplete or inaccurate data provided by me or distortions of diagnostic images or specimens that may result from electronic transmissions. I understand that the practice of medicine is not an exact science and that Practitioner makes no warranties or guarantees regarding treatment outcomes. I acknowledge that a copy of this consent can be made available to me via my patient portal [facility] MyChart), or I can request a printed copy by calling the office of [facility].    I understand that my insurance will be billed for this visit.   I have read or had this consent read to me. I understand the contents of this consent, which adequately explains the benefits and risks of the Services being provided via telemedicine.  I have been provided ample opportunity to ask questions regarding this consent and the Services and have had my questions answered to my satisfaction. I give my informed consent for the services to be provided through the use of telemedicine in my medical care

## 2023-04-28 NOTE — Progress Notes (Deleted)
Jamie [name], female    DOB: July 20, 1971   MRN: [id number]   Brief patient profile:  ***  *** self referred to pulmonary clinic 04/29/2023  for ***    had been seeing Jamie [name]     History of Present Illness  04/29/2023  Pulmonary/ 1st office eval/Jamie [name]  No chief complaint on file.    Dyspnea:  *** Cough: *** Sleep: *** SABA use:     Outpatient Medications Prior to Visit  Medication Sig Dispense Refill   ARIPiprazole (ABILIFY) 20 MG tablet Take 1 tablet (20 mg total) by mouth daily. 90 tablet 1   buPROPion (WELLBUTRIN XL) 150 MG 24 hr tablet Take 3 tablets (450 mg total) by mouth daily. 90 tablet 5   clonazePAM (KLONOPIN) 1 MG tablet Take 1 mg in the morning and 2 mg at night 90 tablet 4   DULoxetine (CYMBALTA) 60 MG capsule Take 2 capsules (120 mg total) by mouth at bedtime. 60 capsule 4   fenofibrate micronized (LOFIBRA) 134 MG capsule Take 1 capsule (134 mg total) by mouth daily. 90 capsule 2   fluticasone (FLONASE) 50 MCG/ACT nasal spray Place 1 spray into both nostrils at bedtime as needed for allergies or rhinitis.      Fluticasone-Umeclidin-Vilant (TRELEGY ELLIPTA) 100-62.5-25 MCG/ACT AEPB Inhale 1 puff into the lungs daily. 28 each 5   furosemide (LASIX) 20 MG tablet Take 1 tablet (20 mg total) by mouth daily. 30 tablet 2   gabapentin (NEURONTIN) 300 MG capsule Take 600 mg by mouth at bedtime.     ibuprofen (ADVIL) 600 MG tablet Take 600 mg by mouth 3 (three) times daily.     insulin glargine (LANTUS SOLOSTAR) 100 UNIT/ML Solostar Pen Inject 30 Units into the skin at bedtime. 30 mL 3   insulin lispro (HUMALOG) 100 UNIT/ML KwikPen Inject 8-14 Units into the skin 3 (three) times daily. 36 mL 3   Insulin Pen Needle (CAREFINE PEN NEEDLES) 31G X 6 MM MISC Use to inject insulin 4 times daily 300 each 3   lisinopril (PRINIVIL,ZESTRIL) 5 MG tablet Take 5 mg by mouth daily.     metFORMIN (GLUCOPHAGE-XR) 500 MG 24 hr tablet Take 1 tablet (500 mg total) by mouth daily with  breakfast. 90 tablet 3   metoprolol tartrate (LOPRESSOR) 25 MG tablet Take 1 tablet (25 mg total) by mouth 2 (two) times daily. 180 tablet 1   NON FORMULARY Pt uses a cpap nightly     omega-3 acid ethyl esters (LOVAZA) 1 g capsule Take 2 g by mouth 2 (two) times daily.     omeprazole (PRILOSEC) 40 MG capsule Take 1 capsule (40 mg total) by mouth daily. 30 capsule 11   ondansetron (ZOFRAN-ODT) 4 MG disintegrating tablet TAKE 1 TABLET BY MOUTH EVERY 8 HOURS AS NEEDED FOR NAUSEA AND VOMITING (Patient taking differently: No sig reported) 90 tablet 3   Pancrelipase, Lip-Prot-Amyl, (ZENPEP) 40000-126000 units CPEP TAKE TWO CAPSULES BY MOUTH THREE TIMES DAILY WITH MEALS AND 1 CAPSULE WITH AT least TWO snacks 270 capsule 3   rosuvastatin (CRESTOR) 10 MG tablet Take 1 tablet (10 mg total) by mouth at bedtime. 90 tablet 1   traZODone (DESYREL) 100 MG tablet Take 4 tablets (400 mg total) by mouth at bedtime. 360 tablet 1   No facility-administered medications prior to visit.    Past Medical History:  Diagnosis Date   Anxiety    Arthritis    knees   Bipolar 1 disorder (HCC)  Chronic diarrhea    Diverticulosis of colon    GERD (gastroesophageal reflux disease)    Hiatal hernia    History of concussion    per pt 11/ 2020 w/ no residual   Hyperlipidemia    Hypertension    followed by pcp   (11-29-2019  per pt stated never had stress test)   MDD (major depressive disorder)    Migraine    neurologist--- Jamie Jamie [name]   OSA on CPAP    followed by Jamie Jamie [name]   PCOS (polycystic ovarian syndrome)    PONV (postoperative nausea and vomiting)    Type 2 diabetes mellitus (HCC)    followed by pcp   (11-29-2019  pt stated checks blood sugar daily in am,  fasting sugar--- 96      Objective:     LMP  (LMP Unknown)          Assessment   No problem-specific Assessment & Plan notes found for this encounter.     Jamie Hughs, MD 04/28/2023

## 2023-04-29 ENCOUNTER — Ambulatory Visit: Payer: Medicare Other | Admitting: Internal Medicine

## 2023-05-03 ENCOUNTER — Ambulatory Visit: Payer: Medicare Other | Admitting: Nutrition

## 2023-05-11 ENCOUNTER — Telehealth: Payer: Self-pay | Admitting: *Deleted

## 2023-05-12 NOTE — Telephone Encounter (Signed)
Patient left a message that she had not received her sensors. It appears that the order had went out  originally through [employer]. I have sent a message to [facility] and ask that she reach out to the patient.

## 2023-05-16 ENCOUNTER — Ambulatory Visit: Payer: Medicare Other | Admitting: Neurology

## 2023-05-17 ENCOUNTER — Ambulatory Visit: Payer: Medicare Other | Admitting: Internal Medicine

## 2023-05-24 ENCOUNTER — Encounter: Payer: Self-pay | Admitting: Internal Medicine

## 2023-05-24 ENCOUNTER — Ambulatory Visit: Payer: Medicare Other | Admitting: Internal Medicine

## 2023-05-24 VITALS — BP 103/67 | HR 99 | Temp 97.6°F | Ht 63.0 in | Wt 226.4 lb

## 2023-05-24 DIAGNOSIS — R7989 Other specified abnormal findings of blood chemistry: Secondary | ICD-10-CM | POA: Diagnosis not present

## 2023-05-24 DIAGNOSIS — Z8719 Personal history of other diseases of the digestive system: Secondary | ICD-10-CM | POA: Diagnosis not present

## 2023-05-24 DIAGNOSIS — R1032 Left lower quadrant pain: Secondary | ICD-10-CM

## 2023-05-24 NOTE — Progress Notes (Signed)
Primary Care Physician:  Joeseph Amor Primary Gastroenterologist:  Dr. [name]  Pre-Procedure History & Physical: HPI:  Jamie [name] is a 52 y.o. female here for for evaluation of recurrent epigastric left lower quadrant abdominal pain.  Multiple episodes of interstitial pancreatitis felt to be secondary to triglycerides (greater than thousand) on treatment now less then thousand.  Previously admitted to the hospital with negative evaluation gallbladder out no biliary dilation and slight bump in alkaline phosphatase previously.  She has had no fever chills no yellow jaundice.  Denies constipation or rectal bleeding.  No urinary tract symptoms.  Left lower quadrant abdominal pain a little bit out of character of her typical symptoms of pancreatitis.  She continues on pancreatic enzyme supplements and twice daily Protonix.  No further dysphagia since she had her esophagus dilated previously  IgG4 subclass was well within normal limits.  No medications to implicate.  Past Medical History:  Diagnosis Date   Anxiety    Arthritis    knees   Bipolar 1 disorder (HCC)    Chronic diarrhea    Diverticulosis of colon    GERD (gastroesophageal reflux disease)    Hiatal hernia    History of concussion    per pt 11/ 2020 w/ no residual   Hyperlipidemia    Hypertension    followed by pcp   (11-29-2019  per pt stated never had stress test)   MDD (major depressive disorder)    Migraine    neurologist--- dr Terrace Arabia   OSA on CPAP    followed by dr dohmeier   PCOS (polycystic ovarian syndrome)    PONV (postoperative nausea and vomiting)    Type 2 diabetes mellitus (HCC)    followed by pcp   (11-29-2019  pt stated checks blood sugar daily in am,  fasting sugar--- 96    Past Surgical History:  Procedure Laterality Date   ANAL FISSURE REPAIR  x3  last one  2007  approx.   BIOPSY  09/06/2019   Procedure: BIOPSY;  Surgeon: [name], MD;  Location: [facility];  Service: Endoscopy;;    COLONOSCOPY WITH PROPOFOL [name] 09/06/2019   pancolonic diverticulosis. Segmental biopsy completed with benign biopsies.    ESOPHAGOGASTRODUODENOSCOPY (EGD) WITH PROPOFOL [name] 06/16/2020   Dr. [name]: normal esophagus s/p dilation, normal stomach, duodenum.    ESOPHAGOGASTRODUODENOSCOPY (EGD) WITH PROPOFOL [name] 02/10/2022   Procedure: ESOPHAGOGASTRODUODENOSCOPY (EGD) WITH PROPOFOL;  Surgeon: [name], MD;  Location: [facility];  Service: Endoscopy;  Laterality: [name];  9:45am   EVALUATION UNDER ANESTHESIA WITH HEMORRHOIDECTOMY [name] 12/05/2019   Procedure: ANORECTAL EXAM UNDER ANESTHESIA WITH HEMORRHOIDECTOMY, HEMORRHOIDAL LIGATION/PEXY;  Surgeon: [name], MD;  Location: [facility];  Service: General;  Laterality: [name];  GENERAL AND LOCAL   EVALUATION UNDER ANESTHESIA WITH TEAR DUCT PROBING  infant   KNEE ARTHROSCOPY Right 1994   LAPAROSCOPIC CHOLECYSTECTOMY  08-06-2002  @[facility]    LUMBAR LAMINECTOMY/DECOMPRESSION MICRODISCECTOMY Right 01/22/2014   Procedure: LUMBAR FOUR TO FIVE LUMBAR LAMINECTOMY/DECOMPRESSION MICRODISCECTOMY 1 LEVEL;  Surgeon: Karn Cassis, MD;  Location: [facility];  Service: Neurosurgery;  Laterality: Right;  Right L45 diskectomy   MALONEY DILATION [name] 06/16/2020   Procedure: Elease [name] DILATION;  Surgeon: [name], MD;  Location: [facility];  Service: Endoscopy;  Laterality: [name];   MALONEY DILATION [name] 02/10/2022   Procedure: Elease [name] DILATION;  Surgeon: [name], MD;  Location: [facility];  Service: Endoscopy;  Laterality: [name];  Prior to Admission medications   Medication Sig Start Date End Date Taking? Authorizing Provider  ACCU-CHEK GUIDE test strip  05/18/23  Yes [provider]  ARIPiprazole (ABILIFY) 20 MG tablet Take 1 tablet (20 mg total) by mouth daily. 03/08/23  Yes Plovsky, Earvin Hansen, MD  buPROPion (WELLBUTRIN XL) 150 MG 24 hr tablet Take 3 tablets (450 mg total) by mouth daily. 03/08/23  Yes Plovsky, Earvin Hansen, MD   clonazePAM (KLONOPIN) 1 MG tablet Take 1 mg in the morning and 2 mg at night 03/08/23  Yes Plovsky, Earvin Hansen, MD  DULoxetine (CYMBALTA) 60 MG capsule Take 2 capsules (120 mg total) by mouth at bedtime. 03/08/23  Yes Plovsky, Earvin Hansen, MD  fenofibrate micronized (LOFIBRA) 134 MG capsule Take 1 capsule (134 mg total) by mouth daily. 04/26/23  Yes Mallipeddi, Vishnu P, MD  fluticasone (FLONASE) 50 MCG/ACT nasal spray Place 1 spray into both nostrils at bedtime as needed for allergies or rhinitis.    Yes [provider]  Fluticasone-Umeclidin-Vilant (TRELEGY ELLIPTA) 100-62.5-25 MCG/ACT AEPB Inhale 1 puff into the lungs daily. 12/06/22  Yes Martina Sinner, MD  furosemide (LASIX) 20 MG tablet Take 1 tablet (20 mg total) by mouth daily. 03/22/23  Yes Mallipeddi, Vishnu P, MD  gabapentin (NEURONTIN) 300 MG capsule Take 600 mg by mouth at bedtime. 02/19/20  Yes [provider]  ibuprofen (ADVIL) 600 MG tablet Take 600 mg by mouth every 4 (four) hours as needed. 12/13/22  Yes [provider]  insulin glargine (LANTUS SOLOSTAR) 100 UNIT/ML Solostar Pen Inject 30 Units into the skin at bedtime. 03/02/23  Yes Reardon, Freddi Starr, NP  insulin lispro (HUMALOG) 100 UNIT/ML KwikPen Inject 8-14 Units into the skin 3 (three) times daily. 03/02/23  Yes Reardon, Freddi Starr, NP  Insulin Pen Needle (CAREFINE PEN NEEDLES) 31G X 6 MM MISC Use to inject insulin 4 times daily 03/02/23  Yes Reardon, Alphonzo Lemmings J, NP  lisinopril (PRINIVIL,ZESTRIL) 5 MG tablet Take 5 mg by mouth daily.   Yes [provider]  metFORMIN (GLUCOPHAGE-XR) 500 MG 24 hr tablet Take 1 tablet (500 mg total) by mouth daily with breakfast. 03/02/23  Yes Dani Gobble, NP  metoprolol tartrate (LOPRESSOR) 25 MG tablet Take 1 tablet (25 mg total) by mouth 2 (two) times daily. 03/22/23  Yes Mallipeddi, Vishnu P, MD  NON FORMULARY Pt uses a cpap nightly   Yes [provider]  omega-3 acid ethyl esters (LOVAZA) 1 g capsule Take 2 g  by mouth 2 (two) times daily. 10/22/22  Yes [provider]  omeprazole (PRILOSEC) 40 MG capsule Take 1 capsule (40 mg total) by mouth daily. 02/18/23  Yes Kalii Chesmore, Gerrit Friends, MD  ondansetron (ZOFRAN-ODT) 4 MG disintegrating tablet TAKE 1 TABLET BY MOUTH EVERY 8 HOURS AS NEEDED FOR NAUSEA AND VOMITING Patient taking differently: No sig reported 08/31/22  Yes Zayvian Mcmurtry, Gerrit Friends, MD  Pancrelipase, Lip-Prot-Amyl, (ZENPEP) 40000-126000 units CPEP TAKE TWO CAPSULES BY MOUTH THREE TIMES DAILY WITH MEALS AND 1 CAPSULE WITH AT least TWO snacks 04/05/23  Yes Paizlie Klaus, Gerrit Friends, MD  rosuvastatin (CRESTOR) 10 MG tablet Take 1 tablet (10 mg total) by mouth at bedtime. 04/26/23  Yes Mallipeddi, Vishnu P, MD  traZODone (DESYREL) 100 MG tablet Take 4 tablets (400 mg total) by mouth at bedtime. 03/08/23  Yes Archer Asa, MD    Allergies as of 05/24/2023 - Review Complete 05/24/2023  Allergen Reaction Noted   Codeine Hives and Swelling 06/07/2011   Ozempic (0.25 or 0.5 mg-dose) [semaglutide(0.25  or 0.5mg -dos)]  03/02/2023   Morphine and codeine Swelling and Rash 01/21/2014    Family History  Problem Relation Age of Onset   COPD Father    Anxiety disorder Maternal Grandmother    Depression Maternal Grandmother    Colon cancer Neg Hx    Colon polyps Neg Hx     Social History   Socioeconomic History   Marital status: Married    Spouse name: Not on file   Number of children: 0   Years of education: college   Highest education level: Not on file  Occupational History   Occupation: disabled  Tobacco Use   Smoking status: Never   Smokeless tobacco: Never  Vaping Use   Vaping status: Never Used  Substance and Sexual Activity   Alcohol use: No   Drug use: Never   Sexual activity: Yes    Partners: Male    Birth control/protection: None  Other Topics Concern   Not on file  Social History Narrative   Patient lives at home alone    Patient is right handed   Patient drinks soda's daily   Social  Determinants of Health   Financial Resource Strain: Not on file  Food Insecurity: No Food Insecurity (01/27/2023)   Hunger Vital Sign    Worried About Running Out of Food in the Last Year: Never true    Ran Out of Food in the Last Year: Never true  Transportation Needs: No Transportation Needs (01/27/2023)   PRAPARE - Administrator, Civil Service (Medical): No    Lack of Transportation (Non-Medical): No  Physical Activity: Not on file  Stress: Not on file  Social Connections: Not on file  Intimate Partner Violence: Not At Risk (01/27/2023)   Humiliation, Afraid, Rape, and Kick questionnaire    Fear of Current or Ex-Partner: No    Emotionally Abused: No    Physically Abused: No    Sexually Abused: No    Review of Systems: See HPI, otherwise negative ROS  Physical Exam: BP 103/67 (BP Location: Left Arm, Patient Position: Sitting, Cuff Size: Large)   Pulse 99   Temp 97.6 F (36.4 C) (Oral)   Ht 5\' 3"  (1.6 m)   Wt 226 lb 6.4 oz (102.7 kg)   LMP  (LMP Unknown)   SpO2 94%   BMI 40.10 kg/m  General:   Alert,  Well-developed, well-nourished, pleasant and cooperative in Lungs:  Clear throughout to auscultation.   No wheezes, crackles, or rhonchi. No acute distress. Heart:  Regular rate and rhythm; no murmurs, clicks, rubs,  or gallops. Abdomen: Obese.  Positive bowel sounds minimal epigastric medial left lower quadrant abdominal tenderness palpation.  No obvious mass organomegaly  pulses:  Normal pulses noted. Extremities:  Without clubbing or edema.  Impression/Plan: 52 year old lady with recent interstitial pancreatitis presumed to be secondary to hypertriglyceridemia on treatment her triglycerides now less than 100 some recurrent epigastric left lower quadrant abdominal pain not too dissimilar from pain for which she presented with pancreatitis previously. Does not appear at all acutely ill or toxic at this time.  Need to consider other causes of abdominal pain. No  dysphagia.  On PPI and pancreatic enzymes.  Recommendations:  Further evaluation needed  Urinalysis, CBC, lipase hepatic function profile today  May need further evaluation i.e. a CT scan in the near future  For now stay on a low residue diet  No change in medication regimen at this time  Further recommendations to follow.  Notice: This dictation was prepared with Dragon dictation along with smaller phrase technology. Any transcriptional errors that result from this process are unintentional and may not be corrected upon review.

## 2023-05-24 NOTE — Patient Instructions (Signed)
It was good to see you again today!  The cause of your left lower quadrant abdominal pain is not clear.  Further evaluation needed  Urinalysis, CBC, lipase hepatic function profile today  May need further evaluation i.e. a CT scan in the near future  I am glad the triglycerides have come down below 1000.  For now stay on a low residue diet  No change in medication regimen at this time  Further recommendations to follow.

## 2023-05-25 DIAGNOSIS — R7989 Other specified abnormal findings of blood chemistry: Secondary | ICD-10-CM | POA: Diagnosis not present

## 2023-05-25 DIAGNOSIS — Z8719 Personal history of other diseases of the digestive system: Secondary | ICD-10-CM | POA: Diagnosis not present

## 2023-05-25 MED ORDER — DEXCOM G7 SENSOR MISC: 0 refills | Status: DC

## 2023-05-25 NOTE — Telephone Encounter (Signed)
Notified pt that [employer] sent her a letter with a different diabetic supply company that her insurance uses. They sent information to the pt so she can give consent to talk to [place]. Insurance will not pay for supplies through [employer]. Pt does not want to do this. She wants this sent to [facility] drug. Rx sent.

## 2023-05-25 NOTE — Telephone Encounter (Signed)
Pt states she has not heard anything from [employer] and wants to know if g7 sensors can be sent into [facility].

## 2023-05-25 NOTE — Addendum Note (Signed)
Addended by: Jannifer Franklin A on: 05/25/2023 04:02 PM   Modules accepted: Orders

## 2023-05-26 LAB — URINALYSIS
Bilirubin Urine: NEGATIVE
Hgb urine dipstick: NEGATIVE
Nitrite: NEGATIVE
Protein, ur: NEGATIVE
Specific Gravity, Urine: 1.023 (ref 1.001–1.035)
pH: 5.5 (ref 5.0–8.0)

## 2023-05-26 LAB — HEPATIC FUNCTION PANEL
AG Ratio: 1.7 (calc) (ref 1.0–2.5)
ALT: 22 U/L (ref 6–29)
AST: 23 U/L (ref 10–35)
Albumin: 4.3 g/dL (ref 3.6–5.1)
Alkaline phosphatase (APISO): 81 U/L (ref 37–153)
Bilirubin, Direct: 0.1 mg/dL (ref 0.0–0.2)
Globulin: 2.6 g/dL (ref 1.9–3.7)
Indirect Bilirubin: 0.2 mg/dL (ref 0.2–1.2)
Total Bilirubin: 0.3 mg/dL (ref 0.2–1.2)
Total Protein: 6.9 g/dL (ref 6.1–8.1)

## 2023-05-26 LAB — CBC WITH DIFFERENTIAL/PLATELET
Absolute Lymphocytes: 2208 {cells}/uL (ref 850–3900)
Absolute Monocytes: 414 {cells}/uL (ref 200–950)
Basophils Absolute: 42 {cells}/uL (ref 0–200)
Basophils Relative: 0.7 %
Eosinophils Absolute: 42 {cells}/uL (ref 15–500)
Eosinophils Relative: 0.7 %
HCT: 43.2 % (ref 35.0–45.0)
Hemoglobin: 14.6 g/dL (ref 11.7–15.5)
MCH: 28.7 pg (ref 27.0–33.0)
MCHC: 33.8 g/dL (ref 32.0–36.0)
MCV: 85 fL (ref 80.0–100.0)
MPV: 11 fL (ref 7.5–12.5)
Monocytes Relative: 6.9 %
Neutro Abs: 3294 {cells}/uL (ref 1500–7800)
Neutrophils Relative %: 54.9 %
Platelets: 195 10*3/uL (ref 140–400)
RBC: 5.08 10*6/uL (ref 3.80–5.10)
RDW: 15.2 % — ABNORMAL HIGH (ref 11.0–15.0)
Total Lymphocyte: 36.8 %
WBC: 6 10*3/uL (ref 3.8–10.8)

## 2023-05-26 LAB — LIPASE: Lipase: 38 U/L (ref 7–60)

## 2023-05-29 NOTE — Progress Notes (Deleted)
Jamie [name], female    DOB: [birth year]   MRN: [id number]   Brief patient profile:  71  *** self referred to pulmonary clinic 05/30/2023  for ***    had been seeing Dr Jamie [name]     History of Present Illness  05/30/2023  Pulmonary/ 1st office eval/Jamie [name]  No chief complaint on file.    Dyspnea:  *** Cough: *** Sleep: *** SABA use:     Outpatient Medications Prior to Visit  Medication Sig Dispense Refill   ACCU-CHEK GUIDE test strip      ARIPiprazole (ABILIFY) 20 MG tablet Take 1 tablet (20 mg total) by mouth daily. 90 tablet 1   buPROPion (WELLBUTRIN XL) 150 MG 24 hr tablet Take 3 tablets (450 mg total) by mouth daily. 90 tablet 5   clonazePAM (KLONOPIN) 1 MG tablet Take 1 mg in the morning and 2 mg at night 90 tablet 4   Continuous Glucose Sensor (DEXCOM G7 SENSOR) MISC Change sensor every 10 days. Dx E11.65 9 each 0   DULoxetine (CYMBALTA) 60 MG capsule Take 2 capsules (120 mg total) by mouth at bedtime. 60 capsule 4   fenofibrate micronized (LOFIBRA) 134 MG capsule Take 1 capsule (134 mg total) by mouth daily. 90 capsule 2   fluticasone (FLONASE) 50 MCG/ACT nasal spray Place 1 spray into both nostrils at bedtime as needed for allergies or rhinitis.      Fluticasone-Umeclidin-Vilant (TRELEGY ELLIPTA) 100-62.5-25 MCG/ACT AEPB Inhale 1 puff into the lungs daily. 28 each 5   furosemide (LASIX) 20 MG tablet Take 1 tablet (20 mg total) by mouth daily. 30 tablet 2   gabapentin (NEURONTIN) 300 MG capsule Take 600 mg by mouth at bedtime.     ibuprofen (ADVIL) 600 MG tablet Take 600 mg by mouth every 4 (four) hours as needed.     insulin glargine (LANTUS SOLOSTAR) 100 UNIT/ML Solostar Pen Inject 30 Units into the skin at bedtime. 30 mL 3   insulin lispro (HUMALOG) 100 UNIT/ML KwikPen Inject 8-14 Units into the skin 3 (three) times daily. 36 mL 3   Insulin Pen Needle (CAREFINE PEN NEEDLES) 31G X 6 MM MISC Use to inject insulin 4 times daily 300 each 3   lisinopril (PRINIVIL,ZESTRIL) 5  MG tablet Take 5 mg by mouth daily.     metFORMIN (GLUCOPHAGE-XR) 500 MG 24 hr tablet Take 1 tablet (500 mg total) by mouth daily with breakfast. 90 tablet 3   metoprolol tartrate (LOPRESSOR) 25 MG tablet Take 1 tablet (25 mg total) by mouth 2 (two) times daily. 180 tablet 1   NON FORMULARY Pt uses a cpap nightly     omega-3 acid ethyl esters (LOVAZA) 1 g capsule Take 2 g by mouth 2 (two) times daily.     omeprazole (PRILOSEC) 40 MG capsule Take 1 capsule (40 mg total) by mouth daily. 30 capsule 11   ondansetron (ZOFRAN-ODT) 4 MG disintegrating tablet TAKE 1 TABLET BY MOUTH EVERY 8 HOURS AS NEEDED FOR NAUSEA AND VOMITING (Patient taking differently: No sig reported) 90 tablet 3   Pancrelipase, Lip-Prot-Amyl, (ZENPEP) 40000-126000 units CPEP TAKE TWO CAPSULES BY MOUTH THREE TIMES DAILY WITH MEALS AND 1 CAPSULE WITH AT least TWO snacks 270 capsule 3   rosuvastatin (CRESTOR) 10 MG tablet Take 1 tablet (10 mg total) by mouth at bedtime. 90 tablet 1   traZODone (DESYREL) 100 MG tablet Take 4 tablets (400 mg total) by mouth at bedtime. 360 tablet 1   No facility-administered medications prior to  visit.    Past Medical History:  Diagnosis Date   Anxiety    Arthritis    knees   Bipolar 1 disorder (HCC)    Chronic diarrhea    Diverticulosis of colon    GERD (gastroesophageal reflux disease)    Hiatal hernia    History of concussion    per pt 11/ 2020 w/ no residual   Hyperlipidemia    Hypertension    followed by pcp   (11-29-2019  per pt stated never had stress test)   MDD (major depressive disorder)    Migraine    neurologist--- dr Terrace Arabia   OSA on CPAP    followed by dr dohmeier   PCOS (polycystic ovarian syndrome)    PONV (postoperative nausea and vomiting)    Type 2 diabetes mellitus (HCC)    followed by pcp   (11-29-2019  pt stated checks blood sugar daily in am,  fasting sugar--- 96      Objective:     LMP  (LMP Unknown)          Assessment   No problem-specific  Assessment & Plan notes found for this encounter.     Jamie Hughs, MD 05/29/2023

## 2023-05-30 ENCOUNTER — Ambulatory Visit: Payer: Medicare Other | Admitting: Internal Medicine

## 2023-05-30 DIAGNOSIS — R509 Fever, unspecified: Secondary | ICD-10-CM | POA: Diagnosis not present

## 2023-05-31 ENCOUNTER — Encounter: Payer: Self-pay | Admitting: *Deleted

## 2023-05-31 ENCOUNTER — Other Ambulatory Visit: Payer: Self-pay | Admitting: *Deleted

## 2023-05-31 DIAGNOSIS — R1032 Left lower quadrant pain: Secondary | ICD-10-CM

## 2023-05-31 DIAGNOSIS — Z8719 Personal history of other diseases of the digestive system: Secondary | ICD-10-CM

## 2023-06-01 ENCOUNTER — Ambulatory Visit (HOSPITAL_COMMUNITY): Payer: Medicare Other

## 2023-06-04 ENCOUNTER — Emergency Department (HOSPITAL_COMMUNITY): Payer: Medicare Other

## 2023-06-04 ENCOUNTER — Other Ambulatory Visit: Payer: Self-pay

## 2023-06-04 ENCOUNTER — Emergency Department (HOSPITAL_COMMUNITY)
Admission: EM | Admit: 2023-06-04 | Discharge: 2023-06-04 | Disposition: A | Discharge status: Home / Self Care | Payer: Medicare Other | Attending: Emergency Medicine | Admitting: Emergency Medicine

## 2023-06-04 DIAGNOSIS — Z794 Long term (current) use of insulin: Secondary | ICD-10-CM | POA: Insufficient documentation

## 2023-06-04 DIAGNOSIS — R739 Hyperglycemia, unspecified: Secondary | ICD-10-CM

## 2023-06-04 DIAGNOSIS — R6889 Other general symptoms and signs: Secondary | ICD-10-CM | POA: Diagnosis not present

## 2023-06-04 DIAGNOSIS — R0602 Shortness of breath: Secondary | ICD-10-CM | POA: Diagnosis not present

## 2023-06-04 DIAGNOSIS — E1165 Type 2 diabetes mellitus with hyperglycemia: Secondary | ICD-10-CM | POA: Diagnosis not present

## 2023-06-04 DIAGNOSIS — R059 Cough, unspecified: Secondary | ICD-10-CM | POA: Diagnosis not present

## 2023-06-04 DIAGNOSIS — U071 COVID-19: Secondary | ICD-10-CM | POA: Diagnosis not present

## 2023-06-04 DIAGNOSIS — I10 Essential (primary) hypertension: Secondary | ICD-10-CM | POA: Diagnosis not present

## 2023-06-04 DIAGNOSIS — Z79899 Other long term (current) drug therapy: Secondary | ICD-10-CM | POA: Insufficient documentation

## 2023-06-04 DIAGNOSIS — Z7984 Long term (current) use of oral hypoglycemic drugs: Secondary | ICD-10-CM | POA: Insufficient documentation

## 2023-06-04 DIAGNOSIS — Z743 Need for continuous supervision: Secondary | ICD-10-CM | POA: Diagnosis not present

## 2023-06-04 LAB — BASIC METABOLIC PANEL
Anion gap: 16 — ABNORMAL HIGH (ref 5–15)
Anion gap: 12 (ref 5–15)
BUN: 27 mg/dL — ABNORMAL HIGH (ref 6–20)
BUN: 25 mg/dL — ABNORMAL HIGH (ref 6–20)
CO2: 20 mmol/L — ABNORMAL LOW (ref 22–32)
CO2: 24 mmol/L (ref 22–32)
Calcium: 9.4 mg/dL (ref 8.9–10.3)
Calcium: 8.9 mg/dL (ref 8.9–10.3)
Chloride: 100 mmol/L (ref 98–111)
Chloride: 99 mmol/L (ref 98–111)
Creatinine, Ser: 0.92 mg/dL (ref 0.44–1.00)
Creatinine, Ser: 0.84 mg/dL (ref 0.44–1.00)
GFR, Estimated: >60 mL/min (ref >60)
GFR, Estimated: >60 mL/min (ref >60)
Glucose, Bld: 421 mg/dL — ABNORMAL HIGH (ref 70–99)
Glucose, Bld: 274 mg/dL — ABNORMAL HIGH (ref 70–99)
Potassium: 4.1 mmol/L (ref 3.5–5.1)
Potassium: 4 mmol/L (ref 3.5–5.1)
Sodium: 136 mmol/L (ref 135–145)
Sodium: 135 mmol/L (ref 135–145)

## 2023-06-04 LAB — CBC
HCT: 42.5 % (ref 36.0–46.0)
Hemoglobin: 14.3 g/dL (ref 12.0–15.0)
MCH: 28.9 pg (ref 26.0–34.0)
MCHC: 33.6 g/dL (ref 30.0–36.0)
MCV: 86 fL (ref 80.0–100.0)
Platelets: 167 10*3/uL (ref 150–400)
RBC: 4.94 MIL/uL (ref 3.87–5.11)
RDW: 14.8 % (ref 11.5–15.5)
WBC: 5.5 10*3/uL (ref 4.0–10.5)
nRBC: 0 % (ref 0.0–0.2)

## 2023-06-04 LAB — TROPONIN I (HIGH SENSITIVITY): Troponin I (High Sensitivity): <2 ng/L (ref <18)

## 2023-06-04 LAB — CBG MONITORING, ED: Glucose-Capillary: 290 mg/dL — ABNORMAL HIGH (ref 70–99)

## 2023-06-04 MED ORDER — SODIUM CHLORIDE 0.9 % IV BOLUS
1000.0000 mL | Freq: Once | INTRAVENOUS | Status: AC
Start: 2023-06-04 — End: 2023-06-04
  Administered 2023-06-04: 1000 mL via INTRAVENOUS

## 2023-06-04 MED ORDER — IPRATROPIUM-ALBUTEROL 0.5-2.5 (3) MG/3ML IN SOLN
3.0000 mL | Freq: Once | RESPIRATORY_TRACT | Status: AC
  Administered 2023-06-04: 3 mL via RESPIRATORY_TRACT
  Filled 2023-06-04: qty 3

## 2023-06-04 MED ORDER — BENZONATATE 100 MG PO CAPS: 100.0000 mg | ORAL_CAPSULE | Freq: Three times a day (TID) | ORAL | 0 refills | Status: DC

## 2023-06-04 MED ORDER — INSULIN ASPART 100 UNIT/ML IV SOLN
10.0000 [IU] | Freq: Once | INTRAVENOUS | Status: AC
  Administered 2023-06-04: 10 [IU] via INTRAVENOUS

## 2023-06-04 MED ORDER — ALBUTEROL SULFATE HFA 108 (90 BASE) MCG/ACT IN AERS: 2.0000 | INHALATION_SPRAY | RESPIRATORY_TRACT | 3 refills | Status: AC | PRN

## 2023-06-04 NOTE — Discharge Instructions (Addendum)
Your testing is reassuring except for your blood sugar - you should not eat cupcakes as this will cause your blood sugar to go up significantly.  Your x-ray and blood work was all reassuring, and although you do have COVID-19 there is no signs of pneumonia.  Your blood sugar has improved significantly after insulin, see your doctor as needed, use the albuterol 2 puffs every 4 hours as needed, Tessalon every 8 hours as needed for coughing.  ER for worsening symptoms

## 2023-06-04 NOTE — ED Provider Notes (Signed)
EMERGENCY DEPARTMENT AT [facility] Provider Note   CSN: [id number] Arrival date & time: 06/04/23  1510     History  Chief Complaint  Patient presents with   Shortness of Breath    Jamie [name] is a 52 y.o. female.   Shortness of Breath  This patient is a 52 year old female, she denies a history of some type of restrictive airway disease as described in the pulmonology notes from May of this year.  She has never smoked, she had been having some shortness of breath ever since developing pancreatitis and having multiple episodes of COVID-19.  The patient states that she does not smoke or drink, she is a diabetic on insulin, she does have hypertension high cholesterol.  She is obese, she was in her usual state of health until about 6 days ago when she developed increasing sore throat, she was tested for COVID-19 on Tuesday and it was positive, since that time she has had improvement in her sore throat but developed a bit of a shortness of breath and a cough.  She denies fevers or chills but today was feeling a little bit clammy, paramedics noted that she was 97% on their arrival, no medications given prehospital.  She was placed on 2 L for comfort.  She does use CPAP at night for obstructive sleep apnea and has a history of chronic intermittent wheezing the patient denies any swelling    Home Medications Prior to Admission medications   Medication Sig Start Date End Date Taking? Authorizing Provider  albuterol (VENTOLIN HFA) 108 (90 Base) MCG/ACT inhaler Inhale 2 puffs into the lungs every 4 (four) hours as needed for wheezing or shortness of breath. 06/04/23  Yes Eber Hong, MD  benzonatate (TESSALON) 100 MG capsule Take 1 capsule (100 mg total) by mouth every 8 (eight) hours. 06/04/23  Yes Eber Hong, MD  ACCU-CHEK GUIDE test strip  05/18/23   [provider]  ARIPiprazole (ABILIFY) 20 MG tablet Take 1 tablet (20 mg total) by mouth daily. 03/08/23    Plovsky, Earvin Hansen, MD  buPROPion (WELLBUTRIN XL) 150 MG 24 hr tablet Take 3 tablets (450 mg total) by mouth daily. 03/08/23   Plovsky, Earvin Hansen, MD  clonazePAM (KLONOPIN) 1 MG tablet Take 1 mg in the morning and 2 mg at night 03/08/23   Plovsky, Earvin Hansen, MD  Continuous Glucose Sensor (DEXCOM G7 SENSOR) MISC Change sensor every 10 days. Dx E11.65 05/25/23   Dani Gobble, NP  DULoxetine (CYMBALTA) 60 MG capsule Take 2 capsules (120 mg total) by mouth at bedtime. 03/08/23   Plovsky, Earvin Hansen, MD  fenofibrate micronized (LOFIBRA) 134 MG capsule Take 1 capsule (134 mg total) by mouth daily. 04/26/23   Mallipeddi, Vishnu P, MD  fluticasone (FLONASE) 50 MCG/ACT nasal spray Place 1 spray into both nostrils at bedtime as needed for allergies or rhinitis.     [provider]  Fluticasone-Umeclidin-Vilant (TRELEGY ELLIPTA) 100-62.5-25 MCG/ACT AEPB Inhale 1 puff into the lungs daily. 12/06/22   Martina Sinner, MD  furosemide (LASIX) 20 MG tablet Take 1 tablet (20 mg total) by mouth daily. 03/22/23   Mallipeddi, Vishnu P, MD  gabapentin (NEURONTIN) 600 MG tablet Take 600 mg by mouth at bedtime. 05/18/23   [provider]  ibuprofen (ADVIL) 600 MG tablet Take 600 mg by mouth every 4 (four) hours as needed. 12/13/22   [provider]  insulin glargine (LANTUS SOLOSTAR) 100 UNIT/ML Solostar Pen Inject 30 Units into the skin at  bedtime. 03/02/23   Dani Gobble, NP  insulin lispro (HUMALOG) 100 UNIT/ML KwikPen Inject 8-14 Units into the skin 3 (three) times daily. 03/02/23   Dani Gobble, NP  Insulin Pen Needle (CAREFINE PEN NEEDLES) 31G X 6 MM MISC Use to inject insulin 4 times daily 03/02/23   Dani Gobble, NP  lisinopril (PRINIVIL,ZESTRIL) 5 MG tablet Take 5 mg by mouth daily.    [provider]  metFORMIN (GLUCOPHAGE-XR) 500 MG 24 hr tablet Take 1 tablet (500 mg total) by mouth daily with breakfast. 03/02/23   Dani Gobble, NP  metoprolol tartrate (LOPRESSOR) 25 MG  tablet Take 1 tablet (25 mg total) by mouth 2 (two) times daily. 03/22/23   Mallipeddi, Vishnu P, MD  NON FORMULARY Pt uses a cpap nightly    [provider]  omega-3 acid ethyl esters (LOVAZA) 1 g capsule Take 2 g by mouth 2 (two) times daily. 10/22/22   [provider]  omeprazole (PRILOSEC) 40 MG capsule Take 1 capsule (40 mg total) by mouth daily. 02/18/23   Rourk, Gerrit Friends, MD  ondansetron (ZOFRAN-ODT) 4 MG disintegrating tablet TAKE 1 TABLET BY MOUTH EVERY 8 HOURS AS NEEDED FOR NAUSEA AND VOMITING Patient taking differently: No sig reported 08/31/22   Rourk, Gerrit Friends, MD  Pancrelipase, Lip-Prot-Amyl, (ZENPEP) 40000-126000 units CPEP TAKE TWO CAPSULES BY MOUTH THREE TIMES DAILY WITH MEALS AND 1 CAPSULE WITH AT least TWO snacks 04/05/23   Rourk, Gerrit Friends, MD  rosuvastatin (CRESTOR) 10 MG tablet Take 1 tablet (10 mg total) by mouth at bedtime. 04/26/23   Mallipeddi, Vishnu P, MD  traZODone (DESYREL) 100 MG tablet Take 4 tablets (400 mg total) by mouth at bedtime. 03/08/23   Plovsky, Earvin Hansen, MD      Allergies    Codeine, Ozempic (0.25 or 0.5 mg-dose) [semaglutide(0.25 or 0.5mg -dos)], and Morphine and codeine    Review of Systems   Review of Systems  Respiratory:  Positive for shortness of breath.     Physical Exam Updated Vital Signs BP 97/63   Pulse 85   Temp 97.9 F (36.6 C) (Oral)   Resp 14   Ht 1.6 m (5\' 3" )   Wt 102.7 kg   LMP  (LMP Unknown)   SpO2 96%   BMI 40.10 kg/m  Physical Exam Vitals and nursing note reviewed.  Constitutional:      General: She is not in acute distress.    Appearance: She is well-developed.  HENT:     Head: Normocephalic and atraumatic.     Mouth/Throat:     Pharynx: No oropharyngeal exudate.  Eyes:     General: No scleral icterus.       Right eye: No discharge.        Left eye: No discharge.     Conjunctiva/sclera: Conjunctivae normal.     Pupils: Pupils are equal, round, and reactive to light.  Neck:     Thyroid: No  thyromegaly.     Vascular: No JVD.  Cardiovascular:     Rate and Rhythm: Normal rate and regular rhythm.     Heart sounds: Normal heart sounds. No murmur heard.    No friction rub. No gallop.  Pulmonary:     Effort: Pulmonary effort is normal. No respiratory distress.     Breath sounds: Normal breath sounds. No wheezing or rales.  Abdominal:     General: Bowel sounds are normal. There is no distension.     Palpations: Abdomen is soft. There  is no mass.     Tenderness: There is no abdominal tenderness.  Musculoskeletal:        General: No tenderness. Normal range of motion.     Cervical back: Normal range of motion and neck supple.     Right lower leg: No edema.     Left lower leg: No edema.  Lymphadenopathy:     Cervical: No cervical adenopathy.  Skin:    General: Skin is warm and dry.     Findings: No erythema or rash.  Neurological:     Mental Status: She is alert.     Coordination: Coordination normal.  Psychiatric:        Behavior: Behavior normal.     ED Results / Procedures / Treatments   Labs (all labs ordered are listed, but only abnormal results are displayed) Labs Reviewed  BASIC METABOLIC PANEL - Abnormal; Notable for the following components:      Result Value   CO2 20 (*)    Glucose, Bld 421 (*)    BUN 27 (*)    Anion gap 16 (*)    All other components within normal limits  BASIC METABOLIC PANEL - Abnormal; Notable for the following components:   Glucose, Bld 274 (*)    BUN 25 (*)    All other components within normal limits  CBG MONITORING, ED - Abnormal; Notable for the following components:   Glucose-Capillary 290 (*)    All other components within normal limits  CBC  TROPONIN I (HIGH SENSITIVITY)    EKG EKG Interpretation Date/Time:  Saturday June 04 2023 15:27:58 EST Ventricular Rate:  79 PR Interval:  189 QRS Duration:  96 QT Interval:  391 QTC Calculation: 449 R Axis:   66  Text Interpretation: Sinus rhythm Low voltage, precordial  leads Confirmed by Eber Hong ([id number]) on 06/04/2023 3:55:11 PM  Radiology DG Chest 2 View  Result Date: 06/04/2023 CLINICAL DATA:  Shortness of breath.  Cough.  COVID-19 for 7 days. EXAM: CHEST - 2 VIEW COMPARISON:  01/26/2023 acute abdomen series. FINDINGS: Lateral view degraded by patient arm position. Multiple leads and wires project over the chest on the frontal AP radiograph. Midline trachea. Normal heart size for level of inspiration. No pleural effusion or pneumothorax. No congestive failure. Clear lungs. IMPRESSION: No acute cardiopulmonary disease.  Limitations as detailed above. Electronically Signed   By: Jeronimo Greaves M.D.   On: 06/04/2023 17:13    Procedures Procedures    Medications Ordered in ED Medications  ipratropium-albuterol (DUONEB) 0.5-2.5 (3) MG/3ML nebulizer solution 3 mL (3 mLs Nebulization Given 06/04/23 1633)  sodium chloride 0.9 % bolus 1,000 mL (0 mLs Intravenous Stopped 06/04/23 1919)  insulin aspart (novoLOG) injection 10 Units (10 Units Intravenous Given 06/04/23 1738)    ED Course/ Medical Decision Making/ A&P                                 Medical Decision Making Amount and/or Complexity of Data Reviewed Labs: ordered. Radiology: ordered. ECG/medicine tests: ordered.  Risk OTC drugs. Prescription drug management.    This patient presents to the ED for concern of shortness of breath, this involves an extensive number of treatment options, and is a complaint that carries with it a high risk of complications and morbidity.  The differential diagnosis includes pneumonia, coronary disease, congestive heart failure, pulmonary embolism, viral   Co morbidities that complicate the patient evaluation  Obese,  obstructive sleep apnea, history of reactive airway disease   Additional history obtained:  Additional history obtained from medical record External records from outside source obtained and reviewed including office notes from  pulmonology   Lab Tests:  I Ordered, and personally interpreted labs.  The pertinent results include:  CBC and BMP without acute findings / other than CBG which was up and CO2 which was low - with AG acidosis (mild)   Imaging Studies ordered:  I ordered imaging studies including x-ray I independently visualized and interpreted imaging which showed clear lungs I agree with the radiologist interpretation   Cardiac Monitoring: / EKG:  The patient was maintained on a cardiac monitor.  I personally viewed and interpreted the cardiac monitored which showed an underlying rhythm of: Sinus rhythm     Problem List / ED Course / Critical interventions / Medication management  Patient with what appears to be a respiratory illness which is mild, oxygenation has been normal, there has been no fever tachycardia or hypoxia of concern. I ordered medication including IV fluids and insulin for high glycemia Reevaluation of the patient after these medicines showed that the patient improved, no signs of DKA at discharge I have reviewed the patients home medicines and have made adjustments as needed   Social Determinants of Health:  Well-appearing, no lung disease, obstructive sleep apnea   Test / Admission - Considered:  Patient considered for admission but has totally normal vitals and is feeling much better, she is stable for discharge not needing supplemental oxygen         Final Clinical Impression(s) / ED Diagnoses Final diagnoses:  Hyperglycemia  COVID-19  SOB (shortness of breath)    Rx / DC Orders ED Discharge Orders          Ordered    albuterol (VENTOLIN HFA) 108 (90 Base) MCG/ACT inhaler  Every 4 hours PRN        06/04/23 2019    benzonatate (TESSALON) 100 MG capsule  Every 8 hours        06/04/23 2019              Eber Hong, MD 06/04/23 2023

## 2023-06-04 NOTE — ED Triage Notes (Signed)
Pt arrived via [facility] from home c/o shob since last Tuesday, pt was Dx/ tested positive  covid last Tuesday. Endorses cough, denies CP. SpO2 96% RA

## 2023-06-07 ENCOUNTER — Other Ambulatory Visit (HOSPITAL_COMMUNITY): Payer: Self-pay | Admitting: Psychiatry

## 2023-06-07 ENCOUNTER — Ambulatory Visit (HOSPITAL_COMMUNITY): Payer: Medicare Other

## 2023-06-07 ENCOUNTER — Emergency Department (HOSPITAL_COMMUNITY)
Admission: EM | Admit: 2023-06-07 | Discharge: 2023-06-08 | Disposition: A | Discharge status: Home / Self Care | Payer: Medicare Other | Attending: Student | Admitting: Student

## 2023-06-07 ENCOUNTER — Other Ambulatory Visit: Payer: Self-pay | Admitting: Internal Medicine

## 2023-06-07 ENCOUNTER — Ambulatory Visit: Payer: Medicare Other | Admitting: Nurse Practitioner

## 2023-06-07 ENCOUNTER — Other Ambulatory Visit: Payer: Self-pay

## 2023-06-07 DIAGNOSIS — M791 Myalgia, unspecified site: Secondary | ICD-10-CM

## 2023-06-07 DIAGNOSIS — R519 Headache, unspecified: Secondary | ICD-10-CM

## 2023-06-07 DIAGNOSIS — I110 Hypertensive heart disease with heart failure: Secondary | ICD-10-CM | POA: Insufficient documentation

## 2023-06-07 DIAGNOSIS — Z794 Long term (current) use of insulin: Secondary | ICD-10-CM | POA: Diagnosis not present

## 2023-06-07 DIAGNOSIS — Z7984 Long term (current) use of oral hypoglycemic drugs: Secondary | ICD-10-CM | POA: Diagnosis not present

## 2023-06-07 DIAGNOSIS — I5032 Chronic diastolic (congestive) heart failure: Secondary | ICD-10-CM | POA: Diagnosis not present

## 2023-06-07 DIAGNOSIS — Z79899 Other long term (current) drug therapy: Secondary | ICD-10-CM | POA: Insufficient documentation

## 2023-06-07 DIAGNOSIS — F323 Major depressive disorder, single episode, severe with psychotic features: Secondary | ICD-10-CM

## 2023-06-07 DIAGNOSIS — R0981 Nasal congestion: Secondary | ICD-10-CM | POA: Diagnosis not present

## 2023-06-07 DIAGNOSIS — E119 Type 2 diabetes mellitus without complications: Secondary | ICD-10-CM | POA: Diagnosis not present

## 2023-06-07 DIAGNOSIS — U071 COVID-19: Secondary | ICD-10-CM | POA: Diagnosis not present

## 2023-06-07 NOTE — ED Triage Notes (Signed)
Pt states she was diagnosed with COVID on Tuesday. C/o headache and body aches. Reports she does not have any Tessalon capsules left she was prescribed, has not taken any other OTC medication for symptoms.

## 2023-06-08 ENCOUNTER — Ambulatory Visit (HOSPITAL_COMMUNITY): Payer: Medicare Other | Admitting: Psychiatry

## 2023-06-08 MED ORDER — BENZONATATE 100 MG PO CAPS
200.0000 mg | ORAL_CAPSULE | Freq: Once | ORAL | Status: AC
  Administered 2023-06-08: 200 mg via ORAL
  Filled 2023-06-08: qty 2

## 2023-06-08 MED ORDER — SUDAFED SINUS 12HR PRESS+PAIN 120-220 MG PO TB12: 120.0000 mg | ORAL_TABLET | Freq: Two times a day (BID) | ORAL | 0 refills | Status: DC

## 2023-06-08 MED ORDER — NAPROXEN 375 MG PO TABS
375.0000 mg | ORAL_TABLET | Freq: Two times a day (BID) | ORAL | 0 refills | Status: DC
Start: 2023-06-08 — End: 2023-08-09

## 2023-06-08 MED ORDER — KETOROLAC TROMETHAMINE 15 MG/ML IJ SOLN
15.0000 mg | Freq: Once | INTRAMUSCULAR | Status: AC
  Administered 2023-06-08: 15 mg via INTRAMUSCULAR
  Filled 2023-06-08: qty 1

## 2023-06-08 NOTE — ED Provider Notes (Signed)
[place] EMERGENCY DEPARTMENT AT [facility] Provider Note  CSN: [id number] Arrival date & time: 06/07/23 1854  Chief Complaint(s) No chief complaint on file.  HPI Jamie [name] is a 52 y.o. female with PMH anxiety, bipolar 1, PCOS, T2DM, hiatal hernia who presents emergency department for evaluation of myalgias and nasal congestion with headache in the setting of a known COVID-19 infection.  She tested positive for COVID-19 on Tuesday 11/5 and has had persistent symptoms.  She was seen on 06/04/2023 for cough and shortness of breath with overall negative workup outside of some hyperglycemia.  She states that her shortness of breath has improved but she is having persistent headache and nasal congestion with persistent myalgias.  She denies current shortness of breath, abdominal pain, nausea, vomiting, fever or other systemic symptoms.   Past Medical History Past Medical History:  Diagnosis Date   Anxiety    Arthritis    knees   Bipolar 1 disorder (HCC)    Chronic diarrhea    Diverticulosis of colon    GERD (gastroesophageal reflux disease)    Hiatal hernia    History of concussion    per pt 11/ 2020 w/ no residual   Hyperlipidemia    Hypertension    followed by pcp   (11-29-2019  per pt stated never had stress test)   MDD (major depressive disorder)    Migraine    neurologist--- dr Terrace Arabia   OSA on CPAP    followed by dr dohmeier   PCOS (polycystic ovarian syndrome)    PONV (postoperative nausea and vomiting)    Type 2 diabetes mellitus (HCC)    followed by pcp   (11-29-2019  pt stated checks blood sugar daily in am,  fasting sugar--- 96   Patient Active Problem List   Diagnosis Date Noted   Atypical chest pain 04/26/2023   Hypertriglyceridemia without hypercholesterolemia 01/28/2023   DM2 (diabetes mellitus, type 2) (HCC) 01/26/2023   Acute pancreatitis 01/26/2023   Chronic diastolic (congestive) heart failure (HCC) 05/04/2022   Class 2 obesity with body  mass index (BMI) of 38.0 to 38.9 in adult 05/04/2022   Chronic pancreatitis (HCC) 05/03/2022   Xerostomia 11/06/2020   Pneumonia due to COVID-19 virus 09/06/2020   GERD (gastroesophageal reflux disease) 03/20/2020   Dysphagia 03/20/2020   Wheezing 02/22/2020   Prolapsed internal hemorrhoids, grade 4 12/05/2019   Abdominal pain, chronic, epigastric 09/18/2019   Chronic diarrhea 06/12/2019   Common migraine 11/03/2016   OSA on CPAP 11/03/2016   Essential hypertension 02/01/2014   Lumbar herniated disc 01/22/2014   Headache 12/06/2012   Bipolar I disorder, severe, current or most recent episode depressed, with psychotic features (HCC) 11/15/2012   Severe major depression with psychotic features, mood-congruent (HCC) 05/03/2012   Generalized anxiety disorder 06/09/2011   Gastroesophageal reflux disease 12/22/2010   Home Medication(s) Prior to Admission medications   Medication Sig Start Date End Date Taking? Authorizing Provider  naproxen (NAPROSYN) 375 MG tablet Take 1 tablet (375 mg total) by mouth 2 (two) times daily. 06/08/23  Yes Burney Calzadilla, MD  Pseudoephedrine-Naproxen Na (SUDAFED SINUS 12HR PRESS+PAIN) 120-220 MG TB12 Take 120 mg by mouth 2 (two) times daily. 06/08/23  Yes Jamise Pentland, MD  ACCU-CHEK GUIDE test strip  05/18/23   [provider]  albuterol (VENTOLIN HFA) 108 (90 Base) MCG/ACT inhaler Inhale 2 puffs into the lungs every 4 (four) hours as needed for wheezing or shortness of breath. 06/04/23   Eber Hong, MD  ARIPiprazole (  ABILIFY) 20 MG tablet Take 1 tablet (20 mg total) by mouth daily. 03/08/23   Plovsky, Earvin Hansen, MD  benzonatate (TESSALON) 100 MG capsule Take 1 capsule (100 mg total) by mouth every 8 (eight) hours. 06/04/23   Eber Hong, MD  buPROPion (WELLBUTRIN XL) 150 MG 24 hr tablet Take 3 tablets (450 mg total) by mouth daily. 03/08/23   Plovsky, Earvin Hansen, MD  clonazePAM (KLONOPIN) 1 MG tablet Take 1 mg in the morning and 2 mg at night 03/08/23    Plovsky, Earvin Hansen, MD  Continuous Glucose Sensor (DEXCOM G7 SENSOR) MISC Change sensor every 10 days. Dx E11.65 05/25/23   Dani Gobble, NP  DULoxetine (CYMBALTA) 60 MG capsule Take 2 capsules (120 mg total) by mouth at bedtime. 03/08/23   Plovsky, Earvin Hansen, MD  fenofibrate micronized (LOFIBRA) 134 MG capsule Take 1 capsule (134 mg total) by mouth daily. 04/26/23   Mallipeddi, Vishnu P, MD  fluticasone (FLONASE) 50 MCG/ACT nasal spray Place 1 spray into both nostrils at bedtime as needed for allergies or rhinitis.     [provider]  Fluticasone-Umeclidin-Vilant (TRELEGY ELLIPTA) 100-62.5-25 MCG/ACT AEPB Inhale 1 puff into the lungs daily. 12/06/22   Martina Sinner, MD  furosemide (LASIX) 20 MG tablet TAKE 1 TABLET BY MOUTH DAILY 06/07/23   Mallipeddi, Vishnu P, MD  gabapentin (NEURONTIN) 600 MG tablet Take 600 mg by mouth at bedtime. 05/18/23   [provider]  ibuprofen (ADVIL) 600 MG tablet Take 600 mg by mouth every 4 (four) hours as needed. 12/13/22   [provider]  insulin glargine (LANTUS SOLOSTAR) 100 UNIT/ML Solostar Pen Inject 30 Units into the skin at bedtime. 03/02/23   Dani Gobble, NP  insulin lispro (HUMALOG) 100 UNIT/ML KwikPen Inject 8-14 Units into the skin 3 (three) times daily. 03/02/23   Dani Gobble, NP  Insulin Pen Needle (CAREFINE PEN NEEDLES) 31G X 6 MM MISC Use to inject insulin 4 times daily 03/02/23   Dani Gobble, NP  lisinopril (PRINIVIL,ZESTRIL) 5 MG tablet Take 5 mg by mouth daily.    [provider]  metFORMIN (GLUCOPHAGE-XR) 500 MG 24 hr tablet Take 1 tablet (500 mg total) by mouth daily with breakfast. 03/02/23   Dani Gobble, NP  metoprolol tartrate (LOPRESSOR) 25 MG tablet Take 1 tablet (25 mg total) by mouth 2 (two) times daily. 03/22/23   Mallipeddi, Vishnu P, MD  NON FORMULARY Pt uses a cpap nightly    [provider]  omega-3 acid ethyl esters (LOVAZA) 1 g capsule Take 2 g by mouth 2 (two) times  daily. 10/22/22   [provider]  omeprazole (PRILOSEC) 40 MG capsule Take 1 capsule (40 mg total) by mouth daily. 02/18/23   Rourk, Gerrit Friends, MD  ondansetron (ZOFRAN-ODT) 4 MG disintegrating tablet TAKE 1 TABLET BY MOUTH EVERY 8 HOURS AS NEEDED FOR NAUSEA AND VOMITING Patient taking differently: No sig reported 08/31/22   Rourk, Gerrit Friends, MD  Pancrelipase, Lip-Prot-Amyl, (ZENPEP) 40000-126000 units CPEP TAKE TWO CAPSULES BY MOUTH THREE TIMES DAILY WITH MEALS AND 1 CAPSULE WITH AT least TWO snacks 04/05/23   Rourk, Gerrit Friends, MD  rosuvastatin (CRESTOR) 10 MG tablet Take 1 tablet (10 mg total) by mouth at bedtime. 04/26/23   Mallipeddi, Vishnu P, MD  traZODone (DESYREL) 100 MG tablet Take 4 tablets (400 mg total) by mouth at bedtime. 03/08/23   Archer Asa, MD  Past Surgical History Past Surgical History:  Procedure Laterality Date   ANAL FISSURE REPAIR  x3  last one  2007  approx.   BIOPSY  09/06/2019   Procedure: BIOPSY;  Surgeon: [name], MD;  Location: [facility];  Service: Endoscopy;;   COLONOSCOPY WITH PROPOFOL [name] 09/06/2019   pancolonic diverticulosis. Segmental biopsy completed with benign biopsies.    ESOPHAGOGASTRODUODENOSCOPY (EGD) WITH PROPOFOL [name] 06/16/2020   Dr. [name]: normal esophagus s/p dilation, normal stomach, duodenum.    ESOPHAGOGASTRODUODENOSCOPY (EGD) WITH PROPOFOL [name] 02/10/2022   Procedure: ESOPHAGOGASTRODUODENOSCOPY (EGD) WITH PROPOFOL;  Surgeon: [name], MD;  Location: [facility];  Service: Endoscopy;  Laterality: [name];  9:45am   EVALUATION UNDER ANESTHESIA WITH HEMORRHOIDECTOMY [name] 12/05/2019   Procedure: ANORECTAL EXAM UNDER ANESTHESIA WITH HEMORRHOIDECTOMY, HEMORRHOIDAL LIGATION/PEXY;  Surgeon: [name], MD;  Location: [facility];  Service: General;  Laterality: [name];  GENERAL AND LOCAL    EVALUATION UNDER ANESTHESIA WITH TEAR DUCT PROBING  infant   KNEE ARTHROSCOPY Right 1994   LAPAROSCOPIC CHOLECYSTECTOMY  08-06-2002  @[facility]    LUMBAR LAMINECTOMY/DECOMPRESSION MICRODISCECTOMY Right 01/22/2014   Procedure: LUMBAR FOUR TO FIVE LUMBAR LAMINECTOMY/DECOMPRESSION MICRODISCECTOMY 1 LEVEL;  Surgeon: Karn Cassis, MD;  Location: [facility];  Service: Neurosurgery;  Laterality: Right;  Right L45 diskectomy   MALONEY DILATION [name] 06/16/2020   Procedure: Elease [name] DILATION;  Surgeon: [name], MD;  Location: [facility];  Service: Endoscopy;  Laterality: [name];   MALONEY DILATION [name] 02/10/2022   Procedure: Elease [name] DILATION;  Surgeon: [name], MD;  Location: [facility];  Service: Endoscopy;  Laterality: [name];   Family History Family History  Problem Relation Age of Onset   COPD Father    Anxiety disorder Maternal Grandmother    Depression Maternal Grandmother    Colon cancer Neg Hx    Colon polyps Neg Hx     Social History Social History   Tobacco Use   Smoking status: Never   Smokeless tobacco: Never  Vaping Use   Vaping status: Never Used  Substance Use Topics   Alcohol use: No   Drug use: Never   Allergies Codeine, Ozempic (0.25 or 0.5 mg-dose) [semaglutide(0.25 or 0.5mg -dos)], and Morphine and codeine  Review of Systems Review of Systems  Musculoskeletal:  Positive for myalgias.  Neurological:  Positive for headaches.    Physical Exam Vital Signs  I have reviewed the triage vital signs BP 111/76   Pulse 99   Temp 98.6 F (37 C) (Oral)   Resp 17   LMP  (LMP Unknown)   SpO2 94%   Physical Exam Vitals and nursing note reviewed.  Constitutional:      General: She is not in acute distress.    Appearance: She is well-developed.  HENT:     Head: Normocephalic and atraumatic.  Eyes:     Conjunctiva/sclera: Conjunctivae normal.  Cardiovascular:     Rate and Rhythm: Normal rate and regular rhythm.     Heart sounds: No murmur  heard. Pulmonary:     Effort: Pulmonary effort is normal. No respiratory distress.     Breath sounds: Normal breath sounds.  Abdominal:     Palpations: Abdomen is soft.     Tenderness: There is no abdominal tenderness.  Musculoskeletal:        General: No swelling.     Cervical back: Neck supple.  Skin:    General: Skin is warm and dry.     Capillary Refill:  Capillary refill takes less than 2 seconds.  Neurological:     Mental Status: She is alert.  Psychiatric:        Mood and Affect: Mood normal.     ED Results and Treatments Labs (all labs ordered are listed, but only abnormal results are displayed) Labs Reviewed - No data to display                                                                                                                        Radiology No results found.  Pertinent labs & imaging results that were available during my care of the patient were reviewed by me and considered in my medical decision making (see MDM for details).  Medications Ordered in ED Medications  benzonatate (TESSALON) capsule 200 mg (200 mg Oral Given 06/08/23 0158)  ketorolac (TORADOL) 15 MG/ML injection 15 mg (15 mg Intramuscular Given 06/08/23 0159)                                                                                                                                     Procedures Procedures  (including critical care time)  Medical Decision Making / ED Course   This patient presents to the ED for concern of headache, myalgias, nasal congestion, this involves an extensive number of treatment options, and is a complaint that carries with it a high risk of complications and morbidity.  The differential diagnosis includes COVID-19, upper respiratory infection, bacterial superinfection, dehydration, sleep apnea  MDM: Patient seen emergency room for evaluation of multiple complaints described above.  Physical exam is unremarkable with no appreciable wheezing on lung  exam, neurologic exam unremarkable with no focal motor or sensory deficits.  Patient given Tessalon Perles and Toradol and on reevaluation her myalgias and headache have resolved.  Patient had a full laboratory evaluation 4 days ago and her current laboratory evaluation likely would not change my management in the setting of isolated headache and nasal congestion in the setting of a known COVID-19 infection.  Thus additional laboratory evaluation deferred at this time.  With normal neurologic exam, will defer advanced imaging at this time for headache.  Vital signs are stable and she currently does not meet inpatient criteria for admission.  Patient discharged with outpatient follow-up and return precautions.  Naprosyn and Sudafed ordered for the patient.   Additional history obtained:  -External records from outside source obtained and  reviewed including: Chart review including previous notes, labs, imaging, consultation notes   Medicines ordered and prescription drug management: Meds ordered this encounter  Medications   benzonatate (TESSALON) capsule 200 mg   ketorolac (TORADOL) 15 MG/ML injection 15 mg   naproxen (NAPROSYN) 375 MG tablet    Sig: Take 1 tablet (375 mg total) by mouth 2 (two) times daily.    Dispense:  20 tablet    Refill:  0   Pseudoephedrine-Naproxen Na (SUDAFED SINUS 12HR PRESS+PAIN) 120-220 MG TB12    Sig: Take 120 mg by mouth 2 (two) times daily.    Dispense:  20 tablet    Refill:  0    -I have reviewed the patients home medicines and have made adjustments as needed  Critical interventions none   Cardiac Monitoring: The patient was maintained on a cardiac monitor.  I personally viewed and interpreted the cardiac monitored which showed an underlying rhythm of: NSR  Social Determinants of Health:  Factors impacting patients care include: none   Reevaluation: After the interventions noted above, I reevaluated the patient and found that they have  :improved  Co morbidities that complicate the patient evaluation  Past Medical History:  Diagnosis Date   Anxiety    Arthritis    knees   Bipolar 1 disorder (HCC)    Chronic diarrhea    Diverticulosis of colon    GERD (gastroesophageal reflux disease)    Hiatal hernia    History of concussion    per pt 11/ 2020 w/ no residual   Hyperlipidemia    Hypertension    followed by pcp   (11-29-2019  per pt stated never had stress test)   MDD (major depressive disorder)    Migraine    neurologist--- dr Terrace Arabia   OSA on CPAP    followed by dr dohmeier   PCOS (polycystic ovarian syndrome)    PONV (postoperative nausea and vomiting)    Type 2 diabetes mellitus (HCC)    followed by pcp   (11-29-2019  pt stated checks blood sugar daily in am,  fasting sugar--- 96      Dispostion: I considered admission for this patient, but at this time he does not meet inpatient criteria for admission and she will follow-up outpatient with her PCP, return precautions given which she voiced understanding     Final Clinical Impression(s) / ED Diagnoses Final diagnoses:  COVID-19  Myalgia  Nasal congestion  Acute nonintractable headache, unspecified headache type     @PCDICTATION @    Glendora Score, MD 06/08/23 [phone]

## 2023-06-14 ENCOUNTER — Telehealth: Payer: Self-pay | Admitting: *Deleted

## 2023-06-14 NOTE — Telephone Encounter (Signed)
Patient called office and left a message that she is having trouble with her blood sugars. Patient has been seen in Ed two times because of COVid. She is still positive for this.She had her PCP paged at hospital and he had her to increase her Lantus 10 units. She has been on a medicine for COVID. She has not been on Prednisone . Her blood sugars have been running 300 something to 468.Patient also shares that she has not been able to eat. She has a follow up visit tomorrow about the COVID.  Pt called with high BG readings.   Date Before breakfast Before lunch Before supper Bedtime  06/11/23                          Pt taking: Patients average is 364.Her Lantus is 30 units at bedtime but the last 2 days she has injected 40 units. Humalog 8-14 units sliding scale patient is injecting 14 units, she takes her Metformin 500 mg daily with her breakfast.

## 2023-06-14 NOTE — Telephone Encounter (Signed)
Were there any specific readings?  They didn't come through in the message.  She will most likely need more insulin in the interim but I cannot safely make those adjustments without seeing more readings.  I need readings before meals and before bed.

## 2023-06-15 DIAGNOSIS — R059 Cough, unspecified: Secondary | ICD-10-CM | POA: Diagnosis not present

## 2023-06-15 DIAGNOSIS — U071 COVID-19: Secondary | ICD-10-CM | POA: Diagnosis not present

## 2023-06-15 NOTE — Telephone Encounter (Signed)
Have her start looking at her meter before meals and before bed and write that number on a separate piece of paper so if this happens again we will have readings to go by.  But yes, she can certainly stop by and we can download her CGM

## 2023-06-15 NOTE — Telephone Encounter (Signed)
Patient was called and made aware, voice message was left.

## 2023-06-15 NOTE — Telephone Encounter (Signed)
Patient could not get the reading to come up , she stated that all she could see was the averages. Will call her back and ask that she bring her meter/CGM by office to be reviewed.
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
## 2023-06-28 ENCOUNTER — Encounter: Payer: Self-pay | Admitting: Internal Medicine

## 2023-06-28 ENCOUNTER — Ambulatory Visit: Payer: Medicare Other | Admitting: Internal Medicine

## 2023-06-28 VITALS — BP 113/72 | HR 101 | Ht 63.0 in | Wt 222.0 lb

## 2023-06-28 DIAGNOSIS — I10 Essential (primary) hypertension: Secondary | ICD-10-CM | POA: Diagnosis not present

## 2023-06-28 DIAGNOSIS — J4489 Other specified chronic obstructive pulmonary disease: Secondary | ICD-10-CM | POA: Diagnosis not present

## 2023-06-28 DIAGNOSIS — R0609 Other forms of dyspnea: Secondary | ICD-10-CM | POA: Diagnosis not present

## 2023-06-28 MED ORDER — FLUTICASONE FUROATE-VILANTEROL 100-25 MCG/ACT IN AEPB: 1.0000 | INHALATION_SPRAY | Freq: Every morning | RESPIRATORY_TRACT | 11 refills | Status: DC

## 2023-06-28 MED ORDER — VALSARTAN 80 MG PO TABS: 80.0000 mg | ORAL_TABLET | Freq: Every day | ORAL | 11 refills | Status: DC

## 2023-06-28 NOTE — Assessment & Plan Note (Signed)
Changed acei to arb 06/28/2023 due to pseudowheeze    In the best review of chronic cough to date ( NEJM 2016 [phone]) ,  ACEi are now felt to cause cough in up to  20% of pts which is a 4 fold increase from previous reports and does not include the variety of non-specific complaints we see in pulmonary clinic in pts on ACEi but previously attributed to another dx like  Copd/asthma and  include PNDS, throat and chest congestion, "bronchitis", unexplained dyspnea and noct "strangling" sensations, and hoarseness, but also  atypical /refractory GERD symptoms like dysphagia and "bad heartburn"   The only way I know  to prove this is not an "ACEi Case" is a trial off ACEi x a minimum of 6 weeks then regroup.   >>> try  diovan 80 mg one daily          Each maintenance medication was reviewed in detail including emphasizing most importantly the difference between maintenance and prns and under what circumstances the prns are to be triggered using an action plan format where appropriate.  Total time for H and P, chart review, counseling, reviewing hfa/ dpi  device(s) and generating customized AVS unique to this office visit / same day charting = with complex pt new to me

## 2023-06-28 NOTE — Patient Instructions (Addendum)
Stop lisinopril and replace with Valsartan 80  mg one half tablet daily - take the whole pill if needed to reach desired level of Blood pressure control (around 120/75 or less)   Stop Trelegy and replace BREO 100 one click am ( advair 100 twice daily could be substituted  Please schedule a follow up office visit in 6 weeks, call sooner if needed with all respiratory medications /inhalers/ solutions in hand so we can verify exactly what you are taking. This includes all medications from all doctors and over the counters

## 2023-06-28 NOTE — Assessment & Plan Note (Signed)
Onset in her 40s/ never smoker - PFTs  02/23/23 min restrictive changes / no significant obst p trelegy in am  - D/c acei 06/28/2023 due to prominent pseudowheeze   DDX of  difficult airways management almost all start with A and  include Adherence, Ace Inhibitors, Acid Reflux, Active Sinus Disease, Alpha 1 Antitripsin deficiency, Anxiety masquerading as Airways dz,  ABPA,  Allergy(esp in young), Aspiration (esp in elderly), Adverse effects of meds,  Active smoking or vaping, A bunch of PE's (a small clot burden can't cause this syndrome unless there is already severe underlying pulm or vascular dz with poor reserve) plus two Bs  = Bronchiectasis and Beta blocker use..and one C= CHF   Adherence is always the initial "prime suspect" and is a multilayered concern that requires a "trust but verify" approach in every patient - starting with knowing how to use medications, especially inhalers, correctly, keeping up with refills and understanding the fundamental difference between maintenance and prns vs those medications only taken for a very short course and then stopped and not refilled.  - return with all meds in hand using a trust but verify approach to confirm accurate Medication  Reconciliation The principal here is that until we are certain that the  patients are doing what we've asked, it makes no sense to ask them to do more.   ACEi adverse effects at the  top of the usual list of suspects and the only way to rule it out is a trial off > see a/p    ? Adverse effects of dpi > doubt it but probably doesn't need trelegy in this setting anyway so will try Breo 100 for now now and consider change to symbicort 80 2bid next ov if still pseudowheezing.   ? Acid (or non-acid) GERD > always difficult to exclude as up to 75% of pts in some series report no assoc GI/ Heartburn symptoms> rec continue max (24h)  acid suppression and diet restrictions/ reviewed     ? Anxiety /depression/ deconditioning > usually  at the bottom of this list of usual suspects but should be much higher on this pt's based on H and P and note already on psychotropics and may interfere with adherence and also interpretation of response or lack thereof to symptom management which can be quite subjective.   F/u in 6 weeks to regroup

## 2023-07-01 ENCOUNTER — Encounter (HOSPITAL_COMMUNITY): Payer: Self-pay | Admitting: Psychiatry

## 2023-07-01 ENCOUNTER — Other Ambulatory Visit: Payer: Self-pay

## 2023-07-01 ENCOUNTER — Ambulatory Visit (HOSPITAL_BASED_OUTPATIENT_CLINIC_OR_DEPARTMENT_OTHER): Payer: Medicare Other | Admitting: Psychiatry

## 2023-07-01 VITALS — BP 111/74 | HR 85 | Ht 63.0 in | Wt 224.0 lb

## 2023-07-01 DIAGNOSIS — F250 Schizoaffective disorder, bipolar type: Secondary | ICD-10-CM

## 2023-07-01 DIAGNOSIS — F323 Major depressive disorder, single episode, severe with psychotic features: Secondary | ICD-10-CM

## 2023-07-01 MED ORDER — BUPROPION HCL ER (XL) 150 MG PO TB24: 450.0000 mg | ORAL_TABLET | Freq: Every day | ORAL | 5 refills | Status: DC

## 2023-07-01 MED ORDER — DULOXETINE HCL 60 MG PO CPEP: 120.0000 mg | ORAL_CAPSULE | Freq: Every day | ORAL | 4 refills | Status: DC

## 2023-07-01 MED ORDER — CLONAZEPAM 1 MG PO TABS: ORAL_TABLET | ORAL | 4 refills | Status: DC

## 2023-07-01 MED ORDER — TRAZODONE HCL 100 MG PO TABS: 400.0000 mg | ORAL_TABLET | Freq: Every day | ORAL | 1 refills | Status: DC

## 2023-07-01 MED ORDER — ARIPIPRAZOLE 20 MG PO TABS: 20.0000 mg | ORAL_TABLET | Freq: Every day | ORAL | 1 refills | Status: DC

## 2023-07-01 NOTE — Progress Notes (Signed)
Patient ID: Jamie [name], female   DOB: 1970/08/07, 52 y.o.   MRN: [id number] [facility] MD Progress Note  07/01/2023 8:41 AM AZIA TARKINGTON  MRN:  [id number] Subjective:  Feeling well. Principal Problem: Major depression recurrent    Today the patient is seen alone in the office.  She is actually doing quite well.  She denies daily depression.  She is sleeping and eating well has good energy.  She has no problems thinking and concentrating.  She keeps a journal.  She she is that she sees [facility].  The patient's last hospitalization was in 2019 at [facility].  The patient lives with her sister her mother and her sister's boyfriend [place].  She also lives a course with her own boyfriend [name].  [name] and her getting along well.  The patient really emotionally is very stable.  She takes her medicines just as prescribed.  She has had no falls.  She does have COPD and she does have insulin-dependent diabetes.  Her last hemoglobin A1c was 8.2.  The patient denies any use of alcohol or drugs. Virtual Visit via Telephone Note  I connected with [name] on 05/13/2021 at  8:00 AM EST by telephone and verified that I am speaking with the correct person using two identifiers.  Location: Patient: home Provider: office   I discussed the limitations, risks, security and privacy concerns of performing an evaluation and management service by telephone and the availability of in person appointments. I also discussed with the patient that there may be a patient responsible charge related to this service. The patient expressed understanding and agreed to proceed.    I discussed the assessment and treatment plan with the patient. The patient was provided an opportunity to ask questions and all were answered. The patient agreed with the plan and demonstrated an understanding of the instructions.   The patient was advised to call back or seek an in-person  evaluation if the symptoms worsen or if the condition fails to improve as anticipated.  I provided 30 minutes of non-face-to-face time during this encounter.   [name], MD   Past Medical History:  Diagnosis Date   Anxiety    Arthritis    knees   Bipolar 1 disorder (HCC)    Chronic diarrhea    Diverticulosis of colon    GERD (gastroesophageal reflux disease)    Hiatal hernia    History of concussion    per pt 11/ 2020 w/ no residual   Hyperlipidemia    Hypertension    followed by pcp   (11-29-2019  per pt stated never had stress test)   MDD (major depressive disorder)    Migraine    neurologist--- dr Terrace Arabia   OSA on CPAP    followed by dr dohmeier   PCOS (polycystic ovarian syndrome)    PONV (postoperative nausea and vomiting)    Type 2 diabetes mellitus (HCC)    followed by pcp   (11-29-2019  pt stated checks blood sugar daily in am,  fasting sugar--- 96    Past Surgical History:  Procedure Laterality Date   ANAL FISSURE REPAIR  x3  last one  2007  approx.   BIOPSY  09/06/2019   Procedure: BIOPSY;  Surgeon: [name], MD;  Location: [facility];  Service: Endoscopy;;   COLONOSCOPY WITH PROPOFOL [name] 09/06/2019   pancolonic diverticulosis. Segmental biopsy completed with benign biopsies.    ESOPHAGOGASTRODUODENOSCOPY (  EGD) WITH PROPOFOL [name] 06/16/2020   Dr. [name]: normal esophagus s/p dilation, normal stomach, duodenum.    ESOPHAGOGASTRODUODENOSCOPY (EGD) WITH PROPOFOL [name] 02/10/2022   Procedure: ESOPHAGOGASTRODUODENOSCOPY (EGD) WITH PROPOFOL;  Surgeon: [name], MD;  Location: [facility];  Service: Endoscopy;  Laterality: [name];  9:45am   EVALUATION UNDER ANESTHESIA WITH HEMORRHOIDECTOMY [name] 12/05/2019   Procedure: ANORECTAL EXAM UNDER ANESTHESIA WITH HEMORRHOIDECTOMY, HEMORRHOIDAL LIGATION/PEXY;  Surgeon: [name], MD;  Location:  SURGERY CENTER;  Service: General;  Laterality: [name];  GENERAL AND LOCAL   EVALUATION UNDER ANESTHESIA WITH  TEAR DUCT PROBING  infant   KNEE ARTHROSCOPY Right 1994   LAPAROSCOPIC CHOLECYSTECTOMY  08-06-2002  @[facility]    LUMBAR LAMINECTOMY/DECOMPRESSION MICRODISCECTOMY Right 01/22/2014   Procedure: LUMBAR FOUR TO FIVE LUMBAR LAMINECTOMY/DECOMPRESSION MICRODISCECTOMY 1 LEVEL;  Surgeon: Karn Cassis, MD;  Location: [facility];  Service: Neurosurgery;  Laterality: Right;  Right L45 diskectomy   MALONEY DILATION [name] 06/16/2020   Procedure: Elease [name] DILATION;  Surgeon: [name], MD;  Location: [facility];  Service: Endoscopy;  Laterality: [name];   MALONEY DILATION [name] 02/10/2022   Procedure: Elease [name] DILATION;  Surgeon: [name], MD;  Location: [facility];  Service: Endoscopy;  Laterality: [name];   Family History:  Family History  Problem Relation Age of Onset   COPD Father    Anxiety disorder Maternal Grandmother    Depression Maternal Grandmother    Colon cancer Neg Hx    Colon polyps Neg Hx    Family Psychiatric  History:  Social History:  Social History   Substance and Sexual Activity  Alcohol Use No     Social History   Substance and Sexual Activity  Drug Use Never    Social History   Socioeconomic History   Marital status: Married    Spouse name: Not on file   Number of children: 0   Years of education: college   Highest education level: Not on file  Occupational History   Occupation: disabled  Tobacco Use   Smoking status: Never   Smokeless tobacco: Never  Vaping Use   Vaping status: Never Used  Substance and Sexual Activity   Alcohol use: No   Drug use: Never   Sexual activity: Yes    Partners: Male    Birth control/protection: None  Other Topics Concern   Not on file  Social History Narrative   Patient lives at home alone    Patient is right handed   Patient drinks soda's daily   Social Determinants of Health   Financial Resource Strain: Not on file  Food Insecurity: No Food Insecurity (01/27/2023)   Hunger Vital Sign    Worried About Running Out  of Food in the Last Year: Never true    Ran Out of Food in the Last Year: Never true  Transportation Needs: No Transportation Needs (01/27/2023)   PRAPARE - Administrator, Civil Service (Medical): No    Lack of Transportation (Non-Medical): No  Physical Activity: Not on file  Stress: Not on file  Social Connections: Not on file   Additional Social History:                         Sleep: Fair  Appetite:  Good  Current Medications: Current Outpatient Medications  Medication Sig Dispense Refill   ACCU-CHEK GUIDE test strip      albuterol (VENTOLIN HFA) 108 (90 Base) MCG/ACT inhaler Inhale 2 puffs  into the lungs every 4 (four) hours as needed for wheezing or shortness of breath. 1 each 3   Continuous Glucose Sensor (DEXCOM G7 SENSOR) MISC Change sensor every 10 days. Dx E11.65 9 each 0   fenofibrate micronized (LOFIBRA) 134 MG capsule Take 1 capsule (134 mg total) by mouth daily. 90 capsule 2   fluticasone (FLONASE) 50 MCG/ACT nasal spray Place 1 spray into both nostrils at bedtime as needed for allergies or rhinitis.      fluticasone furoate-vilanterol (BREO ELLIPTA) 100-25 MCG/ACT AEPB Inhale 1 puff into the lungs every morning. 28 each 11   furosemide (LASIX) 20 MG tablet TAKE 1 TABLET BY MOUTH DAILY 30 tablet 2   gabapentin (NEURONTIN) 600 MG tablet Take 600 mg by mouth at bedtime.     ibuprofen (ADVIL) 600 MG tablet Take 600 mg by mouth every 4 (four) hours as needed.     insulin glargine (LANTUS SOLOSTAR) 100 UNIT/ML Solostar Pen Inject 30 Units into the skin at bedtime. 30 mL 3   insulin lispro (HUMALOG) 100 UNIT/ML KwikPen Inject 8-14 Units into the skin 3 (three) times daily. 36 mL 3   Insulin Pen Needle (CAREFINE PEN NEEDLES) 31G X 6 MM MISC Use to inject insulin 4 times daily 300 each 3   metFORMIN (GLUCOPHAGE-XR) 500 MG 24 hr tablet Take 1 tablet (500 mg total) by mouth daily with breakfast. 90 tablet 3   metoprolol tartrate (LOPRESSOR) 25 MG tablet Take 1  tablet (25 mg total) by mouth 2 (two) times daily. 180 tablet 1   naproxen (NAPROSYN) 375 MG tablet Take 1 tablet (375 mg total) by mouth 2 (two) times daily. 20 tablet 0   NON FORMULARY Pt uses a cpap nightly     omega-3 acid ethyl esters (LOVAZA) 1 g capsule Take 2 g by mouth 2 (two) times daily.     omeprazole (PRILOSEC) 40 MG capsule Take 1 capsule (40 mg total) by mouth daily. 30 capsule 11   ondansetron (ZOFRAN-ODT) 4 MG disintegrating tablet TAKE 1 TABLET BY MOUTH EVERY 8 HOURS AS NEEDED FOR NAUSEA AND VOMITING (Patient taking differently: No sig reported) 90 tablet 3   Pancrelipase, Lip-Prot-Amyl, (ZENPEP) 40000-126000 units CPEP TAKE TWO CAPSULES BY MOUTH THREE TIMES DAILY WITH MEALS AND 1 CAPSULE WITH AT least TWO snacks 270 capsule 3   Pseudoephedrine-Naproxen Na (SUDAFED SINUS 12HR PRESS+PAIN) 120-220 MG TB12 Take 120 mg by mouth 2 (two) times daily. 20 tablet 0   rosuvastatin (CRESTOR) 10 MG tablet Take 1 tablet (10 mg total) by mouth at bedtime. 90 tablet 1   valsartan (DIOVAN) 80 MG tablet Take 1 tablet (80 mg total) by mouth daily. 30 tablet 11   ARIPiprazole (ABILIFY) 20 MG tablet Take 1 tablet (20 mg total) by mouth daily. 90 tablet 1   buPROPion (WELLBUTRIN XL) 150 MG 24 hr tablet Take 3 tablets (450 mg total) by mouth daily. 90 tablet 5   clonazePAM (KLONOPIN) 1 MG tablet Take 1 mg in the morning and 2 mg at night 90 tablet 4   DULoxetine (CYMBALTA) 60 MG capsule Take 2 capsules (120 mg total) by mouth at bedtime. 60 capsule 4   traZODone (DESYREL) 100 MG tablet Take 4 tablets (400 mg total) by mouth at bedtime. 360 tablet 1   No current facility-administered medications for this visit.    Lab Results: No results found for this or any previous visit (from the past 48 hour(s)).  Physical Findings: AIMS:  , ,  ,  ,  CIWA:    COWS:     Musculoskeletal: Strength & Muscle Tone: within normal limits Gait & Station: normal Patient leans: Right  Psychiatric Specialty  Exam: ROS  Blood pressure 111/74, pulse 85, height 5\' 3"  (1.6 m), weight 224 lb (101.6 kg).Body mass index is 39.68 kg/m.  General Appearance: Casual  Eye Contact::  Good  Speech:  Clear and Coherent  Volume:  Normal  Mood:Depressed  Affect:  Appropriate  Today the patient is doing very well. Thought Process:  Coherent  Orientation:  Full (Time, Place, and Person)  Thought Content:  NA and WDL  Suicidal Thoughts:  yes  Homicidal Thoughts:  No  Memory:  Immediate;   NA  Judgement:  Good  Insight:  Good  Psychomotor Activity:  NA  Concentration:  Good  Recall:  Good  Fund of Knowledge:Good  Language: Good  Akathisia:  No  Handed:  Right  AIMS (if indicated):     Assets:  Desire for Improvement  ADL's:  Intact  Cognition: WNL  Sleep:      Treatment Plan Summary: 07/01/2023, 8:41 AM  This patient's first condition diagnosis is schizoaffective disorder.  The patient takes Wellbutrin and Cymbalta.  She also takes Abilify 20 mg.  She shows no signs of tardive dyskinesia.  She takes Klonopin 1 mg 1 in the morning and 2 at night and she has insomnia and takes 400 mg of trazodone.  The patient is very stable at this time she will return to see me in 3 months.  She is looking forward to the holidays.

## 2023-07-04 ENCOUNTER — Telehealth: Payer: Self-pay | Admitting: Adult Health

## 2023-07-04 NOTE — Telephone Encounter (Signed)
Pt reschedule appointment due to having COVID.

## 2023-07-06 ENCOUNTER — Ambulatory Visit: Payer: Medicare Other | Admitting: Neurology

## 2023-07-08 ENCOUNTER — Other Ambulatory Visit: Payer: Self-pay | Admitting: Internal Medicine

## 2023-07-11 ENCOUNTER — Ambulatory Visit (HOSPITAL_COMMUNITY): Payer: Medicare Other

## 2023-07-13 DIAGNOSIS — J0101 Acute recurrent maxillary sinusitis: Secondary | ICD-10-CM | POA: Diagnosis not present

## 2023-07-22 ENCOUNTER — Telehealth: Payer: Self-pay | Admitting: *Deleted

## 2023-07-22 NOTE — Telephone Encounter (Signed)
Patient let a message asking for a call back , she needs the code to connect with our office with her CGM. I called the patient and left a voicemail asking her to call back so that we can help her.

## 2023-07-28 ENCOUNTER — Ambulatory Visit: Payer: Medicare Other | Admitting: Neurology

## 2023-07-29 ENCOUNTER — Ambulatory Visit (INDEPENDENT_AMBULATORY_CARE_PROVIDER_SITE_OTHER): Payer: Medicare Other | Admitting: Nurse Practitioner

## 2023-07-29 ENCOUNTER — Encounter: Payer: Self-pay | Admitting: Nurse Practitioner

## 2023-07-29 VITALS — BP 114/75 | HR 91 | Ht 63.0 in | Wt 224.6 lb

## 2023-07-29 DIAGNOSIS — Z7984 Long term (current) use of oral hypoglycemic drugs: Secondary | ICD-10-CM | POA: Diagnosis not present

## 2023-07-29 DIAGNOSIS — E782 Mixed hyperlipidemia: Secondary | ICD-10-CM

## 2023-07-29 DIAGNOSIS — I152 Hypertension secondary to endocrine disorders: Secondary | ICD-10-CM

## 2023-07-29 DIAGNOSIS — E1165 Type 2 diabetes mellitus with hyperglycemia: Secondary | ICD-10-CM

## 2023-07-29 DIAGNOSIS — Z794 Long term (current) use of insulin: Secondary | ICD-10-CM | POA: Diagnosis not present

## 2023-07-29 LAB — POCT GLYCOSYLATED HEMOGLOBIN (HGB A1C): Hemoglobin A1C: 12 % — AB (ref 4.0–5.6)

## 2023-07-29 MED ORDER — INSULIN LISPRO (1 UNIT DIAL) 100 UNIT/ML (KWIKPEN): 10.0000 [IU] | PEN_INJECTOR | Freq: Three times a day (TID) | SUBCUTANEOUS | 3 refills | Status: DC

## 2023-07-29 MED ORDER — LANTUS SOLOSTAR 100 UNIT/ML ~~LOC~~ SOPN: 50.0000 [IU] | PEN_INJECTOR | Freq: Every day | SUBCUTANEOUS | 3 refills | Status: DC

## 2023-07-29 NOTE — Progress Notes (Signed)
 Endocrinology Follow Up Note       07/29/2023, 8:29 AM   Subjective:    Patient ID: Jamie [name], female    DOB: January 20, 1971.  Jamie [name] is being seen in follow up after being seen in consultation for management of currently uncontrolled symptomatic diabetes requested by  Jolee Greig DEL, PA-C.   Past Medical History:  Diagnosis Date   Anxiety    Arthritis    knees   Bipolar 1 disorder (HCC)    Chronic diarrhea    Diverticulosis of colon    GERD (gastroesophageal reflux disease)    Hiatal hernia    History of concussion    per pt 11/ 2020 w/ no residual   Hyperlipidemia    Hypertension    followed by pcp   (11-29-2019  per pt stated never had stress test)   MDD (major depressive disorder)    Migraine    neurologist--- dr onita   OSA on CPAP    followed by dr dohmeier   PCOS (polycystic ovarian syndrome)    PONV (postoperative nausea and vomiting)    Type 2 diabetes mellitus (HCC)    followed by pcp   (11-29-2019  pt stated checks blood sugar daily in am,  fasting sugar--- 96    Past Surgical History:  Procedure Laterality Date   ANAL FISSURE REPAIR  x3  last one  2007  approx.   BIOPSY  09/06/2019   Procedure: BIOPSY;  Surgeon: Shaaron Lamar HERO, MD;  Location: [facility];  Service: Endoscopy;;   COLONOSCOPY WITH PROPOFOL  [name] 09/06/2019   pancolonic diverticulosis. Segmental biopsy completed with benign biopsies.    ESOPHAGOGASTRODUODENOSCOPY (EGD) WITH PROPOFOL  [name] 06/16/2020   Dr. Shaaron: normal esophagus s/p dilation, normal stomach, duodenum.    ESOPHAGOGASTRODUODENOSCOPY (EGD) WITH PROPOFOL  [name] 02/10/2022   Procedure: ESOPHAGOGASTRODUODENOSCOPY (EGD) WITH PROPOFOL ;  Surgeon: Shaaron Lamar HERO, MD;  Location: [facility];  Service: Endoscopy;  Laterality: [name];  9:45am   EVALUATION UNDER ANESTHESIA WITH HEMORRHOIDECTOMY [name] 12/05/2019   Procedure: ANORECTAL EXAM UNDER ANESTHESIA WITH  HEMORRHOIDECTOMY, HEMORRHOIDAL LIGATION/PEXY;  Surgeon: Sheldon Standing, MD;  Location: [facility];  Service: General;  Laterality: [name];  GENERAL AND LOCAL   EVALUATION UNDER ANESTHESIA WITH TEAR DUCT PROBING  infant   KNEE ARTHROSCOPY Right 1994   LAPAROSCOPIC CHOLECYSTECTOMY  08-06-2002  @[facility]    LUMBAR LAMINECTOMY/DECOMPRESSION MICRODISCECTOMY Right 01/22/2014   Procedure: LUMBAR FOUR TO FIVE LUMBAR LAMINECTOMY/DECOMPRESSION MICRODISCECTOMY 1 LEVEL;  Surgeon: Catalina HERO Stains, MD;  Location: [facility];  Service: Neurosurgery;  Laterality: Right;  Right L45 diskectomy   MALONEY DILATION [name] 06/16/2020   Procedure: AGAPITO DILATION;  Surgeon: Shaaron Lamar HERO, MD;  Location: [facility];  Service: Endoscopy;  Laterality: [name];   MALONEY DILATION [name] 02/10/2022   Procedure: AGAPITO DILATION;  Surgeon: Shaaron Lamar HERO, MD;  Location: [facility];  Service: Endoscopy;  Laterality: [name];    Social History   Socioeconomic History   Marital status: Married    Spouse name: Not on file   Number of children: 0   Years of education: college   Highest education level: Not on file  Occupational History  Occupation: disabled  Tobacco Use   Smoking status: Never   Smokeless tobacco: Never  Vaping Use   Vaping status: Never Used  Substance and Sexual Activity   Alcohol  use: No   Drug use: Never   Sexual activity: Yes    Partners: Male    Birth control/protection: None  Other Topics Concern   Not on file  Social History Narrative   Patient lives at home alone    Patient is right handed   Patient drinks soda's daily   Social Drivers of Corporate Investment Banker Strain: Not on file  Food Insecurity: No Food Insecurity (01/27/2023)   Hunger Vital Sign    Worried About Running Out of Food in the Last Year: Never true    Ran Out of Food in the Last Year: Never true  Transportation Needs: No Transportation Needs (01/27/2023)   PRAPARE - Administrator, Civil Service  (Medical): No    Lack of Transportation (Non-Medical): No  Physical Activity: Not on file  Stress: Not on file  Social Connections: Not on file    Family History  Problem Relation Age of Onset   COPD Father    Anxiety disorder Maternal Grandmother    Depression Maternal Grandmother    Colon cancer Neg Hx    Colon polyps Neg Hx     Outpatient Encounter Medications as of 07/29/2023  Medication Sig   ACCU-CHEK GUIDE test strip    albuterol  (VENTOLIN  HFA) 108 (90 Base) MCG/ACT inhaler Inhale 2 puffs into the lungs every 4 (four) hours as needed for wheezing or shortness of breath.   ARIPiprazole  (ABILIFY ) 20 MG tablet Take 1 tablet (20 mg total) by mouth daily.   buPROPion  (WELLBUTRIN  XL) 150 MG 24 hr tablet Take 3 tablets (450 mg total) by mouth daily.   clonazePAM  (KLONOPIN ) 1 MG tablet Take 1 mg in the morning and 2 mg at night   Continuous Glucose Sensor (DEXCOM G7 SENSOR) MISC Change sensor every 10 days. Dx E11.65   DULoxetine  (CYMBALTA ) 60 MG capsule Take 2 capsules (120 mg total) by mouth at bedtime.   fenofibrate  micronized (LOFIBRA) 134 MG capsule Take 1 capsule (134 mg total) by mouth daily.   fluticasone  (FLONASE) 50 MCG/ACT nasal spray Place 1 spray into both nostrils at bedtime as needed for allergies or rhinitis.    fluticasone  furoate-vilanterol (BREO ELLIPTA ) 100-25 MCG/ACT AEPB Inhale 1 puff into the lungs every morning.   furosemide  (LASIX ) 20 MG tablet TAKE 1 TABLET BY MOUTH DAILY   gabapentin  (NEURONTIN ) 600 MG tablet Take 600 mg by mouth at bedtime.   ibuprofen (ADVIL) 600 MG tablet Take 600 mg by mouth every 4 (four) hours as needed.   Insulin  Pen Needle (CAREFINE PEN NEEDLES) 31G X 6 MM MISC Use to inject insulin  4 times daily   metFORMIN  (GLUCOPHAGE -XR) 500 MG 24 hr tablet Take 1 tablet (500 mg total) by mouth daily with breakfast.   metoprolol  tartrate (LOPRESSOR ) 25 MG tablet TAKE 1 TABLET BY MOUTH TWICE DAILY   naproxen  (NAPROSYN ) 375 MG tablet Take 1 tablet  (375 mg total) by mouth 2 (two) times daily.   NON FORMULARY Pt uses a cpap nightly   omega-3 acid ethyl esters (LOVAZA ) 1 g capsule Take 2 g by mouth 2 (two) times daily.   omeprazole  (PRILOSEC) 40 MG capsule Take 1 capsule (40 mg total) by mouth daily.   ondansetron  (ZOFRAN -ODT) 4 MG disintegrating tablet TAKE 1 TABLET BY MOUTH EVERY 8  HOURS AS NEEDED FOR NAUSEA AND VOMITING (Patient taking differently: No sig reported)   Pancrelipase , Lip-Prot-Amyl, (ZENPEP ) 40000-126000 units CPEP TAKE TWO CAPSULES BY MOUTH THREE TIMES DAILY WITH MEALS AND 1 CAPSULE WITH AT least TWO snacks   Pseudoephedrine-Naproxen  Na (SUDAFED  SINUS 12HR PRESS+PAIN) 120-220 MG TB12 Take 120 mg by mouth 2 (two) times daily.   rosuvastatin  (CRESTOR ) 10 MG tablet Take 1 tablet (10 mg total) by mouth at bedtime.   traZODone  (DESYREL ) 100 MG tablet Take 4 tablets (400 mg total) by mouth at bedtime.   valsartan  (DIOVAN ) 80 MG tablet Take 1 tablet (80 mg total) by mouth daily.   [DISCONTINUED] insulin  glargine (LANTUS  SOLOSTAR) 100 UNIT/ML Solostar Pen Inject 30 Units into the skin at bedtime.   [DISCONTINUED] insulin  lispro (HUMALOG ) 100 UNIT/ML KwikPen Inject 8-14 Units into the skin 3 (three) times daily.   insulin  glargine (LANTUS  SOLOSTAR) 100 UNIT/ML Solostar Pen Inject 50 Units into the skin at bedtime.   insulin  lispro (HUMALOG ) 100 UNIT/ML KwikPen Inject 10-16 Units into the skin 3 (three) times daily.   No facility-administered encounter medications on file as of 07/29/2023.    ALLERGIES: Allergies  Allergen Reactions   Codeine Hives and Swelling   Ozempic (0.25 Or 0.5 Mg-Dose) [Semaglutide(0.25 Or 0.5mg -Dos)]     pancreatitis   Morphine  And Codeine Swelling and Rash    VACCINATION STATUS: Immunization History  Administered Date(s) Administered   Influenza Inj Mdck Quad Pf 04/26/2019   Moderna Sars-Covid-2 Vaccination 11/13/2019, 12/17/2019    Diabetes She presents for her follow-up diabetic visit. She has  type 2 diabetes mellitus. Onset time: diagnosed at approx age of 44. Her disease course has been worsening. There are no hypoglycemic associated symptoms. Associated symptoms include fatigue, foot paresthesias, polydipsia and polyuria. There are no hypoglycemic complications. Diabetic complications include peripheral neuropathy. (Hx pancreatitis) Risk factors for coronary artery disease include diabetes mellitus, dyslipidemia, family history, obesity, hypertension and sedentary lifestyle. Current diabetic treatment includes intensive insulin  program and oral agent (monotherapy). She is compliant with treatment most of the time. Her weight is fluctuating minimally. She is following a generally healthy diet. Meal planning includes avoidance of concentrated sweets and ADA exchanges. She has not had a previous visit with a dietitian. She participates in exercise intermittently. Her home blood glucose trend is increasing steadily. Her overall blood glucose range is 180-200 mg/dl. (She presents today with her CGM showing gross hyperglycemia overall.  Her POCT A1c today is 12%, increasing from last visit of 10.1%.  Analysis of her CGM shows TIR 0%, TAR 100%, TBR 0% with a GMI of 11.7%.  She continues to work on diet.  She is liking her Dexcom system.) An ACE inhibitor/angiotensin II receptor blocker is being taken. She does not see a podiatrist.Eye exam is current.    Review of systems  Constitutional: + Minimally fluctuating body weight,  current Body mass index is 39.79 kg/m. , no fatigue, no subjective hyperthermia, no subjective hypothermia Eyes: no blurry vision, no xerophthalmia ENT: no sore throat, no nodules palpated in throat, no dysphagia/odynophagia, no hoarseness Cardiovascular: no chest pain, no shortness of breath, no palpitations, no leg swelling Respiratory: no cough, no shortness of breath Gastrointestinal: no nausea/vomiting/diarrhea Musculoskeletal: no muscle/joint aches Skin: no rashes, no  hyperemia Neurological: no tremors, no numbness, no tingling, no dizziness Psychiatric: no depression, no anxiety  Objective:     BP 114/75 (BP Location: Right Arm, Patient Position: Sitting, Cuff Size: Large)   Pulse 91   Ht 5'  3 (1.6 m)   Wt 224 lb 9.6 oz (101.9 kg)   LMP  (LMP Unknown)   BMI 39.79 kg/m   Wt Readings from Last 3 Encounters:  07/29/23 224 lb 9.6 oz (101.9 kg)  06/28/23 222 lb (100.7 kg)  06/04/23 226 lb 6.4 oz (102.7 kg)     BP Readings from Last 3 Encounters:  07/29/23 114/75  06/28/23 113/72  06/08/23 111/76     Physical Exam- Limited  Constitutional:  Body mass index is 39.79 kg/m. , not in acute distress, normal state of mind Eyes:  EOMI, no exophthalmos Musculoskeletal: no gross deformities, strength intact in all four extremities, no gross restriction of joint movements Skin:  no rashes, no hyperemia Neurological: no tremor with outstretched hands   Diabetic Foot Exam - Simple   No data filed     CMP ( most recent) CMP     Component Value Date/Time   NA 135 06/04/2023 1919   NA 138 09/16/2011 1753   K 4.0 06/04/2023 1919   K 3.9 09/16/2011 1753   CL 99 06/04/2023 1919   CL 103 09/16/2011 1753   CO2 24 06/04/2023 1919   CO2 23 09/16/2011 1753   GLUCOSE 274 (H) 06/04/2023 1919   GLUCOSE 93 09/16/2011 1753   BUN 25 (H) 06/04/2023 1919   BUN 14 09/16/2011 1753   CREATININE 0.84 06/04/2023 1919   CREATININE 1.17 (H) 06/03/2022 0812   CALCIUM  8.9 06/04/2023 1919   CALCIUM  9.1 09/16/2011 1753   PROT 6.9 05/25/2023 1521   PROT 7.5 09/16/2011 1753   ALBUMIN  2.6 (L) 01/29/2023 0448   ALBUMIN  4.1 09/16/2011 1753   AST 23 05/25/2023 1521   AST 17 09/16/2011 1753   ALT 22 05/25/2023 1521   ALT 23 09/16/2011 1753   ALKPHOS 132 (H) 01/29/2023 0448   ALKPHOS 56 09/16/2011 1753   BILITOT 0.3 05/25/2023 1521   BILITOT 0.2 09/16/2011 1753   GFRNONAA >60 06/04/2023 1919   GFRNONAA 79 12/11/2022 1305     Diabetic Labs (most  recent): Lab Results  Component Value Date   HGBA1C 12.0 (A) 07/29/2023   HGBA1C 10.1 (H) 01/26/2023   HGBA1C 6.2 (H) 05/19/2018     Lipid Panel ( most recent) Lipid Panel     Component Value Date/Time   CHOL 249 (H) 01/27/2023 0851   TRIG 779 (H) 01/29/2023 0448   HDL 24 (L) 01/27/2023 0851   CHOLHDL 10.4 01/27/2023 0851   VLDL UNABLE TO CALCULATE IF TRIGLYCERIDE OVER 400 mg/dL 92/95/7975 9148   LDLCALC UNABLE TO CALCULATE IF TRIGLYCERIDE OVER 400 mg/dL 92/95/7975 9148   LDLCALC  06/03/2022 0812     Comment:     . LDL cholesterol not calculated. Triglyceride levels greater than 400 mg/dL invalidate calculated LDL results. . Reference range: <100 . Desirable range <100 mg/dL for primary prevention;   <70 mg/dL for patients with CHD or diabetic patients  with > or = 2 CHD risk factors. SABRA LDL-C is now calculated using the Martin-Hopkins  calculation, which is a validated novel method providing  better accuracy than the Friedewald equation in the  estimation of LDL-C.  Gladis APPLETHWAITE et al. SANDREA. 7986;689(80): 2061-2068  (http://education.[facility].com/faq/FAQ164)    LDLDIRECT 39 01/27/2023 0851      Lab Results  Component Value Date   TSH 4.080 05/19/2018   TSH 2.01 09/16/2011           Assessment & Plan:   1) Type 2 diabetes mellitus with hyperglycemia, with  long-term current use of insulin  (HCC)  She presents today with her CGM showing gross hyperglycemia overall.  Her POCT A1c today is 12%, increasing from last visit of 10.1%.  Analysis of her CGM shows TIR 0%, TAR 100%, TBR 0% with a GMI of 11.7%.  She continues to work on diet.  She is liking her Dexcom system.  - Kaleiah K Goedken has currently uncontrolled symptomatic type 2 DM since 53 years of age.   -Recent labs reviewed.  - I had a long discussion with her about the progressive nature of diabetes and the pathology behind its complications. -her diabetes is complicated by neuropathy and chronic  pancreatitis and she remains at a high risk for more acute and chronic complications which include CAD, CVA, CKD, retinopathy, and neuropathy. These are all discussed in detail with her.  The following Lifestyle Medicine recommendations according to American College of Lifestyle Medicine [facility]) were discussed and offered to patient and she agrees to start the journey:  A. Whole Foods, Plant-based plate comprising of fruits and vegetables, plant-based proteins, whole-grain carbohydrates was discussed in detail with the patient.   A list for source of those nutrients were also provided to the patient.  Patient will use only water  or unsweetened tea for hydration. B.  The need to stay away from risky substances including alcohol , smoking; obtaining 7 to 9 hours of restorative sleep, at least 150 minutes of moderate intensity exercise weekly, the importance of healthy social connections,  and stress reduction techniques were discussed. C.  A full color page of  Calorie density of various food groups per pound showing examples of each food groups was provided to the patient.  - Nutritional counseling repeated at each appointment due to patients tendency to fall back in to old habits.  - The patient admits there is a room for improvement in their diet and drink choices. -  Suggestion is made for the patient to avoid simple carbohydrates from their diet including Cakes, Sweet Desserts / Pastries, Ice Cream, Soda (diet and regular), Sweet Tea, Candies, Chips, Cookies, Sweet Pastries, Store Bought Juices, Alcohol  in Excess of 1-2 drinks a day, Artificial Sweeteners, Coffee Creamer, and Sugar-free Products. This will help patient to have stable blood glucose profile and potentially avoid unintended weight gain.   - I encouraged the patient to switch to unprocessed or minimally processed complex starch and increased protein intake (animal or plant source), fruits, and vegetables.   - Patient is advised to stick  to a routine mealtimes to eat 3 meals a day and avoid unnecessary snacks (to snack only to correct hypoglycemia).  - she has already seen Santana Duke, RDN, CDE for diabetes education.  - I have approached her with the following individualized plan to manage her diabetes and patient agrees:   -She is advised to increase her Lantus  to 50 units SQ nightly and adjust her Humalog  to 10-16 units TID with meals if glucose is above 90 and she is eating (Specific instructions on how to titrate insulin  dosage based on glucose readings given to patient in writing).  She can continue her Metformin  500 mg ER po daily for now.  -she is encouraged to continue monitoring glucose 4 times daily, before meals and before bed (using her CGM), and to call the clinic if she has readings less than 70 or above 300 for 3 tests in a row.   - she is warned not to take insulin  without proper monitoring per orders. - Adjustment parameters  are given to her for hypo and hyperglycemia in writing.  - her Doreen was previously discontinued, risk outweighs benefit for this patient- hx of UTI and yeast infections, and yeast on groin.  - she is NOT a good candidate for GLP1 therapy given hx of chronic pancreatitis and significantly elevated triglycerides.   - Specific targets for  A1c; LDL, HDL, and Triglycerides were discussed with the patient.  2) Blood Pressure /Hypertension:  her blood pressure is controlled to target.   she is advised to continue her current medications including Lisinopril  5 mg p.o. daily with breakfast.  3) Lipids/Hyperlipidemia:    Review of her recent lipid panel from 02/18/23 showed significantly elevated triglycerides of 779, LDL unable to be calculated.  she is advised to continue Crestor  20 mg daily at bedtime.  Side effects and precautions discussed with her.  4)  Weight/Diet:  her Body mass index is 39.79 kg/m.  -  clearly complicating her diabetes care.   she is a candidate for weight loss.  I discussed with her the fact that loss of 5 - 10% of her  current body weight will have the most impact on her diabetes management.  Exercise, and detailed carbohydrates information provided  -  detailed on discharge instructions.  5) Chronic Care/Health Maintenance: -she is on ACEI/ARB and Statin medications and is encouraged to initiate and continue to follow up with Ophthalmology, Dentist, Podiatrist at least yearly or according to recommendations, and advised to stay away from smoking. I have recommended yearly flu vaccine and pneumonia vaccine at least every 5 years; moderate intensity exercise for up to 150 minutes weekly; and sleep for at least 7 hours a day.  - she is advised to maintain close follow up with Jolee Greig DEL, PA-C for primary care needs, as well as her other providers for optimal and coordinated care.     I spent  25  minutes in the care of the patient today including review of labs from CMP, Lipids, Thyroid  Function, Hematology (current and previous including abstractions from other facilities); face-to-face time discussing  her blood glucose readings/logs, discussing hypoglycemia and hyperglycemia episodes and symptoms, medications doses, her options of short and long term treatment based on the latest standards of care / guidelines;  discussion about incorporating lifestyle medicine;  and documenting the encounter. Risk reduction counseling performed per USPSTF guidelines to reduce obesity and cardiovascular risk factors.     Please refer to Patient Instructions for Blood Glucose Monitoring and Insulin /Medications Dosing Guide  in media tab for additional information. Please  also refer to  Patient Self Inventory in the Media  tab for reviewed elements of pertinent patient history.  Vermell K Towles participated in the discussions, expressed understanding, and voiced agreement with the above plans.  All questions were answered to her satisfaction. she is encouraged to  contact clinic should she have any questions or concerns prior to her return visit.     Follow up plan: - Return in about 3 months (around 10/27/2023) for Diabetes F/U with A1c in office, No previsit labs, Bring meter and logs.   Benton Rio, [facility] [address] [place], [place] [address] Phone: [phone] Fax: [phone]  07/29/2023, 8:29 AM

## 2023-08-01 ENCOUNTER — Telehealth: Payer: Medicare Other | Admitting: Internal Medicine

## 2023-08-04 ENCOUNTER — Ambulatory Visit: Payer: Medicare Other | Admitting: Internal Medicine

## 2023-08-05 ENCOUNTER — Ambulatory Visit: Payer: Medicare Other | Attending: Cardiology | Admitting: Internal Medicine

## 2023-08-05 NOTE — Progress Notes (Signed)
 Erroneous encounter

## 2023-08-07 ENCOUNTER — Other Ambulatory Visit: Payer: Self-pay | Admitting: Nurse Practitioner
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
## 2023-08-09 ENCOUNTER — Ambulatory Visit: Payer: Medicare Other | Admitting: Internal Medicine

## 2023-08-09 ENCOUNTER — Ambulatory Visit: Payer: Medicare Other | Attending: Internal Medicine | Admitting: Internal Medicine

## 2023-08-09 ENCOUNTER — Telehealth: Payer: Self-pay | Admitting: *Deleted

## 2023-08-09 ENCOUNTER — Other Ambulatory Visit: Payer: Self-pay | Admitting: *Deleted

## 2023-08-09 ENCOUNTER — Encounter: Payer: Self-pay | Admitting: Internal Medicine

## 2023-08-09 VITALS — BP 115/80 | HR 94 | Ht 63.0 in | Wt 228.2 lb

## 2023-08-09 VITALS — BP 111/70 | HR 96 | Ht 63.0 in | Wt 228.0 lb

## 2023-08-09 DIAGNOSIS — R0609 Other forms of dyspnea: Secondary | ICD-10-CM

## 2023-08-09 DIAGNOSIS — Z01810 Encounter for preprocedural cardiovascular examination: Secondary | ICD-10-CM | POA: Diagnosis not present

## 2023-08-09 DIAGNOSIS — I10 Essential (primary) hypertension: Secondary | ICD-10-CM

## 2023-08-09 DIAGNOSIS — R079 Chest pain, unspecified: Secondary | ICD-10-CM | POA: Diagnosis not present

## 2023-08-09 DIAGNOSIS — E1165 Type 2 diabetes mellitus with hyperglycemia: Secondary | ICD-10-CM

## 2023-08-09 DIAGNOSIS — Z7984 Long term (current) use of oral hypoglycemic drugs: Secondary | ICD-10-CM

## 2023-08-09 DIAGNOSIS — Z794 Long term (current) use of insulin: Secondary | ICD-10-CM

## 2023-08-09 MED ORDER — LANTUS SOLOSTAR 100 UNIT/ML ~~LOC~~ SOPN: 70.0000 [IU] | PEN_INJECTOR | Freq: Every day | SUBCUTANEOUS | 1 refills | Status: DC

## 2023-08-09 MED ORDER — NITROGLYCERIN 0.4 MG SL SUBL
0.4000 mg | SUBLINGUAL_TABLET | SUBLINGUAL | 2 refills | Status: AC | PRN
Start: 2023-08-09 — End: ?

## 2023-08-09 MED ORDER — IVABRADINE HCL 7.5 MG PO TABS: ORAL_TABLET | ORAL | 0 refills | Status: DC

## 2023-08-09 NOTE — Telephone Encounter (Signed)
 Increase Lantus to 70 units nightly and continue Humalog and Metformin as previously prescribed.  Reach out in 1 week with readings.

## 2023-08-09 NOTE — Assessment & Plan Note (Signed)
 Changed acei to arb 06/28/2023 due to pseudowheeze > resolved 08/09/2023   Although even in retrospect it may not be clear the ACEi contributed to the pt's symptoms,  Pt improved off them and adding them back at this point or in the future would risk confusion in interpretation of non-specific respiratory symptoms to which this patient is prone  ie  Better not to muddy the waters here.   >>> BP good control > f/u PCP on diovan  80 mg one daily            Each maintenance medication was reviewed in detail including emphasizing most importantly the difference between maintenance and prns and under what circumstances the prns are to be triggered using an action plan format where appropriate.  Total time for H and P, chart review, counseling, reviewing hfa/dpi  device(s) and generating customized AVS unique to this office visit / same day charting = 21 min

## 2023-08-09 NOTE — Telephone Encounter (Signed)
 Patient was called and made aware. A new prescription was sent in for her Lantus due to the increase in the dose.

## 2023-08-09 NOTE — Telephone Encounter (Signed)
 Patient left a message on late yesterday. She shared in the message that  for the last 2 weeks her blood sugars have been running above 300 and or off the chart.  Talked with the patient she states that she is taking Metformin  500 mg at bedtime, Lantus  50 units at bedtime , Humalog  11-16 TID before a meal, she shares that she has been injecting 14 units as her blood sugars are 300 each time. A copy of her CGM readings printed for Whitney to review.

## 2023-08-09 NOTE — Patient Instructions (Addendum)
 Try Breo 100 every other day x 2 weeks then stop it and just use albuterol  as needed   If needing the albuterol  more than a few times a day, resume REO    If you are satisfied with your treatment plan,  let your doctor know and he/she can either refill your medications or you can return here when your prescription runs out.     If in any way you are not 100% satisfied,  please tell us .  If 100% better, tell your friends!  Pulmonary follow up is as needed

## 2023-08-09 NOTE — Patient Instructions (Addendum)
 Medication Instructions:  Your physician has recommended you make the following change in your medication:  Start taking Nitroglycerin  0.4 mg as needed for chest pain. Dissolve one under tongue for chest pain every 5 minutes up to 3 doses. If no relief, proceed to ED.  Continue taking all other medications as prescribed   Labwork: BMET to be done 1-2 weeks prior to CTA at [facility]  Testing/Procedures:   Your cardiac CT will be scheduled at one of the below locations:   [facility] [address] [place], [place] [address] [phone]  If scheduled at [facility], please arrive at the [facility] and [facility] Entrance (Entrance C2) of [facility] 30 minutes prior to test start time. You can use the FREE valet parking offered at entrance C (encouraged to control the heart rate for the test)  Proceed to the [facility] Radiology Department (first floor) to check-in and test prep.  All radiology patients and guests should use entrance C2 at [facility], accessed from [facility], even though the hospital's physical address listed is [address]    There is spacious parking and easy access to the radiology department from the [facility] V[facility] entrance. Please enter here and check-in with the desk attendant.   Please follow these instructions carefully (unless otherwise directed):  An IV will be required for this test and Nitroglycerin  will be given.   On the Night Before the Test: Be sure to Drink plenty of water . Do not consume any caffeinated/decaffeinated beverages or chocolate 12 hours prior to your test. Do not take any antihistamines 12 hours prior to your test. If the patient has contrast allergy: No allergy  On the Day of the Test: Drink plenty of water  until 1 hour prior to the test. Do not eat any food 1 hour prior to test. You may take your regular medications prior to the test.  Take Ivabradine   7.5 mg the night before and two hours prior to test. If you take Furosemide , please HOLD on the morning of the test. Patients who wear a continuous glucose monitor MUST remove the device prior to scanning. FEMALES- please wear underwire-free bra if available, avoid dresses & tight clothing      After the Test: Drink plenty of water . After receiving IV contrast, you may experience a mild flushed feeling. This is normal. On occasion, you may experience a mild rash up to 24 hours after the test. This is not dangerous. If this occurs, you can take Benadryl 25 mg and increase your fluid intake. If you experience trouble breathing, this can be serious. If it is severe call 911 IMMEDIATELY. If it is mild, please call our office.  We will call to schedule your test 2-4 weeks out understanding that some insurance companies will need an authorization prior to the service being performed.   For more information and frequently asked questions, please visit our website : [email or web address]  For non-scheduling related questions, please contact the cardiac imaging nurse navigator should you have any questions/concerns: Cardiac Imaging Nurse Navigators Direct Office Dial : [phone]   For scheduling needs, including cancellations and rescheduling, please call Brittany, [phone].   Follow-Up: Your physician recommends that you schedule a follow-up appointment in: 6 months  Any Other Special Instructions Will Be Listed Below (If Applicable). Thank you for choosing [facility]!      If you need a refill on your cardiac medications before your next appointment, please  call your pharmacy.

## 2023-08-09 NOTE — Assessment & Plan Note (Addendum)
 Onset in her 40s/ never smoker - PFTs  02/23/23 min restrictive changes / no significant obst p trelegy in am  - D/c acei 06/28/2023 due to prominent pseudowheeze > resolved 08/09/2023 > ty off BREO    Exam perfectly clear on BREO 100 so try every other day x 2 weeks then try off and just use saba with rule of 2's for chronic use more than 2 weeks

## 2023-08-10 ENCOUNTER — Encounter (HOSPITAL_COMMUNITY): Payer: Self-pay

## 2023-08-10 ENCOUNTER — Ambulatory Visit (HOSPITAL_COMMUNITY)
Admission: RE | Admit: 2023-08-10 | Discharge: 2023-08-10 | Disposition: A | Discharge status: Home / Self Care | Payer: Medicare Other | Source: Ambulatory Visit | Attending: Internal Medicine | Admitting: Internal Medicine

## 2023-08-10 DIAGNOSIS — R1032 Left lower quadrant pain: Secondary | ICD-10-CM | POA: Diagnosis not present

## 2023-08-10 DIAGNOSIS — Z8719 Personal history of other diseases of the digestive system: Secondary | ICD-10-CM | POA: Insufficient documentation

## 2023-08-10 DIAGNOSIS — R160 Hepatomegaly, not elsewhere classified: Secondary | ICD-10-CM | POA: Diagnosis not present

## 2023-08-10 DIAGNOSIS — R079 Chest pain, unspecified: Secondary | ICD-10-CM | POA: Insufficient documentation

## 2023-08-10 DIAGNOSIS — K5730 Diverticulosis of large intestine without perforation or abscess without bleeding: Secondary | ICD-10-CM | POA: Diagnosis not present

## 2023-08-10 DIAGNOSIS — K859 Acute pancreatitis without necrosis or infection, unspecified: Secondary | ICD-10-CM | POA: Diagnosis not present

## 2023-08-10 LAB — POCT I-STAT CREATININE: Creatinine, Ser: 1.2 mg/dL — ABNORMAL HIGH (ref 0.44–1.00)

## 2023-08-10 MED ORDER — IOHEXOL 300 MG/ML  SOLN
100.0000 mL | Freq: Once | INTRAMUSCULAR | Status: AC | PRN
  Administered 2023-08-10: 100 mL via INTRAVENOUS

## 2023-08-10 NOTE — Progress Notes (Signed)
 Cardiology Office Note  Date: 08/10/2023   ID: Jamie [name], DOB 12-06-1970, MRN [id number]  PCP:  Jamie Greig DEL, PA-C  Cardiologist:  Diannah SHAUNNA Maywood, MD Electrophysiologist:  None   History of Present Illness: Jamie [name] is a 53 y.o. female known to have HTN, DM 2, HLD, morbid obesity is here for follow-up visit.  Patient was initially seen in the cardiology clinic in 2021 for evaluation of DOE and atypical chest pain.  Echo and NM stress test were unremarkable.  She was referred back again for similar symptoms.  She reported ongoing DOE which completely resolved after starting p.o. Lasix  20 mg once daily.  She also had atypical chest pain that also resolved with resolution DOE symptoms.  However she did report exertional chest tightness for the last 6 months to 1 year, lasting for minutes and resolving with rest and frequency being 2 times per month.  METs less than 4 at home.  Does not do much at home.  Past Medical History:  Diagnosis Date   Anxiety    Arthritis    knees   Bipolar 1 disorder (HCC)    Chronic diarrhea    Diverticulosis of colon    GERD (gastroesophageal reflux disease)    Hiatal hernia    History of concussion    per pt 11/ 2020 w/ no residual   Hyperlipidemia    Hypertension    followed by pcp   (11-29-2019  per pt stated never had stress test)   MDD (major depressive disorder)    Migraine    neurologist--- dr onita   OSA on CPAP    followed by dr dohmeier   PCOS (polycystic ovarian syndrome)    PONV (postoperative nausea and vomiting)    Type 2 diabetes mellitus (HCC)    followed by pcp   (11-29-2019  pt stated checks blood sugar daily in am,  fasting sugar--- 96    Past Surgical History:  Procedure Laterality Date   ANAL FISSURE REPAIR  x3  last one  2007  approx.   BIOPSY  09/06/2019   Procedure: BIOPSY;  Surgeon: Shaaron Lamar HERO, MD;  Location: [facility];  Service: Endoscopy;;   COLONOSCOPY WITH PROPOFOL  [name] 09/06/2019    pancolonic diverticulosis. Segmental biopsy completed with benign biopsies.    ESOPHAGOGASTRODUODENOSCOPY (EGD) WITH PROPOFOL  [name] 06/16/2020   Dr. Shaaron: normal esophagus s/p dilation, normal stomach, duodenum.    ESOPHAGOGASTRODUODENOSCOPY (EGD) WITH PROPOFOL  [name] 02/10/2022   Procedure: ESOPHAGOGASTRODUODENOSCOPY (EGD) WITH PROPOFOL ;  Surgeon: Shaaron Lamar HERO, MD;  Location: [facility];  Service: Endoscopy;  Laterality: [name];  9:45am   EVALUATION UNDER ANESTHESIA WITH HEMORRHOIDECTOMY [name] 12/05/2019   Procedure: ANORECTAL EXAM UNDER ANESTHESIA WITH HEMORRHOIDECTOMY, HEMORRHOIDAL LIGATION/PEXY;  Surgeon: Sheldon Standing, MD;  Location: [name];  Service: General;  Laterality: [name];  GENERAL AND LOCAL   EVALUATION UNDER ANESTHESIA WITH TEAR DUCT PROBING  infant   KNEE ARTHROSCOPY Right 1994   LAPAROSCOPIC CHOLECYSTECTOMY  08-06-2002  @[facility]    LUMBAR LAMINECTOMY/DECOMPRESSION MICRODISCECTOMY Right 01/22/2014   Procedure: LUMBAR FOUR TO FIVE LUMBAR LAMINECTOMY/DECOMPRESSION MICRODISCECTOMY 1 LEVEL;  Surgeon: Catalina HERO Stains, MD;  Location: [facility];  Service: Neurosurgery;  Laterality: Right;  Right L45 diskectomy   MALONEY DILATION [name] 06/16/2020   Procedure: AGAPITO DILATION;  Surgeon: Shaaron Lamar HERO, MD;  Location: [facility];  Service: Endoscopy;  Laterality: [name];   MALONEY DILATION [name] 02/10/2022   Procedure: AGAPITO DILATION;  Surgeon: Shaaron,  Lamar HERO, MD;  Location: [facility];  Service: Endoscopy;  Laterality: [name];    Current Outpatient Medications  Medication Sig Dispense Refill   ACCU-CHEK GUIDE test strip      albuterol  (VENTOLIN  HFA) 108 (90 Base) MCG/ACT inhaler Inhale 2 puffs into the lungs every 4 (four) hours as needed for wheezing or shortness of breath. 1 each 3   ARIPiprazole  (ABILIFY ) 20 MG tablet Take 1 tablet (20 mg total) by mouth daily. 90 tablet 1   buPROPion  (WELLBUTRIN  XL) 150 MG 24 hr tablet Take 3 tablets (450 mg total) by mouth daily. 90 tablet  5   clonazePAM  (KLONOPIN ) 1 MG tablet Take 1 mg in the morning and 2 mg at night 90 tablet 4   Continuous Glucose Sensor (DEXCOM G7 SENSOR) MISC USE AS DIRECTED AND CHANGE EVERY 10 DAYS 9 each 0   DULoxetine  (CYMBALTA ) 60 MG capsule Take 2 capsules (120 mg total) by mouth at bedtime. 60 capsule 4   fenofibrate  micronized (LOFIBRA) 134 MG capsule Take 1 capsule (134 mg total) by mouth daily. 90 capsule 2   fluticasone  (FLONASE) 50 MCG/ACT nasal spray Place 1 spray into both nostrils at bedtime as needed for allergies or rhinitis.      fluticasone  furoate-vilanterol (BREO ELLIPTA ) 100-25 MCG/ACT AEPB Inhale 1 puff into the lungs every morning. 28 each 11   furosemide  (LASIX ) 20 MG tablet TAKE 1 TABLET BY MOUTH DAILY 30 tablet 2   gabapentin  (NEURONTIN ) 600 MG tablet Take 600 mg by mouth at bedtime.     ibuprofen (ADVIL) 600 MG tablet Take 600 mg by mouth every 4 (four) hours as needed.     insulin  glargine (LANTUS  SOLOSTAR) 100 UNIT/ML Solostar Pen Inject 70 Units into the skin at bedtime. 60 mL 1   insulin  lispro (HUMALOG ) 100 UNIT/ML KwikPen Inject 10-16 Units into the skin 3 (three) times daily. 45 mL 3   Insulin  Pen Needle (CAREFINE PEN NEEDLES) 31G X 6 MM MISC Use to inject insulin  4 times daily 300 each 3   ivabradine  (CORLANOR) 7.5 MG TABS tablet Take one tablet the night before procedure and one tablet the morning of procedure 2 tablet 0   metFORMIN  (GLUCOPHAGE -XR) 500 MG 24 hr tablet Take 1 tablet (500 mg total) by mouth daily with breakfast. 90 tablet 3   metoprolol  tartrate (LOPRESSOR ) 25 MG tablet TAKE 1 TABLET BY MOUTH TWICE DAILY 180 tablet 1   nitroGLYCERIN  (NITROSTAT ) 0.4 MG SL tablet Place 1 tablet (0.4 mg total) under the tongue every 5 (five) minutes x 3 doses as needed (if no relief after 3rd dose proceed to ED or call 911). 25 tablet 2   NON FORMULARY Pt uses a cpap nightly     omega-3 acid ethyl esters (LOVAZA ) 1 g capsule Take 2 g by mouth 2 (two) times daily.     omeprazole   (PRILOSEC) 40 MG capsule Take 1 capsule (40 mg total) by mouth daily. 30 capsule 11   ondansetron  (ZOFRAN -ODT) 4 MG disintegrating tablet TAKE 1 TABLET BY MOUTH EVERY 8 HOURS AS NEEDED FOR NAUSEA AND VOMITING (Patient taking differently: No sig reported) 90 tablet 3   Pancrelipase , Lip-Prot-Amyl, (ZENPEP ) 40000-126000 units CPEP TAKE TWO CAPSULES BY MOUTH THREE TIMES DAILY WITH MEALS AND 1 CAPSULE WITH AT least TWO snacks 270 capsule 3   rosuvastatin  (CRESTOR ) 10 MG tablet Take 1 tablet (10 mg total) by mouth at bedtime. 90 tablet 1   traZODone  (DESYREL ) 100 MG tablet Take 4 tablets (400  mg total) by mouth at bedtime. 360 tablet 1   valsartan  (DIOVAN ) 80 MG tablet Take 1 tablet (80 mg total) by mouth daily. 30 tablet 11   No current facility-administered medications for this visit.   Allergies:  Codeine, Ozempic (0.25 or 0.5 mg-dose) [semaglutide(0.25 or 0.5mg -dos)], and Morphine  and codeine   Social History: The patient  reports that she has never smoked. She has never used smokeless tobacco. She reports that she does not drink alcohol  and does not use drugs.   Family History: The patient's family history includes Anxiety disorder in her maternal grandmother; COPD in her father; Depression in her maternal grandmother.   ROS:  Please see the history of present illness. Otherwise, complete review of systems is positive for none  All other systems are reviewed and negative.   Physical Exam: VS:  BP 115/80   Pulse 94   Ht 5' 3 (1.6 m)   Wt 228 lb 3.2 oz (103.5 kg)   LMP  (LMP Unknown)   SpO2 96%   BMI 40.42 kg/m , BMI Body mass index is 40.42 kg/m.  Wt Readings from Last 3 Encounters:  08/09/23 228 lb (103.4 kg)  08/09/23 228 lb 3.2 oz (103.5 kg)  07/29/23 224 lb 9.6 oz (101.9 kg)    General: Patient appears comfortable at rest. HEENT: Conjunctiva and lids normal, oropharynx clear with moist mucosa. Neck: Supple, no elevated JVP or carotid bruits, no thyromegaly. Lungs: Clear to  auscultation, nonlabored breathing at rest. Cardiac: Regular rate and rhythm, no S3 or significant systolic murmur, no pericardial rub. Abdomen: Soft, nontender, no hepatomegaly, bowel sounds present, no guarding or rebound. Extremities: No pitting edema, distal pulses 2+. Skin: Warm and dry. Musculoskeletal: No kyphosis. Neuropsychiatric: Alert and oriented x3, affect grossly appropriate.  Recent Labwork: 05/25/2023: ALT 22; AST 23 06/04/2023: BUN 25; Creatinine, Ser 0.84; Hemoglobin 14.3; Platelets 167; Potassium 4.0; Sodium 135     Component Value Date/Time   CHOL 249 (H) 01/27/2023 0851   TRIG 779 (H) 01/29/2023 0448   HDL 24 (L) 01/27/2023 0851   CHOLHDL 10.4 01/27/2023 0851   VLDL UNABLE TO CALCULATE IF TRIGLYCERIDE OVER 400 mg/dL 92/95/7975 9148   LDLCALC UNABLE TO CALCULATE IF TRIGLYCERIDE OVER 400 mg/dL 92/95/7975 9148   LDLCALC  06/03/2022 0812     Comment:     . LDL cholesterol not calculated. Triglyceride levels greater than 400 mg/dL invalidate calculated LDL results. . Reference range: <100 . Desirable range <100 mg/dL for primary prevention;   <70 mg/dL for patients with CHD or diabetic patients  with > or = 2 CHD risk factors. SABRA LDL-C is now calculated using the Martin-Hopkins  calculation, which is a validated novel method providing  better accuracy than the Friedewald equation in the  estimation of LDL-C.  Gladis APPLETHWAITE et al. SANDREA. 7986;689(80): 2061-2068  (http://education.[employer].com/faq/FAQ164)    LDLDIRECT 39 01/27/2023 0851     Assessment and Plan:   DOE, questionable chronic diastolic heart failure: Echocardiogram in 8/24 showed normal LVEF, normal diastology and no valvular heart disease.  Symptoms of DOE significantly improved after starting p.o. Lasix  20 mg once daily.  Normal serum creatinine.  Continue p.o. Lasix  20 mg once daily.   Exertional chest tightness: Ongoing exertional chest tightness x 6 months to 1 year, occurring twice per  month, lasting for minutes and resolving with rest.  She reported that the atypical chest pains were resolved with the Lasix .  Continue metoprolol  tartrate 25 mg twice daily.  Heart rates  around 90s.  Will obtain CT cardiac (cardiac risk factors include insulin -dependent diabetes mellitus, severe hypertriglyceridemia).  Start ivabradine  7.5 mg prior to CT cardiac (1 tablet the night before and 1 tablet 1 hour prior to the CT cardiac procedure).  Prescribed SL NTG 0.4 mg as needed for chest tightness.   Hypertriglyceridemia: TG 1000s in 11/2022 due to which she was hospitalized with pancreatitis. Repeat EGD in 7/24 was 779 after starting Lovaza  2 g twice daily and already was on Crestor  20 mg nightly.  Continue Crestor  20 mg nightly, fenofibrate  134 mg once daily and Lovaza  2 g twice daily.  No side effects.  She has upcoming lipid panel blood work with her PCP.  Will need to obtain the result from PCPs office.  Referral placed to the advanced lipid disorders clinic for the management of severe hypertriglyceridemia.  Aggressive DM2 control is recommended.   Type 2 diabetes mellitus, insulin -dependent: She was initially started on Ozempic but later discontinued due to pancreatitis.   OSA on CPAP: Continue CPAP.      Medication Adjustments/Labs and Tests Ordered: Current medicines are reviewed at length with the patient today.  Concerns regarding medicines are outlined above.    Disposition:  Follow up 6 months  Signed Alecxander Mainwaring Priya Keyaan Lederman, MD, 08/10/2023 12:51 PM    [facility] [address] [place], [place], [place] [address]

## 2023-08-22 ENCOUNTER — Telehealth: Payer: Self-pay | Admitting: Internal Medicine

## 2023-08-22 ENCOUNTER — Telehealth: Payer: Self-pay

## 2023-08-22 ENCOUNTER — Telehealth: Payer: Self-pay | Admitting: *Deleted

## 2023-08-22 ENCOUNTER — Other Ambulatory Visit (HOSPITAL_COMMUNITY): Payer: Self-pay

## 2023-08-22 NOTE — Telephone Encounter (Signed)
Pt c/o medication issue:  1. Name of Medication: ivabradine (CORLANOR) 7.5 MG TABS tablet   2. How are you currently taking this medication (dosage and times per day)?   3. Are you having a reaction (difficulty breathing--STAT)?   4. What is your medication issue? Pharmacy is calling requesting a change of medication due to medication being on back order. Please advise.

## 2023-08-22 NOTE — Telephone Encounter (Signed)
Patient called and left a message that her blood sugars are running 350 and higher. Patient called. Pt called with high BG readings.   Date Before breakfast Before lunch Before supper Bedtime  08/19/2023 299 Humalog 13 units 324 Humalog 14 units 297 Humalog 13 units 359 Lantus 70 units  08/20/23 335 Humalog 14 units 312 Humalog 14 units 328 Humalog 14 units 356 Lantus 70 units  08/21/2023 298 Humalog 13 units 334 Humalog 14 units 327 Humalog 14 units 348 Lantus 70 units  01/27/22025 313 Humalog 14 units 304 Humalog 14 units      Pt taking: Lantus 70 units at bedtime, Humalog 10-16 units three times daily before meals, Metformin 500 mg daily with her breakfast.

## 2023-08-22 NOTE — Telephone Encounter (Signed)
Pharmacy Patient Advocate Encounter   Received notification from CoverMyMeds that prior authorization for CORLANOR 7.5 MG is required/requested.   Insurance verification completed.   The patient is insured through [facility] .   PLANS WILL NOT APPROVE CORLANOR PA'S UNLESS PT IS TAKING THE DRUG DAILY FOR AN APPROVED INDICATION. SCRIPTS FOR LESS THAN 30 TABS SHOULD BE RAN UNDER [employer] OR AS CASH. PHARMACY AWARE.

## 2023-08-23 ENCOUNTER — Other Ambulatory Visit (HOSPITAL_COMMUNITY): Payer: Self-pay

## 2023-08-23 MED ORDER — IVABRADINE HCL 7.5 MG PO TABS
ORAL_TABLET | ORAL | 0 refills | Status: DC
  Filled 2023-08-23: qty 2, 1d supply, fill #0

## 2023-08-23 NOTE — Telephone Encounter (Signed)
Patient informed and corlanor 7.5 mg prescription sent to [facility]. Also gave patient the number to call to discuss payment and delivery to home address.

## 2023-08-23 NOTE — Telephone Encounter (Signed)
Patient was called and made aware.

## 2023-08-23 NOTE — Telephone Encounter (Signed)
Increase Lantus to 80 units nightly and increase Humalog to 14-20 units TID

## 2023-08-25 ENCOUNTER — Telehealth: Payer: Self-pay | Admitting: Internal Medicine

## 2023-08-25 DIAGNOSIS — E1165 Type 2 diabetes mellitus with hyperglycemia: Secondary | ICD-10-CM | POA: Diagnosis not present

## 2023-08-25 DIAGNOSIS — R42 Dizziness and giddiness: Secondary | ICD-10-CM | POA: Diagnosis not present

## 2023-08-25 DIAGNOSIS — I10 Essential (primary) hypertension: Secondary | ICD-10-CM | POA: Diagnosis not present

## 2023-08-25 DIAGNOSIS — Z794 Long term (current) use of insulin: Secondary | ICD-10-CM | POA: Diagnosis not present

## 2023-08-25 DIAGNOSIS — R0602 Shortness of breath: Secondary | ICD-10-CM | POA: Diagnosis not present

## 2023-08-25 DIAGNOSIS — R079 Chest pain, unspecified: Secondary | ICD-10-CM | POA: Diagnosis not present

## 2023-08-25 NOTE — Telephone Encounter (Signed)
Spoke with Jamie [name] at [facility]. Processing BMP lab her Glucose level is 463. Sent over to provider.

## 2023-08-25 NOTE — Telephone Encounter (Signed)
[facility] lab is calling with a critical lab.

## 2023-08-25 NOTE — Telephone Encounter (Signed)
Called patient advised her of her glucose level. [name] is aware. Advised that she needed to go to the ER if her symptoms worsen from Diabetic Ketoacidosis. Patient stated that her levels are always elevated. She stated that she will call her endocrinologist, but will go to the ER if she felt bad.

## 2023-08-29 ENCOUNTER — Telehealth: Payer: Self-pay | Admitting: Adult Health

## 2023-08-29 NOTE — Telephone Encounter (Signed)
Pt cancelled appt due to do not have copay. Will call back to reschedule

## 2023-08-30 ENCOUNTER — Telehealth (HOSPITAL_COMMUNITY): Payer: Self-pay | Admitting: *Deleted

## 2023-08-30 ENCOUNTER — Ambulatory Visit: Payer: Medicare Other | Admitting: Neurology

## 2023-08-30 NOTE — Telephone Encounter (Signed)
 Attempted to call patient regarding upcoming cardiac CT appointment but the number on file wasn't active. Called husband's number on file and left message for him or patient to call back.  Chantal Requena RN Navigator Cardiac Imaging [facility] [phone] Office [phone] Cell

## 2023-08-31 ENCOUNTER — Other Ambulatory Visit: Payer: Self-pay | Admitting: Internal Medicine

## 2023-08-31 ENCOUNTER — Other Ambulatory Visit: Payer: Self-pay

## 2023-08-31 ENCOUNTER — Ambulatory Visit (HOSPITAL_COMMUNITY)
Admission: RE | Admit: 2023-08-31 | Discharge: 2023-08-31 | Disposition: A | Discharge status: Home / Self Care | Payer: Medicare Other | Source: Ambulatory Visit | Attending: Internal Medicine | Admitting: Internal Medicine

## 2023-08-31 DIAGNOSIS — R079 Chest pain, unspecified: Secondary | ICD-10-CM | POA: Insufficient documentation

## 2023-08-31 DIAGNOSIS — I2510 Atherosclerotic heart disease of native coronary artery without angina pectoris: Secondary | ICD-10-CM | POA: Diagnosis not present

## 2023-08-31 DIAGNOSIS — R931 Abnormal findings on diagnostic imaging of heart and coronary circulation: Secondary | ICD-10-CM | POA: Insufficient documentation

## 2023-08-31 DIAGNOSIS — Z8719 Personal history of other diseases of the digestive system: Secondary | ICD-10-CM

## 2023-08-31 DIAGNOSIS — K529 Noninfective gastroenteritis and colitis, unspecified: Secondary | ICD-10-CM

## 2023-08-31 DIAGNOSIS — R7989 Other specified abnormal findings of blood chemistry: Secondary | ICD-10-CM

## 2023-08-31 MED ORDER — NITROGLYCERIN 0.4 MG SL SUBL
0.8000 mg | SUBLINGUAL_TABLET | Freq: Once | SUBLINGUAL | Status: AC
  Administered 2023-08-31: 0.8 mg via SUBLINGUAL

## 2023-08-31 MED ORDER — ZENPEP 40000-126000 UNITS PO CPEP: ORAL_CAPSULE | ORAL | 3 refills | Status: DC

## 2023-08-31 MED ORDER — IOHEXOL 350 MG/ML SOLN
95.0000 mL | Freq: Once | INTRAVENOUS | Status: AC | PRN
  Administered 2023-08-31: 95 mL via INTRAVENOUS

## 2023-08-31 MED ORDER — IOHEXOL 350 MG/ML SOLN: 95.0000 mL | Freq: Once | INTRAVENOUS | Status: DC | PRN

## 2023-08-31 MED ORDER — NITROGLYCERIN 0.4 MG SL SUBL
SUBLINGUAL_TABLET | SUBLINGUAL | Status: AC
  Filled 2023-08-31: qty 2

## 2023-08-31 NOTE — Telephone Encounter (Signed)
 Noted.  Refills sent in.

## 2023-08-31 NOTE — Progress Notes (Signed)
 Refill of 270 sent in with 3 additional refills

## 2023-09-01 ENCOUNTER — Other Ambulatory Visit: Payer: Self-pay | Admitting: Cardiology

## 2023-09-01 ENCOUNTER — Ambulatory Visit (HOSPITAL_BASED_OUTPATIENT_CLINIC_OR_DEPARTMENT_OTHER)
Admission: RE | Admit: 2023-09-01 | Discharge: 2023-09-01 | Disposition: A | Discharge status: Home / Self Care | Payer: Medicare Other | Source: Ambulatory Visit | Attending: Cardiology | Admitting: Cardiology

## 2023-09-01 DIAGNOSIS — R931 Abnormal findings on diagnostic imaging of heart and coronary circulation: Secondary | ICD-10-CM

## 2023-09-01 DIAGNOSIS — I2510 Atherosclerotic heart disease of native coronary artery without angina pectoris: Secondary | ICD-10-CM

## 2023-09-01 DIAGNOSIS — R079 Chest pain, unspecified: Secondary | ICD-10-CM | POA: Diagnosis not present

## 2023-09-05 ENCOUNTER — Telehealth: Payer: Self-pay | Admitting: Internal Medicine

## 2023-09-05 ENCOUNTER — Other Ambulatory Visit: Payer: Self-pay | Admitting: Internal Medicine

## 2023-09-05 NOTE — Telephone Encounter (Signed)
 Pt called in asking to speak with nurse because she would like her results to be explained further. Please advise.

## 2023-09-05 NOTE — Telephone Encounter (Signed)
 Discussed the MyChart message with patient that was sent. And that someone will contact her to schedule her an appointment soon. Dr. Viona Grieve will discuss results further in detail. Patient verbalized understanding.

## 2023-09-13 DIAGNOSIS — J209 Acute bronchitis, unspecified: Secondary | ICD-10-CM | POA: Diagnosis not present

## 2023-09-13 DIAGNOSIS — Z112 Encounter for screening for other bacterial diseases: Secondary | ICD-10-CM | POA: Diagnosis not present

## 2023-09-13 DIAGNOSIS — J029 Acute pharyngitis, unspecified: Secondary | ICD-10-CM | POA: Diagnosis not present

## 2023-09-13 DIAGNOSIS — J019 Acute sinusitis, unspecified: Secondary | ICD-10-CM | POA: Diagnosis not present

## 2023-09-13 DIAGNOSIS — R509 Fever, unspecified: Secondary | ICD-10-CM | POA: Diagnosis not present

## 2023-09-16 ENCOUNTER — Encounter: Payer: Medicare Other | Admitting: Internal Medicine

## 2023-09-16 NOTE — Progress Notes (Signed)
 Erroneous encounter - please disregard.

## 2023-09-19 ENCOUNTER — Emergency Department (HOSPITAL_COMMUNITY): Payer: Medicare Other

## 2023-09-19 ENCOUNTER — Encounter (HOSPITAL_COMMUNITY): Payer: Self-pay | Admitting: Emergency Medicine

## 2023-09-19 ENCOUNTER — Other Ambulatory Visit: Payer: Self-pay

## 2023-09-19 ENCOUNTER — Emergency Department (HOSPITAL_COMMUNITY)
Admission: EM | Admit: 2023-09-19 | Discharge: 2023-09-19 | Disposition: A | Discharge status: Home / Self Care | Payer: Medicare Other

## 2023-09-19 DIAGNOSIS — Z7984 Long term (current) use of oral hypoglycemic drugs: Secondary | ICD-10-CM | POA: Diagnosis not present

## 2023-09-19 DIAGNOSIS — E119 Type 2 diabetes mellitus without complications: Secondary | ICD-10-CM | POA: Diagnosis not present

## 2023-09-19 DIAGNOSIS — K5730 Diverticulosis of large intestine without perforation or abscess without bleeding: Secondary | ICD-10-CM | POA: Diagnosis not present

## 2023-09-19 DIAGNOSIS — R188 Other ascites: Secondary | ICD-10-CM | POA: Diagnosis not present

## 2023-09-19 DIAGNOSIS — R1084 Generalized abdominal pain: Secondary | ICD-10-CM | POA: Insufficient documentation

## 2023-09-19 DIAGNOSIS — R1032 Left lower quadrant pain: Secondary | ICD-10-CM | POA: Diagnosis not present

## 2023-09-19 DIAGNOSIS — K760 Fatty (change of) liver, not elsewhere classified: Secondary | ICD-10-CM | POA: Diagnosis not present

## 2023-09-19 LAB — CBC
HCT: 43.4 % (ref 36.0–46.0)
Hemoglobin: 13.9 g/dL (ref 12.0–15.0)
MCH: 27.9 pg (ref 26.0–34.0)
MCHC: 32 g/dL (ref 30.0–36.0)
MCV: 87.1 fL (ref 80.0–100.0)
Platelets: 147 10*3/uL — ABNORMAL LOW (ref 150–400)
RBC: 4.98 MIL/uL (ref 3.87–5.11)
RDW: 15.1 % (ref 11.5–15.5)
WBC: 7.1 10*3/uL (ref 4.0–10.5)
nRBC: 0 % (ref 0.0–0.2)

## 2023-09-19 LAB — COMPREHENSIVE METABOLIC PANEL
ALT: 30 U/L (ref 0–44)
AST: 22 U/L (ref 15–41)
Albumin: 3.7 g/dL (ref 3.5–5.0)
Alkaline Phosphatase: 75 U/L (ref 38–126)
Anion gap: 11 (ref 5–15)
BUN: 24 mg/dL — ABNORMAL HIGH (ref 6–20)
CO2: 26 mmol/L (ref 22–32)
Calcium: 9.6 mg/dL (ref 8.9–10.3)
Chloride: 97 mmol/L — ABNORMAL LOW (ref 98–111)
Creatinine, Ser: 0.72 mg/dL (ref 0.44–1.00)
GFR, Estimated: >60 mL/min (ref >60)
Glucose, Bld: 388 mg/dL — ABNORMAL HIGH (ref 70–99)
Potassium: 3.9 mmol/L (ref 3.5–5.1)
Sodium: 134 mmol/L — ABNORMAL LOW (ref 135–145)
Total Bilirubin: 0.6 mg/dL (ref 0.0–1.2)
Total Protein: 6.8 g/dL (ref 6.5–8.1)

## 2023-09-19 LAB — RESP PANEL BY RT-PCR (RSV, FLU A&B, COVID)  RVPGX2
Influenza A by PCR: NEGATIVE
Influenza B by PCR: NEGATIVE
Resp Syncytial Virus by PCR: NEGATIVE
SARS Coronavirus 2 by RT PCR: NEGATIVE

## 2023-09-19 LAB — LIPASE, BLOOD: Lipase: 26 U/L (ref 11–51)

## 2023-09-19 LAB — CBG MONITORING, ED: Glucose-Capillary: 262 mg/dL — ABNORMAL HIGH (ref 70–99)

## 2023-09-19 MED ORDER — LIDOCAINE 5 % EX PTCH: 1.0000 | MEDICATED_PATCH | CUTANEOUS | 0 refills | Status: DC

## 2023-09-19 MED ORDER — LACTATED RINGERS IV BOLUS
1000.0000 mL | Freq: Once | INTRAVENOUS | Status: AC
  Administered 2023-09-19: 1000 mL via INTRAVENOUS

## 2023-09-19 MED ORDER — IOHEXOL 300 MG/ML  SOLN
100.0000 mL | Freq: Once | INTRAMUSCULAR | Status: AC | PRN
  Administered 2023-09-19: 100 mL via INTRAVENOUS

## 2023-09-19 MED ORDER — DICYCLOMINE HCL 20 MG PO TABS: 20.0000 mg | ORAL_TABLET | Freq: Two times a day (BID) | ORAL | 0 refills | Status: DC

## 2023-09-19 MED ORDER — KETOROLAC TROMETHAMINE 15 MG/ML IJ SOLN
15.0000 mg | Freq: Once | INTRAMUSCULAR | Status: AC
  Administered 2023-09-19: 15 mg via INTRAVENOUS
  Filled 2023-09-19: qty 1

## 2023-09-19 NOTE — ED Triage Notes (Signed)
 Pt reports she woke up Friday with abd pain and nausea. She called her dr office and they directed her to the ED. She did not come until today because she did not want to wait. Pt reports she has a hx of pancreatitis and believes that is what is going on. Pt currently has strep throat and is on doxy. Pt reports compliance with all medications.

## 2023-09-19 NOTE — ED Provider Notes (Cosign Needed Addendum)
 [place] EMERGENCY DEPARTMENT AT [facility] Provider Note   CSN: [id number] Arrival date & time: 09/19/23  1000     History  Chief Complaint  Patient presents with   Abdominal Pain    Jamie [name] is a 53 y.o. female. Has PMH of diabetes, pancreatitis, sleep apnea, obesity.  Presents to ER complaining of left flank pain x 3 days and left lower quadrant pain.  She does feel similar to prior pancreatitis, she is able to eat and drink and pain improved with ibuprofen prescription medication.  Denies diarrhea, no vomiting, no hematemesis.  Pain is rated 9 out of 10 at this time.  Pain is worse with palpation.   Abdominal Pain      Home Medications Prior to Admission medications   Medication Sig Start Date End Date Taking? Authorizing Provider  ACCU-CHEK GUIDE test strip  05/18/23   [provider]  albuterol (VENTOLIN HFA) 108 (90 Base) MCG/ACT inhaler Inhale 2 puffs into the lungs every 4 (four) hours as needed for wheezing or shortness of breath. 06/04/23   Eber Hong, MD  ARIPiprazole (ABILIFY) 20 MG tablet Take 1 tablet (20 mg total) by mouth daily. 07/01/23   Plovsky, Earvin Hansen, MD  buPROPion (WELLBUTRIN XL) 150 MG 24 hr tablet Take 3 tablets (450 mg total) by mouth daily. 07/01/23   Plovsky, Earvin Hansen, MD  clonazePAM (KLONOPIN) 1 MG tablet Take 1 mg in the morning and 2 mg at night 07/01/23   Plovsky, Earvin Hansen, MD  Continuous Glucose Sensor (DEXCOM G7 SENSOR) MISC USE AS DIRECTED AND CHANGE EVERY 10 DAYS 08/08/23   Dani Gobble, NP  DULoxetine (CYMBALTA) 60 MG capsule Take 2 capsules (120 mg total) by mouth at bedtime. 07/01/23   Plovsky, Earvin Hansen, MD  fenofibrate micronized (LOFIBRA) 134 MG capsule Take 1 capsule (134 mg total) by mouth daily. 04/26/23   Mallipeddi, Vishnu P, MD  fluticasone (FLONASE) 50 MCG/ACT nasal spray Place 1 spray into both nostrils at bedtime as needed for allergies or rhinitis.     [provider]  fluticasone  furoate-vilanterol (BREO ELLIPTA) 100-25 MCG/ACT AEPB Inhale 1 puff into the lungs every morning. 06/28/23   Nyoka Cowden, MD  furosemide (LASIX) 20 MG tablet TAKE 1 TABLET BY MOUTH DAILY 09/05/23   Mallipeddi, Vishnu P, MD  gabapentin (NEURONTIN) 600 MG tablet Take 600 mg by mouth at bedtime. 05/18/23   [provider]  ibuprofen (ADVIL) 600 MG tablet Take 600 mg by mouth every 4 (four) hours as needed. 12/13/22   [provider]  insulin glargine (LANTUS SOLOSTAR) 100 UNIT/ML Solostar Pen Inject 70 Units into the skin at bedtime. 08/09/23   Dani Gobble, NP  insulin lispro (HUMALOG) 100 UNIT/ML KwikPen Inject 10-16 Units into the skin 3 (three) times daily. 07/29/23   Dani Gobble, NP  Insulin Pen Needle (CAREFINE PEN NEEDLES) 31G X 6 MM MISC Use to inject insulin 4 times daily 03/02/23   Dani Gobble, NP  ivabradine (CORLANOR) 7.5 MG TABS tablet Take one tablet the night before procedure and one tablet the morning of procedure 08/23/23   Mallipeddi, Vishnu P, MD  metFORMIN (GLUCOPHAGE-XR) 500 MG 24 hr tablet Take 1 tablet (500 mg total) by mouth daily with breakfast. 03/02/23   Dani Gobble, NP  metoprolol tartrate (LOPRESSOR) 25 MG tablet TAKE 1 TABLET BY MOUTH TWICE DAILY 07/08/23   Mallipeddi, Vishnu P, MD  nitroGLYCERIN (NITROSTAT) 0.4 MG SL tablet Place 1 tablet (0.4 mg  total) under the tongue every 5 (five) minutes x 3 doses as needed (if no relief after 3rd dose proceed to ED or call 911). 08/09/23   Mallipeddi, Vishnu P, MD  NON FORMULARY Pt uses a cpap nightly    [provider]  omega-3 acid ethyl esters (LOVAZA) 1 g capsule Take 2 g by mouth 2 (two) times daily. 10/22/22   [provider]  omeprazole (PRILOSEC) 40 MG capsule Take 1 capsule (40 mg total) by mouth daily. 02/18/23   Rourk, Gerrit Friends, MD  ondansetron (ZOFRAN-ODT) 4 MG disintegrating tablet TAKE 1 TABLET BY MOUTH EVERY 8 HOURS AS NEEDED FOR NAUSEA AND VOMITING Patient taking  differently: No sig reported 08/31/22   Rourk, Gerrit Friends, MD  Pancrelipase, Lip-Prot-Amyl, (ZENPEP) 40000-126000 units CPEP TAKE TWO CAPSULES BY MOUTH THREE TIMES DAILY WITH MEALS AND 1 CAPSULE WITH AT least TWO snacks 08/31/23   Rourk, Gerrit Friends, MD  rosuvastatin (CRESTOR) 10 MG tablet Take 1 tablet (10 mg total) by mouth at bedtime. 04/26/23   Mallipeddi, Vishnu P, MD  traZODone (DESYREL) 100 MG tablet Take 4 tablets (400 mg total) by mouth at bedtime. 07/01/23   Plovsky, Earvin Hansen, MD  valsartan (DIOVAN) 80 MG tablet Take 1 tablet (80 mg total) by mouth daily. 06/28/23   Nyoka Cowden, MD      Allergies    Codeine, Ozempic (0.25 or 0.5 mg-dose) [semaglutide(0.25 or 0.5mg -dos)], and Morphine and codeine    Review of Systems   Review of Systems  Gastrointestinal:  Positive for abdominal pain.    Physical Exam Updated Vital Signs BP 111/67 (BP Location: Right Arm)   Pulse 90   Temp 98.2 F (36.8 C) (Oral)   Resp 19   Ht 5\' 3"  (1.6 m)   Wt 102.5 kg   LMP  (LMP Unknown)   SpO2 99%   BMI 40.03 kg/m  Physical Exam Vitals and nursing note reviewed.  Constitutional:      General: She is not in acute distress.    Appearance: She is well-developed.  HENT:     Head: Normocephalic and atraumatic.  Eyes:     Conjunctiva/sclera: Conjunctivae normal.  Cardiovascular:     Rate and Rhythm: Normal rate and regular rhythm.     Heart sounds: No murmur heard. Pulmonary:     Effort: Pulmonary effort is normal. No respiratory distress.     Breath sounds: Normal breath sounds.  Abdominal:     Palpations: Abdomen is soft.     Tenderness: There is abdominal tenderness in the left upper quadrant and left lower quadrant.  Musculoskeletal:        General: No swelling.     Cervical back: Neck supple.  Skin:    General: Skin is warm and dry.     Capillary Refill: Capillary refill takes less than 2 seconds.  Neurological:     General: No focal deficit present.     Mental Status: She is alert.   Psychiatric:        Mood and Affect: Mood normal.     ED Results / Procedures / Treatments   Labs (all labs ordered are listed, but only abnormal results are displayed) Labs Reviewed  CBC - Abnormal; Notable for the following components:      Result Value   Platelets 147 (*)    All other components within normal limits  COMPREHENSIVE METABOLIC PANEL - Abnormal; Notable for the following components:   Sodium 134 (*)    Chloride 97 (*)  Glucose, Bld 388 (*)    BUN 24 (*)    All other components within normal limits  RESP PANEL BY RT-PCR (RSV, FLU A&B, COVID)  RVPGX2  LIPASE, BLOOD    EKG None  Radiology No results found.  Procedures Procedures    Medications Ordered in ED Medications  ketorolac (TORADOL) 15 MG/ML injection 15 mg (has no administration in time range)  lactated ringers bolus 1,000 mL (1,000 mLs Intravenous New Bag/Given 09/19/23 1250)    ED Course/ Medical Decision Making/ A&P                                 Medical Decision Making This patient presents to the ED for concern of left flank and left lower quadrant abdominal pain, this involves an extensive number of treatment options, and is a complaint that carries with it a high risk of complications and morbidity.  The differential diagnosis includes gastritis, gastroenteritis, appendicitis,, diverticulitis, DKA, nephrolithiasis, gastroparesis, other    Co morbidities that complicate the patient evaluation  Diabetes, sleep apnea, bipolar disorder, pancreatitis   Additional history obtained:  Additional history obtained from EMR External records from outside source obtained and reviewed including prior labs and imaging   Lab Tests:  I Ordered, and personally interpreted labs.  The pertinent results include: CBC shows slightly decreased platelets similar to previous, no anemia, CMP shows hyperglycemia with glucose 388 but normal anion gap.  BUN slightly elevated 24.   Imaging Studies  ordered:  I ordered imaging studies including CT abdomen pelvis I independently visualized and interpreted imaging which showed fatty liver, no acute intra-abdominal process I agree with the radiologist interpretation     Problem List / ED Course / Critical interventions / Medication management  Left-sided abdominal pain for several days-patient has tenderness on palpation, reports it feels like prior pancreatitis, although lipase is normal and CT does not show evidence of pancreatitis.  She feels much better after Toradol.  She has been tolerating p.o. at home.  Her vitals are reassuring.  Aside from hyperglycemia labs are also reassuring.  She was given IV fluids to help bring down her sugar and advised to follow-up with her PCP for her blood blood sugars.  She does not have dermatomal rash in the area of her pain on her flank and abdomen to suggest her pain is caused by zoster.  Denies any injury or trauma, though discussed with patient could still be musculoskeletal.  She is already on PPIs and pain does not change with eating so feel this unlikely related to GERD or gastritis.  Advised to follow-up with GI, given strict return precautions.  I have reviewed the patients home medicines and have made adjustments as needed     Recheck of glucose, sugar now 266, discussed this with patient, she states she is working on her blood sugars with her endocrinologist actively.  Amount and/or Complexity of Data Reviewed Labs: ordered. Radiology: ordered.  Risk Prescription drug management.           Final Clinical Impression(s) / ED Diagnoses Final diagnoses:  None    Rx / DC Orders ED Discharge Orders     None         Ma Rings, PA-C 09/19/23 [address], [place] 09/19/23 1653    Durwin Glaze, MD 09/20/23 [phone]

## 2023-09-19 NOTE — ED Notes (Signed)
 Transported to CT

## 2023-09-19 NOTE — Discharge Instructions (Addendum)
 It was a pleasure taking care of you today.  You evaluated for abdominal pain.  He did not have pancreatitis, your CT scan showed this to cyst on your pancreas have decreased in size.  There were no other acute abnormalities.  It did show fatty liver.  Please follow-up with PCP and GI doctor.  Come back to the ER for new or worsening symptoms.  As we discussed, we do not have a definite cause for your pain today.  Continue taking your Protonix.  You are prescribed Bentyl which helps decrease intestinal cramping.  Also you are given topical lidocaine patches which could help with this if it is a pulled muscle.

## 2023-09-26 ENCOUNTER — Telehealth: Payer: Self-pay | Admitting: Internal Medicine

## 2023-09-26 ENCOUNTER — Ambulatory Visit: Payer: Medicare Other | Admitting: Internal Medicine

## 2023-09-26 NOTE — Telephone Encounter (Signed)
Patient states she is returning a call. 

## 2023-09-26 NOTE — Telephone Encounter (Signed)
 Informed patient of tomorrows appointment advised patient we have not called but we did send mycahrt message for med rec

## 2023-09-27 ENCOUNTER — Telehealth: Payer: Self-pay | Admitting: Nurse Practitioner

## 2023-09-27 ENCOUNTER — Ambulatory Visit: Payer: Medicare Other | Attending: Nurse Practitioner | Admitting: Nurse Practitioner

## 2023-09-27 ENCOUNTER — Encounter: Payer: Self-pay | Admitting: Nurse Practitioner

## 2023-09-27 VITALS — BP 122/70 | HR 95 | Ht 63.0 in | Wt 223.8 lb

## 2023-09-27 DIAGNOSIS — E785 Hyperlipidemia, unspecified: Secondary | ICD-10-CM | POA: Diagnosis not present

## 2023-09-27 DIAGNOSIS — R079 Chest pain, unspecified: Secondary | ICD-10-CM

## 2023-09-27 DIAGNOSIS — E781 Pure hyperglyceridemia: Secondary | ICD-10-CM

## 2023-09-27 DIAGNOSIS — E669 Obesity, unspecified: Secondary | ICD-10-CM

## 2023-09-27 DIAGNOSIS — Z01810 Encounter for preprocedural cardiovascular examination: Secondary | ICD-10-CM

## 2023-09-27 DIAGNOSIS — G4733 Obstructive sleep apnea (adult) (pediatric): Secondary | ICD-10-CM

## 2023-09-27 DIAGNOSIS — I10 Essential (primary) hypertension: Secondary | ICD-10-CM | POA: Diagnosis not present

## 2023-09-27 NOTE — Patient Instructions (Signed)
 Medication Instructions:  Your physician recommends that you continue on your current medications as directed. Please refer to the Current Medication list given to you today.  Labwork: None   Testing/Procedures:  [place] [facility] DEPT OF [facility] [address] [place] [address] Dept: [phone] Loc: [phone]  [facility]  09/27/2023  You are scheduled for a Cardiac Catheterization on Monday, March 10 with Dr.  Jacinto Halim .  1. Please arrive at the [facility] (Main Entrance A) at [facility]: [address] [place], [place] [address] at 5:30 AM (This time is 2 hour(s) before your procedure to ensure your preparation).   Free valet parking service is available. You will check in at ADMITTING. The support person will be asked to wait in the waiting room.  It is OK to have someone drop you off and come back when you are ready to be discharged.    Special note: Every effort is made to have your procedure done on time. Please understand that emergencies sometimes delay scheduled procedures.  2. Diet: Do not eat solid foods after midnight.  The patient may have clear liquids until 5am upon the day of the procedure.  4. Medication instructions in preparation for your procedure:   Contrast Allergy: No THE MORNING OF YOUR TEST PLEASE HOLD KLONOPIN / LASIX/ HUMALOG/ METFORMIN  THE NIGHT BEFORE PLEASE HOLD LANTUS AND METFORMIN   On the morning of your procedure, take your Aspirin 81 mg and any morning medicines NOT listed above.  You may use sips of water.  5. Plan to go home the same day, you will only stay overnight if medically necessary. 6. Bring a current list of your medications and current insurance cards. 7. You MUST have a responsible person to drive you home. 8. Someone MUST be with you the first 24 hours after you arrive home or your discharge will be delayed. 9. Please wear clothes that are  easy to get on and off and wear slip-on shoes.  Thank you for allowing [place] to care for you!   -- [facility]  Follow-Up: Your physician recommends that you schedule a follow-up appointment in: 3-4 weeks after cath   Any Other Special Instructions Will Be Listed Below (If Applicable).  If you need a refill on your cardiac medications before your next appointment, please call your pharmacy.

## 2023-09-27 NOTE — Telephone Encounter (Signed)
 Checking percert on the following patient for   [facility] 10/03/23 With Dr.Ganji Arrive 5:30 CATH 7:30 .

## 2023-09-27 NOTE — H&P (View-Only) (Signed)
 Cardiology Office Note:  .   Date: 09/27/2023 ID:  Jamie [name], DOB Dec 05, 1970, MRN [id number] PCP: Jamie [name]  [facility] Providers Cardiologist:  Jamie Bicker, MD    History of Present Illness: Jamie [name] Kitchen   Jamie [name] is a 53 y.o. female with a PMH of chest pain, HTN, HLD, T2DM, DOE, hypertriglyceridemia, hx of pancreatitis, hepatic steatosis, and morbid obesity, and OSA on CPAP, who presents today for follow-up.   TTE and NST in 2021 were unremarkable.   Last seen by Jamie [name] on 08/09/2023. She noted exertional, ongoing chest tightness for 6 months to 1 year, around 2 times per month, lasting minutes and resolving with rest.   CCTA 08/2023 revealed coronary calcium score of 320.  CT FFR analysis showed significant focal lesion along mid LAD (FFR 0.74). Based on this result, Jamie [name] recommended close follow-up in outpatient clinic to re-evaluate symptoms and set up for [facility]   Today she presents for follow-up. She states she has been getting over strep throat recently, just finished her antibiotic for this. Continues to note exertional, stable ongoing chest tightness. Denies any shortness of breath, palpitations, syncope, presyncope, dizziness, orthopnea, PND, swelling or significant weight changes, acute bleeding, or claudication.   ROS: Negative. See HPI.  SH: Her husband is also a patient of mine.   Studies Reviewed: Jamie [name] Kitchen    EKG:  EKG Interpretation Date/Time:  Tuesday September 27 2023 14:16:01 EST Ventricular Rate:  92 PR Interval:  174 QRS Duration:  82 QT Interval:  378 QTC Calculation: 467 R Axis:   53  Text Interpretation: Normal sinus rhythm Low voltage QRS When compared with ECG of 04-Jun-2023 15:39, PREVIOUS ECG IS PRESENT Confirmed by Jamie [name]) on 09/27/2023 2:20:37 PM   CCTA 08/2023:  IMPRESSION: 1. Moderate stenosis, CADRADS = 3. CT FFR will be performed and reported separately.   2. Coronary calcium score of 320. This  was 99th percentile for age-, sex-, and race- matched controls.   3. Total plaque volume 305 mm3 which is 86th percentile for age- and sex- matched controls (calcified plaque 85 mm3; noncalcified plaque 220 mm3). Total plaque volume is mild/moderate/severe/extensive.   4. Normal coronary origin with right dominance.   INTERPRETATION:   CAD-RADS 3: Moderate stenosis (50-69%). Consider symptom-guided anti-ischemic pharmacotherapy as well as risk factor modification per guideline directed care. Additional analysis with CT FFR will be submitted.  IMPRESSION: 1. CT FFR analysis shows significant focal stenosis in mid LAD (FFR 0.74).  IMPRESSION: 1. No acute or unexpected findings in the chest. 2. Hepatic steatosis.  Echo 02/2023:  1. Left ventricular ejection fraction, by estimation, is 60 to 65%. The  left ventricle has normal function. The left ventricle has no regional  wall motion abnormalities. Left ventricular diastolic parameters were  normal.   2. Right ventricular systolic function is normal. The right ventricular  size is normal.   3. The mitral valve is grossly normal. No evidence of mitral valve  regurgitation. No evidence of mitral stenosis.   4. The aortic valve was not well visualized. Aortic valve regurgitation  is not visualized. No aortic stenosis is present.   Comparison(s): No significant change from prior study.  Lexiscan 03/2020:  The left ventricular ejection fraction is normal (55-65%). Nuclear stress EF: 59%. There was no ST segment deviation noted during stress. The study is normal. This is a low risk study.   Risk Assessment/Calculations:       The 10-year ASCVD  risk score (Arnett DK, et al., 2019) is: 10%   Values used to calculate the score:     Age: 59 years     Sex: Female     Is Non-Hispanic African American: No     Diabetic: Yes     Tobacco smoker: No     Systolic Blood Pressure: 116 mmHg     Is BP treated: Yes     HDL Cholesterol: 24  mg/dL     Total Cholesterol: 249 mg/dL  Physical Exam:   VS:  BP 122/70   Pulse 95   Ht 5\' 3"  (1.6 m)   Wt 223 lb 12.8 oz (101.5 kg)   LMP  (LMP Unknown)   SpO2 97%   BMI 39.64 kg/m    Wt Readings from Last 3 Encounters:  09/27/23 223 lb 12.8 oz (101.5 kg)  09/19/23 226 lb (102.5 kg)  08/09/23 228 lb (103.4 kg)    GEN: Obese, 53 y.o. female in no acute distress NECK: No JVD; No carotid bruits CARDIAC: S1/S2, RRR, no murmurs, rubs, gallops RESPIRATORY:  Clear to auscultation without rales, wheezing or rhonchi  ABDOMEN: Soft, non-tender, non-distended EXTREMITIES:  No edema; No deformity   ASSESSMENT AND PLAN: .    Exertional chest pain, pre-op exam Continues to note stable CP, recent CCTA showed moderate stenosis along mLAD, FFR 0.74. EKG is reassuring today. Discussed results with patient and discussed treatment options. Reviewed Dr. Antoine [name] result note from CCTA that recommended to re-evaluate symptoms at follow-up and "set up for [facility]." Discussed cardiac catheterization with patient including risks and benefits as outlined below, and she verbalized understanding and is agreeable to proceed. Will arrange. Recent labs have been performed including CBC and CMET. Plt count noted to be 147, possible lab error. Denies any bleeding/bruising issues. Discussed which medications to hold before procedure. Continue current medication regimen. Heart healthy diet and regular cardiovascular exercise encouraged. Care and ED precautions discussed. Orders are under signed and held.     Informed Consent   Shared Decision Making/Informed Consent The risks [stroke (1 in 1000), death (1 in 1000), kidney failure [usually temporary] (1 in 500), bleeding (1 in 200), allergic reaction [possibly serious] (1 in 200)], benefits (diagnostic support and management of coronary artery disease) and alternatives of a cardiac catheterization were discussed in detail with Jamie [name] and she is willing to  proceed.  2. HTN BP stable. Discussed to monitor BP at home at least 2 hours after medications and sitting for 5-10 minutes. Continue current medication regimen. Heart healthy diet and regular cardiovascular exercise encouraged.   3. HLD, hypertriglyceridemia She has pending labs arranged. Continue current medication regimen. Heart healthy diet and regular cardiovascular exercise encouraged.   4. Obesity Weight loss via diet and exercise encouraged. Discussed the impact being overweight would have on cardiovascular risk.  5. OSA on CPAP Encouraged continued compliance.   Dispo: Follow-up with me/APP in 3-4 weeks post cardiac catheterization or sooner if anything changes.   Signed, Jamie Dory, NP

## 2023-09-27 NOTE — Progress Notes (Unsigned)
 Cardiology Office Note:  .   Date: 09/27/2023 ID:  Jamie [name], DOB Dec 05, 1970, MRN [id number] PCP: Jamie [name]  [facility] Providers Cardiologist:  Jamie Bicker, MD    History of Present Illness: Jamie [name] Kitchen   Jamie [name] is a 53 y.o. female with a PMH of chest pain, HTN, HLD, T2DM, DOE, hypertriglyceridemia, hx of pancreatitis, hepatic steatosis, and morbid obesity, and OSA on CPAP, who presents today for follow-up.   TTE and NST in 2021 were unremarkable.   Last seen by Jamie [name] on 08/09/2023. She noted exertional, ongoing chest tightness for 6 months to 1 year, around 2 times per month, lasting minutes and resolving with rest.   CCTA 08/2023 revealed coronary calcium score of 320.  CT FFR analysis showed significant focal lesion along mid LAD (FFR 0.74). Based on this result, Jamie [name] recommended close follow-up in outpatient clinic to re-evaluate symptoms and set up for Jamie [name] (Jamie [name]).   Today she presents for follow-up. She states she has been getting over strep throat recently, just finished her antibiotic for this. Continues to note exertional, stable ongoing chest tightness. Denies any shortness of breath, palpitations, syncope, presyncope, dizziness, orthopnea, PND, swelling or significant weight changes, acute bleeding, or claudication.   ROS: Negative. See HPI.  SH: Her husband is also a patient of mine.   Studies Reviewed: Jamie [name] Kitchen    EKG:  EKG Interpretation Date/Time:  Tuesday September 27 2023 14:16:01 EST Ventricular Rate:  92 PR Interval:  174 QRS Duration:  82 QT Interval:  378 QTC Calculation: 467 R Axis:   53  Text Interpretation: Normal sinus rhythm Low voltage QRS When compared with ECG of 04-Jun-2023 15:39, PREVIOUS ECG IS PRESENT Confirmed by Jamie [name]) on 09/27/2023 2:20:37 PM   CCTA 08/2023:  IMPRESSION: 1. Moderate stenosis, CADRADS = 3. CT FFR will be performed and reported separately.   2. Coronary calcium score of 320. This  was 99th percentile for age-, sex-, and race- matched controls.   3. Total plaque volume 305 mm3 which is 86th percentile for age- and sex- matched controls (calcified plaque 85 mm3; noncalcified plaque 220 mm3). Total plaque volume is mild/moderate/severe/extensive.   4. Normal coronary origin with right dominance.   INTERPRETATION:   CAD-RADS 3: Moderate stenosis (50-69%). Consider symptom-guided anti-ischemic pharmacotherapy as well as risk factor modification per guideline directed care. Additional analysis with CT FFR will be submitted.  IMPRESSION: 1. CT FFR analysis shows significant focal stenosis in mid LAD (FFR 0.74).  IMPRESSION: 1. No acute or unexpected findings in the chest. 2. Hepatic steatosis.  Echo 02/2023:  1. Left ventricular ejection fraction, by estimation, is 60 to 65%. The  left ventricle has normal function. The left ventricle has no regional  wall motion abnormalities. Left ventricular diastolic parameters were  normal.   2. Right ventricular systolic function is normal. The right ventricular  size is normal.   3. The mitral valve is grossly normal. No evidence of mitral valve  regurgitation. No evidence of mitral stenosis.   4. The aortic valve was not well visualized. Aortic valve regurgitation  is not visualized. No aortic stenosis is present.   Comparison(s): No significant change from prior study.  Lexiscan 03/2020:  The left ventricular ejection fraction is normal (55-65%). Nuclear stress EF: 59%. There was no ST segment deviation noted during stress. The study is normal. This is a low risk study.   Risk Assessment/Calculations:       The 10-year ASCVD  risk score (Arnett DK, et al., 2019) is: 10%   Values used to calculate the score:     Age: 59 years     Sex: Female     Is Non-Hispanic African American: No     Diabetic: Yes     Tobacco smoker: No     Systolic Blood Pressure: 116 mmHg     Is BP treated: Yes     HDL Cholesterol: 24  mg/dL     Total Cholesterol: 249 mg/dL  Physical Exam:   VS:  BP 122/70   Pulse 95   Ht 5\' 3"  (1.6 m)   Wt 223 lb 12.8 oz (101.5 kg)   LMP  (LMP Unknown)   SpO2 97%   BMI 39.64 kg/m    Wt Readings from Last 3 Encounters:  09/27/23 223 lb 12.8 oz (101.5 kg)  09/19/23 226 lb (102.5 kg)  08/09/23 228 lb (103.4 kg)    GEN: Obese, 53 y.o. female in no acute distress NECK: No JVD; No carotid bruits CARDIAC: S1/S2, RRR, no murmurs, rubs, gallops RESPIRATORY:  Clear to auscultation without rales, wheezing or rhonchi  ABDOMEN: Soft, non-tender, non-distended EXTREMITIES:  No edema; No deformity   ASSESSMENT AND PLAN: .    Exertional chest pain, pre-op exam Continues to note stable CP, recent CCTA showed moderate stenosis along mLAD, FFR 0.74. EKG is reassuring today. Discussed results with patient and discussed treatment options. Reviewed Dr. Antoine Poche result note from CCTA that recommended to re-evaluate symptoms at follow-up and "set up for [name]." Discussed cardiac catheterization with patient including risks and benefits as outlined below, and she verbalized understanding and is agreeable to proceed. Will arrange. Recent labs have been performed including CBC and CMET. Plt count noted to be 147, possible lab error. Denies any bleeding/bruising issues. Discussed which medications to hold before procedure. Continue current medication regimen. Heart healthy diet and regular cardiovascular exercise encouraged. Care and ED precautions discussed. Orders are under signed and held.     Informed Consent   Shared Decision Making/Informed Consent The risks [stroke (1 in 1000), death (1 in 1000), kidney failure [usually temporary] (1 in 500), bleeding (1 in 200), allergic reaction [possibly serious] (1 in 200)], benefits (diagnostic support and management of coronary artery disease) and alternatives of a cardiac catheterization were discussed in detail with Jamie [name] and she is willing to  proceed.  2. HTN BP stable. Discussed to monitor BP at home at least 2 hours after medications and sitting for 5-10 minutes. Continue current medication regimen. Heart healthy diet and regular cardiovascular exercise encouraged.   3. HLD, hypertriglyceridemia She has pending labs arranged. Continue current medication regimen. Heart healthy diet and regular cardiovascular exercise encouraged.   4. Obesity Weight loss via diet and exercise encouraged. Discussed the impact being overweight would have on cardiovascular risk.  5. OSA on CPAP Encouraged continued compliance.   Dispo: Follow-up with me/[facility] in 3-4 weeks post cardiac catheterization or sooner if anything changes.   Signed, Jamie Dory, NP

## 2023-09-28 ENCOUNTER — Ambulatory Visit (HOSPITAL_COMMUNITY): Payer: Medicare Other | Admitting: Psychiatry

## 2023-09-28 ENCOUNTER — Encounter (HOSPITAL_COMMUNITY): Payer: Self-pay | Admitting: Psychiatry

## 2023-09-28 ENCOUNTER — Other Ambulatory Visit: Payer: Self-pay

## 2023-09-28 VITALS — BP 116/78 | HR 101 | Ht 63.0 in | Wt 223.0 lb

## 2023-09-28 DIAGNOSIS — F250 Schizoaffective disorder, bipolar type: Secondary | ICD-10-CM | POA: Diagnosis not present

## 2023-09-28 DIAGNOSIS — F323 Major depressive disorder, single episode, severe with psychotic features: Secondary | ICD-10-CM

## 2023-09-28 DIAGNOSIS — F339 Major depressive disorder, recurrent, unspecified: Secondary | ICD-10-CM

## 2023-09-28 MED ORDER — DULOXETINE HCL 60 MG PO CPEP: 120.0000 mg | ORAL_CAPSULE | Freq: Every day | ORAL | 4 refills | Status: DC

## 2023-09-28 MED ORDER — BUPROPION HCL ER (XL) 150 MG PO TB24: 450.0000 mg | ORAL_TABLET | Freq: Every day | ORAL | 5 refills | Status: DC

## 2023-09-28 MED ORDER — CLONAZEPAM 1 MG PO TABS: ORAL_TABLET | ORAL | 4 refills | Status: DC

## 2023-09-28 MED ORDER — TRAZODONE HCL 100 MG PO TABS: 400.0000 mg | ORAL_TABLET | Freq: Every day | ORAL | 1 refills | Status: DC

## 2023-09-28 MED ORDER — ARIPIPRAZOLE 20 MG PO TABS: 20.0000 mg | ORAL_TABLET | Freq: Every day | ORAL | 1 refills | Status: DC

## 2023-09-28 NOTE — Progress Notes (Signed)
 Patient ID: Jamie [name], female   DOB: 05-14-1971, 53 y.o.   MRN: [id number] [facility] MD Progress Note  09/28/2023 2:45 PM Jamie [name]  MRN:  [id number] Subjective:  Feeling well. Principal Problem: Major depression recurrent   Today emotionally the patient is doing well.  However still been having some chest discomfort over the last few weeks and recently had calcium CAT scan that showed an abnormality.  She has a cardiac catheterization scheduled in 3 days.  The patient has concerns mildly anxious about this.  She denies daily depression.  She is actually sleeping and eating well and has good energy.  She sees her therapist only about once every month or 2.  Her significant other [name] is doing well.  The patient takes her medicines as prescribed.  She drinks no alcohol and uses no drugs.  Her mood is stable. Virtual Visit via Telephone Note  I connected with Jamie [name] on 05/13/2021 at  2:30 PM EST by telephone and verified that I am speaking with the correct person using two identifiers.  Location: Patient: home Provider: office   I discussed the limitations, risks, security and privacy concerns of performing an evaluation and management service by telephone and the availability of in person appointments. I also discussed with the patient that there may be a patient responsible charge related to this service. The patient expressed understanding and agreed to proceed.    I discussed the assessment and treatment plan with the patient. The patient was provided an opportunity to ask questions and all were answered. The patient agreed with the plan and demonstrated an understanding of the instructions.   The patient was advised to call back or seek an in-person evaluation if the symptoms worsen or if the condition fails to improve as anticipated.  I provided 30 minutes of non-face-to-face time during this encounter.   [name], MD   Past Medical History:  Diagnosis  Date   Anxiety    Arthritis    knees   Bipolar 1 disorder (HCC)    Chronic diarrhea    Diverticulosis of colon    GERD (gastroesophageal reflux disease)    Hiatal hernia    History of concussion    per pt 11/ 2020 w/ no residual   Hyperlipidemia    Hypertension    followed by pcp   (11-29-2019  per pt stated never had stress test)   MDD (major depressive disorder)    Migraine    neurologist--- dr Terrace Arabia   OSA on CPAP    followed by dr dohmeier   PCOS (polycystic ovarian syndrome)    PONV (postoperative nausea and vomiting)    Type 2 diabetes mellitus (HCC)    followed by pcp   (11-29-2019  pt stated checks blood sugar daily in am,  fasting sugar--- 96    Past Surgical History:  Procedure Laterality Date   ANAL FISSURE REPAIR  x3  last one  2007  approx.   BIOPSY  09/06/2019   Procedure: BIOPSY;  Surgeon: [name], MD;  Location: [facility];  Service: Endoscopy;;   COLONOSCOPY WITH PROPOFOL [name] 09/06/2019   pancolonic diverticulosis. Segmental biopsy completed with benign biopsies.    ESOPHAGOGASTRODUODENOSCOPY (EGD) WITH PROPOFOL [name] 06/16/2020   Dr. [name]: normal esophagus s/p dilation, normal stomach, duodenum.    ESOPHAGOGASTRODUODENOSCOPY (EGD) WITH PROPOFOL [name] 02/10/2022   Procedure: ESOPHAGOGASTRODUODENOSCOPY (EGD) WITH PROPOFOL;  Surgeon: [name], MD;  Location: [facility];  Service:  Endoscopy;  Laterality: [name];  9:45am   EVALUATION UNDER ANESTHESIA WITH HEMORRHOIDECTOMY [name] 12/05/2019   Procedure: ANORECTAL EXAM UNDER ANESTHESIA WITH HEMORRHOIDECTOMY, HEMORRHOIDAL LIGATION/PEXY;  Surgeon: [name], MD;  Location:  SURGERY CENTER;  Service: General;  Laterality: [name];  GENERAL AND LOCAL   EVALUATION UNDER ANESTHESIA WITH TEAR DUCT PROBING  infant   KNEE ARTHROSCOPY Right 1994   LAPAROSCOPIC CHOLECYSTECTOMY  08-06-2002  @[facility]    LUMBAR LAMINECTOMY/DECOMPRESSION MICRODISCECTOMY Right 01/22/2014   Procedure: LUMBAR FOUR TO FIVE LUMBAR  LAMINECTOMY/DECOMPRESSION MICRODISCECTOMY 1 LEVEL;  Surgeon: Karn Cassis, MD;  Location: [facility];  Service: Neurosurgery;  Laterality: Right;  Right L45 diskectomy   MALONEY DILATION [name] 06/16/2020   Procedure: Elease [name] DILATION;  Surgeon: [name], MD;  Location: [facility];  Service: Endoscopy;  Laterality: [name];   MALONEY DILATION [name] 02/10/2022   Procedure: Elease [name] DILATION;  Surgeon: [name], MD;  Location: [facility];  Service: Endoscopy;  Laterality: [name];   Family History:  Family History  Problem Relation Age of Onset   COPD Father    Anxiety disorder Maternal Grandmother    Depression Maternal Grandmother    Colon cancer Neg Hx    Colon polyps Neg Hx    Family Psychiatric  History:  Social History:  Social History   Substance and Sexual Activity  Alcohol Use No     Social History   Substance and Sexual Activity  Drug Use Never    Social History   Socioeconomic History   Marital status: Married    Spouse name: Not on file   Number of children: 0   Years of education: college   Highest education level: Not on file  Occupational History   Occupation: disabled  Tobacco Use   Smoking status: Never   Smokeless tobacco: Never  Vaping Use   Vaping status: Never Used  Substance and Sexual Activity   Alcohol use: No   Drug use: Never   Sexual activity: Yes    Partners: Male    Birth control/protection: None  Other Topics Concern   Not on file  Social History Narrative   Patient lives at home alone    Patient is right handed   Patient drinks soda's daily   Social Drivers of Corporate investment banker Strain: Not on file  Food Insecurity: No Food Insecurity (01/27/2023)   Hunger Vital Sign    Worried About Running Out of Food in the Last Year: Never true    Ran Out of Food in the Last Year: Never true  Transportation Needs: No Transportation Needs (01/27/2023)   PRAPARE - Administrator, Civil Service (Medical): No     Lack of Transportation (Non-Medical): No  Physical Activity: Not on file  Stress: Not on file  Social Connections: Not on file   Additional Social History:                         Sleep: Fair  Appetite:  Good  Current Medications: Current Outpatient Medications  Medication Sig Dispense Refill   ACCU-CHEK GUIDE test strip      albuterol (VENTOLIN HFA) 108 (90 Base) MCG/ACT inhaler Inhale 2 puffs into the lungs every 4 (four) hours as needed for wheezing or shortness of breath. 1 each 3   ARIPiprazole (ABILIFY) 20 MG tablet Take 1 tablet (20 mg total) by mouth daily. 90 tablet 1   benzonatate (TESSALON) 100 MG capsule Take  200 mg by mouth every 8 (eight) hours as needed.     buPROPion (WELLBUTRIN XL) 150 MG 24 hr tablet Take 3 tablets (450 mg total) by mouth daily. 90 tablet 5   clonazePAM (KLONOPIN) 1 MG tablet Take 1 mg in the morning and 2 mg at night 90 tablet 4   Continuous Glucose Sensor (DEXCOM G7 SENSOR) MISC USE AS DIRECTED AND CHANGE EVERY 10 DAYS 9 each 0   dicyclomine (BENTYL) 20 MG tablet Take 1 tablet (20 mg total) by mouth 2 (two) times daily. 10 tablet 0   DULoxetine (CYMBALTA) 60 MG capsule Take 2 capsules (120 mg total) by mouth at bedtime. 60 capsule 4   fenofibrate micronized (LOFIBRA) 134 MG capsule Take 1 capsule (134 mg total) by mouth daily. 90 capsule 2   fluticasone (FLONASE) 50 MCG/ACT nasal spray Place 1 spray into both nostrils at bedtime as needed for allergies or rhinitis.      fluticasone furoate-vilanterol (BREO ELLIPTA) 100-25 MCG/ACT AEPB Inhale 1 puff into the lungs every morning. 28 each 11   furosemide (LASIX) 20 MG tablet TAKE 1 TABLET BY MOUTH DAILY 30 tablet 2   gabapentin (NEURONTIN) 600 MG tablet Take 600 mg by mouth at bedtime.     ibuprofen (ADVIL) 600 MG tablet Take 600 mg by mouth every 4 (four) hours as needed.     insulin glargine (LANTUS SOLOSTAR) 100 UNIT/ML Solostar Pen Inject 70 Units into the skin at bedtime. (Patient taking  differently: Inject 80 Units into the skin at bedtime.) 60 mL 1   insulin lispro (HUMALOG) 100 UNIT/ML KwikPen Inject 10-16 Units into the skin 3 (three) times daily. (Patient taking differently: Inject 14-20 Units into the skin 3 (three) times daily.) 45 mL 3   Insulin Pen Needle (CAREFINE PEN NEEDLES) 31G X 6 MM MISC Use to inject insulin 4 times daily 300 each 3   ivabradine (CORLANOR) 7.5 MG TABS tablet Take one tablet the night before procedure and one tablet the morning of procedure (Patient not taking: Reported on 09/27/2023) 2 tablet 0   lidocaine (LIDODERM) 5 % Place 1 patch onto the skin daily. Remove & Discard patch within 12 hours or as directed by MD 30 patch 0   metFORMIN (GLUCOPHAGE-XR) 500 MG 24 hr tablet Take 1 tablet (500 mg total) by mouth daily with breakfast. 90 tablet 3   metoprolol tartrate (LOPRESSOR) 25 MG tablet TAKE 1 TABLET BY MOUTH TWICE DAILY 180 tablet 1   nitroGLYCERIN (NITROSTAT) 0.4 MG SL tablet Place 1 tablet (0.4 mg total) under the tongue every 5 (five) minutes x 3 doses as needed (if no relief after 3rd dose proceed to ED or call 911). 25 tablet 2   NON FORMULARY Pt uses a cpap nightly     omega-3 acid ethyl esters (LOVAZA) 1 g capsule Take 2 g by mouth 2 (two) times daily.     omeprazole (PRILOSEC) 40 MG capsule Take 1 capsule (40 mg total) by mouth daily. 30 capsule 11   ondansetron (ZOFRAN-ODT) 4 MG disintegrating tablet TAKE 1 TABLET BY MOUTH EVERY 8 HOURS AS NEEDED FOR NAUSEA AND VOMITING (Patient taking differently: No sig reported) 90 tablet 3   Pancrelipase, Lip-Prot-Amyl, (ZENPEP) 40000-126000 units CPEP TAKE TWO CAPSULES BY MOUTH THREE TIMES DAILY WITH MEALS AND 1 CAPSULE WITH AT least TWO snacks 270 capsule 3   rosuvastatin (CRESTOR) 10 MG tablet Take 1 tablet (10 mg total) by mouth at bedtime. 90 tablet 1   traZODone (DESYREL)  100 MG tablet Take 4 tablets (400 mg total) by mouth at bedtime. 360 tablet 1   valsartan (DIOVAN) 80 MG tablet Take 1 tablet (80  mg total) by mouth daily. 30 tablet 11   No current facility-administered medications for this visit.    Lab Results: No results found for this or any previous visit (from the past 48 hours).  Physical Findings: AIMS:  , ,  ,  ,    CIWA:    COWS:     Musculoskeletal: Strength & Muscle Tone: within normal limits Gait & Station: normal Patient leans: Right  Psychiatric Specialty Exam: ROS  Blood pressure 116/78, pulse (!) 101, height 5\' 3"  (1.6 m), weight 223 lb (101.2 kg).Body mass index is 39.5 kg/m.  General Appearance: Casual  Eye Contact::  Good  Speech:  Clear and Coherent  Volume:  Normal  Mood:Depressed  Affect:  Appropriate  Today the patient is doing very well. Thought Process:  Coherent  Orientation:  Full (Time, Place, and Person)  Thought Content:  NA and WDL  Suicidal Thoughts:  yes  Homicidal Thoughts:  No  Memory:  Immediate;   NA  Judgement:  Good  Insight:  Good  Psychomotor Activity:  NA  Concentration:  Good  Recall:  Good  Fund of Knowledge:Good  Language: Good  Akathisia:  No  Handed:  Right  AIMS (if indicated):     Assets:  Desire for Improvement  ADL's:  Intact  Cognition: WNL  Sleep:      Treatment Plan Summary: 09/28/2023, 2:45 PM  This patient's diagnosis is schizoaffective disorder.  He will continue taking Wellbutrin and Cymbalta as prescribed.  She also takes Abilify 20 mg.  Her second problem is an adjustment disorder with an anxious mood state.  She takes Klonopin 1 mg 1 in the morning and 2 at night she has been on this agent for many years.  She will continue taking trazodone 400 mg for insomnia at night.  Will needs medications to be doing very well.  She has not been psychiatrically hospitalized in a number of years.  She will return to see me in 3 months.

## 2023-09-29 ENCOUNTER — Telehealth: Payer: Self-pay | Admitting: *Deleted

## 2023-09-29 NOTE — Addendum Note (Signed)
 Addended by: Sharlene Dory on: 09/29/2023 10:06 AM   Modules accepted: Orders

## 2023-09-29 NOTE — Telephone Encounter (Signed)
 Cardiac Catheterization scheduled at [facility] for: Monday October 03, 2023 7:30 AM Arrival time [facility] Main Entrance A at: 5:30 AM  Nothing to eat after midnight prior to procedure, clear liquids until 5 AM day of procedure.  Medication instructions: -Hold:  Insulin-AM of procedure/1/2 usual Insulin dose HS prior to procedure  Metformin-day of procedure and 48 hours post procedure  Lasix-AM of procedure -Other usual morning medications can be taken with sips of water including aspirin 81 mg.  Plan to go home the same day, you will only stay overnight if medically necessary.  You must have responsible adult to drive you home.  Someone must be with you the first 24 hours after you arrive home.  Reviewed procedure instructions with patient.

## 2023-10-03 ENCOUNTER — Encounter (HOSPITAL_COMMUNITY)
Admission: RE | Disposition: A | Discharge status: Home / Self Care | Payer: Self-pay | Source: Home / Self Care | Attending: Cardiology

## 2023-10-03 ENCOUNTER — Ambulatory Visit (HOSPITAL_COMMUNITY)
Admission: RE | Admit: 2023-10-03 | Discharge: 2023-10-03 | Disposition: A | Discharge status: Home / Self Care | Attending: Cardiology | Admitting: Cardiology

## 2023-10-03 ENCOUNTER — Other Ambulatory Visit: Payer: Self-pay

## 2023-10-03 ENCOUNTER — Other Ambulatory Visit (HOSPITAL_COMMUNITY): Payer: Self-pay

## 2023-10-03 DIAGNOSIS — G4733 Obstructive sleep apnea (adult) (pediatric): Secondary | ICD-10-CM | POA: Insufficient documentation

## 2023-10-03 DIAGNOSIS — E781 Pure hyperglyceridemia: Secondary | ICD-10-CM | POA: Diagnosis not present

## 2023-10-03 DIAGNOSIS — E119 Type 2 diabetes mellitus without complications: Secondary | ICD-10-CM | POA: Diagnosis not present

## 2023-10-03 DIAGNOSIS — I2510 Atherosclerotic heart disease of native coronary artery without angina pectoris: Secondary | ICD-10-CM

## 2023-10-03 DIAGNOSIS — Z6839 Body mass index (BMI) 39.0-39.9, adult: Secondary | ICD-10-CM | POA: Insufficient documentation

## 2023-10-03 DIAGNOSIS — Z79899 Other long term (current) drug therapy: Secondary | ICD-10-CM | POA: Insufficient documentation

## 2023-10-03 DIAGNOSIS — K760 Fatty (change of) liver, not elsewhere classified: Secondary | ICD-10-CM | POA: Insufficient documentation

## 2023-10-03 DIAGNOSIS — I10 Essential (primary) hypertension: Secondary | ICD-10-CM | POA: Diagnosis not present

## 2023-10-03 DIAGNOSIS — R079 Chest pain, unspecified: Secondary | ICD-10-CM

## 2023-10-03 DIAGNOSIS — I25119 Atherosclerotic heart disease of native coronary artery with unspecified angina pectoris: Secondary | ICD-10-CM | POA: Insufficient documentation

## 2023-10-03 HISTORY — PX: LEFT HEART CATH AND CORONARY ANGIOGRAPHY: CATH118249

## 2023-10-03 LAB — GLUCOSE, CAPILLARY
Glucose-Capillary: 300 mg/dL — ABNORMAL HIGH (ref 70–99)
Glucose-Capillary: 284 mg/dL — ABNORMAL HIGH (ref 70–99)

## 2023-10-03 SURGERY — LEFT HEART CATH AND CORONARY ANGIOGRAPHY
Anesthesia: LOCAL

## 2023-10-03 MED ORDER — ONDANSETRON HCL 4 MG/2ML IJ SOLN: 4.0000 mg | Freq: Four times a day (QID) | INTRAMUSCULAR | Status: DC | PRN

## 2023-10-03 MED ORDER — VERAPAMIL HCL 2.5 MG/ML IV SOLN
INTRAVENOUS | Status: AC
  Filled 2023-10-03: qty 2

## 2023-10-03 MED ORDER — ACETAMINOPHEN 325 MG PO TABS: 650.0000 mg | ORAL_TABLET | ORAL | Status: DC | PRN

## 2023-10-03 MED ORDER — AMLODIPINE BESYLATE 5 MG PO TABS
5.0000 mg | ORAL_TABLET | Freq: Every day | ORAL | 0 refills | Status: DC
  Filled 2023-10-03 (×2): qty 90, 90d supply, fill #0

## 2023-10-03 MED ORDER — HEPARIN SODIUM (PORCINE) 1000 UNIT/ML IJ SOLN
INTRAMUSCULAR | Status: AC
  Filled 2023-10-03: qty 20

## 2023-10-03 MED ORDER — FENTANYL CITRATE (PF) 100 MCG/2ML IJ SOLN
INTRAMUSCULAR | Status: AC
  Filled 2023-10-03: qty 2

## 2023-10-03 MED ORDER — HYDRALAZINE HCL 20 MG/ML IJ SOLN: 10.0000 mg | INTRAMUSCULAR | Status: DC | PRN

## 2023-10-03 MED ORDER — SODIUM CHLORIDE 0.9 % IV SOLN: 250.0000 mL | INTRAVENOUS | Status: DC | PRN

## 2023-10-03 MED ORDER — MIDAZOLAM HCL 2 MG/2ML IJ SOLN
INTRAMUSCULAR | Status: DC | PRN
  Administered 2023-10-03 (×2): 1 mg via INTRAVENOUS

## 2023-10-03 MED ORDER — LIDOCAINE HCL (PF) 1 % IJ SOLN
INTRAMUSCULAR | Status: DC | PRN
  Administered 2023-10-03: 2 mL via INTRADERMAL

## 2023-10-03 MED ORDER — SODIUM CHLORIDE 0.9 % WEIGHT BASED INFUSION: 3.0000 mL/kg/h | INTRAVENOUS | Status: AC

## 2023-10-03 MED ORDER — SODIUM CHLORIDE 0.9% FLUSH: 3.0000 mL | INTRAVENOUS | Status: DC | PRN

## 2023-10-03 MED ORDER — ASPIRIN 81 MG PO CHEW: 81.0000 mg | CHEWABLE_TABLET | ORAL | Status: DC

## 2023-10-03 MED ORDER — IOHEXOL 350 MG/ML SOLN
INTRAVENOUS | Status: DC | PRN
Start: 2023-10-03 — End: 2023-10-03
  Administered 2023-10-03: 20 mL

## 2023-10-03 MED ORDER — FENTANYL CITRATE (PF) 100 MCG/2ML IJ SOLN
INTRAMUSCULAR | Status: DC | PRN
  Administered 2023-10-03: 50 ug via INTRAVENOUS

## 2023-10-03 MED ORDER — LIDOCAINE HCL (PF) 1 % IJ SOLN
INTRAMUSCULAR | Status: AC
  Filled 2023-10-03: qty 30

## 2023-10-03 MED ORDER — HEPARIN (PORCINE) IN NACL 1000-0.9 UT/500ML-% IV SOLN
INTRAVENOUS | Status: DC | PRN
  Administered 2023-10-03 (×2): 500 mL

## 2023-10-03 MED ORDER — HEPARIN SODIUM (PORCINE) 1000 UNIT/ML IJ SOLN
INTRAMUSCULAR | Status: DC | PRN
  Administered 2023-10-03: 5000 [IU] via INTRAVENOUS

## 2023-10-03 MED ORDER — VERAPAMIL HCL 2.5 MG/ML IV SOLN
INTRAVENOUS | Status: DC | PRN
  Administered 2023-10-03: 10 mL via INTRA_ARTERIAL

## 2023-10-03 MED ORDER — SODIUM CHLORIDE 0.9 % WEIGHT BASED INFUSION: 1.0000 mL/kg/h | INTRAVENOUS | Status: DC

## 2023-10-03 MED ORDER — MIDAZOLAM HCL 2 MG/2ML IJ SOLN
INTRAMUSCULAR | Status: AC
  Filled 2023-10-03: qty 2

## 2023-10-03 SURGICAL SUPPLY — 10 items
CATH INFINITI AMBI 5FR TG (CATHETERS) IMPLANT
DEVICE RAD COMP TR BAND LRG (VASCULAR PRODUCTS) IMPLANT
ELECT DEFIB PAD ADLT CADENCE (PAD) IMPLANT
GLIDESHEATH SLEND A-KIT 6F 22G (SHEATH) IMPLANT
GUIDEWIRE INQWIRE 1.5J.035X260 (WIRE) IMPLANT
INQWIRE 1.5J .035X260CM (WIRE) ×1 IMPLANT
KIT SINGLE USE MANIFOLD (KITS) IMPLANT
KIT SYRINGE INJ CVI SPIKEX1 (MISCELLANEOUS) IMPLANT
PACK CARDIAC CATHETERIZATION (CUSTOM PROCEDURE TRAY) ×1 IMPLANT
SET ATX-X65L (MISCELLANEOUS) IMPLANT

## 2023-10-03 NOTE — Progress Notes (Signed)
 TR band removed at 1005, gauze dressing applied, right radial level 0, clean, dry, and intact. Patient walked to the bathroom without difficulties.

## 2023-10-03 NOTE — Discharge Instructions (Signed)

## 2023-10-03 NOTE — Interval H&P Note (Signed)
 History and Physical Interval Note:  10/03/2023 7:57 AM  Jamie [name]  has presented today for surgery, with the diagnosis of abnormal cta.  The various methods of treatment have been discussed with the patient and family. After consideration of risks, benefits and other options for treatment, the patient has consented to  Procedure(s): LEFT HEART CATH AND CORONARY ANGIOGRAPHY ([name]) and possible coronary angioplasty as a surgical intervention.  The patient's history has been reviewed, patient examined, no change in status, stable for surgery.  I have reviewed the patient's chart and labs.  Questions were answered to the patient's satisfaction.     Yates Decamp

## 2023-10-04 ENCOUNTER — Encounter (HOSPITAL_COMMUNITY): Payer: Self-pay | Admitting: Cardiology

## 2023-10-06 ENCOUNTER — Other Ambulatory Visit: Payer: Self-pay | Admitting: Internal Medicine

## 2023-10-18 ENCOUNTER — Ambulatory Visit: Payer: Medicare Other | Admitting: Pulmonary Disease

## 2023-10-27 ENCOUNTER — Encounter: Payer: Self-pay | Admitting: Gastroenterology

## 2023-10-27 ENCOUNTER — Ambulatory Visit: Payer: Medicare Other | Admitting: Gastroenterology

## 2023-10-27 VITALS — BP 100/63 | HR 90 | Temp 97.9°F | Ht 63.0 in | Wt 227.2 lb

## 2023-10-27 DIAGNOSIS — Z8719 Personal history of other diseases of the digestive system: Secondary | ICD-10-CM | POA: Diagnosis not present

## 2023-10-27 DIAGNOSIS — K219 Gastro-esophageal reflux disease without esophagitis: Secondary | ICD-10-CM

## 2023-10-27 DIAGNOSIS — R1032 Left lower quadrant pain: Secondary | ICD-10-CM

## 2023-10-27 MED ORDER — DICYCLOMINE HCL 10 MG PO CAPS: 10.0000 mg | ORAL_CAPSULE | Freq: Two times a day (BID) | ORAL | 0 refills | Status: AC | PRN

## 2023-10-27 NOTE — Patient Instructions (Addendum)
 I will order a CT scan of your abdomen pelvis that assesses the blood flow to your colon to see if this is the cause of your left lower quadrant pain.  As we discussed I want you to follow-up with your primary care and discuss the GYN evaluation given your history of PCOS.  Sometimes endometriosis, it uterine fibroids, or recurrent cyst can be the cause of left lower quadrant pain.  Given your findings on exam today, it is also very possible that some of your left lower quadrant pain could be referred from your back given the significant tenderness today on exam.  Just in case this is coming from your colon and is some intermittent abdominal spasm, I am giving you 10 mg of dicyclomine to take up to twice a day as needed.  Just as you are aware from previous use, if you begin to experience constipation due to taking this along with Zenpep then please stop and let me know.  Continue your Zenpep and your omeprazole.  Continue Zofran as needed for nausea.  If all findings are negative and you are receiving some muscle relaxers or other pain relief for your back and you are still having issues with left lower quadrant pain, we could consider repeating your colonoscopy.  I will plan to follow-up in 3 months, sooner if needed.  It was a pleasure to see you today. I want to create trusting relationships with patients. If you receive a survey regarding your visit,  I greatly appreciate you taking time to fill this out on paper or through your MyChart. I value your feedback.  Brooke Bonito, MSN, FNP-BC, AGACNP-BC [facility]

## 2023-10-27 NOTE — Progress Notes (Signed)
 GI Office Note    Referring Provider: Joeseph Amor Primary Care Physician:  Joeseph Amor Primary Gastroenterologist: Gerrit Friends.Rourk, MD  Date:  10/27/2023  ID:  Jamie [name], DOB [birth year], MRN [id number]   Chief Complaint   Chief Complaint  Patient presents with   Follow-up    Follow up. Still having problems with stomach    History of Present Illness  Jamie [name] is a 53 y.o. female with a history of GERD, anxiety, HLD, HTN, migraines, PCOS, bipolar 1, depression, and pancreatitis maintained on enzyme replacement presenting today for follow up.   EGD November 2021 - normal esophagus s/p dilation, normal stomach, normal duodenum.   EGD July 2023: -Normal esophagus s/p dilation -Normal stomach -Normal duodenum  Previous workup with fecal elastase in the 435 range.  Last office visit 05/24/23 with Dr. [name].  Presented for recurrent epigastric and left lower quadrant abdominal pain.  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  Her most recent labs have been triglycerides less than thousand.  Prior evaluation negative for biliary etiology although slight bump in alk phos previously.  Denied jaundice, fever, or chills.  Continued on pancreatic enzyme supplements and PPI twice daily.  No further dysphagia since dilation.  IgG4 negative for autoimmune pancreatitis.  UA, CBC, lipase, HFP ordered.  Consider CT scan in the future for evaluation of her abdominal pain.  Advised to stay on low residue diet.  UA October with trace ketones and 1+ leukocytes.  Results forwarded to PCP.  Lipase 38, HFP within normal limits.  CT A/P ordered.  CT A/P with contrast 09/19/2023: -Decreasing size of 2 small complex fluid collections within the gastrosplenic ligament consistent with known pancreatic pseudocyst -Hepatic steatosis -Evidence of cholecystectomy without biliary dilation -Normal stomach, colonic  diverticulosis without diverticulitis   Today:  LLQ pain and it comes and goes - occurs every other day for the most part. No relationship to food or BMs. Feels like a throbbing pain all the time and then it is more of a stabbing pain. Blood pressure have been good. Blood glucose has been in  the 200s, improved from 400s - goes to see endocrinology.   Heat helps it. Walking makes it worse and she has to hold it to feel better. Does have back pain oain as well - has had 2 surgeries.   Does have endometriosis and PCOS. Does have peripheral swelling L>R and is improved in the monring ans with lasix.   Does have some nausea and that can be mid day. Reflux is pretyt well controlled withi once daily omeprazole   Will take ibuprofen for her back pain. Takes 600 mg as needed. Has not been taking dicyclomine.   Never has a regular cycle - has not had one in over 20 years.   Wt Readings from Last 3 Encounters:  10/27/23 227 lb 3.2 oz (103.1 kg)  10/03/23 223 lb (101.2 kg)  09/27/23 223 lb 12.8 oz (101.5 kg)    Current Outpatient Medications  Medication Sig Dispense Refill   ACCU-CHEK GUIDE test strip      albuterol (VENTOLIN HFA) 108 (90 Base) MCG/ACT inhaler Inhale 2 puffs into the lungs every 4 (four) hours as needed for wheezing or shortness of breath. 1 each 3   amLODipine (NORVASC) 5 MG tablet Take 1 tablet (5 mg total) by mouth daily. 90 tablet 0   ARIPiprazole (ABILIFY) 20 MG tablet Take  1 tablet (20 mg total) by mouth daily. 90 tablet 1   buPROPion (WELLBUTRIN XL) 150 MG 24 hr tablet Take 3 tablets (450 mg total) by mouth daily. (Patient taking differently: Take 150-300 mg by mouth See admin instructions. Take 300 mg in the morning and 150 mg at bedtime) 90 tablet 5   clonazePAM (KLONOPIN) 1 MG tablet Take 1 mg in the morning and 2 mg at night 90 tablet 4   Continuous Glucose Sensor (DEXCOM G7 SENSOR) MISC USE AS DIRECTED AND CHANGE EVERY 10 DAYS 9 each 0   DULoxetine (CYMBALTA) 60 MG  capsule Take 2 capsules (120 mg total) by mouth at bedtime. 60 capsule 4   fenofibrate micronized (LOFIBRA) 134 MG capsule Take 1 capsule (134 mg total) by mouth daily. 90 capsule 2   fluticasone (FLONASE) 50 MCG/ACT nasal spray Place 1 spray into both nostrils at bedtime as needed for allergies or rhinitis.      fluticasone furoate-vilanterol (BREO ELLIPTA) 100-25 MCG/ACT AEPB Inhale 1 puff into the lungs every morning. 28 each 11   furosemide (LASIX) 20 MG tablet TAKE 1 TABLET BY MOUTH DAILY 30 tablet 2   gabapentin (NEURONTIN) 600 MG tablet Take 600 mg by mouth at bedtime.     insulin glargine (LANTUS SOLOSTAR) 100 UNIT/ML Solostar Pen Inject 70 Units into the skin at bedtime. (Patient taking differently: Inject 80 Units into the skin at bedtime.) 60 mL 1   insulin lispro (HUMALOG) 100 UNIT/ML KwikPen Inject 10-16 Units into the skin 3 (three) times daily. (Patient taking differently: Inject 14-19 Units into the skin 3 (three) times daily before meals. Sliding scale) 45 mL 3   Insulin Pen Needle (CAREFINE PEN NEEDLES) 31G X 6 MM MISC Use to inject insulin 4 times daily 300 each 3   lidocaine (LIDODERM) 5 % Place 1 patch onto the skin daily. Remove & Discard patch within 12 hours or as directed by MD (Patient taking differently: Place 1 patch onto the skin daily as needed (pain). Remove & Discard patch within 12 hours or as directed by MD) 30 patch 0   metFORMIN (GLUCOPHAGE-XR) 500 MG 24 hr tablet Take 1 tablet (500 mg total) by mouth daily with breakfast. 90 tablet 3   metoprolol tartrate (LOPRESSOR) 25 MG tablet TAKE 1 TABLET BY MOUTH TWICE DAILY 180 tablet 1   Multiple Vitamins-Minerals (MULTIVITAMIN WITH MINERALS) tablet Take 1 tablet by mouth daily.     nitroGLYCERIN (NITROSTAT) 0.4 MG SL tablet Place 1 tablet (0.4 mg total) under the tongue every 5 (five) minutes x 3 doses as needed (if no relief after 3rd dose proceed to ED or call 911). 25 tablet 2   NON FORMULARY Pt uses a cpap nightly      omega-3 acid ethyl esters (LOVAZA) 1 g capsule Take 2 g by mouth 2 (two) times daily.     omeprazole (PRILOSEC) 40 MG capsule Take 1 capsule (40 mg total) by mouth daily. 30 capsule 11   ondansetron (ZOFRAN-ODT) 4 MG disintegrating tablet TAKE 1 TABLET BY MOUTH EVERY 8 HOURS AS NEEDED FOR NAUSEA AND VOMITING (Patient taking differently: No sig reported) 90 tablet 3   Pancrelipase, Lip-Prot-Amyl, (ZENPEP) 40000-126000 units CPEP TAKE TWO CAPSULES BY MOUTH THREE TIMES DAILY WITH MEALS AND 1 CAPSULE WITH AT least TWO snacks 270 capsule 3   rosuvastatin (CRESTOR) 10 MG tablet TAKE 1 TABLET BY MOUTH AT BEDTIME 90 tablet 1   traZODone (DESYREL) 100 MG tablet Take 4 tablets (400 mg total)  by mouth at bedtime. 360 tablet 1   valsartan (DIOVAN) 80 MG tablet Take 1 tablet (80 mg total) by mouth daily. 30 tablet 11   dicyclomine (BENTYL) 20 MG tablet Take 1 tablet (20 mg total) by mouth 2 (two) times daily. (Patient not taking: Reported on 10/27/2023) 10 tablet 0   No current facility-administered medications for this visit.    Past Medical History:  Diagnosis Date   Anxiety    Arthritis    knees   Bipolar 1 disorder (HCC)    Chronic diarrhea    Diverticulosis of colon    GERD (gastroesophageal reflux disease)    Hiatal hernia    History of concussion    per pt 11/ 2020 w/ no residual   Hyperlipidemia    Hypertension    followed by pcp   (11-29-2019  per pt stated never had stress test)   MDD (major depressive disorder)    Migraine    neurologist--- dr Terrace Arabia   OSA on CPAP    followed by dr dohmeier   PCOS (polycystic ovarian syndrome)    PONV (postoperative nausea and vomiting)    Type 2 diabetes mellitus (HCC)    followed by pcp   (11-29-2019  pt stated checks blood sugar daily in am,  fasting sugar--- 96    Past Surgical History:  Procedure Laterality Date   ANAL FISSURE REPAIR  x3  last one  2007  approx.   BIOPSY  09/06/2019   Procedure: BIOPSY;  Surgeon: [name], MD;  Location:  [facility];  Service: Endoscopy;;   COLONOSCOPY WITH PROPOFOL [name] 09/06/2019   pancolonic diverticulosis. Segmental biopsy completed with benign biopsies.    ESOPHAGOGASTRODUODENOSCOPY (EGD) WITH PROPOFOL [name] 06/16/2020   Dr. [name]: normal esophagus s/p dilation, normal stomach, duodenum.    ESOPHAGOGASTRODUODENOSCOPY (EGD) WITH PROPOFOL [name] 02/10/2022   Procedure: ESOPHAGOGASTRODUODENOSCOPY (EGD) WITH PROPOFOL;  Surgeon: [name], MD;  Location: [facility];  Service: Endoscopy;  Laterality: [name];  9:45am   EVALUATION UNDER ANESTHESIA WITH HEMORRHOIDECTOMY [name] 12/05/2019   Procedure: ANORECTAL EXAM UNDER ANESTHESIA WITH HEMORRHOIDECTOMY, HEMORRHOIDAL LIGATION/PEXY;  Surgeon: [name], MD;  Location: [facility];  Service: General;  Laterality: [name];  GENERAL AND LOCAL   EVALUATION UNDER ANESTHESIA WITH TEAR DUCT PROBING  infant   KNEE ARTHROSCOPY Right 1994   LAPAROSCOPIC CHOLECYSTECTOMY  08-06-2002  @[facility]    LEFT HEART CATH AND CORONARY ANGIOGRAPHY [name] 10/03/2023   Procedure: LEFT HEART CATH AND CORONARY ANGIOGRAPHY;  Surgeon: Yates Decamp, MD;  Location: [facility];  Service: Cardiovascular;  Laterality: [name];   LUMBAR LAMINECTOMY/DECOMPRESSION MICRODISCECTOMY Right 01/22/2014   Procedure: LUMBAR FOUR TO FIVE LUMBAR LAMINECTOMY/DECOMPRESSION MICRODISCECTOMY 1 LEVEL;  Surgeon: Karn Cassis, MD;  Location: [facility];  Service: Neurosurgery;  Laterality: Right;  Right L45 diskectomy   MALONEY DILATION [name] 06/16/2020   Procedure: Elease [name] DILATION;  Surgeon: [name], MD;  Location: [facility];  Service: Endoscopy;  Laterality: [name];   MALONEY DILATION [name] 02/10/2022   Procedure: Elease [name] DILATION;  Surgeon: [name], MD;  Location: [facility];  Service: Endoscopy;  Laterality: [name];    Family History  Problem Relation Age of Onset   COPD Father    Anxiety disorder Maternal Grandmother    Depression Maternal Grandmother    Colon cancer Neg Hx     Colon polyps Neg Hx     Allergies as of 10/27/2023 - Review Complete 10/27/2023  Allergen Reaction  Noted   Codeine Hives and Swelling 06/07/2011   Ozempic (0.25 or 0.5 mg-dose) [semaglutide(0.25 or 0.5mg -dos)]  03/02/2023   Morphine and codeine Swelling and Rash 01/21/2014    Social History   Socioeconomic History   Marital status: Married    Spouse name: Not on file   Number of children: 0   Years of education: college   Highest education level: Not on file  Occupational History   Occupation: disabled  Tobacco Use   Smoking status: Never   Smokeless tobacco: Never  Vaping Use   Vaping status: Never Used  Substance and Sexual Activity   Alcohol use: No   Drug use: Never   Sexual activity: Yes    Partners: Male    Birth control/protection: None  Other Topics Concern   Not on file  Social History Narrative   Patient lives at home alone    Patient is right handed   Patient drinks soda's daily   Social Drivers of Corporate investment banker Strain: Not on file  Food Insecurity: No Food Insecurity (01/27/2023)   Hunger Vital Sign    Worried About Running Out of Food in the Last Year: Never true    Ran Out of Food in the Last Year: Never true  Transportation Needs: No Transportation Needs (01/27/2023)   PRAPARE - Administrator, Civil Service (Medical): No    Lack of Transportation (Non-Medical): No  Physical Activity: Not on file  Stress: Not on file  Social Connections: Not on file     Review of Systems   Gen: Denies fever, chills, anorexia. Denies fatigue, weakness, weight loss.  CV: Denies chest pain, palpitations, syncope, peripheral edema, and claudication. Resp: Denies dyspnea at rest, cough, wheezing, coughing up blood, and pleurisy. GI: See HPI Derm: Denies rash, itching, dry skin Psych: + anxiety, depression. Denies memory loss, confusion. No homicidal or suicidal ideation.  Heme: Denies bruising, bleeding, and enlarged lymph  nodes.  Physical Exam   BP 100/63 (BP Location: Right Arm, Patient Position: Sitting, Cuff Size: Large)   Pulse 90   Temp 97.9 F (36.6 C) (Temporal)   Ht 5\' 3"  (1.6 m)   Wt 227 lb 3.2 oz (103.1 kg)   LMP  (LMP Unknown)   BMI 40.25 kg/m   General:   Alert and oriented. No distress noted. Pleasant and cooperative.  Head:  Normocephalic and atraumatic. Eyes:  Conjuctiva clear without scleral icterus. Mouth:  Oral mucosa pink and moist. Good dentition. No lesions. Abdomen:  +BS, soft, non-distended. TTP to left flank and LLQ. No rebound or guarding. No HSM or masses noted. Rectal: deferred Msk:  Symmetrical without gross deformities. Normal posture. Extremities:  Without edema. Neurologic:  Alert and  oriented x4 Psych:  Alert and cooperative. Normal mood and affect.  Assessment  Jamie [name] is a 53 y.o. female with a history of GERD, anxiety, HLD, HTN, migraines, PCOS, bipolar 1, depression, and pancreatitis maintained on enzyme replacement presenting today for follow up.   History of pancreatitis: Multiple prior episodes of interstitial pancreatitis most likely secondary to elevated triglycerides, currently undergoing multiple therapies to control triglycerides. Following with endocrinology. No current epigastric pain. Maintained on 2 capsules of zenpep with meals without issue. No constipation or diarrhea.   GERD: Well controlled with omeprazole once daily. Having occasional nausea, relieved with zofran.   LLQ abdominal pain: Continues to have intermittent pain LLQ pain but also associated with her back pain. Sharp pains occur intermittently but  almost always a slight baseline throbbing. Walking worsens pain. Prior CT images without evidence to explain LLQ pain, no evidence of diverticulitis, previous UA negative for UTI. Unable to rule of GYN cause recommended follow up. Will get CTA to rule out ischemia despite no weight loss or signs of rectal bleeding.   PLAN   GYN  evaluation Repeat colonoscopy if negative CTA A/P to rule out ischemia.  Dicyclomine as needed  Continue zenpep 40K units - 2 tablets with meals.  Continue omeprazole 40 mg once daily.  Zofran as needed.  Follow up in 3 months.     Brooke Bonito, MSN, FNP-BC, AGACNP-BC [facility]

## 2023-10-28 DIAGNOSIS — R5383 Other fatigue: Secondary | ICD-10-CM | POA: Diagnosis not present

## 2023-10-28 DIAGNOSIS — Z0000 Encounter for general adult medical examination without abnormal findings: Secondary | ICD-10-CM | POA: Diagnosis not present

## 2023-10-28 DIAGNOSIS — Z9189 Other specified personal risk factors, not elsewhere classified: Secondary | ICD-10-CM | POA: Diagnosis not present

## 2023-10-28 DIAGNOSIS — E1143 Type 2 diabetes mellitus with diabetic autonomic (poly)neuropathy: Secondary | ICD-10-CM | POA: Diagnosis not present

## 2023-10-28 DIAGNOSIS — K219 Gastro-esophageal reflux disease without esophagitis: Secondary | ICD-10-CM | POA: Diagnosis not present

## 2023-10-28 DIAGNOSIS — E7849 Other hyperlipidemia: Secondary | ICD-10-CM | POA: Diagnosis not present

## 2023-10-28 DIAGNOSIS — I10 Essential (primary) hypertension: Secondary | ICD-10-CM | POA: Diagnosis not present

## 2023-10-28 DIAGNOSIS — Z1389 Encounter for screening for other disorder: Secondary | ICD-10-CM | POA: Diagnosis not present

## 2023-10-31 ENCOUNTER — Ambulatory Visit: Payer: Medicare Other | Admitting: Nurse Practitioner

## 2023-10-31 ENCOUNTER — Encounter: Payer: Self-pay | Admitting: Nurse Practitioner

## 2023-10-31 VITALS — BP 100/70 | HR 76 | Ht 63.0 in | Wt 210.8 lb

## 2023-10-31 DIAGNOSIS — E1165 Type 2 diabetes mellitus with hyperglycemia: Secondary | ICD-10-CM | POA: Diagnosis not present

## 2023-10-31 DIAGNOSIS — Z7984 Long term (current) use of oral hypoglycemic drugs: Secondary | ICD-10-CM | POA: Diagnosis not present

## 2023-10-31 DIAGNOSIS — Z794 Long term (current) use of insulin: Secondary | ICD-10-CM

## 2023-10-31 DIAGNOSIS — I152 Hypertension secondary to endocrine disorders: Secondary | ICD-10-CM | POA: Diagnosis not present

## 2023-10-31 DIAGNOSIS — E782 Mixed hyperlipidemia: Secondary | ICD-10-CM | POA: Diagnosis not present

## 2023-10-31 LAB — POCT GLYCOSYLATED HEMOGLOBIN (HGB A1C): Hemoglobin A1C: 11.7 % — AB (ref 4.0–5.6)

## 2023-10-31 LAB — POCT UA - MICROALBUMIN: Creatinine, POC: 100 mg/dL

## 2023-10-31 MED ORDER — METFORMIN HCL ER 500 MG PO TB24: 500.0000 mg | ORAL_TABLET | Freq: Every day | ORAL | 3 refills | Status: DC

## 2023-10-31 MED ORDER — DEXCOM G7 SENSOR MISC: 3 refills | Status: DC

## 2023-10-31 MED ORDER — LANTUS SOLOSTAR 100 UNIT/ML ~~LOC~~ SOPN: 100.0000 [IU] | PEN_INJECTOR | Freq: Every day | SUBCUTANEOUS | 3 refills | Status: DC

## 2023-10-31 MED ORDER — INSULIN LISPRO (1 UNIT DIAL) 100 UNIT/ML (KWIKPEN)
20.0000 [IU] | PEN_INJECTOR | Freq: Three times a day (TID) | SUBCUTANEOUS | 3 refills | Status: DC
Start: 2023-10-31 — End: 2024-03-01

## 2023-10-31 NOTE — Progress Notes (Signed)
 Endocrinology Follow Up Note       10/31/2023, 3:28 PM   Subjective:    Patient ID: Jamie [name], female    DOB: 12-Mar-1971.  Jamie [name] is being seen in follow up after being seen in consultation for management of currently uncontrolled symptomatic diabetes requested by  Encarnacion Slates, PA-C.   Past Medical History:  Diagnosis Date   Anxiety    Arthritis    knees   Bipolar 1 disorder (HCC)    Chronic diarrhea    Diverticulosis of colon    GERD (gastroesophageal reflux disease)    Hiatal hernia    History of concussion    per pt 11/ 2020 w/ no residual   Hyperlipidemia    Hypertension    followed by pcp   (11-29-2019  per pt stated never had stress test)   MDD (major depressive disorder)    Migraine    neurologist--- dr Terrace Arabia   OSA on CPAP    followed by dr dohmeier   PCOS (polycystic ovarian syndrome)    PONV (postoperative nausea and vomiting)    Type 2 diabetes mellitus (HCC)    followed by pcp   (11-29-2019  pt stated checks blood sugar daily in am,  fasting sugar--- 96    Past Surgical History:  Procedure Laterality Date   ANAL FISSURE REPAIR  x3  last one  2007  approx.   BIOPSY  09/06/2019   Procedure: BIOPSY;  Surgeon: [name], MD;  Location: [facility];  Service: Endoscopy;;   COLONOSCOPY WITH PROPOFOL [name] 09/06/2019   pancolonic diverticulosis. Segmental biopsy completed with benign biopsies.    ESOPHAGOGASTRODUODENOSCOPY (EGD) WITH PROPOFOL [name] 06/16/2020   Dr. [name]: normal esophagus s/p dilation, normal stomach, duodenum.    ESOPHAGOGASTRODUODENOSCOPY (EGD) WITH PROPOFOL [name] 02/10/2022   Procedure: ESOPHAGOGASTRODUODENOSCOPY (EGD) WITH PROPOFOL;  Surgeon: [name], MD;  Location: [facility];  Service: Endoscopy;  Laterality: [name];  9:45am   EVALUATION UNDER ANESTHESIA WITH HEMORRHOIDECTOMY [name] 12/05/2019   Procedure: ANORECTAL EXAM UNDER ANESTHESIA WITH  HEMORRHOIDECTOMY, HEMORRHOIDAL LIGATION/PEXY;  Surgeon: [name], MD;  Location: [facility];  Service: General;  Laterality: [name];  GENERAL AND LOCAL   EVALUATION UNDER ANESTHESIA WITH TEAR DUCT PROBING  infant   KNEE ARTHROSCOPY Right 1994   LAPAROSCOPIC CHOLECYSTECTOMY  08-06-2002  @[facility]    LEFT HEART CATH AND CORONARY ANGIOGRAPHY [name] 10/03/2023   Procedure: LEFT HEART CATH AND CORONARY ANGIOGRAPHY;  Surgeon: Yates Decamp, MD;  Location: [facility];  Service: Cardiovascular;  Laterality: [name];   LUMBAR LAMINECTOMY/DECOMPRESSION MICRODISCECTOMY Right 01/22/2014   Procedure: LUMBAR FOUR TO FIVE LUMBAR LAMINECTOMY/DECOMPRESSION MICRODISCECTOMY 1 LEVEL;  Surgeon: Karn Cassis, MD;  Location: [facility];  Service: Neurosurgery;  Laterality: Right;  Right L45 diskectomy   MALONEY DILATION [name] 06/16/2020   Procedure: Elease [name] DILATION;  Surgeon: [name], MD;  Location: [facility];  Service: Endoscopy;  Laterality: [name];   MALONEY DILATION [name] 02/10/2022   Procedure: Elease [name] DILATION;  Surgeon: [name], MD;  Location: [facility];  Service: Endoscopy;  Laterality: [name];    Social History   Socioeconomic History  Marital status: Married    Spouse name: Not on file   Number of children: 0   Years of education: college   Highest education level: Not on file  Occupational History   Occupation: disabled  Tobacco Use   Smoking status: Never   Smokeless tobacco: Never  Vaping Use   Vaping status: Never Used  Substance and Sexual Activity   Alcohol use: No   Drug use: Never   Sexual activity: Yes    Partners: Male    Birth control/protection: None  Other Topics Concern   Not on file  Social History Narrative   Patient lives at home alone    Patient is right handed   Patient drinks soda's daily   Social Drivers of Corporate investment banker Strain: Not on file  Food Insecurity: No Food Insecurity (01/27/2023)   Hunger Vital Sign    Worried  About Running Out of Food in the Last Year: Never true    Ran Out of Food in the Last Year: Never true  Transportation Needs: No Transportation Needs (01/27/2023)   PRAPARE - Administrator, Civil Service (Medical): No    Lack of Transportation (Non-Medical): No  Physical Activity: Not on file  Stress: Not on file  Social Connections: Not on file    Family History  Problem Relation Age of Onset   COPD Father    Anxiety disorder Maternal Grandmother    Depression Maternal Grandmother    Colon cancer Neg Hx    Colon polyps Neg Hx     Outpatient Encounter Medications as of 10/31/2023  Medication Sig   ACCU-CHEK GUIDE test strip    albuterol (VENTOLIN HFA) 108 (90 Base) MCG/ACT inhaler Inhale 2 puffs into the lungs every 4 (four) hours as needed for wheezing or shortness of breath.   amLODipine (NORVASC) 5 MG tablet Take 1 tablet (5 mg total) by mouth daily.   ARIPiprazole (ABILIFY) 20 MG tablet Take 1 tablet (20 mg total) by mouth daily.   buPROPion (WELLBUTRIN XL) 150 MG 24 hr tablet Take 3 tablets (450 mg total) by mouth daily. (Patient taking differently: Take 150-300 mg by mouth See admin instructions. Take 300 mg in the morning and 150 mg at bedtime)   clonazePAM (KLONOPIN) 1 MG tablet Take 1 mg in the morning and 2 mg at night   dicyclomine (BENTYL) 10 MG capsule Take 1 capsule (10 mg total) by mouth 2 (two) times daily as needed for spasms.   DULoxetine (CYMBALTA) 60 MG capsule Take 2 capsules (120 mg total) by mouth at bedtime.   fenofibrate micronized (LOFIBRA) 134 MG capsule Take 1 capsule (134 mg total) by mouth daily.   fluticasone (FLONASE) 50 MCG/ACT nasal spray Place 1 spray into both nostrils at bedtime as needed for allergies or rhinitis.    fluticasone furoate-vilanterol (BREO ELLIPTA) 100-25 MCG/ACT AEPB Inhale 1 puff into the lungs every morning.   furosemide (LASIX) 20 MG tablet TAKE 1 TABLET BY MOUTH DAILY   gabapentin (NEURONTIN) 600 MG tablet Take 600 mg  by mouth at bedtime.   Insulin Pen Needle (CAREFINE PEN NEEDLES) 31G X 6 MM MISC Use to inject insulin 4 times daily   lidocaine (LIDODERM) 5 % Place 1 patch onto the skin daily. Remove & Discard patch within 12 hours or as directed by MD (Patient taking differently: Place 1 patch onto the skin daily as needed (pain). Remove & Discard patch within 12 hours or as directed by MD)  metoprolol tartrate (LOPRESSOR) 25 MG tablet TAKE 1 TABLET BY MOUTH TWICE DAILY   Multiple Vitamins-Minerals (MULTIVITAMIN WITH MINERALS) tablet Take 1 tablet by mouth daily.   nitroGLYCERIN (NITROSTAT) 0.4 MG SL tablet Place 1 tablet (0.4 mg total) under the tongue every 5 (five) minutes x 3 doses as needed (if no relief after 3rd dose proceed to ED or call 911).   NON FORMULARY Pt uses a cpap nightly   omega-3 acid ethyl esters (LOVAZA) 1 g capsule Take 2 g by mouth 2 (two) times daily.   omeprazole (PRILOSEC) 40 MG capsule Take 1 capsule (40 mg total) by mouth daily.   ondansetron (ZOFRAN-ODT) 4 MG disintegrating tablet TAKE 1 TABLET BY MOUTH EVERY 8 HOURS AS NEEDED FOR NAUSEA AND VOMITING (Patient taking differently: No sig reported)   Pancrelipase, Lip-Prot-Amyl, (ZENPEP) 40000-126000 units CPEP TAKE TWO CAPSULES BY MOUTH THREE TIMES DAILY WITH MEALS AND 1 CAPSULE WITH AT least TWO snacks   rosuvastatin (CRESTOR) 10 MG tablet TAKE 1 TABLET BY MOUTH AT BEDTIME   traZODone (DESYREL) 100 MG tablet Take 4 tablets (400 mg total) by mouth at bedtime.   valsartan (DIOVAN) 80 MG tablet Take 1 tablet (80 mg total) by mouth daily.   [DISCONTINUED] Continuous Glucose Sensor (DEXCOM G7 SENSOR) MISC USE AS DIRECTED AND CHANGE EVERY 10 DAYS   [DISCONTINUED] insulin glargine (LANTUS SOLOSTAR) 100 UNIT/ML Solostar Pen Inject 70 Units into the skin at bedtime. (Patient taking differently: Inject 80 Units into the skin at bedtime.)   [DISCONTINUED] insulin lispro (HUMALOG) 100 UNIT/ML KwikPen Inject 10-16 Units into the skin 3 (three)  times daily. (Patient taking differently: Inject 14-19 Units into the skin 3 (three) times daily before meals. Sliding scale)   [DISCONTINUED] metFORMIN (GLUCOPHAGE-XR) 500 MG 24 hr tablet Take 1 tablet (500 mg total) by mouth daily with breakfast.   Continuous Glucose Sensor (DEXCOM G7 SENSOR) MISC USE AS DIRECTED AND CHANGE EVERY 10 DAYS   insulin glargine (LANTUS SOLOSTAR) 100 UNIT/ML Solostar Pen Inject 100 Units into the skin at bedtime.   insulin lispro (HUMALOG) 100 UNIT/ML KwikPen Inject 20-26 Units into the skin 3 (three) times daily.   metFORMIN (GLUCOPHAGE-XR) 500 MG 24 hr tablet Take 1 tablet (500 mg total) by mouth daily with breakfast.   No facility-administered encounter medications on file as of 10/31/2023.    ALLERGIES: Allergies  Allergen Reactions   Codeine Hives and Swelling   Ozempic (0.25 Or 0.5 Mg-Dose) [Semaglutide(0.25 Or 0.5mg -Dos)]     pancreatitis   Morphine And Codeine Swelling and Rash    VACCINATION STATUS: Immunization History  Administered Date(s) Administered   Influenza Inj Mdck Quad Pf 04/26/2019   Moderna Sars-Covid-2 Vaccination 11/13/2019, 12/17/2019    Diabetes She presents for her follow-up diabetic visit. She has type 2 diabetes mellitus. Onset time: diagnosed at approx age of 80. Her disease course has been stable. There are no hypoglycemic associated symptoms. Associated symptoms include fatigue, foot paresthesias, polydipsia, polyuria and weight loss. There are no hypoglycemic complications. Diabetic complications include peripheral neuropathy. (Hx pancreatitis) Risk factors for coronary artery disease include diabetes mellitus, dyslipidemia, family history, obesity, hypertension and sedentary lifestyle. Current diabetic treatment includes intensive insulin program and oral agent (monotherapy). She is compliant with treatment most of the time. Her weight is decreasing steadily. She is following a generally unhealthy diet. Meal planning includes  avoidance of concentrated sweets and ADA exchanges. She has not had a previous visit with a dietitian. She participates in exercise intermittently. Her home  blood glucose trend is fluctuating minimally. Her breakfast blood glucose range is generally >200 mg/dl. Her lunch blood glucose range is generally >200 mg/dl. Her dinner blood glucose range is generally >200 mg/dl. Her bedtime blood glucose range is generally >200 mg/dl. Her overall blood glucose range is >200 mg/dl. (She presents today with her CGM showing gross hyperglycemia overall.  Her POCT A1c today is 11.7%, improving slightly from last visit of 12%.  She denies any hypoglycemia.  Analysis of her CGM shows TIR 3%, TAR 96%, TBR 1% with a GMI of 11.3%.  She recently had cardiac cath.  She is not wearing a CGM right now, getting a refill soon.) An ACE inhibitor/angiotensin II receptor blocker is being taken. She does not see a podiatrist.Eye exam is current.    Review of systems  Constitutional: + decreasing body weight,  current Body mass index is 37.34 kg/m. , no fatigue, no subjective hyperthermia, no subjective hypothermia Eyes: no blurry vision, no xerophthalmia ENT: no sore throat, no nodules palpated in throat, no dysphagia/odynophagia, no hoarseness Cardiovascular: no chest pain, no shortness of breath, no palpitations, no leg swelling Respiratory: no cough, no shortness of breath Gastrointestinal: no nausea/vomiting/diarrhea Musculoskeletal: no muscle/joint aches Skin: no rashes, no hyperemia Neurological: no tremors, no numbness, no tingling, no dizziness Psychiatric: no depression, no anxiety  Objective:     BP 100/70 (BP Location: Right Arm, Patient Position: Sitting, Cuff Size: Large)   Pulse 76   Ht 5\' 3"  (1.6 m)   Wt 210 lb 12.8 oz (95.6 kg)   LMP  (LMP Unknown)   BMI 37.34 kg/m   Wt Readings from Last 3 Encounters:  10/31/23 210 lb 12.8 oz (95.6 kg)  10/27/23 227 lb 3.2 oz (103.1 kg)  10/03/23 223 lb (101.2 kg)      BP Readings from Last 3 Encounters:  10/31/23 100/70  10/27/23 100/63  10/03/23 114/70     Physical Exam- Limited  Constitutional:  Body mass index is 37.34 kg/m. , not in acute distress, normal state of mind Eyes:  EOMI, no exophthalmos Musculoskeletal: no gross deformities, strength intact in all four extremities, no gross restriction of joint movements Skin:  no rashes, no hyperemia Neurological: no tremor with outstretched hands   Diabetic Foot Exam - Simple   Simple Foot Form Diabetic Foot exam was performed with the following findings: Yes 10/31/2023  3:26 PM  Visual Inspection No deformities, no ulcerations, no other skin breakdown bilaterally: Yes Sensation Testing Intact to touch and monofilament testing bilaterally: Yes Pulse Check Posterior Tibialis and Dorsalis pulse intact bilaterally: Yes See comments: Yes Comments Weak pulses bilaterally, feet cool to touch, cap refill sluggish.     CMP ( most recent) CMP     Component Value Date/Time   NA 134 (L) 09/19/2023 1118   NA 138 09/16/2011 1753   K 3.9 09/19/2023 1118   K 3.9 09/16/2011 1753   CL 97 (L) 09/19/2023 1118   CL 103 09/16/2011 1753   CO2 26 09/19/2023 1118   CO2 23 09/16/2011 1753   GLUCOSE 388 (H) 09/19/2023 1118   GLUCOSE 93 09/16/2011 1753   BUN 24 (H) 09/19/2023 1118   BUN 14 09/16/2011 1753   CREATININE 0.72 09/19/2023 1118   CREATININE 1.17 (H) 06/03/2022 0812   CALCIUM 9.6 09/19/2023 1118   CALCIUM 9.1 09/16/2011 1753   PROT 6.8 09/19/2023 1118   PROT 7.5 09/16/2011 1753   ALBUMIN 3.7 09/19/2023 1118   ALBUMIN 4.1 09/16/2011 1753  AST 22 09/19/2023 1118   AST 17 09/16/2011 1753   ALT 30 09/19/2023 1118   ALT 23 09/16/2011 1753   ALKPHOS 75 09/19/2023 1118   ALKPHOS 56 09/16/2011 1753   BILITOT 0.6 09/19/2023 1118   BILITOT 0.2 09/16/2011 1753   GFRNONAA >60 09/19/2023 1118   GFRNONAA 79 12/11/2022 1305     Diabetic Labs (most recent): Lab Results  Component Value  Date   HGBA1C 11.7 (A) 10/31/2023   HGBA1C 12.0 (A) 07/29/2023   HGBA1C 10.1 (H) 01/26/2023   MICROALBUR 80mg /L 10/31/2023     Lipid Panel ( most recent) Lipid Panel     Component Value Date/Time   CHOL 249 (H) 01/27/2023 0851   TRIG 779 (H) 01/29/2023 0448   HDL 24 (L) 01/27/2023 0851   CHOLHDL 10.4 01/27/2023 0851   VLDL UNABLE TO CALCULATE IF TRIGLYCERIDE OVER 400 mg/dL 82/95/6213 0865   LDLCALC UNABLE TO CALCULATE IF TRIGLYCERIDE OVER 400 mg/dL 78/46/9629 5284   LDLCALC  06/03/2022 0812     Comment:     . LDL cholesterol not calculated. Triglyceride levels greater than 400 mg/dL invalidate calculated LDL results. . Reference range: <100 . Desirable range <100 mg/dL for primary prevention;   <70 mg/dL for patients with CHD or diabetic patients  with > or = 2 CHD risk factors. [name] Kitchen LDL-C is now calculated using the Martin-Hopkins  calculation, which is a validated novel method providing  better accuracy than the Friedewald equation in the  estimation of LDL-C.  Horald Pollen et al. Lenox Ahr. 1324;401(02): 2061-2068  (http://education.[facility].com/faq/FAQ164)    LDLDIRECT 39 01/27/2023 0851      Lab Results  Component Value Date   TSH 4.080 05/19/2018   TSH 2.01 09/16/2011           Assessment & Plan:   1) Type 2 diabetes mellitus with hyperglycemia, with long-term current use of insulin (HCC)  She presents today with her CGM showing gross hyperglycemia overall.  Her POCT A1c today is 11.7%, improving slightly from last visit of 12%.  She denies any hypoglycemia.  Analysis of her CGM shows TIR 3%, TAR 96%, TBR 1% with a GMI of 11.3%.  She recently had cardiac cath.  She is not wearing a CGM right now, getting a refill soon.  She admits to eating too much fried food lately.  - HAYLEEN CLINKSCALES has currently uncontrolled symptomatic type 2 DM since 53 years of age.   -Recent labs reviewed.  - I had a long discussion with her about the progressive nature of  diabetes and the pathology behind its complications. -her diabetes is complicated by neuropathy and chronic pancreatitis and she remains at a high risk for more acute and chronic complications which include CAD, CVA, CKD, retinopathy, and neuropathy. These are all discussed in detail with her.  The following Lifestyle Medicine recommendations according to American College of Lifestyle Medicine [facility]) were discussed and offered to patient and she agrees to start the journey:  A. Whole Foods, Plant-based plate comprising of fruits and vegetables, plant-based proteins, whole-grain carbohydrates was discussed in detail with the patient.   A list for source of those nutrients were also provided to the patient.  Patient will use only water or unsweetened tea for hydration. B.  The need to stay away from risky substances including alcohol, smoking; obtaining 7 to 9 hours of restorative sleep, at least 150 minutes of moderate intensity exercise weekly, the importance of healthy social connections,  and stress reduction techniques  were discussed. C.  A full color page of  Calorie density of various food groups per pound showing examples of each food groups was provided to the patient.  - Nutritional counseling repeated at each appointment due to patients tendency to fall back in to old habits.  - The patient admits there is a room for improvement in their diet and drink choices. -  Suggestion is made for the patient to avoid simple carbohydrates from their diet including Cakes, Sweet Desserts / Pastries, Ice Cream, Soda (diet and regular), Sweet Tea, Candies, Chips, Cookies, Sweet Pastries, Store Bought Juices, Alcohol in Excess of 1-2 drinks a day, Artificial Sweeteners, Coffee Creamer, and "Sugar-free" Products. This will help patient to have stable blood glucose profile and potentially avoid unintended weight gain.   - I encouraged the patient to switch to unprocessed or minimally processed complex starch and  increased protein intake (animal or plant source), fruits, and vegetables.   - Patient is advised to stick to a routine mealtimes to eat 3 meals a day and avoid unnecessary snacks (to snack only to correct hypoglycemia).  - she has already seen Norm Salt, RDN, CDE for diabetes education.  - I have approached her with the following individualized plan to manage her diabetes and patient agrees:   -She is advised to increase her Lantus to 100 units SQ nightly and adjust her Humalog to 20-26 units TID with meals if glucose is above 90 and she is eating (Specific instructions on how to titrate insulin dosage based on glucose readings given to patient in writing).  She can continue her Metformin 500 mg ER po daily for now.  -she is encouraged to continue monitoring glucose 4 times daily (using her CGM), before meals and before bed (using her CGM), and to call the clinic if she has readings less than 70 or above 300 for 3 tests in a row.   - she is warned not to take insulin without proper monitoring per orders. - Adjustment parameters are given to her for hypo and hyperglycemia in writing.  - her Marcelline Deist was previously discontinued, risk outweighs benefit for this patient- hx of UTI and yeast infections, and yeast on groin.  - she is NOT a good candidate for GLP1 therapy given hx of chronic pancreatitis and significantly elevated triglycerides.   - Specific targets for  A1c; LDL, HDL, and Triglycerides were discussed with the patient.  2) Blood Pressure /Hypertension:  her blood pressure is controlled to target.   she is advised to continue her current medications including Lisinopril 5 mg p.o. daily with breakfast.  3) Lipids/Hyperlipidemia:    Review of her recent lipid panel from 02/18/23 showed significantly elevated triglycerides of 779, LDL unable to be calculated.  she is advised to continue Crestor 20 mg daily at bedtime.  Side effects and precautions discussed with her.  She has appt  with PCP tomorrow and recently had fasting labs, will request copy.  4)  Weight/Diet:  her Body mass index is 37.34 kg/m.  -  clearly complicating her diabetes care.   she is a candidate for weight loss. I discussed with her the fact that loss of 5 - 10% of her  current body weight will have the most impact on her diabetes management.  Exercise, and detailed carbohydrates information provided  -  detailed on discharge instructions.  5) Chronic Care/Health Maintenance: -she is on ACEI/ARB and Statin medications and is encouraged to initiate and continue to follow up with Ophthalmology,  Dentist, Podiatrist at least yearly or according to recommendations, and advised to stay away from smoking. I have recommended yearly flu vaccine and pneumonia vaccine at least every 5 years; moderate intensity exercise for up to 150 minutes weekly; and sleep for at least 7 hours a day.  - she is advised to maintain close follow up with Encarnacion Slates, PA-C for primary care needs, as well as her other providers for optimal and coordinated care.     I spent  44  minutes in the care of the patient today including review of labs from CMP, Lipids, Thyroid Function, Hematology (current and previous including abstractions from other facilities); face-to-face time discussing  her blood glucose readings/logs, discussing hypoglycemia and hyperglycemia episodes and symptoms, medications doses, her options of short and long term treatment based on the latest standards of care / guidelines;  discussion about incorporating lifestyle medicine;  and documenting the encounter. Risk reduction counseling performed per USPSTF guidelines to reduce obesity and cardiovascular risk factors.     Please refer to Patient Instructions for Blood Glucose Monitoring and Insulin/Medications Dosing Guide"  in media tab for additional information. Please  also refer to " Patient Self Inventory" in the Media  tab for reviewed elements of pertinent patient  history.  [name] participated in the discussions, expressed understanding, and voiced agreement with the above plans.  All questions were answered to her satisfaction. she is encouraged to contact clinic should she have any questions or concerns prior to her return visit.     Follow up plan: - Return in about 4 months (around 03/01/2024) for Diabetes F/U with A1c in office, No previsit labs, Bring meter and logs.  Ronny Bacon, [facility] [address] [place], [place] [address] Phone: [phone] Fax: [phone]  10/31/2023, 3:28 PM

## 2023-11-01 ENCOUNTER — Ambulatory Visit: Payer: Medicare Other | Admitting: Nurse Practitioner

## 2023-11-01 DIAGNOSIS — K219 Gastro-esophageal reflux disease without esophagitis: Secondary | ICD-10-CM | POA: Diagnosis not present

## 2023-11-01 DIAGNOSIS — M545 Low back pain, unspecified: Secondary | ICD-10-CM | POA: Diagnosis not present

## 2023-11-01 DIAGNOSIS — Z0000 Encounter for general adult medical examination without abnormal findings: Secondary | ICD-10-CM | POA: Diagnosis not present

## 2023-11-01 DIAGNOSIS — L309 Dermatitis, unspecified: Secondary | ICD-10-CM | POA: Diagnosis not present

## 2023-11-02 ENCOUNTER — Ambulatory Visit (HOSPITAL_COMMUNITY)

## 2023-11-04 ENCOUNTER — Ambulatory Visit: Attending: Nurse Practitioner | Admitting: Nurse Practitioner

## 2023-11-04 ENCOUNTER — Encounter: Payer: Self-pay | Admitting: Nurse Practitioner

## 2023-11-04 VITALS — BP 122/70 | HR 68 | Ht 63.0 in | Wt 227.4 lb

## 2023-11-04 DIAGNOSIS — R079 Chest pain, unspecified: Secondary | ICD-10-CM

## 2023-11-04 DIAGNOSIS — I10 Essential (primary) hypertension: Secondary | ICD-10-CM | POA: Diagnosis not present

## 2023-11-04 DIAGNOSIS — G4733 Obstructive sleep apnea (adult) (pediatric): Secondary | ICD-10-CM

## 2023-11-04 DIAGNOSIS — E781 Pure hyperglyceridemia: Secondary | ICD-10-CM | POA: Diagnosis not present

## 2023-11-04 DIAGNOSIS — E785 Hyperlipidemia, unspecified: Secondary | ICD-10-CM

## 2023-11-04 DIAGNOSIS — I25119 Atherosclerotic heart disease of native coronary artery with unspecified angina pectoris: Secondary | ICD-10-CM | POA: Diagnosis not present

## 2023-11-04 DIAGNOSIS — E669 Obesity, unspecified: Secondary | ICD-10-CM

## 2023-11-04 MED ORDER — RANOLAZINE ER 500 MG PO TB12: 500.0000 mg | ORAL_TABLET | Freq: Two times a day (BID) | ORAL | 1 refills | Status: DC

## 2023-11-04 NOTE — Progress Notes (Signed)
 Cardiology Office Note:  .   Date: 11/04/2023 ID:  Jamie [name], DOB 16-Jan-1971, MRN [id number] PCP: Lova Rua  [facility] Providers Cardiologist:  Jamie Pointer, MD    History of Present Illness: Jamie [name]   Jamie [name] is a 53 y.o. female with a PMH of chest pain, HTN, HLD, T2DM, DOE, hypertriglyceridemia, hx of pancreatitis, hepatic steatosis, and morbid obesity, and OSA on CPAP, who presents today for follow-up.   TTE and NST in 2021 were unremarkable.   Last seen by Dr. Mallipeddi on 08/09/2023. She noted exertional, ongoing chest tightness for 6 months to 1 year, around 2 times per month, lasting minutes and resolving with rest.   CCTA 08/2023 revealed coronary calcium score of 320.  CT FFR analysis showed significant focal lesion along mid LAD (FFR 0.74). Based on this result, Dr. Mallipeddi recommended close follow-up in outpatient clinic to re-evaluate symptoms and set up for [facility].   09/27/2023 - Today she presents for follow-up. She states she has been getting over strep throat recently, just finished her antibiotic for this. Continues to note exertional, stable ongoing chest tightness. Denies any shortness of breath, palpitations, syncope, presyncope, dizziness, orthopnea, PND, swelling or significant weight changes, acute bleeding, or claudication.   Underwent left heart cath on 10/03/2023 - see report below.   Today she presents for follow-up. She says her CP has improved a lot since I last saw her, but continues to note symptoms. Denies any shortness of breath, palpitations, syncope, presyncope, dizziness, orthopnea, PND, swelling or significant weight changes, acute bleeding, or claudication.  ROS: Negative. See HPI.  SH: Her husband is also a patient of mine.   Studies Reviewed: Jamie [name]    EKG:  EKG Interpretation Date/Time:  Friday November 04 2023 09:33:20 EDT Ventricular Rate:  81 PR Interval:  182 QRS Duration:  84 QT Interval:  394 QTC  Calculation: 457 R Axis:   79  Text Interpretation: Normal sinus rhythm Normal ECG When compared with ECG of 27-Sep-2023 14:16, No significant change was found Confirmed by Jamie [name] [phone]) on 11/06/2023 9:03:36 PM   Left heart cath 09/2023:   Mid LAD lesion is 30% stenosed.   There is mild left ventricular systolic dysfunction.   Recommend Aspirin 81mg  daily for moderate CAD.   Left Heart Catheterization 10/03/23: Hemodynamic data: LVEDP mildly elevated at 18 mmHg.  There is no pressure gradient across the aortic valve.   Angiographic data: LM: Large-caliber vessel.  It is smooth and normal. LAD: Gives origin to a very large proximal D1 and a moderate-sized D2, at the origin of D2 there is a focal calcific 30% stenosis.  Otherwise the LAD is smooth and normal. LCx: Is a moderate caliber vessel, smooth and normal.  There is no significant marginal branches. RCA: Large-caliber vessel, smooth and normal, gives origin to a large PDA and large PL branch.         Impression and recommendations: Probably intermediate stenosis in the mid LAD that can be treated medically, she is only on single antianginal agent, consider adding Ranexa for microvascular disease as well.  If she has recurrence of chest discomfort then will consider IVUS guided PCI to the LAD however after review of her coronary anatomy and also coronary CT FFR, I feel medical therapy is probably most optimal.  Please see images above.  CCTA 08/2023:  IMPRESSION: 1. Moderate stenosis, CADRADS = 3. CT FFR will be performed and reported separately.   2. Coronary  calcium score of 320. This was 99th percentile for age-, sex-, and race- matched controls.   3. Total plaque volume 305 mm3 which is 86th percentile for age- and sex- matched controls (calcified plaque 85 mm3; noncalcified plaque 220 mm3). Total plaque volume is mild/moderate/severe/extensive.   4. Normal coronary origin with right dominance.    INTERPRETATION:   CAD-RADS 3: Moderate stenosis (50-69%). Consider symptom-guided anti-ischemic pharmacotherapy as well as risk factor modification per guideline directed care. Additional analysis with CT FFR will be submitted.  IMPRESSION: 1. CT FFR analysis shows significant focal stenosis in mid LAD (FFR 0.74).  IMPRESSION: 1. No acute or unexpected findings in the chest. 2. Hepatic steatosis.  Echo 02/2023:  1. Left ventricular ejection fraction, by estimation, is 60 to 65%. The  left ventricle has normal function. The left ventricle has no regional  wall motion abnormalities. Left ventricular diastolic parameters were  normal.   2. Right ventricular systolic function is normal. The right ventricular  size is normal.   3. The mitral valve is grossly normal. No evidence of mitral valve  regurgitation. No evidence of mitral stenosis.   4. The aortic valve was not well visualized. Aortic valve regurgitation  is not visualized. No aortic stenosis is present.   Comparison(s): No significant change from prior study.  Lexiscan 03/2020:  The left ventricular ejection fraction is normal (55-65%). Nuclear stress EF: 59%. There was no ST segment deviation noted during stress. The study is normal. This is a low risk study.   Risk Assessment/Calculations:       The 10-year ASCVD risk score (Arnett DK, et al., 2019) is: 11%   Values used to calculate the score:     Age: 3 years     Sex: Female     Is Non-Hispanic African American: No     Diabetic: Yes     Tobacco smoker: No     Systolic Blood Pressure: 122 mmHg     Is BP treated: Yes     HDL Cholesterol: 24 mg/dL     Total Cholesterol: 249 mg/dL  Physical Exam:   VS:  BP 122/70   Pulse 68   Ht 5\' 3"  (1.6 m)   Wt 227 lb 6.4 oz (103.1 kg)   LMP  (LMP Unknown)   SpO2 98%   BMI 40.28 kg/m    Wt Readings from Last 3 Encounters:  11/04/23 227 lb 6.4 oz (103.1 kg)  10/31/23 210 lb 12.8 oz (95.6 kg)  10/27/23 227 lb 3.2  oz (103.1 kg)    GEN: Morbidly obese, 53 y.o. female in no acute distress NECK: No JVD; No carotid bruits CARDIAC: S1/S2, RRR, no murmurs, rubs, gallops RESPIRATORY:  Clear to auscultation without rales, wheezing or rhonchi  ABDOMEN: Soft, non-tender, non-distended EXTREMITIES:  No edema; No deformity   ASSESSMENT AND PLAN: .    Exertional chest pain, CAD Notes improved CP, past CCTA showed moderate stenosis along mLAD, FFR 0.74. LHC 09/2023 showed moderate CAD, recommended medical management. If she was noted to have recurrent chest discomfort, was recommended to consider IVUS guided PCI to LAD. EKG is reassuring today. Will start Ranexa 500 mg BID to help improve her symptoms. Will repeat EKG in 1 week for further evaluation.  Continue rest of medication regimen. Heart healthy diet and regular cardiovascular exercise encouraged. Care and ED precautions discussed. At next visit, will recheck CBC, she defers bloodwork at this time.   2. HTN BP stable. Discussed to monitor BP at home  at least 2 hours after medications and sitting for 5-10 minutes. Continue current medication regimen. Heart healthy diet and regular cardiovascular exercise encouraged.   3. HLD, hypertriglyceridemia Recent LDL was unable to be calculated d/t severely elevated TG level. Continue current medication regimen. Heart healthy diet and regular cardiovascular exercise encouraged. Continue to follow with PCP. Not addressed, but at next visit, consider referral to lipid clinic.   4. Obesity Weight loss via diet and exercise encouraged. Discussed the impact being overweight would have on cardiovascular risk.  5. OSA on CPAP Encouraged continued compliance.   Dispo: Follow-up with me/[facility] in 3 months or sooner if anything changes.   Signed, Jamie Pointer, NP

## 2023-11-04 NOTE — Patient Instructions (Addendum)
 Medication Instructions:  Your physician has recommended you make the following change in your medication:  Please start Ranexa 500 Mg Twice daily   Labwork: None   Testing/Procedures: None   Follow-Up: Your physician recommends that you schedule a follow-up appointment in:  1 Week nurse visit for EKG 3 Months with your provider   Any Other Special Instructions Will Be Listed Below (If Applicable).  If you need a refill on your cardiac medications before your next appointment, please call your pharmacy.

## 2023-11-08 ENCOUNTER — Ambulatory Visit (HOSPITAL_COMMUNITY)
Admission: RE | Admit: 2023-11-08 | Discharge: 2023-11-08 | Disposition: A | Discharge status: Home / Self Care | Source: Ambulatory Visit | Attending: Gastroenterology | Admitting: Gastroenterology

## 2023-11-08 DIAGNOSIS — R1032 Left lower quadrant pain: Secondary | ICD-10-CM | POA: Insufficient documentation

## 2023-11-08 DIAGNOSIS — K760 Fatty (change of) liver, not elsewhere classified: Secondary | ICD-10-CM | POA: Diagnosis not present

## 2023-11-08 DIAGNOSIS — N281 Cyst of kidney, acquired: Secondary | ICD-10-CM | POA: Diagnosis not present

## 2023-11-08 DIAGNOSIS — K5730 Diverticulosis of large intestine without perforation or abscess without bleeding: Secondary | ICD-10-CM | POA: Diagnosis not present

## 2023-11-08 LAB — POCT I-STAT CREATININE: Creatinine, Ser: 1.1 mg/dL — ABNORMAL HIGH (ref 0.44–1.00)

## 2023-11-08 MED ORDER — IOHEXOL 350 MG/ML SOLN
100.0000 mL | Freq: Once | INTRAVENOUS | Status: AC | PRN
  Administered 2023-11-08: 100 mL via INTRAVENOUS

## 2023-11-11 ENCOUNTER — Ambulatory Visit: Attending: Cardiology

## 2023-11-11 VITALS — BP 112/80 | HR 85 | Ht 63.0 in | Wt 227.6 lb

## 2023-11-11 DIAGNOSIS — R079 Chest pain, unspecified: Secondary | ICD-10-CM | POA: Diagnosis not present

## 2023-11-11 NOTE — Progress Notes (Signed)
 Patient was started on Ranexa  500 mg BID by provider. Patient stated since starting medication she feels better. Pt stated that she still has chest pain, but is nothing like before. Patient stated that she can tell a difference since starting medication. Patient denies SOB, Nausea, Vomiting, etc.   EKG performed. Sent to provider to review.

## 2023-11-11 NOTE — Progress Notes (Signed)
 Patient notified of plan and voiced understanding. Patient had no questions or concerns at this time.

## 2023-11-11 NOTE — Patient Instructions (Addendum)
 Medication Instructions:  Your physician recommends that you continue on your current medications as directed. Please refer to the Current Medication list given to you today.  *If you need a refill on your cardiac medications before your next appointmen

## 2023-11-11 NOTE — Progress Notes (Signed)
 Lasalle Pointer, NP  Casper Clement, CMA Thanks for seeing her. I looked at her EKG and it looks great! She is in normal sinus rhythm. No changes.  Follow-up as scheduled.  Best, Lasalle Pointer, NP

## 2023-12-05 ENCOUNTER — Other Ambulatory Visit: Payer: Self-pay | Admitting: Internal Medicine

## 2023-12-22 DIAGNOSIS — Z1231 Encounter for screening mammogram for malignant neoplasm of breast: Secondary | ICD-10-CM | POA: Diagnosis not present

## 2024-01-02 ENCOUNTER — Telehealth: Payer: Self-pay | Admitting: *Deleted

## 2024-01-02 NOTE — Telephone Encounter (Signed)
 Patient was called and made aware.

## 2024-01-02 NOTE — Telephone Encounter (Signed)
 Patient left a message . She states that her blood sugars have been running in the 300's and in the 400's. She does not know what to do. Patient was called, She verified that she is taking Metformin  500 mg daily with her breakfast, she is injecting the Humalog  26 units three times daily before her meals. Her sliding scale is 20-26 units, She is also injecting 100 units of Lantus  at bedtime.  Patient states that she is rotating her sites when injecting her insulins, she is not seeing any insulins leaking from the injection site, she has not been on any antibiotics or prednisone .  She states that she is eating 3 meals a day , she does drink an occasional diet drink but shares she primarily drinks water .  She just got a new phone over the weekend and set it up with her Dexcom, the CGM printout is not showing the last 4 days, she said that this was how long she was without a phone.  Sending to Dr. Monte Antonio to review and advise.

## 2024-01-03 ENCOUNTER — Ambulatory Visit (HOSPITAL_BASED_OUTPATIENT_CLINIC_OR_DEPARTMENT_OTHER): Admitting: Psychiatry

## 2024-01-03 ENCOUNTER — Telehealth (HOSPITAL_COMMUNITY): Payer: Self-pay | Admitting: Psychiatry

## 2024-01-03 VITALS — BP 128/86 | HR 99 | Ht 63.0 in | Wt 228.2 lb

## 2024-01-03 DIAGNOSIS — F251 Schizoaffective disorder, depressive type: Secondary | ICD-10-CM | POA: Diagnosis not present

## 2024-01-03 DIAGNOSIS — F323 Major depressive disorder, single episode, severe with psychotic features: Secondary | ICD-10-CM

## 2024-01-03 MED ORDER — AUVELITY 45-105 MG PO TBCR: EXTENDED_RELEASE_TABLET | ORAL | 3 refills | Status: DC

## 2024-01-03 MED ORDER — CLONAZEPAM 1 MG PO TABS: ORAL_TABLET | ORAL | 4 refills | Status: DC

## 2024-01-03 MED ORDER — TRAZODONE HCL 100 MG PO TABS: 400.0000 mg | ORAL_TABLET | Freq: Every day | ORAL | 1 refills | Status: DC

## 2024-01-03 MED ORDER — DULOXETINE HCL 60 MG PO CPEP
120.0000 mg | ORAL_CAPSULE | Freq: Every day | ORAL | 4 refills | Status: DC
Start: 2024-01-03 — End: 2024-04-18

## 2024-01-03 MED ORDER — ARIPIPRAZOLE 20 MG PO TABS: 20.0000 mg | ORAL_TABLET | Freq: Every day | ORAL | 1 refills | Status: DC

## 2024-01-03 NOTE — Progress Notes (Signed)
 Patient ID: Jamie [name], female   DOB: [birth year], 53 y.o.   MRN: [id number] [facility] MD Progress Note  01/03/2024 3:36 PM Jamie [name]  MRN:  [id number] Subjective:  Feeling well. Principal Problem: Major depression recurrent   Today the patient is seen in the office and is not doing well.  She describes persistent daily depression for the last 5 to 6 weeks.  We will later turn out that after her father-in-law died and around that time.  The patient was interviewed first by herself and then we brought in her husband Hewitt Lou from the parking lot.  Patient's mood has been down and now persistently and it seems to be getting worse.  She actually is sleeping and eating well.  Her energy level is low.  She denies sense of worthlessness.  She has been thinking about suicide on a relatively regular basis.  It is not fleeting but rather she dwells on it.  She does not have any specific plans of how she is doing and she says she has no true intent to act.  Her husband is very well aware of her thoughts and keeps the gun with him all the time.  The patient's husband and the patient's mother watch the patient regularly about taking her medicines on time.  She does not have access to her medicines.  The patient does describe anhedonia.  A month or 2 ago she did enjoy reading and watching TV and time with her dog.  Now she enjoys none of those things.  Today we talked about the possibility of coming into the hospital.  She is resistant to that idea and shows an option which I approve of.  She will begin either in the partial hospitalization or the IOP program next week.  Today we will also taper off of Wellbutrin  in 2 days after she is off and she will begin 1 Auvelity.  By the time she starts from his new agent showed been off of Wellbutrin  at least 72 hours.  The patient has never had a seizure before.  The patient will continue taking Cymbalta  as well as her Klonopin .  The patient agrees with this plan to start  into an outpatient program next week.  We will call her again this week.  The patient and her husband have agreed to call us  if anything changes.  Her husband is very aware of her status. Virtual Visit via Telephone Note  I connected with Jamie [name] on 05/13/2021 at  2:00 PM EDT by telephone and verified that I am speaking with the correct person using two identifiers.  Location: Patient: home Provider: office   I discussed the limitations, risks, security and privacy concerns of performing an evaluation and management service by telephone and the availability of in person appointments. I also discussed with the patient that there may be a patient responsible charge related to this service. The patient expressed understanding and agreed to proceed.    I discussed the assessment and treatment plan with the patient. The patient was provided an opportunity to ask questions and all were answered. The patient agreed with the plan and demonstrated an understanding of the instructions.   The patient was advised to call back or seek an in-person evaluation if the symptoms worsen or if the condition fails to improve as anticipated.  I provided 30 minutes of non-face-to-face time during this encounter.   Jamie Fey, MD   Past Medical History:  Diagnosis Date  Anxiety    Arthritis    knees   Bipolar 1 disorder (HCC)    Chronic diarrhea    Diverticulosis of colon    GERD (gastroesophageal reflux disease)    Hiatal hernia    History of concussion    per pt 11/ 2020 w/ no residual   Hyperlipidemia    Hypertension    followed by pcp   (11-29-2019  per pt stated never had stress test)   MDD (major depressive disorder)    Migraine    neurologist--- dr Gracie Lav   OSA on CPAP    followed by dr dohmeier   PCOS (polycystic ovarian syndrome)    PONV (postoperative nausea and vomiting)    Type 2 diabetes mellitus (HCC)    followed by pcp   (11-29-2019  pt stated checks blood sugar daily  in am,  fasting sugar--- 96    Past Surgical History:  Procedure Laterality Date   ANAL FISSURE REPAIR  x3  last one  2007  approx.   BIOPSY  09/06/2019   Procedure: BIOPSY;  Surgeon: Suzette Espy, MD;  Location: [facility];  Service: Endoscopy;;   COLONOSCOPY WITH PROPOFOL  [name] 09/06/2019   pancolonic diverticulosis. Segmental biopsy completed with benign biopsies.    ESOPHAGOGASTRODUODENOSCOPY (EGD) WITH PROPOFOL  [name] 06/16/2020   Dr. Riley Cheadle: normal esophagus s/p dilation, normal stomach, duodenum.    ESOPHAGOGASTRODUODENOSCOPY (EGD) WITH PROPOFOL  [name] 02/10/2022   Procedure: ESOPHAGOGASTRODUODENOSCOPY (EGD) WITH PROPOFOL ;  Surgeon: Suzette Espy, MD;  Location: [facility];  Service: Endoscopy;  Laterality: [name];  9:45am   EVALUATION UNDER ANESTHESIA WITH HEMORRHOIDECTOMY [name] 12/05/2019   Procedure: ANORECTAL EXAM UNDER ANESTHESIA WITH HEMORRHOIDECTOMY, HEMORRHOIDAL LIGATION/PEXY;  Surgeon: Candyce Champagne, MD;  Location: [facility];  Service: General;  Laterality: [name];  GENERAL AND LOCAL   EVALUATION UNDER ANESTHESIA WITH TEAR DUCT PROBING  infant   KNEE ARTHROSCOPY Right 1994   LAPAROSCOPIC CHOLECYSTECTOMY  08-06-2002  @[facility]    LEFT HEART CATH AND CORONARY ANGIOGRAPHY [name] 10/03/2023   Procedure: LEFT HEART CATH AND CORONARY ANGIOGRAPHY;  Surgeon: Knox Perl, MD;  Location: [facility];  Service: Cardiovascular;  Laterality: [name];   LUMBAR LAMINECTOMY/DECOMPRESSION MICRODISCECTOMY Right 01/22/2014   Procedure: LUMBAR FOUR TO FIVE LUMBAR LAMINECTOMY/DECOMPRESSION MICRODISCECTOMY 1 LEVEL;  Surgeon: Adelbert Adler, MD;  Location: [facility];  Service: Neurosurgery;  Laterality: Right;  Right L45 diskectomy   MALONEY DILATION [name] 06/16/2020   Procedure: Londa Rival DILATION;  Surgeon: Suzette Espy, MD;  Location: [facility];  Service: Endoscopy;  Laterality: [name];   MALONEY DILATION [name] 02/10/2022   Procedure: Londa Rival DILATION;  Surgeon: Suzette Espy, MD;  Location: [facility];  Service: Endoscopy;  Laterality: [name];   Family History:  Family History  Problem Relation Age of Onset   COPD Father    Anxiety disorder Maternal Grandmother    Depression Maternal Grandmother    Colon cancer Neg Hx    Colon polyps Neg Hx    Family Psychiatric  History:  Social History:  Social History   Substance and Sexual Activity  Alcohol  Use No     Social History   Substance and Sexual Activity  Drug Use Never    Social History   Socioeconomic History   Marital status: Married    Spouse name: Not on file   Number of children: 0   Years of education: college   Highest education level: Not on file  Occupational History   Occupation:  disabled  Tobacco Use   Smoking status: Never   Smokeless tobacco: Never  Vaping Use   Vaping status: Never Used  Substance and Sexual Activity   Alcohol  use: No   Drug use: Never   Sexual activity: Yes    Partners: Male    Birth control/protection: None  Other Topics Concern   Not on file  Social History Narrative   Patient lives at home alone    Patient is right handed   Patient drinks soda's daily   Social Drivers of Corporate investment banker Strain: Not on file  Food Insecurity: No Food Insecurity (01/27/2023)   Hunger Vital Sign    Worried About Running Out of Food in the Last Year: Never true    Ran Out of Food in the Last Year: Never true  Transportation Needs: No Transportation Needs (01/27/2023)   PRAPARE - Administrator, Civil Service (Medical): No    Lack of Transportation (Non-Medical): No  Physical Activity: Not on file  Stress: Not on file  Social Connections: Not on file   Additional Social History:                         Sleep: Fair  Appetite:  Good  Current Medications: Current Outpatient Medications  Medication Sig Dispense Refill   ACCU-CHEK GUIDE test strip      albuterol  (VENTOLIN  HFA) 108 (90 Base) MCG/ACT inhaler Inhale 2 puffs into the lungs every 4  (four) hours as needed for wheezing or shortness of breath. 1 each 3   Continuous Glucose Sensor (DEXCOM G7 SENSOR) MISC USE AS DIRECTED AND CHANGE EVERY 10 DAYS 9 each 3   Dextromethorphan-buPROPion  ER (AUVELITY) 45-105 MG TBCR 1 qam for 1 week then 1 bid 60 tablet 3   dicyclomine  (BENTYL ) 10 MG capsule Take 1 capsule (10 mg total) by mouth 2 (two) times daily as needed for spasms. 30 capsule 0   fenofibrate  micronized (LOFIBRA) 134 MG capsule Take 1 capsule (134 mg total) by mouth daily. 90 capsule 2   fluticasone  (FLONASE) 50 MCG/ACT nasal spray Place 1 spray into both nostrils at bedtime as needed for allergies or rhinitis.      furosemide  (LASIX ) 20 MG tablet TAKE 1 TABLET BY MOUTH DAILY 30 tablet 5   gabapentin  (NEURONTIN ) 600 MG tablet Take 600 mg by mouth at bedtime.     insulin  glargine (LANTUS  SOLOSTAR) 100 UNIT/ML Solostar Pen Inject 100 Units into the skin at bedtime. 90 mL 3   insulin  lispro (HUMALOG ) 100 UNIT/ML KwikPen Inject 20-26 Units into the skin 3 (three) times daily. 75 mL 3   Insulin  Pen Needle (CAREFINE PEN NEEDLES) 31G X 6 MM MISC Use to inject insulin  4 times daily 300 each 3   lidocaine  (LIDODERM ) 5 % Place 1 patch onto the skin daily. Remove & Discard patch within 12 hours or as directed by MD (Patient taking differently: Place 1 patch onto the skin daily as needed (pain). Remove & Discard patch within 12 hours or as directed by MD) 30 patch 0   metFORMIN  (GLUCOPHAGE -XR) 500 MG 24 hr tablet Take 1 tablet (500 mg total) by mouth daily with breakfast. 90 tablet 3   metoprolol  tartrate (LOPRESSOR ) 25 MG tablet TAKE 1 TABLET BY MOUTH TWICE DAILY 180 tablet 1   Multiple Vitamins-Minerals (MULTIVITAMIN WITH MINERALS) tablet Take 1 tablet by mouth daily.     nitroGLYCERIN  (NITROSTAT ) 0.4 MG SL tablet Place 1  tablet (0.4 mg total) under the tongue every 5 (five) minutes x 3 doses as needed (if no relief after 3rd dose proceed to ED or call 911). 25 tablet 2   omega-3 acid ethyl  esters (LOVAZA ) 1 g capsule Take 2 g by mouth 2 (two) times daily.     omeprazole  (PRILOSEC) 40 MG capsule Take 1 capsule (40 mg total) by mouth daily. 30 capsule 11   ondansetron  (ZOFRAN -ODT) 4 MG disintegrating tablet TAKE 1 TABLET BY MOUTH EVERY 8 HOURS AS NEEDED FOR NAUSEA AND VOMITING (Patient taking differently: No sig reported) 90 tablet 3   Pancrelipase , Lip-Prot-Amyl, (ZENPEP ) 40000-126000 units CPEP TAKE TWO CAPSULES BY MOUTH THREE TIMES DAILY WITH MEALS AND 1 CAPSULE WITH AT least TWO snacks 270 capsule 3   ranolazine  (RANEXA ) 500 MG 12 hr tablet Take 1 tablet (500 mg total) by mouth 2 (two) times daily. 180 tablet 1   rosuvastatin  (CRESTOR ) 10 MG tablet TAKE 1 TABLET BY MOUTH AT BEDTIME 90 tablet 1   valsartan  (DIOVAN ) 80 MG tablet Take 1 tablet (80 mg total) by mouth daily. 30 tablet 11   amLODipine  (NORVASC ) 5 MG tablet Take 1 tablet (5 mg total) by mouth daily. 90 tablet 0   ARIPiprazole  (ABILIFY ) 20 MG tablet Take 1 tablet (20 mg total) by mouth daily. 90 tablet 1   clonazePAM  (KLONOPIN ) 1 MG tablet Take 1 mg in the morning and 2 mg at night 90 tablet 4   DULoxetine  (CYMBALTA ) 60 MG capsule Take 2 capsules (120 mg total) by mouth at bedtime. 60 capsule 4   fluticasone  furoate-vilanterol (BREO ELLIPTA ) 100-25 MCG/ACT AEPB Inhale 1 puff into the lungs every morning. (Patient not taking: Reported on 01/03/2024) 28 each 11   NON FORMULARY Pt uses a cpap nightly     traZODone  (DESYREL ) 100 MG tablet Take 4 tablets (400 mg total) by mouth at bedtime. 360 tablet 1   No current facility-administered medications for this visit.    Lab Results: No results found for this or any previous visit (from the past 48 hours).  Physical Findings: AIMS:  , ,  ,  ,    CIWA:    COWS:     Musculoskeletal: Strength & Muscle Tone: within normal limits Gait & Station: normal Patient leans: Right  Psychiatric Specialty Exam: ROS  Blood pressure 128/86, pulse 99, height 5\' 3"  (1.6 m), weight 228 lb  3.2 oz (103.5 kg).Body mass index is 40.42 kg/m.  General Appearance: Casual  Eye Contact::  Good  Speech:  Clear and Coherent  Volume:  Normal  Mood:Depressed  Affect:  Appropriate  Today the patient is doing very well. Thought Process:  Coherent  Orientation:  Full (Time, Place, and Person)  Thought Content:  NA and WDL  Suicidal Thoughts:  yes  Homicidal Thoughts:  No  Memory:  Immediate;   NA  Judgement:  Good  Insight:  Good  Psychomotor Activity:  NA  Concentration:  Good  Recall:  Good  Fund of Knowledge:Good  Language: Good  Akathisia:  No  Handed:  Right  AIMS (if indicated):     Assets:  Desire for Improvement  ADL's:  Intact  Cognition: WNL  Sleep:      Treatment Plan Summary: 01/03/2024, 3:36 PM  This patient's diagnosis is schizoaffective disorder.  She will continue taking 20 mg of Abilify .  Today we will taper her off her Wellbutrin  but continue her Cymbalta  as it is.  The patient will begin on Auvelity  once she is completely off the Wellbutrin .  She will continue taking Cymbalta  as ordered.  She will also continue taking trazodone  and Klonopin  as prescribed.  The patient is in complete agreement together with her husband Hewitt Lou of this plan.  Hewitt Lou states that things change she will contact us  and both of them agreed to give this outpatient option a chance.  If in fact things do get worse over the next week they will contact us  and we will make efforts to hospitalize this patient.  At this time I do not think she is acutely suicidal.  I do not think she is dangerous to herself at this time.  We will likely contact her at the end of the week.

## 2024-01-03 NOTE — Telephone Encounter (Signed)
 D:  Pt referred to virtual MH-IOP per Dr. Levie Ream.  A:  Oriented pt.  Patient plans to call the case mgr next week to get CCA scheduled and start the week of January 16, 2024.  Inform Dr. Levie Ream.  R:  Pt receptive.

## 2024-01-04 ENCOUNTER — Telehealth (HOSPITAL_COMMUNITY): Payer: Self-pay | Admitting: Licensed Clinical Social Worker

## 2024-01-05 ENCOUNTER — Other Ambulatory Visit: Payer: Self-pay | Admitting: Internal Medicine

## 2024-01-10 ENCOUNTER — Other Ambulatory Visit (HOSPITAL_COMMUNITY)

## 2024-01-10 ENCOUNTER — Telehealth (HOSPITAL_COMMUNITY): Payer: Self-pay | Admitting: Professional

## 2024-01-10 NOTE — Telephone Encounter (Signed)
 See call log

## 2024-01-17 ENCOUNTER — Other Ambulatory Visit (HOSPITAL_COMMUNITY): Attending: Psychiatry | Admitting: Professional

## 2024-01-17 DIAGNOSIS — F323 Major depressive disorder, single episode, severe with psychotic features: Secondary | ICD-10-CM

## 2024-01-17 NOTE — Psych (Signed)
 Virtual Visit via Video Note  I connected with Jamie [name] on 01/17/24 at  1:00 PM EDT by a video enabled telemedicine application and verified that I am speaking with the correct person using two identifiers.  Location: Patient: Home Provider: Clinical Home Office   I discussed the limitations of evaluation and management by telemedicine and the availability of in person appointments. The patient expressed understanding and agreed to proceed.  Follow Up Instructions:    I discussed the assessment and treatment plan with the patient. The patient was provided an opportunity to ask questions and all were answered. The patient agreed with the plan and demonstrated an understanding of the instructions.   The patient was advised to call back or seek an in-person evaluation if the symptoms worsen or if the condition fails to improve as anticipated.  I provided 50 minutes of non-face-to-face time during this encounter.   Jamie [name], [facility]     Comprehensive Clinical Assessment (CCA) Note  01/17/2024 Jamie [name] [id number]  Chief Complaint:  Chief Complaint  Patient presents with   Anxiety   Depression   Visit Diagnosis: MDD    CCA Screening, Triage and Referral (STR)  Patient Reported Information How did you hear about us ? Other (Comment)  Referral name: Dr. Tasia- Psychiatrist  Referral phone number: No data recorded  Whom do you see for routine medical problems? Primary Care  Practice/Facility Name: [facility]  Practice/Facility Phone Number: No data recorded Name of Contact: No data recorded Contact Number: No data recorded Contact Fax Number: No data recorded Prescriber Name: No data recorded Prescriber Address (if known): No data recorded  What Is the Reason for Your Visit/Call Today? depression, anxiety, pSI  How Long Has This Been Causing You Problems? > than 6 months  What Do You Feel Would Help You the Most Today? Treatment for  Depression or other mood problem   Have You Recently Been in Any Inpatient Treatment (Hospital/Detox/Crisis Center/28-Day Program)? No  Name/Location of Program/Hospital:No data recorded How Long Were You There? No data recorded When Were You Discharged? No data recorded  Have You Ever Received Services From [facility] Before? No  Who Do You See at [facility]? No data recorded  Have You Recently Had Any Thoughts About Hurting Yourself? Yes  Are You Planning to Commit Suicide/Harm Yourself At This time? No   Have you Recently Had Thoughts About Hurting Someone Sherral? No  Explanation: No data recorded  Have You Used Any Alcohol  or Drugs in the Past 24 Hours? No  How Long Ago Did You Use Drugs or Alcohol ? No data recorded What Did You Use and How Much? No data recorded  Do You Currently Have a Therapist/Psychiatrist? Yes  Name of Therapist/Psychiatrist: Dr Tasia since 2012MERL [name] with [facility]   Have You Been Recently Discharged From Any Office Practice or Programs? No  Explanation of Discharge From Practice/Program: No data recorded    CCA Screening Triage Referral Assessment Type of Contact: Tele-Assessment  Is this Initial or Reassessment? Initial Assessment  Date Telepsych consult ordered in [facility]:  No data recorded Time Telepsych consult ordered in [facility]:  No data recorded  Patient Reported Information Reviewed? No data recorded Patient Left Without Being Seen? No data recorded Reason for Not Completing Assessment: No data recorded  Collateral Involvement: chart review   Does Patient Have a Court Appointed Legal Guardian? No data recorded Name and Contact of Legal Guardian: No data recorded If Minor and Not  Living with Parent(s), Who has Custody? No data recorded Is CPS involved or ever been involved? Never  Is APS involved or ever been involved? Never   Patient Determined To Be At Risk for Harm To Self or Others Based on Review of Patient  Reported Information or Presenting Complaint? No  Method: No data recorded Availability of Means: No data recorded Intent: No data recorded Notification Required: No data recorded Additional Information for Danger to Others Potential: No data recorded Additional Comments for Danger to Others Potential: No data recorded Are There Guns or Other Weapons in Your Home? Yes  Types of Guns/Weapons: This is an antique rifle with no bullets, it is hidden, and she does not know where it is. A gun. My husband keeps it locked up and I don't have the combination. It's a 2 layer safe.  Are These Weapons Safely Secured?                            Yes (Unsure)  Who Could Verify You Are Able To Have These Secured: No data recorded Do You Have any Outstanding Charges, Pending Court Dates, Parole/Probation? denies  Contacted To Inform of Risk of Harm To Self or Others: No data recorded  Location of Assessment: Other (comment)   Does Patient Present under Involuntary Commitment? No  IVC Papers Initial File Date: No data recorded  [place] of Residence: [place]   Patient Currently Receiving the Following Services: Medication Management; Individual Therapy   Determination of Need: Routine (7 days)   Options For Referral: Partial Hospitalization     CCA Biopsychosocial Intake/Chief Complaint:  Jamie [name] reports to [facility] per Dr. Tasia, psychiatrist. Stressors include: 1) Trauma: Nephew murdered in 2021 at age 20 on her front porch. 2) Grief: father-in-law last month, nephew, aunt, grandmother, Dad. 3) MH symptoms increased recently. 4) Health issues: problems with diabetes and pancreatitis- has to take meds after every time she eats. She reports she has had 7 mental health hospitalizations between 2012-2019, 2 at [facility] and 5 at [facility]. She reports continued pSI, denies intent/plan. PF: God, Mom. She denies HI/AVH- reports she has heard voices in the past that tell her to harm herself. She  reports medications help at this time. She denies attempts, drug use, NSSIB. Supports include Mom, Sister, Husband, dog. She lives with her mom, husband, sister, and dog.  Current Symptoms/Problems: increased depression; constant pSI; grief; increased anxiety- especially related to death of mother; irritability; appetite: varies; sleep: good; ADLs decreased; anhedonia;   Patient Reported Schizophrenia/Schizoaffective Diagnosis in Past: Yes   Strengths: supports, motivation for treatment  Preferences: to learn coping skills  Abilities: can attend and participate in treatment   Type of Services Patient Feels are Needed: PHP   Initial Clinical Notes/Concerns: No data recorded  Mental Health Symptoms Depression:  Change in energy/activity; Fatigue; Irritability; Difficulty Concentrating   Duration of Depressive symptoms: Greater than two weeks   Mania:  None   Anxiety:   Difficulty concentrating; Irritability; Fatigue; Worrying   Psychosis:  None (None at this time)   Duration of Psychotic symptoms: No data recorded  Trauma:  Emotional numbing   Obsessions:  None   Compulsions:  None   Inattention:  None   Hyperactivity/Impulsivity:  None   Oppositional/Defiant Behaviors:  None   Emotional Irregularity:  None   Other Mood/Personality Symptoms:  No data recorded   Mental Status Exam Appearance and self-care  Stature:  Average  Weight:  Overweight   Clothing:  Disheveled   Grooming:  Neglected   Cosmetic use:  None   Posture/gait:  Stooped   Motor activity:  Not Remarkable   Sensorium  Attention:  Normal   Concentration:  Normal   Orientation:  X5   Recall/memory:  Normal   Affect and Mood  Affect:  Appropriate; Depressed   Mood:  Depressed   Relating  Eye contact:  None   Facial expression:  Depressed   Attitude toward examiner:  Cooperative   Thought and Language  Speech flow: Normal   Thought content:  Appropriate to Mood and  Circumstances   Preoccupation:  None   Hallucinations:  None   Organization:  No data recorded  [employer] of Knowledge:  Average   Intelligence:  Average   Abstraction:  Normal   Judgement:  Fair   Reality Testing:  Adequate   Insight:  Fair   Decision Making:  Normal; Paralyzed; Vacilates   Social Functioning  Social Maturity:  Responsible   Social Judgement:  Normal   Stress  Stressors:  Grief/losses; Illness   Coping Ability:  Exhausted   Skill Deficits:  Activities of daily living   Supports:  Family; Support needed     Religion: Religion/Spirituality Are You A Religious Person?: Yes What is Your Religious Affiliation?: Christian  Leisure/Recreation: Leisure / Recreation Do You Have Hobbies?: Yes Leisure and Hobbies: not anymore  Exercise/Diet: Exercise/Diet Do You Exercise?: No Have You Gained or Lost A Significant Amount of Weight in the Past Six Months?: No Do You Follow a Special Diet?: Yes Type of Diet: tries to limit sugar and salt Do You Have Any Trouble Sleeping?: No   CCA Employment/Education Employment/Work Situation: Employment / Work Systems developer: On disability Why is Patient on Disability: mental issues How Long has Patient Been on Disability: about 13-14 years Patient's Job has Been Impacted by Current Illness: Yes Describe how Patient's Job has Been Impacted: unable to hold a job What is the Longest Time Patient has Held a Job?: 6.5 years Where was the Patient Employed at that Time?: [employer] Has Patient ever Been in the 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 park manager?: Yes What Type of College Degree Do you Have?: I have one semester left at [employer] Did You Have An Individualized Education Program (IIEP): No Did You Have Any Difficulty At School?: No Patient's Education Has Been  Impacted by Current Illness: No   CCA Family/Childhood History Family and Relationship History: Family history Marital status: Married Number of Years Married: 7 What types of issues is patient dealing with in the relationship?: denies Are you sexually active?: Yes What is your sexual orientation?: heterosexual Does patient have children?: No  Childhood History:  Childhood History By whom was/is the patient raised?: Both parents Additional childhood history information: It was good, I had the best parents Bailee stated Description of patient's relationship with caregiver when they were a child: Shayanne stated she has had a good relationship with both parents when she was young. My dad died in 39 Genasis added. Patient's description of current relationship with people who raised him/her: Mother: good How were you disciplined when you got in trouble as a child/adolescent?: She stated she was not bad, she saw her older sister make mistakes and be sent to her room, I was never bad Does patient have siblings?: Yes Number  of Siblings: 1 Description of patient's current relationship with siblings: older sister: very good, we are very close Did patient suffer any verbal/emotional/physical/sexual abuse as a child?: No Did patient suffer from severe childhood neglect?: No Has patient ever been sexually abused/assaulted/raped as an adolescent or adult?: No Was the patient ever a victim of a crime or a disaster?: No Witnessed domestic violence?: No Has patient been affected by domestic violence as an adult?: No  Child/Adolescent Assessment:     CCA Substance Use Alcohol /Drug Use: Alcohol  / Drug Use Pain Medications: Please see MAR Prescriptions: Please see MAR Over the Counter: Please see MAR History of alcohol  / drug use?: No history of alcohol  / drug abuse Longest period of sobriety (when/how long): [name]                         ASAM's:  Six Dimensions of  Multidimensional Assessment  Dimension 1:  Acute Intoxication and/or Withdrawal Potential:      Dimension 2:  Biomedical Conditions and Complications:      Dimension 3:  Emotional, Behavioral, or Cognitive Conditions and Complications:     Dimension 4:  Readiness to Change:     Dimension 5:  Relapse, Continued use, or Continued Problem Potential:     Dimension 6:  Recovery/Living Environment:     ASAM Severity Score:    ASAM Recommended Level of Treatment:     Substance use Disorder (SUD)    Recommendations for Services/Supports/Treatments: Recommendations for Services/Supports/Treatments Recommendations For Services/Supports/Treatments: Partial Hospitalization  DSM5 Diagnoses: Patient Active Problem List   Diagnosis Date Noted   ERRONEOUS ENCOUNTER--DISREGARD 09/16/2023   Cardiac chest pain 08/10/2023   Asthmatic bronchitis , chronic (HCC) 06/28/2023   DOE (dyspnea on exertion) 06/28/2023   Atypical chest pain 04/26/2023   Hypertriglyceridemia without hypercholesterolemia 01/28/2023   DM2 (diabetes mellitus, type 2) (HCC) 01/26/2023   Acute pancreatitis 01/26/2023   Chronic diastolic (congestive) heart failure (HCC) 05/04/2022   Class 2 obesity with body mass index (BMI) of 38.0 to 38.9 in adult 05/04/2022   Chronic pancreatitis (HCC) 05/03/2022   Xerostomia 11/06/2020   Pneumonia due to COVID-19 virus 09/06/2020   GERD (gastroesophageal reflux disease) 03/20/2020   Dysphagia 03/20/2020   Wheezing 02/22/2020   Prolapsed internal hemorrhoids, grade 4 12/05/2019   Abdominal pain, chronic, epigastric 09/18/2019   Chronic diarrhea 06/12/2019   Common migraine 11/03/2016   OSA on CPAP 11/03/2016   Essential hypertension 02/01/2014   Lumbar herniated disc 01/22/2014   Headache 12/06/2012   Bipolar I disorder, severe, current or most recent episode depressed, with psychotic features (HCC) 11/15/2012   Severe major depression with psychotic features, mood-congruent (HCC)  05/03/2012   Generalized anxiety disorder 06/09/2011   Gastroesophageal reflux disease 12/22/2010    Patient Centered Plan: Patient is on the following Treatment Plan(s):  Depression   Referrals to Alternative Service(s): Referred to Alternative Service(s):   Place:   Date:   Time:    Referred to Alternative Service(s):   Place:   Date:   Time:    Referred to Alternative Service(s):   Place:   Date:   Time:    Referred to Alternative Service(s):   Place:   Date:   Time:      Collaboration of Care: Psychiatrist AEB referral from Dr. Tasia  Patient/Guardian was advised Release of Information must be obtained prior to any record release in order to collaborate their care with an outside provider. Patient/Guardian was advised if  they have not already done so to contact the registration department to sign all necessary forms in order for us  to release information regarding their care.   Consent: Patient/Guardian gives verbal consent for treatment and assignment of benefits for services provided during this visit. Patient/Guardian expressed understanding and agreed to proceed.   Jamie [name], [facility]

## 2024-01-19 ENCOUNTER — Encounter: Payer: Self-pay | Admitting: Gastroenterology

## 2024-01-19 ENCOUNTER — Other Ambulatory Visit: Payer: Self-pay | Admitting: Nurse Practitioner

## 2024-01-24 ENCOUNTER — Other Ambulatory Visit (HOSPITAL_COMMUNITY)

## 2024-01-24 DIAGNOSIS — Z23 Encounter for immunization: Secondary | ICD-10-CM | POA: Diagnosis not present

## 2024-01-24 DIAGNOSIS — N63 Unspecified lump in unspecified breast: Secondary | ICD-10-CM | POA: Diagnosis not present

## 2024-01-24 DIAGNOSIS — Z0000 Encounter for general adult medical examination without abnormal findings: Secondary | ICD-10-CM | POA: Diagnosis not present

## 2024-01-24 DIAGNOSIS — E1142 Type 2 diabetes mellitus with diabetic polyneuropathy: Secondary | ICD-10-CM | POA: Diagnosis not present

## 2024-01-25 ENCOUNTER — Other Ambulatory Visit (HOSPITAL_COMMUNITY)

## 2024-01-25 ENCOUNTER — Encounter (HOSPITAL_COMMUNITY): Payer: Self-pay

## 2024-01-26 ENCOUNTER — Other Ambulatory Visit (HOSPITAL_COMMUNITY)

## 2024-01-26 ENCOUNTER — Telehealth (HOSPITAL_COMMUNITY): Payer: Self-pay | Admitting: Professional

## 2024-01-26 NOTE — Telephone Encounter (Signed)
 See call log

## 2024-01-27 ENCOUNTER — Other Ambulatory Visit (HOSPITAL_COMMUNITY)

## 2024-01-28 ENCOUNTER — Other Ambulatory Visit: Payer: Self-pay | Admitting: Internal Medicine

## 2024-01-28 DIAGNOSIS — K529 Noninfective gastroenteritis and colitis, unspecified: Secondary | ICD-10-CM

## 2024-01-28 DIAGNOSIS — R7989 Other specified abnormal findings of blood chemistry: Secondary | ICD-10-CM

## 2024-01-28 DIAGNOSIS — Z8719 Personal history of other diseases of the digestive system: Secondary | ICD-10-CM

## 2024-01-30 ENCOUNTER — Other Ambulatory Visit (HOSPITAL_COMMUNITY): Attending: Psychiatry | Admitting: Licensed Clinical Social Worker

## 2024-01-30 ENCOUNTER — Other Ambulatory Visit (HOSPITAL_COMMUNITY): Attending: Psychiatry

## 2024-01-30 DIAGNOSIS — F251 Schizoaffective disorder, depressive type: Secondary | ICD-10-CM

## 2024-01-31 ENCOUNTER — Other Ambulatory Visit (HOSPITAL_COMMUNITY)

## 2024-02-01 ENCOUNTER — Other Ambulatory Visit (HOSPITAL_COMMUNITY)

## 2024-02-02 ENCOUNTER — Other Ambulatory Visit: Payer: Self-pay | Admitting: Internal Medicine

## 2024-02-02 ENCOUNTER — Other Ambulatory Visit (HOSPITAL_COMMUNITY)

## 2024-02-02 DIAGNOSIS — S59901A Unspecified injury of right elbow, initial encounter: Secondary | ICD-10-CM | POA: Diagnosis not present

## 2024-02-03 ENCOUNTER — Other Ambulatory Visit (HOSPITAL_COMMUNITY)

## 2024-02-05 NOTE — Psych (Signed)
 Pt on PHP schedule in error. Pt not admitted.

## 2024-02-06 ENCOUNTER — Other Ambulatory Visit (HOSPITAL_COMMUNITY)

## 2024-02-07 ENCOUNTER — Other Ambulatory Visit (HOSPITAL_COMMUNITY)

## 2024-02-08 ENCOUNTER — Other Ambulatory Visit (HOSPITAL_COMMUNITY)

## 2024-02-08 DIAGNOSIS — R5383 Other fatigue: Secondary | ICD-10-CM | POA: Diagnosis not present

## 2024-02-08 DIAGNOSIS — K219 Gastro-esophageal reflux disease without esophagitis: Secondary | ICD-10-CM | POA: Diagnosis not present

## 2024-02-08 DIAGNOSIS — I10 Essential (primary) hypertension: Secondary | ICD-10-CM | POA: Diagnosis not present

## 2024-02-08 DIAGNOSIS — E7849 Other hyperlipidemia: Secondary | ICD-10-CM | POA: Diagnosis not present

## 2024-02-08 DIAGNOSIS — E1143 Type 2 diabetes mellitus with diabetic autonomic (poly)neuropathy: Secondary | ICD-10-CM | POA: Diagnosis not present

## 2024-02-08 DIAGNOSIS — E1165 Type 2 diabetes mellitus with hyperglycemia: Secondary | ICD-10-CM | POA: Diagnosis not present

## 2024-02-08 DIAGNOSIS — E782 Mixed hyperlipidemia: Secondary | ICD-10-CM | POA: Diagnosis not present

## 2024-02-08 DIAGNOSIS — Z0001 Encounter for general adult medical examination with abnormal findings: Secondary | ICD-10-CM | POA: Diagnosis not present

## 2024-02-09 ENCOUNTER — Other Ambulatory Visit (HOSPITAL_COMMUNITY)

## 2024-02-09 NOTE — Progress Notes (Signed)
 Cardiology Office Note:  .   Date: 02/10/2024 ID:  Jamie [name], DOB [birth year], MRN [id number] PCP: Jolee Greig VEAR DEVONNA  [facility] Providers Cardiologist:  Diannah SHAUNNA Maywood, MD    History of Present Illness: Jamie [name]   Jamie [name] is a 53 y.o. female with a PMH of chest pain, HTN, HLD, T2DM, DOE, hypertriglyceridemia, hx of pancreatitis, hepatic steatosis, and morbid obesity, and OSA on CPAP, who presents today for follow-up.   TTE and NST in 2021 were unremarkable.   Last seen by Dr. Mallipeddi on 08/09/2023. She noted exertional, ongoing chest tightness for 6 months to 1 year, around 2 times per month, lasting minutes and resolving with rest.   CCTA 08/2023 revealed coronary calcium  score of 320.  CT FFR analysis showed significant focal lesion along mid LAD (FFR 0.74). Based on this result, Dr. Mallipeddi recommended close follow-up in outpatient clinic to re-evaluate symptoms and set up for [facility]   09/27/2023 - Today she presents for follow-up. She states she has been getting over strep throat recently, just finished her antibiotic for this. Continues to note exertional, stable ongoing chest tightness. Denies any shortness of breath, palpitations, syncope, presyncope, dizziness, orthopnea, PND, swelling or significant weight changes, acute bleeding, or claudication.   Underwent left heart cath on 10/03/2023 - see report below.   11/04/2023 - Today she presents for follow-up. She says her CP has improved a lot since I last saw her, but continues to note symptoms. Denies any shortness of breath, palpitations, syncope, presyncope, dizziness, orthopnea, PND, swelling or significant weight changes, acute bleeding, or claudication.  02/10/2024 - Here for follow-up. Symptoms have continued to improve since I last saw her. She denies any acute cardiac complaints or issues for this visit. Denies any chest pain, shortness of breath, palpitations, syncope, presyncope, dizziness, orthopnea,  PND, swelling or significant weight changes, acute bleeding, or claudication.  ROS: Negative. See HPI.  SH: Her husband is also a patient of mine.   Studies Reviewed: Jamie [name]    EKG: EKG is not ordered today.      Left heart cath 09/2023:   Mid LAD lesion is 30% stenosed.   There is mild left ventricular systolic dysfunction.   Recommend Aspirin  81mg  daily for moderate CAD.   Left Heart Catheterization 10/03/23: Hemodynamic data: LVEDP mildly elevated at 18 mmHg.  There is no pressure gradient across the aortic valve.   Angiographic data: LM: Large-caliber vessel.  It is smooth and normal. LAD: Gives origin to a very large proximal D1 and a moderate-sized D2, at the origin of D2 there is a focal calcific 30% stenosis.  Otherwise the LAD is smooth and normal. LCx: Is a moderate caliber vessel, smooth and normal.  There is no significant marginal branches. RCA: Large-caliber vessel, smooth and normal, gives origin to a large PDA and large PL branch.         Impression and recommendations: Probably intermediate stenosis in the mid LAD that can be treated medically, she is only on single antianginal agent, consider adding Ranexa  for microvascular disease as well.  If she has recurrence of chest discomfort then will consider IVUS guided PCI to the LAD however after review of her coronary anatomy and also coronary CT FFR, I feel medical therapy is probably most optimal.  Please see images above.  CCTA 08/2023:  IMPRESSION: 1. Moderate stenosis, CADRADS = 3. CT FFR will be performed and reported separately.   2. Coronary calcium  score of 320. This  was 99th percentile for age-, sex-, and race- matched controls.   3. Total plaque volume 305 mm3 which is 86th percentile for age- and sex- matched controls (calcified plaque 85 mm3; noncalcified plaque 220 mm3). Total plaque volume is mild/moderate/severe/extensive.   4. Normal coronary origin with right dominance.   INTERPRETATION:    CAD-RADS 3: Moderate stenosis (50-69%). Consider symptom-guided anti-ischemic pharmacotherapy as well as risk factor modification per guideline directed care. Additional analysis with CT FFR will be submitted.  IMPRESSION: 1. CT FFR analysis shows significant focal stenosis in mid LAD (FFR 0.74).  IMPRESSION: 1. No acute or unexpected findings in the chest. 2. Hepatic steatosis.  Echo 02/2023:  1. Left ventricular ejection fraction, by estimation, is 60 to 65%. The  left ventricle has normal function. The left ventricle has no regional  wall motion abnormalities. Left ventricular diastolic parameters were  normal.   2. Right ventricular systolic function is normal. The right ventricular  size is normal.   3. The mitral valve is grossly normal. No evidence of mitral valve  regurgitation. No evidence of mitral stenosis.   4. The aortic valve was not well visualized. Aortic valve regurgitation  is not visualized. No aortic stenosis is present.   Comparison(s): No significant change from prior study.  Lexiscan  03/2020:  The left ventricular ejection fraction is normal (55-65%). Nuclear stress EF: 59%. There was no ST segment deviation noted during stress. The study is normal. This is a low risk study.   Risk Assessment/Calculations:       The 10-year ASCVD risk score (Arnett DK, et al., 2019) is: 8.1%   Values used to calculate the score:     Age: 66 years     Clincally relevant sex: Female     Is Non-Hispanic African American: No     Diabetic: Yes     Tobacco smoker: No     Systolic Blood Pressure: 104 mmHg     Is BP treated: Yes     HDL Cholesterol: 24 mg/dL     Total Cholesterol: 249 mg/dL  Physical Exam:   VS:  BP 104/70   Pulse 89   Ht 5' 3 (1.6 m)   Wt 233 lb 12.8 oz (106.1 kg)   LMP  (LMP Unknown)   SpO2 97%   BMI 41.42 kg/m    Wt Readings from Last 3 Encounters:  02/10/24 233 lb 12.8 oz (106.1 kg)  11/11/23 227 lb 9.6 oz (103.2 kg)  11/04/23 227 lb 6.4  oz (103.1 kg)    GEN: Morbidly obese, 53 y.o. female in no acute distress NECK: No JVD; No carotid bruits CARDIAC: S1/S2, RRR, no murmurs, rubs, gallops RESPIRATORY:  Clear to auscultation without rales, wheezing or rhonchi  ABDOMEN: Soft, non-tender, non-distended EXTREMITIES:  No edema; No deformity   ASSESSMENT AND PLAN: .    CAD Notes continued improvement to her CP, past CCTA showed moderate stenosis along mLAD, FFR 0.74. LHC 09/2023 showed moderate CAD, recommended medical management. If she was noted to have recurrent chest discomfort, was recommended to consider IVUS guided PCI to LAD. Denies any recent symptoms. Continue current medication regimen. Heart healthy diet and regular cardiovascular exercise encouraged. Care and ED precautions discussed.   2. HTN BP stable. Discussed to monitor BP at home at least 2 hours after medications and sitting for 5-10 minutes. Continue current medication regimen. Heart healthy diet and regular cardiovascular exercise encouraged.   3. HLD, hypertriglyceridemia Recent LDL was unable to be calculated d/t severely  elevated TG level. Continue current medication regimen. Heart healthy diet and regular cardiovascular exercise encouraged. Continue to follow with PCP. Not addressed, but at next visit, consider referral to lipid clinic.   4. Obesity Weight loss via diet and exercise encouraged. Discussed the impact being overweight would have on cardiovascular risk.  5. OSA on CPAP Encouraged continued compliance.   Dispo: Follow-up with MD/APP in 6 months or sooner if anything changes.   Signed, Almarie Crate, NP

## 2024-02-10 ENCOUNTER — Other Ambulatory Visit (HOSPITAL_COMMUNITY)

## 2024-02-10 ENCOUNTER — Ambulatory Visit: Attending: Nurse Practitioner | Admitting: Nurse Practitioner

## 2024-02-10 ENCOUNTER — Encounter: Payer: Self-pay | Admitting: Nurse Practitioner

## 2024-02-10 VITALS — BP 104/70 | HR 89 | Ht 63.0 in | Wt 233.8 lb

## 2024-02-10 DIAGNOSIS — E669 Obesity, unspecified: Secondary | ICD-10-CM

## 2024-02-10 DIAGNOSIS — I10 Essential (primary) hypertension: Secondary | ICD-10-CM | POA: Diagnosis not present

## 2024-02-10 DIAGNOSIS — E785 Hyperlipidemia, unspecified: Secondary | ICD-10-CM | POA: Diagnosis not present

## 2024-02-10 DIAGNOSIS — E781 Pure hyperglyceridemia: Secondary | ICD-10-CM | POA: Diagnosis not present

## 2024-02-10 DIAGNOSIS — I2510 Atherosclerotic heart disease of native coronary artery without angina pectoris: Secondary | ICD-10-CM

## 2024-02-10 DIAGNOSIS — G4733 Obstructive sleep apnea (adult) (pediatric): Secondary | ICD-10-CM

## 2024-02-10 MED ORDER — LIDOCAINE 5 % EX PTCH: 1.0000 | MEDICATED_PATCH | CUTANEOUS | Status: DC | PRN

## 2024-02-10 NOTE — Patient Instructions (Addendum)
 Medication Instructions:  Continue all current medications.   Labwork: none  Testing/Procedures: none  Follow-Up: 6 months   Any Other Special Instructions Will Be Listed Below (If Applicable).   If you need a refill on your cardiac medications before your next appointment, please call your pharmacy.

## 2024-02-13 ENCOUNTER — Other Ambulatory Visit (HOSPITAL_COMMUNITY)

## 2024-02-13 DIAGNOSIS — M545 Low back pain, unspecified: Secondary | ICD-10-CM | POA: Diagnosis not present

## 2024-02-13 DIAGNOSIS — L309 Dermatitis, unspecified: Secondary | ICD-10-CM | POA: Diagnosis not present

## 2024-02-13 DIAGNOSIS — E1142 Type 2 diabetes mellitus with diabetic polyneuropathy: Secondary | ICD-10-CM | POA: Diagnosis not present

## 2024-02-14 ENCOUNTER — Other Ambulatory Visit (HOSPITAL_COMMUNITY)

## 2024-02-15 ENCOUNTER — Other Ambulatory Visit (HOSPITAL_COMMUNITY)

## 2024-02-16 ENCOUNTER — Other Ambulatory Visit (HOSPITAL_COMMUNITY)

## 2024-02-17 ENCOUNTER — Other Ambulatory Visit (HOSPITAL_COMMUNITY)

## 2024-02-20 ENCOUNTER — Other Ambulatory Visit (HOSPITAL_COMMUNITY)

## 2024-02-20 DIAGNOSIS — N6311 Unspecified lump in the right breast, upper outer quadrant: Secondary | ICD-10-CM | POA: Diagnosis not present

## 2024-02-20 DIAGNOSIS — N6001 Solitary cyst of right breast: Secondary | ICD-10-CM | POA: Diagnosis not present

## 2024-02-21 ENCOUNTER — Other Ambulatory Visit (HOSPITAL_COMMUNITY)

## 2024-02-21 ENCOUNTER — Telehealth: Payer: Self-pay | Admitting: *Deleted

## 2024-02-21 DIAGNOSIS — M25511 Pain in right shoulder: Secondary | ICD-10-CM | POA: Diagnosis not present

## 2024-02-21 DIAGNOSIS — M25521 Pain in right elbow: Secondary | ICD-10-CM | POA: Diagnosis not present

## 2024-02-21 DIAGNOSIS — M75101 Unspecified rotator cuff tear or rupture of right shoulder, not specified as traumatic: Secondary | ICD-10-CM | POA: Diagnosis not present

## 2024-02-21 NOTE — Telephone Encounter (Signed)
 Patient left a message that her blood sugars have been high the last 3 days. In the 400. She reports that she does not feel bad or anything.  Patient  called and her CGM downloaded. Patient is taking 30-36 per sliding scale. Lantus  injecting 100 units at night. Metformin  500 mg daily with breakfast.  Patient states that she has only had a headache. She has compared the readings from her CGM to doing finger sticks. States that they are both the close.  She also ask about Jardiance, because when she was on that before it really helped her blood sugars.  Patient was advised that if blood sugars continue to run high , or she become symptomatic to go to the ED.  Printed data from her CGM.

## 2024-02-22 ENCOUNTER — Encounter (HOSPITAL_COMMUNITY): Payer: Self-pay | Admitting: Psychiatry

## 2024-02-22 ENCOUNTER — Ambulatory Visit (HOSPITAL_COMMUNITY): Admitting: Psychiatry

## 2024-02-22 ENCOUNTER — Other Ambulatory Visit (HOSPITAL_COMMUNITY)

## 2024-02-22 ENCOUNTER — Other Ambulatory Visit: Payer: Self-pay

## 2024-02-22 VITALS — BP 113/73 | HR 97 | Ht 63.0 in | Wt 239.0 lb

## 2024-02-22 DIAGNOSIS — F324 Major depressive disorder, single episode, in partial remission: Secondary | ICD-10-CM

## 2024-02-22 MED ORDER — TRAZODONE HCL 100 MG PO TABS: 400.0000 mg | ORAL_TABLET | Freq: Every day | ORAL | 1 refills | Status: DC

## 2024-02-22 MED ORDER — CLONAZEPAM 1 MG PO TABS: ORAL_TABLET | ORAL | 4 refills | Status: DC

## 2024-02-22 MED ORDER — AUVELITY 45-105 MG PO TBCR: EXTENDED_RELEASE_TABLET | ORAL | 3 refills | Status: DC

## 2024-02-22 MED ORDER — ARIPIPRAZOLE 20 MG PO TABS: 20.0000 mg | ORAL_TABLET | Freq: Every day | ORAL | 1 refills | Status: DC

## 2024-02-22 NOTE — Telephone Encounter (Signed)
 Patient was called and given Whitney's recommendations. She agreed that she would keep the food diary until her August visit.She also verbalized understanding to the change in the short acting insulin  and the foods, drinks to avoid.

## 2024-02-22 NOTE — Telephone Encounter (Signed)
 Jardiance/Farxiga may have contributed to UTI and yeast infections for her in the past, so I do not think adding it back will help.  Advise her to keep a food diary until she sees me on 8/7 that way we can look to see why her sugars are so high even with her being on high dose insulin .   In the meantime, have her increase the short-acting insulin  to 35-41 units TID.  She needs to avoid all sugary drinks, eats 3 meals per day, avoid processed foods altogether.

## 2024-02-22 NOTE — Progress Notes (Signed)
 Patient ID: Jamie [name], female   DOB: [birth year], 53 y.o.   MRN: [id number] [facility] MD Progress Note  02/22/2024 3:02 PM Jamie [name]  MRN:  [id number] Subjective:  Feeling well. Principal Problem: Major depression recurrent    Today the patient is seen with her husband Jamie [name].  The patient is doing better.  She is no longer suicidal.  It turns out that the IOP program here did not work out for her.  Unbeknownst to be she had 2 other significant therapy components.  She actually meets with the support group every month.  These are women and men who are suffering and grieving from the death of the loved 1.  It is a group that meets on its own and they meet regularly every month have dinner and talk about their experiences.  The patient also continues in one-to-one therapy.  Time together with these 2 supports have been very helpful.  But what also has been helpful is the discontinue of Wellbutrin  in the beginning on Auvelity  twice daily.  The patient's husband is very supportive.  The patient takes all the rest of her medicines as prescribed.  The patient is doing much better.  She has agreed to call if things change. Virtual Visit via Telephone Note  I connected with Jamie [name] on 05/13/2021 at  2:30 PM EDT by telephone and verified that I am speaking with the correct person using two identifiers.  Location: Patient: home Provider: office   I discussed the limitations, risks, security and privacy concerns of performing an evaluation and management service by telephone and the availability of in person appointments. I also discussed with the patient that there may be a patient responsible charge related to this service. The patient expressed understanding and agreed to proceed.    I discussed the assessment and treatment plan with the patient. The patient was provided an opportunity to ask questions and all were answered. The patient agreed with the plan and demonstrated an  understanding of the instructions.   The patient was advised to call back or seek an in-person evaluation if the symptoms worsen or if the condition fails to improve as anticipated.  I provided 30 minutes of non-face-to-face time during this encounter.   Jamie LILLETTE Lo, MD   Past Medical History:  Diagnosis Date   Anxiety    Arthritis    knees   Bipolar 1 disorder (HCC)    Chronic diarrhea    Diverticulosis of colon    GERD (gastroesophageal reflux disease)    Hiatal hernia    History of concussion    per pt 11/ 2020 w/ no residual   Hyperlipidemia    Hypertension    followed by pcp   (11-29-2019  per pt stated never had stress test)   MDD (major depressive disorder)    Migraine    neurologist--- dr onita   OSA on CPAP    followed by dr dohmeier   PCOS (polycystic ovarian syndrome)    PONV (postoperative nausea and vomiting)    Type 2 diabetes mellitus (HCC)    followed by pcp   (11-29-2019  pt stated checks blood sugar daily in am,  fasting sugar--- 96    Past Surgical History:  Procedure Laterality Date   ANAL FISSURE REPAIR  x3  last one  2007  approx.   BIOPSY  09/06/2019   Procedure: BIOPSY;  Surgeon: Shaaron Lamar HERO, MD;  Location: [facility];  Service: Endoscopy;;  COLONOSCOPY WITH PROPOFOL  [name] 09/06/2019   pancolonic diverticulosis. Segmental biopsy completed with benign biopsies.    ESOPHAGOGASTRODUODENOSCOPY (EGD) WITH PROPOFOL  [name] 06/16/2020   Dr. Shaaron: normal esophagus s/p dilation, normal stomach, duodenum.    ESOPHAGOGASTRODUODENOSCOPY (EGD) WITH PROPOFOL  [name] 02/10/2022   Procedure: ESOPHAGOGASTRODUODENOSCOPY (EGD) WITH PROPOFOL ;  Surgeon: Shaaron Lamar HERO, MD;  Location: [facility];  Service: Endoscopy;  Laterality: [name];  9:45am   EVALUATION UNDER ANESTHESIA WITH HEMORRHOIDECTOMY [name] 12/05/2019   Procedure: ANORECTAL EXAM UNDER ANESTHESIA WITH HEMORRHOIDECTOMY, HEMORRHOIDAL LIGATION/PEXY;  Surgeon: Sheldon Standing, MD;  Location: [facility];  Service: General;  Laterality: [name];  GENERAL AND LOCAL   EVALUATION UNDER ANESTHESIA WITH TEAR DUCT PROBING  infant   KNEE ARTHROSCOPY Right 1994   LAPAROSCOPIC CHOLECYSTECTOMY  08-06-2002  @[facility]    LEFT HEART CATH AND CORONARY ANGIOGRAPHY [name] 10/03/2023   Procedure: LEFT HEART CATH AND CORONARY ANGIOGRAPHY;  Surgeon: Ladona Heinz, MD;  Location: [facility];  Service: Cardiovascular;  Laterality: [name];   LUMBAR LAMINECTOMY/DECOMPRESSION MICRODISCECTOMY Right 01/22/2014   Procedure: LUMBAR FOUR TO FIVE LUMBAR LAMINECTOMY/DECOMPRESSION MICRODISCECTOMY 1 LEVEL;  Surgeon: Catalina HERO Stains, MD;  Location: [facility];  Service: Neurosurgery;  Laterality: Right;  Right L45 diskectomy   MALONEY DILATION [name] 06/16/2020   Procedure: AGAPITO DILATION;  Surgeon: Shaaron Lamar HERO, MD;  Location: [facility];  Service: Endoscopy;  Laterality: [name];   MALONEY DILATION [name] 02/10/2022   Procedure: AGAPITO DILATION;  Surgeon: Shaaron Lamar HERO, MD;  Location: [facility];  Service: Endoscopy;  Laterality: [name];   Family History:  Family History  Problem Relation Age of Onset   COPD Father    Anxiety disorder Maternal Grandmother    Depression Maternal Grandmother    Colon cancer Neg Hx    Colon polyps Neg Hx    Family Psychiatric  History:  Social History:  Social History   Substance and Sexual Activity  Alcohol  Use No     Social History   Substance and Sexual Activity  Drug Use Never    Social History   Socioeconomic History   Marital status: Married    Spouse name: Not on file   Number of children: 0   Years of education: college   Highest education level: Not on file  Occupational History   Occupation: disabled  Tobacco Use   Smoking status: Never   Smokeless tobacco: Never  Vaping Use   Vaping status: Never Used  Substance and Sexual Activity   Alcohol  use: No   Drug use: Never   Sexual activity: Yes    Partners: Male    Birth control/protection: None  Other Topics  Concern   Not on file  Social History Narrative   Patient lives at home alone    Patient is right handed   Patient drinks soda's daily   Social Drivers of Corporate investment banker Strain: Not on file  Food Insecurity: No Food Insecurity (01/27/2023)   Hunger Vital Sign    Worried About Running Out of Food in the Last Year: Never true    Ran Out of Food in the Last Year: Never true  Transportation Needs: No Transportation Needs (01/27/2023)   PRAPARE - Administrator, Civil Service (Medical): No    Lack of Transportation (Non-Medical): No  Physical Activity: Not on file  Stress: Not on file  Social Connections: Not on file   Additional Social History:  Sleep: Fair  Appetite:  Good  Current Medications: Current Outpatient Medications  Medication Sig Dispense Refill   ACCU-CHEK GUIDE test strip      albuterol  (VENTOLIN  HFA) 108 (90 Base) MCG/ACT inhaler Inhale 2 puffs into the lungs every 4 (four) hours as needed for wheezing or shortness of breath. 1 each 3   amLODipine  (NORVASC ) 5 MG tablet Take 1 tablet (5 mg total) by mouth daily. 90 tablet 0   Continuous Glucose Sensor (DEXCOM G7 SENSOR) MISC USE AS DIRECTED AND CHANGE EVERY 10 DAYS 9 each 3   dicyclomine  (BENTYL ) 10 MG capsule Take 1 capsule (10 mg total) by mouth 2 (two) times daily as needed for spasms. 30 capsule 0   DULoxetine  (CYMBALTA ) 60 MG capsule Take 2 capsules (120 mg total) by mouth at bedtime. 60 capsule 4   fenofibrate  micronized (LOFIBRA) 134 MG capsule TAKE ONE CAPSULE BY MOUTH DAILY 90 capsule 2   fluticasone  (FLONASE) 50 MCG/ACT nasal spray Place 1 spray into both nostrils at bedtime as needed for allergies or rhinitis.      furosemide  (LASIX ) 20 MG tablet TAKE 1 TABLET BY MOUTH DAILY 30 tablet 5   gabapentin  (NEURONTIN ) 600 MG tablet Take 600 mg by mouth 2 (two) times daily.     insulin  glargine (LANTUS  SOLOSTAR) 100 UNIT/ML Solostar Pen Inject 100 Units into  the skin at bedtime. 90 mL 3   insulin  lispro (HUMALOG ) 100 UNIT/ML KwikPen Inject 20-26 Units into the skin 3 (three) times daily. 75 mL 3   Insulin  Pen Needle (TRUEPLUS 5-BEVEL PEN NEEDLES) 31G X 6 MM MISC USE TO INJECT insulin  FOUR TIMES DAILY 400 each 3   metFORMIN  (GLUCOPHAGE -XR) 500 MG 24 hr tablet Take 1 tablet (500 mg total) by mouth daily with breakfast. 90 tablet 3   metoprolol  tartrate (LOPRESSOR ) 25 MG tablet TAKE 1 TABLET BY MOUTH TWICE DAILY 180 tablet 1   Multiple Vitamins-Minerals (MULTIVITAMIN WITH MINERALS) tablet Take 1 tablet by mouth daily.     nitroGLYCERIN  (NITROSTAT ) 0.4 MG SL tablet Place 1 tablet (0.4 mg total) under the tongue every 5 (five) minutes x 3 doses as needed (if no relief after 3rd dose proceed to ED or call 911). 25 tablet 2   NON FORMULARY Pt uses a cpap nightly     omega-3 acid ethyl esters (LOVAZA ) 1 g capsule Take 2 g by mouth 2 (two) times daily.     omeprazole  (PRILOSEC) 40 MG capsule TAKE ONE CAPSULE BY MOUTH DAILY 30 capsule 11   ondansetron  (ZOFRAN -ODT) 4 MG disintegrating tablet TAKE 1 TABLET BY MOUTH EVERY 8 HOURS AS NEEDED FOR NAUSEA AND VOMITING 90 tablet 3   Pancrelipase , Lip-Prot-Amyl, (ZENPEP ) 40000-126000 units CPEP TAKE TWO CAPSULES BY MOUTH THREE TIMES DAILY WITH MEALS AND TAKE 1 CAPSULE with AT least 2 snacks 270 capsule 3   ranolazine  (RANEXA ) 500 MG 12 hr tablet Take 1 tablet (500 mg total) by mouth 2 (two) times daily. 180 tablet 1   rosuvastatin  (CRESTOR ) 10 MG tablet TAKE 1 TABLET BY MOUTH AT BEDTIME 90 tablet 1   valsartan  (DIOVAN ) 80 MG tablet Take 1 tablet (80 mg total) by mouth daily. 30 tablet 11   ARIPiprazole  (ABILIFY ) 20 MG tablet Take 1 tablet (20 mg total) by mouth daily. 90 tablet 1   clonazePAM  (KLONOPIN ) 1 MG tablet Take 1 mg in the morning and 2 mg at night 90 tablet 4   Dextromethorphan-buPROPion  ER (AUVELITY ) 45-105 MG TBCR 1 qam for 1 week then  1 bid 60 tablet 3   traZODone  (DESYREL ) 100 MG tablet Take 4 tablets (400  mg total) by mouth at bedtime. 360 tablet 1   No current facility-administered medications for this visit.    Lab Results: No results found for this or any previous visit (from the past 48 hours).  Physical Findings: AIMS:  , ,  ,  ,    CIWA:    COWS:     Musculoskeletal: Strength & Muscle Tone: within normal limits Gait & Station: normal Patient leans: Right  Psychiatric Specialty Exam: ROS  Blood pressure 113/73, pulse 97, height 5' 3 (1.6 m), weight 239 lb (108.4 kg).Body mass index is 42.34 kg/m.  General Appearance: Casual  Eye Contact::  Good  Speech:  Clear and Coherent  Volume:  Normal  Mood:Depressed  Affect:  Appropriate  Today the patient is doing very well. Thought Process:  Coherent  Orientation:  Full (Time, Place, and Person)  Thought Content:  NA and WDL  Suicidal Thoughts:  yes  Homicidal Thoughts:  No  Memory:  Immediate;   NA  Judgement:  Good  Insight:  Good  Psychomotor Activity:  NA  Concentration:  Good  Recall:  Good  Fund of Knowledge:Good  Language: Good  Akathisia:  No  Handed:  Right  AIMS (if indicated):     Assets:  Desire for Improvement  ADL's:  Intact  Cognition: WNL  Sleep:      Treatment Plan Summary: 02/22/2024, 3:02 PM    This patient's diagnosis is major depression chronic.  She just began on Auvelity  and is doing well.  She no longer takes Wellbutrin .  She takes Cymbalta  regularly and continues taking Abilify  20 mg.  The patient takes her Klonopin  as prescribed.  She has major depression and adjustment disorder with an anxious mood state.  She will continue in one-to-one therapy and also in her support group.  The patient will return to see me in 7 weeks.  I believe she is much better and is no longer suicidal.

## 2024-02-23 ENCOUNTER — Other Ambulatory Visit (HOSPITAL_COMMUNITY)

## 2024-02-24 ENCOUNTER — Other Ambulatory Visit (HOSPITAL_COMMUNITY)

## 2024-03-01 ENCOUNTER — Ambulatory Visit: Admitting: Nurse Practitioner

## 2024-03-01 ENCOUNTER — Encounter: Payer: Self-pay | Admitting: Nurse Practitioner

## 2024-03-01 VITALS — BP 98/64 | HR 90 | Ht 63.0 in | Wt 234.6 lb

## 2024-03-01 DIAGNOSIS — Z7984 Long term (current) use of oral hypoglycemic drugs: Secondary | ICD-10-CM | POA: Diagnosis not present

## 2024-03-01 DIAGNOSIS — E782 Mixed hyperlipidemia: Secondary | ICD-10-CM

## 2024-03-01 DIAGNOSIS — I152 Hypertension secondary to endocrine disorders: Secondary | ICD-10-CM

## 2024-03-01 DIAGNOSIS — Z794 Long term (current) use of insulin: Secondary | ICD-10-CM

## 2024-03-01 DIAGNOSIS — E1165 Type 2 diabetes mellitus with hyperglycemia: Secondary | ICD-10-CM | POA: Diagnosis not present

## 2024-03-01 LAB — POCT GLYCOSYLATED HEMOGLOBIN (HGB A1C): Hemoglobin A1C: 11.8 % — AB (ref 4.0–5.6)

## 2024-03-01 MED ORDER — CARETOUCH PEN NEEDLES 31G X 6 MM MISC: 3 refills | Status: DC

## 2024-03-01 MED ORDER — HUMULIN R U-500 KWIKPEN 500 UNIT/ML ~~LOC~~ SOPN: 65.0000 [IU] | PEN_INJECTOR | Freq: Three times a day (TID) | SUBCUTANEOUS | 3 refills | Status: DC

## 2024-03-01 NOTE — Progress Notes (Signed)
 Endocrinology Follow Up Note       03/01/2024, 3:59 PM   Subjective:    Patient ID: Jamie [name], female    DOB: 06/26/1971.  Jamie [name] is being seen in follow up after being seen in consultation for management of currently uncontrolled symptomatic diabetes requested by  Jolee Greig DEL, PA-C.   Past Medical History:  Diagnosis Date   Anxiety    Arthritis    knees   Bipolar 1 disorder (HCC)    Chronic diarrhea    Diverticulosis of colon    GERD (gastroesophageal reflux disease)    Hiatal hernia    History of concussion    per pt 11/ 2020 w/ no residual   Hyperlipidemia    Hypertension    followed by pcp   (11-29-2019  per pt stated never had stress test)   MDD (major depressive disorder)    Migraine    neurologist--- dr onita   OSA on CPAP    followed by dr dohmeier   PCOS (polycystic ovarian syndrome)    PONV (postoperative nausea and vomiting)    Type 2 diabetes mellitus (HCC)    followed by pcp   (11-29-2019  pt stated checks blood sugar daily in am,  fasting sugar--- 96    Past Surgical History:  Procedure Laterality Date   ANAL FISSURE REPAIR  x3  last one  2007  approx.   BIOPSY  09/06/2019   Procedure: BIOPSY;  Surgeon: Shaaron Lamar HERO, MD;  Location: [facility];  Service: Endoscopy;;   COLONOSCOPY WITH PROPOFOL  [name] 09/06/2019   pancolonic diverticulosis. Segmental biopsy completed with benign biopsies.    ESOPHAGOGASTRODUODENOSCOPY (EGD) WITH PROPOFOL  [name] 06/16/2020   Dr. Shaaron: normal esophagus s/p dilation, normal stomach, duodenum.    ESOPHAGOGASTRODUODENOSCOPY (EGD) WITH PROPOFOL  [name] 02/10/2022   Procedure: ESOPHAGOGASTRODUODENOSCOPY (EGD) WITH PROPOFOL ;  Surgeon: Shaaron Lamar HERO, MD;  Location: [facility];  Service: Endoscopy;  Laterality: [name];  9:45am   EVALUATION UNDER ANESTHESIA WITH HEMORRHOIDECTOMY [name] 12/05/2019   Procedure: ANORECTAL EXAM UNDER ANESTHESIA WITH  HEMORRHOIDECTOMY, HEMORRHOIDAL LIGATION/PEXY;  Surgeon: Sheldon Standing, MD;  Location: [facility];  Service: General;  Laterality: [name];  GENERAL AND LOCAL   EVALUATION UNDER ANESTHESIA WITH TEAR DUCT PROBING  infant   KNEE ARTHROSCOPY Right 1994   LAPAROSCOPIC CHOLECYSTECTOMY  08-06-2002  @[facility]    LEFT HEART CATH AND CORONARY ANGIOGRAPHY [name] 10/03/2023   Procedure: LEFT HEART CATH AND CORONARY ANGIOGRAPHY;  Surgeon: Ladona Heinz, MD;  Location: [facility];  Service: Cardiovascular;  Laterality: [name];   LUMBAR LAMINECTOMY/DECOMPRESSION MICRODISCECTOMY Right 01/22/2014   Procedure: LUMBAR FOUR TO FIVE LUMBAR LAMINECTOMY/DECOMPRESSION MICRODISCECTOMY 1 LEVEL;  Surgeon: Catalina HERO Stains, MD;  Location: [facility];  Service: Neurosurgery;  Laterality: Right;  Right L45 diskectomy   MALONEY DILATION [name] 06/16/2020   Procedure: AGAPITO DILATION;  Surgeon: Shaaron Lamar HERO, MD;  [facility];  Service: Endoscopy;  Laterality: [name];   MALONEY DILATION [name] 02/10/2022   Procedure: AGAPITO DILATION;  Surgeon: Shaaron Lamar HERO, MD;  [facility]: [facility];  Service: Endoscopy;  Laterality: [name];    Social History   Socioeconomic History  Marital status: Married    Spouse name: Not on file   Number of children: 0   Years of education: college   Highest education level: Not on file  Occupational History   Occupation: disabled  Tobacco Use   Smoking status: Never   Smokeless tobacco: Never  Vaping Use   Vaping status: Never Used  Substance and Sexual Activity   Alcohol  use: No   Drug use: Never   Sexual activity: Yes    Partners: Male    Birth control/protection: None  Other Topics Concern   Not on file  Social History Narrative   Patient lives at home alone    Patient is right handed   Patient drinks soda's daily   Social Drivers of Corporate investment banker Strain: Not on file  Food Insecurity: No Food Insecurity (01/27/2023)   Hunger Vital Sign    Worried  About Running Out of Food in the Last Year: Never true    Ran Out of Food in the Last Year: Never true  Transportation Needs: No Transportation Needs (01/27/2023)   PRAPARE - Administrator, Civil Service (Medical): No    Lack of Transportation (Non-Medical): No  Physical Activity: Not on file  Stress: Not on file  Social Connections: Not on file    Family History  Problem Relation Age of Onset   COPD Father    Anxiety disorder Maternal Grandmother    Depression Maternal Grandmother    Colon cancer Neg Hx    Colon polyps Neg Hx     Outpatient Encounter Medications as of 03/01/2024  Medication Sig   ACCU-CHEK GUIDE test strip    albuterol  (VENTOLIN  HFA) 108 (90 Base) MCG/ACT inhaler Inhale 2 puffs into the lungs every 4 (four) hours as needed for wheezing or shortness of breath.   amLODipine  (NORVASC ) 5 MG tablet Take 1 tablet (5 mg total) by mouth daily.   ARIPiprazole  (ABILIFY ) 20 MG tablet Take 1 tablet (20 mg total) by mouth daily.   clonazePAM  (KLONOPIN ) 1 MG tablet Take 1 mg in the morning and 2 mg at night   Continuous Glucose Sensor (DEXCOM G7 SENSOR) MISC USE AS DIRECTED AND CHANGE EVERY 10 DAYS   Dextromethorphan-buPROPion  ER (AUVELITY ) 45-105 MG TBCR 1 qam for 1 week then 1 bid   dicyclomine  (BENTYL ) 10 MG capsule Take 1 capsule (10 mg total) by mouth 2 (two) times daily as needed for spasms.   DULoxetine  (CYMBALTA ) 60 MG capsule Take 2 capsules (120 mg total) by mouth at bedtime.   fenofibrate  micronized (LOFIBRA) 134 MG capsule TAKE ONE CAPSULE BY MOUTH DAILY   furosemide  (LASIX ) 20 MG tablet TAKE 1 TABLET BY MOUTH DAILY   gabapentin  (NEURONTIN ) 600 MG tablet Take 600 mg by mouth 2 (two) times daily.   Insulin  Pen Needle (CARETOUCH PEN NEEDLES) 31G X 6 MM MISC Use to inject insulin  3 times daily   insulin  regular human CONCENTRATED (HUMULIN  R U-500 KWIKPEN) 500 UNIT/ML KwikPen Inject 65 Units into the skin 3 (three) times daily with meals.   metFORMIN   (GLUCOPHAGE -XR) 500 MG 24 hr tablet Take 1 tablet (500 mg total) by mouth daily with breakfast.   metoprolol  tartrate (LOPRESSOR ) 25 MG tablet TAKE 1 TABLET BY MOUTH TWICE DAILY   nitroGLYCERIN  (NITROSTAT ) 0.4 MG SL tablet Place 1 tablet (0.4 mg total) under the tongue every 5 (five) minutes x 3 doses as needed (if no relief after 3rd dose proceed to ED or call 911).  NON FORMULARY Pt uses a cpap nightly   omega-3 acid ethyl esters (LOVAZA ) 1 g capsule Take 2 g by mouth 2 (two) times daily.   omeprazole  (PRILOSEC) 40 MG capsule TAKE ONE CAPSULE BY MOUTH DAILY   ondansetron  (ZOFRAN -ODT) 4 MG disintegrating tablet TAKE 1 TABLET BY MOUTH EVERY 8 HOURS AS NEEDED FOR NAUSEA AND VOMITING   Pancrelipase , Lip-Prot-Amyl, (ZENPEP ) 40000-126000 units CPEP TAKE TWO CAPSULES BY MOUTH THREE TIMES DAILY WITH MEALS AND TAKE 1 CAPSULE with AT least 2 snacks   ranolazine  (RANEXA ) 500 MG 12 hr tablet Take 1 tablet (500 mg total) by mouth 2 (two) times daily.   rosuvastatin  (CRESTOR ) 10 MG tablet TAKE 1 TABLET BY MOUTH AT BEDTIME   traZODone  (DESYREL ) 100 MG tablet Take 4 tablets (400 mg total) by mouth at bedtime.   valsartan  (DIOVAN ) 80 MG tablet Take 1 tablet (80 mg total) by mouth daily.   [DISCONTINUED] insulin  glargine (LANTUS  SOLOSTAR) 100 UNIT/ML Solostar Pen Inject 100 Units into the skin at bedtime.   [DISCONTINUED] insulin  lispro (HUMALOG ) 100 UNIT/ML KwikPen Inject 20-26 Units into the skin 3 (three) times daily. (Patient taking differently: Inject 35-41 Units into the skin 3 (three) times daily.)   [DISCONTINUED] Insulin  Pen Needle (TRUEPLUS 5-BEVEL PEN NEEDLES) 31G X 6 MM MISC USE TO INJECT insulin  FOUR TIMES DAILY   fluticasone  (FLONASE) 50 MCG/ACT nasal spray Place 1 spray into both nostrils at bedtime as needed for allergies or rhinitis.    Multiple Vitamins-Minerals (MULTIVITAMIN WITH MINERALS) tablet Take 1 tablet by mouth daily.   No facility-administered encounter medications on file as of  03/01/2024.    ALLERGIES: Allergies  Allergen Reactions   Codeine Hives and Swelling   Ozempic (0.25 Or 0.5 Mg-Dose) [Semaglutide(0.25 Or 0.5mg -Dos)]     pancreatitis   Morphine  And Codeine Swelling and Rash    VACCINATION STATUS: Immunization History  Administered Date(s) Administered   Influenza Inj Mdck Quad Pf 04/26/2019   Moderna Sars-Covid-2 Vaccination 11/13/2019, 12/17/2019    Diabetes She presents for her follow-up diabetic visit. She has type 2 diabetes mellitus. Onset time: diagnosed at approx age of 70. Her disease course has been worsening. There are no hypoglycemic associated symptoms. Associated symptoms include fatigue, foot paresthesias, polydipsia, polyuria and weight loss. There are no hypoglycemic complications. Diabetic complications include peripheral neuropathy. (Hx pancreatitis) Risk factors for coronary artery disease include diabetes mellitus, dyslipidemia, family history, obesity, hypertension and sedentary lifestyle. Current diabetic treatment includes intensive insulin  program and oral agent (monotherapy). She is compliant with treatment most of the time. Her weight is decreasing steadily. She is following a generally unhealthy diet. Meal planning includes avoidance of concentrated sweets and ADA exchanges. She has not had a previous visit with a dietitian. She participates in exercise intermittently. Her home blood glucose trend is fluctuating minimally. Her breakfast blood glucose range is generally >200 mg/dl. Her lunch blood glucose range is generally >200 mg/dl. Her dinner blood glucose range is generally >200 mg/dl. Her bedtime blood glucose range is generally >200 mg/dl. Her overall blood glucose range is >200 mg/dl. (She presents today with her CGM showing gross hyperglycemia overall.  Her POCT A1c today is 11.8%, increasing from last visit of 11.7%.  She denies any hypoglycemia.  Analysis of her CGM shows TIR 0%, TAR 100%, TBR % with a GMI of 12.4%.  She has  not missed any doses of insulin , reports she is eating relatively healthy.) An ACE inhibitor/angiotensin II receptor blocker is being taken. She does not  see a podiatrist.Eye exam is current.    Review of systems  Constitutional: + stable body weight,  current Body mass index is 41.56 kg/m. , no fatigue, no subjective hyperthermia, no subjective hypothermia Eyes: no blurry vision, no xerophthalmia ENT: no sore throat, no nodules palpated in throat, no dysphagia/odynophagia, no hoarseness Cardiovascular: no chest pain, no shortness of breath, no palpitations, no leg swelling Respiratory: no cough, no shortness of breath Gastrointestinal: no nausea/vomiting/diarrhea Musculoskeletal: no muscle/joint aches Skin: no rashes, no hyperemia Neurological: no tremors, no numbness, no tingling, no dizziness Psychiatric: no depression, no anxiety  Objective:     BP 98/64 (BP Location: Right Arm, Patient Position: Sitting, Cuff Size: Large)   Pulse 90   Ht 5' 3 (1.6 m)   Wt 234 lb 9.6 oz (106.4 kg)   LMP  (LMP Unknown)   BMI 41.56 kg/m   Wt Readings from Last 3 Encounters:  03/01/24 234 lb 9.6 oz (106.4 kg)  02/10/24 233 lb 12.8 oz (106.1 kg)  11/11/23 227 lb 9.6 oz (103.2 kg)     BP Readings from Last 3 Encounters:  03/01/24 98/64  02/10/24 104/70  11/11/23 112/80     Physical Exam- Limited  Constitutional:  Body mass index is 41.56 kg/m. , not in acute distress, normal state of mind Eyes:  EOMI, no exophthalmos Musculoskeletal: no gross deformities, strength intact in all four extremities, no gross restriction of joint movements Skin:  no rashes, no hyperemia Neurological: no tremor with outstretched hands   Diabetic Foot Exam - Simple   No data filed     CMP ( most recent) CMP     Component Value Date/Time   NA 134 (L) 09/19/2023 1118   NA 138 09/16/2011 1753   K 3.9 09/19/2023 1118   K 3.9 09/16/2011 1753   CL 97 (L) 09/19/2023 1118   CL 103 09/16/2011 1753    CO2 26 09/19/2023 1118   CO2 23 09/16/2011 1753   GLUCOSE 388 (H) 09/19/2023 1118   GLUCOSE 93 09/16/2011 1753   BUN 24 (H) 09/19/2023 1118   BUN 14 09/16/2011 1753   CREATININE 1.10 (H) 11/08/2023 1744   CREATININE 1.17 (H) 06/03/2022 0812   CALCIUM  9.6 09/19/2023 1118   CALCIUM  9.1 09/16/2011 1753   PROT 6.8 09/19/2023 1118   PROT 7.5 09/16/2011 1753   ALBUMIN  3.7 09/19/2023 1118   ALBUMIN  4.1 09/16/2011 1753   AST 22 09/19/2023 1118   AST 17 09/16/2011 1753   ALT 30 09/19/2023 1118   ALT 23 09/16/2011 1753   ALKPHOS 75 09/19/2023 1118   ALKPHOS 56 09/16/2011 1753   BILITOT 0.6 09/19/2023 1118   BILITOT 0.2 09/16/2011 1753   GFRNONAA >60 09/19/2023 1118   GFRNONAA 79 12/11/2022 1305     Diabetic Labs (most recent): Lab Results  Component Value Date   HGBA1C 11.8 (A) 03/01/2024   HGBA1C 11.7 (A) 10/31/2023   HGBA1C 12.0 (A) 07/29/2023   MICROALBUR 80mg /L 10/31/2023     Lipid Panel ( most recent) Lipid Panel     Component Value Date/Time   CHOL 249 (H) 01/27/2023 0851   TRIG 779 (H) 01/29/2023 0448   HDL 24 (L) 01/27/2023 0851   CHOLHDL 10.4 01/27/2023 0851   VLDL UNABLE TO CALCULATE IF TRIGLYCERIDE OVER 400 mg/dL 92/95/7975 9148   LDLCALC UNABLE TO CALCULATE IF TRIGLYCERIDE OVER 400 mg/dL 92/95/7975 9148   LDLCALC  06/03/2022 0812     Comment:     . LDL cholesterol not  calculated. Triglyceride levels greater than 400 mg/dL invalidate calculated LDL results. . Reference range: <100 . Desirable range <100 mg/dL for primary prevention;   <70 mg/dL for patients with CHD or diabetic patients  with > or = 2 CHD risk factors. SABRA LDL-C is now calculated using the Martin-Hopkins  calculation, which is a validated novel method providing  better accuracy than the Friedewald equation in the  estimation of LDL-C.  Gladis APPLETHWAITE et al. SANDREA. 7986;689(80): 2061-2068  (http://education.[facility].com/faq/FAQ164)    LDLDIRECT 39 01/27/2023 0851      Lab Results   Component Value Date   TSH 4.080 05/19/2018   TSH 2.01 09/16/2011           Assessment & Plan:   1) Type 2 diabetes mellitus with hyperglycemia, with long-term current use of insulin  (HCC)  She presents today with her CGM showing gross hyperglycemia overall.  Her POCT A1c today is 11.8%, increasing from last visit of 11.7%.  She denies any hypoglycemia.  Analysis of her CGM shows TIR 0%, TAR 100%, TBR % with a GMI of 12.4%.  She has not missed any doses of insulin , reports she is eating relatively healthy.  - Jamie [name] has currently uncontrolled symptomatic type 2 DM since 53 years of age.   -Recent labs reviewed.  - I had a long discussion with her about the progressive nature of diabetes and the pathology behind its complications. -her diabetes is complicated by neuropathy and chronic pancreatitis and she remains at a high risk for more acute and chronic complications which include CAD, CVA, CKD, retinopathy, and neuropathy. These are all discussed in detail with her.  The following Lifestyle Medicine recommendations according to American College of Lifestyle Medicine [facility]) were discussed and offered to patient and she agrees to start the journey:  A. Whole Foods, Plant-based plate comprising of fruits and vegetables, plant-based proteins, whole-grain carbohydrates was discussed in detail with the patient.   A list for source of those nutrients were also provided to the patient.  Patient will use only water  or unsweetened tea for hydration. B.  The need to stay away from risky substances including alcohol , smoking; obtaining 7 to 9 hours of restorative sleep, at least 150 minutes of moderate intensity exercise weekly, the importance of healthy social connections,  and stress reduction techniques were discussed. C.  A full color page of  Calorie density of various food groups per pound showing examples of each food groups was provided to the patient.  - Nutritional counseling  repeated at each appointment due to patients tendency to fall back in to old habits.  - The patient admits there is a room for improvement in their diet and drink choices. -  Suggestion is made for the patient to avoid simple carbohydrates from their diet including Cakes, Sweet Desserts / Pastries, Ice Cream, Soda (diet and regular), Sweet Tea, Candies, Chips, Cookies, Sweet Pastries, Store Bought Juices, Alcohol  in Excess of 1-2 drinks a day, Artificial Sweeteners, Coffee Creamer, and Sugar-free Products. This will help patient to have stable blood glucose profile and potentially avoid unintended weight gain.   - I encouraged the patient to switch to unprocessed or minimally processed complex starch and increased protein intake (animal or plant source), fruits, and vegetables.   - Patient is advised to stick to a routine mealtimes to eat 3 meals a day and avoid unnecessary snacks (to snack only to correct hypoglycemia).  - she has already seen Santana Duke, RDN, CDE for diabetes  education.  - I have approached her with the following individualized plan to manage her diabetes and patient agrees:   -Due to high dose insulin , will change her to more concentrated insulin  in hopes it will be absorbed better.  I did change her to Humulin  R-U500 at 65 units TID with meals if glucose is above 90 and she is eating.  She knows NOT to take the Lantus  or Humalog  in addition to this medication.  She can continue Metformin  500 mg ER daily at breakfast.  I did ask she reach out in 2 weeks with readings so we can adjust her insulin .  -she is encouraged to continue monitoring glucose 4 times daily (using her CGM), before meals and before bed (using her CGM), and to call the clinic if she has readings less than 70 or above 300 for 3 tests in a row.   - she is warned not to take insulin  without proper monitoring per orders. - Adjustment parameters are given to her for hypo and hyperglycemia in writing.  - her  Doreen was previously discontinued, risk outweighs benefit for this patient- hx of UTI and yeast infections, and yeast on groin.  - she is NOT a good candidate for GLP1 therapy given hx of chronic pancreatitis and significantly elevated triglycerides.   - Specific targets for  A1c; LDL, HDL, and Triglycerides were discussed with the patient.  2) Blood Pressure /Hypertension:  her blood pressure is controlled to target.   she is advised to continue her current medications including Lisinopril  5 mg p.o. daily with breakfast.  3) Lipids/Hyperlipidemia:    Review of her recent lipid panel from 02/18/23 showed significantly elevated triglycerides of 779, LDL unable to be calculated.  she is advised to continue Crestor  20 mg daily at bedtime.  Side effects and precautions discussed with her.  She has appt with PCP tomorrow and recently had fasting labs, will request copy.  4)  Weight/Diet:  her Body mass index is 41.56 kg/m.  -  clearly complicating her diabetes care.   she is a candidate for weight loss. I discussed with her the fact that loss of 5 - 10% of her  current body weight will have the most impact on her diabetes management.  Exercise, and detailed carbohydrates information provided  -  detailed on discharge instructions.  5) Chronic Care/Health Maintenance: -she is on ACEI/ARB and Statin medications and is encouraged to initiate and continue to follow up with Ophthalmology, Dentist, Podiatrist at least yearly or according to recommendations, and advised to stay away from smoking. I have recommended yearly flu vaccine and pneumonia vaccine at least every 5 years; moderate intensity exercise for up to 150 minutes weekly; and sleep for at least 7 hours a day.  - she is advised to maintain close follow up with Jolee Greig DEL, PA-C for primary care needs, as well as her other providers for optimal and coordinated care.     I spent  53  minutes in the care of the patient today including review  of labs from CMP, Lipids, Thyroid  Function, Hematology (current and previous including abstractions from other facilities); face-to-face time discussing  her blood glucose readings/logs, discussing hypoglycemia and hyperglycemia episodes and symptoms, medications doses, her options of short and long term treatment based on the latest standards of care / guidelines;  discussion about incorporating lifestyle medicine;  and documenting the encounter. Risk reduction counseling performed per USPSTF guidelines to reduce obesity and cardiovascular risk factors.  Please refer to Patient Instructions for Blood Glucose Monitoring and Insulin /Medications Dosing Guide  in media tab for additional information. Please  also refer to  Patient Self Inventory in the Media  tab for reviewed elements of pertinent patient history.  Jamie [name] participated in the discussions, expressed understanding, and voiced agreement with the above plans.  All questions were answered to her satisfaction. she is encouraged to contact clinic should she have any questions or concerns prior to her return visit.     Follow up plan: - Return in about 3 months (around 06/01/2024) for Diabetes F/U with A1c in office, No previsit labs, Bring meter and logs.  Benton Rio, [facility] [address] [place], [place] [address] Phone: [phone] Fax: [phone]  03/01/2024, 3:59 PM

## 2024-03-06 ENCOUNTER — Telehealth: Payer: Self-pay | Admitting: Nurse Practitioner

## 2024-03-06 NOTE — Telephone Encounter (Signed)
 Patient called and said [facility] is waiting on a return call in regards on how pt should inject her insulin /instructions.

## 2024-03-06 NOTE — Telephone Encounter (Signed)
 Pharmacy was called. After reviewing with Jamie Rio, NP and the August office visit note. Alan at [facility] was advised that on March 01 2024, Whitney changed patient to the Humulin  R U500 three times a day if blood sugar is 90 and above and the patient is eating. Patient is NOT to take the Lantus  or Humalog  in addition to this medication.

## 2024-03-08 DIAGNOSIS — M25521 Pain in right elbow: Secondary | ICD-10-CM | POA: Diagnosis not present

## 2024-03-08 DIAGNOSIS — M25511 Pain in right shoulder: Secondary | ICD-10-CM | POA: Diagnosis not present

## 2024-03-19 ENCOUNTER — Ambulatory Visit: Admitting: Gastroenterology

## 2024-03-26 ENCOUNTER — Other Ambulatory Visit (HOSPITAL_COMMUNITY): Payer: Self-pay | Admitting: Psychiatry

## 2024-03-27 DIAGNOSIS — Z23 Encounter for immunization: Secondary | ICD-10-CM | POA: Diagnosis not present

## 2024-04-02 ENCOUNTER — Other Ambulatory Visit: Payer: Self-pay | Admitting: Internal Medicine

## 2024-04-06 ENCOUNTER — Telehealth: Payer: Self-pay | Admitting: *Deleted

## 2024-04-06 DIAGNOSIS — Z794 Long term (current) use of insulin: Secondary | ICD-10-CM

## 2024-04-06 DIAGNOSIS — Z7984 Long term (current) use of oral hypoglycemic drugs: Secondary | ICD-10-CM

## 2024-04-06 MED ORDER — HUMULIN R U-500 KWIKPEN 500 UNIT/ML ~~LOC~~ SOPN: 80.0000 [IU] | PEN_INJECTOR | Freq: Three times a day (TID) | SUBCUTANEOUS | 0 refills | Status: DC

## 2024-04-06 NOTE — Telephone Encounter (Signed)
 Patient states that her blood sugars are staying 200-300's. She states that she has been trying to eat better. She is avoiding sweets, not drinking sugary drinks. She is avoiding anything that states it is sugar free or zero sugar. Rotating her injection site, not observing any insulin  coming from injection site after injection. She has not been on any antibiotics or steroids. Patient has Dexcom G7 and her current CGM data has been downloaded. She is currently injecting Humulin  RU 65 units three times a day.  Whitney reviewed the patients data. She is asking that she increase the Humulin  RU to 80 units three times a day. Patient was called and made aware. A new prescription has been sent to [facility] for the increase of insulin .

## 2024-04-11 ENCOUNTER — Ambulatory Visit (HOSPITAL_COMMUNITY): Admitting: Psychiatry

## 2024-04-18 ENCOUNTER — Encounter (HOSPITAL_COMMUNITY): Payer: Self-pay | Admitting: Psychiatry

## 2024-04-18 ENCOUNTER — Ambulatory Visit (HOSPITAL_COMMUNITY): Admitting: Psychiatry

## 2024-04-18 ENCOUNTER — Other Ambulatory Visit: Payer: Self-pay

## 2024-04-18 VITALS — BP 136/86 | HR 105 | Ht 62.0 in | Wt 236.0 lb

## 2024-04-18 DIAGNOSIS — F325 Major depressive disorder, single episode, in full remission: Secondary | ICD-10-CM

## 2024-04-18 DIAGNOSIS — F323 Major depressive disorder, single episode, severe with psychotic features: Secondary | ICD-10-CM

## 2024-04-18 MED ORDER — TRAZODONE HCL 100 MG PO TABS: 400.0000 mg | ORAL_TABLET | Freq: Every day | ORAL | 1 refills | Status: DC

## 2024-04-18 MED ORDER — AUVELITY 45-105 MG PO TBCR: EXTENDED_RELEASE_TABLET | ORAL | 3 refills | Status: DC

## 2024-04-18 MED ORDER — ARIPIPRAZOLE 20 MG PO TABS: 20.0000 mg | ORAL_TABLET | Freq: Every day | ORAL | 1 refills | Status: DC

## 2024-04-18 MED ORDER — CLONAZEPAM 1 MG PO TABS: ORAL_TABLET | ORAL | 4 refills | Status: DC

## 2024-04-18 MED ORDER — DULOXETINE HCL 60 MG PO CPEP: 120.0000 mg | ORAL_CAPSULE | Freq: Every day | ORAL | 4 refills | Status: DC

## 2024-04-18 NOTE — Progress Notes (Signed)
 Patient ID: Jamie [name], female   DOB: 1970-10-28, 53 y.o.   MRN: [id number] [facility] MD Progress Note  04/18/2024 3:38 PM Jamie [name]  MRN:  [id number] Subjective:  Feeling well. Principal Problem: Major depression recurrent  Today the patient is seen with her husband Ubaldo.  The patient is actually doing very well.  She denies daily depression.  She is eating and doing more around the house.  Her husband says she seems to be enjoying more things and is doing great.  The patient denies any problems with sleep or appetite.  She takes her medicines as prescribed.  She likes where she lives.  She loves her dog who is 12 years old.  The patient's mother is 70 years of age and is doing well.  She is going to have cataract surgery.  The patient's health in general is quite good.  She is stable.  She is positive and optimistic.  She has a good self-worth.  The patient continues in one-to-one therapy.  She takes all her medicines as prescribed.  She denies the use of alcohol  or drugs.  Virtual Visit via Telephone Note  I connected with Jamie [name] on 05/13/2021 at  3:00 PM EDT by telephone and verified that I am speaking with the correct person using two identifiers.  Location: Patient: home Provider: office   I discussed the limitations, risks, security and privacy concerns of performing an evaluation and management service by telephone and the availability of in person appointments. I also discussed with the patient that there may be a patient responsible charge related to this service. The patient expressed understanding and agreed to proceed.    I discussed the assessment and treatment plan with the patient. The patient was provided an opportunity to ask questions and all were answered. The patient agreed with the plan and demonstrated an understanding of the instructions.   The patient was advised to call back or seek an in-person evaluation if the symptoms worsen or if the condition  fails to improve as anticipated.  I provided 30 minutes of non-face-to-face time during this encounter.   Jamie LILLETTE Lo, MD   Past Medical History:  Diagnosis Date   Anxiety    Arthritis    knees   Bipolar 1 disorder (HCC)    Chronic diarrhea    Diverticulosis of colon    GERD (gastroesophageal reflux disease)    Hiatal hernia    History of concussion    per pt 11/ 2020 w/ no residual   Hyperlipidemia    Hypertension    followed by pcp   (11-29-2019  per pt stated never had stress test)   MDD (major depressive disorder)    Migraine    neurologist--- dr onita   OSA on CPAP    followed by dr dohmeier   PCOS (polycystic ovarian syndrome)    PONV (postoperative nausea and vomiting)    Type 2 diabetes mellitus (HCC)    followed by pcp   (11-29-2019  pt stated checks blood sugar daily in am,  fasting sugar--- 96    Past Surgical History:  Procedure Laterality Date   ANAL FISSURE REPAIR  x3  last one  2007  approx.   BIOPSY  09/06/2019   Procedure: BIOPSY;  Surgeon: Shaaron Lamar HERO, MD;  Location: [facility];  Service: Endoscopy;;   COLONOSCOPY WITH PROPOFOL  [name] 09/06/2019   pancolonic diverticulosis. Segmental biopsy completed with benign biopsies.    ESOPHAGOGASTRODUODENOSCOPY (EGD) WITH PROPOFOL   [name] 06/16/2020   Dr. Shaaron: normal esophagus s/p dilation, normal stomach, duodenum.    ESOPHAGOGASTRODUODENOSCOPY (EGD) WITH PROPOFOL  [name] 02/10/2022   Procedure: ESOPHAGOGASTRODUODENOSCOPY (EGD) WITH PROPOFOL ;  Surgeon: Shaaron Lamar HERO, MD;  Location: [facility];  Service: Endoscopy;  Laterality: [name];  9:45am   EVALUATION UNDER ANESTHESIA WITH HEMORRHOIDECTOMY [name] 12/05/2019   Procedure: ANORECTAL EXAM UNDER ANESTHESIA WITH HEMORRHOIDECTOMY, HEMORRHOIDAL LIGATION/PEXY;  Surgeon: Sheldon Standing, MD;  Location: [facility];  Service: General;  Laterality: [name];  GENERAL AND LOCAL   EVALUATION UNDER ANESTHESIA WITH TEAR DUCT PROBING  infant   KNEE ARTHROSCOPY Right 1994    LAPAROSCOPIC CHOLECYSTECTOMY  08-06-2002  @[facility]    LEFT HEART CATH AND CORONARY ANGIOGRAPHY [name] 10/03/2023   Procedure: LEFT HEART CATH AND CORONARY ANGIOGRAPHY;  Surgeon: Ladona Heinz, MD;  Location: [facility];  Service: Cardiovascular;  Laterality: [name];   LUMBAR LAMINECTOMY/DECOMPRESSION MICRODISCECTOMY Right 01/22/2014   Procedure: LUMBAR FOUR TO FIVE LUMBAR LAMINECTOMY/DECOMPRESSION MICRODISCECTOMY 1 LEVEL;  Surgeon: Catalina HERO Stains, MD;  Location: [facility];  Service: Neurosurgery;  Laterality: Right;  Right L45 diskectomy   MALONEY DILATION [name] 06/16/2020   Procedure: AGAPITO DILATION;  Surgeon: Shaaron Lamar HERO, MD;  Location: [facility];  Service: Endoscopy;  Laterality: [name];   MALONEY DILATION [name] 02/10/2022   Procedure: AGAPITO DILATION;  Surgeon: Shaaron Lamar HERO, MD;  Location: [facility];  Service: Endoscopy;  Laterality: [name];   Family History:  Family History  Problem Relation Age of Onset   COPD Father    Anxiety disorder Maternal Grandmother    Depression Maternal Grandmother    Colon cancer Neg Hx    Colon polyps Neg Hx    Family Psychiatric  History:  Social History:  Social History   Substance and Sexual Activity  Alcohol  Use No     Social History   Substance and Sexual Activity  Drug Use Never    Social History   Socioeconomic History   Marital status: Married    Spouse name: Not on file   Number of children: 0   Years of education: college   Highest education level: Not on file  Occupational History   Occupation: disabled  Tobacco Use   Smoking status: Never   Smokeless tobacco: Never  Vaping Use   Vaping status: Never Used  Substance and Sexual Activity   Alcohol  use: No   Drug use: Never   Sexual activity: Yes    Partners: Male    Birth control/protection: None  Other Topics Concern   Not on file  Social History Narrative   Patient lives at home alone    Patient is right handed   Patient drinks soda's daily   Social Drivers  of Corporate investment banker Strain: Not on file  Food Insecurity: No Food Insecurity (01/27/2023)   Hunger Vital Sign    Worried About Running Out of Food in the Last Year: Never true    Ran Out of Food in the Last Year: Never true  Transportation Needs: No Transportation Needs (01/27/2023)   PRAPARE - Administrator, Civil Service (Medical): No    Lack of Transportation (Non-Medical): No  Physical Activity: Not on file  Stress: Not on file  Social Connections: Not on file   Additional Social History:                         Sleep: Fair  Appetite:  Good  Current Medications: Current Outpatient Medications  Medication Sig Dispense Refill   ACCU-CHEK GUIDE test strip      albuterol  (VENTOLIN  HFA) 108 (90 Base) MCG/ACT inhaler Inhale 2 puffs into the lungs every 4 (four) hours as needed for wheezing or shortness of breath. 1 each 3   amLODipine  (NORVASC ) 5 MG tablet Take 1 tablet (5 mg total) by mouth daily. 90 tablet 0   Continuous Glucose Sensor (DEXCOM G7 SENSOR) MISC USE AS DIRECTED AND CHANGE EVERY 10 DAYS 9 each 3   dicyclomine  (BENTYL ) 10 MG capsule Take 1 capsule (10 mg total) by mouth 2 (two) times daily as needed for spasms. 30 capsule 0   fenofibrate  micronized (LOFIBRA) 134 MG capsule TAKE ONE CAPSULE BY MOUTH DAILY 90 capsule 2   fluticasone  (FLONASE) 50 MCG/ACT nasal spray Place 1 spray into both nostrils at bedtime as needed for allergies or rhinitis.      furosemide  (LASIX ) 20 MG tablet TAKE 1 TABLET BY MOUTH DAILY 30 tablet 5   gabapentin  (NEURONTIN ) 600 MG tablet Take 600 mg by mouth 2 (two) times daily.     Insulin  Pen Needle (CARETOUCH PEN NEEDLES) 31G X 6 MM MISC Use to inject insulin  3 times daily 300 each 3   insulin  regular human CONCENTRATED (HUMULIN  R U-500 KWIKPEN) 500 UNIT/ML KwikPen Inject 80 Units into the skin 3 (three) times daily with meals. 45 mL 0   metFORMIN  (GLUCOPHAGE -XR) 500 MG 24 hr tablet Take 1 tablet (500 mg total) by  mouth daily with breakfast. 90 tablet 3   metoprolol  tartrate (LOPRESSOR ) 25 MG tablet TAKE 1 TABLET BY MOUTH TWICE DAILY 180 tablet 1   Multiple Vitamins-Minerals (MULTIVITAMIN WITH MINERALS) tablet Take 1 tablet by mouth daily.     nitroGLYCERIN  (NITROSTAT ) 0.4 MG SL tablet Place 1 tablet (0.4 mg total) under the tongue every 5 (five) minutes x 3 doses as needed (if no relief after 3rd dose proceed to ED or call 911). 25 tablet 2   NON FORMULARY Pt uses a cpap nightly     omega-3 acid ethyl esters (LOVAZA ) 1 g capsule Take 2 g by mouth 2 (two) times daily.     omeprazole  (PRILOSEC) 40 MG capsule TAKE ONE CAPSULE BY MOUTH DAILY 30 capsule 11   ondansetron  (ZOFRAN -ODT) 4 MG disintegrating tablet TAKE 1 TABLET BY MOUTH EVERY 8 HOURS AS NEEDED FOR NAUSEA AND VOMITING 90 tablet 3   Pancrelipase , Lip-Prot-Amyl, (ZENPEP ) 40000-126000 units CPEP TAKE TWO CAPSULES BY MOUTH THREE TIMES DAILY WITH MEALS AND TAKE 1 CAPSULE with AT least 2 snacks 270 capsule 3   ranolazine  (RANEXA ) 500 MG 12 hr tablet Take 1 tablet (500 mg total) by mouth 2 (two) times daily. 180 tablet 1   rosuvastatin  (CRESTOR ) 10 MG tablet TAKE 1 TABLET BY MOUTH AT BEDTIME 90 tablet 0   valsartan  (DIOVAN ) 80 MG tablet Take 1 tablet (80 mg total) by mouth daily. 30 tablet 11   ARIPiprazole  (ABILIFY ) 20 MG tablet Take 1 tablet (20 mg total) by mouth daily. 90 tablet 1   clonazePAM  (KLONOPIN ) 1 MG tablet Take 1 mg in the morning and 2 mg at night 90 tablet 4   Dextromethorphan-buPROPion  ER (AUVELITY ) 45-105 MG TBCR 1 qam for 1 week then 1 bid 60 tablet 3   DULoxetine  (CYMBALTA ) 60 MG capsule Take 2 capsules (120 mg total) by mouth at bedtime. 60 capsule 4   traZODone  (DESYREL ) 100 MG tablet Take 4 tablets (400 mg total) by mouth at bedtime.  360 tablet 1   No current facility-administered medications for this visit.    Lab Results: No results found for this or any previous visit (from the past 48 hours).  Physical Findings: AIMS:  , ,  ,   ,    CIWA:    COWS:     Musculoskeletal: Strength & Muscle Tone: within normal limits Gait & Station: normal Patient leans: Right  Psychiatric Specialty Exam: ROS  Blood pressure 136/86, pulse (!) 105, height 5' 2 (1.575 m), weight 236 lb (107 kg).Body mass index is 43.16 kg/m.  General Appearance: Casual  Eye Contact::  Good  Speech:  Clear and Coherent  Volume:  Normal  Mood:Depressed  Affect:  Appropriate  Today the patient is doing very well. Thought Process:  Coherent  Orientation:  Full (Time, Place, and Person)  Thought Content:  NA and WDL  Suicidal Thoughts:  yes  Homicidal Thoughts:  No  Memory:  Immediate;   NA  Judgement:  Good  Insight:  Good  Psychomotor Activity:  NA  Concentration:  Good  Recall:  Good  Fund of Knowledge:Good  Language: Good  Akathisia:  No  Handed:  Right  AIMS (if indicated):     Assets:  Desire for Improvement  ADL's:  Intact  Cognition: WNL  Sleep:      Treatment Plan Summary: 04/18/2024, 3:38 PM   This patient's diagnosis is major depression in remission.  The patient takes Auvelity  twice daily as well as Abilify  20 mg.  Patient also takes Cymbalta .  Patient's second problem is that of insomnia.  She takes trazodone  which works well for her.  The patient also has an adjustment disorder and she takes some Klonopin  for this.  Patient continues in one-to-one therapy.  The patient is very stable at this time.  She will return to see me in 3 months.

## 2024-05-01 ENCOUNTER — Ambulatory Visit: Admitting: Gastroenterology

## 2024-05-02 DIAGNOSIS — M25511 Pain in right shoulder: Secondary | ICD-10-CM | POA: Diagnosis not present

## 2024-05-02 DIAGNOSIS — M25521 Pain in right elbow: Secondary | ICD-10-CM | POA: Diagnosis not present

## 2024-05-03 ENCOUNTER — Other Ambulatory Visit: Payer: Self-pay | Admitting: Nurse Practitioner

## 2024-05-03 DIAGNOSIS — R079 Chest pain, unspecified: Secondary | ICD-10-CM

## 2024-05-03 DIAGNOSIS — I25119 Atherosclerotic heart disease of native coronary artery with unspecified angina pectoris: Secondary | ICD-10-CM

## 2024-05-07 DIAGNOSIS — R200 Anesthesia of skin: Secondary | ICD-10-CM | POA: Diagnosis not present

## 2024-05-07 DIAGNOSIS — M75101 Unspecified rotator cuff tear or rupture of right shoulder, not specified as traumatic: Secondary | ICD-10-CM | POA: Diagnosis not present

## 2024-05-07 DIAGNOSIS — R202 Paresthesia of skin: Secondary | ICD-10-CM | POA: Diagnosis not present

## 2024-05-07 DIAGNOSIS — M25531 Pain in right wrist: Secondary | ICD-10-CM | POA: Diagnosis not present

## 2024-05-07 DIAGNOSIS — M542 Cervicalgia: Secondary | ICD-10-CM | POA: Diagnosis not present

## 2024-05-24 ENCOUNTER — Other Ambulatory Visit: Payer: Self-pay | Admitting: Internal Medicine

## 2024-05-24 DIAGNOSIS — I10 Essential (primary) hypertension: Secondary | ICD-10-CM

## 2024-05-30 NOTE — Progress Notes (Signed)
 Jamie [name]                                          MRN: [id number]   05/30/2024   The [facility] Quality Team Specialist reviewed this patient medical record for the purposes of chart review for care gap closure. The following were reviewed: abstraction for care gap closure-kidney health evaluation for diabetes:uACR.    [facility] Quality Team

## 2024-06-05 ENCOUNTER — Ambulatory Visit: Admitting: Gastroenterology

## 2024-06-05 ENCOUNTER — Encounter: Payer: Self-pay | Admitting: Gastroenterology

## 2024-06-05 VITALS — BP 122/74 | HR 102 | Temp 97.6°F | Ht 63.0 in | Wt 241.2 lb

## 2024-06-05 DIAGNOSIS — G8929 Other chronic pain: Secondary | ICD-10-CM

## 2024-06-05 DIAGNOSIS — R110 Nausea: Secondary | ICD-10-CM

## 2024-06-05 DIAGNOSIS — K219 Gastro-esophageal reflux disease without esophagitis: Secondary | ICD-10-CM

## 2024-06-05 DIAGNOSIS — K8681 Exocrine pancreatic insufficiency: Secondary | ICD-10-CM

## 2024-06-05 DIAGNOSIS — K529 Noninfective gastroenteritis and colitis, unspecified: Secondary | ICD-10-CM

## 2024-06-05 DIAGNOSIS — R1013 Epigastric pain: Secondary | ICD-10-CM | POA: Diagnosis not present

## 2024-06-05 DIAGNOSIS — Z8719 Personal history of other diseases of the digestive system: Secondary | ICD-10-CM

## 2024-06-05 DIAGNOSIS — R7989 Other specified abnormal findings of blood chemistry: Secondary | ICD-10-CM

## 2024-06-05 MED ORDER — ZENPEP 40000-126000 UNITS PO CPEP: ORAL_CAPSULE | ORAL | 3 refills | Status: AC

## 2024-06-05 MED ORDER — DEXLANSOPRAZOLE 60 MG PO CPDR: 60.0000 mg | DELAYED_RELEASE_CAPSULE | Freq: Every day | ORAL | 2 refills | Status: AC

## 2024-06-05 MED ORDER — ONDANSETRON 4 MG PO TBDP: ORAL_TABLET | ORAL | 3 refills | Status: AC

## 2024-06-05 NOTE — Progress Notes (Signed)
 GI Office Note    Referring Provider: Jolee Greig VEAR DEVONNA Primary Care Physician:  Jolee Greig VEAR DEVONNA Primary Gastroenterologist: Lamar HERO.Rourk, MD Date:  06/05/2024  ID:  Jamie [name], DOB 19-Dec-1970, MRN [id number]   Chief Complaint   Chief Complaint  Patient presents with   Follow-up    Follow up. Still having some nausea    History of Present Illness  Jamie [name] is a 53 y.o. female with a history of GERD, anxiety, HLD, HTN, migraines, PCOS, bipolar 1, depression, and pancreatitis maintained on enzyme replacement presenting today with complaint of some occasional nausea.  EGD November 2021 - normal esophagus s/p dilation, normal stomach, normal duodenum.    EGD July 2023: -Normal esophagus s/p dilation -Normal stomach -Normal duodenum   Previous workup with fecal elastase in the 435 range.   Last office visit 05/24/23 with Dr. Shaaron.  Presented for recurrent epigastric and left lower quadrant abdominal pain.  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  Her most recent labs have been triglycerides less than thousand.  Prior evaluation negative for biliary etiology although slight bump in alk phos previously.  Denied jaundice, fever, or chills.  Continued on pancreatic enzyme supplements and PPI twice daily.  No further dysphagia since dilation.  IgG4 negative for autoimmune pancreatitis.  UA, CBC, lipase, HFP ordered.  Consider CT scan in the future for evaluation of her abdominal pain.  Advised to stay on low residue diet.   UA October with trace ketones and 1+ leukocytes.  Results forwarded to PCP.  Lipase 38, HFP within normal limits.  CT A/P ordered.   CT A/P with contrast 09/19/2023: -Decreasing size of 2 small complex fluid collections within the gastrosplenic ligament consistent with known pancreatic pseudocyst -Hepatic steatosis -Evidence of cholecystectomy without biliary  dilation -Normal stomach, colonic diverticulosis without diverticulitis  Last office visit 10/27/2023.  Left lower quadrant pain coming and going occurring about every other day.  Denies any relationship with food bowel movements, throbbing usually.  Having good blood pressures, blood glucose has been in the 200s which is improved from 400s.  He does make pain better and walking makes it worse.  Does admit to endometriosis and PCOS.  Having some nausea that can be midday, reflux pretty well-controlled with once daily omeprazole .  Taking ibuprofen for her back pain 600 mg as needed.  Not taking dicyclomine .  Advise GYN evaluation.  CTA to rule out ischemia.  Repeat colonoscopy if negative.  Dicyclomine  as needed continue Zenpep .  Also continue omeprazole  40 mg once daily.  CT angio abdomen pelvis April 1025: - Negative for AAA - No evidence of mesenteric ischemia - Similar appearance of hepatic steatosis and pancreatic pseudocyst - Degenerative lumbar changes.  Recent A1c in August 2025 was 11.8.  Today:  Discussed the use of AI scribe software for clinical note transcription with the patient, who gave verbal consent to proceed.  Approximately two months ago, she experienced a pancreatic attack that did not require hospitalization. Symptoms were managed with soft foods and hydration, and there was no fever during this episode. She reports upper abdominal pain associated with episodes she describes as pancreatic flares. No diarrhea, lower abdominal pain, or blood in stool. No melena.   She has a history of diabetes with an A1c of 11.8% as of August. Her blood sugars have been running between 300 to 400 mg/dL at home. She has been on Humulin , a concentrated  insulin , for the past four months. She acknowledges room for improvement in her diet, avoiding sweets and sugary drinks, and primarily consuming water . She tries to eat grilled or baked chicken and limits bread intake to sandwiches. She enjoys  apples as her fruit of choice.  She experiences chronic nausea, which she attributes to a recent pancreatic flare-up. The nausea occurs two to three times a week, lasting most of the day, and is sometimes relieved by lying down with a fan blowing on her face. She takes Zofran  as needed for severe nausea but not daily.  She reports occasional reflux symptoms, particularly after consuming foods with tomato or ketchup, and takes omeprazole  for management. She experiences occasional burning sensations in her throat, occurring three to four times a week, and takes omeprazole  for this. She has tried Prilosec, pantoprazole , and Nexium in the past. She is currently taking Zenpep  with meals and has not experienced any issues with diarrhea. No significant changes in her medication regimen or diet recently.      Wt Readings from Last 6 Encounters:  06/05/24 241 lb 3.2 oz (109.4 kg)  03/01/24 234 lb 9.6 oz (106.4 kg)  02/10/24 233 lb 12.8 oz (106.1 kg)  11/11/23 227 lb 9.6 oz (103.2 kg)  11/04/23 227 lb 6.4 oz (103.1 kg)  10/31/23 210 lb 12.8 oz (95.6 kg)   Body mass index is 42.73 kg/m.  Current Outpatient Medications  Medication Sig Dispense Refill   ACCU-CHEK GUIDE test strip      albuterol  (VENTOLIN  HFA) 108 (90 Base) MCG/ACT inhaler Inhale 2 puffs into the lungs every 4 (four) hours as needed for wheezing or shortness of breath. 1 each 3   amLODipine  (NORVASC ) 5 MG tablet Take 1 tablet (5 mg total) by mouth daily. 90 tablet 0   ARIPiprazole  (ABILIFY ) 20 MG tablet Take 1 tablet (20 mg total) by mouth daily. 90 tablet 1   clonazePAM  (KLONOPIN ) 1 MG tablet Take 1 mg in the morning and 2 mg at night 90 tablet 4   Continuous Glucose Sensor (DEXCOM G7 SENSOR) MISC USE AS DIRECTED AND CHANGE EVERY 10 DAYS 9 each 3   Dextromethorphan-buPROPion  ER (AUVELITY ) 45-105 MG TBCR 1 qam for 1 week then 1 bid 60 tablet 3   dicyclomine  (BENTYL ) 10 MG capsule Take 1 capsule (10 mg total) by mouth 2 (two) times daily  as needed for spasms. 30 capsule 0   DULoxetine  (CYMBALTA ) 60 MG capsule Take 2 capsules (120 mg total) by mouth at bedtime. 60 capsule 4   fenofibrate  micronized (LOFIBRA) 134 MG capsule TAKE ONE CAPSULE BY MOUTH DAILY 90 capsule 2   fluticasone  (FLONASE) 50 MCG/ACT nasal spray Place 1 spray into both nostrils at bedtime as needed for allergies or rhinitis.      furosemide  (LASIX ) 20 MG tablet TAKE 1 TABLET BY MOUTH DAILY 90 tablet 2   gabapentin  (NEURONTIN ) 600 MG tablet Take 600 mg by mouth 2 (two) times daily.     Insulin  Pen Needle (CARETOUCH PEN NEEDLES) 31G X 6 MM MISC Use to inject insulin  3 times daily 300 each 3   insulin  regular human CONCENTRATED (HUMULIN  R U-500 KWIKPEN) 500 UNIT/ML KwikPen Inject 80 Units into the skin 3 (three) times daily with meals. 45 mL 0   metFORMIN  (GLUCOPHAGE -XR) 500 MG 24 hr tablet Take 1 tablet (500 mg total) by mouth daily with breakfast. 90 tablet 3   metoprolol  tartrate (LOPRESSOR ) 25 MG tablet TAKE 1 TABLET BY MOUTH TWICE DAILY 180 tablet  1   Multiple Vitamins-Minerals (MULTIVITAMIN WITH MINERALS) tablet Take 1 tablet by mouth daily.     nitroGLYCERIN  (NITROSTAT ) 0.4 MG SL tablet Place 1 tablet (0.4 mg total) under the tongue every 5 (five) minutes x 3 doses as needed (if no relief after 3rd dose proceed to ED or call 911). 25 tablet 2   NON FORMULARY Pt uses a cpap nightly     omega-3 acid ethyl esters (LOVAZA ) 1 g capsule Take 2 g by mouth 2 (two) times daily.     omeprazole  (PRILOSEC) 40 MG capsule TAKE ONE CAPSULE BY MOUTH DAILY 30 capsule 11   ondansetron  (ZOFRAN -ODT) 4 MG disintegrating tablet TAKE 1 TABLET BY MOUTH EVERY 8 HOURS AS NEEDED FOR NAUSEA AND VOMITING 90 tablet 3   Pancrelipase , Lip-Prot-Amyl, (ZENPEP ) 40000-126000 units CPEP TAKE TWO CAPSULES BY MOUTH THREE TIMES DAILY WITH MEALS AND TAKE 1 CAPSULE with AT least 2 snacks 270 capsule 3   ranolazine  (RANEXA ) 500 MG 12 hr tablet Take 1 tablet (500 mg total) by mouth 2 (two) times daily.  180 tablet 1   rosuvastatin  (CRESTOR ) 10 MG tablet TAKE 1 TABLET BY MOUTH AT BEDTIME 90 tablet 0   traZODone  (DESYREL ) 100 MG tablet Take 4 tablets (400 mg total) by mouth at bedtime. 360 tablet 1   valsartan  (DIOVAN ) 80 MG tablet TAKE 1 TABLET BY MOUTH DAILY 30 tablet 2   No current facility-administered medications for this visit.    Past Medical History:  Diagnosis Date   Anxiety    Arthritis    knees   Bipolar 1 disorder (HCC)    Chronic diarrhea    Diverticulosis of colon    GERD (gastroesophageal reflux disease)    Hiatal hernia    History of concussion    per pt 11/ 2020 w/ no residual   Hyperlipidemia    Hypertension    followed by pcp   (11-29-2019  per pt stated never had stress test)   MDD (major depressive disorder)    Migraine    neurologist--- dr onita   OSA on CPAP    followed by dr dohmeier   PCOS (polycystic ovarian syndrome)    PONV (postoperative nausea and vomiting)    Type 2 diabetes mellitus (HCC)    followed by pcp   (11-29-2019  pt stated checks blood sugar daily in am,  fasting sugar--- 96    Past Surgical History:  Procedure Laterality Date   ANAL FISSURE REPAIR  x3  last one  2007  approx.   BIOPSY  09/06/2019   Procedure: BIOPSY;  Surgeon: Shaaron Lamar HERO, MD;  Location: [facility];  Service: Endoscopy;;   COLONOSCOPY WITH PROPOFOL  [name] 09/06/2019   pancolonic diverticulosis. Segmental biopsy completed with benign biopsies.    ESOPHAGOGASTRODUODENOSCOPY (EGD) WITH PROPOFOL  [name] 06/16/2020   Dr. Shaaron: normal esophagus s/p dilation, normal stomach, duodenum.    ESOPHAGOGASTRODUODENOSCOPY (EGD) WITH PROPOFOL  [name] 02/10/2022   Procedure: ESOPHAGOGASTRODUODENOSCOPY (EGD) WITH PROPOFOL ;  Surgeon: Shaaron Lamar HERO, MD;  Location: [facility];  Service: Endoscopy;  Laterality: [name];  9:45am   EVALUATION UNDER ANESTHESIA WITH HEMORRHOIDECTOMY [name] 12/05/2019   Procedure: ANORECTAL EXAM UNDER ANESTHESIA WITH HEMORRHOIDECTOMY, HEMORRHOIDAL LIGATION/PEXY;   Surgeon: Sheldon Standing, MD;  Location: [facility];  Service: General;  Laterality: [name];  GENERAL AND LOCAL   EVALUATION UNDER ANESTHESIA WITH TEAR DUCT PROBING  infant   KNEE ARTHROSCOPY Right 1994   LAPAROSCOPIC CHOLECYSTECTOMY  08-06-2002  @[facility]    LEFT HEART CATH  AND CORONARY ANGIOGRAPHY [name] 10/03/2023   Procedure: LEFT HEART CATH AND CORONARY ANGIOGRAPHY;  Surgeon: Ladona Heinz, MD;  Location: [facility];  Service: Cardiovascular;  Laterality: [name];   LUMBAR LAMINECTOMY/DECOMPRESSION MICRODISCECTOMY Right 01/22/2014   Procedure: LUMBAR FOUR TO FIVE LUMBAR LAMINECTOMY/DECOMPRESSION MICRODISCECTOMY 1 LEVEL;  Surgeon: Catalina CHRISTELLA Stains, MD;  Location: [facility];  Service: Neurosurgery;  Laterality: Right;  Right L45 diskectomy   MALONEY DILATION [name] 06/16/2020   Procedure: AGAPITO DILATION;  Surgeon: Shaaron Lamar CHRISTELLA, MD;  Location: [facility];  Service: Endoscopy;  Laterality: [name];   MALONEY DILATION [name] 02/10/2022   Procedure: AGAPITO DILATION;  Surgeon: Shaaron Lamar CHRISTELLA, MD;  Location: [facility];  Service: Endoscopy;  Laterality: [name];    Family History  Problem Relation Age of Onset   COPD Father    Anxiety disorder Maternal Grandmother    Depression Maternal Grandmother    Colon cancer Neg Hx    Colon polyps Neg Hx     Allergies as of 06/05/2024 - Review Complete 06/05/2024  Allergen Reaction Noted   Codeine Hives and Swelling 06/07/2011   Ozempic (0.25 or 0.5 mg-dose) [semaglutide(0.25 or 0.5mg -dos)]  03/02/2023   Morphine  and codeine Swelling and Rash 01/21/2014    Social History   Socioeconomic History   Marital status: Married    Spouse name: Not on file   Number of children: 0   Years of education: college   Highest education level: Not on file  Occupational History   Occupation: disabled  Tobacco Use   Smoking status: Never   Smokeless tobacco: Never  Vaping Use   Vaping status: Never Used  Substance and Sexual Activity   Alcohol  use: No    Drug use: Never   Sexual activity: Yes    Partners: Male    Birth control/protection: None  Other Topics Concern   Not on file  Social History Narrative   Patient lives at home alone    Patient is right handed   Patient drinks soda's daily   Social Drivers of Corporate Investment Banker Strain: Not on file  Food Insecurity: No Food Insecurity (01/27/2023)   Hunger Vital Sign    Worried About Running Out of Food in the Last Year: Never true    Ran Out of Food in the Last Year: Never true  Transportation Needs: No Transportation Needs (01/27/2023)   PRAPARE - Administrator, Civil Service (Medical): No    Lack of Transportation (Non-Medical): No  Physical Activity: Not on file  Stress: Not on file  Social Connections: Not on file    Review of Systems   Gen: + weight gain. Denies fever, chills, anorexia. Denies fatigue, weakness, weight loss.  CV: + chest burning. Denies chest pain, palpitations, syncope, peripheral edema, and claudication. Resp: Denies dyspnea at rest, cough, wheezing, coughing up blood, and pleurisy. GI: See HPI Psych: Denies depression, anxiety, memory loss, confusion. No homicidal or suicidal ideation.  Heme: Denies bruising, bleeding, and enlarged lymph nodes.  Physical Exam   BP 122/74 (BP Location: Right Arm, Patient Position: Sitting, Cuff Size: Large)   Pulse (!) 102   Temp 97.6 F (36.4 C) (Temporal)   Ht 5' 3 (1.6 m)   Wt 241 lb 3.2 oz (109.4 kg)   LMP  (LMP Unknown)   BMI 42.73 kg/m   General:   Alert and oriented. No distress noted. Pleasant and cooperative.  Head:  Normocephalic and atraumatic. Eyes:  Conjuctiva clear  without scleral icterus. Mouth:  Oral mucosa pink and moist.  Missing teeth. No lesions.  Abdomen:  +BS, soft, mild distention.  Mild TTP to epigastrium.  No rebound or guarding.  Unable to assess for HSM Rectal: deferred Msk:  Symmetrical without gross deformities. Normal posture. Extremities:  Without  edema. Neurologic:  Alert and  oriented x4 Psych:  Alert and cooperative. Normal mood and affect.  Assessment & Plan  Jamie [name] is a 53 y.o. female presenting today with complaints of intermittent nausea and chest burning.    Gastroesophageal reflux disease with recurrent epigastric pain Recurrent epigastric pain likely related to GERD. Symptoms include burning sensation in the throat occurring 3-4 times a week, possibly related to reflux. Current management with omeprazole  may be losing efficacy. No significant reflux symptoms outside of these episodes. No diarrhea or constipation reported. - Prescribed Dexilant as a trial for GERD management. If insurance denies, will consider lansoprazole. - Continue omeprazole  until Dexilant is approved. - If symptoms worsen will consider alternative PPI or trial of Voquezna.   Recurrent pancreatitis and exocrine pancreatic insufficiency Recent flare-up of pancreatitis approximately two months ago, managed with dietary modifications and hydration. No hospitalization required. Continues to take Zenpep  with meals without issues of diarrhea. No blood in stool or melena reported. - Continue Zenpep  2 with meals and 1 with snacks. - Monitor for severe recurrent symptoms, discussed ED precautions  Chronic nausea (possible gastroparesis) likely secondary to Type 2 diabetes mellitus with poor glycemic control Chronic nausea occurring 2-3 times a week, lasting about a day, likely related to poor glycemic control and possible mild gastroparesis. Symptoms are intermittent and relieved by rest and hydration as well as Zofran  as needed. No daily use of antiemetics. No significant weight loss or severe symptoms warranting further investigation at this time. - Continue Zofran  as needed for nausea. - Monitor blood sugar levels and aim for better glycemic control to potentially reduce nausea. - If nausea becomes more frequent could consider GES or empiric Reglan .       Follow up   Follow up 6 months.   Charmaine Melia, MSN, FNP-BC, AGACNP-BC [facility]

## 2024-06-05 NOTE — Patient Instructions (Addendum)
 I have sent in Dexilant 60 mg once daily for you.  This will be in place of your omeprazole  once you get the medication.  This is to better help treat the burning in your chest and throat to help better treat your reflux.   As we discussed if there is an issue with the Dexilant and it is denied then we will send another alternative as required.  Continue to work on better control of your blood sugars and continuing to follow with endocrinology as I suspect this will more likely help your nausea that you have been having.  In the grand scheme of things as well would also help prevent further complications from your pancreatitis.  Continue Zenpep  2 capsules with meals 3 times a day and 1 with snacks.  Please let me know if you develop any constipation, severe diarrhea, or severe abdominal pain.  If you feel you are having a flare of pancreatitis then you should briefly decreased to a liquid diet and slowly advance your diet as you are tolerated with focusing on staying hydrated but if symptoms become more severe please proceed to the ED.  Follow-up in 6 months, sooner if needed.  It was a pleasure to see you today. I want to create trusting relationships with patients. If you receive a survey regarding your visit,  I greatly appreciate you taking time to fill this out on paper or through your MyChart. I value your feedback.  Charmaine Melia, MSN, FNP-BC, AGACNP-BC [facility]

## 2024-06-06 ENCOUNTER — Ambulatory Visit: Admitting: Nurse Practitioner

## 2024-06-06 ENCOUNTER — Other Ambulatory Visit: Payer: Self-pay | Admitting: Nurse Practitioner

## 2024-06-06 DIAGNOSIS — R079 Chest pain, unspecified: Secondary | ICD-10-CM

## 2024-06-06 DIAGNOSIS — Z794 Long term (current) use of insulin: Secondary | ICD-10-CM

## 2024-06-06 DIAGNOSIS — E782 Mixed hyperlipidemia: Secondary | ICD-10-CM

## 2024-06-06 DIAGNOSIS — Z7984 Long term (current) use of oral hypoglycemic drugs: Secondary | ICD-10-CM

## 2024-06-06 DIAGNOSIS — I25119 Atherosclerotic heart disease of native coronary artery with unspecified angina pectoris: Secondary | ICD-10-CM

## 2024-06-06 DIAGNOSIS — I152 Hypertension secondary to endocrine disorders: Secondary | ICD-10-CM

## 2024-06-06 MED ORDER — RANOLAZINE ER 500 MG PO TB12: 500.0000 mg | ORAL_TABLET | Freq: Two times a day (BID) | ORAL | 2 refills | Status: AC

## 2024-06-23 ENCOUNTER — Other Ambulatory Visit: Payer: Self-pay | Admitting: Internal Medicine

## 2024-07-03 ENCOUNTER — Other Ambulatory Visit: Payer: Self-pay | Admitting: Internal Medicine

## 2024-07-11 ENCOUNTER — Ambulatory Visit (HOSPITAL_COMMUNITY): Admitting: Psychiatry

## 2024-07-25 ENCOUNTER — Ambulatory Visit (HOSPITAL_COMMUNITY): Admitting: Psychiatry

## 2024-07-25 ENCOUNTER — Ambulatory Visit: Admitting: Nurse Practitioner

## 2024-07-25 DIAGNOSIS — Z7984 Long term (current) use of oral hypoglycemic drugs: Secondary | ICD-10-CM

## 2024-07-25 DIAGNOSIS — E1165 Type 2 diabetes mellitus with hyperglycemia: Secondary | ICD-10-CM

## 2024-07-25 DIAGNOSIS — E782 Mixed hyperlipidemia: Secondary | ICD-10-CM

## 2024-07-25 DIAGNOSIS — I152 Hypertension secondary to endocrine disorders: Secondary | ICD-10-CM

## 2024-07-25 DIAGNOSIS — Z794 Long term (current) use of insulin: Secondary | ICD-10-CM

## 2024-07-30 ENCOUNTER — Ambulatory Visit: Admitting: Nurse Practitioner

## 2024-07-30 ENCOUNTER — Encounter: Payer: Self-pay | Admitting: Nurse Practitioner

## 2024-07-30 VITALS — BP 112/80 | HR 84 | Ht 63.0 in | Wt 234.8 lb

## 2024-07-30 DIAGNOSIS — Z794 Long term (current) use of insulin: Secondary | ICD-10-CM | POA: Diagnosis not present

## 2024-07-30 DIAGNOSIS — E782 Mixed hyperlipidemia: Secondary | ICD-10-CM | POA: Diagnosis not present

## 2024-07-30 DIAGNOSIS — I152 Hypertension secondary to endocrine disorders: Secondary | ICD-10-CM

## 2024-07-30 DIAGNOSIS — Z7984 Long term (current) use of oral hypoglycemic drugs: Secondary | ICD-10-CM | POA: Diagnosis not present

## 2024-07-30 DIAGNOSIS — E1165 Type 2 diabetes mellitus with hyperglycemia: Secondary | ICD-10-CM | POA: Diagnosis not present

## 2024-07-30 LAB — POCT GLYCOSYLATED HEMOGLOBIN (HGB A1C): Hemoglobin A1C: 12.3 % — AB (ref 4.0–5.6)

## 2024-07-30 MED ORDER — CARETOUCH PEN NEEDLES 31G X 6 MM MISC: 3 refills | Status: AC

## 2024-07-30 MED ORDER — DEXCOM G7 SENSOR MISC: 3 refills | Status: AC

## 2024-07-30 MED ORDER — METFORMIN HCL ER 500 MG PO TB24: 500.0000 mg | ORAL_TABLET | Freq: Every day | ORAL | 3 refills | Status: AC

## 2024-07-30 MED ORDER — HUMULIN R U-500 KWIKPEN 500 UNIT/ML ~~LOC~~ SOPN: 90.0000 [IU] | PEN_INJECTOR | Freq: Three times a day (TID) | SUBCUTANEOUS | 3 refills | Status: AC

## 2024-07-30 NOTE — Progress Notes (Signed)
 "                                                                        Endocrinology Follow Up Note       07/30/2024, 2:36 PM   Subjective:    Patient ID: Jamie [name], female    DOB: [birth year].  Jamie [name] is being seen in follow up after being seen in consultation for management of currently uncontrolled symptomatic diabetes requested by  Jamie [name] DEL, PA-C.   Past Medical History:  Diagnosis Date   Anxiety    Arthritis    knees   Bipolar 1 disorder (HCC)    Chronic diarrhea    Diverticulosis of colon    GERD (gastroesophageal reflux disease)    Hiatal hernia    History of concussion    per pt 11/ 2020 w/ no residual   Hyperlipidemia    Hypertension    followed by pcp   (11-29-2019  per pt stated never had stress test)   MDD (major depressive disorder)    Migraine    neurologist--- dr onita   OSA on CPAP    followed by dr dohmeier   PCOS (polycystic ovarian syndrome)    PONV (postoperative nausea and vomiting)    Type 2 diabetes mellitus (HCC)    followed by pcp   (11-29-2019  pt stated checks blood sugar daily in am,  fasting sugar--- 96    Past Surgical History:  Procedure Laterality Date   ANAL FISSURE REPAIR  x3  last one  2007  approx.   BIOPSY  09/06/2019   Procedure: BIOPSY;  Surgeon: Shaaron Lamar HERO, MD;  Location: [facility];  Service: Endoscopy;;   COLONOSCOPY WITH PROPOFOL  [name] 09/06/2019   pancolonic diverticulosis. Segmental biopsy completed with benign biopsies.    ESOPHAGOGASTRODUODENOSCOPY (EGD) WITH PROPOFOL  [name] 06/16/2020   Dr. Shaaron: normal esophagus s/p dilation, normal stomach, duodenum.    ESOPHAGOGASTRODUODENOSCOPY (EGD) WITH PROPOFOL  [name] 02/10/2022   Procedure: ESOPHAGOGASTRODUODENOSCOPY (EGD) WITH PROPOFOL ;  Surgeon: Shaaron Lamar HERO, MD;  Location: [facility];  Service: Endoscopy;  Laterality: [name];  9:45am   EVALUATION UNDER ANESTHESIA WITH HEMORRHOIDECTOMY [name] 12/05/2019   Procedure: ANORECTAL EXAM UNDER ANESTHESIA WITH  HEMORRHOIDECTOMY, HEMORRHOIDAL LIGATION/PEXY;  Surgeon: Sheldon Standing, MD;  Location: [facility];  Service: General;  Laterality: [name];  GENERAL AND LOCAL   EVALUATION UNDER ANESTHESIA WITH TEAR DUCT PROBING  infant   KNEE ARTHROSCOPY Right 1994   LAPAROSCOPIC CHOLECYSTECTOMY  08-06-2002  @[facility]    LEFT HEART CATH AND CORONARY ANGIOGRAPHY [name] 10/03/2023   Procedure: LEFT HEART CATH AND CORONARY ANGIOGRAPHY;  Surgeon: Ladona Heinz, MD;  Location: [facility];  Service: Cardiovascular;  Laterality: [name];   LUMBAR LAMINECTOMY/DECOMPRESSION MICRODISCECTOMY Right 01/22/2014   Procedure: LUMBAR FOUR TO FIVE LUMBAR LAMINECTOMY/DECOMPRESSION MICRODISCECTOMY 1 LEVEL;  Surgeon: Catalina HERO Stains, MD;  Location: [facility];  Service: Neurosurgery;  Laterality: Right;  Right L45 diskectomy   MALONEY DILATION [name] 06/16/2020   Procedure: AGAPITO DILATION;  Surgeon: Shaaron Lamar HERO, MD;  Location: [facility];  Service: Endoscopy;  Laterality: [name];   MALONEY DILATION [name] 02/10/2022   Procedure: AGAPITO DILATION;  Surgeon: Shaaron Lamar HERO, MD;  Location:  [facility];  Service: Endoscopy;  Laterality: [name];    Social History   Socioeconomic History   Marital status: Married    Spouse name: Not on file   Number of children: 0   Years of education: college   Highest education level: Not on file  Occupational History   Occupation: disabled  Tobacco Use   Smoking status: Never   Smokeless tobacco: Never  Vaping Use   Vaping status: Never Used  Substance and Sexual Activity   Alcohol  use: No   Drug use: Never   Sexual activity: Yes    Partners: Male    Birth control/protection: None  Other Topics Concern   Not on file  Social History Narrative   Patient lives at home alone    Patient is right handed   Patient drinks soda's daily   Social Drivers of Health   Tobacco Use: Low Risk (07/30/2024)   Patient History    Smoking Tobacco Use: Never    Smokeless Tobacco Use: Never     Passive Exposure: Not on file  Financial Resource Strain: Not on file  Food Insecurity: No Food Insecurity (01/27/2023)   Hunger Vital Sign    Worried About Running Out of Food in the Last Year: Never true    Ran Out of Food in the Last Year: Never true  Transportation Needs: No Transportation Needs (01/27/2023)   PRAPARE - Administrator, Civil Service (Medical): No    Lack of Transportation (Non-Medical): No  Physical Activity: Not on file  Stress: Not on file  Social Connections: Not on file  Depression (PHQ2-9): High Risk (01/17/2024)   Depression (PHQ2-9)    PHQ-2 Score: 19  Alcohol  Screen: Not on file  Housing: Low Risk (01/27/2023)   Housing    Last Housing Risk Score: 0  Utilities: Not At Risk (01/27/2023)   AHC Utilities    Threatened with loss of utilities: No  Health Literacy: Low Risk (09/03/2021)   Received from [facility] Liter[facility]    How often do you need to have someone help you when you read instructions, pamphlets, or other written material from your doctor or pharmacy?: Never    Family History  Problem Relation Age of Onset   COPD Father    Anxiety disorder Maternal Grandmother    Depression Maternal Grandmother    Colon cancer Neg Hx    Colon polyps Neg Hx     Outpatient Encounter Medications as of 07/30/2024  Medication Sig   ACCU-CHEK GUIDE test strip    albuterol  (VENTOLIN  HFA) 108 (90 Base) MCG/ACT inhaler Inhale 2 puffs into the lungs every 4 (four) hours as needed for wheezing or shortness of breath.   amLODipine  (NORVASC ) 5 MG tablet Take 1 tablet (5 mg total) by mouth daily.   ARIPiprazole  (ABILIFY ) 20 MG tablet Take 1 tablet (20 mg total) by mouth daily.   clonazePAM  (KLONOPIN ) 1 MG tablet Take 1 mg in the morning and 2 mg at night   dexlansoprazole  (DEXILANT ) 60 MG capsule Take 1 capsule (60 mg total) by mouth daily.   Dextromethorphan-buPROPion  ER (AUVELITY ) 45-105 MG TBCR 1 qam for 1 week then 1 bid   dicyclomine  (BENTYL ) 10 MG  capsule Take 1 capsule (10 mg total) by mouth 2 (two) times daily as needed for spasms.   DULoxetine  (CYMBALTA ) 60 MG capsule Take 2 capsules (120 mg total) by mouth at bedtime.   fenofibrate  micronized (LOFIBRA) 134 MG capsule TAKE  ONE CAPSULE BY MOUTH DAILY   fluticasone  (FLONASE) 50 MCG/ACT nasal spray Place 1 spray into both nostrils at bedtime as needed for allergies or rhinitis.    furosemide  (LASIX ) 20 MG tablet TAKE 1 TABLET BY MOUTH DAILY   gabapentin  (NEURONTIN ) 600 MG tablet Take 600 mg by mouth 2 (two) times daily.   metoprolol  tartrate (LOPRESSOR ) 25 MG tablet TAKE 1 TABLET BY MOUTH TWICE DAILY   Multiple Vitamins-Minerals (MULTIVITAMIN WITH MINERALS) tablet Take 1 tablet by mouth daily.   nitroGLYCERIN  (NITROSTAT ) 0.4 MG SL tablet Place 1 tablet (0.4 mg total) under the tongue every 5 (five) minutes x 3 doses as needed (if no relief after 3rd dose proceed to ED or call 911).   NON FORMULARY Pt uses a cpap nightly   omega-3 acid ethyl esters (LOVAZA ) 1 g capsule Take 2 g by mouth 2 (two) times daily.   omeprazole  (PRILOSEC) 40 MG capsule TAKE ONE CAPSULE BY MOUTH DAILY   ondansetron  (ZOFRAN -ODT) 4 MG disintegrating tablet TAKE 1 TABLET BY MOUTH EVERY 8 HOURS AS NEEDED FOR NAUSEA AND VOMITING   Pancrelipase , Lip-Prot-Amyl, (ZENPEP ) 40000-126000 units CPEP TAKE TWO CAPSULES BY MOUTH THREE TIMES DAILY WITH MEALS AND TAKE 1 CAPSULE with AT least 2 snacks   ranolazine  (RANEXA ) 500 MG 12 hr tablet Take 1 tablet (500 mg total) by mouth 2 (two) times daily.   rosuvastatin  (CRESTOR ) 10 MG tablet TAKE 1 TABLET BY MOUTH AT BEDTIME   traZODone  (DESYREL ) 100 MG tablet Take 4 tablets (400 mg total) by mouth at bedtime.   valsartan  (DIOVAN ) 80 MG tablet TAKE 1 TABLET BY MOUTH DAILY   [DISCONTINUED] Continuous Glucose Sensor (DEXCOM G7 SENSOR) MISC USE AS DIRECTED AND CHANGE EVERY 10 DAYS   [DISCONTINUED] Insulin  Pen Needle (CARETOUCH PEN NEEDLES) 31G X 6 MM MISC Use to inject insulin  3 times daily    [DISCONTINUED] insulin  regular human CONCENTRATED (HUMULIN  R U-500 KWIKPEN) 500 UNIT/ML KwikPen Inject 80 Units into the skin 3 (three) times daily with meals.   [DISCONTINUED] metFORMIN  (GLUCOPHAGE -XR) 500 MG 24 hr tablet Take 1 tablet (500 mg total) by mouth daily with breakfast.   Continuous Glucose Sensor (DEXCOM G7 SENSOR) MISC USE AS DIRECTED AND CHANGE EVERY 10 DAYS   Insulin  Pen Needle (CARETOUCH PEN NEEDLES) 31G X 6 MM MISC Use to inject insulin  3 times daily   insulin  regular human CONCENTRATED (HUMULIN  R U-500 KWIKPEN) 500 UNIT/ML KwikPen Inject 90 Units into the skin 3 (three) times daily with meals.   metFORMIN  (GLUCOPHAGE -XR) 500 MG 24 hr tablet Take 1 tablet (500 mg total) by mouth daily with breakfast.   No facility-administered encounter medications on file as of 07/30/2024.    ALLERGIES: Allergies  Allergen Reactions   Codeine Hives and Swelling   Ozempic (0.25 Or 0.5 Mg-Dose) [Semaglutide(0.25 Or 0.5mg -Dos)]     pancreatitis   Morphine  And Codeine Swelling and Rash    VACCINATION STATUS: Immunization History  Administered Date(s) Administered   Influenza Inj Mdck Quad Pf 04/26/2019   Moderna Sars-Covid-2 Vaccination 11/13/2019, 12/17/2019    Diabetes She presents for her follow-up diabetic visit. She has type 2 diabetes mellitus. Onset time: diagnosed at approx age of 2. Her disease course has been worsening. There are no hypoglycemic associated symptoms. Associated symptoms include fatigue, foot paresthesias, polydipsia, polyuria and weight loss. There are no hypoglycemic complications. Diabetic complications include peripheral neuropathy. (Hx pancreatitis) Risk factors for coronary artery disease include diabetes mellitus, dyslipidemia, family history, obesity, hypertension and sedentary lifestyle. Current  diabetic treatment includes intensive insulin  program and oral agent (monotherapy). She is compliant with treatment most of the time. Her weight is decreasing  steadily. She is following a generally unhealthy diet. Meal planning includes avoidance of concentrated sweets and ADA exchanges. She has not had a previous visit with a dietitian. She participates in exercise intermittently. Her home blood glucose trend is decreasing steadily. Her breakfast blood glucose range is generally >200 mg/dl. Her lunch blood glucose range is generally >200 mg/dl. Her dinner blood glucose range is generally >200 mg/dl. Her bedtime blood glucose range is generally >200 mg/dl. Her overall blood glucose range is >200 mg/dl. (She presents today with her CGM showing gross hyperglycemia overall, however somewhat improving in recent weeks.  Her POCT A1c today is 12.3%, increasing from last visit of 11.8%.  She denies any hypoglycemia.  Analysis of her CGM shows TIR 5%, TAR 95%, TBR % with a GMI of 10%.  She was sick with the Flu last week.) An ACE inhibitor/angiotensin II receptor blocker is being taken. She does not see a podiatrist.Eye exam is current.    Review of systems  Constitutional: + stable body weight,  current Body mass index is 41.59 kg/m. , no fatigue, no subjective hyperthermia, no subjective hypothermia Eyes: no blurry vision, no xerophthalmia ENT: no sore throat, no nodules palpated in throat, no dysphagia/odynophagia, no hoarseness Cardiovascular: no chest pain, no shortness of breath, no palpitations, no leg swelling Respiratory: no cough, no shortness of breath Gastrointestinal: no nausea/vomiting/diarrhea Musculoskeletal: no muscle/joint aches Skin: no rashes, no hyperemia Neurological: no tremors, no numbness, no tingling, no dizziness Psychiatric: no depression, no anxiety  Objective:     BP 112/80 (BP Location: Right Arm, Patient Position: Sitting, Cuff Size: Large)   Pulse 84   Ht 5' 3 (1.6 m)   Wt 234 lb 12.8 oz (106.5 kg)   LMP  (LMP Unknown)   BMI 41.59 kg/m   Wt Readings from Last 3 Encounters:  07/30/24 234 lb 12.8 oz (106.5 kg)  06/05/24  241 lb 3.2 oz (109.4 kg)  03/01/24 234 lb 9.6 oz (106.4 kg)     BP Readings from Last 3 Encounters:  07/30/24 112/80  06/05/24 122/74  03/01/24 98/64     Physical Exam- Limited  Constitutional:  Body mass index is 41.59 kg/m. , not in acute distress, normal state of mind Eyes:  EOMI, no exophthalmos Musculoskeletal: no gross deformities, strength intact in all four extremities, no gross restriction of joint movements Skin:  no rashes, no hyperemia Neurological: no tremor with outstretched hands   Diabetic Foot Exam - Simple   No data filed     CMP ( most recent) CMP     Component Value Date/Time   NA 134 (L) 09/19/2023 1118   NA 138 09/16/2011 1753   K 3.9 09/19/2023 1118   K 3.9 09/16/2011 1753   CL 97 (L) 09/19/2023 1118   CL 103 09/16/2011 1753   CO2 26 09/19/2023 1118   CO2 23 09/16/2011 1753   GLUCOSE 388 (H) 09/19/2023 1118   GLUCOSE 93 09/16/2011 1753   BUN 24 (H) 09/19/2023 1118   BUN 14 09/16/2011 1753   CREATININE 1.10 (H) 11/08/2023 1744   CREATININE 1.17 (H) 06/03/2022 0812   CALCIUM  9.6 09/19/2023 1118   CALCIUM  9.1 09/16/2011 1753   PROT 6.8 09/19/2023 1118   PROT 7.5 09/16/2011 1753   ALBUMIN  3.7 09/19/2023 1118   ALBUMIN  4.1 09/16/2011 1753   AST 22 09/19/2023 1118  AST 17 09/16/2011 1753   ALT 30 09/19/2023 1118   ALT 23 09/16/2011 1753   ALKPHOS 75 09/19/2023 1118   ALKPHOS 56 09/16/2011 1753   BILITOT 0.6 09/19/2023 1118   BILITOT 0.2 09/16/2011 1753   GFRNONAA >60 09/19/2023 1118   GFRNONAA 79 12/11/2022 1305     Diabetic Labs (most recent): Lab Results  Component Value Date   HGBA1C 12.3 (A) 07/30/2024   HGBA1C 11.8 (A) 03/01/2024   HGBA1C 11.7 (A) 10/31/2023   MICROALBUR 80mg /L 10/31/2023     Lipid Panel ( most recent) Lipid Panel     Component Value Date/Time   CHOL 249 (H) 01/27/2023 0851   TRIG 779 (H) 01/29/2023 0448   HDL 24 (L) 01/27/2023 0851   CHOLHDL 10.4 01/27/2023 0851   VLDL UNABLE TO CALCULATE IF  TRIGLYCERIDE OVER 400 mg/dL 92/95/7975 9148   LDLCALC UNABLE TO CALCULATE IF TRIGLYCERIDE OVER 400 mg/dL 92/95/7975 9148   LDLCALC  06/03/2022 0812     Comment:     . LDL cholesterol not calculated. Triglyceride levels greater than 400 mg/dL invalidate calculated LDL results. . Reference range: <100 . Desirable range <100 mg/dL for primary prevention;   <70 mg/dL for patients with CHD or diabetic patients  with > or = 2 CHD risk factors. SABRA LDL-C is now calculated using the Martin-Hopkins  calculation, which is a validated novel method providing  better accuracy than the Friedewald equation in the  estimation of LDL-C.  Gladis APPLETHWAITE et al. SANDREA. 7986;689(80): 2061-2068  (http://education.[facility].com/faq/FAQ164)    LDLDIRECT 39 01/27/2023 0851      Lab Results  Component Value Date   TSH 4.080 05/19/2018   TSH 2.01 09/16/2011           Assessment & Plan:   1) Type 2 diabetes mellitus with hyperglycemia, with long-term current use of insulin  (HCC)  She presents today with her CGM showing gross hyperglycemia overall, however somewhat improving in recent weeks.  Her POCT A1c today is 12.3%, increasing from last visit of 11.8%.  She denies any hypoglycemia.  Analysis of her CGM shows TIR 5%, TAR 95%, TBR % with a GMI of 10%.  She was sick with the Flu last week.  - Chene K Cavazos has currently uncontrolled symptomatic type 2 DM since 54 years of age.   -Recent labs reviewed.  - I had a long discussion with her about the progressive nature of diabetes and the pathology behind its complications. -her diabetes is complicated by neuropathy and chronic pancreatitis and she remains at a high risk for more acute and chronic complications which include CAD, CVA, CKD, retinopathy, and neuropathy. These are all discussed in detail with her.  The following Lifestyle Medicine recommendations according to American College of Lifestyle Medicine [facility]) were discussed and offered  to patient and she agrees to start the journey:  A. Whole Foods, Plant-based plate comprising of fruits and vegetables, plant-based proteins, whole-grain carbohydrates was discussed in detail with the patient.   A list for source of those nutrients were also provided to the patient.  Patient will use only water  or unsweetened tea for hydration. B.  The need to stay away from risky substances including alcohol , smoking; obtaining 7 to 9 hours of restorative sleep, at least 150 minutes of moderate intensity exercise weekly, the importance of healthy social connections,  and stress reduction techniques were discussed. C.  A full color page of  Calorie density of various food groups per pound showing examples of  each food groups was provided to the patient.  - Nutritional counseling repeated/built upon at each appointment.  - The patient admits there is a room for improvement in their diet and drink choices. -  Suggestion is made for the patient to avoid simple carbohydrates from their diet including Cakes, Sweet Desserts / Pastries, Ice Cream, Soda (diet and regular), Sweet Tea, Candies, Chips, Cookies, Sweet Pastries, Store Bought Juices, Alcohol  in Excess of 1-2 drinks a day, Artificial Sweeteners, Coffee Creamer, and Sugar-free Products. This will help patient to have stable blood glucose profile and potentially avoid unintended weight gain.   - I encouraged the patient to switch to unprocessed or minimally processed complex starch and increased protein intake (animal or plant source), fruits, and vegetables.   - Patient is advised to stick to a routine mealtimes to eat 3 meals a day and avoid unnecessary snacks (to snack only to correct hypoglycemia).  - she has already seen Santana Duke, RDN, CDE for diabetes education.  - I have approached her with the following individualized plan to manage her diabetes and patient agrees:   -I did increase Humulin  R-U500 to 90 units TID with meals if  glucose is above 90 and she is eating.  She can continue Metformin  500 mg ER daily at breakfast.   -she is encouraged to continue monitoring glucose 4 times daily (using her CGM), before meals and before bed (using her CGM), and to call the clinic if she has readings less than 70 or above 300 for 3 tests in a row.   - she is warned not to take insulin  without proper monitoring per orders. - Adjustment parameters are given to her for hypo and hyperglycemia in writing.  - her Doreen was previously discontinued, risk outweighs benefit for this patient- hx of UTI and yeast infections, and yeast on groin.  - she is NOT a good candidate for GLP1 therapy given hx of chronic pancreatitis and significantly elevated triglycerides.   - Specific targets for  A1c; LDL, HDL, and Triglycerides were discussed with the patient.  2) Blood Pressure /Hypertension:  her blood pressure is controlled to target.   she is advised to continue her current medications as prescribed by PCP.  3) Lipids/Hyperlipidemia:    Review of her recent lipid panel from 02/18/23 showed significantly elevated triglycerides of 779, LDL unable to be calculated.  she is advised to continue Crestor  20 mg daily at bedtime.  Side effects and precautions discussed with her.  Recent triglyceride number from 07/13/24 was 2594.  She is on treatment for this through her PCP.  4)  Weight/Diet:  her Body mass index is 41.59 kg/m.  -  clearly complicating her diabetes care.   she is a candidate for weight loss. I discussed with her the fact that loss of 5 - 10% of her  current body weight will have the most impact on her diabetes management.  Exercise, and detailed carbohydrates information provided  -  detailed on discharge instructions.  5) Chronic Care/Health Maintenance: -she is on ACEI/ARB and Statin medications and is encouraged to initiate and continue to follow up with Ophthalmology, Dentist, Podiatrist at least yearly or according to  recommendations, and advised to stay away from smoking. I have recommended yearly flu vaccine and pneumonia vaccine at least every 5 years; moderate intensity exercise for up to 150 minutes weekly; and sleep for at least 7 hours a day.  - she is advised to maintain close follow up with Jamie, Amy  H, PA-C for primary care needs, as well as her other providers for optimal and coordinated care.     I spent  32  minutes in the care of the patient today including review of labs from CMP, Lipids, Thyroid  Function, Hematology (current and previous including abstractions from other facilities); face-to-face time discussing  her blood glucose readings/logs, discussing hypoglycemia and hyperglycemia episodes and symptoms, medications doses, her options of short and long term treatment based on the latest standards of care / guidelines;  discussion about incorporating lifestyle medicine;  and documenting the encounter. Risk reduction counseling performed per USPSTF guidelines to reduce obesity and cardiovascular risk factors.     Please refer to Patient Instructions for Blood Glucose Monitoring and Insulin /Medications Dosing Guide  in media tab for additional information. Please  also refer to  Patient Self Inventory in the Media  tab for reviewed elements of pertinent patient history.  Larine K Hitchman participated in the discussions, expressed understanding, and voiced agreement with the above plans.  All questions were answered to her satisfaction. she is encouraged to contact clinic should she have any questions or concerns prior to her return visit.    Follow up plan: - Return in about 4 months (around 11/27/2024) for Diabetes F/U with A1c in office, No previsit labs, Bring meter and logs.  Benton Rio, [facility] [address] [place], [place] [address] Phone: [phone] Fax: [phone]  07/30/2024, 2:36 PM    "

## 2024-08-03 ENCOUNTER — Ambulatory Visit (HOSPITAL_COMMUNITY)

## 2024-08-15 ENCOUNTER — Ambulatory Visit (HOSPITAL_COMMUNITY): Admitting: Psychiatry

## 2024-08-15 ENCOUNTER — Other Ambulatory Visit: Payer: Self-pay

## 2024-08-15 VITALS — BP 131/83 | HR 96 | Ht 63.0 in | Wt 241.0 lb

## 2024-08-15 DIAGNOSIS — F323 Major depressive disorder, single episode, severe with psychotic features: Secondary | ICD-10-CM

## 2024-08-15 DIAGNOSIS — F324 Major depressive disorder, single episode, in partial remission: Secondary | ICD-10-CM | POA: Diagnosis not present

## 2024-08-15 MED ORDER — DULOXETINE HCL 60 MG PO CPEP: 120.0000 mg | ORAL_CAPSULE | Freq: Every day | ORAL | 4 refills | Status: AC

## 2024-08-15 MED ORDER — TRAZODONE HCL 100 MG PO TABS: 400.0000 mg | ORAL_TABLET | Freq: Every day | ORAL | 1 refills | Status: AC

## 2024-08-15 MED ORDER — AUVELITY 45-105 MG PO TBCR: EXTENDED_RELEASE_TABLET | ORAL | 3 refills | Status: AC

## 2024-08-15 MED ORDER — ARIPIPRAZOLE 20 MG PO TABS: 20.0000 mg | ORAL_TABLET | Freq: Every day | ORAL | 1 refills | Status: AC

## 2024-08-15 MED ORDER — CLONAZEPAM 1 MG PO TABS: ORAL_TABLET | ORAL | 4 refills | Status: AC

## 2024-08-15 NOTE — Progress Notes (Signed)
 Patient ID: Jamie [name], female   DOB: 1971/01/04, 55 y.o.   MRN: [id number] [facility] MD Progress Note  08/15/2024 2:38 PM Jamie [name]  MRN:  [id number] Subjective:  Feeling well. Princ    Today the patient is doing fairly well.  She has some financial stresses.  Her husband Jamie [name] misunderstood her about putting out $300 to buy a television for her friend that he was visiting.  He asked her if it was okay she said yes but she states she was only getting.  She was then shocked when she found out that he took her seriously and bought them a TV.  She said they needed the money for taxes.  She said they will get by and she is not really mad at Jamie [name] she acknowledges some of this is her fault.  The patient is 74 year old mother is doing well.  Her dog is doing well.  The patient lives with her sister and her sister's friend's name is Jamie [name].  Jamie [name] does not do anything to bring money into the house he does not pay rent and he spends his time smoking marijuana.  The patient wishes that he would leave but the patient is waiting for the house to be signed over to her.  Once that happens then she has legal grounds to insist that Jamie [name] would leave the house.  Other than that the patient is doing pretty well.  She continues in therapy.  She takes all the medicines prescribed.  Patient spends her time watching television interestingly watching children's cartoons.  Again the patient is sleeping and eating well has good energy.  She is not suicidal. Virtual Visit via Telephone Note  I connected with Jamie [name] on 05/13/2021 at  2:00 PM EST by telephone and verified that I am speaking with the correct person using two identifiers.  Location: Patient: home Provider: office   I discussed the limitations, risks, security and privacy concerns of performing an evaluation and management service by telephone and the availability of in person appointments. I also discussed with the patient that there may be a  patient responsible charge related to this service. The patient expressed understanding and agreed to proceed.    I discussed the assessment and treatment plan with the patient. The patient was provided an opportunity to ask questions and all were answered. The patient agreed with the plan and demonstrated an understanding of the instructions.   The patient was advised to call back or seek an in-person evaluation if the symptoms worsen or if the condition fails to improve as anticipated.  I provided 30 minutes of non-face-to-face time during this encounter.   Jamie LILLETTE Lo, MD   Past Medical History:  Diagnosis Date   Anxiety    Arthritis    knees   Bipolar 1 disorder (HCC)    Chronic diarrhea    Diverticulosis of colon    GERD (gastroesophageal reflux disease)    Hiatal hernia    History of concussion    per pt 11/ 2020 w/ no residual   Hyperlipidemia    Hypertension    followed by pcp   (11-29-2019  per pt stated never had stress test)   MDD (major depressive disorder)    Migraine    neurologist--- dr Jamie [name]   OSA on CPAP    followed by dr Jamie [name]   PCOS (polycystic ovarian syndrome)    PONV (postoperative nausea and vomiting)    Type 2 diabetes mellitus (HCC)  followed by pcp   (11-29-2019  pt stated checks blood sugar daily in am,  fasting sugar--- 96    Past Surgical History:  Procedure Laterality Date   ANAL FISSURE REPAIR  x3  last one  2007  approx.   BIOPSY  09/06/2019   Procedure: BIOPSY;  Surgeon: Jamie Lamar HERO, MD;  Location: [name];  Service: Endoscopy;;   COLONOSCOPY WITH PROPOFOL  [name] 09/06/2019   pancolonic diverticulosis. Segmental biopsy completed with benign biopsies.    ESOPHAGOGASTRODUODENOSCOPY (EGD) WITH PROPOFOL  [name] 06/16/2020   Dr. Shaaron: normal esophagus s/p [name], normal stomach, duodenum.    ESOPHAGOGASTRODUODENOSCOPY (EGD) WITH PROPOFOL  [name] 02/10/2022   Procedure: ESOPHAGOGASTRODUODENOSCOPY (EGD) WITH PROPOFOL ;  Surgeon: Jamie Lamar HERO, MD;  Location: [name];  Service: Endoscopy;  Laterality: [name];  9:45am   EVALUATION UNDER ANESTHESIA WITH HEMORRHOIDECTOMY [name] 12/05/2019   Procedure: ANORECTAL EXAM UNDER ANESTHESIA WITH HEMORRHOIDECTOMY, HEMORRHOIDAL LIGATION/PEXY;  Surgeon: Jamie Standing, MD;  Location: [name];  Service: General;  Laterality: [name];  GENERAL AND LOCAL   EVALUATION UNDER ANESTHESIA WITH TEAR DUCT PROBING  infant   KNEE ARTHROSCOPY Right 1994   LAPAROSCOPIC CHOLECYSTECTOMY  08-06-2002  @Jamie    LEFT HEART CATH AND CORONARY ANGIOGRAPHY [name] 10/03/2023   Procedure: LEFT HEART CATH AND CORONARY ANGIOGRAPHY;  Surgeon: Jamie Heinz, MD;  Location: [name];  Service: Cardiovascular;  Laterality: [name];   LUMBAR LAMINECTOMY/DECOMPRESSION MICRODISCECTOMY Right 01/22/2014   Procedure: LUMBAR FOUR TO FIVE LUMBAR LAMINECTOMY/DECOMPRESSION MICRODISCECTOMY 1 LEVEL;  Surgeon: Jamie [name] Stains, MD;  Location: [name];  Service: Neurosurgery;  Laterality: Right;  Right L45 diskectomy   MALONEY [name] [name] 06/16/2020   Procedure: Jamie [name];  Surgeon: Jamie Lamar HERO, MD;  Location: [name];  Service: Endoscopy;  Laterality: [name];   MALONEY [name] [name] 02/10/2022   Procedure: Jamie [name];  Surgeon: Jamie Lamar HERO, MD;  Location: [name];  Service: Endoscopy;  Laterality: [name];   Family History:  Family History  Problem Relation Age of Onset   COPD Father    Anxiety disorder Maternal Grandmother    Depression Maternal Grandmother    Colon cancer Neg Hx    Colon polyps Neg Hx    Family Psychiatric  History:  Social History:  Social History   Substance and Sexual Activity  Alcohol  Use No     Social History   Substance and Sexual Activity  Drug Use Never    Social History   Socioeconomic History   Marital status: Married    Spouse name: Not on file   Number of children: 0   Years of education: college   Highest education level: Not on file   Occupational History   Occupation: disabled  Tobacco Use   Smoking status: Never   Smokeless tobacco: Never  Vaping Use   Vaping status: Never Used  Substance and Sexual Activity   Alcohol  use: No   Drug use: Never   Sexual activity: Yes    Partners: Male    Birth control/protection: None  Other Topics Concern   Not on file  Social History Narrative   Patient lives at home alone    Patient is right handed   Patient drinks soda's daily   Social Drivers of Health   Tobacco Use: Low Risk (07/30/2024)   Patient History    Smoking Tobacco Use: Never    Smokeless Tobacco Use: Never    Passive Exposure: Not on file  Financial Resource Strain: Not  on file  Food Insecurity: No Food Insecurity (01/27/2023)   Hunger Vital Sign    Worried About Running Out of Food in the Last Year: Never true    Ran Out of Food in the Last Year: Never true  Transportation Needs: No Transportation Needs (01/27/2023)   PRAPARE - Administrator, Civil Service (Medical): No    Lack of Transportation (Non-Medical): No  Physical Activity: Not on file  Stress: Not on file  Social Connections: Not on file  Depression (PHQ2-9): High Risk (01/17/2024)   Depression (PHQ2-9)    PHQ-2 Score: 19  Alcohol  Screen: Not on file  Housing: Low Risk (01/27/2023)   Housing    Last Housing Risk Score: 0  Utilities: Not At Risk (01/27/2023)   [facility] Utilities    Threatened with loss of utilities: No  Health Literacy: Low Risk (09/03/2021)   Received from [facility] Literacy    How often do you need to have someone help you when you read instructions, pamphlets, or other written material from your doctor or pharmacy?: Never   Additional Social History:                         Sleep: Fair  Appetite:  Good  Current Medications: Current Outpatient Medications  Medication Sig Dispense Refill   ACCU-CHEK GUIDE test strip      albuterol  (VENTOLIN  HFA) 108 (90 Base) MCG/ACT inhaler Inhale 2  puffs into the lungs every 4 (four) hours as needed for wheezing or shortness of breath. 1 each 3   amLODipine  (NORVASC ) 5 MG tablet Take 1 tablet (5 mg total) by mouth daily. 90 tablet 0   Continuous Glucose Sensor (DEXCOM G7 SENSOR) MISC USE AS DIRECTED AND CHANGE EVERY 10 DAYS 9 each 3   dexlansoprazole  (DEXILANT ) 60 MG capsule Take 1 capsule (60 mg total) by mouth daily. 30 capsule 2   dicyclomine  (BENTYL ) 10 MG capsule Take 1 capsule (10 mg total) by mouth 2 (two) times daily as needed for spasms. 30 capsule 0   fenofibrate  micronized (LOFIBRA) 134 MG capsule TAKE ONE CAPSULE BY MOUTH DAILY 90 capsule 2   fluticasone  (FLONASE) 50 MCG/ACT nasal spray Place 1 spray into both nostrils at bedtime as needed for allergies or rhinitis.      furosemide  (LASIX ) 20 MG tablet TAKE 1 TABLET BY MOUTH DAILY 90 tablet 2   gabapentin  (NEURONTIN ) 600 MG tablet Take 600 mg by mouth 2 (two) times daily.     Insulin  Pen Needle (CARETOUCH PEN NEEDLES) 31G X 6 MM MISC Use to inject insulin  3 times daily 300 each 3   insulin  regular human CONCENTRATED (HUMULIN  R U-500 KWIKPEN) 500 UNIT/ML KwikPen Inject 90 Units into the skin 3 (three) times daily with meals. 50 mL 3   metFORMIN  (GLUCOPHAGE -XR) 500 MG 24 hr tablet Take 1 tablet (500 mg total) by mouth daily with breakfast. 90 tablet 3   metoprolol  tartrate (LOPRESSOR ) 25 MG tablet TAKE 1 TABLET BY MOUTH TWICE DAILY 180 tablet 2   Multiple Vitamins-Minerals (MULTIVITAMIN WITH MINERALS) tablet Take 1 tablet by mouth daily.     nitroGLYCERIN  (NITROSTAT ) 0.4 MG SL tablet Place 1 tablet (0.4 mg total) under the tongue every 5 (five) minutes x 3 doses as needed (if no relief after 3rd dose proceed to ED or call 911). 25 tablet 2   NON FORMULARY Pt uses a cpap nightly     omega-3 acid  ethyl esters (LOVAZA ) 1 g capsule Take 2 g by mouth 2 (two) times daily.     omeprazole  (PRILOSEC) 40 MG capsule TAKE ONE CAPSULE BY MOUTH DAILY 30 capsule 11   ondansetron  (ZOFRAN -ODT) 4 MG  disintegrating tablet TAKE 1 TABLET BY MOUTH EVERY 8 HOURS AS NEEDED FOR NAUSEA AND VOMITING 30 tablet 3   Pancrelipase , Lip-Prot-Amyl, (ZENPEP ) 40000-126000 units CPEP TAKE TWO CAPSULES BY MOUTH THREE TIMES DAILY WITH MEALS AND TAKE 1 CAPSULE with AT least 2 snacks 270 capsule 3   ranolazine  (RANEXA ) 500 MG 12 hr tablet Take 1 tablet (500 mg total) by mouth 2 (two) times daily. 180 tablet 2   rosuvastatin  (CRESTOR ) 10 MG tablet TAKE 1 TABLET BY MOUTH AT BEDTIME 90 tablet 2   valsartan  (DIOVAN ) 80 MG tablet TAKE 1 TABLET BY MOUTH DAILY 30 tablet 2   ARIPiprazole  (ABILIFY ) 20 MG tablet Take 1 tablet (20 mg total) by mouth daily. 90 tablet 1   clonazePAM  (KLONOPIN ) 1 MG tablet Take 1 mg in the morning and 2 mg at night 90 tablet 4   Dextromethorphan-buPROPion  ER (AUVELITY ) 45-105 MG TBCR 1 qam for 1 week then 1 bid 60 tablet 3   DULoxetine  (CYMBALTA ) 60 MG capsule Take 2 capsules (120 mg total) by mouth at bedtime. 60 capsule 4   traZODone  (DESYREL ) 100 MG tablet Take 4 tablets (400 mg total) by mouth at bedtime. 360 tablet 1   No current facility-administered medications for this visit.    [name] Results: No results found for this or any previous visit (from the past 48 hours).  Physical Findings: AIMS:  , ,  ,  ,    CIWA:    COWS:     Musculoskeletal: Strength & Muscle Tone: within normal limits Gait & Station: normal Patient leans: Right  Psychiatric Specialty Exam: ROS  Blood pressure 131/83, pulse 96, height 5' 3 (1.6 m), weight 241 lb (109.3 kg).Body mass index is 42.69 kg/m.  General Appearance: Casual  Eye Contact::  Good  Speech:  Clear and Coherent  Volume:  Normal  Mood:Depressed  Affect:  Appropriate  Today the patient is doing very well. Thought Process:  Coherent  Orientation:  Full (Time, Place, and Person)  Thought Content:  NA and WDL  Suicidal Thoughts:  yes  Homicidal Thoughts:  No  Memory:  Immediate;   NA  Judgement:  Good  Insight:  Good  Psychomotor  Activity:  NA  Concentration:  Good  Recall:  Good  Fund of Knowledge:Good  Language: Good  Akathisia:  No  Handed:  Right  AIMS (if indicated):     Assets:  Desire for Improvement  ADL's:  Intact  Cognition: WNL  Sleep:      Treatment Plan Summary: 08/15/2024, 2:38 PM     This patient is first diagnosis is that of major depression.  She takes Cymbalta , Abilify  and Auvelity .  Her second problem is insomnia.  She takes trazodone  which works very well for her.  Her third problem is an adjustment disorder with an anxious mood state.  She will continue taking Klonopin  as prescribed.  Overall the patient is stable and functioning fairly well.  She will return to see me in 3 months.

## 2024-08-22 ENCOUNTER — Encounter (HOSPITAL_COMMUNITY): Payer: Self-pay

## 2024-08-22 NOTE — Therapy (Unsigned)
 " OUTPATIENT PHYSICAL THERAPY THORACOLUMBAR EVALUATION   Patient Name: Jamie [name] MRN: [id number] DOB:06/24/1971, 54 y.o., female Today's Date: 08/22/2024  END OF SESSION:   Past Medical History:  Diagnosis Date   Anxiety    Arthritis    knees   Bipolar 1 disorder (HCC)    Chronic diarrhea    Diverticulosis of colon    GERD (gastroesophageal reflux disease)    Hiatal hernia    History of concussion    per pt 11/ 2020 w/ no residual   Hyperlipidemia    Hypertension    followed by pcp   (11-29-2019  per pt stated never had stress test)   MDD (major depressive disorder)    Migraine    neurologist--- dr onita   OSA on CPAP    followed by dr dohmeier   PCOS (polycystic ovarian syndrome)    PONV (postoperative nausea and vomiting)    Type 2 diabetes mellitus (HCC)    followed by pcp   (11-29-2019  pt stated checks blood sugar daily in am,  fasting sugar--- 96   Past Surgical History:  Procedure Laterality Date   ANAL FISSURE REPAIR  x3  last one  2007  approx.   BIOPSY  09/06/2019   Procedure: BIOPSY;  Surgeon: Shaaron Lamar HERO, MD;  [facility];  Service: Endoscopy;;   COLONOSCOPY WITH PROPOFOL  [name] 09/06/2019   pancolonic diverticulosis. Segmental biopsy completed with benign biopsies.    ESOPHAGOGASTRODUODENOSCOPY (EGD) WITH PROPOFOL  [name] 06/16/2020   Dr. Shaaron: normal esophagus s/p dilation, normal stomach, duodenum.    ESOPHAGOGASTRODUODENOSCOPY (EGD) WITH PROPOFOL  [name] 02/10/2022   Procedure: ESOPHAGOGASTRODUODENOSCOPY (EGD) WITH PROPOFOL ;  Surgeon: Shaaron Lamar HERO, MD;  [facility];  Service: Endoscopy;  Laterality: [name];  9:45am   EVALUATION UNDER ANESTHESIA WITH HEMORRHOIDECTOMY [name] 12/05/2019   Procedure: ANORECTAL EXAM UNDER ANESTHESIA WITH HEMORRHOIDECTOMY, HEMORRHOIDAL LIGATION/PEXY;  Surgeon: Sheldon Standing, MD;  Location: [facility];  Service: General;  Laterality: [name];  GENERAL AND LOCAL   EVALUATION UNDER ANESTHESIA WITH TEAR  DUCT PROBING  infant   KNEE ARTHROSCOPY Right 1994   LAPAROSCOPIC CHOLECYSTECTOMY  08-06-2002  @[facility]    LEFT HEART CATH AND CORONARY ANGIOGRAPHY [name] 10/03/2023   Procedure: LEFT HEART CATH AND CORONARY ANGIOGRAPHY;  Surgeon: Ladona Heinz, MD;  Location: [facility];  Service: Cardiovascular;  Laterality: [name];   LUMBAR LAMINECTOMY/DECOMPRESSION MICRODISCECTOMY Right 01/22/2014   Procedure: LUMBAR FOUR TO FIVE LUMBAR LAMINECTOMY/DECOMPRESSION MICRODISCECTOMY 1 LEVEL;  Surgeon: Catalina HERO Stains, MD;  Location: [facility];  Service: Neurosurgery;  Laterality: Right;  Right L45 diskectomy   MALONEY DILATION [name] 06/16/2020   Procedure: AGAPITO DILATION;  Surgeon: Shaaron Lamar HERO, MD;  [facility];  Service: Endoscopy;  Laterality: [name];   MALONEY DILATION [name] 02/10/2022   Procedure: AGAPITO DILATION;  Surgeon: Shaaron Lamar HERO, MD;  [facility];  Service: Endoscopy;  Laterality: [name];   Patient Active Problem List   Diagnosis Date Noted   ERRONEOUS ENCOUNTER--DISREGARD 09/16/2023   Cardiac chest pain 08/10/2023   Asthmatic bronchitis , chronic (HCC) 06/28/2023   DOE (dyspnea on exertion) 06/28/2023   Atypical chest pain 04/26/2023   Hypertriglyceridemia without hypercholesterolemia 01/28/2023   DM2 (diabetes mellitus, type 2) (HCC) 01/26/2023   Acute pancreatitis 01/26/2023   Chronic diastolic (congestive) heart failure (HCC) 05/04/2022   Class 2 obesity with body mass index (BMI) of 38.0 to 38.9 in adult 05/04/2022   Chronic pancreatitis (HCC) 05/03/2022  Xerostomia 11/06/2020   Pneumonia due to COVID-19 virus 09/06/2020   GERD (gastroesophageal reflux disease) 03/20/2020   Dysphagia 03/20/2020   Wheezing 02/22/2020   Prolapsed internal hemorrhoids, grade 4 12/05/2019   Abdominal pain, chronic, epigastric 09/18/2019   Chronic diarrhea 06/12/2019   Common migraine 11/03/2016   OSA on CPAP 11/03/2016   Essential hypertension 02/01/2014   Lumbar herniated disc  01/22/2014   Headache 12/06/2012   Bipolar I disorder, severe, current or most recent episode depressed, with psychotic features (HCC) 11/15/2012   Severe major depression with psychotic features, mood-congruent (HCC) 05/03/2012   Generalized anxiety disorder 06/09/2011   Gastroesophageal reflux disease 12/22/2010    PCP: Jolee Greig DEL, PA-C  REFERRING PROVIDER: Jolee Greig DEL, PA-C  REFERRING DIAG:  M54.9 (ICD-10-CM) - Upper back pain    Rationale for Evaluation and Treatment: Rehabilitation  THERAPY DIAG:  No diagnosis found.  ONSET DATE: ***  SUBJECTIVE:                                                                                                                                                                                           SUBJECTIVE STATEMENT: ***  PERTINENT HISTORY:  ***  PAIN:  Are you having pain? {OPRCPAIN:27236}  PRECAUTIONS: {Therapy precautions:24002}  RED FLAGS: {PT Red Flags:29287}   WEIGHT BEARING RESTRICTIONS: {Yes ***/No:24003}  FALLS:  Has patient fallen in last 6 months? {fallsyesno:27318}  LIVING ENVIRONMENT: Lives with: {OPRC lives with:25569::lives with their family} Lives in: {Lives in:25570} Stairs: {opstairs:27293} Has following equipment at home: {Assistive devices:23999}  OCCUPATION: ***  PLOF: {PLOF:24004}  PATIENT GOALS: ***  NEXT MD VISIT: ***  OBJECTIVE:  Note: Objective measures were completed at Evaluation unless otherwise noted.  DIAGNOSTIC FINDINGS:  IMPRESSION: 1. Slight interval progression of multilevel lumbar spondylosis compared to 08/08/2014. 2. Findings are most pronounced at L3-4 where there is mild bilateral foraminal stenosis and moderate canal stenosis.  PATIENT SURVEYS:  {rehab surveys:24030}  COGNITION: Overall cognitive status: {cognition:24006}     SENSATION: {sensation:27233}  MUSCLE LENGTH: Hamstrings: Right *** deg; Left *** deg Debby test: Right *** deg; Left ***  deg  POSTURE: {posture:25561}  PALPATION: ***  LUMBAR ROM:   AROM eval  Flexion   Extension   Right lateral flexion   Left lateral flexion   Right rotation   Left rotation    (Blank rows = not tested)  LOWER EXTREMITY ROM:     {AROM/PROM:27142}  Right eval Left eval  Hip flexion    Hip extension    Hip abduction    Hip adduction    Hip internal rotation    Hip external rotation  Knee flexion    Knee extension    Ankle dorsiflexion    Ankle plantarflexion    Ankle inversion    Ankle eversion     (Blank rows = not tested)  LOWER EXTREMITY MMT:    MMT Right eval Left eval  Hip flexion    Hip extension    Hip abduction    Hip adduction    Hip internal rotation    Hip external rotation    Knee flexion    Knee extension    Ankle dorsiflexion    Ankle plantarflexion    Ankle inversion    Ankle eversion     (Blank rows = not tested)  LUMBAR SPECIAL TESTS:  {lumbar special test:25242}  FUNCTIONAL TESTS:  {Functional tests:24029}  GAIT: Distance walked: *** Assistive device utilized: {Assistive devices:23999} Level of assistance: {Levels of assistance:24026} Comments: ***  TREATMENT DATE: ***                                                                                                                                 PATIENT EDUCATION:  Education details: *** Person educated: {Person educated:25204} Education method: {Education Method:25205} Education comprehension: {Education Comprehension:25206}  HOME EXERCISE PROGRAM: ***  ASSESSMENT:  CLINICAL IMPRESSION: Patient is a *** y.o. *** who was seen today for physical therapy evaluation and treatment for ***.   OBJECTIVE IMPAIRMENTS: {opptimpairments:25111}.   ACTIVITY LIMITATIONS: {activitylimitations:27494}  PARTICIPATION LIMITATIONS: {participationrestrictions:25113}  PERSONAL FACTORS: {Personal factors:25162} are also affecting patient's functional outcome.   REHAB POTENTIAL:  {rehabpotential:25112}  CLINICAL DECISION MAKING: {clinical decision making:25114}  EVALUATION COMPLEXITY: {Evaluation complexity:25115}   GOALS: Goals reviewed with patient? {yes/no:20286}  SHORT TERM GOALS: Target date: ***  *** Baseline: Goal status: INITIAL  2.  *** Baseline:  Goal status: INITIAL  3.  *** Baseline:  Goal status: INITIAL  4.  *** Baseline:  Goal status: INITIAL  5.  *** Baseline:  Goal status: INITIAL  6.  *** Baseline:  Goal status: INITIAL  LONG TERM GOALS: Target date: ***  *** Baseline:  Goal status: INITIAL  2.  *** Baseline:  Goal status: INITIAL  3.  *** Baseline:  Goal status: INITIAL  4.  *** Baseline:  Goal status: INITIAL  5.  *** Baseline:  Goal status: INITIAL  6.  *** Baseline:  Goal status: INITIAL  PLAN:  PT FREQUENCY: {rehab frequency:25116}  PT DURATION: {rehab duration:25117}  PLANNED INTERVENTIONS: {rehab planned interventions:25118::97110-Therapeutic exercises,97530- Therapeutic [phone]- Neuromuscular re-education,97535- Self Rjmz,02859- Manual therapy,Patient/Family education}.  PLAN FOR NEXT SESSION: ***   Rosaria BRAVO Powell-Butler, PT 08/22/2024, 4:36 PM  "

## 2024-08-22 NOTE — ED Provider Notes (Signed)
 "                                                                                   Emergency Department Provider Note    ED Clinical Impression   Final diagnoses:  Acute pancreatitis, unspecified complication status, unspecified pancreatitis type (HHS-HCC) (Primary)  Pain of upper abdomen  Pancreatic necrosis (HHS-HCC)    ED Assessment/Plan    Condition: Stable Disposition: Transfer  This chart has been completed using Engineer, Civil (consulting) software, and while attempts have been made to ensure accuracy, certain words and phrases may not be transcribed as intended.   History   Chief Complaint  Patient presents with   Abdominal Pain   Emesis   HPI  Jamie [name] is a 54 y.o. female  who presents today to the  emergency department complaining of epigastric pain.  Patient states that her dog jumped on her abdomen several times yesterday and today.  The dog is not used to be in the house she states.  Complaints of pain.  Pain is worse with movement.  Associated symptoms include nausea.  There are no obvious aggravating or relieving factors.    Allergies: is allergic to codeine and opioids - morphine  analogues. Medications: has a current medication list which includes the following long-term medication(s): aripiprazole , aripiprazole , clonazepam , duloxetine , furosemide , gabapentin , gabapentin , lisinopril , metformin , metoprolol  tartrate, ranolazine , rosuvastatin , trazodone , and valsartan . PMHx:  has a past medical history of Depression, Hypertension (09/06/2020), PCOS (polycystic ovarian syndrome), Pneumonia due to COVID-19 virus (09/06/2020), Sleep apnea, and Type 2 diabetes mellitus (CMS-HCC) (09/06/2020). PSHx:  has a past surgical history that includes Cholecystectomy; Back surgery; Annual fistula repair; and Hemorrhoid surgery. SocHx:  reports that she has never smoked. She has never used smokeless tobacco. She reports that she does not currently use alcohol . She  reports that she does not use drugs. Allergies, Medications, Medical, Surgical, and Social History were reviewed as documented above.   Social Drivers of Health with Concerns   Housing: Not on file  Physical Activity: Not on file  Stress: Not on file  Interpersonal Safety: Not on file  Substance Use: Not on file (06/01/2023)  Social Connections: Not on file  Financial Resource Strain: Not on file  Internet Connectivity: Not on file     Review Of Systems  Review of Systems  Constitutional:  Negative for fever.  HENT:  Negative for congestion.   Respiratory:  Negative for chest tightness and shortness of breath.   Cardiovascular:  Negative for chest pain.  Gastrointestinal:  Positive for abdominal pain and nausea.  Skin:  Negative for color change.  Psychiatric/Behavioral:  Negative for behavioral problems.   All other systems reviewed and are negative.   Physical Exam   BP (!) 155/97   Pulse 118   Temp 36.4 C (97.6 F) (Oral)   Resp 20   Ht 160 cm (5' 3)   Wt (!) 107.9 kg (237 lb 12.8 oz)   SpO2 95%   BMI 42.12 kg/m   Physical Exam Vitals and nursing note reviewed.  Constitutional:      General: She is not in acute distress. HENT:     Head: Normocephalic.  Eyes:     Conjunctiva/sclera: Conjunctivae normal.  Cardiovascular:     Rate and Rhythm: Regular rhythm.     Pulses: Normal pulses.     Heart sounds: Normal heart sounds.  Pulmonary:     Effort: No respiratory distress.     Breath sounds: Normal breath sounds.  Abdominal:     General: There is no distension.     Tenderness: There is abdominal tenderness. There is no guarding or rebound.     Comments: There is epigastric tenderness.  No guarding or rebound.  Normal active bowel sounds.  Musculoskeletal:        General: No deformity.  Skin:    General: Skin is warm.     Capillary Refill: Capillary refill takes 2 to 3 seconds.     Comments: Normal cap refill.  Neurological:     General: No focal  deficit present.     Mental Status: She is oriented to person, place, and time.  Psychiatric:        Mood and Affect: Mood normal.     ED Course  Medical Decision Making Differential diagnose includes abdominal wall contusion versus solid organ injury from trauma versus gastritis or pancreatitis.  Will get CT scan.  EKG shows normal sinus rhythm at 111 beats per liter normal axis.  No acute injury pattern.  2:47 AM Labs and imaging consistent with acute pancreatitis.  Patient states that she has had pancreatitis before from Ozempic.  She stopped taking it about a year ago.  Will admit patient.  2:57 AM Patient's care discussed with Dr. Maranda, surgery given necrosis on CT scan.  He recommends transfer to higher level of care as patient is in need debridement for necrosis.  Plan to transfer patient.  3:15 AM Pt's care discussed with Dr. Delcia, general surgery at [facility]. He accepts pt in transer but wants medicine to admit and surgery will consult.  3:32 AM Patient's care discussed with Dr.Kakrakandi, hospitalist at [facility].  Accepts patient in transfer.  4:21 AM Patient has have an increase in urine gap.  Her pH is normal from her blood gas.  She also has no ketones in the blood with a negative ACE test.  Consequently, there is no clinical evidence for DKA.  Will continue to hydrate patient aggressively.  This should help narrow the gap.  Bicarb is normal.  Problems Addressed: Acute pancreatitis, unspecified complication status, unspecified pancreatitis type (HHS-HCC): acute illness or injury that poses a threat to life or bodily functions Pain of upper abdomen: acute illness or injury that poses a threat to life or bodily functions  Amount and/or Complexity of Data Reviewed Labs: ordered. Radiology: ordered. ECG/medicine tests: ordered.  Risk Prescription drug management.     Procedures   Encounter Date: 08/21/24  ECG 12 Lead  Result Value   EKG Systolic BP     EKG Diastolic BP    EKG Ventricular Rate 119   EKG Atrial Rate 119   EKG P-R Interval 162   EKG QRS Duration 82   EKG Q-T Interval 330   EKG QTC Calculation 464   EKG Calculated P Axis 46   EKG Calculated R Axis 52   EKG Calculated T Axis 65   QTC Fredericia 414   Narrative   Sinus tachycardia Cannot rule out Anterior infarct , age undetermined Abnormal ECG When compared with ECG of 25-Aug-2023 19:45, Nonspecific T wave abnormality now evident in Lateral leads Confirmed by Cherie Searle ([id number]) on 08/22/2024 5:18:37  AM     ED Results Results for orders placed or performed during the hospital encounter of 08/21/24  Lipase Level  Result Value Ref Range   Lipase 1,832 (H) 16 - 77 U/L  Magnesium  Level  Result Value Ref Range   Magnesium  1.4 (L) 1.6 - 2.6 mg/dL  Comprehensive Metabolic Panel  Result Value Ref Range   Sodium 139 135 - 145 mmol/L   Potassium 3.6 3.5 - 5.0 mmol/L   Chloride 99 98 - 107 mmol/L   CO2 21.2 21.0 - 32.0 mmol/L   Anion Gap >15 (H) 3 - 11 mmol/L   BUN 19 8 - 20 mg/dL   Creatinine 9.12 9.39 - 1.10 mg/dL   BUN/Creatinine Ratio 22    eGFR CKD-EPI (2021) Female 80 >=60 mL/min/1.35m2   Glucose 394 (H) 70 - 179 mg/dL   Calcium  8.2 (L) 8.5 - 10.1 mg/dL   Albumin  3.5 3.5 - 5.0 g/dL   Total Protein 7.8 6.0 - 8.0 g/dL   Total Bilirubin 1.0 0.3 - 1.2 mg/dL   AST 41 (H) 15 - 40 U/L   ALT 39 12 - 78 U/L   Alkaline Phosphatase 66 46 - 116 U/L  hCG Quantitative, Blood  Result Value Ref Range   hCG Quantitative <1.0 mIU/mL  ACETEST  Result Value Ref Range   ACETEST Negative Negative  ECG 12 Lead  Result Value Ref Range   EKG Systolic BP  mmHg   EKG Diastolic BP  mmHg   EKG Ventricular Rate 119 BPM   EKG Atrial Rate 119 BPM   EKG P-R Interval 162 ms   EKG QRS Duration 82 ms   EKG Q-T Interval 330 ms   EKG QTC Calculation 464 ms   EKG Calculated P Axis 46 degrees   EKG Calculated R Axis 52 degrees   EKG Calculated T Axis 65 degrees   QTC Fredericia  414 ms  Venous Blood Gas  Result Value Ref Range   pH, Venous 7.39 7.32 - 7.43   pCO2, Ven 34.8 (L) 40.0 - 60.0 mmHg   pO2, Ven 71.9 (H) 30.0 - 55.0 mmHg   HCO3, Ven 21.1 (L) 22.0 - 27.0 mmol/L   Base Excess, Ven -3.1 (L) -2.0 - 3.0 mmol/L   O2 Saturation, Venous 92.0 (H) 40.0 - 85.0 %   Operator ID    CBC w/ Differential  Result Value Ref Range   WBC 5.5 4.0 - 10.5 10*9/L   RBC 5.53 (H) 3.80 - 5.10 10*12/L   HGB 15.7 (H) 11.5 - 15.0 g/dL   HCT 53.1 (H) 65.9 - 55.9 %   MCV 84.6 80.0 - 98.0 fL   MCH 28.4 27.0 - 34.0 pg   MCHC 33.5 32.0 - 36.0 g/dL   RDW 84.4 (H) 88.4 - 85.4 %   MPV 11.4 (H) 7.4 - 10.4 fL   Platelet 161 140 - 415 10*9/L   Neutrophils % 75.8 %   Lymphocytes % 12.9 %   Monocytes % 9.8 %   Eosinophils % 0.2 %   Basophils % 0.4 %   Absolute Neutrophils 4.2 1.8 - 7.8 10*9/L   Absolute Lymphocytes 0.7 0.7 - 4.5 10*9/L   Absolute Monocytes 0.5 0.1 - 1.0 10*9/L   Absolute Eosinophils 0.0 0.0 - 0.4 10*9/L   Absolute Basophils 0.0 0.0 - 0.2 10*9/L  Urinalysis with Microscopy with Culture Reflex  Result Value Ref Range   Color, UA Light Yellow    Clarity, UA Clear Clear   Specific  Gravity, UA >1.060 (H) 1.010 - 1.025   pH, UA 5.5 5.0 - 8.0   Leukocyte Esterase, UA Trace (A) Negative   Nitrite, UA Negative Negative   Protein, UA 30 mg/dL (A) Negative   Glucose, UA >1000 mg/dL (A) Negative, Trace   Ketones, UA 20 mg/dL (A) Negative   Urobilinogen, UA <2.0 mg/dL <7.9 mg/dL   Bilirubin, UA Negative Negative   Blood, UA Trace (A) Negative   RBC, UA 13 (H) 0 - 3 /HPF   WBC, UA 9 (H) 0 - 3 /HPF   Squam Epithel, UA 28 (H) 0 - 10 /HPF   Bacteria, UA Few (A) None Seen /HPF   WBC Clumps None Seen None Seen /HPF   Hyphal Yeast None Seen None Seen /HPF   Yeast, UA None Seen None Seen /HPF   ECG 12 Lead Result Date: 08/22/2024 Sinus tachycardia Cannot rule out Anterior infarct , age undetermined Abnormal ECG When compared with ECG of 25-Aug-2023 19:45, Nonspecific T wave  abnormality now evident in Lateral leads Confirmed by Cherie Searle ([id number]) on 08/22/2024 5:18:37 AM  CT Abdomen Pelvis W Contrast Result Date: 08/22/2024 Exam:  CT of the Abdomen and Pelvis with Contrast  History: Epigastric pain, blunt abdominal trauma, vomiting  Technique:  Routine CT of the abdomen and pelvis with IV contrast.  AEC (automated exposure control) and/or manual techniques such as size-specific kV and mAs are employed where appropriate to reduce radiation exposure for all CT exams.  Comparison: 01/25/2023  Abdomen and Pelvis CT Findings:  LOWER THORAX:  The lung bases are clear.  HEPATOBILIARY: The liver is enlarged and diffusely fatty infiltrated, without focal lesion. The gallbladder is surgically absent  PANCREAS: Marked peripancreatic edema and induration is demonstrated suggesting acute pancreatitis. Subtle foci of hypoenhancement in the body and tail suggest pancreatic necrosis, close follow-up is warranted. Moderate ill-defined edema tracks into the porta hepatis and inferiorly into the right pericolic gutter  SPLEEN:  Normal.  ADRENALS:  Normal.  KIDNEYS/URETERS: Normal. No nephrolithiasis or hydronephrosis. Nephrograms are symmetric. No perinephric stranding.  VASCULAR: Negative  LYMPH NODES:  No adenopathy.  BOWEL/MESENTERY:  The bowel is nondilated. No bowel obstruction is evident. The appendix is normal. No additional acute finding in the abdomen or pelvis.  PELVIC ORGANS: Ovaries are prominent in size. The left ovary has decreased in size since 01/25/2023.  BONES:  No aggressive osseous lesion. No acute osseous abnormality.    1. Marked peripancreatic edema and induration consistent with acute pancreatitis. Small volume of free fluid in the right paracolic gutter and porta hepatis  2. Hypoenhancement of the pancreatic body concerning for early pancreatic necrosis  3. No additional acute finding. Chronic findings include fatty hepatomegaly, prominent appearance of the ovaries   Signed  (Electronic Signature): 08/22/2024 2:31 AM Signed By: Luke Batter   Medications Administered:  Medications  sodium chloride  (NS) 0.9 % infusion (has no administration in time range)  sodium chloride  0.9% (NS) bolus 1,000 mL (0 mL Intravenous Stopped 08/22/24 0142)  ondansetron  (ZOFRAN ) injection 4 mg (4 mg Intravenous Given 08/22/24 0042)  fentaNYL  (PF) (SUBLIMAZE ) injection 50 mcg (50 mcg Intravenous Given 08/22/24 0042)  iohexol  (OMNIPAQUE ) 300 mg iodine/mL solution 125 mL (125 mL Intravenous Given 08/22/24 0222)  magnesium  sulfate in D5W 1 gram/100 mL infusion 1 g (1 g Intravenous New Bag 08/22/24 0413)  sodium chloride  0.9% (NS) bolus 1,000 mL (1,000 mL Intravenous New Bag 08/22/24 0413)    Discharge Medications (Medications Prescribed during this  ED  visit and Patient's Home Medications) :    Your Medication List     ASK your doctor about these medications    ACCU-CHEK AVIVA PLUS TEST STRP Strp Generic drug: blood sugar diagnostic AS DIRECTED FOR CHECKING BLOOD SUGAR ONCE TO TWICE DAILY   ACCU-CHEK GUIDE TEST STRIPS Strp Generic drug: blood sugar diagnostic AS DIRECTED FOR CHECKING BLOOD SUGAR ONCE TO TWICE DAILY   aripiprazole  10 MG tablet Commonly known as: ABILIFY  Take 1 tablet (10 mg total) by mouth nightly.   aripiprazole  20 MG tablet Commonly known as: ABILIFY  Take 1 tablet (20 mg total) by mouth daily.   AUVELITY  45-105 mg Tbie Generic drug: dextromethorphan-buPROPion  TAKE 1 TABLET BY MOUTH IN THE MORNING FOR 1 WEEK THEN TAKE 1 TABLET TWICE DAILY   buPROPion  150 MG 24 hr tablet Commonly known as: Wellbutrin  XL Take 2 tablets (300 mg total) by mouth in the morning.   clonazePAM  1 MG tablet Commonly known as: KlonoPIN  Take 1 tablet (1 mg total) by mouth Three (3) times a day.   COMFORT EZ PEN NEEDLES 31 gauge x 1/4 (6 mm) Ndle Generic drug: pen needle, diabetic USE TO INJECT insulin  FOUR TIMES DAILY   dicyclomine  10 mg capsule Commonly known as: BENTYL  Take  1 capsule (10 mg total) by mouth.   DULoxetine  60 MG capsule Commonly known as: CYMBALTA  Take 2 capsules (120 mg total) by mouth.   furosemide  20 MG tablet Commonly known as: LASIX  Take 1 tablet (20 mg total) by mouth daily.   gabapentin  100 MG capsule Commonly known as: NEURONTIN  Take 3 capsules (300 mg total) by mouth at bedtime.   gabapentin  600 MG tablet Commonly known as: NEURONTIN  Take 1 tablet (600 mg total) by mouth two (2) times a day.   ibuprofen 800 MG tablet Commonly known as: MOTRIN Take 1 tablet (800 mg total) by mouth every eight (8) hours as needed for pain.   insulin  lispro 100 unit/mL injection pen Commonly known as: HumaLOG  Inject 20-26 Units under the skin.   LANTUS  SOLOSTAR U-100 INSULIN  100 unit/mL (3 mL) injection pen Generic drug: insulin  glargine Inject 0.1 mL (10 Units total) under the skin nightly.   lipase-protease-amylase (pork) 40,000-126,000- 168,000 unit Cpdr capsule, delayed release Take 2 capsules by mouth Three (3) times a day before meals.   lisinopril  5 MG tablet Commonly known as: PRINIVIL ,ZESTRIL  Take 1 tablet (5 mg total) by mouth daily.   metFORMIN  500 MG 24 hr tablet Commonly known as: GLUCOPHAGE -XR Take 1 tablet (500 mg total) by mouth daily with evening meal.   metoPROLOL  tartrate 25 MG tablet Commonly known as: Lopressor  Take 1 tablet (25 mg total) by mouth.   nitroglycerin  0.4 MG SL tablet Commonly known as: NITROSTAT  Place 1 tablet (0.4 mg total) under the tongue.   omega-3 acid ethyl esters 1 gram capsule Commonly known as: LOVAZA  TAKE FOUR CAPSULES BY MOUTH EVERY DAY   omeprazole  40 MG capsule Commonly known as: PriLOSEC Take 1 capsule (40 mg total) by mouth daily.   ondansetron  4 MG disintegrating tablet Commonly known as: ZOFRAN -ODT TAKE 1 TABLET BY MOUTH EVERY 8 HOURS AS NEEDED FOR NAUSEA AND VOMITING   OZEMPIC 0.25 mg or 0.5 mg(2 mg/1.5 mL) Pnij injection Generic drug: semaglutide Inject 0.25 mg under  the skin every seven (7) days.   pantoprazole  20 MG tablet Commonly known as: Protonix  Take 1 tablet (20 mg total) by mouth daily.   ranolazine  500 MG 12 hr tablet Commonly known as: RANEXA  Take  1 tablet (500 mg total) by mouth.   rosuvastatin  20 MG tablet Commonly known as: CRESTOR  Take 0.5 tablets (10 mg total) by mouth daily.   traZODone  100 MG tablet Commonly known as: DESYREL  Take 4 tablets (400 mg total) by mouth.   valsartan  80 MG tablet Commonly known as: DIOVAN  Take 1 tablet (80 mg total) by mouth daily.          Cherie Ardeen Hanger, MD 08/22/24 [phone]  "

## 2024-08-23 ENCOUNTER — Other Ambulatory Visit: Payer: Self-pay

## 2024-08-23 ENCOUNTER — Inpatient Hospital Stay (HOSPITAL_COMMUNITY)
Admission: EM | Admit: 2024-08-23 | Source: Other Acute Inpatient Hospital | Attending: Internal Medicine | Admitting: Internal Medicine

## 2024-08-23 ENCOUNTER — Ambulatory Visit (HOSPITAL_COMMUNITY)

## 2024-08-23 ENCOUNTER — Encounter (HOSPITAL_COMMUNITY): Payer: Self-pay | Admitting: Internal Medicine

## 2024-08-23 DIAGNOSIS — I5021 Acute systolic (congestive) heart failure: Secondary | ICD-10-CM

## 2024-08-23 DIAGNOSIS — K861 Other chronic pancreatitis: Secondary | ICD-10-CM | POA: Diagnosis present

## 2024-08-23 DIAGNOSIS — R112 Nausea with vomiting, unspecified: Secondary | ICD-10-CM | POA: Diagnosis not present

## 2024-08-23 DIAGNOSIS — R109 Unspecified abdominal pain: Secondary | ICD-10-CM | POA: Diagnosis not present

## 2024-08-23 DIAGNOSIS — I499 Cardiac arrhythmia, unspecified: Secondary | ICD-10-CM | POA: Diagnosis not present

## 2024-08-23 DIAGNOSIS — K8689 Other specified diseases of pancreas: Secondary | ICD-10-CM | POA: Diagnosis present

## 2024-08-23 DIAGNOSIS — K859 Acute pancreatitis without necrosis or infection, unspecified: Secondary | ICD-10-CM | POA: Diagnosis present

## 2024-08-23 DIAGNOSIS — R1114 Bilious vomiting: Secondary | ICD-10-CM

## 2024-08-23 DIAGNOSIS — E781 Pure hyperglyceridemia: Secondary | ICD-10-CM | POA: Diagnosis present

## 2024-08-23 DIAGNOSIS — I10 Essential (primary) hypertension: Secondary | ICD-10-CM | POA: Diagnosis present

## 2024-08-23 DIAGNOSIS — G4733 Obstructive sleep apnea (adult) (pediatric): Secondary | ICD-10-CM

## 2024-08-23 DIAGNOSIS — K219 Gastro-esophageal reflux disease without esophagitis: Secondary | ICD-10-CM | POA: Diagnosis present

## 2024-08-23 DIAGNOSIS — I2510 Atherosclerotic heart disease of native coronary artery without angina pectoris: Secondary | ICD-10-CM | POA: Insufficient documentation

## 2024-08-23 DIAGNOSIS — J9601 Acute respiratory failure with hypoxia: Secondary | ICD-10-CM

## 2024-08-23 DIAGNOSIS — E119 Type 2 diabetes mellitus without complications: Secondary | ICD-10-CM

## 2024-08-23 DIAGNOSIS — R1084 Generalized abdominal pain: Secondary | ICD-10-CM

## 2024-08-23 DIAGNOSIS — Z7401 Bed confinement status: Secondary | ICD-10-CM | POA: Diagnosis not present

## 2024-08-23 DIAGNOSIS — Z743 Need for continuous supervision: Secondary | ICD-10-CM | POA: Diagnosis not present

## 2024-08-23 HISTORY — DX: Atherosclerotic heart disease of native coronary artery without angina pectoris: I25.10

## 2024-08-23 HISTORY — DX: Fatty (change of) liver, not elsewhere classified: K76.0

## 2024-08-23 LAB — GLUCOSE, CAPILLARY: Glucose-Capillary: 326 mg/dL — ABNORMAL HIGH (ref 70–99)

## 2024-08-23 MED ORDER — MUPIROCIN 2 % EX OINT
1.0000 | TOPICAL_OINTMENT | Freq: Two times a day (BID) | CUTANEOUS | Status: AC
  Administered 2024-08-24 – 2024-08-28 (×9): 1 via NASAL
  Filled 2024-08-23 (×2): qty 22

## 2024-08-23 MED ORDER — LACTATED RINGERS IV SOLN: INTRAVENOUS | Status: DC

## 2024-08-23 MED ORDER — LACTATED RINGERS IV BOLUS
1000.0000 mL | Freq: Once | INTRAVENOUS | Status: AC
  Administered 2024-08-24: 1000 mL via INTRAVENOUS

## 2024-08-23 NOTE — ED Notes (Signed)
 Patient cleaned and washed herself of stool. She was short of breathe afterwards. Patient was placed on oxygen  at 4 liters [place]

## 2024-08-23 NOTE — ED Notes (Signed)
 Pt accepted to [facility] Room: 5 [facility] 12C Report: [phone]

## 2024-08-24 ENCOUNTER — Encounter (HOSPITAL_COMMUNITY): Payer: Self-pay | Admitting: Internal Medicine

## 2024-08-24 DIAGNOSIS — G4733 Obstructive sleep apnea (adult) (pediatric): Secondary | ICD-10-CM

## 2024-08-24 DIAGNOSIS — K859 Acute pancreatitis without necrosis or infection, unspecified: Secondary | ICD-10-CM | POA: Diagnosis not present

## 2024-08-24 DIAGNOSIS — K8500 Idiopathic acute pancreatitis without necrosis or infection: Secondary | ICD-10-CM

## 2024-08-24 DIAGNOSIS — R933 Abnormal findings on diagnostic imaging of other parts of digestive tract: Secondary | ICD-10-CM

## 2024-08-24 DIAGNOSIS — E1110 Type 2 diabetes mellitus with ketoacidosis without coma: Secondary | ICD-10-CM | POA: Diagnosis not present

## 2024-08-24 DIAGNOSIS — Z905 Acquired absence of kidney: Secondary | ICD-10-CM | POA: Diagnosis not present

## 2024-08-24 DIAGNOSIS — R1084 Generalized abdominal pain: Secondary | ICD-10-CM

## 2024-08-24 DIAGNOSIS — E781 Pure hyperglyceridemia: Secondary | ICD-10-CM | POA: Diagnosis not present

## 2024-08-24 DIAGNOSIS — J9601 Acute respiratory failure with hypoxia: Secondary | ICD-10-CM | POA: Diagnosis not present

## 2024-08-24 DIAGNOSIS — I10 Essential (primary) hypertension: Secondary | ICD-10-CM

## 2024-08-24 DIAGNOSIS — G4730 Sleep apnea, unspecified: Secondary | ICD-10-CM | POA: Diagnosis not present

## 2024-08-24 DIAGNOSIS — I2510 Atherosclerotic heart disease of native coronary artery without angina pectoris: Secondary | ICD-10-CM | POA: Insufficient documentation

## 2024-08-24 DIAGNOSIS — E1169 Type 2 diabetes mellitus with other specified complication: Secondary | ICD-10-CM

## 2024-08-24 DIAGNOSIS — R1114 Bilious vomiting: Secondary | ICD-10-CM

## 2024-08-24 LAB — BLOOD GAS, ARTERIAL
Acid-base deficit: 5.9 mmol/L — ABNORMAL HIGH (ref 0.0–2.0)
Bicarbonate: 20.7 mmol/L (ref 20.0–28.0)
O2 Saturation: 94.8 %
Patient temperature: 36.7
pCO2 arterial: 43 mmHg (ref 32–48)
pH, Arterial: 7.28 — ABNORMAL LOW (ref 7.35–7.45)
pO2, Arterial: 80 mmHg — ABNORMAL LOW (ref 83–108)

## 2024-08-24 LAB — BASIC METABOLIC PANEL WITH GFR
Anion gap: 11 (ref 5–15)
Anion gap: 13 (ref 5–15)
Anion gap: 15 (ref 5–15)
Anion gap: 17 — ABNORMAL HIGH (ref 5–15)
Anion gap: 22 — ABNORMAL HIGH (ref 5–15)
BUN: 20 mg/dL (ref 6–20)
BUN: 25 mg/dL — ABNORMAL HIGH (ref 6–20)
BUN: 26 mg/dL — ABNORMAL HIGH (ref 6–20)
BUN: 27 mg/dL — ABNORMAL HIGH (ref 6–20)
BUN: 27 mg/dL — ABNORMAL HIGH (ref 6–20)
CO2: 17 mmol/L — ABNORMAL LOW (ref 22–32)
CO2: 20 mmol/L — ABNORMAL LOW (ref 22–32)
CO2: 21 mmol/L — ABNORMAL LOW (ref 22–32)
CO2: 23 mmol/L (ref 22–32)
CO2: 25 mmol/L (ref 22–32)
Calcium: 7.5 mg/dL — ABNORMAL LOW (ref 8.9–10.3)
Calcium: 7.9 mg/dL — ABNORMAL LOW (ref 8.9–10.3)
Calcium: 8 mg/dL — ABNORMAL LOW (ref 8.9–10.3)
Calcium: 8 mg/dL — ABNORMAL LOW (ref 8.9–10.3)
Calcium: 8 mg/dL — ABNORMAL LOW (ref 8.9–10.3)
Chloride: 101 mmol/L (ref 98–111)
Chloride: 102 mmol/L (ref 98–111)
Chloride: 103 mmol/L (ref 98–111)
Chloride: 104 mmol/L (ref 98–111)
Chloride: 99 mmol/L (ref 98–111)
Creatinine, Ser: 0.86 mg/dL (ref 0.44–1.00)
Creatinine, Ser: 0.87 mg/dL (ref 0.44–1.00)
Creatinine, Ser: 0.93 mg/dL (ref 0.44–1.00)
Creatinine, Ser: 0.97 mg/dL (ref 0.44–1.00)
Creatinine, Ser: 0.98 mg/dL (ref 0.44–1.00)
GFR, Estimated: >60 mL/min
GFR, Estimated: >60 mL/min
GFR, Estimated: >60 mL/min
GFR, Estimated: >60 mL/min
GFR, Estimated: >60 mL/min
Glucose, Bld: 132 mg/dL — ABNORMAL HIGH (ref 70–99)
Glucose, Bld: 235 mg/dL — ABNORMAL HIGH (ref 70–99)
Glucose, Bld: 239 mg/dL — ABNORMAL HIGH (ref 70–99)
Glucose, Bld: 287 mg/dL — ABNORMAL HIGH (ref 70–99)
Glucose, Bld: 327 mg/dL — ABNORMAL HIGH (ref 70–99)
Potassium: 4.2 mmol/L (ref 3.5–5.1)
Potassium: 4.2 mmol/L (ref 3.5–5.1)
Potassium: 4.3 mmol/L (ref 3.5–5.1)
Potassium: 4.6 mmol/L (ref 3.5–5.1)
Potassium: 4.7 mmol/L (ref 3.5–5.1)
Sodium: 137 mmol/L (ref 135–145)
Sodium: 138 mmol/L (ref 135–145)
Sodium: 138 mmol/L (ref 135–145)
Sodium: 139 mmol/L (ref 135–145)
Sodium: 140 mmol/L (ref 135–145)

## 2024-08-24 LAB — CBC WITH DIFFERENTIAL/PLATELET
Abs Immature Granulocytes: 0.18 10*3/uL — ABNORMAL HIGH (ref 0.00–0.07)
Basophils Absolute: 0.1 10*3/uL (ref 0.0–0.1)
Basophils Relative: 1 %
Eosinophils Absolute: 0 10*3/uL (ref 0.0–0.5)
Eosinophils Relative: 0 %
HCT: 41.5 % (ref 36.0–46.0)
Hemoglobin: 13.5 g/dL (ref 12.0–15.0)
Immature Granulocytes: 2 %
Lymphocytes Relative: 8 %
Lymphs Abs: 0.9 10*3/uL (ref 0.7–4.0)
MCH: 29 pg (ref 26.0–34.0)
MCHC: 32.5 g/dL (ref 30.0–36.0)
MCV: 89.2 fL (ref 80.0–100.0)
Monocytes Absolute: 0.9 10*3/uL (ref 0.1–1.0)
Monocytes Relative: 8 %
Neutro Abs: 8.4 10*3/uL — ABNORMAL HIGH (ref 1.7–7.7)
Neutrophils Relative %: 81 %
Platelets: 151 10*3/uL (ref 150–400)
RBC: 4.65 MIL/uL (ref 3.87–5.11)
RDW: 16.8 % — ABNORMAL HIGH (ref 11.5–15.5)
Smear Review: NORMAL
WBC: 10.4 10*3/uL (ref 4.0–10.5)
nRBC: 0 % (ref 0.0–0.2)

## 2024-08-24 LAB — CBC
HCT: 39.5 % (ref 36.0–46.0)
Hemoglobin: 12.8 g/dL (ref 12.0–15.0)
MCH: 29.1 pg (ref 26.0–34.0)
MCHC: 32.4 g/dL (ref 30.0–36.0)
MCV: 89.8 fL (ref 80.0–100.0)
Platelets: 160 10*3/uL (ref 150–400)
RBC: 4.4 MIL/uL (ref 3.87–5.11)
RDW: 16.9 % — ABNORMAL HIGH (ref 11.5–15.5)
WBC: 9.2 10*3/uL (ref 4.0–10.5)
nRBC: 0 % (ref 0.0–0.2)

## 2024-08-24 LAB — GLUCOSE, CAPILLARY
Glucose-Capillary: 284 mg/dL — ABNORMAL HIGH (ref 70–99)
Glucose-Capillary: 269 mg/dL — ABNORMAL HIGH (ref 70–99)
Glucose-Capillary: 276 mg/dL — ABNORMAL HIGH (ref 70–99)
Glucose-Capillary: 268 mg/dL — ABNORMAL HIGH (ref 70–99)
Glucose-Capillary: 265 mg/dL — ABNORMAL HIGH (ref 70–99)
Glucose-Capillary: 246 mg/dL — ABNORMAL HIGH (ref 70–99)
Glucose-Capillary: 226 mg/dL — ABNORMAL HIGH (ref 70–99)
Glucose-Capillary: 194 mg/dL — ABNORMAL HIGH (ref 70–99)
Glucose-Capillary: 218 mg/dL — ABNORMAL HIGH (ref 70–99)
Glucose-Capillary: 151 mg/dL — ABNORMAL HIGH (ref 70–99)
Glucose-Capillary: 133 mg/dL — ABNORMAL HIGH (ref 70–99)
Glucose-Capillary: 144 mg/dL — ABNORMAL HIGH (ref 70–99)
Glucose-Capillary: 95 mg/dL (ref 70–99)
Glucose-Capillary: 127 mg/dL — ABNORMAL HIGH (ref 70–99)

## 2024-08-24 LAB — BETA-HYDROXYBUTYRIC ACID
Beta-Hydroxybutyric Acid: 4.9 mmol/L — ABNORMAL HIGH (ref 0.05–0.27)
Beta-Hydroxybutyric Acid: 3.42 mmol/L — ABNORMAL HIGH (ref 0.05–0.27)
Beta-Hydroxybutyric Acid: 2.1 mmol/L — ABNORMAL HIGH (ref 0.05–0.27)
Beta-Hydroxybutyric Acid: 1.47 mmol/L — ABNORMAL HIGH (ref 0.05–0.27)
Beta-Hydroxybutyric Acid: 0.28 mmol/L — ABNORMAL HIGH (ref 0.05–0.27)

## 2024-08-24 LAB — TYPE AND SCREEN
ABO/RH(D): O NEG
Antibody Screen: NEGATIVE

## 2024-08-24 LAB — HEPATIC FUNCTION PANEL
ALT: 22 U/L (ref 0–44)
AST: 39 U/L (ref 15–41)
Albumin: 3.6 g/dL (ref 3.5–5.0)
Alkaline Phosphatase: 69 U/L (ref 38–126)
Bilirubin, Direct: 0.4 mg/dL — ABNORMAL HIGH (ref 0.0–0.2)
Indirect Bilirubin: 0.4 mg/dL (ref 0.3–0.9)
Total Bilirubin: 0.8 mg/dL (ref 0.0–1.2)
Total Protein: 7.1 g/dL (ref 6.5–8.1)

## 2024-08-24 LAB — COMPREHENSIVE METABOLIC PANEL WITH GFR
ALT: 21 U/L (ref 0–44)
AST: 42 U/L — ABNORMAL HIGH (ref 15–41)
Albumin: 3.2 g/dL — ABNORMAL LOW (ref 3.5–5.0)
Alkaline Phosphatase: 84 U/L (ref 38–126)
Anion gap: 23 — ABNORMAL HIGH (ref 5–15)
BUN: 22 mg/dL — ABNORMAL HIGH (ref 6–20)
CO2: 14 mmol/L — ABNORMAL LOW (ref 22–32)
Calcium: 7.6 mg/dL — ABNORMAL LOW (ref 8.9–10.3)
Chloride: 99 mmol/L (ref 98–111)
Creatinine, Ser: 0.9 mg/dL (ref 0.44–1.00)
GFR, Estimated: >60 mL/min
Glucose, Bld: 299 mg/dL — ABNORMAL HIGH (ref 70–99)
Potassium: 5.2 mmol/L — ABNORMAL HIGH (ref 3.5–5.1)
Sodium: 136 mmol/L (ref 135–145)
Total Bilirubin: 0.7 mg/dL (ref 0.0–1.2)
Total Protein: 6.7 g/dL (ref 6.5–8.1)

## 2024-08-24 LAB — LACTIC ACID, PLASMA
Lactic Acid, Venous: 1.2 mmol/L (ref 0.5–1.9)
Lactic Acid, Venous: 1.5 mmol/L (ref 0.5–1.9)

## 2024-08-24 LAB — LIPASE, BLOOD: Lipase: 262 U/L — ABNORMAL HIGH (ref 11–51)

## 2024-08-24 LAB — TSH: TSH: 1.17 u[IU]/mL (ref 0.350–4.500)

## 2024-08-24 LAB — MAGNESIUM: Magnesium: 2.1 mg/dL (ref 1.7–2.4)

## 2024-08-24 LAB — C-REACTIVE PROTEIN: CRP: 36.6 mg/dL — ABNORMAL HIGH

## 2024-08-24 LAB — TROPONIN T, HIGH SENSITIVITY
Troponin T High Sensitivity: 8 ng/L (ref 0–19)
Troponin T High Sensitivity: 9 ng/L (ref 0–19)

## 2024-08-24 LAB — HIV ANTIBODY (ROUTINE TESTING W REFLEX): HIV Screen 4th Generation wRfx: NONREACTIVE

## 2024-08-24 LAB — TRIGLYCERIDES: Triglycerides: 315 mg/dL — ABNORMAL HIGH

## 2024-08-24 LAB — ABO/RH: ABO/RH(D): O NEG

## 2024-08-24 MED ORDER — INSULIN ASPART 100 UNIT/ML IJ SOLN
0.0000 [IU] | INTRAMUSCULAR | Status: AC
  Administered 2024-08-25: 3 [IU] via SUBCUTANEOUS
  Administered 2024-08-25: 4 [IU] via SUBCUTANEOUS
  Administered 2024-08-25: 7 [IU] via SUBCUTANEOUS
  Administered 2024-08-25 (×2): 4 [IU] via SUBCUTANEOUS
  Administered 2024-08-25 – 2024-08-26 (×2): 7 [IU] via SUBCUTANEOUS
  Administered 2024-08-26 (×2): 11 [IU] via SUBCUTANEOUS
  Administered 2024-08-26 (×4): 7 [IU] via SUBCUTANEOUS
  Administered 2024-08-27: 4 [IU] via SUBCUTANEOUS
  Administered 2024-08-27: 7 [IU] via SUBCUTANEOUS
  Administered 2024-08-27 (×2): 3 [IU] via SUBCUTANEOUS
  Administered 2024-08-27 – 2024-08-28 (×3): 4 [IU] via SUBCUTANEOUS
  Administered 2024-08-28: 7 [IU] via SUBCUTANEOUS
  Administered 2024-08-28 (×3): 4 [IU] via SUBCUTANEOUS
  Administered 2024-08-28: 3 [IU] via SUBCUTANEOUS
  Administered 2024-08-29: 7 [IU] via SUBCUTANEOUS
  Administered 2024-08-29 (×2): 4 [IU] via SUBCUTANEOUS
  Administered 2024-08-29 (×2): 7 [IU] via SUBCUTANEOUS
  Administered 2024-08-30 (×2): 4 [IU] via SUBCUTANEOUS
  Administered 2024-08-30: 7 [IU] via SUBCUTANEOUS
  Administered 2024-08-30 (×3): 4 [IU] via SUBCUTANEOUS
  Administered 2024-08-31 (×3): 3 [IU] via SUBCUTANEOUS
  Administered 2024-08-31: 4 [IU] via SUBCUTANEOUS
  Administered 2024-08-31 (×2): 3 [IU] via SUBCUTANEOUS
  Filled 2024-08-24: qty 7
  Filled 2024-08-24: qty 3
  Filled 2024-08-24 (×4): qty 7
  Filled 2024-08-24: qty 4
  Filled 2024-08-24: qty 7
  Filled 2024-08-24: qty 3
  Filled 2024-08-24: qty 4
  Filled 2024-08-24: qty 7
  Filled 2024-08-24: qty 4
  Filled 2024-08-24: qty 3
  Filled 2024-08-24: qty 7
  Filled 2024-08-24: qty 4
  Filled 2024-08-24: qty 7
  Filled 2024-08-24 (×3): qty 3
  Filled 2024-08-24 (×2): qty 4
  Filled 2024-08-24: qty 11
  Filled 2024-08-24 (×3): qty 4
  Filled 2024-08-24: qty 3
  Filled 2024-08-24 (×2): qty 4
  Filled 2024-08-24: qty 7
  Filled 2024-08-24: qty 11
  Filled 2024-08-24: qty 7
  Filled 2024-08-24 (×3): qty 4
  Filled 2024-08-24: qty 11
  Filled 2024-08-24: qty 7
  Filled 2024-08-24: qty 3
  Filled 2024-08-24 (×2): qty 4
  Filled 2024-08-24: qty 3

## 2024-08-24 MED ORDER — ROSUVASTATIN CALCIUM 5 MG PO TABS
10.0000 mg | ORAL_TABLET | Freq: Every day | ORAL | Status: AC
  Administered 2024-08-24 – 2024-08-31 (×7): 10 mg via ORAL
  Filled 2024-08-24 (×3): qty 1
  Filled 2024-08-24 (×4): qty 2

## 2024-08-24 MED ORDER — INSULIN ASPART 100 UNIT/ML IJ SOLN
20.0000 [IU] | Freq: Three times a day (TID) | INTRAMUSCULAR | Status: DC
  Filled 2024-08-24: qty 1

## 2024-08-24 MED ORDER — INSULIN GLARGINE-YFGN 100 UNIT/ML ~~LOC~~ SOLN
3.0000 [IU] | Freq: Once | SUBCUTANEOUS | Status: AC
  Administered 2024-08-24: 3 [IU] via SUBCUTANEOUS
  Filled 2024-08-24: qty 0.03

## 2024-08-24 MED ORDER — ARIPIPRAZOLE 5 MG PO TABS
20.0000 mg | ORAL_TABLET | Freq: Every day | ORAL | Status: DC
  Administered 2024-08-24: 20 mg via ORAL
  Filled 2024-08-24 (×3): qty 4

## 2024-08-24 MED ORDER — AMLODIPINE BESYLATE 5 MG PO TABS: 5.0000 mg | ORAL_TABLET | Freq: Every day | ORAL | Status: DC

## 2024-08-24 MED ORDER — OXYCODONE HCL 5 MG PO TABS: 5.0000 mg | ORAL_TABLET | Freq: Four times a day (QID) | ORAL | Status: DC | PRN

## 2024-08-24 MED ORDER — TRAZODONE HCL 50 MG PO TABS
400.0000 mg | ORAL_TABLET | Freq: Every day | ORAL | Status: DC
  Administered 2024-08-24: 400 mg via ORAL
  Filled 2024-08-24 (×2): qty 8

## 2024-08-24 MED ORDER — INSULIN REGULAR(HUMAN) IN NACL 100-0.9 UT/100ML-% IV SOLN
INTRAVENOUS | Status: DC
  Administered 2024-08-24: 15 [IU]/h via INTRAVENOUS
  Administered 2024-08-24: 11 [IU]/h via INTRAVENOUS
  Filled 2024-08-24 (×2): qty 100

## 2024-08-24 MED ORDER — DEXTROSE IN LACTATED RINGERS 5 % IV SOLN: INTRAVENOUS | Status: DC

## 2024-08-24 MED ORDER — RANOLAZINE ER 500 MG PO TB12
500.0000 mg | ORAL_TABLET | Freq: Two times a day (BID) | ORAL | Status: AC
  Administered 2024-08-24 – 2024-08-31 (×15): 500 mg via ORAL
  Filled 2024-08-24 (×18): qty 1

## 2024-08-24 MED ORDER — HYDROMORPHONE HCL 1 MG/ML IJ SOLN
0.5000 mg | INTRAMUSCULAR | Status: DC | PRN
  Administered 2024-08-24: 0.5 mg via INTRAVENOUS
  Filled 2024-08-24: qty 0.5

## 2024-08-24 MED ORDER — NALOXONE HCL 0.4 MG/ML IJ SOLN: 0.4000 mg | INTRAMUSCULAR | Status: AC | PRN

## 2024-08-24 MED ORDER — METOPROLOL TARTRATE 25 MG PO TABS
25.0000 mg | ORAL_TABLET | Freq: Two times a day (BID) | ORAL | Status: AC
  Administered 2024-08-24 – 2024-08-31 (×14): 25 mg via ORAL
  Filled 2024-08-24 (×16): qty 1

## 2024-08-24 MED ORDER — OMEGA-3-ACID ETHYL ESTERS 1 G PO CAPS
2.0000 g | ORAL_CAPSULE | Freq: Two times a day (BID) | ORAL | Status: AC
  Administered 2024-08-24 – 2024-08-31 (×13): 2 g via ORAL
  Filled 2024-08-24 (×16): qty 2

## 2024-08-24 MED ORDER — LACTATED RINGERS IV SOLN: INTRAVENOUS | Status: AC

## 2024-08-24 MED ORDER — CALCIUM GLUCONATE-NACL 2-0.675 GM/100ML-% IV SOLN
2.0000 g | Freq: Once | INTRAVENOUS | Status: AC
  Administered 2024-08-24: 2000 mg via INTRAVENOUS
  Filled 2024-08-24: qty 100

## 2024-08-24 MED ORDER — DULOXETINE HCL 60 MG PO CPEP
120.0000 mg | ORAL_CAPSULE | Freq: Every day | ORAL | Status: DC
  Administered 2024-08-24: 120 mg via ORAL
  Filled 2024-08-24: qty 2

## 2024-08-24 MED ORDER — DICYCLOMINE HCL 10 MG PO CAPS: 10.0000 mg | ORAL_CAPSULE | Freq: Two times a day (BID) | ORAL | Status: DC | PRN

## 2024-08-24 MED ORDER — FENOFIBRATE 160 MG PO TABS
160.0000 mg | ORAL_TABLET | Freq: Every day | ORAL | Status: AC
  Administered 2024-08-24 – 2024-08-31 (×7): 160 mg via ORAL
  Filled 2024-08-24 (×8): qty 1

## 2024-08-24 MED ORDER — LACTATED RINGERS IV BOLUS
1000.0000 mL | Freq: Once | INTRAVENOUS | Status: AC
  Administered 2024-08-24: 1000 mL via INTRAVENOUS

## 2024-08-24 MED ORDER — CLONAZEPAM 0.5 MG PO TABS
2.0000 mg | ORAL_TABLET | Freq: Every day | ORAL | Status: DC
  Administered 2024-08-24 (×2): 2 mg via ORAL
  Filled 2024-08-24 (×2): qty 4

## 2024-08-24 MED ORDER — DEXTROSE 50 % IV SOLN: 0.0000 mL | INTRAVENOUS | Status: AC | PRN

## 2024-08-24 MED ORDER — HEPARIN SODIUM (PORCINE) 5000 UNIT/ML IJ SOLN
5000.0000 [IU] | Freq: Three times a day (TID) | INTRAMUSCULAR | Status: AC
  Administered 2024-08-24 – 2024-08-31 (×24): 5000 [IU] via SUBCUTANEOUS
  Filled 2024-08-24 (×24): qty 1

## 2024-08-24 MED ORDER — FENTANYL CITRATE (PF) 50 MCG/ML IJ SOSY: 12.5000 ug | PREFILLED_SYRINGE | INTRAMUSCULAR | Status: AC | PRN

## 2024-08-24 MED ORDER — LOPERAMIDE HCL 2 MG PO CAPS
2.0000 mg | ORAL_CAPSULE | ORAL | Status: AC | PRN
  Administered 2024-08-24 – 2024-08-27 (×4): 2 mg via ORAL
  Filled 2024-08-24 (×4): qty 1

## 2024-08-24 MED ORDER — PANTOPRAZOLE SODIUM 40 MG PO TBEC
40.0000 mg | DELAYED_RELEASE_TABLET | Freq: Every day | ORAL | Status: AC
  Administered 2024-08-24 – 2024-08-31 (×7): 40 mg via ORAL
  Filled 2024-08-24 (×8): qty 1

## 2024-08-24 MED ORDER — GABAPENTIN 300 MG PO CAPS
600.0000 mg | ORAL_CAPSULE | Freq: Two times a day (BID) | ORAL | Status: DC
  Administered 2024-08-24 (×3): 600 mg via ORAL
  Filled 2024-08-24 (×6): qty 2

## 2024-08-24 MED ORDER — INSULIN ASPART 100 UNIT/ML IJ SOLN
0.0000 [IU] | INTRAMUSCULAR | Status: DC
  Administered 2024-08-24: 5 [IU] via SUBCUTANEOUS
  Filled 2024-08-24: qty 4
  Filled 2024-08-24: qty 1

## 2024-08-24 NOTE — Plan of Care (Signed)

## 2024-08-24 NOTE — Progress Notes (Signed)
 Received 0.5 mg of Dilaudid  for MRI-still could not lie flat-very lethargic currently-but awakes-pupils are not pinpoint.  Follows commands-and then goes back to sleep.  Checking ABG-stop Dilaudid -maybe switch to fentanyl  for intractable pain-keep n.p.o. for now-has OSA-have asked RT to start BiPAP as needed.

## 2024-08-24 NOTE — Progress Notes (Signed)
ABG obtained and sent to lab. °

## 2024-08-24 NOTE — Progress Notes (Signed)
 Patient not able to participate in CM assessment today. On Bipap due to being lethargic, DKA, insulin  gtt.  ICM will follow up with patient as appropriate.

## 2024-08-24 NOTE — Progress Notes (Signed)
 "                        PROGRESS NOTE        PATIENT DETAILS Name: Jamie [name] Age: 54 y.o. Sex: female Date of Birth: [birth year] Admit Date: 08/23/2024 Admitting Physician Redia LOISE Cleaver, MD ERE:Anbi, Amy H, PA-C  Brief Summary: Patient is a 54 y.o.  female with GF DM-2, HTN, prior episodes of pancreatitis-presented to [facility] with abdominal pain-found to have acute pancreatitis with necrosis.  Subsequently transferred to [facility] for further evaluation.  Significant events: 1/27>> presented to ED at [facility] ER for abdominal pain-found to have acute pancreatitis with early necrosis. 1/30>> transferred to [facility] admit to [facility]-blood work consistent with DKA.  Started on insulin  infusion.  GI consulted.  Significant studies: 1/28>> CT abdomen/pelvis (at [facility]):marked peripancreatic edema and induration consistent with acute pancreatitis-concerning for early pancreatic necrosis.  Significant microbiology data: None  Procedures: None  Consults: GI  Subjective: Does not feel good this morning but is awake/alert-continues to have abdominal pain.  Objective: Vitals: Blood pressure 108/65, pulse (!) 113, temperature 97.8 F (36.6 C), temperature source Oral, resp. rate (!) 24, height 5' 3 (1.6 m), weight 109 kg, SpO2 95%.   Exam: Gen Exam: Awake/alert-acutely sick appearing but not in any distress. HEENT:atraumatic, normocephalic Chest: B/L clear to auscultation anteriorly CVS:S1S2 regular Abdomen: Soft-tender in the epigastric/mid abdominal area. Extremities:no edema Neurology: Non focal Skin: no rash  Pertinent Labs/Radiology:    Latest Ref Rng & Units 08/24/2024    5:36 AM 08/24/2024   12:10 AM 09/19/2023   11:18 AM  CBC  WBC 4.0 - 10.5 K/uL 9.2  10.4  7.1   Hemoglobin 12.0 - 15.0 g/dL 87.1  86.4  86.0   Hematocrit 36.0 - 46.0 % 39.5  41.5  43.4   Platelets 150 - 400 K/uL 160  151  147     Lab Results  Component Value Date   NA 136 08/24/2024   K 5.2 (H)  08/24/2024   CL 99 08/24/2024   CO2 14 (L) 08/24/2024      Assessment/Plan: Acute pancreatitis with possible early necrosis Unclear etiology-s/p cholecystectomy-denies EtOH use-triglyceride levels less than 500. Continues to have abdominal pain Continue IVF As needed antiemetics/narcotics Await MRCP Await GI input.  DKA Likely secondary to not getting insulin -while waiting in the ED at [facility] ER Start insulin  infusion Follow electrolytes  DM-2 with uncontrolled hyperglycemia Currently in DKA-and on insulin  infusion. Resume SQ insulin  when able.  HTN BP noted to be stable Continue metoprolol -hold amlodipine  Follow and adjust.  HLD/hypertriglyceridemia Statin/fenofibrate  Triglycerides 315-IV insulin  infusion would help.  Depression/anxiety/bipolar disorder Continue Abilify /Cymbalta /trazodone   Peripheral neuropathy Gabapentin   OSA CPAP  GERD PPI  Class 3 Obesity: Estimated body mass index is 42.57 kg/m as calculated from the following:   Height as of this encounter: 5' 3 (1.6 m).   Weight as of this encounter: 109 kg.   Code status:   Code Status: Full Code   DVT Prophylaxis: heparin  injection 5,000 Units Start: 08/24/24 0600 SCDs Start: 08/24/24 0124   Family Communication: None at bedside   Disposition Plan: Status is: Inpatient Remains inpatient appropriate because: Severity of illness   Planned Discharge Destination:Home   Diet: Diet Order             Diet clear liquid Room service appropriate? Yes; Fluid consistency: Thin  Diet effective now  Antimicrobial agents: Anti-infectives (From admission, onward)    None        MEDICATIONS: Scheduled Meds:  amLODipine   5 mg Oral Daily   ARIPiprazole   20 mg Oral Daily   clonazePAM   2 mg Oral QHS   DULoxetine   120 mg Oral QHS   fenofibrate   160 mg Oral Daily   gabapentin   600 mg Oral BID   heparin   5,000 Units Subcutaneous Q8H   metoprolol  tartrate  25 mg Oral  BID   mupirocin  ointment  1 Application Nasal BID   omega-3 acid ethyl esters  2 g Oral BID   pantoprazole   40 mg Oral Daily   ranolazine   500 mg Oral BID   rosuvastatin   10 mg Oral QHS   traZODone   400 mg Oral QHS   Continuous Infusions:  dextrose  5% lactated ringers      insulin      lactated ringers      PRN Meds:.dextrose , dicyclomine , HYDROmorphone  (DILAUDID ) injection, loperamide    I have personally reviewed following labs and imaging studies  LABORATORY DATA: CBC: Recent Labs  Lab 08/24/24 0010 08/24/24 0536  WBC 10.4 9.2  NEUTROABS 8.4*  --   HGB 13.5 12.8  HCT 41.5 39.5  MCV 89.2 89.8  PLT 151 160    Basic Metabolic Panel: Recent Labs  Lab 08/24/24 0010 08/24/24 0537 08/24/24 0619  NA 137 136  --   K 4.3 5.2*  --   CL 99 99  --   CO2 17* 14*  --   GLUCOSE 327* 299*  --   BUN 20 22*  --   CREATININE 0.98 0.90  --   CALCIUM  7.5* 7.6*  --   MG  --   --  2.1    GFR: Estimated Creatinine Clearance: 86.6 mL/min (by C-G formula based on SCr of 0.9 mg/dL).  Liver Function Tests: Recent Labs  Lab 08/24/24 0010 08/24/24 0537  AST 39 42*  ALT 22 21  ALKPHOS 69 84  BILITOT 0.8 0.7  PROT 7.1 6.7  ALBUMIN  3.6 3.2*   Recent Labs  Lab 08/24/24 0010  LIPASE 262*   No results for input(s): AMMONIA in the last 168 hours.  Coagulation Profile: No results for input(s): INR, PROTIME in the last 168 hours.  Cardiac Enzymes: No results for input(s): CKTOTAL, CKMB, CKMBINDEX, TROPONINI in the last 168 hours.  BNP (last 3 results) No results for input(s): PROBNP in the last 8760 hours.  Lipid Profile: Recent Labs    08/24/24 0010  TRIG 315*    Thyroid  Function Tests: Recent Labs    08/24/24 0010  TSH 1.170    Anemia Panel: No results for input(s): VITAMINB12, FOLATE, FERRITIN, TIBC, IRON, RETICCTPCT in the last 72 hours.  Urine analysis:    Component Value Date/Time   COLORURINE YELLOW 05/25/2023 1521    APPEARANCEUR CLEAR 05/25/2023 1521   APPEARANCEUR Cloudy 09/16/2011 1746   LABSPEC 1.023 05/25/2023 1521   LABSPEC 1.025 09/16/2011 1746   PHURINE 5.5 05/25/2023 1521   GLUCOSEU 3+ (A) 05/25/2023 1521   GLUCOSEU Negative 09/16/2011 1746   HGBUR NEGATIVE 05/25/2023 1521   BILIRUBINUR SMALL (A) 01/26/2023 1800   BILIRUBINUR Negative 09/16/2011 1746   KETONESUR TRACE (A) 05/25/2023 1521   PROTEINUR NEGATIVE 05/25/2023 1521   UROBILINOGEN 0.2 11/20/2007 1843   NITRITE NEGATIVE 05/25/2023 1521   LEUKOCYTESUR 1+ (A) 05/25/2023 1521   LEUKOCYTESUR 2+ 09/16/2011 1746    Sepsis Labs: Lactic Acid, Venous    Component Value Date/Time  LATICACIDVEN 1.2 08/24/2024 0536    MICROBIOLOGY: No results found for this or any previous visit (from the past 240 hours).  RADIOLOGY STUDIES/RESULTS: No results found.   LOS: 1 day   Donalda Applebaum, MD  [facility]    To contact the attending provider between 7A-7P or the covering provider during after hours 7P-7A, please log into the web site [email or web address] and access using universal [place] password for that web site. If you do not have the password, please call the hospital operator.  08/24/2024, 9:41 AM    "

## 2024-08-24 NOTE — Progress Notes (Signed)
 " PROGRESS NOTE    Jamie [name]  FMW:[id number] DOB: August 21, 1970 DOA: 08/23/2024 PCP: Jolee Greig DEL, PA-C    CC: Abdominal Pain due to Acute Pancreatitis   Brief Narrative:  Jamie [name] is a 54 y/o female, with a history of recurrent pancreatitis T2DM, HTN, prior ozempic use, cholecystectomy, presenting with epigastric pain, diarrhea and nausea. Originally went to [facility] for treatment, but due to changes of necrosis on CT scan, decided to be transferred to [facility] .  In ED, lipase was 1800, AST was 41, ALT 39, BGL 327, Triglycerides at 315, bicarb of 17, and ketones and protein present in urine  CT Abdomen showed marked peripancreatic edema and induration, free fluid in paracolic gutter, fatty hepatomegaly, and hypoenhancement of pancreatic body concerning for early pancreatic necrosis. They also noted distended mild epigastric tenderness with no guarding or rigidity. EKG Sinus tachycardia   Assessment & Plan:   Acute pancreatitis Chronic pancreatitis (HCC) Lipase 262 Awaiting MCRP Continue fluids, nausea medications and pain relief meds.    DKA in setting of DM2 (diabetes mellitus, type 2) (HCC) BGL consistently above 250, bicarb of 14, betahydroxybutyric acid at 4.90, urine ketones at 20 Continue SSI Monitor beta-hydroxybutyric acid and BGL Insulin  drip started    Essential hypertension BP stable at ~125/80 Continue lopressor  and amlodipine     OSA on CPAP Continue CPAP nightly at bedtime    GERD (gastroesophageal reflux disease) Continue PPI    Hypertriglyceridemia without hypercholesterolemia Trigs at 315, on fenofibrate  and rosuvastatin     CAD (coronary artery disease)  EKG sinus tachycardia Troponin at 8 Continue rosuvastatin  and Ranexa . On Aspirin  at home    DVT prophylaxis: Heparin  Code Status:Full Family Communication: None at bedside    Consultants:  GI has been consulted  Procedures:  Awaiting MRCP    Subjective: Pt is found AxOx4  in moderate distress, c/o epigastric pain (rated at a 7/10, non-radiating). She states that she has felt nauseous, but has not vomited since admission. States she is having some dizziness diarrhea and overall feels unwell. Denies having used ozempic for anywhere form 6-12 months. Denies alcohol  use, and states this feels just like prior pancreatitis bouts, her last one being about 1 year ago.   Objective: Vitals:   08/23/24 2350 08/24/24 0445 08/24/24 0732 08/24/24 0817  BP:   123/76 133/84  Pulse:    (!) 115  Resp: 20 19 18    Temp: 98 F (36.7 C) 98.3 F (36.8 C) 97.8 F (36.6 C)   TempSrc: Oral Oral Oral   SpO2:      Weight:      Height:       No intake or output data in the 24 hours ending 08/24/24 0834 Filed Weights   08/23/24 1940 08/23/24 2200  Weight: 109 kg 109 kg    Examination:  General exam: Appears calm but uncomfortable, moderate distress.   Respiratory system: Clear to auscultation. Respiratory effort increased. Cardiovascular system: S1 & S2 heard, RRR. No JVD, murmurs, rubs, gallops or clicks. No pedal edema. Gastrointestinal system: Abdomen is nondistended, soft and tender in epigastric region, as well as LLQ. No organomegaly or masses felt. Normal bowel sounds heard. Central nervous system: Alert and oriented. No focal neurological deficits. Extremities: Symmetric 5 x 5 power. Skin: No rashes, lesions or ulcers    Data Reviewed: I have personally reviewed following labs and imaging studies  CBC: Recent Labs  Lab 08/24/24 0010 08/24/24 0536  WBC 10.4 9.2  NEUTROABS 8.4*  --  HGB 13.5 12.8  HCT 41.5 39.5  MCV 89.2 89.8  PLT 151 160    Basic Metabolic Panel: Recent Labs  Lab 08/24/24 0010 08/24/24 0537 08/24/24 0619  NA 137 136  --   K 4.3 5.2*  --   CL 99 99  --   CO2 17* 14*  --   GLUCOSE 327* 299*  --   BUN 20 22*  --   CREATININE 0.98 0.90  --   CALCIUM  7.5* 7.6*  --   MG  --   --  2.1    GFR: Estimated Creatinine Clearance: 86.6  mL/min (by C-G formula based on SCr of 0.9 mg/dL).  Liver Function Tests: Recent Labs  Lab 08/24/24 0010 08/24/24 0537  AST 39 42*  ALT 22 21  ALKPHOS 69 84  BILITOT 0.8 0.7  PROT 7.1 6.7  ALBUMIN  3.6 3.2*    CBG: Recent Labs  Lab 08/23/24 2349 08/24/24 0446 08/24/24 0733  GLUCAP 326* 284* 269*     No results found for this or any previous visit (from the past 240 hours).       Radiology Studies: No results found.      Scheduled Meds:  amLODipine   5 mg Oral Daily   ARIPiprazole   20 mg Oral Daily   clonazePAM   2 mg Oral QHS   DULoxetine   120 mg Oral QHS   fenofibrate   160 mg Oral Daily   gabapentin   600 mg Oral BID   heparin   5,000 Units Subcutaneous Q8H   metoprolol  tartrate  25 mg Oral BID   mupirocin  ointment  1 Application Nasal BID   omega-3 acid ethyl esters  2 g Oral BID   pantoprazole   40 mg Oral Daily   ranolazine   500 mg Oral BID   rosuvastatin   10 mg Oral QHS   traZODone   400 mg Oral QHS   Continuous Infusions:  dextrose  5% lactated ringers      insulin      lactated ringers        LOS: 1 day    Time spent: 30 minutes    [employer], PA-S [facility]   To contact the attending provider between 7A-7P or the covering provider during after hours 7P-7A, please log into the web site [email or web address] and access using universal [place] password for that web site. If you do not have the password, please call the hospital operator.  08/24/2024, 8:34 AM   "

## 2024-08-24 NOTE — Significant Event (Signed)
 Rapid Response Event Note   Reason for Call :  Acute oxygen  desaturation when laid flat for MRI. SpO2, spot check, 70% on 2LNC.   Initial Focused Assessment:  Patient in bed, HOB 30 degrees. Prolonged expiration. Clear breath sounds. Opens eyes to stimuli. Oriented to person and month. PERRLA, 3mm, sluggish. Skin is warm, moist, pale. Denies pain, lightheadedness, headache, or dizziness. States she has been feeling bad, symptoms are not worse. Received Dilaudid  0.5mg  IV at 0831. Was not on monitor at time of oxygen  desaturation. Bicarb trend 17>14.   VS: T 98.57F, BP 117/81, HR 110, RR 22, SpO2 93% on 6LNC CBG: 276  Interventions:  -MRI aborted, not completed due oxygen  desaturation while lying flat -ABG 7.28/ 43/ 80/ 20.7  Plan of Care:  -CPAP/BiPAP PRN -PRN Narcan   Event Summary:  MD Notified: Dr. Raenelle Call Time: [id number] Arrival Time: 9071 End Time: 1030  Leonor LITTIE Danker, RN

## 2024-08-24 NOTE — H&P (Signed)
 " History and Physical    Jamie [name]:[id number] DOB: [birth year] DOA: 08/23/2024  Patient coming from: Home.  Chief Complaint: Abdominal pain.  HPI: PIERCE BIAGINI is a 54 y.o. female with history of diabetes mellitus type 2, depression, hypertension, recurrent pancreatitis probably secondary to hypertriglyceridemia and also in setting of Ozempic had presented to [facility] with complaints of severe abdominal pain ongoing for 2 weeks with some nausea and vomiting.  Pain is mostly in the epigastric area with no associated diarrhea.  Patient denies drinking alcohol  and has had prior history of cholecystectomy.  ED Course: In the ER patient's lipase was 1800 AST was 41 ALT 39 WBC 5.5 hemoglobin 15.  Glucose elevated with elevated anion gap.  CT scan shows acute pancreatitis with early signs of necrosis.  Since changes of necrosis patient was transferred to [facility] for further management.  In the ER patient was kept n.p.o. and had been in the ER for most 3 days now since August 21, 2024.  Has received fluid boluses.  Venous blood gas done showed pH of 7.39 pCO2 34.8.  At the time of my exam patient remains tachycardic.  Labs show bicarb of 17 blood glucose of 327 lipase of 262.  WBC 10.4.  Lactic acid 1.5.  Triglycerides is 315.  Patient admitted for acute pancreatitis.  Review of Systems: As per HPI, rest all negative.   Past Medical History:  Diagnosis Date   Anxiety    Arthritis    knees   Bipolar 1 disorder (HCC)    Chronic diarrhea    Diverticulosis of colon    GERD (gastroesophageal reflux disease)    Hiatal hernia    History of concussion    per pt 11/ 2020 w/ no residual   Hyperlipidemia    Hypertension    followed by pcp   (11-29-2019  per pt stated never had stress test)   MDD (major depressive disorder)    Migraine    neurologist--- dr onita   OSA on CPAP    followed by dr dohmeier   PCOS (polycystic ovarian syndrome)    PONV (postoperative nausea and  vomiting)    Type 2 diabetes mellitus (HCC)    followed by pcp   (11-29-2019  pt stated checks blood sugar daily in am,  fasting sugar--- 96    Past Surgical History:  Procedure Laterality Date   ANAL FISSURE REPAIR  x3  last one  2007  approx.   BIOPSY  09/06/2019   Procedure: BIOPSY;  Surgeon: Shaaron Lamar HERO, MD;  Location: [facility];  Service: Endoscopy;;   COLONOSCOPY WITH PROPOFOL  [name] 09/06/2019   pancolonic diverticulosis. Segmental biopsy completed with benign biopsies.    ESOPHAGOGASTRODUODENOSCOPY (EGD) WITH PROPOFOL  [name] 06/16/2020   Dr. Shaaron: normal esophagus s/p dilation, normal stomach, duodenum.    ESOPHAGOGASTRODUODENOSCOPY (EGD) WITH PROPOFOL  [name] 02/10/2022   Procedure: ESOPHAGOGASTRODUODENOSCOPY (EGD) WITH PROPOFOL ;  Surgeon: Shaaron Lamar HERO, MD;  Location: [facility];  Service: Endoscopy;  Laterality: [name];  9:45am   EVALUATION UNDER ANESTHESIA WITH HEMORRHOIDECTOMY [name] 12/05/2019   Procedure: ANORECTAL EXAM UNDER ANESTHESIA WITH HEMORRHOIDECTOMY, HEMORRHOIDAL LIGATION/PEXY;  Surgeon: Sheldon Standing, MD;  Location: [facility];  Service: General;  Laterality: [name];  GENERAL AND LOCAL   EVALUATION UNDER ANESTHESIA WITH TEAR DUCT PROBING  infant   KNEE ARTHROSCOPY Right 1994   LAPAROSCOPIC CHOLECYSTECTOMY  08-06-2002  @[facility]    LEFT HEART CATH AND CORONARY ANGIOGRAPHY [name] 10/03/2023   Procedure: LEFT  HEART CATH AND CORONARY ANGIOGRAPHY;  Surgeon: Ladona Heinz, MD;  Location: [facility] INVASIVE CV [facility];  Service: Cardiovascular;  Laterality: [name];   LUMBAR LAMINECTOMY/DECOMPRESSION MICRODISCECTOMY Right 01/22/2014   Procedure: LUMBAR FOUR TO FIVE LUMBAR LAMINECTOMY/DECOMPRESSION MICRODISCECTOMY 1 LEVEL;  Surgeon: Catalina CHRISTELLA Stains, MD;  Location: [facility];  Service: Neurosurgery;  Laterality: Right;  Right L45 diskectomy   MALONEY DILATION [name] 06/16/2020   Procedure: AGAPITO DILATION;  Surgeon: Shaaron Lamar CHRISTELLA, MD;  Location: [facility];  Service: Endoscopy;  Laterality:  [name];   MALONEY DILATION [name] 02/10/2022   Procedure: AGAPITO DILATION;  Surgeon: Shaaron Lamar CHRISTELLA, MD;  Location: [facility];  Service: Endoscopy;  Laterality: [name];     reports that she has never smoked. She has never used smokeless tobacco. She reports that she does not drink alcohol  and does not use drugs.  Allergies[1]  Family History  Problem Relation Age of Onset   COPD Father    Anxiety disorder Maternal Grandmother    Depression Maternal Grandmother    Colon cancer Neg Hx    Colon polyps Neg Hx     Prior to Admission medications  Medication Sig Start Date End Date Taking? Authorizing Provider  ACCU-CHEK GUIDE test strip  05/18/23   [provider]  albuterol  (VENTOLIN  HFA) 108 (90 Base) MCG/ACT inhaler Inhale 2 puffs into the lungs every 4 (four) hours as needed for wheezing or shortness of breath. 06/04/23   Cleotilde Rogue, MD  amLODipine  (NORVASC ) 5 MG tablet Take 1 tablet (5 mg total) by mouth daily. 10/03/23 02/09/25  Ladona Heinz, MD  ARIPiprazole  (ABILIFY ) 20 MG tablet Take 1 tablet (20 mg total) by mouth daily. 08/15/24   Plovsky, Elna, MD  clonazePAM  (KLONOPIN ) 1 MG tablet Take 1 mg in the morning and 2 mg at night 08/15/24   Plovsky, Elna, MD  Continuous Glucose Sensor (DEXCOM G7 SENSOR) MISC USE AS DIRECTED AND CHANGE EVERY 10 DAYS 07/30/24   [name] Benton PARAS, NP  dexlansoprazole  (DEXILANT ) 60 MG capsule Take 1 capsule (60 mg total) by mouth daily. 06/05/24   Kennedy Charmaine CROME, NP  Dextromethorphan-buPROPion  ER (AUVELITY ) 45-105 MG TBCR 1 qam for 1 week then 1 bid 08/15/24   Plovsky, Elna, MD  dicyclomine  (BENTYL ) 10 MG capsule Take 1 capsule (10 mg total) by mouth 2 (two) times daily as needed for spasms. 10/27/23   Kennedy Charmaine CROME, NP  DULoxetine  (CYMBALTA ) 60 MG capsule Take 2 capsules (120 mg total) by mouth at bedtime. 08/15/24   Plovsky, Elna, MD  fenofibrate  micronized (LOFIBRA) 134 MG capsule TAKE ONE CAPSULE BY MOUTH DAILY 01/05/24   Mallipeddi, Vishnu P,  MD  fluticasone  (FLONASE) 50 MCG/ACT nasal spray Place 1 spray into both nostrils at bedtime as needed for allergies or rhinitis.     [provider]  furosemide  (LASIX ) 20 MG tablet TAKE 1 TABLET BY MOUTH DAILY 05/24/24   Mallipeddi, Vishnu P, MD  gabapentin  (NEURONTIN ) 600 MG tablet Take 600 mg by mouth 2 (two) times daily. 05/18/23   [provider]  Insulin  Pen Needle (CARETOUCH PEN NEEDLES) 31G X 6 MM MISC Use to inject insulin  3 times daily 07/30/24   [name] Benton PARAS, NP  insulin  regular human CONCENTRATED (HUMULIN  R U-500 KWIKPEN) 500 UNIT/ML KwikPen Inject 90 Units into the skin 3 (three) times daily with meals. 07/30/24   [name] Benton PARAS, NP  metFORMIN  (GLUCOPHAGE -XR) 500 MG 24 hr tablet Take 1 tablet (500 mg total) by mouth daily with breakfast.  07/30/24   [name] Benton PARAS, NP  metoprolol  tartrate (LOPRESSOR ) 25 MG tablet TAKE 1 TABLET BY MOUTH TWICE DAILY 06/27/24   Mallipeddi, Vishnu P, MD  Multiple Vitamins-Minerals (MULTIVITAMIN WITH MINERALS) tablet Take 1 tablet by mouth daily.    [provider]  nitroGLYCERIN  (NITROSTAT ) 0.4 MG SL tablet Place 1 tablet (0.4 mg total) under the tongue every 5 (five) minutes x 3 doses as needed (if no relief after 3rd dose proceed to ED or call 911). 08/09/23   Mallipeddi, Vishnu P, MD  NON FORMULARY Pt uses a cpap nightly    [provider]  omega-3 acid ethyl esters (LOVAZA ) 1 g capsule Take 2 g by mouth 2 (two) times daily. 10/22/22   [provider]  omeprazole  (PRILOSEC) 40 MG capsule TAKE ONE CAPSULE BY MOUTH DAILY 02/02/24   Kennedy Pfeiffer L, NP  ondansetron  (ZOFRAN -ODT) 4 MG disintegrating tablet TAKE 1 TABLET BY MOUTH EVERY 8 HOURS AS NEEDED FOR NAUSEA AND VOMITING 06/05/24   Kennedy Pfeiffer L, NP  Pancrelipase , Lip-Prot-Amyl, (ZENPEP ) 40000-126000 units CPEP TAKE TWO CAPSULES BY MOUTH THREE TIMES DAILY WITH MEALS AND TAKE 1 CAPSULE with AT least 2 snacks 06/05/24   Kennedy Pfeiffer CROME, NP  ranolazine   (RANEXA ) 500 MG 12 hr tablet Take 1 tablet (500 mg total) by mouth 2 (two) times daily. 06/06/24   Miriam Norris, NP  rosuvastatin  (CRESTOR ) 10 MG tablet TAKE 1 TABLET BY MOUTH AT BEDTIME 07/05/24   Miriam Norris, NP  traZODone  (DESYREL ) 100 MG tablet Take 4 tablets (400 mg total) by mouth at bedtime. 08/15/24   Tasia Lung, MD  valsartan  (DIOVAN ) 80 MG tablet TAKE 1 TABLET BY MOUTH DAILY 05/24/24   Wert, Michael B, MD    Physical Exam: Constitutional: Moderately built and nourished. Vitals:   08/23/24 1940 08/23/24 2200 08/23/24 2350  BP: (!) 140/72    Pulse: (!) 126    Resp: (!) 22  20  Temp: 98 F (36.7 C)  98 F (36.7 C)  TempSrc: Oral  Oral  SpO2: 95%    Weight: 109 kg 109 kg   Height: 5' 3 (1.6 m) 5' 3 (1.6 m)    Eyes: Anicteric no pallor. ENMT: No discharge from the ears eyes nose or mouth. Neck: No mass felt.  No neck rigidity. Respiratory: No rhonchi or crepitations. Cardiovascular: S1-S2 heard. Abdomen: Distended mild epigastric tenderness no guarding or rigidity. Musculoskeletal: No edema. Skin: No rash. Neurologic: Alert awake oriented to time place and person.  Moves all extremities. Psychiatric: Appears normal.  Normal affect.   Labs on Admission: I have personally reviewed following labs and imaging studies  CBC: Recent Labs  Lab 08/24/24 0010  WBC 10.4  NEUTROABS 8.4*  HGB 13.5  HCT 41.5  MCV 89.2  PLT 151   Basic Metabolic Panel: Recent Labs  Lab 08/24/24 0010  NA 137  K 4.3  CL 99  CO2 17*  GLUCOSE 327*  BUN 20  CREATININE 0.98  CALCIUM  7.5*   GFR: Estimated Creatinine Clearance: 79.5 mL/min (by C-G formula based on SCr of 0.98 mg/dL). Liver Function Tests: Recent Labs  Lab 08/24/24 0010  AST 39  ALT 22  ALKPHOS 69  BILITOT 0.8  PROT 7.1  ALBUMIN  3.6   Recent Labs  Lab 08/24/24 0010  LIPASE 262*   No results for input(s): AMMONIA in the last 168 hours. Coagulation Profile: No results for input(s): INR,  PROTIME in the last 168 hours. Cardiac Enzymes: No results for input(s):  CKTOTAL, CKMB, CKMBINDEX, TROPONINI in the last 168 hours. BNP (last 3 results) No results for input(s): PROBNP in the last 8760 hours. HbA1C: No results for input(s): HGBA1C in the last 72 hours. CBG: Recent Labs  Lab 08/23/24 2349  GLUCAP 326*   Lipid Profile: Recent Labs    08/24/24 0010  TRIG 315*   Thyroid  Function Tests: Recent Labs    08/24/24 0010  TSH 1.170   Anemia Panel: No results for input(s): VITAMINB12, FOLATE, FERRITIN, TIBC, IRON, RETICCTPCT in the last 72 hours. Urine analysis:    Component Value Date/Time   COLORURINE YELLOW 05/25/2023 1521   APPEARANCEUR CLEAR 05/25/2023 1521   APPEARANCEUR Cloudy 09/16/2011 1746   LABSPEC 1.023 05/25/2023 1521   LABSPEC 1.025 09/16/2011 1746   PHURINE 5.5 05/25/2023 1521   GLUCOSEU 3+ (A) 05/25/2023 1521   GLUCOSEU Negative 09/16/2011 1746   HGBUR NEGATIVE 05/25/2023 1521   BILIRUBINUR SMALL (A) 01/26/2023 1800   BILIRUBINUR Negative 09/16/2011 1746   KETONESUR TRACE (A) 05/25/2023 1521   PROTEINUR NEGATIVE 05/25/2023 1521   UROBILINOGEN 0.2 11/20/2007 1843   NITRITE NEGATIVE 05/25/2023 1521   LEUKOCYTESUR 1+ (A) 05/25/2023 1521   LEUKOCYTESUR 2+ 09/16/2011 1746   Sepsis Labs: @LABRCNTIP (procalcitonin:4,lacticidven:4) )No results found for this or any previous visit (from the past 240 hours).   Radiological Exams on Admission: No results found.    Assessment/Plan Principal Problem:   Acute pancreatitis    Acute pancreatitis with signs of early necrosis -    patient denies drinking alcohol  and has had prior history of hypertriglyceridemia which could be the cause for pancreatitis and also was attributed to Ozempic.  Patient had been waiting in the ER at [facility] for 3 days NPO.  Patient wants to try a liquid diet which I have ordered.  Will check MRCP.  Will consult GI.  Continue with IV fluids and pain  relief medications.  Has had prior cholecystectomy. Diabetes mellitus type 2 with anion gap acidosis concerning for early DKA.  I have ordered fluid boluses and if repeat metabolic panel still shows elevated anion gap and also if beta-hydroxybutyrate acid is elevated may need to start patient on DKA protocol with IV insulin .  For now since patient is on liquid diet I have discussed with pharmacy and we will keep patient on NovoLog  20 units 3 times daily with sliding scale coverage.  Patient takes insulin  500, ninety 3 times daily.  Check hemoglobin A1c. Hypertension on beta-blockers amlodipine .  Patient also takes ARB and lisinopril  which I advised patient not to take in combination.  For now we will hold ARB and lisinopril .  Follow blood pressure trends. History of hypertriglyceridemia on fenofibrate  and statins.  Triglycerides 315. History of nonobstructive CAD per cardiac cath on March 2025 managed medically with statins aspirin  beta-blockers Ranexa .  Troponins are pending.  EKG also is pending. Depression and anxiety on Abilify  Cymbalta  trazodone . Peripheral neuropathy on gabapentin . GERD on PPI. Sleep apnea on CPAP at bedtime.  Since patient has acute pancreatitis with concern for early necrosis will need further workup and more than 2 midnight stay.  DVT prophylaxis: Heparin . Code Status: Full code. Family Communication: [place] with patient. Disposition Plan: Progressive care. Consults called: GI. Admission status: Inpatient.         [1]  Allergies Allergen Reactions   Codeine Hives and Swelling   Ozempic (0.25 Or 0.5 Mg-Dose) [Semaglutide(0.25 Or 0.5mg -Dos)]     pancreatitis   Morphine  And Codeine Swelling and Rash   "

## 2024-08-24 NOTE — Inpatient Diabetes Management (Signed)
 Inpatient Diabetes Program Recommendations  AACE/ADA: New Consensus Statement on Inpatient Glycemic Control (2015)  Target Ranges:  Prepandial:   less than 140 mg/dL      Peak postprandial:   less than 180 mg/dL (1-2 hours)      Critically ill patients:  140 - 180 mg/dL   Lab Results  Component Value Date   GLUCAP 276 (H) 08/24/2024   HGBA1C 12.3 (A) 07/30/2024    Review of Glycemic Control  Diabetes history: DM type 2 Outpatient Diabetes medications: Concentrated Humulin  R U-500 90 units tid Metformin  500 mg Daily Current orders for Inpatient glycemic control:  IV insulin /Endotool/DKA A1c 12.3% on 1/5 Level obtained at Endocrinology office visit with Benton Rio, NP, educated at that time and insulin  increased, also noted recently educated by Santana Duke, RD.   Watch on IV insulin  till acidosis clears, may need to restart a portion of Humulin  R U-500 insulin  tid when transitioning off IV insulin . May require large doses.  Thanks,  Clotilda Bull RN, MSN, BC-ADM Inpatient Diabetes Coordinator Team Pager [phone] (8a-5p)

## 2024-08-24 NOTE — Consult Note (Addendum)
 "   Consultation  Referring Provider: TRH/ Ghimire Primary Care Physician:  Jolee Greig DEL, PA-C Primary Gastroenterologist:  Dr.Rourk  Reason for Consultation: Acute pancreatitis  HPI: Jamie [name] is a 54 y.o. female with history of morbid obesity, bipolar disorder, sleep apnea, major depressive disorder, diabetes mellitus, and history of recurrent pancreatitis which by chart appears to be felt secondary to hypertriglyceridemia. Patient had presented to the ER at [facility] apparently 3 days ago with 2-week history of persistent abdominal pain and intermittent nausea and vomiting.  She had to remain in the ER for about 48 hours prior to finding transfer and was transferred here last p.m. On presentation there lipase was 1832/hemoglobin 15/hematocrit 5.5 BUN 19/creatinine 0.87/glucose 394 with anion gap. LFTs within normal limits.  CT of the abdomen pelvis was done with contrast that shows marked Abran pancreatic edema and induration suggesting acute pancreatitis there is subtle foci of hypoenhancement of the body and the tail of the pancreas suggesting pancreatic necrosis close follow-up warranted, moderate ill-defined edema into the porta hepatis and inferiorly into the right paracolic gutter, diffusely fatty infiltrated enlarged liver Has not been on IV antibiotics. Has been afebrile  On transfer last p.m. WBC 10.4/hemoglobin 13.5/hematocrit 41.9/potassium 4.3/BUN 20/creatinine 0.98 LFTs within normal limits Lactate 1.5 Lipase 262.  Patient had been sent for MRI/MRCP within the past hour, was unable to lie flat and apparently had desaturation into the 70s so MRCP was aborted.  She had received Dilaudid  prior to being sent for MRI.  Patient is somnolent currently she is arousable but will only answer in 1 or 2 words. She is on 3.5 L and satting in the low 90s currently Blood pressure 117/80/pulse 107 Denies any abdominal pain  Had been started on insulin  drip last  p.m. Receiving LR 150/h  Per GI notes from [facility] in November 2025 patient has had multiple episodes of interstitial pancreatitis previously felt secondary to hypertriglyceridemia.  She had had a mild episode of pancreatitis in September 2025 but did not require hospitalization for that. She is on fenofibrate , Crestor , Zen Pep at home. This morning she denies being on Ozempic which was noted in her chart on admission.  She reports she has not been on that for months.     Past Medical History:  Diagnosis Date   Anxiety    Arthritis    knees   Bipolar 1 disorder (HCC)    Chronic diarrhea    Diverticulosis of colon    GERD (gastroesophageal reflux disease)    Hiatal hernia    History of concussion    per pt 11/ 2020 w/ no residual   Hyperlipidemia    Hypertension    followed by pcp   (11-29-2019  per pt stated never had stress test)   MDD (major depressive disorder)    Migraine    neurologist--- dr onita   OSA on CPAP    followed by dr dohmeier   PCOS (polycystic ovarian syndrome)    PONV (postoperative nausea and vomiting)    Type 2 diabetes mellitus (HCC)    followed by pcp   (11-29-2019  pt stated checks blood sugar daily in am,  fasting sugar--- 96    Past Surgical History:  Procedure Laterality Date   ANAL FISSURE REPAIR  x3  last one  2007  approx.   BIOPSY  09/06/2019   Procedure: BIOPSY;  Surgeon: Shaaron Lamar HERO, MD;  Location: [facility];  Service: Endoscopy;;   COLONOSCOPY WITH  PROPOFOL  [name] 09/06/2019   pancolonic diverticulosis. Segmental biopsy completed with benign biopsies.    ESOPHAGOGASTRODUODENOSCOPY (EGD) WITH PROPOFOL  [name] 06/16/2020   Dr. Shaaron: normal esophagus s/p dilation, normal stomach, duodenum.    ESOPHAGOGASTRODUODENOSCOPY (EGD) WITH PROPOFOL  [name] 02/10/2022   Procedure: ESOPHAGOGASTRODUODENOSCOPY (EGD) WITH PROPOFOL ;  Surgeon: Shaaron Lamar HERO, MD;  Location: [facility];  Service: Endoscopy;  Laterality: [name];  9:45am   EVALUATION UNDER  ANESTHESIA WITH HEMORRHOIDECTOMY [name] 12/05/2019   Procedure: ANORECTAL EXAM UNDER ANESTHESIA WITH HEMORRHOIDECTOMY, HEMORRHOIDAL LIGATION/PEXY;  Surgeon: Sheldon Standing, MD;  Location: [facility];  Service: General;  Laterality: [name];  GENERAL AND LOCAL   EVALUATION UNDER ANESTHESIA WITH TEAR DUCT PROBING  infant   KNEE ARTHROSCOPY Right 1994   LAPAROSCOPIC CHOLECYSTECTOMY  08-06-2002  @[facility]    LEFT HEART CATH AND CORONARY ANGIOGRAPHY [name] 10/03/2023   Procedure: LEFT HEART CATH AND CORONARY ANGIOGRAPHY;  Surgeon: Ladona Heinz, MD;  Location: [facility];  Service: Cardiovascular;  Laterality: [name];   LUMBAR LAMINECTOMY/DECOMPRESSION MICRODISCECTOMY Right 01/22/2014   Procedure: LUMBAR FOUR TO FIVE LUMBAR LAMINECTOMY/DECOMPRESSION MICRODISCECTOMY 1 LEVEL;  Surgeon: Catalina HERO Stains, MD;  Location: [facility];  Service: Neurosurgery;  Laterality: Right;  Right L45 diskectomy   MALONEY DILATION [name] 06/16/2020   Procedure: AGAPITO DILATION;  Surgeon: Shaaron Lamar HERO, MD;  Location: [facility];  Service: Endoscopy;  Laterality: [name];   MALONEY DILATION [name] 02/10/2022   Procedure: AGAPITO DILATION;  Surgeon: Shaaron Lamar HERO, MD;  Location: [facility];  Service: Endoscopy;  Laterality: [name];    Prior to Admission medications  Medication Sig Start Date End Date Taking? Authorizing Provider  ACCU-CHEK GUIDE test strip  05/18/23   [provider]  albuterol  (VENTOLIN  HFA) 108 (90 Base) MCG/ACT inhaler Inhale 2 puffs into the lungs every 4 (four) hours as needed for wheezing or shortness of breath. 06/04/23   Cleotilde Rogue, MD  amLODipine  (NORVASC ) 5 MG tablet Take 1 tablet (5 mg total) by mouth daily. 10/03/23 02/09/25  Ladona Heinz, MD  ARIPiprazole  (ABILIFY ) 20 MG tablet Take 1 tablet (20 mg total) by mouth daily. 08/15/24   Plovsky, Elna, MD  clonazePAM  (KLONOPIN ) 1 MG tablet Take 1 mg in the morning and 2 mg at night 08/15/24   Plovsky, Elna, MD  Continuous Glucose Sensor  (DEXCOM G7 SENSOR) MISC USE AS DIRECTED AND CHANGE EVERY 10 DAYS 07/30/24   [name] Benton PARAS, NP  dexlansoprazole  (DEXILANT ) 60 MG capsule Take 1 capsule (60 mg total) by mouth daily. 06/05/24   Kennedy Charmaine CROME, NP  Dextromethorphan-buPROPion  ER (AUVELITY ) 45-105 MG TBCR 1 qam for 1 week then 1 bid 08/15/24   Plovsky, Elna, MD  dicyclomine  (BENTYL ) 10 MG capsule Take 1 capsule (10 mg total) by mouth 2 (two) times daily as needed for spasms. 10/27/23   Kennedy Charmaine CROME, NP  DULoxetine  (CYMBALTA ) 60 MG capsule Take 2 capsules (120 mg total) by mouth at bedtime. 08/15/24   Plovsky, Elna, MD  fenofibrate  micronized (LOFIBRA) 134 MG capsule TAKE ONE CAPSULE BY MOUTH DAILY 01/05/24   Mallipeddi, Vishnu P, MD  fluticasone  (FLONASE) 50 MCG/ACT nasal spray Place 1 spray into both nostrils at bedtime as needed for allergies or rhinitis.     [provider]  furosemide  (LASIX ) 20 MG tablet TAKE 1 TABLET BY MOUTH DAILY 05/24/24   Mallipeddi, Vishnu P, MD  gabapentin  (NEURONTIN ) 600 MG tablet Take 600 mg by mouth 2 (two) times daily. 05/18/23   [provider]  Insulin  Pen Needle (CARETOUCH PEN NEEDLES) 31G X 6 MM MISC Use to inject insulin  3 times daily 07/30/24   [name] Benton PARAS, NP  insulin  regular human CONCENTRATED (HUMULIN  R U-500 KWIKPEN) 500 UNIT/ML KwikPen Inject 90 Units into the skin 3 (three) times daily with meals. 07/30/24   [name] Benton PARAS, NP  metFORMIN  (GLUCOPHAGE -XR) 500 MG 24 hr tablet Take 1 tablet (500 mg total) by mouth daily with breakfast. 07/30/24   [name] Benton PARAS, NP  metoprolol  tartrate (LOPRESSOR ) 25 MG tablet TAKE 1 TABLET BY MOUTH TWICE DAILY 06/27/24   Mallipeddi, Vishnu P, MD  Multiple Vitamins-Minerals (MULTIVITAMIN WITH MINERALS) tablet Take 1 tablet by mouth daily.    [provider]  nitroGLYCERIN  (NITROSTAT ) 0.4 MG SL tablet Place 1 tablet (0.4 mg total) under the tongue every 5 (five) minutes x 3 doses as needed (if no relief after 3rd dose  proceed to ED or call 911). 08/09/23   Mallipeddi, Vishnu P, MD  NON FORMULARY Pt uses a cpap nightly    [provider]  omega-3 acid ethyl esters (LOVAZA ) 1 g capsule Take 2 g by mouth 2 (two) times daily. 10/22/22   [provider]  omeprazole  (PRILOSEC) 40 MG capsule TAKE ONE CAPSULE BY MOUTH DAILY 02/02/24   Kennedy Pfeiffer L, NP  ondansetron  (ZOFRAN -ODT) 4 MG disintegrating tablet TAKE 1 TABLET BY MOUTH EVERY 8 HOURS AS NEEDED FOR NAUSEA AND VOMITING 06/05/24   Kennedy Pfeiffer L, NP  Pancrelipase , Lip-Prot-Amyl, (ZENPEP ) 40000-126000 units CPEP TAKE TWO CAPSULES BY MOUTH THREE TIMES DAILY WITH MEALS AND TAKE 1 CAPSULE with AT least 2 snacks 06/05/24   Kennedy Pfeiffer CROME, NP  ranolazine  (RANEXA ) 500 MG 12 hr tablet Take 1 tablet (500 mg total) by mouth 2 (two) times daily. 06/06/24   Miriam Norris, NP  rosuvastatin  (CRESTOR ) 10 MG tablet TAKE 1 TABLET BY MOUTH AT BEDTIME 07/05/24   Miriam Norris, NP  traZODone  (DESYREL ) 100 MG tablet Take 4 tablets (400 mg total) by mouth at bedtime. 08/15/24   Tasia Lung, MD  valsartan  (DIOVAN ) 80 MG tablet TAKE 1 TABLET BY MOUTH DAILY 05/24/24   Wert, Michael B, MD    Current Facility-Administered Medications  Medication Dose Route Frequency Provider Last Rate Last Admin   amLODipine  (NORVASC ) tablet 5 mg  5 mg Oral Daily Franky Redia SAILOR, MD       ARIPiprazole  (ABILIFY ) tablet 20 mg  20 mg Oral Daily Kakrakandy, Arshad N, MD   20 mg at 08/24/24 9677   clonazePAM  (KLONOPIN ) tablet 2 mg  2 mg Oral QHS Kakrakandy, Arshad N, MD   2 mg at 08/24/24 0321   dextrose  5 % in lactated ringers  infusion   Intravenous Continuous Ghimire, Donalda HERO, MD       dextrose  50 % solution 0-50 mL  0-50 mL Intravenous PRN Raenelle Donalda HERO, MD       dicyclomine  (BENTYL ) capsule 10 mg  10 mg Oral BID PRN Franky Redia SAILOR, MD       DULoxetine  (CYMBALTA ) DR capsule 120 mg  120 mg Oral QHS Franky Redia SAILOR, MD       fenofibrate  tablet 160 mg  160  mg Oral Daily Franky Redia SAILOR, MD   160 mg at 08/24/24 0816   gabapentin  (NEURONTIN ) capsule 600 mg  600 mg Oral BID Kakrakandy, Arshad N, MD   600 mg at 08/24/24 0816   heparin  injection 5,000 Units  5,000 Units Subcutaneous Q8H Franky Redia SAILOR, MD  5,000 Units at 08/24/24 0514   HYDROmorphone  (DILAUDID ) injection 0.5 mg  0.5 mg Intravenous Q3H PRN Franky Redia SAILOR, MD   0.5 mg at 08/24/24 0831   insulin  regular, human (MYXREDLIN ) 100 units/ 100 mL infusion   Intravenous Continuous Ghimire, Donalda HERO, MD       lactated ringers  infusion   Intravenous Continuous Raenelle Donalda HERO, MD       loperamide  (IMODIUM ) capsule 2 mg  2 mg Oral PRN Raenelle Donalda HERO, MD   2 mg at 08/24/24 9168   metoprolol  tartrate (LOPRESSOR ) tablet 25 mg  25 mg Oral BID Franky Redia SAILOR, MD   25 mg at 08/24/24 9182   mupirocin  ointment (BACTROBAN ) 2 % 1 Application  1 Application Nasal BID Franky Redia SAILOR, MD       omega-3 acid ethyl esters (LOVAZA ) capsule 2 g  2 g Oral BID Franky Redia SAILOR, MD       pantoprazole  (PROTONIX ) EC tablet 40 mg  40 mg Oral Daily Franky Redia SAILOR, MD   40 mg at 08/24/24 9182   ranolazine  (RANEXA ) 12 hr tablet 500 mg  500 mg Oral BID Franky Redia SAILOR, MD   500 mg at 08/24/24 0818   rosuvastatin  (CRESTOR ) tablet 10 mg  10 mg Oral QHS Franky Redia SAILOR, MD       traZODone  (DESYREL ) tablet 400 mg  400 mg Oral QHS Franky Redia SAILOR, MD   400 mg at 08/24/24 0321    Allergies as of 08/22/2024 - Review Complete 07/30/2024  Allergen Reaction Noted   Codeine Hives and Swelling 06/07/2011   Ozempic (0.25 or 0.5 mg-dose) [semaglutide(0.25 or 0.5mg -dos)]  03/02/2023   Morphine  and codeine Swelling and Rash 01/21/2014    Family History  Problem Relation Age of Onset   COPD Father    Anxiety disorder Maternal Grandmother    Depression Maternal Grandmother    Colon cancer Neg Hx    Colon polyps Neg Hx     Social History   Socioeconomic History    Marital status: Married    Spouse name: Not on file   Number of children: 0   Years of education: college   Highest education level: Not on file  Occupational History   Occupation: disabled  Tobacco Use   Smoking status: Never   Smokeless tobacco: Never  Vaping Use   Vaping status: Never Used  Substance and Sexual Activity   Alcohol  use: No   Drug use: Never   Sexual activity: Yes    Partners: Male    Birth control/protection: None  Other Topics Concern   Not on file  Social History Narrative   Patient lives at home alone    Patient is right handed   Patient drinks soda's daily   Social Drivers of Health   Tobacco Use: Low Risk (08/24/2024)   Patient History    Smoking Tobacco Use: Never    Smokeless Tobacco Use: Never    Passive Exposure: Not on file  Financial Resource Strain: Not on file  Food Insecurity: No Food Insecurity (08/23/2024)   Epic    Worried About Programme Researcher, Broadcasting/film/video in the Last Year: Never true    Ran Out of Food in the Last Year: Never true  Transportation Needs: No Transportation Needs (08/23/2024)   Epic    Lack of Transportation (Medical): No    Lack of Transportation (Non-Medical): No  Physical Activity: Not on file  Stress: Not on file  Social Connections: Not  on file  Intimate Partner Violence: Not At Risk (08/23/2024)   Epic    Fear of Current or Ex-Partner: No    Emotionally Abused: No    Physically Abused: No    Sexually Abused: No  Depression (PHQ2-9): High Risk (01/17/2024)   Depression (PHQ2-9)    PHQ-2 Score: 19  Alcohol  Screen: Not on file  Housing: Low Risk (08/23/2024)   Epic    Unable to Pay for Housing in the Last Year: No    Number of Times Moved in the Last Year: 0    Homeless in the Last Year: No  Utilities: Not At Risk (08/23/2024)   Epic    Threatened with loss of utilities: No  Health Literacy: Low Risk (09/03/2021)   Received from [facility]    How often do you need to have someone help you when  you read instructions, pamphlets, or other written material from your doctor or pharmacy?: Never    Review of Systems: Pertinent positive and negative review of systems were noted in the above HPI section.  All other review of systems was otherwise negative.   Physical Exam: Vital signs in last 24 hours: Temp:  [97.8 F (36.6 C)-98.3 F (36.8 C)] 97.8 F (36.6 C) (01/30 0732) Pulse Rate:  [113-126] 113 (01/30 0927) Resp:  [18-24] 24 (01/30 0927) BP: (108-140)/(65-84) 108/65 (01/30 0927) SpO2:  [95 %] 95 % (01/30 0927) Weight:  [109 kg] 109 kg (01/29 2200) Last BM Date : 08/24/24 General:   Alert,  Well-developed, acute on chronically ill-appearing obese white female, somnolent but arousablecooperative in NAD-on O23.5 Liters/sat 92 Head:  Normocephalic and atraumatic. Eyes:  Sclera clear, no icterus.   Conjunctiva pink. Ears:  Normal auditory acuity. Nose:  No deformity, discharge,  or lesions. Mouth:  No deformity or lesions.   Neck:  Supple; no masses or thyromegaly. Lungs: No increased work of breathing, decreased breath sounds bilateral bases . Heart: Tachycardic regular rate and rhythm; no murmurs, clicks, rubs,  or gallops. Abdomen: Morbidly obese, soft, bowel sounds are present there are some mild tenderness across the upper abdomen no guarding or rebound no palpable mass or hepatosplenomegaly Rectal: Not done Msk:  Symmetrical without gross deformities. . Pulses:  Normal pulses noted. Extremities:  Without clubbing or edema. Neurologic:  Alert and  oriented x4; somnolent currently Skin:  Intact without significant lesions or rashes..  Intake/Output from previous day: No intake/output data recorded. Intake/Output this shift: No intake/output data recorded.  Lab Results: Recent Labs    08/24/24 0010 08/24/24 0536  WBC 10.4 9.2  HGB 13.5 12.8  HCT 41.5 39.5  PLT 151 160   BMET Recent Labs    08/24/24 0010 08/24/24 0537  NA 137 136  K 4.3 5.2*  CL 99 99   CO2 17* 14*  GLUCOSE 327* 299*  BUN 20 22*  CREATININE 0.98 0.90  CALCIUM  7.5* 7.6*   LFT Recent Labs    08/24/24 0010 08/24/24 0537  PROT 7.1 6.7  ALBUMIN  3.6 3.2*  AST 39 42*  ALT 22 21  ALKPHOS 69 84  BILITOT 0.8 0.7  BILIDIR 0.4*  --   IBILI 0.4  --    PT/INR No results for input(s): LABPROT, INR in the last 72 hours. Hepatitis Panel No results for input(s): HEPBSAG, HCVAB, HEPAIGM, HEPBIGM in the last 72 hours.    IMPRESSION:  #69 54 year old morbidly obese white female with insulin -dependent diabetes mellitus, sleep apnea, bipolar disorder with  history of recurrent pancreatitis felt secondary to hypertriglyceridemia in the past who presented to [facility] 3 days ago after 2-week history of progressive abdominal pain nausea and intermittent vomiting. Was in the ER for 48 hours prior to being transferred here.  Initial workup consistent with recurrent acute pancreatitis.  However she has been afebrile, no leukocytosis and initial lactate within normal limits. DKA on presentation with glucose 394 with gap  CT imaging with contrast revealed acute marked Peripancreatic edema and induration consistent with acute pancreatitis there is a small amount of free fluid in the right paracolic gutter and porta hepatis and some small foci of hypoenhancement of the pancreatic body raising concern for possible early pancreatic necrosis.  She has remained afebrile without leukocytosis, lactate yesterday 1.5. Not on IV antibiotics  She certainly has acute recurrent pancreatitis, triglycerides yesterday were 315 but we do not know what her triglyceride level was at the time of onset of pancreatitis, and she had been receiving insulin  in the ER at [facility] prior to triglyceride level being checked.  She is status post cholecystectomy No clear concern by labs for pancreatic necrosis.  #2 DKA-now on insulin  drip #3 desaturation this morning with lying flat for attempt at  MRI, this occurred in setting of Dilaudid .  #4 sleep apnea  PLAN: Liquids as tolerates Has been on LR at 150/h, can decrease fluids today as she has had pancreatitis for multiple days at this time Add sed rate and CRP, continue serial labs She will need repeat imaging but not critical that that is done over the next 24 hours GI will follow along with you    Amy EsterwoodPA-C  08/24/2024, 9:47 AM     Attending Physician's Attestation   I have taken an interval history, reviewed the chart and examined the patient.   This is a patient that the GI service is asked to evaluate in the setting of recurrent acute pancreatitis felt secondary to history of prior hypertriglyceridemia.  She presented to [facility] 3 days ago with a few week history of discomfort and nausea and vomiting actually transferred here.  No triglyceride level was checked on admission to the ED at [facility].  CT at [facility] suggested peripancreatic edema as well as concern for potential developing necrosis.  Transferred into last night with triglycerides found to be in the 300 range with patient on insulin  drip as a result of DKA.  She was scheduled for an MRI/MRCP but this was canceled earlier today as a result of hypoxemia.  Patient on exam this afternoon is nontoxic but appears chronically ill with abdominal pain generalized throughout.  She has no appetite.  She remains on insulin  drip.  Patient is feeling that pain control is not completely adequate but better than when she was at [facility].  She denies any fevers or chills.  She just wants to feel better.  Patient clinical status is showing evidence of acute pancreatitis.  Although we do not know her initial triglyceride level, seeing that it remains elevated while being on insulin  drip would be suggestive that her triglycerides may not be fully/adequately controlled but we do not know for sure.  It would be most reasonable for her to have an endocrinologist work with her  regularly in an effort of trying to optimize her triglyceride levels.  If she comes in with recurrent pancreatitis in the future, triglyceride level should be checked to see if they meet criteria where in which this would remain the likely diagnosis.  Cross-sectional imaging can be considered in the coming days (potentially on Saturday versus Sunday) pending her overall clinical course.  If she worsens we will obtain repeat imaging sooner.  I will draw inflammatory markers tomorrow.  If she is not able to advance her diet over the course of Saturday then Sunday or Monday she will need core track placement.  For now continue fluids.  All patient questions were answered to the best of my ability, and the patient agrees to the aforementioned plan of action with follow-up as indicated.   I agree with the Advanced Practitioner's note, impression, and recommendations with updates and my documentation as noted above.  The majority of the medical decision making/process, formulation of the impression/plan of action for the patient were performed by me with substantive portion of this encounter (>50% time spent including complete performance of at least one of the key components of MDM, History, and/or Exam).   Aloha Finner, MD [facility] Advanced [facility]oscopy Office # [phone]    "

## 2024-08-25 ENCOUNTER — Inpatient Hospital Stay (HOSPITAL_COMMUNITY)

## 2024-08-25 DIAGNOSIS — G9341 Metabolic encephalopathy: Secondary | ICD-10-CM

## 2024-08-25 DIAGNOSIS — Z794 Long term (current) use of insulin: Secondary | ICD-10-CM | POA: Diagnosis not present

## 2024-08-25 DIAGNOSIS — G4730 Sleep apnea, unspecified: Secondary | ICD-10-CM

## 2024-08-25 DIAGNOSIS — E1142 Type 2 diabetes mellitus with diabetic polyneuropathy: Secondary | ICD-10-CM | POA: Diagnosis not present

## 2024-08-25 DIAGNOSIS — F418 Other specified anxiety disorders: Secondary | ICD-10-CM | POA: Diagnosis not present

## 2024-08-25 DIAGNOSIS — R0689 Other abnormalities of breathing: Secondary | ICD-10-CM | POA: Diagnosis not present

## 2024-08-25 DIAGNOSIS — G4733 Obstructive sleep apnea (adult) (pediatric): Secondary | ICD-10-CM | POA: Diagnosis not present

## 2024-08-25 DIAGNOSIS — K8500 Idiopathic acute pancreatitis without necrosis or infection: Secondary | ICD-10-CM | POA: Diagnosis not present

## 2024-08-25 DIAGNOSIS — E781 Pure hyperglyceridemia: Secondary | ICD-10-CM | POA: Diagnosis not present

## 2024-08-25 DIAGNOSIS — K219 Gastro-esophageal reflux disease without esophagitis: Secondary | ICD-10-CM | POA: Diagnosis not present

## 2024-08-25 DIAGNOSIS — J9601 Acute respiratory failure with hypoxia: Secondary | ICD-10-CM | POA: Diagnosis not present

## 2024-08-25 DIAGNOSIS — R933 Abnormal findings on diagnostic imaging of other parts of digestive tract: Secondary | ICD-10-CM | POA: Diagnosis not present

## 2024-08-25 DIAGNOSIS — J9602 Acute respiratory failure with hypercapnia: Secondary | ICD-10-CM | POA: Diagnosis not present

## 2024-08-25 DIAGNOSIS — Z905 Acquired absence of kidney: Secondary | ICD-10-CM | POA: Diagnosis not present

## 2024-08-25 DIAGNOSIS — E1165 Type 2 diabetes mellitus with hyperglycemia: Secondary | ICD-10-CM | POA: Diagnosis not present

## 2024-08-25 DIAGNOSIS — K8591 Acute pancreatitis with uninfected necrosis, unspecified: Secondary | ICD-10-CM | POA: Diagnosis not present

## 2024-08-25 DIAGNOSIS — E1110 Type 2 diabetes mellitus with ketoacidosis without coma: Secondary | ICD-10-CM | POA: Diagnosis not present

## 2024-08-25 DIAGNOSIS — I10 Essential (primary) hypertension: Secondary | ICD-10-CM | POA: Diagnosis not present

## 2024-08-25 DIAGNOSIS — I2510 Atherosclerotic heart disease of native coronary artery without angina pectoris: Secondary | ICD-10-CM | POA: Diagnosis not present

## 2024-08-25 DIAGNOSIS — K859 Acute pancreatitis without necrosis or infection, unspecified: Secondary | ICD-10-CM | POA: Diagnosis not present

## 2024-08-25 LAB — CBC
HCT: 34.6 % — ABNORMAL LOW (ref 36.0–46.0)
Hemoglobin: 11.2 g/dL — ABNORMAL LOW (ref 12.0–15.0)
MCH: 28.9 pg (ref 26.0–34.0)
MCHC: 32.4 g/dL (ref 30.0–36.0)
MCV: 89.2 fL (ref 80.0–100.0)
Platelets: 128 10*3/uL — ABNORMAL LOW (ref 150–400)
RBC: 3.88 MIL/uL (ref 3.87–5.11)
RDW: 17.2 % — ABNORMAL HIGH (ref 11.5–15.5)
WBC: 7.5 10*3/uL (ref 4.0–10.5)
nRBC: 0 % (ref 0.0–0.2)

## 2024-08-25 LAB — GLUCOSE, CAPILLARY
Glucose-Capillary: 146 mg/dL — ABNORMAL HIGH (ref 70–99)
Glucose-Capillary: 157 mg/dL — ABNORMAL HIGH (ref 70–99)
Glucose-Capillary: 159 mg/dL — ABNORMAL HIGH (ref 70–99)
Glucose-Capillary: 181 mg/dL — ABNORMAL HIGH (ref 70–99)
Glucose-Capillary: 201 mg/dL — ABNORMAL HIGH (ref 70–99)
Glucose-Capillary: 212 mg/dL — ABNORMAL HIGH (ref 70–99)
Glucose-Capillary: 222 mg/dL — ABNORMAL HIGH (ref 70–99)

## 2024-08-25 LAB — BASIC METABOLIC PANEL WITH GFR
Anion gap: 11 (ref 5–15)
Anion gap: 14 (ref 5–15)
BUN: 26 mg/dL — ABNORMAL HIGH (ref 6–20)
BUN: 27 mg/dL — ABNORMAL HIGH (ref 6–20)
CO2: 23 mmol/L (ref 22–32)
CO2: 24 mmol/L (ref 22–32)
Calcium: 8.2 mg/dL — ABNORMAL LOW (ref 8.9–10.3)
Calcium: 8.6 mg/dL — ABNORMAL LOW (ref 8.9–10.3)
Chloride: 104 mmol/L (ref 98–111)
Chloride: 104 mmol/L (ref 98–111)
Creatinine, Ser: 0.78 mg/dL (ref 0.44–1.00)
Creatinine, Ser: 0.78 mg/dL (ref 0.44–1.00)
GFR, Estimated: >60 mL/min
GFR, Estimated: >60 mL/min
Glucose, Bld: 142 mg/dL — ABNORMAL HIGH (ref 70–99)
Glucose, Bld: 193 mg/dL — ABNORMAL HIGH (ref 70–99)
Potassium: 4.4 mmol/L (ref 3.5–5.1)
Potassium: 4.4 mmol/L (ref 3.5–5.1)
Sodium: 140 mmol/L (ref 135–145)
Sodium: 140 mmol/L (ref 135–145)

## 2024-08-25 LAB — HEPATIC FUNCTION PANEL
ALT: 40 U/L (ref 0–44)
AST: 101 U/L — ABNORMAL HIGH (ref 15–41)
Albumin: 3.2 g/dL — ABNORMAL LOW (ref 3.5–5.0)
Alkaline Phosphatase: 116 U/L (ref 38–126)
Bilirubin, Direct: 0.3 mg/dL — ABNORMAL HIGH (ref 0.0–0.2)
Indirect Bilirubin: 0.2 mg/dL — ABNORMAL LOW (ref 0.3–0.9)
Total Bilirubin: 0.5 mg/dL (ref 0.0–1.2)
Total Protein: 6.5 g/dL (ref 6.5–8.1)

## 2024-08-25 LAB — BLOOD GAS, ARTERIAL
Acid-base deficit: 0.3 mmol/L (ref 0.0–2.0)
Bicarbonate: 27.7 mmol/L (ref 20.0–28.0)
O2 Saturation: 97.1 %
Patient temperature: 37.7
pCO2 arterial: 61 mmHg — ABNORMAL HIGH (ref 32–48)
pH, Arterial: 7.27 — ABNORMAL LOW (ref 7.35–7.45)
pO2, Arterial: 98 mmHg (ref 83–108)

## 2024-08-25 LAB — PROCALCITONIN: Procalcitonin: 1.44 ng/mL

## 2024-08-25 LAB — PHOSPHORUS
Phosphorus: 1.4 mg/dL — ABNORMAL LOW (ref 2.5–4.6)
Phosphorus: 2.6 mg/dL (ref 2.5–4.6)

## 2024-08-25 LAB — LACTIC ACID, PLASMA: Lactic Acid, Venous: 0.7 mmol/L (ref 0.5–1.9)

## 2024-08-25 LAB — TRIGLYCERIDES: Triglycerides: 229 mg/dL — ABNORMAL HIGH

## 2024-08-25 LAB — BETA-HYDROXYBUTYRIC ACID: Beta-Hydroxybutyric Acid: 0.35 mmol/L — ABNORMAL HIGH (ref 0.05–0.27)

## 2024-08-25 LAB — MAGNESIUM: Magnesium: 2.4 mg/dL (ref 1.7–2.4)

## 2024-08-25 LAB — PRO BRAIN NATRIURETIC PEPTIDE: Pro Brain Natriuretic Peptide: 205 pg/mL

## 2024-08-25 LAB — LIPASE, BLOOD: Lipase: 67 U/L — ABNORMAL HIGH (ref 11–51)

## 2024-08-25 MED ORDER — FUROSEMIDE 10 MG/ML IJ SOLN
80.0000 mg | Freq: Once | INTRAMUSCULAR | Status: AC
  Administered 2024-08-25: 80 mg via INTRAVENOUS
  Filled 2024-08-25: qty 8

## 2024-08-25 MED ORDER — ACETAMINOPHEN 325 MG PO TABS
650.0000 mg | ORAL_TABLET | Freq: Four times a day (QID) | ORAL | Status: AC | PRN
  Administered 2024-08-25 – 2024-08-27 (×3): 650 mg via ORAL
  Filled 2024-08-25 (×3): qty 2

## 2024-08-25 MED ORDER — SODIUM PHOSPHATES 45 MMOLE/15ML IV SOLN
45.0000 mmol | Freq: Once | INTRAVENOUS | Status: AC
  Administered 2024-08-25: 45 mmol via INTRAVENOUS
  Filled 2024-08-25: qty 15

## 2024-08-25 MED ORDER — CHLORHEXIDINE GLUCONATE CLOTH 2 % EX PADS
6.0000 | MEDICATED_PAD | Freq: Every day | CUTANEOUS | Status: AC
  Administered 2024-08-25 – 2024-08-31 (×7): 6 via TOPICAL

## 2024-08-25 MED ORDER — BUDESONIDE 0.5 MG/2ML IN SUSP
0.5000 mg | Freq: Two times a day (BID) | RESPIRATORY_TRACT | Status: AC
  Administered 2024-08-25 – 2024-08-31 (×12): 0.5 mg via RESPIRATORY_TRACT
  Filled 2024-08-25 (×12): qty 2

## 2024-08-25 MED ORDER — HYDRALAZINE HCL 20 MG/ML IJ SOLN: 10.0000 mg | Freq: Four times a day (QID) | INTRAMUSCULAR | Status: AC | PRN

## 2024-08-25 MED ORDER — ARFORMOTEROL TARTRATE 15 MCG/2ML IN NEBU
15.0000 ug | INHALATION_SOLUTION | Freq: Two times a day (BID) | RESPIRATORY_TRACT | Status: AC
  Administered 2024-08-25 – 2024-08-31 (×11): 15 ug via RESPIRATORY_TRACT
  Filled 2024-08-25 (×10): qty 2

## 2024-08-25 MED ORDER — IPRATROPIUM-ALBUTEROL 0.5-2.5 (3) MG/3ML IN SOLN
3.0000 mL | RESPIRATORY_TRACT | Status: AC
  Administered 2024-08-25: 3 mL via RESPIRATORY_TRACT
  Filled 2024-08-25: qty 3

## 2024-08-25 MED ORDER — DEXMEDETOMIDINE HCL IN NACL 400 MCG/100ML IV SOLN
0.0000 ug/kg/h | INTRAVENOUS | Status: DC
  Administered 2024-08-25: 0.4 ug/kg/h via INTRAVENOUS
  Administered 2024-08-26: 1 ug/kg/h via INTRAVENOUS
  Administered 2024-08-26: 0.9 ug/kg/h via INTRAVENOUS
  Administered 2024-08-26 – 2024-08-27 (×5): 1 ug/kg/h via INTRAVENOUS
  Administered 2024-08-27: 0.7 ug/kg/h via INTRAVENOUS
  Administered 2024-08-27: 1.2 ug/kg/h via INTRAVENOUS
  Administered 2024-08-27 – 2024-08-28 (×2): 0.5 ug/kg/h via INTRAVENOUS
  Filled 2024-08-25 (×2): qty 100
  Filled 2024-08-25: qty 200
  Filled 2024-08-25: qty 100
  Filled 2024-08-25: qty 200
  Filled 2024-08-25 (×5): qty 100

## 2024-08-25 MED ORDER — FUROSEMIDE 10 MG/ML IJ SOLN
60.0000 mg | Freq: Once | INTRAMUSCULAR | Status: AC
  Administered 2024-08-25: 60 mg via INTRAVENOUS
  Filled 2024-08-25: qty 6

## 2024-08-25 NOTE — Progress Notes (Signed)
 RT NOTE: RT notified by RN that PT had arrived to unit. Upon arrival to PT room, this RT was informed that no RT transported PT from 5W to [place]. RN transported PT to room on bipap. When RT arrived to room, bipap machine connected to oxygen  but power cord not plugged in and machine running on battery power. This RT plugged machine into red power outlet. Mask exchanged from size medium to size large due to leak. Vitals are stable. RT will continue to monitor.  08/25/24 1019  BiPAP/CPAP/SIPAP  $ Face Mask Large  Yes  BiPAP/CPAP/SIPAP Pt Type Adult  BiPAP/CPAP/SIPAP SERVO  Mask Type Full face mask  Dentures removed? Not applicable  Mask Size Large  Set Rate 16 breaths/min  Respiratory Rate 24 breaths/min  IPAP 10 cmH20  EPAP 5 cmH2O  PEEP 5 cmH20  FiO2 (%) 60 %  Minute Ventilation 12  Leak 23  Peak Inspiratory Pressure (PIP) 18  Tidal Volume (Vt) 581  Patient Home Machine No  Patient Home Mask No  Patient Home Tubing No  Auto Titrate No  Press High Alarm 30 cmH2O  Device Plugged into RED Power Outlet Yes  BiPAP/CPAP /SiPAP Vitals  Resp (!) 22  Bilateral Breath Sounds Diminished

## 2024-08-25 NOTE — Progress Notes (Signed)
 Placed an order for a 1:1 sitter. Patient is drowsy but agitated, trying to pull out tubings and want to get out of the bed. Initially Aox3  but eventually oriented to self after taking her night pills. Held her trazodone  as she was lethargic earlier the day and received anti-anxiety and anti-depressant medications as scheduled night dose.Still can't keep her on the CPAP as she keeps on pulling it off. At 2330, available sitter was in place and Bipap was set-up by RT.

## 2024-08-25 NOTE — Progress Notes (Signed)
 eLink Physician-Brief Progress Note Patient Name: Jamie [name] DOB: Aug 09, 1970 MRN: [id number]   Date of Service  08/25/2024  HPI/Events of Note  Increased WOB Not wearing BIPAP consistently possible mask issue Increased wheezing  eICU Interventions  Lasix  x 1 additional Duoneb x 1 BIPAP + precedex  trial Will start empiric LABA/LAMA as well but could just be cardiac/obeisty wheeze esp with her PFTs Will order a limited echo, would not be surprised if early cor pulmonale     Intervention Category Major Interventions: Hypoxemia - evaluation and management  Toribio JAYSON Sharps 08/25/2024, 9:04 PM

## 2024-08-25 NOTE — Plan of Care (Signed)
  Problem: Education: Goal: Knowledge of General Education information will improve Description: Including pain rating scale, medication(s)/side effects and non-pharmacologic comfort measures Outcome: Progressing   Problem: Health Behavior/Discharge Planning: Goal: Ability to manage health-related needs will improve Outcome: Progressing   Problem: Clinical Measurements: Goal: Ability to maintain clinical measurements within normal limits will improve Outcome: Progressing Goal: Respiratory complications will improve Outcome: Progressing   Problem: Activity: Goal: Risk for activity intolerance will decrease Outcome: Progressing   Problem: Nutrition: Goal: Adequate nutrition will be maintained Outcome: Progressing   Problem: Safety: Goal: Ability to remain free from injury will improve Outcome: Progressing

## 2024-08-25 NOTE — Plan of Care (Signed)

## 2024-08-25 NOTE — Progress Notes (Addendum)
 Patient ID: Jamie [name], female   DOB: [birth year], 54 y.o.   MRN: [id number]    Progress Note   Subjective  Day # 2 CC;acute recurrent pancreatitis  Labs; WBC 7.5/hemoglobin 11.2/hematocrit 34.6/MCV 89.2 TBili 0.5/AST 101/ALT 40/alk phos 116 Triglycerides 229 Bmet pending  Patient has had further respiratory decline and continued intermittent lethargy, hypoxemia now requiring BiPAP and transferring to ICU  Tmax 100 Tachycardic to 120  Awake currently and able to answer a few questions says her abdomen is feeling better not having any severe pain, no vomiting, feels short of breath     Objective   Vital signs in last 24 hours: Temp:  [97.8 F (36.6 C)-99.9 F (37.7 C)] 99.9 F (37.7 C) (01/31 0742) Pulse Rate:  [101-120] 120 (01/31 0742) Resp:  [15-24] 22 (01/31 0742) BP: (108-152)/(65-98) 152/98 (01/31 0742) SpO2:  [90 %-96 %] 96 % (01/31 0742) FiO2 (%):  [60 %] 60 % (01/31 0402) Last BM Date : 08/24/24 General:   older  white female on Bipap, acutely ill-appearing, currently alert and able to answer questions Heart: Tachycardic regular rate and rhythm; no murmurs Lungs: Decreased breath sounds bilaterally Abdomen: Obese, soft, nontender , nondistended.  Some tenderness across the upper abdomen but no guarding or rebound few bowel sounds are present Extremities:  Without edema. Neurologic:  Alert and oriented, Psych:  Cooperative.   Intake/Output from previous day: 01/30 0701 - 01/31 0700 In: 881.1 [P.O.:170; I.V.:711.1] Out: -  Intake/Output this shift: No intake/output data recorded.  Lab Results: Recent Labs    08/24/24 0010 08/24/24 0536 08/25/24 0553  WBC 10.4 9.2 7.5  HGB 13.5 12.8 11.2*  HCT 41.5 39.5 34.6*  PLT 151 160 128*   BMET Recent Labs    08/24/24 1438 08/24/24 1958 08/24/24 2355  NA 138 140 140  K 4.2 4.2 4.4  CL 103 104 104  CO2 23 25 24   GLUCOSE 235* 132* 142*  BUN 27* 26* 26*  CREATININE 0.93 0.86 0.78  CALCIUM  8.0*  8.0* 8.6*   LFT Recent Labs    08/25/24 0553  PROT 6.5  ALBUMIN  3.2*  AST 101*  ALT 40  ALKPHOS 116  BILITOT 0.5  BILIDIR 0.3*  IBILI 0.2*   PT/INR No results for input(s): LABPROT, INR in the last 72 hours.       Assessment / Plan:    #44 54 year old white female with history of recurrent pancreatitis thought secondary to hypertriglyceridemia who presented to [facility], 3 days ago with recurrent abdominal pain which had been present progressively over the previous 2 weeks then developed associated nausea and vomiting   CT imaging there showed marked peripancreatic edema and induration suggestive of acute pancreatitis and subtle foci of hypoenhancement of the body and the tail concerning for possible pancreatic necrosis, there is ill-defined edema in the porta hepatis and into the right paracolic gutter, diffuse fatty liver changes.  Patient unable to lie flat for MRCP yesterday so aborted, has had progressive respiratory issues over the past 24 hours and now requiring BiPAP this morning.  Being transferred to ICU  #2 sleep apnea #3 diabetes mellitus-DKA criteria met, on insulin  drip which was transitioned off late yesterday #4 depression/anxiety/bipolar disorder  Plan; okay for clear to full liquid diet from GI perspective Continue to trend daily labs Will need repeat CT with contrast versus MRI/MRCP when she has able lysed from a respiratory perspective GI will continue to follow with you   Principal Problem:   Acute  pancreatitis Active Problems:   OSA on CPAP   GERD (gastroesophageal reflux disease)   DM2 (diabetes mellitus, type 2) (HCC)   Hypertriglyceridemia without hypercholesterolemia   Essential hypertension   Chronic pancreatitis (HCC)   CAD (coronary artery disease)   Generalized abdominal pain   Bilious vomiting with nausea   Acute recurrent pancreatitis     LOS: 2 days   Amy EsterwoodPA-C  08/25/2024, 8:31 AM      Attending  Physician's Attestation   I have taken an interval history, reviewed the chart and examined the patient.   Patient remains critically ill now transferred to the ICU due to increasing respiratory issues.  She continues to have significant abdominal pain and discomfort.  On examination today she is fatigued and tired resting her head on the table.  Laboratories show overall stability with good lactic acid levels.  Will check inflammatory markers tomorrow to see where things stand overall and her clinical picture.  If alimentation is not occurring into tomorrow we need to consider core track on Monday for nutritional supplementation.  I agree with the Advanced Practitioner's note, impression, and recommendations with updates and my documentation as noted above.  The majority of the medical decision making/process, formulation of the impression/plan of action for the patient were performed by me with substantive portion of this encounter (>50% time spent including complete performance of at least one of the key components of MDM, History, and/or Exam).   Aloha Finner, MD [facility]oscopy Office # [phone]

## 2024-08-25 NOTE — Consult Note (Signed)
 "  NAME:  Jamie [name], MRN:  [id number], DOB:  08-23-1970, LOS: 2 ADMISSION DATE:  08/23/2024, CONSULTATION DATE: 08/25/2024 REFERRING MD: TRH, CHIEF COMPLAINT: Encephalopathy  History of Present Illness:  Jamie [name] is a 54 year old female with a past medical history significant for HTN, HLD, OSA on CPAP, recurrent pancreatitis felt secondary to hypertriglyceridemia,type 2 diabetes, bipolar 1 disorder, anxiety presented to [facility] as an outside transfer from [facility]  Initial complaint on admission to OSH was abdominal pain with associated nausea and vomiting.  CT at outside hospital with acute pancreatitis and early signs of necrosis given concern for significant pancreatitis patient was accepted for transfer to [facility]..   On arrival to [facility] patient was seen mildly tachypneic, tachycardic, and slightly hypertensive.  Significant lab work on arrival included glucose 327, CO2 17, calcium  7.5, anion gap 22, lipase 262, triglycerides 315.  On 1/30 patient received 0.5 mg Dilaudid  in order to undergo MRCP but patient became lethargic and required application of BiPAP.  By 1/31 patient remains encephalopathic and noncompliant with therapies including therefore PCCM consulted for assistance.  Pertinent  Medical History  HTN, HLD, OSA on CPAP, recurrent pancreatitis felt secondary to hypertriglyceridemia,type 2 diabetes, bipolar 1 disorder, anxiety  Significant Hospital Events: Including procedures, antibiotic start and stop dates in addition to other pertinent events   1/30 presented from outside hospital for ongoing management at severe pancreatitis, and DKA on arrival 1/31 progressive hypoxic and hypercapnic respiratory failure requiring BiPAP dependence prompting PCCM consult and transfer to ICU  Interim History / Subjective:  Able to open eyes and seen spontaneously moving extremities but encephalopathic  Objective    Blood pressure (!) 152/98, pulse (!) 120,  temperature 99.9 F (37.7 C), temperature source Axillary, resp. rate (!) 22, height 5' 3 (1.6 m), weight 109 kg, SpO2 96%.    Vent Mode: BIPAP;Other (Comment) FiO2 (%):  [60 %] 60 % Set Rate:  [16 bmp] 16 bmp PEEP:  [5 cmH20] 5 cmH20   Intake/Output Summary (Last 24 hours) at 08/25/2024 9247 Last data filed at 08/24/2024 2100 Gross per 24 hour  Intake 881.1 ml  Output --  Net 881.1 ml   Filed Weights   08/23/24 1940 08/23/24 2200  Weight: 109 kg 109 kg    Examination: General: Acute on chronic ill-appearing obese middle-aged female lying in bed BiPAP dependent in no acute distress HEENT: BiPAP mask in place, MM pink/moist, PERRL,  Neuro: Encephalopathic, seen moving all extremities CV: s1s2 regular rate and rhythm, no murmur, rubs, or gallops,  PULM: Diminished bilaterally, tolerating BiPAP, no increased work of breathing GI: soft, bowel sounds active in all 4 quadrants, non-tender, non-distended Extremities: warm/dry, generalized edema  Skin: no rashes or lesions   Resolved problem list  DKA  Assessment and Plan  Acute hypoxic and hypercapnic respiratory failure History of sleep apnea - ABG repeated 1/31 with pH 7.2 pCO2 61.  Chest x-ray concerning for volume overload P: Transfer to ICU for close monitoring Continue BiPAP N.p.o. Head of bed elevated 30 degrees IV Lasix  Trend CBC and fever curve Follow intermittent chest x-ray and ABG.   Ensure adequate pulmonary hygiene   Acute pancreatitis with signs of early necrosis -CT abdomen and pelvis done at outside hospital revealed marked Peripancreatic edema and induration suggesting acute pancreatitis with subtle signs of necrosis Hypertriglyceridemia -Per outside record review patient has had multiple episodes of pancreatitis felt secondary to hypertriglyceridemia.  At baseline she is on fenofibrate , Crestor , Zen Pep  P: GI following, appreciate assistance MRCP pending patient's ability to comply with exam Trend  triglyceride levels Consider potential core track placement Trend lipase Optimize pain control as able  Type 2 diabetes uncontrolled - On arrival from outside hospital patient was hyperglycemic with open anion gap and elevated beta hydroxybutyric acid as well.  She was placed on insulin  drip.  Drip transitioned off by 131 P: Continue SSI CBG goal 140-180 CBG checks every 4  Hypertension P: Oral medications on hold given n.p.o. status As needed IV antihypertensives  Nonobstructive CAD -Previous echocardiogram August 2024 with a EF of 60-65, no WMA and normal RV function P: Follow-up echocardiogram Optimize electrolytes Continuous telemetry  Depression and anxiety Patient is on multiple antianxiety and sedating medications at baseline P: Given decreased mentation hold home oral agents However will need to resume home benzodiazepine within the next 12 to 24 hours given risk of withdrawal  Peripheral neuropathy P: Resume home gabapentin  when able  GERD  P:  Labs   CBC: Recent Labs  Lab 08/24/24 0010 08/24/24 0536 08/25/24 0553  WBC 10.4 9.2 7.5  NEUTROABS 8.4*  --   --   HGB 13.5 12.8 11.2*  HCT 41.5 39.5 34.6*  MCV 89.2 89.8 89.2  PLT 151 160 128*    Basic Metabolic Panel: Recent Labs  Lab 08/24/24 0619 08/24/24 1031 08/24/24 1349 08/24/24 1438 08/24/24 1958 08/24/24 2355  NA  --  138 139 138 140 140  K  --  4.6 4.7 4.2 4.2 4.4  CL  --  101 102 103 104 104  CO2  --  20* 21* 23 25 24   GLUCOSE  --  287* 239* 235* 132* 142*  BUN  --  25* 27* 27* 26* 26*  CREATININE  --  0.87 0.97 0.93 0.86 0.78  CALCIUM   --  7.9* 8.0* 8.0* 8.0* 8.6*  MG 2.1  --   --   --   --   --    GFR: Estimated Creatinine Clearance: 97.4 mL/min (by C-G formula based on SCr of 0.78 mg/dL). Recent Labs  Lab 08/24/24 0010 08/24/24 0536 08/25/24 0553  WBC 10.4 9.2 7.5  LATICACIDVEN 1.5 1.2  --     Liver Function Tests: Recent Labs  Lab 08/24/24 0010 08/24/24 0537  08/25/24 0553  AST 39 42* 101*  ALT 22 21 40  ALKPHOS 69 84 116  BILITOT 0.8 0.7 0.5  PROT 7.1 6.7 6.5  ALBUMIN  3.6 3.2* 3.2*   Recent Labs  Lab 08/24/24 0010  LIPASE 262*   No results for input(s): AMMONIA in the last 168 hours.  ABG    Component Value Date/Time   PHART 7.28 (L) 08/24/2024 1018   PCO2ART 43 08/24/2024 1018   PO2ART 80 (L) 08/24/2024 1018   HCO3 20.7 08/24/2024 1018   TCO2 31 12/05/2019 0723   ACIDBASEDEF 5.9 (H) 08/24/2024 1018   O2SAT 94.8 08/24/2024 1018     Coagulation Profile: No results for input(s): INR, PROTIME in the last 168 hours.  Cardiac Enzymes: No results for input(s): CKTOTAL, CKMB, CKMBINDEX, TROPONINI in the last 168 hours.  HbA1C: Hemoglobin A1C  Date/Time Value Ref Range Status  07/30/2024 02:29 PM 12.3 (A) 4.0 - 5.6 % Final  03/01/2024 03:10 PM 11.8 (A) 4.0 - 5.6 % Final   Hgb A1c MFr Bld  Date/Time Value Ref Range Status  01/26/2023 05:07 PM 10.1 (H) 4.8 - 5.6 % Final    Comment:    (NOTE) Pre diabetes:  5.7%-6.4%  Diabetes:              >6.4%  Glycemic control for   <7.0% adults with diabetes   05/19/2018 06:24 AM 6.2 (H) 4.8 - 5.6 % Final    Comment:    (NOTE) Pre diabetes:          5.7%-6.4% Diabetes:              >6.4% Glycemic control for   <7.0% adults with diabetes     CBG: Recent Labs  Lab 08/24/24 1937 08/24/24 2153 08/24/24 2304 08/25/24 0057 08/25/24 0358  GLUCAP 144* 95 127* 159* 146*    Review of Systems:   Unable to assess   Past Medical History:  She,  has a past medical history of Anxiety, Arthritis, Bipolar 1 disorder (HCC), Chronic diarrhea, Diverticulosis of colon, GERD (gastroesophageal reflux disease), Hiatal hernia, History of concussion, Hyperlipidemia, Hypertension, MDD (major depressive disorder), Migraine, OSA on CPAP, PCOS (polycystic ovarian syndrome), PONV (postoperative nausea and vomiting), and Type 2 diabetes mellitus (HCC).   Surgical History:    Past Surgical History:  Procedure Laterality Date   ANAL FISSURE REPAIR  x3  last one  2007  approx.   BIOPSY  09/06/2019   Procedure: BIOPSY;  Surgeon: Shaaron Lamar HERO, MD;  Location: [facility];  Service: Endoscopy;;   COLONOSCOPY WITH PROPOFOL  [name] 09/06/2019   pancolonic diverticulosis. Segmental biopsy completed with benign biopsies.    ESOPHAGOGASTRODUODENOSCOPY (EGD) WITH PROPOFOL  [name] 06/16/2020   Dr. Shaaron: normal esophagus s/p dilation, normal stomach, duodenum.    ESOPHAGOGASTRODUODENOSCOPY (EGD) WITH PROPOFOL  [name] 02/10/2022   Procedure: ESOPHAGOGASTRODUODENOSCOPY (EGD) WITH PROPOFOL ;  Surgeon: Shaaron Lamar HERO, MD;  Location: [facility];  Service: Endoscopy;  Laterality: [name];  9:45am   EVALUATION UNDER ANESTHESIA WITH HEMORRHOIDECTOMY [name] 12/05/2019   Procedure: ANORECTAL EXAM UNDER ANESTHESIA WITH HEMORRHOIDECTOMY, HEMORRHOIDAL LIGATION/PEXY;  Surgeon: Sheldon Standing, MD;  Location: [facility];  Service: General;  Laterality: [name];  GENERAL AND LOCAL   EVALUATION UNDER ANESTHESIA WITH TEAR DUCT PROBING  infant   KNEE ARTHROSCOPY Right 1994   LAPAROSCOPIC CHOLECYSTECTOMY  08-06-2002  @[facility]    LEFT HEART CATH AND CORONARY ANGIOGRAPHY [name] 10/03/2023   Procedure: LEFT HEART CATH AND CORONARY ANGIOGRAPHY;  Surgeon: Ladona Heinz, MD;  Location: [facility];  Service: Cardiovascular;  Laterality: [name];   LUMBAR LAMINECTOMY/DECOMPRESSION MICRODISCECTOMY Right 01/22/2014   Procedure: LUMBAR FOUR TO FIVE LUMBAR LAMINECTOMY/DECOMPRESSION MICRODISCECTOMY 1 LEVEL;  Surgeon: Catalina HERO Stains, MD;  Location: [facility];  Service: Neurosurgery;  Laterality: Right;  Right L45 diskectomy   MALONEY DILATION [name] 06/16/2020   Procedure: AGAPITO DILATION;  Surgeon: Shaaron Lamar HERO, MD;  Location: [facility];  Service: Endoscopy;  Laterality: [name];   MALONEY DILATION [name] 02/10/2022   Procedure: AGAPITO DILATION;  Surgeon: Shaaron Lamar HERO, MD;  Location: [facility];  Service:  Endoscopy;  Laterality: [name];     Social History:   reports that she has never smoked. She has never used smokeless tobacco. She reports that she does not drink alcohol  and does not use drugs.   Family History:  Her family history includes Anxiety disorder in her maternal grandmother; COPD in her father; Depression in her maternal grandmother. There is no history of Colon cancer or Colon polyps.   Allergies Allergies[1]   Home Medications  Prior to Admission medications  Medication Sig Start Date End Date Taking? Authorizing Provider  ACCU-CHEK GUIDE test strip  05/18/23  [provider]  albuterol  (VENTOLIN  HFA) 108 (90 Base) MCG/ACT inhaler Inhale 2 puffs into the lungs every 4 (four) hours as needed for wheezing or shortness of breath. 06/04/23   Cleotilde Rogue, MD  amLODipine  (NORVASC ) 5 MG tablet Take 1 tablet (5 mg total) by mouth daily. 10/03/23 02/09/25  Ladona Heinz, MD  ARIPiprazole  (ABILIFY ) 20 MG tablet Take 1 tablet (20 mg total) by mouth daily. 08/15/24   Plovsky, Elna, MD  clonazePAM  (KLONOPIN ) 1 MG tablet Take 1 mg in the morning and 2 mg at night 08/15/24   Plovsky, Elna, MD  Continuous Glucose Sensor (DEXCOM G7 SENSOR) MISC USE AS DIRECTED AND CHANGE EVERY 10 DAYS 07/30/24   [name] Benton PARAS, NP  dexlansoprazole  (DEXILANT ) 60 MG capsule Take 1 capsule (60 mg total) by mouth daily. 06/05/24   Kennedy Charmaine CROME, NP  Dextromethorphan-buPROPion  ER (AUVELITY ) 45-105 MG TBCR 1 qam for 1 week then 1 bid 08/15/24   Plovsky, Elna, MD  dicyclomine  (BENTYL ) 10 MG capsule Take 1 capsule (10 mg total) by mouth 2 (two) times daily as needed for spasms. 10/27/23   Kennedy Charmaine CROME, NP  DULoxetine  (CYMBALTA ) 60 MG capsule Take 2 capsules (120 mg total) by mouth at bedtime. 08/15/24   Plovsky, Elna, MD  fenofibrate  micronized (LOFIBRA) 134 MG capsule TAKE ONE CAPSULE BY MOUTH DAILY 01/05/24   Mallipeddi, Vishnu P, MD  fluticasone  (FLONASE) 50 MCG/ACT nasal spray Place 1 spray into  both nostrils at bedtime as needed for allergies or rhinitis.     [provider]  furosemide  (LASIX ) 20 MG tablet TAKE 1 TABLET BY MOUTH DAILY 05/24/24   Mallipeddi, Vishnu P, MD  gabapentin  (NEURONTIN ) 600 MG tablet Take 600 mg by mouth 2 (two) times daily. 05/18/23   [provider]  Insulin  Pen Needle (CARETOUCH PEN NEEDLES) 31G X 6 MM MISC Use to inject insulin  3 times daily 07/30/24   [name] Benton PARAS, NP  insulin  regular human CONCENTRATED (HUMULIN  R U-500 KWIKPEN) 500 UNIT/ML KwikPen Inject 90 Units into the skin 3 (three) times daily with meals. 07/30/24   [name] Benton PARAS, NP  metFORMIN  (GLUCOPHAGE -XR) 500 MG 24 hr tablet Take 1 tablet (500 mg total) by mouth daily with breakfast. 07/30/24   [name] Benton PARAS, NP  metoprolol  tartrate (LOPRESSOR ) 25 MG tablet TAKE 1 TABLET BY MOUTH TWICE DAILY 06/27/24   Mallipeddi, Vishnu P, MD  Multiple Vitamins-Minerals (MULTIVITAMIN WITH MINERALS) tablet Take 1 tablet by mouth daily.    [provider]  nitroGLYCERIN  (NITROSTAT ) 0.4 MG SL tablet Place 1 tablet (0.4 mg total) under the tongue every 5 (five) minutes x 3 doses as needed (if no relief after 3rd dose proceed to ED or call 911). 08/09/23   Mallipeddi, Vishnu P, MD  omega-3 acid ethyl esters (LOVAZA ) 1 g capsule Take 2 g by mouth 2 (two) times daily. 10/22/22   [provider]  omeprazole  (PRILOSEC) 40 MG capsule TAKE ONE CAPSULE BY MOUTH DAILY 02/02/24   Kennedy Charmaine L, NP  ondansetron  (ZOFRAN -ODT) 4 MG disintegrating tablet TAKE 1 TABLET BY MOUTH EVERY 8 HOURS AS NEEDED FOR NAUSEA AND VOMITING 06/05/24   Kennedy Charmaine L, NP  Pancrelipase , Lip-Prot-Amyl, (ZENPEP ) 40000-126000 units CPEP TAKE TWO CAPSULES BY MOUTH THREE TIMES DAILY WITH MEALS AND TAKE 1 CAPSULE with AT least 2 snacks 06/05/24   Kennedy Charmaine CROME, NP  ranolazine  (RANEXA ) 500 MG 12 hr tablet Take 1 tablet (500 mg total) by mouth 2 (two) times daily. 06/06/24  Miriam Norris, NP  rosuvastatin   (CRESTOR ) 10 MG tablet TAKE 1 TABLET BY MOUTH AT BEDTIME 07/05/24   Miriam Norris, NP  traZODone  (DESYREL ) 100 MG tablet Take 4 tablets (400 mg total) by mouth at bedtime. 08/15/24   Tasia Lung, MD  valsartan  (DIOVAN ) 80 MG tablet TAKE 1 TABLET BY MOUTH DAILY 05/24/24   Wert, Michael B, MD     Critical care time:   CRITICAL CARE Performed by: Abigal Choung D. Harris   Total critical care time: 42 minutes  Critical care time was exclusive of separately billable procedures and treating other patients.  Critical care was necessary to treat or prevent imminent or life-threatening deterioration.  Critical care was time spent personally by me on the following activities: development of treatment plan with patient and/or surrogate as well as nursing, discussions with consultants, evaluation of patient's response to treatment, examination of patient, obtaining history from patient or surrogate, ordering and performing treatments and interventions, ordering and review of laboratory studies, ordering and review of radiographic studies, pulse oximetry and re-evaluation of patient's condition.  Nickalas Mccarrick D. Harris, NP-C [facility] Personal contact information can be found on Amion  If no contact or response made please call [phone] 08/25/2024, 9:15 AM             [1]  Allergies Allergen Reactions   Codeine Hives and Swelling   Ozempic (0.25 Or 0.5 Mg-Dose) [Semaglutide(0.25 Or 0.5mg -Dos)]     pancreatitis   Morphine  And Codeine Swelling and Rash   "

## 2024-08-26 ENCOUNTER — Other Ambulatory Visit: Payer: Self-pay

## 2024-08-26 ENCOUNTER — Inpatient Hospital Stay (HOSPITAL_COMMUNITY)

## 2024-08-26 DIAGNOSIS — J9602 Acute respiratory failure with hypercapnia: Secondary | ICD-10-CM | POA: Diagnosis not present

## 2024-08-26 DIAGNOSIS — F418 Other specified anxiety disorders: Secondary | ICD-10-CM

## 2024-08-26 DIAGNOSIS — E1142 Type 2 diabetes mellitus with diabetic polyneuropathy: Secondary | ICD-10-CM

## 2024-08-26 DIAGNOSIS — E1165 Type 2 diabetes mellitus with hyperglycemia: Secondary | ICD-10-CM

## 2024-08-26 DIAGNOSIS — J9601 Acute respiratory failure with hypoxia: Secondary | ICD-10-CM | POA: Diagnosis not present

## 2024-08-26 DIAGNOSIS — K219 Gastro-esophageal reflux disease without esophagitis: Secondary | ICD-10-CM | POA: Diagnosis not present

## 2024-08-26 DIAGNOSIS — R0603 Acute respiratory distress: Secondary | ICD-10-CM

## 2024-08-26 DIAGNOSIS — Z794 Long term (current) use of insulin: Secondary | ICD-10-CM

## 2024-08-26 DIAGNOSIS — I10 Essential (primary) hypertension: Secondary | ICD-10-CM | POA: Diagnosis not present

## 2024-08-26 DIAGNOSIS — K8591 Acute pancreatitis with uninfected necrosis, unspecified: Secondary | ICD-10-CM | POA: Diagnosis not present

## 2024-08-26 DIAGNOSIS — E870 Hyperosmolality and hypernatremia: Secondary | ICD-10-CM

## 2024-08-26 DIAGNOSIS — E1110 Type 2 diabetes mellitus with ketoacidosis without coma: Secondary | ICD-10-CM | POA: Diagnosis not present

## 2024-08-26 DIAGNOSIS — I2510 Atherosclerotic heart disease of native coronary artery without angina pectoris: Secondary | ICD-10-CM

## 2024-08-26 DIAGNOSIS — E781 Pure hyperglyceridemia: Secondary | ICD-10-CM | POA: Diagnosis not present

## 2024-08-26 DIAGNOSIS — K859 Acute pancreatitis without necrosis or infection, unspecified: Secondary | ICD-10-CM | POA: Diagnosis not present

## 2024-08-26 LAB — COMPREHENSIVE METABOLIC PANEL WITH GFR
ALT: 60 U/L — ABNORMAL HIGH (ref 0–44)
AST: 95 U/L — ABNORMAL HIGH (ref 15–41)
Albumin: 3.2 g/dL — ABNORMAL LOW (ref 3.5–5.0)
Alkaline Phosphatase: 141 U/L — ABNORMAL HIGH (ref 38–126)
Anion gap: 13 (ref 5–15)
BUN: 32 mg/dL — ABNORMAL HIGH (ref 6–20)
CO2: 28 mmol/L (ref 22–32)
Calcium: 8.3 mg/dL — ABNORMAL LOW (ref 8.9–10.3)
Chloride: 105 mmol/L (ref 98–111)
Creatinine, Ser: 0.94 mg/dL (ref 0.44–1.00)
GFR, Estimated: >60 mL/min
Glucose, Bld: 234 mg/dL — ABNORMAL HIGH (ref 70–99)
Potassium: 3.7 mmol/L (ref 3.5–5.1)
Sodium: 146 mmol/L — ABNORMAL HIGH (ref 135–145)
Total Bilirubin: 0.5 mg/dL (ref 0.0–1.2)
Total Protein: 6.5 g/dL (ref 6.5–8.1)

## 2024-08-26 LAB — CBC
HCT: 33.6 % — ABNORMAL LOW (ref 36.0–46.0)
Hemoglobin: 10.9 g/dL — ABNORMAL LOW (ref 12.0–15.0)
MCH: 28.9 pg (ref 26.0–34.0)
MCHC: 32.4 g/dL (ref 30.0–36.0)
MCV: 89.1 fL (ref 80.0–100.0)
Platelets: 118 10*3/uL — ABNORMAL LOW (ref 150–400)
RBC: 3.77 MIL/uL — ABNORMAL LOW (ref 3.87–5.11)
RDW: 17.2 % — ABNORMAL HIGH (ref 11.5–15.5)
WBC: 5.8 10*3/uL (ref 4.0–10.5)
nRBC: 0 % (ref 0.0–0.2)

## 2024-08-26 LAB — ECHOCARDIOGRAM COMPLETE
Area-P 1/2: 3.31 cm2
Calc EF: 63.1 %
Height: 63 in
S' Lateral: 2.9 cm
Single Plane A2C EF: 62 %
Single Plane A4C EF: 64.2 %
Weight: 3844.82 [oz_av]

## 2024-08-26 LAB — SURGICAL PCR SCREEN
MRSA, PCR: NEGATIVE
Staphylococcus aureus: POSITIVE — AB

## 2024-08-26 LAB — GLUCOSE, CAPILLARY
Glucose-Capillary: 253 mg/dL — ABNORMAL HIGH (ref 70–99)
Glucose-Capillary: 225 mg/dL — ABNORMAL HIGH (ref 70–99)
Glucose-Capillary: 214 mg/dL — ABNORMAL HIGH (ref 70–99)
Glucose-Capillary: 263 mg/dL — ABNORMAL HIGH (ref 70–99)
Glucose-Capillary: 218 mg/dL — ABNORMAL HIGH (ref 70–99)

## 2024-08-26 LAB — SEDIMENTATION RATE: Sed Rate: 94 mm/h — ABNORMAL HIGH (ref 0–22)

## 2024-08-26 LAB — C-REACTIVE PROTEIN: CRP: 14.3 mg/dL — ABNORMAL HIGH

## 2024-08-26 MED ORDER — ARIPIPRAZOLE 10 MG PO TABS
20.0000 mg | ORAL_TABLET | Freq: Every day | ORAL | Status: AC
  Administered 2024-08-26 – 2024-08-31 (×6): 20 mg via ORAL
  Filled 2024-08-26 (×6): qty 2

## 2024-08-26 MED ORDER — PERFLUTREN LIPID MICROSPHERE
1.0000 mL | INTRAVENOUS | Status: AC | PRN
  Administered 2024-08-26: 3 mL via INTRAVENOUS

## 2024-08-26 MED ORDER — INSULIN GLARGINE-YFGN 100 UNIT/ML ~~LOC~~ SOLN
5.0000 [IU] | Freq: Two times a day (BID) | SUBCUTANEOUS | Status: DC
  Administered 2024-08-26: 5 [IU] via SUBCUTANEOUS
  Filled 2024-08-26 (×2): qty 0.05

## 2024-08-26 MED ORDER — TRAZODONE HCL 50 MG PO TABS
100.0000 mg | ORAL_TABLET | Freq: Every day | ORAL | Status: AC
  Administered 2024-08-26 – 2024-08-31 (×6): 100 mg via ORAL
  Filled 2024-08-26 (×2): qty 2
  Filled 2024-08-26: qty 1
  Filled 2024-08-26: qty 2
  Filled 2024-08-26 (×2): qty 1

## 2024-08-26 MED ORDER — DULOXETINE HCL 60 MG PO CPEP
120.0000 mg | ORAL_CAPSULE | Freq: Every day | ORAL | Status: AC
  Administered 2024-08-26 – 2024-08-31 (×6): 120 mg via ORAL
  Filled 2024-08-26 (×6): qty 2

## 2024-08-26 MED ORDER — INSULIN GLARGINE-YFGN 100 UNIT/ML ~~LOC~~ SOLN
5.0000 [IU] | Freq: Two times a day (BID) | SUBCUTANEOUS | Status: DC
  Administered 2024-08-26 – 2024-08-28 (×6): 5 [IU] via SUBCUTANEOUS
  Filled 2024-08-26 (×8): qty 0.05

## 2024-08-26 MED ORDER — GABAPENTIN 300 MG PO CAPS
300.0000 mg | ORAL_CAPSULE | Freq: Two times a day (BID) | ORAL | Status: AC
  Administered 2024-08-26 – 2024-08-31 (×12): 300 mg via ORAL
  Filled 2024-08-26 (×12): qty 1

## 2024-08-26 NOTE — Progress Notes (Signed)
" °  Echocardiogram 2D Echocardiogram has been performed.  Koleen KANDICE Popper, RDCS 08/26/2024, 11:50 AM "

## 2024-08-26 NOTE — Progress Notes (Addendum)
 Patient ID: Jamie [name], female   DOB: Jun 24, 1971, 54 y.o.   MRN: [id number]    Progress Note   Subjective   Day # 3 CC; recurrent acute severe pancreatitis felt secondary to hypertriglyceridemia  Patient on BiPAP now, requiring Precedex , intermittently taking off BiPAP and trying to get out of bed Awake, continues to complain of abdominal pain but says better than on admission, no nausea, no vomiting Tmax 101.8  Labs today-WBC 5.8/Hb 10.9/hematocrit 33.6 Sodium 146/potassium 3.7/BUN 32/creatinine 0.94 T. bili 0.5/alk phos 141/AST 95/ALT 60 Sed rate 94 CRP 14.3-trending down    Objective   Vital signs in last 24 hours: Temp:  [98.1 F (36.7 C)-101.8 F (38.8 C)] 101.8 F (38.8 C) (01/31 2000) Pulse Rate:  [74-120] 84 (02/01 0934) Resp:  [10-35] 23 (02/01 0859) BP: (97-151)/(64-92) 151/84 (02/01 0934) SpO2:  [90 %-98 %] 94 % (02/01 0900) FiO2 (%):  [40 %-60 %] 40 % (02/01 0900) Last BM Date : 08/24/24 General: Older white female, ill-appearing, on BiPAP, alert and able to answer a few questions Heart:  Regular rate and rhythm; no murmurs Lungs: Decreased breath sounds bilaterally, no wheezes heard at present Abdomen: Obese, soft, tender across the upper abdomen but no guarding or rebound, bowel sounds are present Extremities:  Without edema. Neurologic:  Alert and orientedx2   Intake/Output from previous day: 01/31 0701 - 02/01 0700 In: 356.2 [I.V.:91.1; IV Piggyback:265.1] Out: 1150 [Urine:1150] Intake/Output this shift: No intake/output data recorded.  Lab Results: Recent Labs    08/24/24 0536 08/25/24 0553 08/26/24 0705  WBC 9.2 7.5 5.8  HGB 12.8 11.2* 10.9*  HCT 39.5 34.6* 33.6*  PLT 160 128* 118*   BMET Recent Labs    08/24/24 2355 08/25/24 0928 08/26/24 0705  NA 140 140 146*  K 4.4 4.4 3.7  CL 104 104 105  CO2 24 23 28   GLUCOSE 142* 193* 234*  BUN 26* 27* 32*  CREATININE 0.78 0.78 0.94  CALCIUM  8.6* 8.2* 8.3*   LFT Recent Labs     08/25/24 0553 08/26/24 0705  PROT 6.5 6.5  ALBUMIN  3.2* 3.2*  AST 101* 95*  ALT 40 60*  ALKPHOS 116 141*  BILITOT 0.5 0.5  BILIDIR 0.3*  --   IBILI 0.2*  --    PT/INR No results for input(s): LABPROT, INR in the last 72 hours.  Studies/Results: DG Chest Port 1V same Day Result Date: 08/25/2024 EXAM: 1 VIEW(S) XRAY OF THE CHEST 08/25/2024 08:23:00 AM COMPARISON: 05/29/2024 CLINICAL HISTORY: [id number] Shortness of breath. FINDINGS: LUNGS AND PLEURA: Low lung volumes with bronchovascular crowding. Mild pulmonary edema. Hazy opacities at lung bases. Possible small pleural effusions. No pneumothorax. HEART AND MEDIASTINUM: Cardiomegaly. No acute abnormality of the mediastinal silhouette. BONES AND SOFT TISSUES: No acute osseous abnormality. IMPRESSION: 1. Low lung volumes with bronchovascular crowding, mild pulmonary edema, and hazy opacities at lung bases. 2. Possible small pleural effusions. 3. Cardiomegaly. Electronically signed by: Waddell Calk MD 08/25/2024 09:03 AM EST RP Workstation: [id number]       Assessment / Plan:    #55 54 year old white female with progressive acute hypoxic respiratory failure in setting of obesity and sleep apnea Transferred to ICU yesterday, on BiPAP Requiring sedation with Precedex  as intermittently pulling off BiPAP and trying to get out of bed.  #2 acute recurrent acute pancreatitis felt secondary to hypertriglyceridemia.,  Initial triglyceride documented in 300 range, however with previous episodes had had markedly elevated triglycerides and she did not have a triglyceride level done on  initial presentation to [facility] ER where she resided for couple of days prior to transferring here.  Was concern on initial CT imaging for a few small foci of hypoenhancement concerning for potential pancreatic necrosis.  Patient was unable to tolerate lying flat for MRCP yesterday so canceled  Parameters are all stable no leukocytosis, CRP trending  down Febrile today ,new  No current antibiotics  #3 nutrition-discussed with CCM to allow her to try clear liquids today during breaks from BiPAP, if unable to tolerate then will place core track tomorrow  #4 poorly controlled diabetes mellitus-DKA on admit #5 nonobstructive coronary artery disease #6.  Depression and anxiety-had been on multiple psychiatric medicines prior to admission, may have been contributing to her confusion/encephalopathy  #7 Metabolic encephalopathy  Plan; trial of clear liquids today, if unable to tolerate then cortrak placement tomorrow for tube feeds She needs repeat imaging with other pancreatic protocol CT or MRI/MRCP when able over the next 24 to 48 hours  Principal Problem:   Acute pancreatitis Active Problems:   OSA on CPAP   GERD (gastroesophageal reflux disease)   DM2 (diabetes mellitus, type 2) (HCC)   Hypertriglyceridemia without hypercholesterolemia   Essential hypertension   Chronic pancreatitis (HCC)   CAD (coronary artery disease)   Generalized abdominal pain   Bilious vomiting with nausea   Acute recurrent pancreatitis     LOS: 3 days   Amy Esterwood PA-C 08/26/2024, 10:02 AM      Attending Physician's Attestation   I have taken an interval history, reviewed the chart and examined the patient.   Patient's inflammatory markers are noted.  She continues to have no evidence of leukocytosis with slight downtrending hemoglobin suggestive of adequate fluid hydration.  She is requiring BiPAP therapy.  She did have a fever overnight.  On exam she is still having issues of abdominal pain and discomfort.  CRP downtrending though ESR is quite elevated.  Depending on her overall clinical status in the course of the next 24 to 36 hours, if she is not improving or if she has continued need for interval BiPAP, she would likely will need a core track to be placed for nutritional supplementation.  Do not see indication currently for us  to need to  repeat earlier imaging with CT or MRI at this point.  For now we will plan a 1 week follow-up CT if she remains in house or if she stagnates in regards to her overall treatment and ability to supplement her nutrition.  I agree with the Advanced Practitioner's note, impression, and recommendations with updates and my documentation as noted above.  The majority of the medical decision making/process, formulation of the impression/plan of action for the patient were performed by me with substantive portion of this encounter (>50% time spent including complete performance of at least one of the key components of MDM, History, and/or Exam).   Aloha Finner, MD [facility] Office # [phone]

## 2024-08-26 NOTE — Progress Notes (Signed)
 "  NAME:  Jamie [name], MRN:  [id number], DOB:  1970-10-22, LOS: 3 ADMISSION DATE:  08/23/2024, CONSULTATION DATE: 08/25/2024 REFERRING MD: TRH, CHIEF COMPLAINT: Encephalopathy  History of Present Illness:  Jamie [name] is a 54 year old female with a past medical history significant for HTN, HLD, OSA on CPAP, recurrent pancreatitis felt secondary to hypertriglyceridemia,type 2 diabetes, bipolar 1 disorder, anxiety presented to [facility] as an outside transfer from [facility].  Initial complaint on admission to OSH was abdominal pain with associated nausea and vomiting.  CT at outside hospital with acute pancreatitis and early signs of necrosis given concern for significant pancreatitis patient was accepted for transfer to [facility]..   On arrival to [facility] patient was seen mildly tachypneic, tachycardic, and slightly hypertensive.  Significant lab work on arrival included glucose 327, CO2 17, calcium  7.5, anion gap 22, lipase 262, triglycerides 315.  On 1/30 patient received 0.5 mg Dilaudid  in order to undergo MRCP but patient became lethargic and required application of BiPAP.  By 1/31 patient remains encephalopathic and noncompliant with therapies including therefore PCCM consulted for assistance.  Pertinent  Medical History  HTN, HLD, OSA on CPAP, recurrent pancreatitis felt secondary to hypertriglyceridemia,type 2 diabetes, bipolar 1 disorder, anxiety  Significant Hospital Events: Including procedures, antibiotic start and stop dates in addition to other pertinent events   1/30 presented from outside hospital for ongoing management at severe pancreatitis, and DKA on arrival 1/31 progressive hypoxic and hypercapnic respiratory failure requiring BiPAP dependence prompting PCCM consult and transfer to ICU  Interim History / Subjective:   Difficulty with bipap overnight Started on precedex , given diuresis, started on LABA/LAMA nebs  She was off bipap briefly this morning on  [place], requesting to go back on bipap due to trouble breathing. Hard to breath due to abdominal pain.  Objective    Blood pressure 97/64, pulse 78, temperature (!) 101.8 F (38.8 C), temperature source Oral, resp. rate 18, height 5' 3 (1.6 m), weight 109 kg, SpO2 97%.    Vent Mode: BIPAP FiO2 (%):  [40 %-60 %] 40 % Set Rate:  [16 bmp] 16 bmp PEEP:  [5 cmH20-10 cmH20] 10 cmH20   Intake/Output Summary (Last 24 hours) at 08/26/2024 9266 Last data filed at 08/26/2024 0200 Gross per 24 hour  Intake 356.17 ml  Output 1150 ml  Net -793.83 ml   Filed Weights   08/23/24 1940 08/23/24 2200  Weight: 109 kg 109 kg    Examination: General: mild distress, resting in bed, obese, placed back on bipap HEENT: [place]/AT, moist mucous membranes, sclera anicteric Neuro: awake, alert, moving all extremities CV: rrr, s1s2, no murmurs PULM: diminished air movement, expiratory wheeze - loudest in upper airway GI: soft, non-tender, non-distended, BS+ Extremities: warm, trace edema Skin: no rashes    Resolved problem list  DKA  Assessment and Plan  Acute hypoxic and hypercapnic respiratory failure History of sleep apnea - ABG repeated 1/31 with pH 7.2 pCO2 61.  Chest x-ray concerning for volume overload P: Continue BiPAP Head of bed elevated 30 degrees IV Lasix  PRN Continue budesonide /brovana  nebs  Acute pancreatitis with signs of early necrosis -CT abdomen and pelvis done at outside hospital revealed marked Peripancreatic edema and induration suggesting acute pancreatitis with subtle signs of necrosis Hypertriglyceridemia -Per outside record review patient has had multiple episodes of pancreatitis felt secondary to hypertriglyceridemia.  At baseline she is on fenofibrate , Crestor , Zen Pep P: GI following, appreciate assistance MRCP pending patient's ability to comply with exam  Trend triglyceride levels Consider potential core track placement Trend lipase Optimize pain control as able  Type 2  diabetes uncontrolled - On arrival from outside hospital patient was hyperglycemic with open anion gap and elevated beta hydroxybutyric acid as well.  She was placed on insulin  drip.  Drip transitioned off 1/31 P: Continue SSI Glargine 5 units BID CBG goal 140-180 CBG checks every 4  Hypernatremia - in setting of diuresis P: - monitor, may need to start D5W or FW flushes if coretrak is placed  Hypertension P: Oral medications on hold  As needed IV antihypertensives  Nonobstructive CAD -Previous echocardiogram August 2024 with a EF of 60-65, no WMA and normal RV function P: Follow-up echocardiogram Optimize electrolytes Continuous telemetry  Depression and anxiety Patient is on multiple antianxiety and sedating medications at baseline P: Resume home trazodone , abilify , duloxetine    Peripheral neuropathy P: Resumed gabapentin  at 300mg  BID, half of home dose  GERD  P:  Labs   CBC: Recent Labs  Lab 08/24/24 0010 08/24/24 0536 08/25/24 0553 08/26/24 0705  WBC 10.4 9.2 7.5 5.8  NEUTROABS 8.4*  --   --   --   HGB 13.5 12.8 11.2* 10.9*  HCT 41.5 39.5 34.6* 33.6*  MCV 89.2 89.8 89.2 89.1  PLT 151 160 128* 118*    Basic Metabolic Panel: Recent Labs  Lab 08/24/24 0619 08/24/24 1031 08/24/24 1438 08/24/24 1958 08/24/24 2355 08/25/24 0928 08/25/24 2145 08/26/24 0705  NA  --    < > 138 140 140 140  --  146*  K  --    < > 4.2 4.2 4.4 4.4  --  3.7  CL  --    < > 103 104 104 104  --  105  CO2  --    < > 23 25 24 23   --  28  GLUCOSE  --    < > 235* 132* 142* 193*  --  234*  BUN  --    < > 27* 26* 26* 27*  --  32*  CREATININE  --    < > 0.93 0.86 0.78 0.78  --  0.94  CALCIUM   --    < > 8.0* 8.0* 8.6* 8.2*  --  8.3*  MG 2.1  --   --   --   --  2.4  --   --   PHOS  --   --   --   --   --  1.4* 2.6  --    < > = values in this interval not displayed.   GFR: Estimated Creatinine Clearance: 82.9 mL/min (by C-G formula based on SCr of 0.94 mg/dL). Recent Labs   Lab 08/24/24 0010 08/24/24 0536 08/25/24 0553 08/25/24 0928 08/26/24 0705  PROCALCITON  --   --   --  1.44  --   WBC 10.4 9.2 7.5  --  5.8  LATICACIDVEN 1.5 1.2  --  0.7  --     Liver Function Tests: Recent Labs  Lab 08/24/24 0010 08/24/24 0537 08/25/24 0553 08/26/24 0705  AST 39 42* 101* 95*  ALT 22 21 40 60*  ALKPHOS 69 84 116 141*  BILITOT 0.8 0.7 0.5 0.5  PROT 7.1 6.7 6.5 6.5  ALBUMIN  3.6 3.2* 3.2* 3.2*   Recent Labs  Lab 08/24/24 0010 08/25/24 0928  LIPASE 262* 67*   No results for input(s): AMMONIA in the last 168 hours.  ABG    Component Value Date/Time   PHART 7.27 (L)  08/25/2024 0750   PCO2ART 61 (H) 08/25/2024 0750   PO2ART 98 08/25/2024 0750   HCO3 27.7 08/25/2024 0750   TCO2 31 12/05/2019 0723   ACIDBASEDEF 0.3 08/25/2024 0750   O2SAT 97.1 08/25/2024 0750     Coagulation Profile: No results for input(s): INR, PROTIME in the last 168 hours.  Cardiac Enzymes: No results for input(s): CKTOTAL, CKMB, CKMBINDEX, TROPONINI in the last 168 hours.  HbA1C: Hemoglobin A1C  Date/Time Value Ref Range Status  07/30/2024 02:29 PM 12.3 (A) 4.0 - 5.6 % Final  03/01/2024 03:10 PM 11.8 (A) 4.0 - 5.6 % Final   Hgb A1c MFr Bld  Date/Time Value Ref Range Status  01/26/2023 05:07 PM 10.1 (H) 4.8 - 5.6 % Final    Comment:    (NOTE) Pre diabetes:          5.7%-6.4%  Diabetes:              >6.4%  Glycemic control for   <7.0% adults with diabetes   05/19/2018 06:24 AM 6.2 (H) 4.8 - 5.6 % Final    Comment:    (NOTE) Pre diabetes:          5.7%-6.4% Diabetes:              >6.4% Glycemic control for   <7.0% adults with diabetes     CBG: Recent Labs  Lab 08/25/24 0748 08/25/24 0941 08/25/24 1455 08/25/24 2044 08/25/24 2341  GLUCAP 181* 157* 201* 212* 222*       Critical care time:   CRITICAL CARE Performed by: Dorn KATHEE Chill   Total critical care time: 35 minutes  Critical care time was exclusive of separately  billable procedures and treating other patients.  Critical care was necessary to treat or prevent imminent or life-threatening deterioration.  Critical care was time spent personally by me on the following activities: development of treatment plan with patient and/or surrogate as well as nursing, discussions with consultants, evaluation of patient's response to treatment, examination of patient, obtaining history from patient or surrogate, ordering and performing treatments and interventions, ordering and review of laboratory studies, ordering and review of radiographic studies, pulse oximetry and re-evaluation of patient's condition.  Dorn Chill, MD [facility] Office: [phone]   See Amion for personal pager PCCM on call pager [phone] until 7pm. Please call Elink 7p-7a. [phone]           "

## 2024-08-26 NOTE — Progress Notes (Signed)
Pt taken off BIPAP at this time and placed on [facility]

## 2024-08-27 DIAGNOSIS — K8591 Acute pancreatitis with uninfected necrosis, unspecified: Secondary | ICD-10-CM | POA: Diagnosis not present

## 2024-08-27 DIAGNOSIS — K859 Acute pancreatitis without necrosis or infection, unspecified: Secondary | ICD-10-CM

## 2024-08-27 DIAGNOSIS — J9602 Acute respiratory failure with hypercapnia: Secondary | ICD-10-CM | POA: Diagnosis not present

## 2024-08-27 DIAGNOSIS — J9601 Acute respiratory failure with hypoxia: Secondary | ICD-10-CM | POA: Diagnosis not present

## 2024-08-27 DIAGNOSIS — E781 Pure hyperglyceridemia: Secondary | ICD-10-CM | POA: Diagnosis not present

## 2024-08-27 LAB — CBC WITH DIFFERENTIAL/PLATELET
Abs Immature Granulocytes: 0.27 10*3/uL — ABNORMAL HIGH (ref 0.00–0.07)
Basophils Absolute: 0.1 10*3/uL (ref 0.0–0.1)
Basophils Relative: 1 %
Eosinophils Absolute: 0 10*3/uL (ref 0.0–0.5)
Eosinophils Relative: 0 %
HCT: 34.9 % — ABNORMAL LOW (ref 36.0–46.0)
Hemoglobin: 11.1 g/dL — ABNORMAL LOW (ref 12.0–15.0)
Immature Granulocytes: 6 %
Lymphocytes Relative: 13 %
Lymphs Abs: 0.6 10*3/uL — ABNORMAL LOW (ref 0.7–4.0)
MCH: 28.6 pg (ref 26.0–34.0)
MCHC: 31.8 g/dL (ref 30.0–36.0)
MCV: 89.9 fL (ref 80.0–100.0)
Monocytes Absolute: 0.4 10*3/uL (ref 0.1–1.0)
Monocytes Relative: 9 %
Neutro Abs: 3.4 10*3/uL (ref 1.7–7.7)
Neutrophils Relative %: 71 %
Platelets: 122 10*3/uL — ABNORMAL LOW (ref 150–400)
RBC: 3.88 MIL/uL (ref 3.87–5.11)
RDW: 16.6 % — ABNORMAL HIGH (ref 11.5–15.5)
Smear Review: NORMAL
WBC: 4.7 10*3/uL (ref 4.0–10.5)
nRBC: 0.6 % — ABNORMAL HIGH (ref 0.0–0.2)

## 2024-08-27 LAB — GLUCOSE, CAPILLARY
Glucose-Capillary: 134 mg/dL — ABNORMAL HIGH (ref 70–99)
Glucose-Capillary: 142 mg/dL — ABNORMAL HIGH (ref 70–99)
Glucose-Capillary: 152 mg/dL — ABNORMAL HIGH (ref 70–99)
Glucose-Capillary: 169 mg/dL — ABNORMAL HIGH (ref 70–99)
Glucose-Capillary: 180 mg/dL — ABNORMAL HIGH (ref 70–99)
Glucose-Capillary: 214 mg/dL — ABNORMAL HIGH (ref 70–99)

## 2024-08-27 LAB — BASIC METABOLIC PANEL WITH GFR
Anion gap: 14 (ref 5–15)
BUN: 31 mg/dL — ABNORMAL HIGH (ref 6–20)
CO2: 27 mmol/L (ref 22–32)
Calcium: 8.4 mg/dL — ABNORMAL LOW (ref 8.9–10.3)
Chloride: 102 mmol/L (ref 98–111)
Creatinine, Ser: 0.79 mg/dL (ref 0.44–1.00)
GFR, Estimated: >60 mL/min
Glucose, Bld: 213 mg/dL — ABNORMAL HIGH (ref 70–99)
Potassium: 3.2 mmol/L — ABNORMAL LOW (ref 3.5–5.1)
Sodium: 142 mmol/L (ref 135–145)

## 2024-08-27 MED ORDER — ZINC OXIDE 40 % EX OINT
TOPICAL_OINTMENT | Freq: Three times a day (TID) | CUTANEOUS | Status: AC | PRN
  Filled 2024-08-27: qty 57

## 2024-08-27 MED ORDER — POTASSIUM CHLORIDE 10 MEQ/100ML IV SOLN
10.0000 meq | INTRAVENOUS | Status: AC
  Administered 2024-08-27 (×6): 10 meq via INTRAVENOUS
  Filled 2024-08-27 (×6): qty 100

## 2024-08-27 MED ORDER — CLONAZEPAM 1 MG PO TABS
1.0000 mg | ORAL_TABLET | Freq: Once | ORAL | Status: AC
  Administered 2024-08-27: 1 mg via ORAL
  Filled 2024-08-27: qty 1

## 2024-08-27 NOTE — Inpatient Diabetes Management (Signed)
 Inpatient Diabetes Program Recommendations  AACE/ADA: New Consensus Statement on Inpatient Glycemic Control (2015)  Target Ranges:  Prepandial:   less than 140 mg/dL      Peak postprandial:   less than 180 mg/dL (1-2 hours)      Critically ill patients:  140 - 180 mg/dL   Lab Results  Component Value Date   GLUCAP 134 (H) 08/27/2024   HGBA1C 12.3 (A) 07/30/2024    Review of Glycemic Control  Latest Reference Range & Units 08/26/24 23:15 08/27/24 03:28 08/27/24 07:53  Glucose-Capillary 70 - 99 mg/dL 781 (H) 847 (H) 865 (H)   Diabetes history: DM 2 Outpatient Diabetes medications:  Dexcom G7 U500 90 units tid with meals  Metformin  500 mg with breakfast Current orders for Inpatient glycemic control:  Novolog  0-20 units q 4 hours Lantus  5 units bid  Inpatient Diabetes Program Recommendations:    Consider increasing Lantus  to 10 units bid. If tube feeds are started, patient will need Novolog  tube feed coverage added as well.  Patient was on U500 prior to admit indicating very high insulin  needs.    Thanks, Randall Bullocks, RN, BC-ADM Inpatient Diabetes Coordinator Pager [phone]  (8a-5p)

## 2024-08-27 NOTE — TOC Progression Note (Signed)
 Transition of Care [facility]) - Progression Note    Patient Details  Name: Jamie [name] MRN: [id number] Date of Birth: 10-20-1970  [facility] CM/SW Contact  Tom-Johnson, Elaisha Zahniser Daphne, RN Phone Number: 08/27/2024, 2:44 PM  Clinical Narrative:      Patient continues on Precedex , weaning as able, on BIPAP, GI following for Acute Pancreatitis. Off Insulin  gtt, on sliding Scale Insulin .  CM spoke with patient at bedside about needs for post hospital transition. Patient states she lives with her husband, both of her parents and her sister. Does not have children, not employed, on disability. Does not have DME's at home.  PCP is Jolee Greig DEL, PA-C and uses [facility].   No ICM needs or recommendations noted at this time.  Patient not Medically ready for discharge.  CM will continue to follow as patient progresses with care towards discharge.                Expected Discharge Plan and Services                                               Social Drivers of Health (SDOH) Interventions SDOH Screenings   Food Insecurity: No Food Insecurity (08/23/2024)  Housing: Low Risk (08/23/2024)  Transportation Needs: No Transportation Needs (08/23/2024)  Utilities: Not At Risk (08/23/2024)  Depression (PHQ2-9): High Risk (01/17/2024)  Tobacco Use: Low Risk (08/24/2024)  Health Literacy: Low Risk (09/03/2021)   Received from [facility]    Readmission Risk Interventions     No data to display

## 2024-08-27 NOTE — Progress Notes (Signed)
 "  NAME:  Jamie [name], MRN:  [id number], DOB:  [birth year], LOS: 4 ADMISSION DATE:  08/23/2024, CONSULTATION DATE: 08/25/2024 REFERRING MD: TRH, CHIEF COMPLAINT: Encephalopathy  History of Present Illness:  Jamie [name] is a 54 year old female with a past medical history significant for HTN, HLD, OSA on CPAP, recurrent pancreatitis felt secondary to hypertriglyceridemia,type 2 diabetes, bipolar 1 disorder, anxiety presented to [facility] as an outside transfer from [facility].  Initial complaint on admission to OSH was abdominal pain with associated nausea and vomiting.  CT at outside hospital with acute pancreatitis and early signs of necrosis given concern for significant pancreatitis patient was accepted for transfer to [facility]..   On arrival to [facility] patient was seen mildly tachypneic, tachycardic, and slightly hypertensive.  Significant lab work on arrival included glucose 327, CO2 17, calcium  7.5, anion gap 22, lipase 262, triglycerides 315.  On 1/30 patient received 0.5 mg Dilaudid  in order to undergo MRCP but patient became lethargic and required application of BiPAP.  By 1/31 patient remains encephalopathic and noncompliant with therapies including therefore PCCM consulted for assistance.  Pertinent  Medical History  HTN, HLD, OSA on CPAP, recurrent pancreatitis felt secondary to hypertriglyceridemia,type 2 diabetes, bipolar 1 disorder, anxiety  Significant Hospital Events: Including procedures, antibiotic start and stop dates in addition to other pertinent events   1/30 presented from outside hospital for ongoing management at severe pancreatitis, and DKA on arrival 1/31 progressive hypoxic and hypercapnic respiratory failure requiring BiPAP dependence prompting PCCM consult and transfer to ICU  Interim History / Subjective:   Some issues with keeping bipap on overnight  She reports feeling better this morning  She is tolerating clears  Objective    Blood  pressure 110/77, pulse 95, temperature 98.5 F (36.9 C), temperature source Oral, resp. rate (!) 25, height 5' 3 (1.6 m), weight 109 kg, SpO2 97%.    Vent Mode: BIPAP;PCV FiO2 (%):  [40 %] 40 % Set Rate:  [16 bmp] 16 bmp PEEP:  [10 cmH20] 10 cmH20   Intake/Output Summary (Last 24 hours) at 08/27/2024 9096 Last data filed at 08/27/2024 0800 Gross per 24 hour  Intake 1059.52 ml  Output 250 ml  Net 809.52 ml   Filed Weights   08/23/24 1940 08/23/24 2200  Weight: 109 kg 109 kg    Examination: General: resting in bed, obese  HEENT: [place]/AT, moist mucous membranes, sclera anicteric Neuro: awake, alert, moving all extremities CV: rrr, s1s2, no murmurs PULM: clear to auscultation, no wheezing GI: soft, non-tender, non-distended, BS+ Extremities: warm, trace edema Skin: no rashes    Resolved problem list  DKA  Assessment and Plan  Acute hypoxic and hypercapnic respiratory failure History of sleep apnea Continue BiPAP as needed during day and at night Head of bed elevated 30 degrees IV Lasix  PRN Continue budesonide /brovana  nebs Wean off precedex  if able  Acute pancreatitis with signs of early necrosis -CT abdomen and pelvis done at outside hospital revealed marked Peripancreatic edema and induration suggesting acute pancreatitis with subtle signs of necrosis Hypertriglyceridemia -Per outside record review patient has had multiple episodes of pancreatitis felt secondary to hypertriglyceridemia.  At baseline she is on fenofibrate , Crestor , Zen Pep P: GI following, appreciate assistance MRCP pending patient's ability to comply with exam Trend triglyceride levels Consider potential core track placement Trend lipase Optimize pain control as able  Type 2 diabetes uncontrolled - On arrival from outside hospital patient was hyperglycemic with open anion gap and elevated beta hydroxybutyric acid  as well.  She was placed on insulin  drip.  Drip transitioned off 1/31 P: Continue  SSI Glargine 5 units BID CBG goal 140-180 CBG checks every 4  Hypokalemia P: - replete  Hypertension P: Oral medications on hold  As needed IV antihypertensives  Nonobstructive CAD -Previous echocardiogram August 2024 with a EF of 60-65, no WMA and normal RV function P: Echo EF 40-45% Optimize electrolytes Continuous telemetry  Depression and anxiety Patient is on multiple antianxiety and sedating medications at baseline P: Resumed home trazodone , abilify , duloxetine  Resume home klonipin today  Peripheral neuropathy P: Resumed gabapentin  at 300mg  BID, half of home dose  GERD  P: Pantoprazole   Labs   CBC: Recent Labs  Lab 08/24/24 0010 08/24/24 0536 08/25/24 0553 08/26/24 0705 08/27/24 0015  WBC 10.4 9.2 7.5 5.8 4.7  NEUTROABS 8.4*  --   --   --  3.4  HGB 13.5 12.8 11.2* 10.9* 11.1*  HCT 41.5 39.5 34.6* 33.6* 34.9*  MCV 89.2 89.8 89.2 89.1 89.9  PLT 151 160 128* 118* 122*    Basic Metabolic Panel: Recent Labs  Lab 08/24/24 0619 08/24/24 1031 08/24/24 1958 08/24/24 2355 08/25/24 0928 08/25/24 2145 08/26/24 0705 08/27/24 0015  NA  --    < > 140 140 140  --  146* 142  K  --    < > 4.2 4.4 4.4  --  3.7 3.2*  CL  --    < > 104 104 104  --  105 102  CO2  --    < > 25 24 23   --  28 27  GLUCOSE  --    < > 132* 142* 193*  --  234* 213*  BUN  --    < > 26* 26* 27*  --  32* 31*  CREATININE  --    < > 0.86 0.78 0.78  --  0.94 0.79  CALCIUM   --    < > 8.0* 8.6* 8.2*  --  8.3* 8.4*  MG 2.1  --   --   --  2.4  --   --   --   PHOS  --   --   --   --  1.4* 2.6  --   --    < > = values in this interval not displayed.   GFR: Estimated Creatinine Clearance: 97.4 mL/min (by C-G formula based on SCr of 0.79 mg/dL). Recent Labs  Lab 08/24/24 0010 08/24/24 0536 08/25/24 0553 08/25/24 0928 08/26/24 0705 08/27/24 0015  PROCALCITON  --   --   --  1.44  --   --   WBC 10.4 9.2 7.5  --  5.8 4.7  LATICACIDVEN 1.5 1.2  --  0.7  --   --     Liver Function  Tests: Recent Labs  Lab 08/24/24 0010 08/24/24 0537 08/25/24 0553 08/26/24 0705  AST 39 42* 101* 95*  ALT 22 21 40 60*  ALKPHOS 69 84 116 141*  BILITOT 0.8 0.7 0.5 0.5  PROT 7.1 6.7 6.5 6.5  ALBUMIN  3.6 3.2* 3.2* 3.2*   Recent Labs  Lab 08/24/24 0010 08/25/24 0928  LIPASE 262* 67*   No results for input(s): AMMONIA in the last 168 hours.  ABG    Component Value Date/Time   PHART 7.27 (L) 08/25/2024 0750   PCO2ART 61 (H) 08/25/2024 0750   PO2ART 98 08/25/2024 0750   HCO3 27.7 08/25/2024 0750   TCO2 31 12/05/2019 0723   ACIDBASEDEF 0.3 08/25/2024 0750  O2SAT 97.1 08/25/2024 0750     Coagulation Profile: No results for input(s): INR, PROTIME in the last 168 hours.  Cardiac Enzymes: No results for input(s): CKTOTAL, CKMB, CKMBINDEX, TROPONINI in the last 168 hours.  HbA1C: Hemoglobin A1C  Date/Time Value Ref Range Status  07/30/2024 02:29 PM 12.3 (A) 4.0 - 5.6 % Final  03/01/2024 03:10 PM 11.8 (A) 4.0 - 5.6 % Final   Hgb A1c MFr Bld  Date/Time Value Ref Range Status  01/26/2023 05:07 PM 10.1 (H) 4.8 - 5.6 % Final    Comment:    (NOTE) Pre diabetes:          5.7%-6.4%  Diabetes:              >6.4%  Glycemic control for   <7.0% adults with diabetes   05/19/2018 06:24 AM 6.2 (H) 4.8 - 5.6 % Final    Comment:    (NOTE) Pre diabetes:          5.7%-6.4% Diabetes:              >6.4% Glycemic control for   <7.0% adults with diabetes     CBG: Recent Labs  Lab 08/26/24 1538 08/26/24 2018 08/26/24 2315 08/27/24 0328 08/27/24 0753  GLUCAP 214* 263* 218* 152* 134*       Critical care time:   CRITICAL CARE Performed by: Dorn KATHEE Chill   Total critical care time: 35 minutes  Critical care time was exclusive of separately billable procedures and treating other patients.  Critical care was necessary to treat or prevent imminent or life-threatening deterioration.  Critical care was time spent personally by me on the following  activities: development of treatment plan with patient and/or surrogate as well as nursing, discussions with consultants, evaluation of patient's response to treatment, examination of patient, obtaining history from patient or surrogate, ordering and performing treatments and interventions, ordering and review of laboratory studies, ordering and review of radiographic studies, pulse oximetry and re-evaluation of patient's condition.  Dorn Chill, MD [facility] Office: [phone]   See Amion for personal pager PCCM on call pager [phone] until 7pm. Please call Elink 7p-7a. [phone]           "

## 2024-08-27 NOTE — Progress Notes (Addendum)
"      ° °  Patient Name: Jamie [name] Date of Encounter: 08/27/2024, 12:24 PM     Assessment and Plan  Acute pancreatitis, thought due to hypertriglyceridemia DKA Respiratory  --------------------------------------------------------------------- She is improving Will advance diet to full liquids We will sign off and return prn Would f/u outpatient GI in [place]     Subjective  Says less abdominal pain Taking clear liquids   Objective  BP 108/75   Pulse 93   Temp 98.7 F (37.1 C) (Axillary)   Resp (!) 22   Ht 5' 3 (1.6 m)   Wt 109 kg   LMP  (LMP Unknown)   SpO2 100%   BMI 42.57 kg/m  Obese on BiPap mask Abd obese soft and nontender  Recent Labs  Lab 08/24/24 0010 08/24/24 0537 08/25/24 0553 08/26/24 0705  AST 39 42* 101* 95*  ALT 22 21 40 60*  ALKPHOS 69 84 116 141*  BILITOT 0.8 0.7 0.5 0.5  PROT 7.1 6.7 6.5 6.5  ALBUMIN  3.6 3.2* 3.2* 3.2*   Recent Labs  Lab 08/25/24 0553 08/26/24 0705 08/27/24 0015  HGB 11.2* 10.9* 11.1*  HCT 34.6* 33.6* 34.9*  WBC 7.5 5.8 4.7  PLT 128* 118* 122*   Recent Labs  Lab 08/24/24 0619 08/24/24 1031 08/24/24 1958 08/24/24 2355 08/25/24 0928 08/25/24 2145 08/26/24 0705 08/27/24 0015  NA  --    < > 140 140 140  --  146* 142  K  --    < > 4.2 4.4 4.4  --  3.7 3.2*  CL  --    < > 104 104 104  --  105 102  CO2  --    < > 25 24 23   --  28 27  GLUCOSE  --    < > 132* 142* 193*  --  234* 213*  BUN  --    < > 26* 26* 27*  --  32* 31*  CREATININE  --    < > 0.86 0.78 0.78  --  0.94 0.79  CALCIUM   --    < > 8.0* 8.6* 8.2*  --  8.3* 8.4*  MG 2.1  --   --   --  2.4  --   --   --   PHOS  --   --   --   --  1.4* 2.6  --   --    < > = values in this interval not displayed.    Lab Results  Component Value Date   LIPASE 85 (H) 08/25/2024       Lupita CHARLENA Commander, MD, [facility]enter[facility] See TRACEY on call - gastroenterology for best contact person 08/27/2024 12:24 PM   "

## 2024-08-27 NOTE — Progress Notes (Signed)
 eLink Physician-Brief Progress Note Patient Name: Jamie [name] DOB: [birth year] MRN: [id number]   Date of Service  08/27/2024  HPI/Events of Note  Notified of moisture-associated redness in the groin and some vaginal redness.   eICU Interventions  Apply desitin cream.      Intervention Category Minor Interventions: Other:  Lauralye Kinn 08/27/2024, 2:39 AM

## 2024-08-27 NOTE — Progress Notes (Signed)
 [facility] ADULT ICU REPLACEMENT PROTOCOL   The patient does apply for the [name] Adult ICU Electrolyte Replacment Protocol based on the criteria listed below:   1.Exclusion criteria: TCTS, ECMO, Dialysis, and Myasthenia Gravis patients 2. Is GFR >/= 30 ml/min? Yes.    Patient's GFR today is >60 3. Is SCr </= 2? Yes.   Patient's SCr is 0.79 mg/dL 4. Did SCr increase >/= 0.5 in 24 hours? No. 5.Pt's weight >40kg  Yes.   6. Abnormal electrolyte(s): K  7. Electrolytes replaced per protocol 8.  Call MD STAT for K+ </= 2.5, Phos </= 1, or Mag </= 1 Physician:  Kassie Hunter BRAVO [facility] [employer] 08/27/2024 2:19 AM

## 2024-08-27 NOTE — Plan of Care (Signed)
  Problem: Education: Goal: Knowledge of General Education information will improve Description: Including pain rating scale, medication(s)/side effects and non-pharmacologic comfort measures Outcome: Progressing   Problem: Health Behavior/Discharge Planning: Goal: Ability to manage health-related needs will improve Outcome: Progressing   Problem: Clinical Measurements: Goal: Ability to maintain clinical measurements within normal limits will improve Outcome: Progressing Goal: Will remain free from infection Outcome: Progressing   Problem: Activity: Goal: Risk for activity intolerance will decrease Outcome: Progressing   Problem: Nutrition: Goal: Adequate nutrition will be maintained Outcome: Progressing   Problem: Coping: Goal: Level of anxiety will decrease Outcome: Progressing   Problem: Elimination: Goal: Will not experience complications related to bowel motility Outcome: Progressing Goal: Will not experience complications related to urinary retention Outcome: Progressing   Problem: Pain Managment: Goal: General experience of comfort will improve and/or be controlled Outcome: Progressing   Problem: Safety: Goal: Ability to remain free from injury will improve Outcome: Progressing

## 2024-08-27 NOTE — Progress Notes (Signed)
" °   08/27/24 2255  BiPAP/CPAP/SIPAP  BiPAP/CPAP/SIPAP Pt Type Adult  BiPAP/CPAP/SIPAP SERVO  Mask Type Full face mask  Mask Size Medium  Set Rate 12 breaths/min  Respiratory Rate 20 breaths/min  IPAP 18 cmH20  EPAP 10 cmH2O  PEEP 10 cmH20  FiO2 (%) 40 %  Minute Ventilation 12  Leak 70  Peak Inspiratory Pressure (PIP) 19  Tidal Volume (Vt) 690  Patient Home Machine No  Patient Home Mask No  Patient Home Tubing No  Auto Titrate No  Press High Alarm 30 cmH2O  Press Low Alarm 5 cmH2O    "

## 2024-08-28 ENCOUNTER — Inpatient Hospital Stay (HOSPITAL_COMMUNITY)

## 2024-08-28 DIAGNOSIS — J9602 Acute respiratory failure with hypercapnia: Secondary | ICD-10-CM | POA: Diagnosis not present

## 2024-08-28 DIAGNOSIS — E1165 Type 2 diabetes mellitus with hyperglycemia: Secondary | ICD-10-CM | POA: Diagnosis not present

## 2024-08-28 DIAGNOSIS — E8770 Fluid overload, unspecified: Secondary | ICD-10-CM | POA: Diagnosis not present

## 2024-08-28 DIAGNOSIS — I10 Essential (primary) hypertension: Secondary | ICD-10-CM | POA: Diagnosis not present

## 2024-08-28 DIAGNOSIS — J9601 Acute respiratory failure with hypoxia: Secondary | ICD-10-CM

## 2024-08-28 DIAGNOSIS — R609 Edema, unspecified: Secondary | ICD-10-CM | POA: Diagnosis not present

## 2024-08-28 DIAGNOSIS — K219 Gastro-esophageal reflux disease without esophagitis: Secondary | ICD-10-CM | POA: Diagnosis not present

## 2024-08-28 DIAGNOSIS — K8591 Acute pancreatitis with uninfected necrosis, unspecified: Secondary | ICD-10-CM | POA: Diagnosis not present

## 2024-08-28 DIAGNOSIS — E781 Pure hyperglyceridemia: Secondary | ICD-10-CM | POA: Diagnosis not present

## 2024-08-28 DIAGNOSIS — F418 Other specified anxiety disorders: Secondary | ICD-10-CM | POA: Diagnosis not present

## 2024-08-28 DIAGNOSIS — Z794 Long term (current) use of insulin: Secondary | ICD-10-CM | POA: Diagnosis not present

## 2024-08-28 DIAGNOSIS — E1142 Type 2 diabetes mellitus with diabetic polyneuropathy: Secondary | ICD-10-CM | POA: Diagnosis not present

## 2024-08-28 DIAGNOSIS — I2510 Atherosclerotic heart disease of native coronary artery without angina pectoris: Secondary | ICD-10-CM | POA: Diagnosis not present

## 2024-08-28 LAB — GLUCOSE, CAPILLARY
Glucose-Capillary: 139 mg/dL — ABNORMAL HIGH (ref 70–99)
Glucose-Capillary: 161 mg/dL — ABNORMAL HIGH (ref 70–99)
Glucose-Capillary: 183 mg/dL — ABNORMAL HIGH (ref 70–99)
Glucose-Capillary: 187 mg/dL — ABNORMAL HIGH (ref 70–99)
Glucose-Capillary: 197 mg/dL — ABNORMAL HIGH (ref 70–99)
Glucose-Capillary: 211 mg/dL — ABNORMAL HIGH (ref 70–99)

## 2024-08-28 LAB — CBC WITH DIFFERENTIAL/PLATELET
Abs Immature Granulocytes: 0.39 10*3/uL — ABNORMAL HIGH (ref 0.00–0.07)
Basophils Absolute: 0 10*3/uL (ref 0.0–0.1)
Basophils Relative: 0 %
Eosinophils Absolute: 0 10*3/uL (ref 0.0–0.5)
Eosinophils Relative: 0 %
HCT: 37.4 % (ref 36.0–46.0)
Hemoglobin: 12.2 g/dL (ref 12.0–15.0)
Immature Granulocytes: 5 %
Lymphocytes Relative: 6 %
Lymphs Abs: 0.5 10*3/uL — ABNORMAL LOW (ref 0.7–4.0)
MCH: 28.9 pg (ref 26.0–34.0)
MCHC: 32.6 g/dL (ref 30.0–36.0)
MCV: 88.6 fL (ref 80.0–100.0)
Monocytes Absolute: 0.4 10*3/uL (ref 0.1–1.0)
Monocytes Relative: 5 %
Neutro Abs: 6.8 10*3/uL (ref 1.7–7.7)
Neutrophils Relative %: 84 %
Platelets: 125 10*3/uL — ABNORMAL LOW (ref 150–400)
RBC: 4.22 MIL/uL (ref 3.87–5.11)
RDW: 16.3 % — ABNORMAL HIGH (ref 11.5–15.5)
Smear Review: NORMAL
WBC: 8.2 10*3/uL (ref 4.0–10.5)
nRBC: 0.4 % — ABNORMAL HIGH (ref 0.0–0.2)

## 2024-08-28 LAB — BASIC METABOLIC PANEL WITH GFR
Anion gap: 14 (ref 5–15)
BUN: 19 mg/dL (ref 6–20)
CO2: 26 mmol/L (ref 22–32)
Calcium: 8.3 mg/dL — ABNORMAL LOW (ref 8.9–10.3)
Chloride: 99 mmol/L (ref 98–111)
Creatinine, Ser: 0.63 mg/dL (ref 0.44–1.00)
GFR, Estimated: >60 mL/min
Glucose, Bld: 170 mg/dL — ABNORMAL HIGH (ref 70–99)
Potassium: 3.5 mmol/L (ref 3.5–5.1)
Sodium: 138 mmol/L (ref 135–145)

## 2024-08-28 MED ORDER — CLONAZEPAM 1 MG PO TABS
1.0000 mg | ORAL_TABLET | Freq: Every morning | ORAL | Status: AC
  Administered 2024-08-28 – 2024-08-31 (×4): 1 mg via ORAL
  Filled 2024-08-28 (×4): qty 1

## 2024-08-28 MED ORDER — CLONAZEPAM 1 MG PO TABS
2.0000 mg | ORAL_TABLET | Freq: Every day | ORAL | Status: AC
  Administered 2024-08-28 – 2024-08-31 (×4): 2 mg via ORAL
  Filled 2024-08-28 (×4): qty 2

## 2024-08-28 MED ORDER — POTASSIUM CHLORIDE CRYS ER 20 MEQ PO TBCR
40.0000 meq | EXTENDED_RELEASE_TABLET | Freq: Once | ORAL | Status: AC
  Administered 2024-08-28: 40 meq via ORAL
  Filled 2024-08-28: qty 2

## 2024-08-28 NOTE — Progress Notes (Signed)
 [facility] ADULT ICU REPLACEMENT PROTOCOL   The patient does apply for the [facility] Adult ICU Electrolyte Replacment Protocol based on the criteria listed below:   1.Exclusion criteria: TCTS, ECMO, Dialysis, and Myasthenia Gravis patients 2. Is GFR >/= 30 ml/min? Yes.    Patient's GFR today is >60 3. Is SCr </= 2? Yes.   Patient's SCr is 0.63 mg/dL 4. Did SCr increase >/= 0.5 in 24 hours? No. 5.Pt's weight >40kg  Yes.   6. Abnormal electrolyte(s): potassium 3.5  7. Electrolytes replaced per protocol 8.  Call MD STAT for K+ </= 2.5, Phos </= 1, or Mag </= 1 Physician:  protocol  Claretta JINNY Sharps 08/28/2024 4:14 AM

## 2024-08-28 NOTE — Progress Notes (Signed)
" °   08/28/24 2323  BiPAP/CPAP/SIPAP  BiPAP/CPAP/SIPAP Pt Type Adult  BiPAP/CPAP/SIPAP SERVO  Mask Type Full face mask  Mask Size Medium  Set Rate 12 breaths/min  Respiratory Rate 18 breaths/min  IPAP 18 cmH20  EPAP 8 cmH2O  FiO2 (%) 40 %  Minute Ventilation 13.7  Leak 51  Peak Inspiratory Pressure (PIP) 18  Tidal Volume (Vt) 690  Patient Home Machine No  Patient Home Mask No  Patient Home Tubing No  Auto Titrate No  Press High Alarm 30 cmH2O  Press Low Alarm 5 cmH2O  Device Plugged into RED Power Outlet Yes  BiPAP/CPAP /SiPAP Vitals  Pulse Rate (!) 103  Resp 19  SpO2 100 %  Bilateral Breath Sounds Clear;Diminished  MEWS Score/Color  MEWS Score 1  MEWS Score Color Green    "

## 2024-08-28 NOTE — Hospital Course (Addendum)
 54 year old female with PMH of HTN, HLD, OSA, recurrent pancreatitis, T2DM, bipolar 1, and anxiety, transferred from [facility] with abdominal pain, nausea, vomiting, and acute pancreatitis with early necrosis in the setting of high triglycerides., required BiPap, got encephalopathic and prompted PCCM admission    Interval Events: Remained on Dex 0.6. Otherwise no acute events   Vitals: Afebrile, BP of 101/66, on dex 0.6, sating > 90% on 5L on [place]    I/O: in 0.6, no output .    Drips: Dex   Lines/Tubes: Peripheral IVs in right arm     Physical Examination: alert, awake, follows commands, normal heart sounds, lungs are clear to auscultation, no wheezing, she has a pannus abdomen but no guarding or rebound tenderness  Pertinent Labs: Na 138,, K 3.5, bicarb 2.6, Cr 0.63, ,WBC 8.2 , Hgb  stable at 12.2, PLT 125.  Glucose of 139.   Micro: No new cultures    Imaging: No new imaging    Acute hypoxic and hypercapnic respiratory failure History of sleep apnea - Newly reduced EF may be contributing to this  verse pulmonary edema and decrease compliance  - On 5L [place] sating at 98% - On 0.6 of Dex.  Will wean as tolerated - Continue BiPAP as needed during day and at night - Aspiration precautions in place  - Head of bed elevated 30 degrees  - May try Lasix  20 mg  today  - Continue budesonide /brovana  nebs  Acute pancreatitis with signs of early necrosis 2/2 Hypertriglyceridemia - CT abdomen and pelvis with marked Peripancreatic edema and induration suggesting acute pancreatitis with subtle signs of necrosis -  At baseline she is on fenofibrate , Crestor , Zen Pep -  GI following, appreciate assistance - May need MRCP pending patient's ability to comply with exam - Optimize pain control    Type 2 diabetes uncontrolled - DKA resolved and diet resumed - Will continue continue SSI with Glargine 5 units BID - CGM   Hypokalemia-resolved Will continue to monitor   Hypertension - BP  101/66 - Holding home anti-hypertensive   Nonobstructive CAD ECHO  August 2024 with a EF of 60-65, no WMA and normal RV function. Repeat 08/2024 showed Echo EF 40-45%. On no GDMT at this time. This could be contributing to her SOB. May need Cards follow up    Depression and anxiety - Continuing home trazodone , Abilify  and duloxetine  and Klonopin  - Will continue to monitor  Peripheral neuropathy - Continue  gabapentin  at 300mg  BID, half of home dose   GERD  - Continuing Pantoprazol

## 2024-08-29 ENCOUNTER — Other Ambulatory Visit: Payer: Self-pay | Admitting: Internal Medicine

## 2024-08-29 DIAGNOSIS — J9601 Acute respiratory failure with hypoxia: Secondary | ICD-10-CM | POA: Diagnosis not present

## 2024-08-29 DIAGNOSIS — I10 Essential (primary) hypertension: Secondary | ICD-10-CM

## 2024-08-29 DIAGNOSIS — K859 Acute pancreatitis without necrosis or infection, unspecified: Secondary | ICD-10-CM | POA: Diagnosis not present

## 2024-08-29 LAB — CBC WITH DIFFERENTIAL/PLATELET
Abs Immature Granulocytes: 0.25 10*3/uL — ABNORMAL HIGH (ref 0.00–0.07)
Basophils Absolute: 0 10*3/uL (ref 0.0–0.1)
Basophils Relative: 0 %
Eosinophils Absolute: 0 10*3/uL (ref 0.0–0.5)
Eosinophils Relative: 0 %
HCT: 39.2 % (ref 36.0–46.0)
Hemoglobin: 12.5 g/dL (ref 12.0–15.0)
Immature Granulocytes: 3 %
Lymphocytes Relative: 8 %
Lymphs Abs: 0.7 10*3/uL (ref 0.7–4.0)
MCH: 28.7 pg (ref 26.0–34.0)
MCHC: 31.9 g/dL (ref 30.0–36.0)
MCV: 89.9 fL (ref 80.0–100.0)
Monocytes Absolute: 0.4 10*3/uL (ref 0.1–1.0)
Monocytes Relative: 5 %
Neutro Abs: 7.3 10*3/uL (ref 1.7–7.7)
Neutrophils Relative %: 84 %
Platelets: 114 10*3/uL — ABNORMAL LOW (ref 150–400)
RBC: 4.36 MIL/uL (ref 3.87–5.11)
RDW: 16.3 % — ABNORMAL HIGH (ref 11.5–15.5)
Smear Review: NORMAL
WBC: 8.7 10*3/uL (ref 4.0–10.5)
nRBC: 0 % (ref 0.0–0.2)

## 2024-08-29 LAB — COMPREHENSIVE METABOLIC PANEL WITH GFR
ALT: 38 U/L (ref 0–44)
AST: 26 U/L (ref 15–41)
Albumin: 2.8 g/dL — ABNORMAL LOW (ref 3.5–5.0)
Alkaline Phosphatase: 176 U/L — ABNORMAL HIGH (ref 38–126)
Anion gap: 10 (ref 5–15)
BUN: 19 mg/dL (ref 6–20)
CO2: 32 mmol/L (ref 22–32)
Calcium: 8.8 mg/dL — ABNORMAL LOW (ref 8.9–10.3)
Chloride: 95 mmol/L — ABNORMAL LOW (ref 98–111)
Creatinine, Ser: 0.59 mg/dL (ref 0.44–1.00)
GFR, Estimated: >60 mL/min
Glucose, Bld: 229 mg/dL — ABNORMAL HIGH (ref 70–99)
Potassium: 3.5 mmol/L (ref 3.5–5.1)
Sodium: 137 mmol/L (ref 135–145)
Total Bilirubin: 0.5 mg/dL (ref 0.0–1.2)
Total Protein: 6.2 g/dL — ABNORMAL LOW (ref 6.5–8.1)

## 2024-08-29 LAB — GLUCOSE, CAPILLARY
Glucose-Capillary: 160 mg/dL — ABNORMAL HIGH (ref 70–99)
Glucose-Capillary: 192 mg/dL — ABNORMAL HIGH (ref 70–99)
Glucose-Capillary: 214 mg/dL — ABNORMAL HIGH (ref 70–99)
Glucose-Capillary: 217 mg/dL — ABNORMAL HIGH (ref 70–99)
Glucose-Capillary: 222 mg/dL — ABNORMAL HIGH (ref 70–99)
Glucose-Capillary: 234 mg/dL — ABNORMAL HIGH (ref 70–99)

## 2024-08-29 MED ORDER — INSULIN GLARGINE-YFGN 100 UNIT/ML ~~LOC~~ SOLN
10.0000 [IU] | Freq: Two times a day (BID) | SUBCUTANEOUS | Status: DC
  Administered 2024-08-29 – 2024-08-30 (×3): 10 [IU] via SUBCUTANEOUS
  Filled 2024-08-29 (×5): qty 0.1

## 2024-08-29 MED ORDER — ONDANSETRON HCL 4 MG PO TABS
8.0000 mg | ORAL_TABLET | Freq: Once | ORAL | Status: AC
  Administered 2024-08-29: 8 mg via ORAL
  Filled 2024-08-29: qty 2

## 2024-08-29 NOTE — Progress Notes (Signed)
" °   08/29/24 1816  Hand-off documentation  Hand-off Given Given to shift RN/LPN    Report called to [employer]. Patient transported with all personal belongings.  "

## 2024-08-29 NOTE — Progress Notes (Signed)
" °   08/29/24 2032  BiPAP/CPAP/SIPAP  BiPAP/CPAP/SIPAP Pt Type Adult  Reason BIPAP/CPAP not in use  (pt refused bipap, and stated she will request it if needed.)  BiPAP/CPAP /SiPAP Vitals  Pulse Rate (!) 103  SpO2 99 %  Bilateral Breath Sounds Clear;Diminished    "

## 2024-08-29 NOTE — Progress Notes (Signed)
 "  NAME:  Jamie [name], MRN:  [id number], DOB:  Dec 16, 1970, LOS: 6 ADMISSION DATE:  08/23/2024, CONSULTATION DATE: 08/25/2024 REFERRING MD: TRH, CHIEF COMPLAINT: Encephalopathy  History of Present Illness:  Jamie [name] is a 54 year old female with a past medical history significant for HTN, HLD, OSA on CPAP, recurrent pancreatitis felt secondary to hypertriglyceridemia,type 2 diabetes, bipolar 1 disorder, anxiety presented to [facility] as an outside transfer from [facility].  Initial complaint on admission to OSH was abdominal pain with associated nausea and vomiting.  CT at outside hospital with acute pancreatitis and early signs of necrosis given concern for significant pancreatitis patient was accepted for transfer to [facility]..   On arrival to [facility] patient was seen mildly tachypneic, tachycardic, and slightly hypertensive.  Significant lab work on arrival included glucose 327, CO2 17, calcium  7.5, anion gap 22, lipase 262, triglycerides 315.  On 1/30 patient received 0.5 mg Dilaudid  in order to undergo MRCP but patient became lethargic and required application of BiPAP.  By 1/31 patient remains encephalopathic and noncompliant with therapies including therefore PCCM consulted for assistance.  Pertinent  Medical History  HTN, HLD, OSA on CPAP, recurrent pancreatitis felt secondary to hypertriglyceridemia,type 2 diabetes, bipolar 1 disorder, anxiety  Significant Hospital Events: Including procedures, antibiotic start and stop dates in addition to other pertinent events   1/30 presented from outside hospital for ongoing management at severe pancreatitis, and DKA on arrival 1/31 progressive hypoxic and hypercapnic respiratory failure requiring BiPAP dependence prompting PCCM consult and transfer to ICU  Interim History / Subjective:  Patient remain off Dex and intermittently required BiPAP overnight.  Otherwise no acute events overnight.  Remained on 5 L nasal  cannula  Objective    Blood pressure 135/81, pulse (!) 105, temperature 98.6 F (37 C), temperature source Oral, resp. rate 20, height 5' 3 (1.6 m), weight 109 kg, SpO2 98%.    FiO2 (%):  [40 %] 40 % PEEP:  [8 cmH20] 8 cmH20   Intake/Output Summary (Last 24 hours) at 08/29/2024 0944 Last data filed at 08/28/2024 1800 Gross per 24 hour  Intake 300 ml  Output 500 ml  Net -200 ml   Filed Weights   08/23/24 1940 08/23/24 2200  Weight: 109 kg 109 kg    Examination: General: resting in bed, obese , responding to questions appropriately  HEENT: [place]/AT, moist mucous membranes, sclera anicteric Neuro: awake, alert, moving all extremities CV: rrr, s1s2, no murmurs PULM: clear to auscultation, no wheezing GI: soft, non-tender, non-distended, BS+ Extremities: warm, trace edema Skin: no rashes   Resolved problem list  DKA  Assessment and Plan  Acute hypoxic and hypercapnic respiratory failure History of sleep apnea - Newly reduced EF may be contributing to this  verse pulmonary edema and decrease compliance and her anxiety  - Continues to require 5L [place]  - Off Precedex  - Will continue continue BiPAP as needed during day and at night - Aspiration precautions in place  - Head of bed elevated 30 degrees  - May try Lasix  20 mg  today  - Continue budesonide /brovana  nebs  Acute pancreatitis with signs of early necrosis 2/2 Hypertriglyceridemia - CT abdomen and pelvis with marked Peripancreatic edema and induration suggesting acute pancreatitis with subtle signs of necrosis -  At baseline she is on fenofibrate , Crestor , Zen Pep - I wonder if MRCP, possible during this hospitalization due to concomitant anxiety when patient lays flat - GI is following, appreciate their recs  Type 2 diabetes  uncontrolled - DKA resolved and diet resumed - Blood glucose of 160 - Continue SSI with Glargine 10 units BID - CGM   Hypokalemia-resolved Will continue to monitor   Hypertension - BP  131/80 - Continue to hold holding anti-hypertensive  - Low threshold to be hypotensive again - Consider resuming if BP holds for the next 24hrs    Nonobstructive CAD ECHO  August 2024 with a EF of 60-65, no WMA and normal RV function. Repeat 08/2024 showed Echo EF 40-45%. On no GDMT at this time. This could be contributing to her SOB. May need Cards follow up    Depression and anxiety - Continuing home trazodone , Abilify  and duloxetine  and Klonopin . - Will continue to monitor  Peripheral neuropathy - Continue  gabapentin  at 300mg  BID, half of home dose   GERD  - Continuing Pantoprazole   DISPO: Family agrees with LTAC . Will transfer to progressive given Stability over the past 24hrs  Labs   CBC: Recent Labs  Lab 08/24/24 0010 08/24/24 0536 08/25/24 0553 08/26/24 0705 08/27/24 0015 08/28/24 0206  WBC 10.4 9.2 7.5 5.8 4.7 8.2  NEUTROABS 8.4*  --   --   --  3.4 6.8  HGB 13.5 12.8 11.2* 10.9* 11.1* 12.2  HCT 41.5 39.5 34.6* 33.6* 34.9* 37.4  MCV 89.2 89.8 89.2 89.1 89.9 88.6  PLT 151 160 128* 118* 122* 125*    Basic Metabolic Panel: Recent Labs  Lab 08/24/24 0619 08/24/24 1031 08/24/24 2355 08/25/24 0928 08/25/24 2145 08/26/24 0705 08/27/24 0015 08/28/24 0206  NA  --    < > 140 140  --  146* 142 138  K  --    < > 4.4 4.4  --  3.7 3.2* 3.5  CL  --    < > 104 104  --  105 102 99  CO2  --    < > 24 23  --  28 27 26   GLUCOSE  --    < > 142* 193*  --  234* 213* 170*  BUN  --    < > 26* 27*  --  32* 31* 19  CREATININE  --    < > 0.78 0.78  --  0.94 0.79 0.63  CALCIUM   --    < > 8.6* 8.2*  --  8.3* 8.4* 8.3*  MG 2.1  --   --  2.4  --   --   --   --   PHOS  --   --   --  1.4* 2.6  --   --   --    < > = values in this interval not displayed.   GFR: Estimated Creatinine Clearance: 97.4 mL/min (by C-G formula based on SCr of 0.63 mg/dL). Recent Labs  Lab 08/24/24 0010 08/24/24 0536 08/25/24 0553 08/25/24 0928 08/26/24 0705 08/27/24 0015 08/28/24 0206   PROCALCITON  --   --   --  1.44  --   --   --   WBC 10.4 9.2 7.5  --  5.8 4.7 8.2  LATICACIDVEN 1.5 1.2  --  0.7  --   --   --     Liver Function Tests: Recent Labs  Lab 08/24/24 0010 08/24/24 0537 08/25/24 0553 08/26/24 0705  AST 39 42* 101* 95*  ALT 22 21 40 60*  ALKPHOS 69 84 116 141*  BILITOT 0.8 0.7 0.5 0.5  PROT 7.1 6.7 6.5 6.5  ALBUMIN  3.6 3.2* 3.2* 3.2*   Recent Labs  Lab 08/24/24  0010 08/25/24 0928  LIPASE 262* 67*   No results for input(s): AMMONIA in the last 168 hours.  ABG    Component Value Date/Time   PHART 7.27 (L) 08/25/2024 0750   PCO2ART 61 (H) 08/25/2024 0750   PO2ART 98 08/25/2024 0750   HCO3 27.7 08/25/2024 0750   TCO2 31 12/05/2019 0723   ACIDBASEDEF 0.3 08/25/2024 0750   O2SAT 97.1 08/25/2024 0750     Coagulation Profile: No results for input(s): INR, PROTIME in the last 168 hours.  Cardiac Enzymes: No results for input(s): CKTOTAL, CKMB, CKMBINDEX, TROPONINI in the last 168 hours.  HbA1C: Hemoglobin A1C  Date/Time Value Ref Range Status  07/30/2024 02:29 PM 12.3 (A) 4.0 - 5.6 % Final  03/01/2024 03:10 PM 11.8 (A) 4.0 - 5.6 % Final   Hgb A1c MFr Bld  Date/Time Value Ref Range Status  01/26/2023 05:07 PM 10.1 (H) 4.8 - 5.6 % Final    Comment:    (NOTE) Pre diabetes:          5.7%-6.4%  Diabetes:              >6.4%  Glycemic control for   <7.0% adults with diabetes   05/19/2018 06:24 AM 6.2 (H) 4.8 - 5.6 % Final    Comment:    (NOTE) Pre diabetes:          5.7%-6.4% Diabetes:              >6.4% Glycemic control for   <7.0% adults with diabetes     CBG: Recent Labs  Lab 08/28/24 1540 08/28/24 1946 08/28/24 2332 08/29/24 0324 08/29/24 0738  GLUCAP 187* 183* 197* 192* 160*       Critical care time:   CRITICAL CARE Performed by: Drue Grow   Total critical care time: 35 minutes  Critical care time was exclusive of separately billable procedures and treating other patients.  Critical  care was necessary to treat or prevent imminent or life-threatening deterioration.  Critical care was time spent personally by me on the following activities: development of treatment plan with patient and/or surrogate as well as nursing, discussions with consultants, evaluation of patient's response to treatment, examination of patient, obtaining history from patient or surrogate, ordering and performing treatments and interventions, ordering and review of laboratory studies, ordering and review of radiographic studies, pulse oximetry and re-evaluation of patient's condition.  Drue Lisa Grow MD Internal Medicine Resident: [id number] Please contact the on call pager at: [phone] 08/29/2024, 9:44 AM    "

## 2024-08-29 NOTE — Progress Notes (Signed)
 No urine output since 1900 on 2/3. Bladder scan shows 70ml. Provider notified in person.

## 2024-08-29 NOTE — Plan of Care (Signed)

## 2024-08-30 ENCOUNTER — Encounter (HOSPITAL_COMMUNITY): Payer: Self-pay | Admitting: Internal Medicine

## 2024-08-30 ENCOUNTER — Inpatient Hospital Stay (HOSPITAL_COMMUNITY)

## 2024-08-30 DIAGNOSIS — I5021 Acute systolic (congestive) heart failure: Secondary | ICD-10-CM

## 2024-08-30 LAB — COMPREHENSIVE METABOLIC PANEL WITH GFR
ALT: 30 U/L (ref 0–44)
AST: 25 U/L (ref 15–41)
Albumin: 2.6 g/dL — ABNORMAL LOW (ref 3.5–5.0)
Alkaline Phosphatase: 154 U/L — ABNORMAL HIGH (ref 38–126)
Anion gap: 9 (ref 5–15)
BUN: 14 mg/dL (ref 6–20)
CO2: 30 mmol/L (ref 22–32)
Calcium: 9 mg/dL (ref 8.9–10.3)
Chloride: 95 mmol/L — ABNORMAL LOW (ref 98–111)
Creatinine, Ser: 0.5 mg/dL (ref 0.44–1.00)
GFR, Estimated: >60 mL/min
Glucose, Bld: 204 mg/dL — ABNORMAL HIGH (ref 70–99)
Potassium: 3.4 mmol/L — ABNORMAL LOW (ref 3.5–5.1)
Sodium: 134 mmol/L — ABNORMAL LOW (ref 135–145)
Total Bilirubin: 0.6 mg/dL (ref 0.0–1.2)
Total Protein: 6 g/dL — ABNORMAL LOW (ref 6.5–8.1)

## 2024-08-30 LAB — GLUCOSE, CAPILLARY
Glucose-Capillary: 189 mg/dL — ABNORMAL HIGH (ref 70–99)
Glucose-Capillary: 182 mg/dL — ABNORMAL HIGH (ref 70–99)
Glucose-Capillary: 165 mg/dL — ABNORMAL HIGH (ref 70–99)
Glucose-Capillary: 159 mg/dL — ABNORMAL HIGH (ref 70–99)
Glucose-Capillary: 181 mg/dL — ABNORMAL HIGH (ref 70–99)
Glucose-Capillary: 130 mg/dL — ABNORMAL HIGH (ref 70–99)

## 2024-08-30 LAB — CBC WITH DIFFERENTIAL/PLATELET
Abs Immature Granulocytes: 0.2 10*3/uL — ABNORMAL HIGH (ref 0.00–0.07)
Basophils Absolute: 0.1 10*3/uL (ref 0.0–0.1)
Basophils Relative: 1 %
Eosinophils Absolute: 0 10*3/uL (ref 0.0–0.5)
Eosinophils Relative: 0 %
HCT: 39 % (ref 36.0–46.0)
Hemoglobin: 12.8 g/dL (ref 12.0–15.0)
Immature Granulocytes: 2 %
Lymphocytes Relative: 8 %
Lymphs Abs: 0.7 10*3/uL (ref 0.7–4.0)
MCH: 28.5 pg (ref 26.0–34.0)
MCHC: 32.8 g/dL (ref 30.0–36.0)
MCV: 86.9 fL (ref 80.0–100.0)
Monocytes Absolute: 0.4 10*3/uL (ref 0.1–1.0)
Monocytes Relative: 4 %
Neutro Abs: 7.1 10*3/uL (ref 1.7–7.7)
Neutrophils Relative %: 85 %
Platelets: 126 10*3/uL — ABNORMAL LOW (ref 150–400)
RBC: 4.49 MIL/uL (ref 3.87–5.11)
RDW: 15.9 % — ABNORMAL HIGH (ref 11.5–15.5)
Smear Review: NORMAL
WBC: 8.4 10*3/uL (ref 4.0–10.5)
nRBC: 0.2 % (ref 0.0–0.2)

## 2024-08-30 MED ORDER — POTASSIUM CHLORIDE CRYS ER 20 MEQ PO TBCR
40.0000 meq | EXTENDED_RELEASE_TABLET | Freq: Once | ORAL | Status: AC
  Administered 2024-08-30: 40 meq via ORAL
  Filled 2024-08-30: qty 2

## 2024-08-30 MED ORDER — LIVING WELL WITH DIABETES BOOK
Freq: Once | Status: AC
  Filled 2024-08-30: qty 1

## 2024-08-30 MED ORDER — INSULIN GLARGINE-YFGN 100 UNIT/ML ~~LOC~~ SOLN
12.0000 [IU] | Freq: Two times a day (BID) | SUBCUTANEOUS | Status: AC
  Administered 2024-08-30 – 2024-08-31 (×3): 12 [IU] via SUBCUTANEOUS
  Filled 2024-08-30 (×4): qty 0.12

## 2024-08-30 MED ORDER — FUROSEMIDE 10 MG/ML IJ SOLN
40.0000 mg | Freq: Once | INTRAMUSCULAR | Status: AC
  Administered 2024-08-30: 40 mg via INTRAVENOUS
  Filled 2024-08-30: qty 4

## 2024-08-30 NOTE — Inpatient Diabetes Management (Addendum)
 Inpatient Diabetes Program Recommendations  AACE/ADA: New Consensus Statement on Inpatient Glycemic Control   Target Ranges:  Prepandial:   less than 140 mg/dL      Peak postprandial:   less than 180 mg/dL (1-2 hours)      Critically ill patients:  140 - 180 mg/dL   Lab Results  Component Value Date   GLUCAP 182 (H) 08/30/2024   HGBA1C 12.3 (A) 07/30/2024    Latest Reference Range & Units 08/29/24 07:38 08/29/24 11:41 08/29/24 15:52 08/29/24 20:20 08/29/24 23:53 08/30/24 03:29 08/30/24 08:09  Glucose-Capillary 70 - 99 mg/dL 839 (H)  Novolog  4 units   Glargine 10 units   217 (H)  Novolog  7 units  214 (H)  Novolog  7 units  234 (H)  Novolog  7 units  222 (H)  Novolog  7 units   Glargine 10 units  189 (H)  Novolog  4 units  182 (H)  Novolog  4 units   Glargine 10 units    Review of Glycemic Control  Diabetes history: DM2  Outpatient Diabetes medications:  Dexcom CGM  U-500 90 units TID  Metformin  500mg  daily   Current orders for Inpatient glycemic control:  Glargine 10 units BID  Novolog  0-20 units Q4HRS   Inpatient Diabetes Program Recommendations:   Noted patient has required 40 units of Novolog  within the past 24 hours for correction.   Please consider:  - Increase glargine to 12 units BID   Addendum @ 1400:   Spoke with patient about diabetes and home regimen for diabetes control. Patient reports being followed by Endocrinology [name], NP for diabetes management and currently taking U-500 90 units TID, Metformin  500mg  daily, and Dexcom CGM outpatient for diabetes control. Patient reports taking DM medications as prescribed. However, 4 days prior to admission she was sick and stopped eating and stop taking her insulin .  Patient denies her endocrinologist discussing a sick day plan. Encouraged patient to discuss this with her Endocrinologist at her next visit to help prevent DKA when she is sick in the future. Patient reports checking glucose 2-3 times per day  using her Dexcom CGM (uses Dexcom App on mobile device) and that it is usually in the 200's mg/dl in the morning and goes up in the 300's mg/dl during the day after eating. Inquired about prior A1C and patient reports not being able to recall last A1C value. Discussed A1C results 12.3% and explained that current A1C indicates an average glucose of 306 mg/dl over the past 2-3 months. Discussed glucose and A1C goals. Discussed importance of checking CBGs and maintaining good CBG control to prevent long-term and short-term complications. Explained how hyperglycemia leads to damage within blood vessels which lead to the common complications seen with uncontrolled diabetes. Discussed impact of nutrition, stress, sickness, and medications on diabetes control.  Discussed carbohydrates, carbohydrate goals per day and meal, along with portion sizes. Also encouraged patient to avoid regular sodas or juices. Encouraged patient to check glucose 4 times per day (before meals and at bedtime).  Patient verbalized understanding of information discussed and reports no further questions at this time related to diabetes.  Living Well with Diabetes Book ordered.   Thanks,  Jamie Search, RN, MSN, [place]  Inpatient Diabetes Coordinator  Pager [phone] (8a-5p)

## 2024-08-30 NOTE — Evaluation (Signed)
 Occupational Therapy Evaluation Patient Details Name: Jamie [name] MRN: [id number] DOB: [birth year] Today's Date: 08/30/2024   History of Present Illness   Pt is a 54 y.o female presented to [facility] 1/29 with severe abdominal pain. CT scan shows acute pancreatitis with early signs of necrosis, transferred to [facility] for further management. Became hypoxic, transferred to ICU and placed on Bipap 1/31. Chest x-ray showed possible small pleural effusions.  PMH: DM, HTN,  recurrent pancreatitis, bipolar, HLD     Clinical Impressions Pt admitted based on above, and was seen based on problem list below. PTA pt was independent with ADLs and IADLs. Today pt is requiring set up  to min  assist for ADLs. Functional transfers are  CGA with use of RW. Pt with VSS on 3L O2, however reporting RPE 10/10 with chair to bed transfer. Noted pt lethargic with flat affect, potentially impacting cog and performance today.  Based on today's performance, recommending <3 hours of skilled rehab daily, however, am hopeful with increased mobilization pt will progress to [facility] services. OT will continue to follow acutely to maximize functional independence.      If plan is discharge home, recommend the following:   A little help with walking and/or transfers;A little help with bathing/dressing/bathroom;Assistance with cooking/housework;Assist for transportation;Supervision due to cognitive status;Help with stairs or ramp for entrance     Functional Status Assessment   Patient has had a recent decline in their functional status and demonstrates the ability to make significant improvements in function in a reasonable and predictable amount of time.     Equipment Recommendations   Other (comment) (Defer to next venue)      Precautions/Restrictions   Precautions Precautions: Fall Recall of Precautions/Restrictions: Impaired Restrictions Weight Bearing Restrictions Per Provider Order: No     Mobility  Bed Mobility Overal bed mobility: Modified Independent             General bed mobility comments: Increased time and effort    Transfers Overall transfer level: Needs assistance Equipment used: Rolling walker (2 wheels) Transfers: Bed to chair/wheelchair/BSC, Sit to/from Stand Sit to Stand: Contact guard assist     Step pivot transfers: Contact guard assist     General transfer comment: Pt limited to step pivot transfer d/t fatigue      Balance Overall balance assessment: Needs assistance Sitting-balance support: Feet supported, No upper extremity supported Sitting balance-Leahy Scale: Fair     Standing balance support: Bilateral upper extremity supported, During functional activity, Reliant on assistive device for balance Standing balance-Leahy Scale: Poor Standing balance comment: Reliant on RW       ADL either performed or assessed with clinical judgement   ADL Overall ADL's : Needs assistance/impaired Eating/Feeding: Set up;Sitting   Grooming: Set up;Sitting   Upper Body Bathing: Set up;Sitting   Lower Body Bathing: Minimal assistance;Sit to/from stand   Upper Body Dressing : Set up;Sitting   Lower Body Dressing: Minimal assistance;Sit to/from stand   Toilet Transfer: Contact guard assist;Rolling walker (2 wheels) Toilet Transfer Details (indicate cue type and reason): short step pivot         Functional mobility during ADLs: Contact guard assist;Rolling walker (2 wheels) General ADL Comments: Limited by significantly decreased activity tolerance     Vision Baseline Vision/History: 0 No visual deficits Patient Visual Report: No change from baseline Vision Assessment?: No apparent visual deficits            Pertinent Vitals/Pain Pain Assessment Pain Assessment: No/denies pain  Extremity/Trunk Assessment Upper Extremity Assessment Upper Extremity Assessment: Generalized weakness   Lower Extremity Assessment Lower Extremity Assessment:  Defer to PT evaluation   Cervical / Trunk Assessment Cervical / Trunk Assessment: Kyphotic   Communication Communication Communication: No apparent difficulties   Cognition Arousal: Lethargic Behavior During Therapy: Flat affect Cognition: Cognition impaired     Awareness: Online awareness impaired   Attention impairment (select first level of impairment): Selective attention Executive functioning impairment (select all impairments): Organization, Sequencing, Problem solving, Initiation OT - Cognition Comments: Delayed processing, lethargy potentially impacting     Following commands: Impaired Following commands impaired: Follows multi-step commands with increased time     Cueing  General Comments   Cueing Techniques: Verbal cues  Mildly elevated HR 110s, O2 stable on 3L           Home Living Family/patient expects to be discharged to:: Private residence Living Arrangements: Spouse/significant other Available Help at Discharge: Family;Available PRN/intermittently Type of Home: House Home Access: Level entry     Home Layout: One level     Bathroom Shower/Tub: Producer, Television/film/video: Handicapped height     Home Equipment: Shower seat;Wheelchair - manual          Prior Functioning/Environment Prior Level of Function : Independent/Modified Independent             Mobility Comments: Ind, reports no AD in house, w/c in community ADLs Comments: Ind    OT Problem List: Cardiopulmonary status limiting activity;Decreased strength;Decreased activity tolerance;Impaired balance (sitting and/or standing);Decreased cognition;Decreased safety awareness   OT Treatment/Interventions: Self-care/ADL training;Therapeutic exercise;Energy conservation;DME and/or AE instruction;Therapeutic activities;Patient/family education;Balance training      OT Goals(Current goals can be found in the care plan section)   Acute Rehab OT Goals Patient Stated Goal: To  rest OT Goal Formulation: With patient Time For Goal Achievement: 09/13/24 Potential to Achieve Goals: Good   OT Frequency:  Min 2X/week       AM-PAC OT 6 Clicks Daily Activity     Outcome Measure Help from another person eating meals?: None Help from another person taking care of personal grooming?: A Little Help from another person toileting, which includes using toliet, bedpan, or urinal?: A Little Help from another person bathing (including washing, rinsing, drying)?: A Little Help from another person to put on and taking off regular upper body clothing?: A Little Help from another person to put on and taking off regular lower body clothing?: A Little 6 Click Score: 19   End of Session Equipment Utilized During Treatment: Gait belt;Rolling walker (2 wheels) Nurse Communication: Mobility status  Activity Tolerance: Patient limited by fatigue Patient left: in bed;with call bell/phone within reach;with bed alarm set  OT Visit Diagnosis: Unsteadiness on feet (R26.81);Other abnormalities of gait and mobility (R26.89);Muscle weakness (generalized) (M62.81)                Time: 1311-1330 OT Time Calculation (min): 19 min Charges:  OT General Charges $OT Visit: 1 Visit OT Evaluation $OT Eval Moderate Complexity: 1 Mod  Zenovia Justman C, OT  Acute Rehabilitation Services Office [phone] Secure chat preferred   Adrianne GORMAN Savers 08/30/2024, 1:45 PM

## 2024-08-30 NOTE — Consult Note (Addendum)
 "  Cardiology Consultation   Patient ID: MEGGIN OLA MRN: [id number]; DOB: [birth year]  Admit date: 08/23/2024 Date of Consult: 08/30/2024  PCP:  Jolee Greig DEL, PA-C   [facility] Providers Cardiologist:  Diannah SHAUNNA Maywood, MD        Patient Profile: Jamie [name] is a 54 y.o. female with a hx of nonobstructive CAD by cath 09/2023 (30% mLAD), mild LV dysfunction by cath 09/2023 with suspected chronic heart failure (?HFmrEF), HTN, HLD, migraines, anxiety, arthritis, DM2, PCOS, hiatal hernia, GERD, diverticulosis, arthritis, anxiety, hepatic steatosis, recurrent pancreatitis with hypertriglyceridemia, prior cholecystectomy, OSA on CPAP, depression, anxiety, peripheral neuropathy, remote intermittent thrombocytopenia on labs who is being seen 08/30/2024 for the evaluation of CHF at the request of Dr. Rebbecca.  History of Present Illness: Ms. Jamie [name] previously had an abnormal cor CT 08/2023 with suggestion of mLAD disease prompting cath 09/2023 that demonstrated EF 30% mLAD. Dr. Ladona suggested that could consider IVUS guided PCI but felt medical therapy was most appropriate.She was started on Ranexa  for possible microvascular disease. Cath noted mild LV systolic dysfunction; EF not reported. LVEDP was mildly elevated at , Prior echo in 2024 had demonstrated EF 60-65%. She is on Lasix  20mg  daily at home.  She presented to [facility] 08/21/24 with severe epigastric abdominal pain x 2 weeks and n/v. Lipase was 1832. Blood sugar was 300s-400s. AST 41, otherwise LFTs OK. CT a/p showed marked peripancreatic edema and induration c/w acute pancreatitis with early necrosis as well as fatty hepatomegaly. She was treated with fluid boluses. She was transferred to [facility] on 08/24/24 for further management. On arrival to [facility] she was mildly tachypneic, tachycardic and hypertensive. Labs continued to show elevated blood sugar, low CO2, low calcium , anion gap 22, lipase 262, triglycerides  315. She received dilaudid  to undergo MRCP but became lethargic and required BiPAP so MRCP deferred. CXR showed low lung volumes with bronchovascular crowding, mild pulmonary edema, hazy opacities, possibly small pleural effusions, cardiomegaly Received 2 doses of IV Lasix  on 1/31..She remained encephalopathic and required PCCM assistance. 2D echo was challenging due to body habitus and respiratory motion, suggested EF 45-50% with global HK, normal diastolic parameters, mildly reduced RVSF, mildly enlarged RV. LE duplex 08/28/24 neg for DVT. Labs today notable for Na 134, K 3.4, Cl 95, albumin  2.6, elevated alk phos 154, thrombocytopneia of 126. Troponins were neg x 2 and proBNP wnl earlier this admission.  She denies any recent chest pain, palpitations, edema, or syncope. She continues to note dyspnea particularly with exertion - helped nurse ambulate patient from bed to chair and there was increased WOB that improved once seated. Tele shows persistent sinus tach 110s-120s.   Past Medical History:  Diagnosis Date   Anxiety    Arthritis    knees   Bipolar 1 disorder (HCC)    Chronic diarrhea    Diverticulosis of colon    GERD (gastroesophageal reflux disease)    Hiatal hernia    History of concussion    per pt 11/ 2020 w/ no residual   Hyperlipidemia    Hypertension    followed by pcp   (11-29-2019  per pt stated never had stress test)   MDD (major depressive disorder)    Migraine    neurologist--- dr onita   OSA on CPAP    followed by dr dohmeier   PCOS (polycystic ovarian syndrome)    PONV (postoperative nausea and vomiting)    Type 2 diabetes mellitus (HCC)  followed by pcp   (11-29-2019  pt stated checks blood sugar daily in am,  fasting sugar--- 96    Past Surgical History:  Procedure Laterality Date   ANAL FISSURE REPAIR  x3  last one  2007  approx.   BIOPSY  09/06/2019   Procedure: BIOPSY;  Surgeon: Shaaron Lamar HERO, MD;  Location: [facility];  Service: Endoscopy;;    COLONOSCOPY WITH PROPOFOL  [name] 09/06/2019   pancolonic diverticulosis. Segmental biopsy completed with benign biopsies.    ESOPHAGOGASTRODUODENOSCOPY (EGD) WITH PROPOFOL  [name] 06/16/2020   Dr. Shaaron: normal esophagus s/p dilation, normal stomach, duodenum.    ESOPHAGOGASTRODUODENOSCOPY (EGD) WITH PROPOFOL  [name] 02/10/2022   Procedure: ESOPHAGOGASTRODUODENOSCOPY (EGD) WITH PROPOFOL ;  Surgeon: Shaaron Lamar HERO, MD;  Location: [facility];  Service: Endoscopy;  Laterality: [name];  9:45am   EVALUATION UNDER ANESTHESIA WITH HEMORRHOIDECTOMY [name] 12/05/2019   Procedure: ANORECTAL EXAM UNDER ANESTHESIA WITH HEMORRHOIDECTOMY, HEMORRHOIDAL LIGATION/PEXY;  Surgeon: Sheldon Standing, MD;  Location: [facility];  Service: General;  Laterality: [name];  GENERAL AND LOCAL   EVALUATION UNDER ANESTHESIA WITH TEAR DUCT PROBING  infant   KNEE ARTHROSCOPY Right 1994   LAPAROSCOPIC CHOLECYSTECTOMY  08-06-2002  @[facility]    LEFT HEART CATH AND CORONARY ANGIOGRAPHY [name] 10/03/2023   Procedure: LEFT HEART CATH AND CORONARY ANGIOGRAPHY;  Surgeon: Ladona Heinz, MD;  Location: [facility];  Service: Cardiovascular;  Laterality: [name];   LUMBAR LAMINECTOMY/DECOMPRESSION MICRODISCECTOMY Right 01/22/2014   Procedure: LUMBAR FOUR TO FIVE LUMBAR LAMINECTOMY/DECOMPRESSION MICRODISCECTOMY 1 LEVEL;  Surgeon: Catalina HERO Stains, MD;  Location: [facility];  Service: Neurosurgery;  Laterality: Right;  Right L45 diskectomy   MALONEY DILATION [name] 06/16/2020   Procedure: AGAPITO DILATION;  Surgeon: Shaaron Lamar HERO, MD;  Location: [facility];  Service: Endoscopy;  Laterality: [name];   MALONEY DILATION [name] 02/10/2022   Procedure: AGAPITO DILATION;  Surgeon: Shaaron Lamar HERO, MD;  Location: [facility];  Service: Endoscopy;  Laterality: [name];     Home Medications:  Prior to Admission medications  Medication Sig Start Date End Date Taking? Authorizing Provider  albuterol  (VENTOLIN  HFA) 108 (90 Base) MCG/ACT inhaler Inhale 2 puffs into the  lungs every 4 (four) hours as needed for wheezing or shortness of breath. 06/04/23  Yes Cleotilde Rogue, MD  amoxicillin (AMOXIL) 875 MG tablet Take 875 mg by mouth 2 (two) times daily. 08/17/24  Yes [provider]  ARIPiprazole  (ABILIFY ) 20 MG tablet Take 1 tablet (20 mg total) by mouth daily. 08/15/24  Yes Plovsky, Elna, MD  clonazePAM  (KLONOPIN ) 1 MG tablet Take 1 mg in the morning and 2 mg at night 08/15/24  Yes Plovsky, Elna, MD  dexlansoprazole  (DEXILANT ) 60 MG capsule Take 1 capsule (60 mg total) by mouth daily. 06/05/24  Yes Kennedy Charmaine CROME, NP  Dextromethorphan-buPROPion  ER (AUVELITY ) 45-105 MG TBCR 1 qam for 1 week then 1 bid 08/15/24  Yes Plovsky, Elna, MD  dicyclomine  (BENTYL ) 10 MG capsule Take 1 capsule (10 mg total) by mouth 2 (two) times daily as needed for spasms. 10/27/23  Yes Mahon, Charmaine CROME, NP  DULoxetine  (CYMBALTA ) 60 MG capsule Take 2 capsules (120 mg total) by mouth at bedtime. 08/15/24  Yes Plovsky, Elna, MD  fenofibrate  micronized (LOFIBRA) 134 MG capsule TAKE ONE CAPSULE BY MOUTH DAILY 01/05/24  Yes Mallipeddi, Vishnu P, MD  fluticasone  (FLONASE) 50 MCG/ACT nasal spray Place 1 spray into both nostrils at bedtime as needed for allergies or rhinitis.    Yes [provider]  furosemide  (LASIX ) 20 MG tablet TAKE 1 TABLET BY MOUTH DAILY 05/24/24  Yes Mallipeddi, Vishnu P, MD  gabapentin  (NEURONTIN ) 600 MG tablet Take 600 mg by mouth 2 (two) times daily. 05/18/23  Yes [provider]  insulin  regular human CONCENTRATED (HUMULIN  R U-500 KWIKPEN) 500 UNIT/ML KwikPen Inject 90 Units into the skin 3 (three) times daily with meals. 07/30/24  Yes [name] Benton PARAS, NP  metFORMIN  (GLUCOPHAGE -XR) 500 MG 24 hr tablet Take 1 tablet (500 mg total) by mouth daily with breakfast. 07/30/24  Yes Reardon, Benton PARAS, NP  metoprolol  tartrate (LOPRESSOR ) 25 MG tablet TAKE 1 TABLET BY MOUTH TWICE DAILY 06/27/24  Yes Mallipeddi, Vishnu P, MD  Multiple Vitamins-Minerals  (MULTIVITAMIN WITH MINERALS) tablet Take 1 tablet by mouth daily.   Yes [provider]  nabumetone (RELAFEN) 750 MG tablet Take 750 mg by mouth 2 (two) times daily. 06/19/24  Yes [provider]  nitroGLYCERIN  (NITROSTAT ) 0.4 MG SL tablet Place 1 tablet (0.4 mg total) under the tongue every 5 (five) minutes x 3 doses as needed (if no relief after 3rd dose proceed to ED or call 911). 08/09/23  Yes Mallipeddi, Vishnu P, MD  omega-3 acid ethyl esters (LOVAZA ) 1 g capsule Take 2 g by mouth 2 (two) times daily. 10/22/22  Yes [provider]  omeprazole  (PRILOSEC) 40 MG capsule TAKE ONE CAPSULE BY MOUTH DAILY 02/02/24  Yes Mahon, Courtney L, NP  ondansetron  (ZOFRAN -ODT) 4 MG disintegrating tablet TAKE 1 TABLET BY MOUTH EVERY 8 HOURS AS NEEDED FOR NAUSEA AND VOMITING 06/05/24  Yes Mahon, Courtney L, NP  Pancrelipase , Lip-Prot-Amyl, (ZENPEP ) 40000-126000 units CPEP TAKE TWO CAPSULES BY MOUTH THREE TIMES DAILY WITH MEALS AND TAKE 1 CAPSULE with AT least 2 snacks 06/05/24  Yes Mahon, Courtney L, NP  ranolazine  (RANEXA ) 500 MG 12 hr tablet Take 1 tablet (500 mg total) by mouth 2 (two) times daily. 06/06/24  Yes Miriam Norris, NP  rosuvastatin  (CRESTOR ) 10 MG tablet TAKE 1 TABLET BY MOUTH AT BEDTIME 07/05/24  Yes Miriam Norris, NP  traZODone  (DESYREL ) 100 MG tablet Take 4 tablets (400 mg total) by mouth at bedtime. 08/15/24  Yes Plovsky, Elna, MD  ACCU-CHEK GUIDE test strip  05/18/23   [provider]  Continuous Glucose Sensor (DEXCOM G7 SENSOR) MISC USE AS DIRECTED AND CHANGE EVERY 10 DAYS 07/30/24   [name] Benton PARAS, NP  Insulin  Pen Needle (CARETOUCH PEN NEEDLES) 31G X 6 MM MISC Use to inject insulin  3 times daily 07/30/24   [name] Benton PARAS, NP  valsartan  (DIOVAN ) 80 MG tablet TAKE 1 TABLET BY MOUTH DAILY 08/30/24   Wert, Michael B, MD    Scheduled Meds:  arformoterol   15 mcg Nebulization BID   ARIPiprazole   20 mg Oral Daily   budesonide  (PULMICORT ) nebulizer solution   0.5 mg Nebulization BID   Chlorhexidine  Gluconate Cloth  6 each Topical Daily   clonazePAM   1 mg Oral q morning   And   clonazePAM   2 mg Oral QHS   DULoxetine   120 mg Oral QHS   fenofibrate   160 mg Oral Daily   gabapentin   300 mg Oral BID   heparin   5,000 Units Subcutaneous Q8H   insulin  aspart  0-20 Units Subcutaneous Q4H   insulin  glargine-yfgn  10 Units Subcutaneous BID   metoprolol  tartrate  25 mg Oral BID   omega-3 acid ethyl esters  2 g Oral BID   pantoprazole   40 mg Oral Daily   ranolazine   500 mg Oral  BID   rosuvastatin   10 mg Oral QHS   traZODone   100 mg Oral QHS   Continuous Infusions:  PRN Meds: acetaminophen , dextrose , fentaNYL  (SUBLIMAZE ) injection, hydrALAZINE , liver oil-zinc  oxide, loperamide , naLOXone  (NARCAN )  injection  Allergies:   Allergies[1]  Social History:   Social History   Socioeconomic History   Marital status: Married    Spouse name: Not on file   Number of children: 0   Years of education: college   Highest education level: Not on file  Occupational History   Occupation: disabled  Tobacco Use   Smoking status: Never   Smokeless tobacco: Never  Vaping Use   Vaping status: Never Used  Substance and Sexual Activity   Alcohol  use: No   Drug use: Never   Sexual activity: Yes    Partners: Male    Birth control/protection: None  Other Topics Concern   Not on file  Social History Narrative   Patient lives at home alone    Patient is right handed   Patient drinks soda's daily   Social Drivers of Health   Tobacco Use: Low Risk (08/24/2024)   Patient History    Smoking Tobacco Use: Never    Smokeless Tobacco Use: Never    Passive Exposure: Not on file  Financial Resource Strain: Not on file  Food Insecurity: No Food Insecurity (08/23/2024)   Epic    Worried About Programme Researcher, Broadcasting/film/video in the Last Year: Never true    Ran Out of Food in the Last Year: Never true  Transportation Needs: No Transportation Needs (08/23/2024)   Epic    Lack of  Transportation (Medical): No    Lack of Transportation (Non-Medical): No  Physical Activity: Not on file  Stress: Not on file  Social Connections: Not on file  Intimate Partner Violence: Not At Risk (08/23/2024)   Epic    Fear of Current or Ex-Partner: No    Emotionally Abused: No    Physically Abused: No    Sexually Abused: No  Depression (PHQ2-9): High Risk (01/17/2024)   Depression (PHQ2-9)    PHQ-2 Score: 19  Alcohol  Screen: Not on file  Housing: Low Risk (08/23/2024)   Epic    Unable to Pay for Housing in the Last Year: No    Number of Times Moved in the Last Year: 0    Homeless in the Last Year: No  Utilities: Not At Risk (08/23/2024)   Epic    Threatened with loss of utilities: No  Health Literacy: Low Risk (09/03/2021)   Received from [facility]    How often do you need to have someone help you when you read instructions, pamphlets, or other written material from your doctor or pharmacy?: Never    Family History:    Family History  Problem Relation Age of Onset   COPD Father    Anxiety disorder Maternal Grandmother    Depression Maternal Grandmother    Colon cancer Neg Hx    Colon polyps Neg Hx      ROS:  Please see the history of present illness.  All other ROS reviewed and negative.     Physical Exam/Data: Vitals:   08/30/24 0802 08/30/24 0834 08/30/24 1117 08/30/24 1118  BP: 139/79  129/85   Pulse: (!) 120  (!) 104   Resp: 16  16   Temp: 98.2 F (36.8 C)  97.9 F (36.6 C) 97.9 F (36.6 C)  TempSrc: Oral  Oral Oral  SpO2: 98% 98% 99%   Weight:      Height:       No intake or output data in the 24 hours ending 08/30/24 1151    08/23/2024   10:00 PM 08/23/2024    7:40 PM 08/15/2024    2:07 PM  Last 3 Weights  Weight (lbs) 240 lb 4.8 oz 240 lb 4.8 oz   Weight (kg) 109 kg 109 kg      Information is confidential and restricted. Go to Review Flowsheets to unlock data.     Body mass index is 42.57 kg/m.  General: Well developed  morbidly obese WF in no acute distress. Head: Normocephalic, atraumatic, sclera non-icteric, no xanthomas, nares are without discharge. Neck: Negative for carotid bruits. JVP not elevated. Lungs: Mildly diminished throughout without wheezes, rales, or rhonchi. Breathing is unlabored. Heart: RRR S1 S2 without murmurs, rubs, or gallops.  Abdomen: Soft but rounded pannus with normoactive bowel sounds. No rebound/guarding. Extremities: No clubbing or cyanosis. No edema. Distal pedal pulses are 2+ and equal bilaterally. Neuro: Alert and oriented X 3. Moves all extremities spontaneously. Psych:  Responds to questions with a flat affect.  EKG:  The EKG was personally reviewed and demonstrates:   08/24/24: sinus tach 116bpm, low voltage, no acute STT changes, baseline artifact 08/26/24: NSR 88bpm, baseline artifact otherwise no acute STT changes  Telemetry:  Telemetry was personally reviewed and demonstrates:  sinus tachycardia  Relevant CV Studies:  2D echo 08/26/24   1. Images are limited.   2. Left ventricular ejection fraction, by estimation, is 45 to 50%. The  left ventricle has mildly decreased function. The left ventricle  demonstrates global hypokinesis. Left ventricular diastolic parameters  were normal.   3. No formed LV mural thrombus evident by Definity  contrast.   4. Right ventricular systolic function is mildly reduced. The right  ventricular size is mildly enlarged. There is normal pulmonary artery  systolic pressure. The estimated right ventricular systolic pressure is  25.5 mmHg.   5. The mitral valve is grossly normal. Trivial mitral valve  regurgitation.   6. The aortic valve was not well visualized. Aortic valve regurgitation  is not visualized.   7. The inferior vena cava is normal in size with <50% respiratory  variability, suggesting right atrial pressure of 8 mmHg.   Comparison(s): Prior images reviewed side by side. LVEF approximately  45-50% with global hypokinesis,  mild RV dysfunction with normal estimated  RVSP.   Cath 09/2023    Mid LAD lesion is 30% stenosed.   There is mild left ventricular systolic dysfunction.   Recommend Aspirin  81mg  daily for moderate CAD.   Left Heart Catheterization 10/03/23: Hemodynamic data: LVEDP mildly elevated at 18 mmHg.  There is no pressure gradient across the aortic valve.   Angiographic data: LM: Large-caliber vessel.  It is smooth and normal. LAD: Gives origin to a very large proximal D1 and a moderate-sized D2, at the origin of D2 there is a focal calcific 30% stenosis.  Otherwise the LAD is smooth and normal. LCx: Is a moderate caliber vessel, smooth and normal.  There is no significant marginal branches. RCA: Large-caliber vessel, smooth and normal, gives origin to a large PDA and large PL branch.  Laboratory Data: High Sensitivity Troponin:  No results for input(s): TROPONINIHS in the last 720 hours.  Recent Labs  Lab 08/24/24 0619 08/24/24 1349  TRNPT 8 9      Chemistry Recent Labs  Lab 08/24/24 [phone] 08/24/24 1031 08/25/24 [id number]  08/26/24 0705 08/28/24 0206 08/29/24 1229 08/30/24 0201  NA  --    < > 140   < > 138 137 134*  K  --    < > 4.4   < > 3.5 3.5 3.4*  CL  --    < > 104   < > 99 95* 95*  CO2  --    < > 23   < > 26 32 30  GLUCOSE  --    < > 193*   < > 170* 229* 204*  BUN  --    < > 27*   < > 19 19 14   CREATININE  --    < > 0.78   < > 0.63 0.59 0.50  CALCIUM   --    < > 8.2*   < > 8.3* 8.8* 9.0  MG 2.1  --  2.4  --   --   --   --   GFRNONAA  --    < > >60   < > >60 >60 >60  ANIONGAP  --    < > 14   < > 14 10 9    < > = values in this interval not displayed.    Recent Labs  Lab 08/26/24 0705 08/29/24 1229 08/30/24 0201  PROT 6.5 6.2* 6.0*  ALBUMIN  3.2* 2.8* 2.6*  AST 95* 26 25  ALT 60* 38 30  ALKPHOS 141* 176* 154*  BILITOT 0.5 0.5 0.6   Lipids  Recent Labs  Lab 08/25/24 0553  TRIG 229*    Hematology Recent Labs  Lab 08/28/24 0206 08/29/24 1229 08/30/24 0201   WBC 8.2 8.7 8.4  RBC 4.22 4.36 4.49  HGB 12.2 12.5 12.8  HCT 37.4 39.2 39.0  MCV 88.6 89.9 86.9  MCH 28.9 28.7 28.5  MCHC 32.6 31.9 32.8  RDW 16.3* 16.3* 15.9*  PLT 125* 114* 126*   Thyroid   Recent Labs  Lab 08/24/24 0010  TSH 1.170    BNP Recent Labs  Lab 08/25/24 0928  PROBNP 205.0    DDimer No results for input(s): DDIMER in the last 168 hours.  Radiology/Studies:  VAS US  LOWER EXTREMITY VENOUS (DVT) Result Date: 08/28/2024  Lower Venous DVT Study Patient Name:  KEMESHA MOSEY [facility]  Date of Exam:   08/28/2024 Medical Rec #: [id number]          Accession #:    [id number] Date of Birth: 12/16/1970          Patient Gender: F Patient Age:   32 years Exam [facility]  [facility] Procedure:      VAS US  LOWER EXTREMITY VENOUS (DVT) Referring Phys: DORN CHILL --------------------------------------------------------------------------------  Indications: Edema.  Limitations: Body habitus and patient position. Comparison Study: No prior exam. Performing Technologist: Edilia Elden Appl  Examination Guidelines: A complete evaluation includes B-mode imaging, spectral Doppler, color Doppler, and power Doppler as needed of all accessible portions of each vessel. Bilateral testing is considered an integral part of a complete examination. Limited examinations for reoccurring indications may be performed as noted. The reflux portion of the exam is performed with the patient in reverse Trendelenburg.  +---------+---------------+---------+-----------+----------+--------------+ RIGHT    CompressibilityPhasicitySpontaneityPropertiesThrombus Aging +---------+---------------+---------+-----------+----------+--------------+ CFV      Full           Yes      Yes                                 +---------+---------------+---------+-----------+----------+--------------+  SFJ      Full           Yes      Yes                                  +---------+---------------+---------+-----------+----------+--------------+ FV Prox  Full                                                        +---------+---------------+---------+-----------+----------+--------------+ FV Mid   Full                                                        +---------+---------------+---------+-----------+----------+--------------+ FV DistalFull                                                        +---------+---------------+---------+-----------+----------+--------------+ PFV      Full                                                        +---------+---------------+---------+-----------+----------+--------------+ POP      Full           Yes      Yes                                 +---------+---------------+---------+-----------+----------+--------------+ PTV      Full                                                        +---------+---------------+---------+-----------+----------+--------------+ PERO     Full                                                        +---------+---------------+---------+-----------+----------+--------------+   +---------+---------------+---------+-----------+----------+--------------+ LEFT     CompressibilityPhasicitySpontaneityPropertiesThrombus Aging +---------+---------------+---------+-----------+----------+--------------+ CFV      Full           Yes      Yes                                 +---------+---------------+---------+-----------+----------+--------------+ SFJ      Full           Yes      Yes                                 +---------+---------------+---------+-----------+----------+--------------+  FV Prox  Full                                                        +---------+---------------+---------+-----------+----------+--------------+ FV Mid   Full                                                         +---------+---------------+---------+-----------+----------+--------------+ FV DistalFull                                                        +---------+---------------+---------+-----------+----------+--------------+ PFV      Full                                                        +---------+---------------+---------+-----------+----------+--------------+ POP      Full           Yes      Yes                                 +---------+---------------+---------+-----------+----------+--------------+ PTV      Full                                                        +---------+---------------+---------+-----------+----------+--------------+ PERO     Full                                                        +---------+---------------+---------+-----------+----------+--------------+     Summary: BILATERAL: - No evidence of deep vein thrombosis seen in the lower extremities, bilaterally. -No evidence of popliteal cyst, bilaterally.   *See table(s) above for measurements and observations. Electronically signed by Penne Colorado MD on 08/28/2024 at 3:16:26 PM.    Final      Assessment and Plan:  1. Acute on suspected chronic HFmrEF - echo in 2024 showed normal LVEF, but cath 09/2023 referenced mild LV dysfunction (% not reported) with mildly elevated LVEDP so suspect chronic underlying heart failure - will ask MD to review echocardiogram for LVEF comparison to prior, etiology of decline not entirely clear - patient denies any anginal symptoms prior to recent admission, only SOB the last few days - hsTroponins neg x2 on arrival - has known mild-moderate CAD with 30% LAD in 09/2023 with possible underlying microvascular ischemia - does not presently appear a great candidate to revisit ischemic workup acutely, will review with MD - trial of diuresis would be reasonable - with underlying sinus tach,  also discussed PE workup with IM who will consider - given tendency for  low-normal BP this admission, hold off aggressive GDMT for now. Remains on metoprolol  25mg  BID, tolerating. Spiro might be an option going forward - would avoid SGLT2i with pancreatic disorder - addendum: d/w Dr. Raford, will get updated 2v CXR. K 3.4 today (not provoked by diuresis), received 40meq KCl. Will give another 40meq KCl now with 40mg  IV Lasix  x1 and follow. Also ordered strict I/O's and daily weights  2. Sinus tachycardia - previous OP HRs 80s-90s, now 110-120s - TSH OK - treat underlying medical illness - continue metoprolol  25mg  BID  3. CAD - hsTroponins neg x2 - not on ASA at home in setting of nonocclusive disease - continue rosuvastatin , defer dose escalation due to acute medical illness - continue ranolazine  500mg  BID for microvascular angina  4. OSA with morbid obesity - reports adherence with CPAP, was ordered for BiPAP here - query component of OHS as well - check ambulatory O2 sat off O2 - not felt to be a good candidate for GLP1 give hx of chronic pancreatitis  5. Hypokalemia - primary team repleting  6. HLD with hx of hypertriglyceridemia - suspect metabolic syndrome type of picture in setting of PCOS - TSH OK - continue rosuvastatin , defer dose escalation due to acute medical illness - at home, also on fenofibrate  and Lovaza  2g BID at home, continued here -> ?consideration of Vascepa in place of Lovaza    Risk Assessment/Risk Scores:       [place]  Heart Association (NYHA) Functional Class NYHA Class III       For questions or updates, please contact [facility] Please consult [email or web address] for contact info under      Signed, Keeven Matty N Rashanna Christiana, PA-C  08/30/2024 11:51 AM     [1]  Allergies Allergen Reactions   Codeine Hives and Swelling   Ozempic (0.25 Or 0.5 Mg-Dose) [Semaglutide(0.25 Or 0.5mg -Dos)]     pancreatitis   Morphine  And Codeine Swelling and Rash   "

## 2024-08-30 NOTE — Progress Notes (Signed)
 " PROGRESS NOTE  Jamie [name]  FMW:[id number] DOB: [birth year] DOA: 08/23/2024 PCP: Jolee Greig DEL, PA-C   Brief Narrative: Patient is a 54 year old female with history of hypertension, hyperlipidemia, OSA on CPAP, recurrent pancreatitis secondary to hypertriglyceridemia, type 2 diabetes, bipolar 1 disorder, anxiety who was transferred from [facility] for further management.  She presented there with acute pancreatitis, early signs of necrosis and was sent here.  On plantation, she was tachypneic, tachycardic, hypertensive.  Lab work showed a CO2 of 17, elevated blood glucose, anion gap of 22, lipase of 262, triglyceride of 315.  While undergoing MRCP here on 1/30, she became lethargic after getting 0.5 mg Dilaudid  and had to be put on BiPAP.  She remained encephalopathic so PCCM was consulted and was transferred to ICU, briefly required Precedex  drip.  Respiratory status has improved.  Currently on  nasal cannula.  Patient transferred to TRH service on 2/5.  Significant events:  1/30 presented from outside hospital for ongoing management at severe pancreatitis, and DKA on arrival 1/31 progressive hypoxic and hypercapnic respiratory failure requiring BiPAP dependence prompting PCCM consult and transfer to ICU  Assessment & Plan:  Principal Problem:   Acute pancreatitis Active Problems:   DM2 (diabetes mellitus, type 2) (HCC)   OSA on CPAP   GERD (gastroesophageal reflux disease)   Hypertriglyceridemia without hypercholesterolemia   Essential hypertension   Chronic pancreatitis (HCC)   CAD (coronary artery disease)   Generalized abdominal pain   Bilious vomiting with nausea   Acute recurrent pancreatitis   Acute hypoxemic respiratory failure (HCC)   Acute hypoxic/hypercapnic respiratory failure/history of sleep apnea: Became encephalopathic/somnolent after giving Dilaudid .  Initially required BiPAP.  Now she has been weaned to room air/nasal cannula.  Continue to wean.  Continue  aspiration precaution.   Continue bronchodilators as needed.  We will check d dimer  Acute pancreatitis with early signs of necrosis due to hypertriglyceridemia: CT abdomen/pelvis showed marked peripancreatic edema, induration suggesting acute pancreatitis with subtle signs of necrosis.  GI  were following.  Currently denies abdominal pain.  Abdomen is benign examination.  Tolerating diet  Hypertriglyceridemia: She takes fenofibrate , Crestor , Zenpep .  Should continue  Acute HFrEF: Appears volume overloaded today. Echo done around 2/26 showed EF of 40 to 45%, wall motion abnormality, mild right ventricular dysfunction.  Her echo on February 24, 2023 showed EF of 60 to 65%, normal symptomatic, normal right ventricular function.  Cardiology consulted. Might benefit from IV diuresis but will defer to cardiology  Type 2 diabetes/DKA: Came with DKA.  DKA is resolved.  Currently on sliding scale and long-acting insulin .  Continue to monitor.  Last A1c of 12.3 as per 07/30/2024.  Being followed by diabetic coordinator  Hypokalemia: Resolved  Hypertension: Home meds on hold.  Currently normotensive  History of coronary artery disease: No anginal symptoms.  No chest pain.  On ranolazine , metoprolol   Depression/anxiety: On trazodone , Abilify , duloxetine , Klonopin   Peripheral neuropathy: Gabapentin  300 mg twice daily at home, currently on  reduced dose  GERD: Continue PPI  Hypokalemia: Supplemented potassium  OSA: Continue CPAP at bedtime.  She has severe machine at home but not sure she was compliant  Debility/deconditioning: Patient is from home.  Lived with her husband.  PT/OT consulted       DVT prophylaxis:heparin  injection 5,000 Units Start: 08/24/24 0600 SCDs Start: 08/24/24 0124     Code Status: Full Code  Family Communication: None at bedside  Patient status:Inpatient  Patient is from :Home  Anticipated discharge  un:ynfz vs SNF  Estimated DC date:2-3 days   Consultants:  GI,pccm  Procedures:None  Antimicrobials:  Anti-infectives (From admission, onward)    None       Subjective: Patient seen and examined at bedside today.  Hemodynamically stable.  Lying on bed.  She was on 3 L of oxygen  per minute.  She complains of shortness of breath even at rest.  Appears volume overloaded though she does not have peripheral edema.  Denies chest pain or abdominal pain.  Objective: Vitals:   08/29/24 2032 08/30/24 0046 08/30/24 0435 08/30/24 0802  BP:  126/75 124/82 139/79  Pulse: (!) 103 100  (!) 120  Resp:  17 18 16   Temp:  98.3 F (36.8 C) (!) 100.6 F (38.1 C) 98.2 F (36.8 C)  TempSrc:  Oral Oral Oral  SpO2: 99% 100% 100% 98%  Weight:      Height:       No intake or output data in the 24 hours ending 08/30/24 0807 Filed Weights   08/23/24 1940 08/23/24 2200  Weight: 109 kg 109 kg    Examination:  General exam: Overall comfortable, not in distress,morbidly obese HEENT: PERRL Respiratory system: Fine crackles on bilateral bases Cardiovascular system: Mild sinus tachycardia Gastrointestinal system: Abdomen is nondistended, soft and nontender. Central nervous system: Alert and oriented Extremities: No edema, no clubbing ,no cyanosis Skin: No rashes, no ulcers,no icterus     Data Reviewed: I have personally reviewed following labs and imaging studies  CBC: Recent Labs  Lab 08/24/24 0010 08/24/24 0536 08/26/24 0705 08/27/24 0015 08/28/24 0206 08/29/24 1229 08/30/24 0201  WBC 10.4   < > 5.8 4.7 8.2 8.7 8.4  NEUTROABS 8.4*  --   --  3.4 6.8 7.3 7.1  HGB 13.5   < > 10.9* 11.1* 12.2 12.5 12.8  HCT 41.5   < > 33.6* 34.9* 37.4 39.2 39.0  MCV 89.2   < > 89.1 89.9 88.6 89.9 86.9  PLT 151   < > 118* 122* 125* 114* 126*   < > = values in this interval not displayed.   Basic Metabolic Panel: Recent Labs  Lab 08/24/24 0619 08/24/24 1031 08/25/24 0928 08/25/24 2145 08/26/24 0705 08/27/24 0015 08/28/24 0206 08/29/24 1229 08/30/24 0201   NA  --    < > 140  --  146* 142 138 137 134*  K  --    < > 4.4  --  3.7 3.2* 3.5 3.5 3.4*  CL  --    < > 104  --  105 102 99 95* 95*  CO2  --    < > 23  --  28 27 26  32 30  GLUCOSE  --    < > 193*  --  234* 213* 170* 229* 204*  BUN  --    < > 27*  --  32* 31* 19 19 14   CREATININE  --    < > 0.78  --  0.94 0.79 0.63 0.59 0.50  CALCIUM   --    < > 8.2*  --  8.3* 8.4* 8.3* 8.8* 9.0  MG 2.1  --  2.4  --   --   --   --   --   --   PHOS  --   --  1.4* 2.6  --   --   --   --   --    < > = values in this interval not displayed.     Recent Results (from the  past 240 hours)  Surgical PCR screen     Status: Abnormal   Collection Time: 08/23/24  8:00 PM   Specimen: Nasal Mucosa; Nasal Swab  Result Value Ref Range Status   MRSA, PCR NEGATIVE NEGATIVE Final   Staphylococcus aureus POSITIVE (A) NEGATIVE Final    Comment: (NOTE) The Xpert SA Assay (FDA approved for NASAL specimens in patients 9 years of age and older), is one component of a comprehensive surveillance program. It is not intended to diagnose infection nor to guide or monitor treatment. Performed at [facility], [address], [place], [place] [address]      Radiology Studies: VAS US  LOWER EXTREMITY VENOUS (DVT) Result Date: 08/28/2024  Lower Venous DVT Study Patient Name:  JUDYTH DEMARAIS [facility]  Date of Exam:   08/28/2024 Medical Rec #: [id number]          Accession #:    [id number] Date of Birth: June 12, 1971          Patient Gender: F Patient Age:   10 years Exam Location:  [facility] Procedure:      VAS US  LOWER EXTREMITY VENOUS (DVT) Referring Phys: DORN CHILL --------------------------------------------------------------------------------  Indications: Edema.  Limitations: Body habitus and patient position. Comparison Study: No prior exam. Performing Technologist: Edilia Elden Appl  Examination Guidelines: A complete evaluation includes B-mode imaging, spectral Doppler, color Doppler, and power Doppler as needed of  all accessible portions of each vessel. Bilateral testing is considered an integral part of a complete examination. Limited examinations for reoccurring indications may be performed as noted. The reflux portion of the exam is performed with the patient in reverse Trendelenburg.  +---------+---------------+---------+-----------+----------+--------------+ RIGHT    CompressibilityPhasicitySpontaneityPropertiesThrombus Aging +---------+---------------+---------+-----------+----------+--------------+ CFV      Full           Yes      Yes                                 +---------+---------------+---------+-----------+----------+--------------+ SFJ      Full           Yes      Yes                                 +---------+---------------+---------+-----------+----------+--------------+ FV Prox  Full                                                        +---------+---------------+---------+-----------+----------+--------------+ FV Mid   Full                                                        +---------+---------------+---------+-----------+----------+--------------+ FV DistalFull                                                        +---------+---------------+---------+-----------+----------+--------------+ PFV      Full                                                        +---------+---------------+---------+-----------+----------+--------------+  POP      Full           Yes      Yes                                 +---------+---------------+---------+-----------+----------+--------------+ PTV      Full                                                        +---------+---------------+---------+-----------+----------+--------------+ PERO     Full                                                        +---------+---------------+---------+-----------+----------+--------------+    +---------+---------------+---------+-----------+----------+--------------+ LEFT     CompressibilityPhasicitySpontaneityPropertiesThrombus Aging +---------+---------------+---------+-----------+----------+--------------+ CFV      Full           Yes      Yes                                 +---------+---------------+---------+-----------+----------+--------------+ SFJ      Full           Yes      Yes                                 +---------+---------------+---------+-----------+----------+--------------+ FV Prox  Full                                                        +---------+---------------+---------+-----------+----------+--------------+ FV Mid   Full                                                        +---------+---------------+---------+-----------+----------+--------------+ FV DistalFull                                                        +---------+---------------+---------+-----------+----------+--------------+ PFV      Full                                                        +---------+---------------+---------+-----------+----------+--------------+ POP      Full           Yes      Yes                                 +---------+---------------+---------+-----------+----------+--------------+  PTV      Full                                                        +---------+---------------+---------+-----------+----------+--------------+ PERO     Full                                                        +---------+---------------+---------+-----------+----------+--------------+     Summary: BILATERAL: - No evidence of deep vein thrombosis seen in the lower extremities, bilaterally. -No evidence of popliteal cyst, bilaterally.   *See table(s) above for measurements and observations. Electronically signed by Penne Colorado MD on 08/28/2024 at 3:16:26 PM.    Final     Scheduled Meds:  arformoterol   15 mcg Nebulization BID    ARIPiprazole   20 mg Oral Daily   budesonide  (PULMICORT ) nebulizer solution  0.5 mg Nebulization BID   Chlorhexidine  Gluconate Cloth  6 each Topical Daily   clonazePAM   1 mg Oral q morning   And   clonazePAM   2 mg Oral QHS   DULoxetine   120 mg Oral QHS   fenofibrate   160 mg Oral Daily   gabapentin   300 mg Oral BID   heparin   5,000 Units Subcutaneous Q8H   insulin  aspart  0-20 Units Subcutaneous Q4H   insulin  glargine-yfgn  10 Units Subcutaneous BID   metoprolol  tartrate  25 mg Oral BID   omega-3 acid ethyl esters  2 g Oral BID   pantoprazole   40 mg Oral Daily   potassium chloride   40 mEq Oral Once   ranolazine   500 mg Oral BID   rosuvastatin   10 mg Oral QHS   traZODone   100 mg Oral QHS   Continuous Infusions:   LOS: 7 days   Ivonne Mustache, MD [facility] P2/11/2024, 8:07 AM  "

## 2024-08-30 NOTE — Care Management Important Message (Signed)
 Important Message  Patient Details  Name: Jamie [name] MRN: [id number] Date of Birth: 26-May-1971   Important Message Given:  Yes - Medicare IM     Claretta Deed 08/30/2024, 2:34 PM

## 2024-08-30 NOTE — Plan of Care (Signed)
 " Problem: Education: Goal: Knowledge of General Education information will improve Description: Including pain rating scale, medication(s)/side effects and non-pharmacologic comfort measures Outcome: Progressing   Problem: Health Behavior/Discharge Planning: Goal: Ability to manage health-related needs will improve Outcome: Progressing   Problem: Clinical Measurements: Goal: Ability to maintain clinical measurements within normal limits will improve Outcome: Progressing Goal: Will remain free from infection Outcome: Progressing Goal: Diagnostic test results will improve Outcome: Progressing Goal: Respiratory complications will improve Outcome: Progressing Goal: Cardiovascular complication will be avoided Outcome: Progressing   Problem: Activity: Goal: Risk for activity intolerance will decrease Outcome: Progressing   Problem: Nutrition: Goal: Adequate nutrition will be maintained Outcome: Progressing   Problem: Coping: Goal: Level of anxiety will decrease Outcome: Progressing   Problem: Elimination: Goal: Will not experience complications related to bowel motility Outcome: Progressing Goal: Will not experience complications related to urinary retention Outcome: Progressing   Problem: Pain Managment: Goal: General experience of comfort will improve and/or be controlled Outcome: Progressing   Problem: Safety: Goal: Ability to remain free from injury will improve Outcome: Progressing   Problem: Skin Integrity: Goal: Risk for impaired skin integrity will decrease Outcome: Progressing   Problem: Education: Goal: Ability to describe self-care measures that may prevent or decrease complications (Diabetes Survival Skills Education) will improve Outcome: Progressing Goal: Individualized Educational Video(s) Outcome: Progressing   Problem: Coping: Goal: Ability to adjust to condition or change in health will improve Outcome: Progressing   Problem: Fluid  Volume: Goal: Ability to maintain a balanced intake and output will improve Outcome: Progressing   Problem: Health Behavior/Discharge Planning: Goal: Ability to identify and utilize available resources and services will improve Outcome: Progressing Goal: Ability to manage health-related needs will improve Outcome: Progressing   Problem: Metabolic: Goal: Ability to maintain appropriate glucose levels will improve Outcome: Progressing   Problem: Nutritional: Goal: Maintenance of adequate nutrition will improve Outcome: Progressing Goal: Progress toward achieving an optimal weight will improve Outcome: Progressing   Problem: Skin Integrity: Goal: Risk for impaired skin integrity will decrease Outcome: Progressing   Problem: Tissue Perfusion: Goal: Adequacy of tissue perfusion will improve Outcome: Progressing   Problem: Education: Goal: Ability to describe self-care measures that may prevent or decrease complications (Diabetes Survival Skills Education) will improve Outcome: Progressing Goal: Individualized Educational Video(s) Outcome: Progressing   Problem: Cardiac: Goal: Ability to maintain an adequate cardiac output will improve Outcome: Progressing   Problem: Health Behavior/Discharge Planning: Goal: Ability to identify and utilize available resources and services will improve Outcome: Progressing Goal: Ability to manage health-related needs will improve Outcome: Progressing   Problem: Fluid Volume: Goal: Ability to achieve a balanced intake and output will improve Outcome: Progressing   Problem: Metabolic: Goal: Ability to maintain appropriate glucose levels will improve Outcome: Progressing   Problem: Nutritional: Goal: Maintenance of adequate nutrition will improve Outcome: Progressing Goal: Maintenance of adequate weight for body size and type will improve Outcome: Progressing   Problem: Respiratory: Goal: Will regain and/or maintain adequate  ventilation Outcome: Progressing   Problem: Urinary Elimination: Goal: Ability to achieve and maintain adequate renal perfusion and functioning will improve Outcome: Progressing   Problem: Education: Goal: Knowledge of Pancreatitis treatment and prevention will improve Outcome: Progressing   Problem: Health Behavior/Discharge Planning: Goal: Ability to formulate a plan to maintain an alcohol -free life will improve Outcome: Progressing   Problem: Nutritional: Goal: Ability to achieve adequate nutritional intake will improve Outcome: Progressing   Problem: Clinical Measurements: Goal: Complications related to the disease process, condition or treatment  will be avoided or minimized Outcome: Progressing   "

## 2024-08-31 ENCOUNTER — Inpatient Hospital Stay (HOSPITAL_COMMUNITY)

## 2024-08-31 LAB — CBC WITH DIFFERENTIAL/PLATELET
Abs Immature Granulocytes: 0.32 10*3/uL — ABNORMAL HIGH (ref 0.00–0.07)
Basophils Absolute: 0.1 10*3/uL (ref 0.0–0.1)
Basophils Relative: 1 %
Eosinophils Absolute: 0 10*3/uL (ref 0.0–0.5)
Eosinophils Relative: 0 %
HCT: 35.8 % — ABNORMAL LOW (ref 36.0–46.0)
Hemoglobin: 11.7 g/dL — ABNORMAL LOW (ref 12.0–15.0)
Immature Granulocytes: 4 %
Lymphocytes Relative: 11 %
Lymphs Abs: 0.9 10*3/uL (ref 0.7–4.0)
MCH: 28.4 pg (ref 26.0–34.0)
MCHC: 32.7 g/dL (ref 30.0–36.0)
MCV: 86.9 fL (ref 80.0–100.0)
Monocytes Absolute: 0.5 10*3/uL (ref 0.1–1.0)
Monocytes Relative: 6 %
Neutro Abs: 6.5 10*3/uL (ref 1.7–7.7)
Neutrophils Relative %: 78 %
Platelets: 144 10*3/uL — ABNORMAL LOW (ref 150–400)
RBC: 4.12 MIL/uL (ref 3.87–5.11)
RDW: 15.9 % — ABNORMAL HIGH (ref 11.5–15.5)
Smear Review: NORMAL
WBC: 8.3 10*3/uL (ref 4.0–10.5)
nRBC: 0 % (ref 0.0–0.2)

## 2024-08-31 LAB — COMPREHENSIVE METABOLIC PANEL WITH GFR
ALT: 21 U/L (ref 0–44)
AST: 22 U/L (ref 15–41)
Albumin: 2.5 g/dL — ABNORMAL LOW (ref 3.5–5.0)
Alkaline Phosphatase: 136 U/L — ABNORMAL HIGH (ref 38–126)
Anion gap: 8 (ref 5–15)
BUN: 16 mg/dL (ref 6–20)
CO2: 31 mmol/L (ref 22–32)
Calcium: 8.8 mg/dL — ABNORMAL LOW (ref 8.9–10.3)
Chloride: 95 mmol/L — ABNORMAL LOW (ref 98–111)
Creatinine, Ser: 0.73 mg/dL (ref 0.44–1.00)
GFR, Estimated: >60 mL/min
Glucose, Bld: 165 mg/dL — ABNORMAL HIGH (ref 70–99)
Potassium: 4.1 mmol/L (ref 3.5–5.1)
Sodium: 134 mmol/L — ABNORMAL LOW (ref 135–145)
Total Bilirubin: 0.7 mg/dL (ref 0.0–1.2)
Total Protein: 5.8 g/dL — ABNORMAL LOW (ref 6.5–8.1)

## 2024-08-31 LAB — GLUCOSE, CAPILLARY
Glucose-Capillary: 166 mg/dL — ABNORMAL HIGH (ref 70–99)
Glucose-Capillary: 139 mg/dL — ABNORMAL HIGH (ref 70–99)
Glucose-Capillary: 149 mg/dL — ABNORMAL HIGH (ref 70–99)
Glucose-Capillary: 134 mg/dL — ABNORMAL HIGH (ref 70–99)
Glucose-Capillary: 149 mg/dL — ABNORMAL HIGH (ref 70–99)

## 2024-08-31 LAB — PRO BRAIN NATRIURETIC PEPTIDE: Pro Brain Natriuretic Peptide: 123 pg/mL

## 2024-08-31 LAB — D-DIMER, QUANTITATIVE: D-Dimer, Quant: 5.18 ug{FEU}/mL — ABNORMAL HIGH (ref 0.00–0.50)

## 2024-08-31 MED ORDER — IOHEXOL 350 MG/ML SOLN
75.0000 mL | Freq: Once | INTRAVENOUS | Status: AC | PRN
  Administered 2024-08-31: 75 mL via INTRAVENOUS

## 2024-08-31 NOTE — Progress Notes (Signed)
 " PROGRESS NOTE  TIA HIERONYMUS  FMW:[id number] DOB: 08-Aug-1970 DOA: 08/23/2024 PCP: Jolee Greig DEL, PA-C   Brief Narrative: Patient is a 54 year old female with history of hypertension, hyperlipidemia, OSA on CPAP, recurrent pancreatitis secondary to hypertriglyceridemia, type 2 diabetes, bipolar 1 disorder, anxiety who was transferred from [facility] for further management.  She presented there with acute pancreatitis, early signs of necrosis and was sent here.  On plantation, she was tachypneic, tachycardic, hypertensive.  Lab work showed a CO2 of 17, elevated blood glucose, anion gap of 22, lipase of 262, triglyceride of 315.  While undergoing MRCP here on 1/30, she became lethargic after getting 0.5 mg Dilaudid  and had to be put on BiPAP.  She remained encephalopathic so PCCM was consulted and was transferred to ICU, briefly required Precedex  drip.  Respiratory status has improved.  Currently on  nasal cannula.  Patient transferred to TRH service on 2/5. Echo showed diminished EF, cardiology consulted  Significant events:  1/30 presented from outside hospital for ongoing management at severe pancreatitis, and DKA on arrival 1/31 progressive hypoxic and hypercapnic respiratory failure requiring BiPAP dependence prompting PCCM consult and transfer to ICU  Assessment & Plan:  Principal Problem:   Acute pancreatitis Active Problems:   DM2 (diabetes mellitus, type 2) (HCC)   OSA on CPAP   GERD (gastroesophageal reflux disease)   Hypertriglyceridemia without hypercholesterolemia   Essential hypertension   Chronic pancreatitis (HCC)   CAD (coronary artery disease)   Generalized abdominal pain   Bilious vomiting with nausea   Acute recurrent pancreatitis   Acute hypoxemic respiratory failure (HCC)   Acute heart failure with mildly reduced ejection fraction (HFmrEF) (HCC)   Acute hypoxic/hypercapnic respiratory failure/history of sleep apnea: Became encephalopathic/somnolent after  giving Dilaudid .  Initially required BiPAP.  Now she has been weaned to nasal cannula 2L.  Continue to wean.  Continue aspiration precaution.   Continue bronchodilators as needed. She feels better today.  D-dimer elevated.  Checking CT angiogram PE protocol.  Lower extremity Doppler was negative for DVT  Acute pancreatitis with early signs of necrosis due to hypertriglyceridemia: CT abdomen/pelvis showed marked peripancreatic edema, induration suggesting acute pancreatitis with subtle signs of necrosis.  GI  were following.  Currently denies abdominal pain.  Abdomen is benign examination.  Tolerating diet.  Advanced to soft  Hypertriglyceridemia: She takes fenofibrate , Crestor .  Should continue  Acute HFrEF:  Echo done around 2/26 showed EF of 40 to 45%, wall motion abnormality, mild right ventricular dysfunction.  Her echo on February 24, 2023 showed EF of 60 to 65%, normal symptomatic, normal right ventricular function.  Cardiology consulted. Given a dose of IV Lasix  and 2/5.  No peripheral edema but has crackles bilaterally on bases  Type 2 diabetes/DKA: Came with DKA.  DKA is resolved.  Currently on sliding scale and long-acting insulin .  Continue to monitor.  Last A1c of 12.3 as per 07/30/2024.  Being followed by diabetic coordinator  Hypokalemia: Resolved  Hypertension: Home meds on hold.  Currently normotensive  History of coronary artery disease: No anginal symptoms.  No chest pain.  On ranolazine , metoprolol   Depression/anxiety: On trazodone , Abilify , duloxetine , Klonopin   Peripheral neuropathy: Gabapentin  300 mg twice daily at home  GERD: Continue PPI  OSA: Continue CPAP at bedtime.  She has severe machine at home but not sure she was compliant  Morbid obesity: Weight of 109 kg  Debility/deconditioning: Patient is from home.  Lived with her husband.  PT/OT consulted  DVT prophylaxis:heparin  injection 5,000 Units Start: 08/24/24 0600 SCDs Start: 08/24/24 0124     Code  Status: Full Code  Family Communication:Called and discussed with husband on Larry on phone on 2/6  Patient status:Inpatient  Patient is from :Home  Anticipated discharge un:ynfz vs SNF  Estimated DC date:2-3 days   Consultants: GI,pccm,cardiology  Procedures:None  Antimicrobials:  Anti-infectives (From admission, onward)    None       Subjective: Patient seen and examined at bedside today.  She feels better today.  She was sitting on the chair.  On 2 L of oxygen  per minute.  Denied any dyspnea like yesterday.  No lower extremity edema.  Still has bilateral basilar crackles.  Objective: Vitals:   08/31/24 0015 08/31/24 0500 08/31/24 0700 08/31/24 0851  BP: 121/74 137/81 118/64   Pulse: (!) 103 (!) 109 (!) 108   Resp: 19 19 (!) 22   Temp: 97.9 F (36.6 C) 98 F (36.7 C) 99.9 F (37.7 C)   TempSrc: Oral Oral Oral   SpO2: 97% 98% 96% 98%  Weight:      Height:        Intake/Output Summary (Last 24 hours) at 08/31/2024 1147 Last data filed at 08/30/2024 1817 Gross per 24 hour  Intake 240 ml  Output 1000 ml  Net -760 ml   Filed Weights   08/23/24 1940 08/23/24 2200  Weight: 109 kg 109 kg    Examination:   General exam: Overall comfortable, not in distress, morbidly obese HEENT: PERRL Respiratory system: Fine crackles on bilateral bases Cardiovascular system: S1 & S2 heard, RRR.  Gastrointestinal system: Abdomen is nondistended, soft and nontender. Central nervous system: Alert and oriented Extremities: No edema, no clubbing ,no cyanosis Skin: No rashes, no ulcers,no icterus      Data Reviewed: I have personally reviewed following labs and imaging studies  CBC: Recent Labs  Lab 08/27/24 0015 08/28/24 0206 08/29/24 1229 08/30/24 0201 08/31/24 0216  WBC 4.7 8.2 8.7 8.4 8.3  NEUTROABS 3.4 6.8 7.3 7.1 6.5  HGB 11.1* 12.2 12.5 12.8 11.7*  HCT 34.9* 37.4 39.2 39.0 35.8*  MCV 89.9 88.6 89.9 86.9 86.9  PLT 122* 125* 114* 126* 144*   Basic Metabolic  Panel: Recent Labs  Lab 08/25/24 0928 08/25/24 2145 08/26/24 0705 08/27/24 0015 08/28/24 0206 08/29/24 1229 08/30/24 0201 08/31/24 0216  NA 140  --    < > 142 138 137 134* 134*  K 4.4  --    < > 3.2* 3.5 3.5 3.4* 4.1  CL 104  --    < > 102 99 95* 95* 95*  CO2 23  --    < > 27 26 32 30 31  GLUCOSE 193*  --    < > 213* 170* 229* 204* 165*  BUN 27*  --    < > 31* 19 19 14 16   CREATININE 0.78  --    < > 0.79 0.63 0.59 0.50 0.73  CALCIUM  8.2*  --    < > 8.4* 8.3* 8.8* 9.0 8.8*  MG 2.4  --   --   --   --   --   --   --   PHOS 1.4* 2.6  --   --   --   --   --   --    < > = values in this interval not displayed.     Recent Results (from the past 240 hours)  Surgical PCR screen     Status:  Abnormal   Collection Time: 08/23/24  8:00 PM   Specimen: Nasal Mucosa; Nasal Swab  Result Value Ref Range Status   MRSA, PCR NEGATIVE NEGATIVE Final   Staphylococcus aureus POSITIVE (A) NEGATIVE Final    Comment: (NOTE) The Xpert SA Assay (FDA approved for NASAL specimens in patients 45 years of age and older), is one component of a comprehensive surveillance program. It is not intended to diagnose infection nor to guide or monitor treatment. Performed at [facility], [address] [place], [place] [address]      Radiology Studies: DG Chest 2 View Result Date: 08/30/2024 CLINICAL DATA:  Short of breath EXAM: CHEST - 2 VIEW COMPARISON:  08/25/2024 FINDINGS: Frontal and lateral views of the chest demonstrates stable enlargement of the cardiac silhouette. Low lung volumes. Veiling opacity at the left lung base consistent with left basilar consolidation and/or small pleural effusion. Right chest is clear. No pneumothorax. IMPRESSION: 1. Veiling opacity left lung base consistent with left basilar consolidation and/or small effusion. 2. Low lung volumes. 3. Stable enlarged cardiac silhouette. Electronically Signed   By: Ozell Daring M.D.   On: 08/30/2024 18:41    Scheduled Meds:   arformoterol   15 mcg Nebulization BID   ARIPiprazole   20 mg Oral Daily   budesonide  (PULMICORT ) nebulizer solution  0.5 mg Nebulization BID   Chlorhexidine  Gluconate Cloth  6 each Topical Daily   clonazePAM   1 mg Oral q morning   And   clonazePAM   2 mg Oral QHS   DULoxetine   120 mg Oral QHS   fenofibrate   160 mg Oral Daily   gabapentin   300 mg Oral BID   heparin   5,000 Units Subcutaneous Q8H   insulin  aspart  0-20 Units Subcutaneous Q4H   insulin  glargine-yfgn  12 Units Subcutaneous BID   living well with diabetes book   Does not apply Once   metoprolol  tartrate  25 mg Oral BID   omega-3 acid ethyl esters  2 g Oral BID   pantoprazole   40 mg Oral Daily   ranolazine   500 mg Oral BID   rosuvastatin   10 mg Oral QHS   traZODone   100 mg Oral QHS   Continuous Infusions:   LOS: 8 days   Ivonne Mustache, MD [facility] P2/12/2024, 11:47 AM  "

## 2024-08-31 NOTE — Evaluation (Signed)
 Physical Therapy Evaluation Patient Details Name: Jamie [name] MRN: [id number] DOB: 10/21/1970 Today's Date: 08/31/2024  History of Present Illness  Pt is a 54 y.o female presented to [facility] 1/29 with severe abdominal pain. CT scan shows acute pancreatitis with early signs of necrosis, transferred to [facility] for further management. Became hypoxic, transferred to ICU and placed on Bipap 1/31. Chest x-ray showed possible small pleural effusions.  PMH: DM, HTN,  recurrent pancreatitis, bipolar, HLD  Clinical Impression  Pt presents with admitting diagnosis above. Pt today was able to ambulate short distance in room with RW CGA however required 2 seated rest breaks due to fatigue. PTA pt reports that she typically ambulates with no AD around the house and uses WC out in community. Patient will benefit from continued inpatient follow up therapy, <3 hours/day. PT will continue to follow.          If plan is discharge home, recommend the following: A little help with bathing/dressing/bathroom;A lot of help with walking and/or transfers;Assistance with cooking/housework;Assist for transportation;Help with stairs or ramp for entrance;Supervision due to cognitive status   Can travel by private vehicle   Yes    Equipment Recommendations Rolling walker (2 wheels);BSC/3in1 Cipriano)  Recommendations for Other Services       Functional Status Assessment Patient has had a recent decline in their functional status and demonstrates the ability to make significant improvements in function in a reasonable and predictable amount of time.     Precautions / Restrictions Precautions Precautions: Fall Recall of Precautions/Restrictions: Impaired Restrictions Weight Bearing Restrictions Per Provider Order: No      Mobility  Bed Mobility               General bed mobility comments: Up in recliner upon arrival.    Transfers Overall transfer level: Needs assistance Equipment used: Rolling  walker (2 wheels) Transfers: Sit to/from Stand Sit to Stand: Contact guard assist           General transfer comment: Cues for hand placement.    Ambulation/Gait Ambulation/Gait assistance: Contact guard assist, +2 safety/equipment (Chair follow) Gait Distance (Feet): 20 Feet (10+10) Assistive device: Rolling walker (2 wheels) Gait Pattern/deviations: Step-to pattern, Wide base of support, Decreased stride length, Trunk flexed Gait velocity: decreased Gait velocity interpretation: <1.31 ft/sec, indicative of household ambulator   General Gait Details: 2 bouts of ambulation. PT with short step to gait pattern with RW CGA. Fatigues very quickly and required chair follow. 2 seated rest breaks.  Stairs            Wheelchair Mobility     Tilt Bed    Modified Rankin (Stroke Patients Only)       Balance Overall balance assessment: Needs assistance Sitting-balance support: Feet supported, No upper extremity supported Sitting balance-Leahy Scale: Fair     Standing balance support: Bilateral upper extremity supported, During functional activity, Reliant on assistive device for balance Standing balance-Leahy Scale: Poor Standing balance comment: Reliant on RW                             Pertinent Vitals/Pain Pain Assessment Pain Assessment: No/denies pain    Home Living Family/patient expects to be discharged to:: Private residence Living Arrangements: Spouse/significant other Available Help at Discharge: Family;Available PRN/intermittently Type of Home: House Home Access: Level entry       Home Layout: One level Home Equipment: Shower seat;Wheelchair - manual      Prior  Function Prior Level of Function : Independent/Modified Independent             Mobility Comments: Ind, reports no AD in house, w/c in community ADLs Comments: Ind     Extremity/Trunk Assessment   Upper Extremity Assessment Upper Extremity Assessment: Generalized  weakness    Lower Extremity Assessment Lower Extremity Assessment: Generalized weakness    Cervical / Trunk Assessment Cervical / Trunk Assessment: Kyphotic  Communication   Communication Communication: No apparent difficulties    Cognition Arousal: Lethargic Behavior During Therapy: Flat affect                           PT - Cognition Comments: Very flat affect with pt falling asleep during subjective. Following commands: Impaired Following commands impaired: Follows multi-step commands with increased time     Cueing Cueing Techniques: Verbal cues     General Comments General comments (skin integrity, edema, etc.): SpO2 93% on 2L    Exercises     Assessment/Plan    PT Assessment Patient needs continued PT services  PT Problem List Decreased strength;Decreased range of motion;Decreased activity tolerance;Decreased balance;Decreased mobility;Decreased cognition;Decreased coordination;Decreased knowledge of use of DME;Decreased knowledge of precautions;Decreased safety awareness;Cardiopulmonary status limiting activity       PT Treatment Interventions DME instruction;Gait training;Functional mobility training;Stair training;Therapeutic exercise;Therapeutic activities;Balance training;Neuromuscular re-education;Cognitive remediation;Patient/family education    PT Goals (Current goals can be found in the Care Plan section)  Acute Rehab PT Goals Patient Stated Goal: to get better PT Goal Formulation: With patient Time For Goal Achievement: 09/14/24 Potential to Achieve Goals: Good    Frequency Min 3X/week     Co-evaluation               AM-PAC PT 6 Clicks Mobility  Outcome Measure Help needed turning from your back to your side while in a flat bed without using bedrails?: A Little Help needed moving from lying on your back to sitting on the side of a flat bed without using bedrails?: A Little Help needed moving to and from a bed to a chair  (including a wheelchair)?: A Little Help needed standing up from a chair using your arms (e.g., wheelchair or bedside chair)?: A Little Help needed to walk in hospital room?: A Lot Help needed climbing 3-5 steps with a railing? : Total 6 Click Score: 15    End of Session Equipment Utilized During Treatment: Gait belt;Oxygen  Activity Tolerance: Patient tolerated treatment well Patient left: in chair;with call bell/phone within reach;with chair alarm set Nurse Communication: Mobility status PT Visit Diagnosis: Other abnormalities of gait and mobility (R26.89)    Time: 8993-8976 PT Time Calculation (min) (ACUTE ONLY): 17 min   Charges:   PT Evaluation $PT Eval Moderate Complexity: 1 Mod   PT General Charges $$ ACUTE PT VISIT: 1 Visit         Jamie [name], PT, DPT Acute Rehab Services [phone]   Jamie [name] 08/31/2024, 12:15 PM

## 2024-08-31 NOTE — Progress Notes (Addendum)
 "  Progress Note  Patient Name: Jamie [name] Date of Encounter: 08/31/2024 [facility] Cardiologist: Vishnu P Mallipeddi, MD   Interval Summary   Hungry. Abdominal pain improving.  Denied chest pain, or worsening SOB. Some palpitations.   Vital Signs Vitals:   08/31/24 0015 08/31/24 0500 08/31/24 0700 08/31/24 0851  BP: 121/74 137/81 118/64   Pulse: (!) 103 (!) 109 (!) 108   Resp: 19 19 (!) 22   Temp: 97.9 F (36.6 C) 98 F (36.7 C) 99.9 F (37.7 C)   TempSrc: Oral Oral Oral   SpO2: 97% 98% 96% 98%  Weight:      Height:        Intake/Output Summary (Last 24 hours) at 08/31/2024 1026 Last data filed at 08/30/2024 1817 Gross per 24 hour  Intake 240 ml  Output 1000 ml  Net -760 ml      08/23/2024   10:00 PM 08/23/2024    7:40 PM 08/15/2024    2:07 PM  Last 3 Weights  Weight (lbs) 240 lb 4.8 oz 240 lb 4.8 oz   Weight (kg) 109 kg 109 kg      Information is confidential and restricted. Go to Review Flowsheets to unlock data.      Telemetry/ECG  ST HR 105 - Personally Reviewed  Physical Exam  GEN: No acute distress.   Neck: JVD hard to assess with body habitus Cardiac: regular rhythm, tachycardic, no murmurs, rubs, or gallops.  Respiratory: Clear to auscultation bilaterally though diminished bilaterally MS: No edema Skin:warm and dry  Assessment & Plan  Jamie [name] is a 54 y.o. female with a hx of nonobstructive CAD by cath 09/2023 (30% mLAD), mild LV dysfunction by cath 09/2023 with suspected chronic heart failure (?HFmrEF), HTN, HLD, migraines, anxiety, arthritis, DM2, PCOS, hiatal hernia, GERD, diverticulosis, arthritis, anxiety, hepatic steatosis, recurrent pancreatitis with hypertriglyceridemia, prior cholecystectomy, OSA on CPAP, depression, anxiety, peripheral neuropathy, remote intermittent thrombocytopenia who presented to [facility] with abdominal pain, diagnosed with acute pancreatitis and DKA. She received IV fluids and was transferred to  [facility]. Upon arrival was mildly tachypneic, tachycardic and hypertensive. Required BiPAP. CXR showed mild volume overload, and she received IV lasix .   Acute on chronic HFmrEF Echo this admission shows LVEF 45-50% with global hypokinesis.  LHC 09/2023 showed mild nonobstructive CAD. Holding off on ischemic evaluation for EF drop as concern for coronary etiology is low, recommendation is to pursue medical management with follow up echocardiogram to assess for EF recovery.   Received one dose of IV lasix  40 mg yesterday. 1L UOP. No recent weight obtained, will reach out to RN ProBNP 123 On exam,  does not appear to be volume up even with severe hypoalbuminemia [2.5]. Will hold on diuresis today. Continue PE work-up as below for oxygen  requirement.   GDMT has been limited by hypotension, though improving: BP 118/64 Continue lopressor  25 mg BID will need to consolidate to succinate prior to dc Start cozaar tomorrow  Pending BP can maybe start MRA prior to DC SGLT2i deferred with acute pancreatitis  Sinus tachycardia HR~105 Echo this admission does show mild RV dysfunction, which could be explained by her OSA and suspected OHS, however with ST, hypoxia, and elevated d-dimer agree with pursuing CTA to rule out PE.    CAD Per [facility] 09/2023 mLAD stenosis ~30%. Suspected chest pain was 2/2 microvascular angina. Denied chest pain, troponin negative this admission.   Not on ASA 2/2 non-occlusive disease & history of thrombocytopenia though  PLT >100. If not need for anticoagulation, may benefit from starting ASA with PLT monitoring.  BB as above  Continue ranexa  500 mg BID  Hyperlipidemia Hypertriglyceridemia  TG this admission 315 Continue Crestor  10 mg Continue Fenofibrate  160 mg Continue Lovaza  2 g BID  OSA  On CPAP  Otherwise per primary  For questions or updates, please contact [facility] Please consult [email or web address] for contact info under       Signed, Leontine LOISE Salen,  PA-C   "

## 2024-08-31 NOTE — TOC PASRR Note (Cosign Needed)
 30 Day PASRR Note   Patient Details  Name: Jamie [name] Date of Birth: 07-03-1971   Transition of Care [facility] CM/SW Contact:    Almarie CHRISTELLA Goodie, LCSW Phone Number: 08/31/2024, 2:58 PM  To Whom It May Concern:  Please be advised that this patient will require a short-term nursing home stay - anticipated 30 days or less for rehabilitation and strengthening.   The plan is for return home.

## 2024-08-31 NOTE — Progress Notes (Signed)
" °   08/31/24 0004  BiPAP/CPAP/SIPAP  $ Non-Invasive Home Ventilator  Initial  $ Face Mask Medium Yes  BiPAP/CPAP/SIPAP Pt Type Adult  BiPAP/CPAP/SIPAP SERVO  Mask Type Full face mask  Mask Size Medium  Respiratory Rate 18 breaths/min  PEEP 5 cmH20  Flow Rate 2 lpm  Patient Home Machine No  Patient Home Mask No  Patient Home Tubing No  Auto Titrate No  Device Plugged into RED Power Outlet Yes  BiPAP/CPAP /SiPAP Vitals  Resp (!) 25  MEWS Score/Color  MEWS Score 3  MEWS Score Color Yellow    "

## 2024-08-31 NOTE — [place] FL2 (Cosign Needed)
 " [place]  MEDICAID FL2 LEVEL OF CARE FORM     IDENTIFICATION  Patient Name: Jamie [name] Birthdate: 06-01-1971 Sex: female Admission Date (Current Location): 08/23/2024  [facility] and [place] Number:  [employer] and Address:  [facility] [place], [address], [place], [place] [address]      Provider Number: [id number]  Attending Physician Name and Address:  Jillian Buttery, MD  Relative Name and Phone Number:       Current Level of Care: Hospital Recommended Level of Care: Skilled Nursing Facility Prior Approval Number:    Date Approved/Denied:   PASRR Number: Manual review  Discharge Plan: SNF    Current Diagnoses: Patient Active Problem List   Diagnosis Date Noted   Acute heart failure with mildly reduced ejection fraction (HFmrEF) (HCC) 08/30/2024   Acute hypoxemic respiratory failure (HCC) 08/28/2024   CAD (coronary artery disease) 08/24/2024   Generalized abdominal pain 08/24/2024   Bilious vomiting with nausea 08/24/2024   Acute recurrent pancreatitis 08/24/2024   ERRONEOUS ENCOUNTER--DISREGARD 09/16/2023   Cardiac chest pain 08/10/2023   Asthmatic bronchitis , chronic (HCC) 06/28/2023   DOE (dyspnea on exertion) 06/28/2023   Atypical chest pain 04/26/2023   Hypertriglyceridemia without hypercholesterolemia 01/28/2023   DM2 (diabetes mellitus, type 2) (HCC) 01/26/2023   Acute pancreatitis 01/26/2023   Chronic diastolic (congestive) heart failure (HCC) 05/04/2022   Class 2 obesity with body mass index (BMI) of 38.0 to 38.9 in adult 05/04/2022   Chronic pancreatitis (HCC) 05/03/2022   Xerostomia 11/06/2020   Pneumonia due to COVID-19 virus 09/06/2020   GERD (gastroesophageal reflux disease) 03/20/2020   Dysphagia 03/20/2020   Wheezing 02/22/2020   Prolapsed internal hemorrhoids, grade 4 12/05/2019   Abdominal pain, chronic, epigastric 09/18/2019   Chronic diarrhea 06/12/2019   Common migraine 11/03/2016   OSA on CPAP  11/03/2016   Essential hypertension 02/01/2014   Lumbar herniated disc 01/22/2014   Headache 12/06/2012   Bipolar I disorder, severe, current or most recent episode depressed, with psychotic features (HCC) 11/15/2012   Severe major depression with psychotic features, mood-congruent (HCC) 05/03/2012   Generalized anxiety disorder 06/09/2011   Gastroesophageal reflux disease 12/22/2010    Orientation RESPIRATION BLADDER Height & Weight     Self, Time, Situation, Place  O2, Other (Comment) ([place] 3L; CPAP at night (has home unit)) Continent Weight: 240 lb 4.8 oz (109 kg) Height:  5' 3 (160 cm)  BEHAVIORAL SYMPTOMS/MOOD NEUROLOGICAL BOWEL NUTRITION STATUS      Continent Diet (see DC summary)  AMBULATORY STATUS COMMUNICATION OF NEEDS Skin   Extensive Assist Verbally Normal                       Personal Care Assistance Level of Assistance  Bathing, Feeding, Dressing Bathing Assistance: Limited assistance Feeding assistance: Independent Dressing Assistance: Limited assistance     Functional Limitations Info  Sight Sight Info: Impaired        SPECIAL CARE FACTORS FREQUENCY  PT (By licensed PT), OT (By licensed OT)     PT Frequency: 5x/wk OT Frequency: 5x/wk            Contractures Contractures Info: Not present    Additional Factors Info  Code Status, Allergies, Psychotropic Code Status Info: Full Allergies Info: Codeine, Ozempic (0.25 Or 0.5 Mg-dose) (Semaglutide(0.25 Or 0.5mg -dos)), Morphine  And Codeine Psychotropic Info: Abilify  20mg  daily; Klonopin  1mg  daily in the morning and 2mg  daily at bed; Cymbalta  120mg  daily at bed  Current Medications (08/31/2024):  This is the current hospital active medication list Current Facility-Administered Medications  Medication Dose Route Frequency Provider Last Rate Last Admin   acetaminophen  (TYLENOL ) tablet 650 mg  650 mg Oral Q6H PRN Arloa Folks D, NP   650 mg at 08/27/24 9476   arformoterol  (BROVANA ) nebulizer  solution 15 mcg  15 mcg Nebulization BID Claudene Toribio BROCKS, MD   15 mcg at 08/31/24 9152   ARIPiprazole  (ABILIFY ) tablet 20 mg  20 mg Oral Daily Dewald, Jonathan B, MD   20 mg at 08/31/24 9157   budesonide  (PULMICORT ) nebulizer solution 0.5 mg  0.5 mg Nebulization BID Claudene Toribio BROCKS, MD   0.5 mg at 08/31/24 9152   Chlorhexidine  Gluconate Cloth 2 % PADS 6 each  6 each Topical Daily Kara Dorn NOVAK, MD   6 each at 08/31/24 9155   clonazePAM  (KLONOPIN ) tablet 1 mg  1 mg Oral q morning Dewald, Jonathan B, MD   1 mg at 08/31/24 9156   And   clonazePAM  (KLONOPIN ) tablet 2 mg  2 mg Oral QHS Kara Dorn B, MD   2 mg at 08/30/24 2128   dextrose  50 % solution 0-50 mL  0-50 mL Intravenous PRN Raenelle Donalda HERO, MD       DULoxetine  (CYMBALTA ) DR capsule 120 mg  120 mg Oral QHS Kara Dorn B, MD   120 mg at 08/30/24 2127   fenofibrate  tablet 160 mg  160 mg Oral Daily Franky Redia SAILOR, MD   160 mg at 08/31/24 9156   fentaNYL  (SUBLIMAZE ) injection 12.5 mcg  12.5 mcg Intravenous Q4H PRN Raenelle Donalda HERO, MD       gabapentin  (NEURONTIN ) capsule 300 mg  300 mg Oral BID Dewald, Jonathan B, MD   300 mg at 08/31/24 0843   heparin  injection 5,000 Units  5,000 Units Subcutaneous Q8H Franky Redia SAILOR, MD   5,000 Units at 08/31/24 1240   hydrALAZINE  (APRESOLINE ) injection 10 mg  10 mg Intravenous Q6H PRN Raenelle Donalda HERO, MD       insulin  aspart (novoLOG ) injection 0-20 Units  0-20 Units Subcutaneous Q4H Mansy, Jan A, MD   3 Units at 08/31/24 1241   insulin  glargine-yfgn injection 12 Units  12 Units Subcutaneous BID Jillian Buttery, MD   12 Units at 08/31/24 0841   liver oil-zinc  oxide (DESITIN) 40 % ointment   Topical Q8H PRN Yap, Vanessa, MD   Given at 08/27/24 0501   living well with diabetes book MISC   Does not apply Once Jillian Buttery, MD       loperamide  (IMODIUM ) capsule 2 mg  2 mg Oral PRN Ghimire, Shanker M, MD   2 mg at 08/27/24 1428   metoprolol  tartrate (LOPRESSOR ) tablet 25 mg  25  mg Oral BID Ghimire, Shanker M, MD   25 mg at 08/31/24 9156   naloxone  (NARCAN ) injection 0.4 mg  0.4 mg Intravenous PRN Raenelle Donalda HERO, MD       omega-3 acid ethyl esters (LOVAZA ) capsule 2 g  2 g Oral BID Kakrakandy, Arshad N, MD   2 g at 08/31/24 9157   pantoprazole  (PROTONIX ) EC tablet 40 mg  40 mg Oral Daily Kakrakandy, Arshad N, MD   40 mg at 08/31/24 9157   ranolazine  (RANEXA ) 12 hr tablet 500 mg  500 mg Oral BID Franky Redia SAILOR, MD   500 mg at 08/31/24 9157   rosuvastatin  (CRESTOR ) tablet 10 mg  10 mg Oral QHS Franky Redia SAILOR, MD  10 mg at 08/30/24 2127   traZODone  (DESYREL ) tablet 100 mg  100 mg Oral QHS Kara Dorn NOVAK, MD   100 mg at 08/30/24 2128     Discharge Medications: Please see discharge summary for a list of discharge medications.  Relevant Imaging Results:  Relevant Lab Results:   Additional Information SS#: [id number]  Almarie CHRISTELLA Goodie, LCSW     "

## 2024-08-31 NOTE — TOC Initial Note (Signed)
 Transition of Care [facility]) - Initial/Assessment Note    Patient Details  Name: Jamie [name] MRN: [id number] Date of Birth: [birth year]  Transition of Care [facility]) CM/SW Contact:    Almarie CHRISTELLA Goodie, LCSW Phone Number: 08/31/2024, 3:06 PM  Clinical Narrative:       CSW met with patient to discuss recommendation for SNF. Patient in agreement, hopeful for placement closer to [facility]. CSW completed referral and faxed out, will update with offers. Patient's PASRR is manual review, CSW to upload information to NCMust once MD has signed.            Expected Discharge Plan: Skilled Nursing Facility Barriers to Discharge: Continued Medical Work up, English As A Second Language Teacher, Engineer, Mining)   Patient Goals and CMS Choice Patient states their goals for this hospitalization and ongoing recovery are:: to get better and back home CMS Medicare.gov Compare Post Acute Care list provided to:: Patient Choice offered to / list presented to : Patient [place] ownership interest in [facility].provided to:: Patient    Expected Discharge Plan and Services     Post Acute Care Choice: Skilled Nursing Facility Living arrangements for the past 2 months: Single Family Home                                      Prior Living Arrangements/Services Living arrangements for the past 2 months: Single Family Home Lives with:: Spouse Patient language and need for interpreter reviewed:: No Do you feel safe going back to the place where you live?: Yes      Need for Family Participation in Patient Care: No (Comment) Care giver support system in place?: Yes (comment)   Criminal Activity/Legal Involvement Pertinent to Current Situation/Hospitalization: No - Comment as needed  Activities of Daily Living   ADL Screening (condition at time of admission) Independently performs ADLs?: Yes (appropriate for developmental age) Is the patient deaf or have difficulty hearing?: No Does the  patient have difficulty seeing, even when wearing glasses/contacts?: No Does the patient have difficulty concentrating, remembering, or making decisions?: No  Permission Sought/Granted Permission sought to share information with : Facility Industrial/product Designer granted to share information with : Yes, Verbal Permission Granted     Permission granted to share info w AGENCY: SNF        Emotional Assessment Appearance:: Appears stated age Attitude/Demeanor/Rapport: Engaged Affect (typically observed): Appropriate Orientation: : Oriented to Self, Oriented to Place, Oriented to  Time, Oriented to Situation Alcohol  / Substance Use: Not Applicable Psych Involvement: No (comment)  Admission diagnosis:  Pancreatic necrosis [K86.89] Acute pancreatitis [K85.90] Patient Active Problem List   Diagnosis Date Noted   Acute heart failure with mildly reduced ejection fraction (HFmrEF) (HCC) 08/30/2024   Acute hypoxemic respiratory failure (HCC) 08/28/2024   CAD (coronary artery disease) 08/24/2024   Generalized abdominal pain 08/24/2024   Bilious vomiting with nausea 08/24/2024   Acute recurrent pancreatitis 08/24/2024   ERRONEOUS ENCOUNTER--DISREGARD 09/16/2023   Cardiac chest pain 08/10/2023   Asthmatic bronchitis , chronic (HCC) 06/28/2023   DOE (dyspnea on exertion) 06/28/2023   Atypical chest pain 04/26/2023   Hypertriglyceridemia without hypercholesterolemia 01/28/2023   DM2 (diabetes mellitus, type 2) (HCC) 01/26/2023   Acute pancreatitis 01/26/2023   Chronic diastolic (congestive) heart failure (HCC) 05/04/2022   Class 2 obesity with body mass index (BMI) of 38.0 to 38.9 in adult 05/04/2022   Chronic pancreatitis (HCC) 05/03/2022  Xerostomia 11/06/2020   Pneumonia due to COVID-19 virus 09/06/2020   GERD (gastroesophageal reflux disease) 03/20/2020   Dysphagia 03/20/2020   Wheezing 02/22/2020   Prolapsed internal hemorrhoids, grade 4 12/05/2019   Abdominal pain,  chronic, epigastric 09/18/2019   Chronic diarrhea 06/12/2019   Common migraine 11/03/2016   OSA on CPAP 11/03/2016   Essential hypertension 02/01/2014   Lumbar herniated disc 01/22/2014   Headache 12/06/2012   Bipolar I disorder, severe, current or most recent episode depressed, with psychotic features (HCC) 11/15/2012   Severe major depression with psychotic features, mood-congruent (HCC) 05/03/2012   Generalized anxiety disorder 06/09/2011   Gastroesophageal reflux disease 12/22/2010   PCP:  Jolee Greig DEL, PA-C Pharmacy:   [facility] - [place], [place] - [address] [place] [address] Phone: [phone] Fax: [phone]     Social Drivers of Health (SDOH) Social History: SDOH Screenings   Food Insecurity: No Food Insecurity (08/23/2024)  Housing: Low Risk (08/23/2024)  Transportation Needs: No Transportation Needs (08/23/2024)  Utilities: Not At Risk (08/23/2024)  Depression (PHQ2-9): High Risk (01/17/2024)  Tobacco Use: Low Risk (08/24/2024)  Health Literacy: Low Risk (09/03/2021)   Received from [facility]   SDOH Interventions:     Readmission Risk Interventions    08/28/2024    4:29 PM  Readmission Risk Prevention Plan  Post Dischage Appt Complete  Medication Screening Complete  Transportation Screening Complete

## 2024-11-14 ENCOUNTER — Ambulatory Visit (HOSPITAL_COMMUNITY): Admitting: Psychiatry

## 2024-11-28 ENCOUNTER — Ambulatory Visit: Admitting: Nurse Practitioner

## 2024-12-03 ENCOUNTER — Ambulatory Visit: Admitting: Gastroenterology
